# Patient Record
Sex: Male | Born: 1965 | Race: White | Hispanic: Yes | Smoking: Current every day smoker
Health system: Southern US, Community
[De-identification: ages and names within clinical notes are randomized; demographics above are authoritative.]

## PROBLEM LIST (undated history)

## (undated) DIAGNOSIS — F1010 Alcohol abuse, uncomplicated: Secondary | ICD-10-CM

## (undated) HISTORY — PX: NASAL SEPTUM SURGERY: SHX37

## (undated) HISTORY — PX: WRIST SURGERY: SHX841

---

## 2020-06-19 ENCOUNTER — Emergency Department (HOSPITAL_COMMUNITY)
Admission: EM | Admit: 2020-06-19 | Discharge: 2020-06-20 | Disposition: A | Discharge status: Home / Self Care | Payer: No Typology Code available for payment source | Attending: Emergency Medicine | Admitting: Emergency Medicine

## 2020-06-19 ENCOUNTER — Ambulatory Visit (HOSPITAL_COMMUNITY)
Admission: AD | Admit: 2020-06-19 | Discharge: 2020-06-19 | Disposition: A | Discharge status: Home / Self Care | Payer: No Typology Code available for payment source | Source: Home / Self Care | Attending: Psychiatry | Admitting: Psychiatry

## 2020-06-19 ENCOUNTER — Other Ambulatory Visit: Payer: Self-pay

## 2020-06-19 DIAGNOSIS — R2681 Unsteadiness on feet: Secondary | ICD-10-CM | POA: Insufficient documentation

## 2020-06-19 DIAGNOSIS — F329 Major depressive disorder, single episode, unspecified: Secondary | ICD-10-CM | POA: Insufficient documentation

## 2020-06-19 DIAGNOSIS — F10120 Alcohol abuse with intoxication, uncomplicated: Secondary | ICD-10-CM | POA: Insufficient documentation

## 2020-06-19 DIAGNOSIS — F1010 Alcohol abuse, uncomplicated: Secondary | ICD-10-CM

## 2020-06-19 DIAGNOSIS — R450 Nervousness: Secondary | ICD-10-CM | POA: Insufficient documentation

## 2020-06-19 DIAGNOSIS — R296 Repeated falls: Secondary | ICD-10-CM | POA: Insufficient documentation

## 2020-06-19 DIAGNOSIS — Z9181 History of falling: Secondary | ICD-10-CM | POA: Insufficient documentation

## 2020-06-19 DIAGNOSIS — Z0279 Encounter for issue of other medical certificate: Secondary | ICD-10-CM | POA: Diagnosis present

## 2020-06-19 DIAGNOSIS — F1020 Alcohol dependence, uncomplicated: Secondary | ICD-10-CM | POA: Diagnosis present

## 2020-06-19 DIAGNOSIS — F10129 Alcohol abuse with intoxication, unspecified: Secondary | ICD-10-CM

## 2020-06-19 MED ORDER — CHLORDIAZEPOXIDE HCL 25 MG PO CAPS: ORAL_CAPSULE | ORAL | 0 refills | Status: DC

## 2020-06-19 NOTE — ED Triage Notes (Addendum)
Patient Dylan Weaver - patient went to [facility] intoxicated., [facility] sent patient to  [facility] for medical clearance.   Patient states he is recently divorced, had to move back to [place] from [place]. Patient says he has started drinking more (wine and beer) and wants help to stop drinking

## 2020-06-19 NOTE — ED Notes (Signed)
Urine sample and urine culture sent to lab  

## 2020-06-19 NOTE — ED Notes (Signed)
Pt found smoking in fast track room.  Pt informed that it is against hospital policy to smoke on the property.  Cigarettes removed from room, placed in biohazard bag at nurses station.

## 2020-06-19 NOTE — H&P (Signed)
Behavioral Health Medical Screening Exam  Dylan Weaver is an 54 y.o. male.  Total Time spent with patient: 30 minutes   Patient seen and evaluated in person by this provider for alcohol detox.  He is intoxicated and unsteady, multiple falls with scabs on his knees.  Denies medical issues, drinks a 12 pack per day with a bottle of wine.  His foot needs to be xrayd after fall.  No suicidal/homicidal ideations, hallucinations, medications, or allergies.  Psychiatric Specialty Exam: Physical Exam Vitals and nursing note reviewed.  Constitutional:      Appearance: Normal appearance.  HENT:     Head: Normocephalic.     Nose: Nose normal.  Pulmonary:     Effort: Pulmonary effort is normal.  Musculoskeletal:        General: Swelling and signs of injury present.     Cervical back: Normal range of motion.  Neurological:     General: No focal deficit present.     Mental Status: He is alert and oriented to person, place, and time.  Psychiatric:        Attention and Perception: Attention and perception normal.        Mood and Affect: Mood is anxious and depressed.        Speech: Speech normal.        Behavior: Behavior normal. Behavior is cooperative.        Thought Content: Thought content normal.        Cognition and Memory: Cognition and memory normal.        Judgment: Judgment normal.    Review of Systems  Psychiatric/Behavioral: Positive for dysphoric mood. The patient is nervous/anxious.   All other systems reviewed and are negative.  Blood pressure (!) 135/99, pulse (!) 111, temperature 98.6 F (37 C), temperature source Oral, resp. rate 20.There is no height or weight on file to calculate BMI. General Appearance: Disheveled Eye Contact:  Fair Speech:  Normal Rate Volume:  Normal Mood:  Anxious and Depressed Affect:  Congruent Thought Process:  Coherent and Descriptions of Associations: Intact Orientation:  Full (Time, Place, and Person) Thought Content:   Logical Suicidal Thoughts:  No Homicidal Thoughts:  No Memory:  Immediate;   Fair Recent;   Poor Remote;   Fair Judgement:  Impaired Insight:  Lacking Psychomotor Activity:  Decreased Concentration: Concentration: Fair and Attention Span: Fair Recall:  [employer] of Knowledge:Fair Language: Fair Akathisia:  No Handed:  Right AIMS (if indicated):    Assets:  Leisure Time Resilience Social Support Sleep:     Musculoskeletal: Strength & Muscle Tone: decreased Gait & Station: unsteady Patient leans: N/A  Blood pressure (!) 135/99, pulse (!) 111, temperature 98.6 F (37 C), temperature source Oral, resp. rate 20.  Recommendations: Based on my evaluation the patient does not appear to have an emergency medical condition.  Nanine Means, NP 06/19/2020, 4:04 PM

## 2020-06-19 NOTE — BH Assessment (Signed)
Assessment Note  Dylan Weaver is an 54 y.o. male that presents this date actively impaired to [facility] as a walk in. Patient denies any S/I, H/I or AVH. Patient cannot render any detailed history due to AMS. Patient states he is presenting this date for alcohol detox. Patient per chart review has limited history. Patient states "No, No No" to all questions. Patient was noted to be unsteady in his gait and speaking with slurred speech. Lord DNP evaluated patient and writes: Patient seen and evaluated in person by this provider for alcohol detox. He is intoxicated and unsteady, multiple falls with scabs on his knees. Denies medical issues, drinks a 12 pack per day with a bottle of wine. His foot needs to be xrayd after fall. No suicidal/homicidal ideations, hallucinations, medications, or allergies. Patient was sent to [facility] for medical clearance and does not meet criteria for a mental health admission.     Diagnosis: Substance induced mood disorder.   Past Medical History: No past medical history on file.   Family History: No family history on file.  Social History:  has no history on file for tobacco use, alcohol use, and drug use.  Additional Social History:  Alcohol / Drug Use Pain Medications: See MAR Prescriptions: See MAR Over the Counter: See MAR History of alcohol / drug use?: Yes Longest period of sobriety (when/how long): Unknown Negative Consequences of Use:  (Denies) Withdrawal Symptoms:  (Denies) Substance #1 Name of Substance 1: Alcohol 1 - Age of First Use: 17 1 - Amount (size/oz): Varies 1 - Frequency: Varies 1 - Duration: Ongoing 1 - Last Use / Amount: 06/19/20 unknown amount  CIWA: CIWA-Ar BP: (!) 135/99 Pulse Rate: (!) 111 COWS:    Allergies: No Known Allergies  Home Medications: (Not in a hospital admission)   OB/GYN Status:  No LMP for male patient.  General Assessment Data Location of Assessment:  [facility] ) TTS Assessment: In system Is this a Tele  or Face-to-Face Assessment?: Face-to-Face Is this an Initial Assessment or a Re-assessment for this encounter?: Initial Assessment Patient Accompanied by:: N/A Language Other than English: No Living Arrangements: Other (Comment) What gender do you identify as?: Male Marital status: Divorced Living Arrangements: Alone Can pt return to current living arrangement?: Yes Admission Status: Voluntary Is patient capable of signing voluntary admission?: Yes Referral Source: Self/Family/Friend Insurance type: VA     Crisis Care Plan Living Arrangements: Alone Legal Guardian:  (NA) Name of Psychiatrist: None Name of Therapist: None  Education Status Is patient currently in school?: No Is the patient employed, unemployed or receiving disability?: Unemployed  Risk to self with the past 6 months Suicidal Ideation: No Has patient been a risk to self within the past 6 months prior to admission? : No Suicidal Intent: No Has patient had any suicidal intent within the past 6 months prior to admission? : No Is patient at risk for suicide?: No Suicidal Plan?: No Has patient had any suicidal plan within the past 6 months prior to admission? : No Access to Means: No What has been your use of drugs/alcohol within the last 12 months?: Current use Previous Attempts/Gestures: No How many times?: 0 Other Self Harm Risks:  (NA) Triggers for Past Attempts:  (NA) Intentional Self Injurious Behavior: None Family Suicide History: No Recent stressful life event(s):  (UTA) Persecutory voices/beliefs?: No Depression:  (UTA) Depression Symptoms:  (UTA) Substance abuse history and/or treatment for substance abuse?: No Suicide prevention information given to non-admitted patients: Not applicable  Risk to Others within the past 6 months Homicidal Ideation: No Does patient have any lifetime risk of violence toward others beyond the six months prior to admission? : No Thoughts of Harm to Others:  No Current Homicidal Intent: No Current Homicidal Plan: No Access to Homicidal Means: No Identified Victim: NA History of harm to others?: No Assessment of Violence: None Noted Violent Behavior Description: NA Does patient have access to weapons?: No Criminal Charges Pending?: No Does patient have a court date: No Is patient on probation?: No  Psychosis Hallucinations: None noted Delusions: None noted  Mental Status Report Appearance/Hygiene: Unremarkable Eye Contact: Poor Motor Activity: Agitation Speech: Slurred Level of Consciousness: Irritable Mood: Depressed Affect: Angry Anxiety Level: Minimal Thought Processes: Unable to Assess Judgement: Unable to Assess Orientation: Unable to assess Obsessive Compulsive Thoughts/Behaviors: Unable to Assess  Cognitive Functioning Concentration: Unable to Assess Memory: Unable to Assess Is patient IDD: No Insight: Unable to Assess Impulse Control: Unable to Assess Appetite:  (UTA) Have you had any weight changes? :  (UTA) Sleep:  (UTA) Total Hours of Sleep:  (UTA) Vegetative Symptoms: Unable to Assess  ADLScreening [facility] Assessment Services) Patient's cognitive ability adequate to safely complete daily activities?: Yes Patient able to express need for assistance with ADLs?: Yes Independently performs ADLs?: Yes (appropriate for developmental age)  Prior Inpatient Therapy Prior Inpatient Therapy: No  Prior Outpatient Therapy Prior Outpatient Therapy: No Does patient have an ACCT team?: No Does patient have Intensive In-House Services?  : No Does patient have [facility] services? : No Does patient have P4CC services?: No  ADL Screening (condition at time of admission) Patient's cognitive ability adequate to safely complete daily activities?: Yes Is the patient deaf or have difficulty hearing?: No Does the patient have difficulty seeing, even when wearing glasses/contacts?: No Does the patient have difficulty  concentrating, remembering, or making decisions?: No Patient able to express need for assistance with ADLs?: Yes Does the patient have difficulty dressing or bathing?: No Independently performs ADLs?: Yes (appropriate for developmental age) Does the patient have difficulty walking or climbing stairs?: No Weakness of Legs: None Weakness of Arms/Hands: None  Home Assistive Devices/Equipment Home Assistive Devices/Equipment: None  Therapy Consults (therapy consults require a physician order) PT Evaluation Needed: No OT Evalulation Needed: No SLP Evaluation Needed: No Abuse/Neglect Assessment (Assessment to be complete while patient is alone) Abuse/Neglect Assessment Can Be Completed: Yes Physical Abuse: Denies Verbal Abuse: Denies Sexual Abuse: Denies Exploitation of patient/patient's resources: Denies Self-Neglect: Denies Values / Beliefs Cultural Requests During Hospitalization: None Spiritual Requests During Hospitalization: None Consults Spiritual Care Consult Needed: No Transition of Care Team Consult Needed: No Advance Directives (For Healthcare) Does Patient Have a Medical Advance Directive?: No Would patient like information on creating a medical advance directive?: No - Patient declined          Disposition:  Disposition Initial Assessment Completed for this Encounter: Yes  On Site Evaluation by:   Reviewed with Physician:    Alfredia Ferguson 06/19/2020 6:18 PM

## 2020-06-19 NOTE — ED Notes (Signed)
Patient ate complete meal tray. Ambulated to and from bathroom several times.

## 2020-06-19 NOTE — ED Provider Notes (Signed)
[facility]-EMERGENCY DEPT Provider Note   CSN: [id number] Arrival date & time: 06/19/20  4166     History Chief Complaint  Patient presents with  . Medical Clearance    Dylan Weaver is a 54 y.o. male.  Patient is a 54 year old male with a history of alcohol abuse who reports that he recently went through a divorce and has moved back into the area and for the last few weeks has been drinking heavily.  He reports that he knows he needs to stop drinking and is interested in doing that.  When asked when he last had a drink he reports noon yesterday however he appears he denies any intoxicated here.  Pt denies SI or feelings of depression.  Pt is only interested in rehab.  He denies prior hx of DT or withdrawal.  Reports he had some vomiting earlier but denies feeling nauseated now.  The history is provided by the patient.       No past medical history on file.  Patient Active Problem List   Diagnosis Date Noted  . Alcohol use disorder, severe, dependence (HCC) 06/19/2020  . Alcohol abuse with intoxication (HCC) 06/19/2020         No family history on file.  Social History   Tobacco Use  . Smoking status: Not on file  Substance Use Topics  . Alcohol use: Not on file  . Drug use: Not on file    Home Medications Prior to Admission medications   Medication Sig Start Date End Date Taking? Authorizing Provider  chlordiazePOXIDE (LIBRIUM) 25 MG capsule 50mg  PO TID x 1D, then 25-50mg  PO BID X 1D, then 25-50mg  PO QD X 1D 06/19/20   06/21/20, MD    Allergies    Patient has no known allergies.  Review of Systems   Review of Systems  All other systems reviewed and are negative.   Physical Exam Updated Vital Signs BP (!) 118/91 (BP Location: Left Arm)   Pulse 86   Temp 98.8 F (37.1 C) (Oral)   Resp 18   Ht 5\' 5"  (1.651 m)   Wt 73.5 kg   SpO2 100%   BMI 26.96 kg/m   Physical Exam Vitals and nursing note reviewed.   Constitutional:      General: He is not in acute distress.    Appearance: He is well-developed and normal weight.     Comments: Smells of alcohol  HENT:     Head: Normocephalic and atraumatic.  Eyes:     Conjunctiva/sclera: Conjunctivae normal.     Pupils: Pupils are equal, round, and reactive to light.  Cardiovascular:     Rate and Rhythm: Normal rate and regular rhythm.     Pulses: Normal pulses.     Heart sounds: No murmur heard.   Pulmonary:     Effort: Pulmonary effort is normal. No respiratory distress.     Breath sounds: Normal breath sounds. No wheezing or rales.  Abdominal:     General: There is no distension.     Palpations: Abdomen is soft.     Tenderness: There is no abdominal tenderness. There is no guarding or rebound.  Musculoskeletal:        General: No tenderness. Normal range of motion.     Cervical back: Normal range of motion and neck supple.     Comments: Healing abrasions over bilateral knees  Skin:    General: Skin is warm and dry.     Findings: No  erythema or rash.  Neurological:     Mental Status: He is alert and oriented to person, place, and time.     Sensory: No sensory deficit.     Motor: No weakness.     Gait: Gait normal.  Psychiatric:        Mood and Affect: Mood normal.        Behavior: Behavior normal.        Thought Content: Thought content normal. Thought content does not include homicidal or suicidal ideation. Thought content does not include homicidal or suicidal plan.     ED Results / Procedures / Treatments   Labs (all labs ordered are listed, but only abnormal results are displayed) Labs Reviewed - No data to display  EKG None  Radiology No results found.  Procedures Procedures (including critical care time)  Medications Ordered in ED Medications - No data to display  ED Course  I have reviewed the triage vital signs and the nursing notes.  Pertinent labs & imaging results that were available during my care of the  patient were reviewed by me and considered in my medical decision making (see chart for details).    MDM Rules/Calculators/A&P                          Patient presenting to the emergency room requesting alcohol detox.  Patient denies any suicidal thoughts or depression.  Patient reports he has not had anything to drink for the last 12 hours however seems mildly intoxicated here.  However patient is eating walking around and in no acute distress.  Discussed with him that we could provide resources but we do not have specific detox at our facility.  Patient does not appear to be a threat to himself or others.  He appears stable for discharge.  He was given resources and a prescription for Librium however he did report that he does not typically have severe withdrawal if he stops drinking.  Final Clinical Impression(s) / ED Diagnoses Final diagnoses:  Alcohol abuse    Rx / DC Orders ED Discharge Orders         Ordered    chlordiazePOXIDE (LIBRIUM) 25 MG capsule        06/19/20 1913           Gwyneth Sprout, MD 06/19/20 2014

## 2020-06-19 NOTE — Discharge Instructions (Addendum)
You can call the above resources for help with your alcohol problem.

## 2020-06-21 ENCOUNTER — Emergency Department (HOSPITAL_BASED_OUTPATIENT_CLINIC_OR_DEPARTMENT_OTHER): Payer: No Typology Code available for payment source

## 2020-06-21 ENCOUNTER — Emergency Department (HOSPITAL_BASED_OUTPATIENT_CLINIC_OR_DEPARTMENT_OTHER)
Admission: EM | Admit: 2020-06-21 | Discharge: 2020-06-21 | Disposition: A | Discharge status: Home / Self Care | Payer: No Typology Code available for payment source | Attending: Emergency Medicine | Admitting: Emergency Medicine

## 2020-06-21 ENCOUNTER — Encounter (HOSPITAL_BASED_OUTPATIENT_CLINIC_OR_DEPARTMENT_OTHER): Payer: Self-pay | Admitting: *Deleted

## 2020-06-21 ENCOUNTER — Other Ambulatory Visit: Payer: Self-pay

## 2020-06-21 DIAGNOSIS — F1010 Alcohol abuse, uncomplicated: Secondary | ICD-10-CM | POA: Diagnosis not present

## 2020-06-21 DIAGNOSIS — S80211A Abrasion, right knee, initial encounter: Secondary | ICD-10-CM | POA: Diagnosis not present

## 2020-06-21 DIAGNOSIS — W19XXXA Unspecified fall, initial encounter: Secondary | ICD-10-CM

## 2020-06-21 DIAGNOSIS — S59901A Unspecified injury of right elbow, initial encounter: Secondary | ICD-10-CM | POA: Diagnosis present

## 2020-06-21 DIAGNOSIS — W1839XA Other fall on same level, initial encounter: Secondary | ICD-10-CM | POA: Insufficient documentation

## 2020-06-21 DIAGNOSIS — Z23 Encounter for immunization: Secondary | ICD-10-CM | POA: Insufficient documentation

## 2020-06-21 DIAGNOSIS — F17200 Nicotine dependence, unspecified, uncomplicated: Secondary | ICD-10-CM | POA: Insufficient documentation

## 2020-06-21 DIAGNOSIS — S80212A Abrasion, left knee, initial encounter: Secondary | ICD-10-CM | POA: Insufficient documentation

## 2020-06-21 DIAGNOSIS — S51011A Laceration without foreign body of right elbow, initial encounter: Secondary | ICD-10-CM | POA: Diagnosis not present

## 2020-06-21 MED ORDER — TETANUS-DIPHTH-ACELL PERTUSSIS 5-2.5-18.5 LF-MCG/0.5 IM SUSP
0.5000 mL | Freq: Once | INTRAMUSCULAR | Status: AC
  Administered 2020-06-21: 0.5 mL via INTRAMUSCULAR
  Filled 2020-06-21: qty 0.5

## 2020-06-21 NOTE — ED Triage Notes (Addendum)
States his legs got wobbly from drinking too much and he fell tonight. Injury to both knees. Dried blood noted. He is cooperative. Feels shaky. States he drank 5 beer today. Heart rate noted.

## 2020-06-21 NOTE — ED Notes (Signed)
Irrigated and dressed right and left knee abrasions. Irrigated right elbow. Pt tolerated well.

## 2020-06-21 NOTE — Discharge Instructions (Addendum)
Please keep your wounds clean and covered.  The glue to your right elbow will fall off on its own. Return to the emergency department if your wounds begin to drain pus, and become much more swollen and red and more painful.

## 2020-06-21 NOTE — ED Provider Notes (Signed)
[facility] EMERGENCY DEPARTMENT Provider Note   CSN: [id number] Arrival date & time: 06/21/20  1742     History Chief Complaint  Patient presents with  . Fall  . Alcohol Intoxication    Dylan Weaver is a 54 y.o. male w PMHx alcohol abuse presenting to the ED with complaint of fall. Patient states he lost his balance today due to alcohol use and fell forward onto his knees. He did not hit his head or lose consciousness. He states he was holding a bottle of wine when he fell and that broke. He states he had about 5 drinks today, though usually drinks 10-15 per day. He denies pain to his knee or elbow where he has abrasions. He denies HA, CP, abd pain. Denies symptoms of alcohol withdrawal.   The history is provided by the patient.       History reviewed. No pertinent past medical history.  Patient Active Problem List   Diagnosis Date Noted  . Alcohol use disorder, severe, dependence (HCC) 06/19/2020  . Alcohol abuse with intoxication (HCC) 06/19/2020    History reviewed. No pertinent surgical history.     No family history on file.  Social History   Tobacco Use  . Smoking status: Current Every Day Smoker  . Smokeless tobacco: Never Used  Substance Use Topics  . Alcohol use: Yes  . Drug use: Never    Home Medications Prior to Admission medications   Medication Sig Start Date End Date Taking? Authorizing Provider  chlordiazePOXIDE (LIBRIUM) 25 MG capsule 50mg  PO TID x 1D, then 25-50mg  PO BID X 1D, then 25-50mg  PO QD X 1D 06/19/20   06/21/20, MD    Allergies    Patient has no known allergies.  Review of Systems   Review of Systems  Musculoskeletal: Negative for arthralgias.  Skin: Positive for wound.  All other systems reviewed and are negative.   Physical Exam Updated Vital Signs BP 125/88   Pulse 85   Temp 98.5 F (36.9 C) (Oral)   Resp 16   Ht 6' (1.829 m)   Wt 81.6 kg   SpO2 99%   BMI 24.41 kg/m   Physical  Exam Vitals and nursing note reviewed.  Constitutional:      General: He is not in acute distress.    Appearance: He is well-developed.     Comments: Patient is disheveled with poor hygiene  HENT:     Head: Normocephalic and atraumatic.     Comments: No scalp hematoma or evidence of trauma. Eyes:     Conjunctiva/sclera: Conjunctivae normal.  Cardiovascular:     Rate and Rhythm: Normal rate and regular rhythm.  Pulmonary:     Effort: Pulmonary effort is normal. No respiratory distress.     Breath sounds: Normal breath sounds.     Comments: bruises present to the anterior chest wall.  No crepitus or deformity.  Symmetric chest expansion. Abdominal:     General: Bowel sounds are normal.     Palpations: Abdomen is soft.     Tenderness: There is no abdominal tenderness. There is no left CVA tenderness or guarding.  Musculoskeletal:     Cervical back: Normal range of motion.     Comments: There is a 1 cm laceration to the right elbow at the olecranon.  No foreign bodies visualized at the base of the wound.  Elbow without swelling or deformity.  No tenderness, full normal range of motion of the elbow without pain. Superficial abrasions  to bilateral knees.  No swelling, bruising or deformity.  Full normal range of motion of the knees without pain. Wounds are not grossly contaminated.  Skin:    General: Skin is warm.  Neurological:     Mental Status: He is alert.     Comments: Patient is alert and appropriate.  He is able to provide adequate history, ambulates with steady gait.  Psychiatric:        Behavior: Behavior normal.     ED Results / Procedures / Treatments   Labs (all labs ordered are listed, but only abnormal results are displayed) Labs Reviewed - No data to display  EKG None  Radiology DG Knee Complete 4 Views Left  Result Date: 06/21/2020 CLINICAL DATA:  Larey Seat EXAM: LEFT KNEE - COMPLETE 4+ VIEW COMPARISON:  None. FINDINGS: Frontal, bilateral oblique, lateral views of  the left knee are obtained. No fracture, subluxation, or dislocation. Joint spaces are well preserved. No joint effusion. IMPRESSION: 1. Unremarkable left knee. Electronically Signed   By: Sharlet Salina M.D.   On: 06/21/2020 18:53   DG Knee Complete 4 Views Right  Result Date: 06/21/2020 CLINICAL DATA:  Larey Seat EXAM: RIGHT KNEE - COMPLETE 4+ VIEW COMPARISON:  None. FINDINGS: Frontal, bilateral oblique, lateral views of the right knee are obtained. No fracture, subluxation, or dislocation. Joint spaces are well preserved. No joint effusion. IMPRESSION: 1. Unremarkable right knee. Electronically Signed   By: Sharlet Salina M.D.   On: 06/21/2020 18:54    Procedures .Marland KitchenLaceration Repair  Date/Time: 06/21/2020 11:09 PM Performed by: Patrick Sohm, [place] N, PA-C Authorized by: Sy Saintjean, [place] N, PA-C   Consent:    Consent obtained:  Verbal   Consent given by:  Patient   Risks discussed:  Pain and infection Anesthesia (see MAR for exact dosages):    Anesthesia method:  None Laceration details:    Location:  Shoulder/arm   Shoulder/arm location:  R elbow   Length (cm):  1 Repair type:    Repair type:  Simple Pre-procedure details:    Preparation:  Patient was prepped and draped in usual sterile fashion Exploration:    Hemostasis achieved with:  Direct pressure   Wound exploration: wound explored through full range of motion and entire depth of wound probed and visualized     Wound extent: no foreign bodies/material noted     Contaminated: no   Treatment:    Area cleansed with:  Saline   Amount of cleaning:  Extensive   Visualized foreign bodies/material removed: no   Skin repair:    Repair method:  Tissue adhesive Approximation:    Approximation:  Close Post-procedure details:    Dressing:  Open (no dressing)   Patient tolerance of procedure:  Tolerated well, no immediate complications   (including critical care time)  Medications Ordered in ED Medications  Tdap (BOOSTRIX) injection  0.5 mL (0.5 mLs Intramuscular Given 06/21/20 2200)    ED Course  I have reviewed the triage vital signs and the nursing notes.  Pertinent labs & imaging results that were available during my care of the patient were reviewed by me and considered in my medical decision making (see chart for details).    MDM Rules/Calculators/A&P                          Patient with superficial abrasions to bilateral knees and laceration to right elbow after mechanical fall earlier today in the setting of alcohol use.  He is  able to provide adequate history regarding the fall, and denies head trauma.  No evidence of head trauma on exam, he is alert and appropriate.  He does have some bruises to the anterior chest though denies pain or tenderness.  Heart and lung sounds are normal.  He declines chest x-ray dating he did not want to go back to the x-ray department after receiving films from his knees in triage.  His knee films are negative.  Wounds were irrigated at bedside, Tdap updated.  Laceration to elbow was closed with Dermabond.  Wounds to knees were dressed.  He is instructed of wound care and return precautions.  Patient is ambulatory with steady gait, appropriate for discharge.  Discussed results, findings, treatment and follow up. Patient advised of return precautions. Patient verbalized understanding and agreed with plan.  Final Clinical Impression(s) / ED Diagnoses Final diagnoses:  Fall, initial encounter  Laceration of right elbow, initial encounter  Alcohol abuse    Rx / DC Orders ED Discharge Orders    None       Roi Jafari, [place] N, PA-C 06/21/20 2312    Rolan Bucco, MD 06/21/20 [phone]

## 2020-09-14 ENCOUNTER — Encounter (HOSPITAL_COMMUNITY): Payer: Self-pay

## 2020-09-14 ENCOUNTER — Other Ambulatory Visit: Payer: Self-pay

## 2020-09-14 ENCOUNTER — Emergency Department (HOSPITAL_COMMUNITY): Payer: No Typology Code available for payment source

## 2020-09-14 ENCOUNTER — Inpatient Hospital Stay (HOSPITAL_COMMUNITY)
Admission: EM | Admit: 2020-09-14 | Discharge: 2020-09-24 | DRG: 896 | Disposition: A | Discharge status: Home / Self Care | Payer: No Typology Code available for payment source | Attending: Internal Medicine | Admitting: Internal Medicine

## 2020-09-14 DIAGNOSIS — E876 Hypokalemia: Secondary | ICD-10-CM | POA: Diagnosis present

## 2020-09-14 DIAGNOSIS — B961 Klebsiella pneumoniae [K. pneumoniae] as the cause of diseases classified elsewhere: Secondary | ICD-10-CM | POA: Diagnosis present

## 2020-09-14 DIAGNOSIS — E440 Moderate protein-calorie malnutrition: Secondary | ICD-10-CM | POA: Insufficient documentation

## 2020-09-14 DIAGNOSIS — Z6823 Body mass index (BMI) 23.0-23.9, adult: Secondary | ICD-10-CM

## 2020-09-14 DIAGNOSIS — F10239 Alcohol dependence with withdrawal, unspecified: Principal | ICD-10-CM | POA: Diagnosis present

## 2020-09-14 DIAGNOSIS — R531 Weakness: Secondary | ICD-10-CM | POA: Diagnosis not present

## 2020-09-14 DIAGNOSIS — F10939 Alcohol use, unspecified with withdrawal, unspecified: Secondary | ICD-10-CM | POA: Diagnosis present

## 2020-09-14 DIAGNOSIS — F10229 Alcohol dependence with intoxication, unspecified: Secondary | ICD-10-CM | POA: Diagnosis present

## 2020-09-14 DIAGNOSIS — N390 Urinary tract infection, site not specified: Secondary | ICD-10-CM | POA: Diagnosis present

## 2020-09-14 DIAGNOSIS — F1010 Alcohol abuse, uncomplicated: Secondary | ICD-10-CM

## 2020-09-14 DIAGNOSIS — Z1611 Resistance to penicillins: Secondary | ICD-10-CM | POA: Diagnosis present

## 2020-09-14 DIAGNOSIS — Z20822 Contact with and (suspected) exposure to covid-19: Secondary | ICD-10-CM | POA: Diagnosis present

## 2020-09-14 DIAGNOSIS — W08XXXA Fall from other furniture, initial encounter: Secondary | ICD-10-CM | POA: Diagnosis present

## 2020-09-14 DIAGNOSIS — R7989 Other specified abnormal findings of blood chemistry: Secondary | ICD-10-CM | POA: Diagnosis present

## 2020-09-14 DIAGNOSIS — Z1629 Resistance to other single specified antibiotic: Secondary | ICD-10-CM | POA: Diagnosis present

## 2020-09-14 DIAGNOSIS — R748 Abnormal levels of other serum enzymes: Secondary | ICD-10-CM | POA: Diagnosis present

## 2020-09-14 DIAGNOSIS — E860 Dehydration: Secondary | ICD-10-CM | POA: Diagnosis present

## 2020-09-14 DIAGNOSIS — D649 Anemia, unspecified: Secondary | ICD-10-CM | POA: Diagnosis present

## 2020-09-14 DIAGNOSIS — E222 Syndrome of inappropriate secretion of antidiuretic hormone: Secondary | ICD-10-CM | POA: Diagnosis present

## 2020-09-14 DIAGNOSIS — D696 Thrombocytopenia, unspecified: Secondary | ICD-10-CM | POA: Diagnosis present

## 2020-09-14 DIAGNOSIS — K7010 Alcoholic hepatitis without ascites: Secondary | ICD-10-CM | POA: Diagnosis present

## 2020-09-14 DIAGNOSIS — D61818 Other pancytopenia: Secondary | ICD-10-CM | POA: Diagnosis present

## 2020-09-14 DIAGNOSIS — G928 Other toxic encephalopathy: Secondary | ICD-10-CM | POA: Diagnosis not present

## 2020-09-14 DIAGNOSIS — D6959 Other secondary thrombocytopenia: Secondary | ICD-10-CM | POA: Diagnosis present

## 2020-09-14 LAB — RAPID URINE DRUG SCREEN, HOSP PERFORMED
Amphetamines: NOT DETECTED
Barbiturates: NOT DETECTED
Benzodiazepines: NOT DETECTED
Cocaine: NOT DETECTED
Opiates: NOT DETECTED
Tetrahydrocannabinol: NOT DETECTED

## 2020-09-14 LAB — URINALYSIS, ROUTINE W REFLEX MICROSCOPIC
Bilirubin Urine: NEGATIVE
Glucose, UA: NEGATIVE mg/dL
Ketones, ur: 5 mg/dL — AB
Nitrite: NEGATIVE
Protein, ur: 100 mg/dL — AB
RBC / HPF: >50 RBC/hpf — ABNORMAL HIGH (ref 0–5)
Specific Gravity, Urine: 1.013 (ref 1.005–1.030)
WBC, UA: >50 WBC/hpf — ABNORMAL HIGH (ref 0–5)
pH: 8 (ref 5.0–8.0)

## 2020-09-14 LAB — COMPREHENSIVE METABOLIC PANEL
ALT: 58 U/L — ABNORMAL HIGH (ref 0–44)
AST: 218 U/L — ABNORMAL HIGH (ref 15–41)
Albumin: 3.3 g/dL — ABNORMAL LOW (ref 3.5–5.0)
Alkaline Phosphatase: 436 U/L — ABNORMAL HIGH (ref 38–126)
Anion gap: 18 — ABNORMAL HIGH (ref 5–15)
BUN: <5 mg/dL — ABNORMAL LOW (ref 6–20)
CO2: 21 mmol/L — ABNORMAL LOW (ref 22–32)
Calcium: 9 mg/dL (ref 8.9–10.3)
Chloride: 90 mmol/L — ABNORMAL LOW (ref 98–111)
Creatinine, Ser: 0.76 mg/dL (ref 0.61–1.24)
GFR, Estimated: >60 mL/min (ref >60)
Glucose, Bld: 126 mg/dL — ABNORMAL HIGH (ref 70–99)
Potassium: 3.9 mmol/L (ref 3.5–5.1)
Sodium: 129 mmol/L — ABNORMAL LOW (ref 135–145)
Total Bilirubin: 2.9 mg/dL — ABNORMAL HIGH (ref 0.3–1.2)
Total Protein: 9.4 g/dL — ABNORMAL HIGH (ref 6.5–8.1)

## 2020-09-14 LAB — CBC WITH DIFFERENTIAL/PLATELET
Abs Immature Granulocytes: 0.07 10*3/uL (ref 0.00–0.07)
Basophils Absolute: 0 10*3/uL (ref 0.0–0.1)
Basophils Relative: 0 %
Eosinophils Absolute: 0 10*3/uL (ref 0.0–0.5)
Eosinophils Relative: 0 %
HCT: 37.7 % — ABNORMAL LOW (ref 39.0–52.0)
Hemoglobin: 12.7 g/dL — ABNORMAL LOW (ref 13.0–17.0)
Immature Granulocytes: 1 %
Lymphocytes Relative: 3 %
Lymphs Abs: 0.4 10*3/uL — ABNORMAL LOW (ref 0.7–4.0)
MCH: 29.6 pg (ref 26.0–34.0)
MCHC: 33.7 g/dL (ref 30.0–36.0)
MCV: 87.9 fL (ref 80.0–100.0)
Monocytes Absolute: 0.5 10*3/uL (ref 0.1–1.0)
Monocytes Relative: 5 %
Neutro Abs: 10.4 10*3/uL — ABNORMAL HIGH (ref 1.7–7.7)
Neutrophils Relative %: 91 %
Platelets: 56 10*3/uL — ABNORMAL LOW (ref 150–400)
RBC: 4.29 MIL/uL (ref 4.22–5.81)
RDW: 16.4 % — ABNORMAL HIGH (ref 11.5–15.5)
WBC: 11.4 10*3/uL — ABNORMAL HIGH (ref 4.0–10.5)
nRBC: 0 % (ref 0.0–0.2)

## 2020-09-14 LAB — RESP PANEL BY RT-PCR (FLU A&B, COVID) ARPGX2
Influenza A by PCR: NEGATIVE
Influenza B by PCR: NEGATIVE
SARS Coronavirus 2 by RT PCR: NEGATIVE

## 2020-09-14 LAB — RETICULOCYTES
Immature Retic Fract: 21.2 % — ABNORMAL HIGH (ref 2.3–15.9)
RBC.: 4.23 MIL/uL (ref 4.22–5.81)
Retic Count, Absolute: 66.8 10*3/uL (ref 19.0–186.0)
Retic Ct Pct: 1.6 % (ref 0.4–3.1)

## 2020-09-14 LAB — ETHANOL: Alcohol, Ethyl (B): <10 mg/dL (ref <10)

## 2020-09-14 LAB — CK: Total CK: 130 U/L (ref 49–397)

## 2020-09-14 LAB — TSH: TSH: 1.556 u[IU]/mL (ref 0.350–4.500)

## 2020-09-14 MED ORDER — HYDROCORTISONE 0.5 % EX CREA
TOPICAL_CREAM | Freq: Once | CUTANEOUS | Status: AC
  Filled 2020-09-14 (×2): qty 28.35

## 2020-09-14 MED ORDER — INFLUENZA VAC SPLIT QUAD 0.5 ML IM SUSY: 0.5000 mL | PREFILLED_SYRINGE | INTRAMUSCULAR | Status: DC

## 2020-09-14 MED ORDER — ADULT MULTIVITAMIN W/MINERALS CH
1.0000 | ORAL_TABLET | Freq: Every day | ORAL | Status: DC
  Administered 2020-09-15 – 2020-09-23 (×9): 1 via ORAL
  Filled 2020-09-14 (×9): qty 1

## 2020-09-14 MED ORDER — SODIUM CHLORIDE 0.9 % IV SOLN: INTRAVENOUS | Status: AC

## 2020-09-14 MED ORDER — THIAMINE HCL 100 MG/ML IJ SOLN
100.0000 mg | Freq: Every day | INTRAMUSCULAR | Status: DC
  Filled 2020-09-14 (×2): qty 2

## 2020-09-14 MED ORDER — LORAZEPAM 1 MG PO TABS
1.0000 mg | ORAL_TABLET | ORAL | Status: AC | PRN
  Administered 2020-09-15 (×2): 1 mg via ORAL
  Administered 2020-09-17: 21:00:00 2 mg via ORAL
  Filled 2020-09-14: qty 2
  Filled 2020-09-14: qty 1

## 2020-09-14 MED ORDER — SODIUM CHLORIDE 0.9 % IV SOLN
1.0000 g | Freq: Once | INTRAVENOUS | Status: AC
  Administered 2020-09-14: 20:00:00 1 g via INTRAVENOUS
  Filled 2020-09-14: qty 10

## 2020-09-14 MED ORDER — LORAZEPAM 2 MG/ML IJ SOLN
1.0000 mg | INTRAMUSCULAR | Status: AC | PRN
  Administered 2020-09-16 – 2020-09-17 (×2): 2 mg via INTRAVENOUS
  Filled 2020-09-14 (×3): qty 1

## 2020-09-14 MED ORDER — SODIUM CHLORIDE 0.9 % IV BOLUS
1000.0000 mL | Freq: Once | INTRAVENOUS | Status: AC
  Administered 2020-09-14: 16:00:00 1000 mL via INTRAVENOUS

## 2020-09-14 MED ORDER — LORAZEPAM 2 MG/ML IJ SOLN
1.0000 mg | Freq: Once | INTRAMUSCULAR | Status: AC
  Administered 2020-09-14: 18:00:00 1 mg via INTRAVENOUS
  Filled 2020-09-14: qty 1

## 2020-09-14 MED ORDER — LORAZEPAM 2 MG/ML IJ SOLN
0.0000 mg | Freq: Two times a day (BID) | INTRAMUSCULAR | Status: AC
  Administered 2020-09-16: 23:00:00 2 mg via INTRAVENOUS
  Administered 2020-09-18: 11:00:00 1 mg via INTRAVENOUS
  Filled 2020-09-14 (×4): qty 1

## 2020-09-14 MED ORDER — THIAMINE HCL 100 MG PO TABS
100.0000 mg | ORAL_TABLET | Freq: Every day | ORAL | Status: DC
  Administered 2020-09-15 – 2020-09-23 (×9): 100 mg via ORAL
  Filled 2020-09-14 (×9): qty 1

## 2020-09-14 MED ORDER — LORAZEPAM 2 MG/ML IJ SOLN
0.0000 mg | Freq: Four times a day (QID) | INTRAMUSCULAR | Status: AC
  Administered 2020-09-15: 01:00:00 1 mg via INTRAVENOUS
  Administered 2020-09-15 (×2): 2 mg via INTRAVENOUS
  Administered 2020-09-15: 05:00:00 1 mg via INTRAVENOUS
  Administered 2020-09-16: 05:00:00 2 mg via INTRAVENOUS
  Filled 2020-09-14 (×4): qty 1

## 2020-09-14 MED ORDER — SODIUM CHLORIDE 0.9 % IV SOLN
1.0000 g | INTRAVENOUS | Status: DC
  Administered 2020-09-15 – 2020-09-16 (×2): 1 g via INTRAVENOUS
  Filled 2020-09-14: qty 1
  Filled 2020-09-14: qty 10

## 2020-09-14 MED ORDER — FOLIC ACID 1 MG PO TABS
1.0000 mg | ORAL_TABLET | Freq: Every day | ORAL | Status: DC
  Administered 2020-09-15 – 2020-09-23 (×9): 1 mg via ORAL
  Filled 2020-09-14 (×9): qty 1

## 2020-09-14 NOTE — ED Notes (Signed)
With assistance of provider and NT, attempted to get pt to stand and ambulate. Pt very unsteady on feet when brought to standing position. Unable to ambulate pt safely at this time.

## 2020-09-14 NOTE — ED Provider Notes (Signed)
[facility] Note   CSN: [id number] Arrival date & time: 09/14/20  1444     History Chief Complaint  Patient presents with  . Alcohol Intoxication    Dylan Weaver is a 54 y.o. male.  Patient has a history of drinking alcohol.  He fell off the couch and has not been able to get up.  Patient feels very unsteady  The history is provided by the patient and medical records. No language interpreter was used.  Alcohol Intoxication This is a chronic problem. The current episode started more than 1 week ago. The problem occurs constantly. The problem has not changed since onset.Pertinent negatives include no chest pain, no abdominal pain and no headaches. Nothing aggravates the symptoms. Nothing relieves the symptoms. He has tried nothing for the symptoms. The treatment provided no relief.       History reviewed. No pertinent past medical history.  Patient Active Problem List   Diagnosis Date Noted  . Alcohol withdrawal (HCC) 09/14/2020  . Alcohol use disorder, severe, dependence (HCC) 06/19/2020  . Alcohol abuse with intoxication (HCC) 06/19/2020    History reviewed. No pertinent surgical history.     History reviewed. No pertinent family history.  Social History   Tobacco Use  . Smoking status: Current Every Day Smoker  . Smokeless tobacco: Never Used  Substance Use Topics  . Alcohol use: Yes  . Drug use: Never    Home Medications Prior to Admission medications   Medication Sig Start Date End Date Taking? Authorizing Provider  aspirin EC 325 MG tablet Take 325 mg by mouth every 6 (six) hours as needed for moderate pain.   Yes [provider]  chlordiazePOXIDE (LIBRIUM) 25 MG capsule 50mg  PO TID x 1D, then 25-50mg  PO BID X 1D, then 25-50mg  PO QD X 1D Patient not taking: Reported on 09/14/2020 06/19/20   06/21/20, MD    Allergies    Patient has no known allergies.  Review of Systems   Review  of Systems  Constitutional: Negative for appetite change and fatigue.  HENT: Negative for congestion, ear discharge and sinus pressure.   Eyes: Negative for discharge.  Respiratory: Negative for cough.   Cardiovascular: Negative for chest pain.  Gastrointestinal: Negative for abdominal pain and diarrhea.  Genitourinary: Negative for frequency and hematuria.  Musculoskeletal: Negative for back pain.  Skin: Negative for rash.  Neurological: Negative for seizures and headaches.  Psychiatric/Behavioral: Positive for agitation. Negative for hallucinations.    Physical Exam Updated Vital Signs BP 132/85   Pulse (!) 106   Temp 98.6 F (37 C) (Oral)   Resp (!) 29   SpO2 96%   Physical Exam Nursing note reviewed.  Constitutional:      Appearance: He is well-developed.  HENT:     Head: Normocephalic.     Nose: Nose normal.  Eyes:     General: No scleral icterus.    Extraocular Movements: EOM normal.     Conjunctiva/sclera: Conjunctivae normal.  Neck:     Thyroid: No thyromegaly.  Cardiovascular:     Rate and Rhythm: Normal rate and regular rhythm.     Heart sounds: No murmur heard. No friction rub. No gallop.   Pulmonary:     Breath sounds: No stridor. No wheezing or rales.  Chest:     Chest wall: No tenderness.  Abdominal:     General: There is no distension.     Tenderness: There is no abdominal tenderness. There is  no rebound.  Musculoskeletal:        General: No edema. Normal range of motion.     Cervical back: Neck supple.     Comments: Patient is shaking a lot when he stands and feels unsteady when he walks  Lymphadenopathy:     Cervical: No cervical adenopathy.  Skin:    Findings: No erythema or rash.  Neurological:     Mental Status: He is alert and oriented to person, place, and time.     Motor: No abnormal muscle tone.     Coordination: Coordination normal.  Psychiatric:        Mood and Affect: Mood and affect normal.     Comments: Patient very anxious      ED Results / Procedures / Treatments   Labs (all labs ordered are listed, but only abnormal results are displayed) Labs Reviewed  CBC WITH DIFFERENTIAL/PLATELET - Abnormal; Notable for the following components:      Result Value   WBC 11.4 (*)    Hemoglobin 12.7 (*)    HCT 37.7 (*)    RDW 16.4 (*)    Platelets 56 (*)    Neutro Abs 10.4 (*)    Lymphs Abs 0.4 (*)    All other components within normal limits  COMPREHENSIVE METABOLIC PANEL - Abnormal; Notable for the following components:   Sodium 129 (*)    Chloride 90 (*)    CO2 21 (*)    Glucose, Bld 126 (*)    BUN <5 (*)    Total Protein 9.4 (*)    Albumin 3.3 (*)    AST 218 (*)    ALT 58 (*)    Alkaline Phosphatase 436 (*)    Total Bilirubin 2.9 (*)    Anion gap 18 (*)    All other components within normal limits  URINALYSIS, ROUTINE W REFLEX MICROSCOPIC - Abnormal; Notable for the following components:   Color, Urine AMBER (*)    APPearance HAZY (*)    Hgb urine dipstick MODERATE (*)    Ketones, ur 5 (*)    Protein, ur 100 (*)    Leukocytes,Ua MODERATE (*)    RBC / HPF >50 (*)    WBC, UA >50 (*)    Bacteria, UA RARE (*)    All other components within normal limits  RESP PANEL BY RT-PCR (FLU A&B, COVID) [id number]  URINE CULTURE  ETHANOL  RAPID URINE DRUG SCREEN, HOSP PERFORMED  CK    EKG None  Radiology DG Chest 2 View  Result Date: 09/14/2020 CLINICAL DATA:  54 year old male with weakness. EXAM: CHEST - 2 VIEW COMPARISON:  None FINDINGS: The heart size and mediastinal contours are within normal limits. Both lungs are clear. The visualized skeletal structures are unremarkable. IMPRESSION: No active cardiopulmonary disease. Electronically Signed   By: Elgie Collard M.D.   On: 09/14/2020 16:42   DG Pelvis 1-2 Views  Result Date: 09/14/2020 CLINICAL DATA:  Weakness. Fall. EXAM: PELVIS - 1-2 VIEW COMPARISON:  None. FINDINGS: The cortical margins of the bony pelvis are intact. No fracture. Pubic symphysis  and sacroiliac joints are congruent. Both femoral heads are well-seated in the respective acetabula. IMPRESSION: No pelvic fracture. Electronically Signed   By: Narda Rutherford M.D.   On: 09/14/2020 16:42   CT Head Wo Contrast  Result Date: 09/14/2020 CLINICAL DATA:  Fall.  Alcohol abuse. EXAM: CT HEAD WITHOUT CONTRAST TECHNIQUE: Contiguous axial images were obtained from the base of the skull through the vertex  without intravenous contrast. COMPARISON:  None. FINDINGS: Brain: No evidence of acute infarction, hemorrhage, hydrocephalus, extra-axial collection or mass lesion/mass effect. Mild generalized cerebral atrophy, advanced for age. Vascular: Atherosclerotic vascular calcification of the carotid siphons. No hyperdense vessel. Skull: Normal. Negative for fracture or focal lesion. Sinuses/Orbits: No acute finding. Other: None. IMPRESSION: 1. No acute intracranial abnormality. 2. Age advanced cerebral atrophy. Electronically Signed   By: Obie Dredge M.D.   On: 09/14/2020 17:51    Procedures Procedures (including critical care time)  Medications Ordered in ED Medications  cefTRIAXone (ROCEPHIN) 1 g in sodium chloride 0.9 % 100 mL IVPB (1 g Intravenous New Bag/Given 09/14/20 2022)  hydrocortisone cream 0.5 % ( Topical Given 09/14/20 1759)  sodium chloride 0.9 % bolus 1,000 mL (0 mLs Intravenous Stopped 09/14/20 1751)  LORazepam (ATIVAN) injection 1 mg (1 mg Intravenous Given 09/14/20 1823)    ED Course  I have reviewed the triage vital signs and the nursing notes.  Pertinent labs & imaging results that were available during my care of the patient were reviewed by me and considered in my medical decision making (see chart for details).    MDM Rules/Calculators/A&P                         Patient with an alcohol withdrawal symptoms.  Patient also possibly has a urinary tract infection and elevated anion gap.  He will be admitted to medicine Final Clinical Impression(s) / ED  Diagnoses Final diagnoses:  ETOH abuse    Rx / DC Orders ED Discharge Orders    None       Bethann Berkshire, MD 09/14/20 2032

## 2020-09-14 NOTE — H&P (Addendum)
History and Physical    Dylan Weaver NWG:[id number] DOB: 30-Jun-1966 DOA: 09/14/2020  PCP: Patient, No Pcp Per  Patient coming from: Home.  Chief Complaint: Weakness.  Difficulty walking.  HPI: Dylan Weaver is a 54 y.o. male with history of alcohol abuse patient states that over the last couple months he has been drinking more than usual after his divorce.  Denies any suicidal ideation denies taking any medications physically denies taking Tylenol states he has been feeling weak last couple of days last drink was more than 24 hours and has been having difficulty to walk because of the weakness.  Denies losing consciousness denies any chest pain shortness of breath has not been eating well.  Patient lives with his mother.  Ambulance was called and patient was on the floor in an unkempt situation.  ED Course: In the ER patient is mildly tachycardic labs show elevated LFTs sodium of 129 thrombocytopenia and mild normocytic normochromic anemia with mild leukocytosis UA concerning for UTI CT head was unremarkable.  X-ray chest and pelvis was unremarkable.  EKG shows sinus tachycardia.  Patient on exam is tremulous tachycardic and weak.  Patient was started on CIWA protocol admitted for alcohol withdrawal generalized weakness likely from dehydration and deconditioning.  Review of Systems: As per HPI, rest all negative.   History reviewed. No pertinent past medical history.  History reviewed. No pertinent surgical history.   reports that he has been smoking. He has never used smokeless tobacco. He reports current alcohol use. He reports that he does not use drugs.  No Known Allergies  Family History  Family history unknown: Yes    Prior to Admission medications   Medication Sig Start Date End Date Taking? Authorizing Provider  aspirin EC 325 MG tablet Take 325 mg by mouth every 6 (six) hours as needed for moderate pain.   Yes [provider]  chlordiazePOXIDE  (LIBRIUM) 25 MG capsule 50mg  PO TID x 1D, then 25-50mg  PO BID X 1D, then 25-50mg  PO QD X 1D Patient not taking: Reported on 09/14/2020 06/19/20   06/21/20, MD    Physical Exam: Constitutional: Moderately built and nourished. Vitals:   09/14/20 1930 09/14/20 1945 09/14/20 2000 09/14/20 2015  BP: 116/85 115/86 129/81 132/85  Pulse: (!) 109 (!) 115 (!) 129 (!) 106  Resp: (!) 24 (!) 25 18 (!) 29  Temp:      TempSrc:      SpO2: 96% 95% 94% 96%   Eyes: Anicteric no pallor. ENMT: No discharge from the ears eyes nose or mouth. Neck: No mass felt.  No neck rigidity. Respiratory: No rhonchi or crepitations. Cardiovascular: S1-S2 heard. Abdomen: Soft nontender bowel sounds present. Musculoskeletal: No edema. Skin: No rash. Neurologic: Alert awake oriented to time place and person.  Moves all extremities. Psychiatric: Appears normal.  Normal affect.  Denies any suicidal ideation.   Labs on Admission: I have personally reviewed following labs and imaging studies  CBC: Recent Labs  Lab 09/14/20 1607  WBC 11.4*  NEUTROABS 10.4*  HGB 12.7*  HCT 37.7*  MCV 87.9  PLT 56*   Basic Metabolic Panel: Recent Labs  Lab 09/14/20 1607  NA 129*  K 3.9  CL 90*  CO2 21*  GLUCOSE 126*  BUN <5*  CREATININE 0.76  CALCIUM 9.0   GFR: CrCl cannot be calculated (Unknown ideal weight.). Liver Function Tests: Recent Labs  Lab 09/14/20 1607  AST 218*  ALT 58*  ALKPHOS 436*  BILITOT 2.9*  PROT 9.4*  ALBUMIN 3.3*   No results for input(s): LIPASE, AMYLASE in the last 168 hours. No results for input(s): AMMONIA in the last 168 hours. Coagulation Profile: No results for input(s): INR, PROTIME in the last 168 hours. Cardiac Enzymes: Recent Labs  Lab 09/14/20 1607  CKTOTAL 130   BNP (last 3 results) No results for input(s): PROBNP in the last 8760 hours. HbA1C: No results for input(s): HGBA1C in the last 72 hours. CBG: No results for input(s): GLUCAP in the last 168  hours. Lipid Profile: No results for input(s): CHOL, HDL, LDLCALC, TRIG, CHOLHDL, LDLDIRECT in the last 72 hours. Thyroid Function Tests: No results for input(s): TSH, T4TOTAL, FREET4, T3FREE, THYROIDAB in the last 72 hours. Anemia Panel: No results for input(s): VITAMINB12, FOLATE, FERRITIN, TIBC, IRON, RETICCTPCT in the last 72 hours. Urine analysis:    Component Value Date/Time   COLORURINE AMBER (A) 09/14/2020 1607   APPEARANCEUR HAZY (A) 09/14/2020 1607   LABSPEC 1.013 09/14/2020 1607   PHURINE 8.0 09/14/2020 1607   GLUCOSEU NEGATIVE 09/14/2020 1607   HGBUR MODERATE (A) 09/14/2020 1607   BILIRUBINUR NEGATIVE 09/14/2020 1607   KETONESUR 5 (A) 09/14/2020 1607   PROTEINUR 100 (A) 09/14/2020 1607   NITRITE NEGATIVE 09/14/2020 1607   LEUKOCYTESUR MODERATE (A) 09/14/2020 1607   Sepsis Labs: @LABRCNTIP (procalcitonin:4,lacticidven:4) ) Recent Results (from the past 240 hour(s))  Resp Panel by RT-PCR (Flu A&B, Covid) Nasopharyngeal Swab     Status: None   Collection Time: 09/14/20  4:07 PM   Specimen: Nasopharyngeal Swab; Nasopharyngeal(NP) swabs in vial transport medium  Result Value Ref Range Status   SARS Coronavirus 2 by RT PCR NEGATIVE NEGATIVE Final    Comment: (NOTE) SARS-CoV-2 target nucleic acids are NOT DETECTED.  The SARS-CoV-2 RNA is generally detectable in upper respiratory specimens during the acute phase of infection. The lowest concentration of SARS-CoV-2 viral copies this assay can detect is 138 copies/mL. A negative result does not preclude SARS-Cov-2 infection and should not be used as the sole basis for treatment or other patient management decisions. A negative result may occur with  improper specimen collection/handling, submission of specimen other than nasopharyngeal swab, presence of viral mutation(s) within the areas targeted by this assay, and inadequate number of viral copies(<138 copies/mL). A negative result must be combined with clinical  observations, patient history, and epidemiological information. The expected result is Negative.  Fact Sheet for Patients:  09/16/20  Fact Sheet for Healthcare Providers:  BloggerCourse.com  This test is no t yet approved or cleared by the [employer] FDA and  has been authorized for detection and/or diagnosis of SARS-CoV-2 by FDA under an Emergency Use Authorization (EUA). This EUA will remain  in effect (meaning this test can be used) for the duration of the COVID-19 declaration under Section 564(b)(1) of the Act, 21 U.S.C.section 360bbb-3(b)(1), unless the authorization is terminated  or revoked sooner.       Influenza A by PCR NEGATIVE NEGATIVE Final   Influenza B by PCR NEGATIVE NEGATIVE Final    Comment: (NOTE) The Xpert Xpress SARS-CoV-2/FLU/RSV plus assay is intended as an aid in the diagnosis of influenza from Nasopharyngeal swab specimens and should not be used as a sole basis for treatment. Nasal washings and aspirates are unacceptable for Xpert Xpress SARS-CoV-2/FLU/RSV testing.  Fact Sheet for Patients: [place]  Fact Sheet for Healthcare Providers: BloggerCourse.com  This test is not yet approved or cleared by the [employer] FDA and has been authorized for detection and/or diagnosis  of SARS-CoV-2 by FDA under an Emergency Use Authorization (EUA). This EUA will remain in effect (meaning this test can be used) for the duration of the COVID-19 declaration under Section 564(b)(1) of the Act, 21 U.S.C. section 360bbb-3(b)(1), unless the authorization is terminated or revoked.  Performed at [facility], [address] [place], [place] [address]      Radiological Exams on Admission: DG Chest 2 View  Result Date: 09/14/2020 CLINICAL DATA:  54 year old male with weakness. EXAM: CHEST - 2 VIEW COMPARISON:  None FINDINGS: The  heart size and mediastinal contours are within normal limits. Both lungs are clear. The visualized skeletal structures are unremarkable. IMPRESSION: No active cardiopulmonary disease. Electronically Signed   By: Elgie Collard M.D.   On: 09/14/2020 16:42   DG Pelvis 1-2 Views  Result Date: 09/14/2020 CLINICAL DATA:  Weakness. Fall. EXAM: PELVIS - 1-2 VIEW COMPARISON:  None. FINDINGS: The cortical margins of the bony pelvis are intact. No fracture. Pubic symphysis and sacroiliac joints are congruent. Both femoral heads are well-seated in the respective acetabula. IMPRESSION: No pelvic fracture. Electronically Signed   By: Narda Rutherford M.D.   On: 09/14/2020 16:42   CT Head Wo Contrast  Result Date: 09/14/2020 CLINICAL DATA:  Fall.  Alcohol abuse. EXAM: CT HEAD WITHOUT CONTRAST TECHNIQUE: Contiguous axial images were obtained from the base of the skull through the vertex without intravenous contrast. COMPARISON:  None. FINDINGS: Brain: No evidence of acute infarction, hemorrhage, hydrocephalus, extra-axial collection or mass lesion/mass effect. Mild generalized cerebral atrophy, advanced for age. Vascular: Atherosclerotic vascular calcification of the carotid siphons. No hyperdense vessel. Skull: Normal. Negative for fracture or focal lesion. Sinuses/Orbits: No acute finding. Other: None. IMPRESSION: 1. No acute intracranial abnormality. 2. Age advanced cerebral atrophy. Electronically Signed   By: Obie Dredge M.D.   On: 09/14/2020 17:51    EKG: Independently reviewed.  Sinus tachycardia.  Assessment/Plan Principal Problem:   Alcohol withdrawal (HCC) Active Problems:   Alcoholic hepatitis   Thrombocytopenia (HCC)   Normochromic normocytic anemia    1. Alcohol withdrawal -patient has been placed on CIWA protocol social work consult we will closely monitor.  Denies drinking any other type of alcohol other than the regular beer and wine. 2. Generalized weakness with difficulty walking  could be from deconditioning.  He is able to move all extremities.  We will gently hydrate get physical therapy consult.  Check CT of the C-spine.  TSH and CK levels are normal. 3. Possible UTI on ceftriaxone.  Follow urine cultures. 4. Elevated LFTs likely from alcohol hepatitis.  Follow LFTs.  Check acute hepatitis panel.  Denies using any Tylenol.  Follow Tylenol levels and INR. 5. Normocytic normochromic anemia no old labs to compare.  Check anemia panel follow CBC. 6. Thrombocytopenia likely from alcoholism.  Follow CBC. 7. Natremia could be from alcohol abuse or dehydration.  Follow metabolic panel.  Check urine studies.   DVT prophylaxis: SCDs.  Has thrombocytopenia avoiding anticoagulation. Code Status: Full code. Family Communication: Discussed with patient. Disposition Plan: To be determined. Consults called: Social work. Admission status: Observation.   Eduard Clos MD [facility] Pager [phone].  If 7PM-7AM, please contact night-coverage [email or web address] Password [email or web address]  09/14/2020, 10:05 PM

## 2020-09-14 NOTE — ED Triage Notes (Signed)
Pt BIB EMS from home. Pt was found surrounded by empty bottles of liquor, wine, and beer. Per EMS pt is in alcohol withdrawals. Pt has tremors and vomiting. Pt reports drinking two beers today, but was found with empty beer case next to him. Pt called EMS today because he fell and was unable to get off the floor.   HR 140 BP 136/92 CBG 216 96% RA

## 2020-09-14 NOTE — ED Notes (Signed)
Pt provided with turkey sandwich and water

## 2020-09-14 NOTE — ED Notes (Signed)
Pt asleep and resting comfortably at this time.  

## 2020-09-15 ENCOUNTER — Observation Stay (HOSPITAL_COMMUNITY): Payer: No Typology Code available for payment source

## 2020-09-15 DIAGNOSIS — K7010 Alcoholic hepatitis without ascites: Secondary | ICD-10-CM | POA: Diagnosis present

## 2020-09-15 DIAGNOSIS — E860 Dehydration: Secondary | ICD-10-CM | POA: Diagnosis present

## 2020-09-15 DIAGNOSIS — D649 Anemia, unspecified: Secondary | ICD-10-CM

## 2020-09-15 DIAGNOSIS — R7989 Other specified abnormal findings of blood chemistry: Secondary | ICD-10-CM | POA: Diagnosis present

## 2020-09-15 DIAGNOSIS — D6959 Other secondary thrombocytopenia: Secondary | ICD-10-CM | POA: Diagnosis present

## 2020-09-15 DIAGNOSIS — R531 Weakness: Secondary | ICD-10-CM | POA: Diagnosis present

## 2020-09-15 DIAGNOSIS — E440 Moderate protein-calorie malnutrition: Secondary | ICD-10-CM | POA: Diagnosis present

## 2020-09-15 DIAGNOSIS — Z20822 Contact with and (suspected) exposure to covid-19: Secondary | ICD-10-CM | POA: Diagnosis present

## 2020-09-15 DIAGNOSIS — F10229 Alcohol dependence with intoxication, unspecified: Secondary | ICD-10-CM | POA: Diagnosis present

## 2020-09-15 DIAGNOSIS — Z1611 Resistance to penicillins: Secondary | ICD-10-CM | POA: Diagnosis present

## 2020-09-15 DIAGNOSIS — D61818 Other pancytopenia: Secondary | ICD-10-CM | POA: Diagnosis present

## 2020-09-15 DIAGNOSIS — Z1629 Resistance to other single specified antibiotic: Secondary | ICD-10-CM | POA: Diagnosis present

## 2020-09-15 DIAGNOSIS — F1010 Alcohol abuse, uncomplicated: Secondary | ICD-10-CM

## 2020-09-15 DIAGNOSIS — G928 Other toxic encephalopathy: Secondary | ICD-10-CM | POA: Diagnosis not present

## 2020-09-15 DIAGNOSIS — E222 Syndrome of inappropriate secretion of antidiuretic hormone: Secondary | ICD-10-CM | POA: Diagnosis present

## 2020-09-15 DIAGNOSIS — B961 Klebsiella pneumoniae [K. pneumoniae] as the cause of diseases classified elsewhere: Secondary | ICD-10-CM | POA: Diagnosis present

## 2020-09-15 DIAGNOSIS — E876 Hypokalemia: Secondary | ICD-10-CM | POA: Diagnosis present

## 2020-09-15 DIAGNOSIS — F10239 Alcohol dependence with withdrawal, unspecified: Secondary | ICD-10-CM | POA: Diagnosis present

## 2020-09-15 DIAGNOSIS — W08XXXA Fall from other furniture, initial encounter: Secondary | ICD-10-CM | POA: Diagnosis present

## 2020-09-15 DIAGNOSIS — R748 Abnormal levels of other serum enzymes: Secondary | ICD-10-CM | POA: Diagnosis present

## 2020-09-15 DIAGNOSIS — Z6823 Body mass index (BMI) 23.0-23.9, adult: Secondary | ICD-10-CM | POA: Diagnosis not present

## 2020-09-15 DIAGNOSIS — N390 Urinary tract infection, site not specified: Secondary | ICD-10-CM | POA: Diagnosis present

## 2020-09-15 LAB — HEPATIC FUNCTION PANEL
ALT: 51 U/L — ABNORMAL HIGH (ref 0–44)
AST: 173 U/L — ABNORMAL HIGH (ref 15–41)
Albumin: 3 g/dL — ABNORMAL LOW (ref 3.5–5.0)
Alkaline Phosphatase: 342 U/L — ABNORMAL HIGH (ref 38–126)
Bilirubin, Direct: 2.1 mg/dL — ABNORMAL HIGH (ref 0.0–0.2)
Indirect Bilirubin: 1.4 mg/dL — ABNORMAL HIGH (ref 0.3–0.9)
Total Bilirubin: 3.5 mg/dL — ABNORMAL HIGH (ref 0.3–1.2)
Total Protein: 8.7 g/dL — ABNORMAL HIGH (ref 6.5–8.1)

## 2020-09-15 LAB — BASIC METABOLIC PANEL
Anion gap: 14 (ref 5–15)
BUN: 6 mg/dL (ref 6–20)
CO2: 22 mmol/L (ref 22–32)
Calcium: 8.6 mg/dL — ABNORMAL LOW (ref 8.9–10.3)
Chloride: 94 mmol/L — ABNORMAL LOW (ref 98–111)
Creatinine, Ser: 0.81 mg/dL (ref 0.61–1.24)
GFR, Estimated: >60 mL/min (ref >60)
Glucose, Bld: 106 mg/dL — ABNORMAL HIGH (ref 70–99)
Potassium: 3.2 mmol/L — ABNORMAL LOW (ref 3.5–5.1)
Sodium: 130 mmol/L — ABNORMAL LOW (ref 135–145)

## 2020-09-15 LAB — CBC WITH DIFFERENTIAL/PLATELET
Abs Immature Granulocytes: 0.1 10*3/uL — ABNORMAL HIGH (ref 0.00–0.07)
Basophils Absolute: 0 10*3/uL (ref 0.0–0.1)
Basophils Relative: 0 %
Eosinophils Absolute: 0 10*3/uL (ref 0.0–0.5)
Eosinophils Relative: 0 %
HCT: 36.1 % — ABNORMAL LOW (ref 39.0–52.0)
Hemoglobin: 12 g/dL — ABNORMAL LOW (ref 13.0–17.0)
Immature Granulocytes: 1 %
Lymphocytes Relative: 6 %
Lymphs Abs: 0.7 10*3/uL (ref 0.7–4.0)
MCH: 29.9 pg (ref 26.0–34.0)
MCHC: 33.2 g/dL (ref 30.0–36.0)
MCV: 90 fL (ref 80.0–100.0)
Monocytes Absolute: 0.5 10*3/uL (ref 0.1–1.0)
Monocytes Relative: 4 %
Neutro Abs: 11.2 10*3/uL — ABNORMAL HIGH (ref 1.7–7.7)
Neutrophils Relative %: 89 %
Platelets: 47 10*3/uL — ABNORMAL LOW (ref 150–400)
RBC: 4.01 MIL/uL — ABNORMAL LOW (ref 4.22–5.81)
RDW: 16.6 % — ABNORMAL HIGH (ref 11.5–15.5)
WBC: 12.5 10*3/uL — ABNORMAL HIGH (ref 4.0–10.5)
nRBC: 0 % (ref 0.0–0.2)

## 2020-09-15 LAB — SODIUM, URINE, RANDOM: Sodium, Ur: 61 mmol/L

## 2020-09-15 LAB — PROTIME-INR
INR: 1 (ref 0.8–1.2)
Prothrombin Time: 12.9 seconds (ref 11.4–15.2)

## 2020-09-15 LAB — OSMOLALITY, URINE: Osmolality, Ur: 505 mOsm/kg (ref 300–900)

## 2020-09-15 LAB — FERRITIN: Ferritin: 84 ng/mL (ref 24–336)

## 2020-09-15 LAB — FOLATE: Folate: 4.3 ng/mL — ABNORMAL LOW (ref >5.9)

## 2020-09-15 LAB — IRON AND TIBC
Iron: 204 ug/dL — ABNORMAL HIGH (ref 45–182)
Saturation Ratios: 62 % — ABNORMAL HIGH (ref 17.9–39.5)
TIBC: 329 ug/dL (ref 250–450)
UIBC: 125 ug/dL

## 2020-09-15 LAB — ACETAMINOPHEN LEVEL: Acetaminophen (Tylenol), Serum: <10 ug/mL — ABNORMAL LOW (ref 10–30)

## 2020-09-15 LAB — VITAMIN B12: Vitamin B-12: 456 pg/mL (ref 180–914)

## 2020-09-15 LAB — HIV ANTIBODY (ROUTINE TESTING W REFLEX): HIV Screen 4th Generation wRfx: NONREACTIVE

## 2020-09-15 LAB — HEPATITIS PANEL, ACUTE
HCV Ab: NONREACTIVE
Hep A IgM: NONREACTIVE
Hep B C IgM: NONREACTIVE
Hepatitis B Surface Ag: NONREACTIVE

## 2020-09-15 NOTE — ED Notes (Signed)
Notified by other staff that pt was found in the floor.  Upon arrival to his room found pt sitting on end of bed with bleeding noted from bridge of nose. Pt stated he was trying to go to the bathroom, however pt had been informed that he had an external catheter in place. Bed alarm was on but staff states that is didn't go off until he was out of bed. Pt remains A/O x4, and denies any pain or discomfort.

## 2020-09-15 NOTE — ED Notes (Addendum)
Patient found on the floor, IV pulled out, clothes, and sheets on the floor, took off his hospital gown and had his hoodie on instead. Patient stated he was going to his mothers house. Staff assist was called to help patient back to bed. Patient had no injuries. Stated he slid to the floor. No complaints of pain. Patient redressed, alarm reset, and call bell in reach.

## 2020-09-15 NOTE — Progress Notes (Signed)
PROGRESS NOTE    Dylan Weaver  GPQ:[id number] DOB: 1966/06/23 DOA: 09/14/2020 PCP: Patient, No Pcp Per    Brief Narrative:  54 y.o. male with history of alcohol abuse patient states that over the last couple months he has been drinking more than usual after his divorce.  Denies any suicidal ideation denies taking any medications physically denies taking Tylenol states he has been feeling weak last couple of days last drink was more than 24 hours and has been having difficulty to walk because of the weakness.  Denies losing consciousness denies any chest pain shortness of breath has not been eating well.  Patient lives with his mother.  Ambulance was called and patient was on the floor in an unkempt situation.  ED Course: In the ER patient is mildly tachycardic labs show elevated LFTs sodium of 129 thrombocytopenia and mild normocytic normochromic anemia with mild leukocytosis UA concerning for UTI CT head was unremarkable.  X-ray chest and pelvis was unremarkable.  EKG shows sinus tachycardia.  Patient on exam is tremulous tachycardic and weak.  Patient was started on CIWA protocol admitted for alcohol withdrawal generalized weakness likely from dehydration and deconditioning  Assessment & Plan:   Principal Problem:   Alcohol withdrawal (HCC) Active Problems:   Alcoholic hepatitis   Thrombocytopenia (HCC)   Normochromic normocytic anemia  1. Alcohol withdrawal  1. patient has been placed on CIWA protocol  2. social work consult we will closely monitor.   3. CIWA score remains elevated at 7 despite benzos 4. Recheck cmp in AM 2. Generalized weakness with difficulty walking  1. Suspect could be from deconditioning.   2. CT cervical spine unremarkable, reviewed 3. Will consult PT 3. Possible UTI on ceftriaxone.   1. Urine culture pending  2. Afebrile 4. Elevated LFTs likely from alcohol hepatitis.   1. LFT's trending down 2. Recommend continued ETOH cessation 3. Repeat  cmp in AM  5. Normocytic normochromic anemia no old labs to compare.   1. Iron levels unremarkable 2. B12 levels within normal limits 3. Folate is low at 4.3. Continue folic acid replacement 6. Thrombocytopenia likely from alcoholism 1. Plts trending down 2. Repeat cbc in AM 7. Hyponatremia   1. Suspect secondary to ETOH abuse 2. Cont IVF hydration 3. Repeat cmp in AM   DVT prophylaxis: SCD's Code Status: Full Family Communication: Pt in room, family not at bedside  Status is: Observation  The patient will require care spanning > 2 midnights and should be moved to inpatient because: Ongoing diagnostic testing needed not appropriate for outpatient work up, IV treatments appropriate due to intensity of illness or inability to take PO and Inpatient level of care appropriate due to severity of illness  Dispo: The patient is from: Home              Anticipated d/c is to: Home              Anticipated d/c date is: > 3 days              Patient currently is not medically stable to d/c.       Consultants:     Procedures:     Antimicrobials: Anti-infectives (From admission, onward)   Start     Dose/Rate Route Frequency Ordered Stop   09/15/20 2000  cefTRIAXone (ROCEPHIN) 1 g in sodium chloride 0.9 % 100 mL IVPB        1 g 200 mL/hr over 30 Minutes Intravenous Every 24  hours 09/14/20 2205     09/14/20 2015  cefTRIAXone (ROCEPHIN) 1 g in sodium chloride 0.9 % 100 mL IVPB        1 g 200 mL/hr over 30 Minutes Intravenous  Once 09/14/20 2007 09/14/20 2210       Subjective: Needing help opening his salad, otherwise without complaints when seen.  Objective: Vitals:   09/15/20 1215 09/15/20 1230 09/15/20 1245 09/15/20 1530  BP:  112/80  126/84  Pulse: (!) 109 100 (!) 104 (!) 114  Resp: (!) 21 20 (!) 26 17  Temp:      TempSrc:      SpO2: 98% 98% 97% 98%    Intake/Output Summary (Last 24 hours) at 09/15/2020 1747 Last data filed at 09/14/2020 2210 Gross per 24 hour   Intake 1099.38 ml  Output --  Net 1099.38 ml   There were no vitals filed for this visit.  Examination:  General exam: Appears calm and comfortable  Respiratory system: Clear to auscultation. Respiratory effort normal. Cardiovascular system: S1 & S2 heard, Regular Gastrointestinal system: Abdomen is nondistended, soft and nontender. No organomegaly or masses felt. Normal bowel sounds heard. Central nervous system: Alert and oriented. No focal neurological deficits. Extremities: Symmetric 5 x 5 power. Skin: No rashes, lesions Psychiatry: Judgement and insight appear normal. Mood & affect appropriate.   Data Reviewed: I have personally reviewed following labs and imaging studies  CBC: Recent Labs  Lab 09/14/20 1607 09/15/20 0458  WBC 11.4* 12.5*  NEUTROABS 10.4* 11.2*  HGB 12.7* 12.0*  HCT 37.7* 36.1*  MCV 87.9 90.0  PLT 56* 47*   Basic Metabolic Panel: Recent Labs  Lab 09/14/20 1607 09/15/20 0458  NA 129* 130*  K 3.9 3.2*  CL 90* 94*  CO2 21* 22  GLUCOSE 126* 106*  BUN <5* 6  CREATININE 0.76 0.81  CALCIUM 9.0 8.6*   GFR: CrCl cannot be calculated (Unknown ideal weight.). Liver Function Tests: Recent Labs  Lab 09/14/20 1607 09/15/20 0458  AST 218* 173*  ALT 58* 51*  ALKPHOS 436* 342*  BILITOT 2.9* 3.5*  PROT 9.4* 8.7*  ALBUMIN 3.3* 3.0*   No results for input(s): LIPASE, AMYLASE in the last 168 hours. No results for input(s): AMMONIA in the last 168 hours. Coagulation Profile: Recent Labs  Lab 09/15/20 0458  INR 1.0   Cardiac Enzymes: Recent Labs  Lab 09/14/20 1607  CKTOTAL 130   BNP (last 3 results) No results for input(s): PROBNP in the last 8760 hours. HbA1C: No results for input(s): HGBA1C in the last 72 hours. CBG: No results for input(s): GLUCAP in the last 168 hours. Lipid Profile: No results for input(s): CHOL, HDL, LDLCALC, TRIG, CHOLHDL, LDLDIRECT in the last 72 hours. Thyroid Function Tests: Recent Labs    09/14/20 2221   TSH 1.556   Anemia Panel: Recent Labs    09/14/20 2221  VITAMINB12 456  FOLATE 4.3*  FERRITIN 84  TIBC 329  IRON 204*  RETICCTPCT 1.6   Sepsis Labs: No results for input(s): PROCALCITON, LATICACIDVEN in the last 168 hours.  Recent Results (from the past 240 hour(s))  Resp Panel by RT-PCR (Flu A&B, Covid) Nasopharyngeal Swab     Status: None   Collection Time: 09/14/20  4:07 PM   Specimen: Nasopharyngeal Swab; Nasopharyngeal(NP) swabs in vial transport medium  Result Value Ref Range Status   SARS Coronavirus 2 by RT PCR NEGATIVE NEGATIVE Final    Comment: (NOTE) SARS-CoV-2 target nucleic acids are NOT DETECTED.  The  SARS-CoV-2 RNA is generally detectable in upper respiratory specimens during the acute phase of infection. The lowest concentration of SARS-CoV-2 viral copies this assay can detect is 138 copies/mL. A negative result does not preclude SARS-Cov-2 infection and should not be used as the sole basis for treatment or other patient management decisions. A negative result may occur with  improper specimen collection/handling, submission of specimen other than nasopharyngeal swab, presence of viral mutation(s) within the areas targeted by this assay, and inadequate number of viral copies(<138 copies/mL). A negative result must be combined with clinical observations, patient history, and epidemiological information. The expected result is Negative.  Fact Sheet for Patients:  BloggerCourse.com  Fact Sheet for Healthcare Providers:  SeriousBroker.it  This test is no t yet approved or cleared by the [place] FDA and  has been authorized for detection and/or diagnosis of SARS-CoV-2 by FDA under an Emergency Use Authorization (EUA). This EUA will remain  in effect (meaning this test can be used) for the duration of the COVID-19 declaration under Section 564(b)(1) of the Act, 21 U.S.C.section 360bbb-3(b)(1), unless  the authorization is terminated  or revoked sooner.       Influenza A by PCR NEGATIVE NEGATIVE Final   Influenza B by PCR NEGATIVE NEGATIVE Final    Comment: (NOTE) The Xpert Xpress SARS-CoV-2/FLU/RSV plus assay is intended as an aid in the diagnosis of influenza from Nasopharyngeal swab specimens and should not be used as a sole basis for treatment. Nasal washings and aspirates are unacceptable for Xpert Xpress SARS-CoV-2/FLU/RSV testing.  Fact Sheet for Patients: BloggerCourse.com  Fact Sheet for Healthcare Providers: SeriousBroker.it  This test is not yet approved or cleared by the [place] FDA and has been authorized for detection and/or diagnosis of SARS-CoV-2 by FDA under an Emergency Use Authorization (EUA). This EUA will remain in effect (meaning this test can be used) for the duration of the COVID-19 declaration under Section 564(b)(1) of the Act, 21 U.S.C. section 360bbb-3(b)(1), unless the authorization is terminated or revoked.  Performed at [facility], [address] [place], [place] [address]      Radiology Studies: DG Chest 2 View  Result Date: 09/14/2020 CLINICAL DATA:  54 year old male with weakness. EXAM: CHEST - 2 VIEW COMPARISON:  None FINDINGS: The heart size and mediastinal contours are within normal limits. Both lungs are clear. The visualized skeletal structures are unremarkable. IMPRESSION: No active cardiopulmonary disease. Electronically Signed   By: Elgie Collard M.D.   On: 09/14/2020 16:42   DG Pelvis 1-2 Views  Result Date: 09/14/2020 CLINICAL DATA:  Weakness. Fall. EXAM: PELVIS - 1-2 VIEW COMPARISON:  None. FINDINGS: The cortical margins of the bony pelvis are intact. No fracture. Pubic symphysis and sacroiliac joints are congruent. Both femoral heads are well-seated in the respective acetabula. IMPRESSION: No pelvic fracture. Electronically Signed   By: Narda Rutherford M.D.   On: 09/14/2020 16:42   CT Head Wo Contrast  Result Date: 09/14/2020 CLINICAL DATA:  Fall.  Alcohol abuse. EXAM: CT HEAD WITHOUT CONTRAST TECHNIQUE: Contiguous axial images were obtained from the base of the skull through the vertex without intravenous contrast. COMPARISON:  None. FINDINGS: Brain: No evidence of acute infarction, hemorrhage, hydrocephalus, extra-axial collection or mass lesion/mass effect. Mild generalized cerebral atrophy, advanced for age. Vascular: Atherosclerotic vascular calcification of the carotid siphons. No hyperdense vessel. Skull: Normal. Negative for fracture or focal lesion. Sinuses/Orbits: No acute finding. Other: None. IMPRESSION: 1. No acute intracranial abnormality. 2. Age advanced cerebral atrophy.  Electronically Signed   By: Obie Dredge M.D.   On: 09/14/2020 17:51   CT CERVICAL SPINE WO CONTRAST  Result Date: 09/15/2020 CLINICAL DATA:  54 year old male status post fall. Found down. Tremors. Possible ETOH abuse. EXAM: CT CERVICAL SPINE WITHOUT CONTRAST TECHNIQUE: Multidetector CT imaging of the cervical spine was performed without intravenous contrast. Multiplanar CT image reconstructions were also generated. COMPARISON:  Head CT 09/14/2020. FINDINGS: Alignment: Mild reversal of cervical lordosis. Cervicothoracic junction alignment is within normal limits. Bilateral posterior element alignment is within normal limits. Skull base and vertebrae: Visualized skull base is intact. No atlanto-occipital dissociation. C1-C2 appear aligned and intact. No acute osseous abnormality identified. Advanced left TMJ degeneration, possibly posttraumatic. Soft tissues and spinal canal: No prevertebral fluid or swelling. No visible canal hematoma. Negative noncontrast visible neck soft tissues. Disc levels: Multilevel cervical disc degeneration including vacuum disc at C3-C4. Associated intermittent endplate spurring. No cervical spinal stenosis suspected. Upper chest:  Chronic left clavicle deformity partially visible. Visible upper thoracic levels appear intact. Apical lung emphysema. Negative visible noncontrast thoracic inlet and superior mediastinum. Other: Negative visible posterior fossa. IMPRESSION: 1. No acute traumatic injury identified in the cervical spine. 2. Multilevel cervical disc and endplate degeneration. 3. Upper lung emphysema suspected. Electronically Signed   By: Odessa Fleming M.D.   On: 09/15/2020 04:39    Scheduled Meds: . folic acid  1 mg Oral Daily  . influenza vac split quadrivalent PF  0.5 mL Intramuscular Tomorrow-1000  . LORazepam  0-4 mg Intravenous Q6H   Followed by  . [START ON 09/16/2020] LORazepam  0-4 mg Intravenous Q12H  . multivitamin with minerals  1 tablet Oral Daily  . thiamine  100 mg Oral Daily   Or  . thiamine  100 mg Intravenous Daily   Continuous Infusions: . sodium chloride 75 mL/hr at 09/14/20 2226  . cefTRIAXone (ROCEPHIN)  IV       LOS: 0 days   Rickey Barbara, MD [facility] Pager On Amion  If 7PM-7AM, please contact night-coverage 09/15/2020, 5:47 PM

## 2020-09-15 NOTE — ED Notes (Signed)
Pt given [place] sandwich and soda upon request

## 2020-09-15 NOTE — ED Notes (Signed)
ED TO INPATIENT HANDOFF REPORT  Name/Age/Gender Dylan Weaver 54 y.o. male  Code Status    Code Status Orders  (From admission, onward)         Start     Ordered   09/14/20 2203  Full code  Continuous        09/14/20 2205        Code Status History    This patient has a current code status but no historical code status.   Advance Care Planning Activity      Home/SNF/Other Home  Chief Complaint Alcohol withdrawal (HCC) [F10.239]  Level of Care/Admitting Diagnosis ED Disposition    ED Disposition Condition Comment   Admit  Hospital Area: [facility] [[id number]]  Level of Care: Progressive [102]  Admit to Progressive based on following criteria: MULTISYSTEM THREATS such as stable sepsis, metabolic/electrolyte imbalance with or without encephalopathy that is responding to early treatment.  May admit patient to [facility] if equivalent level of care is available:: Yes  Covid Evaluation: Confirmed COVID Negative  Diagnosis: Alcohol withdrawal (HCC) [291.81.ICD-9-CM]  Admitting Physician: Jerald Kief [[id number]]  Attending Physician: Jerald Kief [[id number]]  Estimated length of stay: 3 - 4 days  Certification:: I certify this patient will need inpatient services for at least 2 midnights       Medical History History reviewed. No pertinent past medical history.  Allergies No Known Allergies  IV Location/Drains/Wounds Patient Lines/Drains/Airways Status    Active Line/Drains/Airways    Name Placement date Placement time Site Days   Peripheral IV 09/15/20 Left Hand 09/15/20  0947  Hand  less than 1          Labs/Imaging Results for orders placed or performed during the hospital encounter of 09/14/20 (from the past 48 hour(s))  HIV Antibody (routine testing w rflx)     Status: None   Collection Time: 09/14/20  4:06 PM  Result Value Ref Range   HIV Screen 4th Generation wRfx Non Reactive Non Reactive    Comment:  Performed at [facility], [address] [place], [place] [address]  Hepatitis panel, acute     Status: None   Collection Time: 09/14/20  4:06 PM  Result Value Ref Range   Hepatitis B Surface Ag NON REACTIVE NON REACTIVE   HCV Ab NON REACTIVE NON REACTIVE    Comment: (NOTE) Nonreactive HCV antibody screen is consistent with no HCV infections,  unless recent infection is suspected or other evidence exists to indicate HCV infection.     Hep A IgM NON REACTIVE NON REACTIVE   Hep B C IgM NON REACTIVE NON REACTIVE    Comment: Performed at [facility], [address] [place], [place] [address]  CBC with Differential/Platelet     Status: Abnormal   Collection Time: 09/14/20  4:07 PM  Result Value Ref Range   WBC 11.4 (H) 4.0 - 10.5 K/uL   RBC 4.29 4.22 - 5.81 MIL/uL   Hemoglobin 12.7 (L) 13.0 - 17.0 g/dL   HCT 09.8 (L) 11.9 - 14.7 %   MCV 87.9 80.0 - 100.0 fL   MCH 29.6 26.0 - 34.0 pg   MCHC 33.7 30.0 - 36.0 g/dL   RDW 82.9 (H) 56.2 - 13.0 %   Platelets 56 (L) 150 - 400 K/uL    Comment: REPEATED TO VERIFY PLATELET COUNT CONFIRMED BY SMEAR SPECIMEN CHECKED FOR CLOTS Immature Platelet Fraction may be clinically indicated, consider ordering  this additional test [id number]    nRBC 0.0 0.0 - 0.2 %   Neutrophils Relative % 91 %   Neutro Abs 10.4 (H) 1.7 - 7.7 K/uL   Lymphocytes Relative 3 %   Lymphs Abs 0.4 (L) 0.7 - 4.0 K/uL   Monocytes Relative 5 %   Monocytes Absolute 0.5 0.1 - 1.0 K/uL   Eosinophils Relative 0 %   Eosinophils Absolute 0.0 0.0 - 0.5 K/uL   Basophils Relative 0 %   Basophils Absolute 0.0 0.0 - 0.1 K/uL   Immature Granulocytes 1 %   Abs Immature Granulocytes 0.07 0.00 - 0.07 K/uL    Comment: Performed at [facility], [address] [place], [place] [address]  Comprehensive metabolic panel     Status: Abnormal   Collection Time: 09/14/20  4:07 PM  Result Value Ref Range   Sodium 129 (L) 135 - 145 mmol/L   Potassium 3.9 3.5 -  5.1 mmol/L   Chloride 90 (L) 98 - 111 mmol/L   CO2 21 (L) 22 - 32 mmol/L   Glucose, Bld 126 (H) 70 - 99 mg/dL    Comment: Glucose reference range applies only to samples taken after fasting for at least 8 hours.   BUN <5 (L) 6 - 20 mg/dL   Creatinine, Ser 5.36 0.61 - 1.24 mg/dL   Calcium 9.0 8.9 - 14.4 mg/dL   Total Protein 9.4 (H) 6.5 - 8.1 g/dL   Albumin 3.3 (L) 3.5 - 5.0 g/dL   AST 315 (H) 15 - 41 U/L   ALT 58 (H) 0 - 44 U/L   Alkaline Phosphatase 436 (H) 38 - 126 U/L   Total Bilirubin 2.9 (H) 0.3 - 1.2 mg/dL   GFR, Estimated >40 >08 mL/min    Comment: (NOTE) Calculated using the CKD-EPI Creatinine Equation (2021)    Anion gap 18 (H) 5 - 15    Comment: Performed at [facility], [address], [place], [place] [address]  Ethanol     Status: None   Collection Time: 09/14/20  4:07 PM  Result Value Ref Range   Alcohol, Ethyl (B) <10 <10 mg/dL    Comment: (NOTE) Lowest detectable limit for serum alcohol is 10 mg/dL.  For medical purposes only. Performed at [facility], [address], [place], [place] [address]   Urinalysis, Routine w reflex microscopic Urine, Clean Catch     Status: Abnormal   Collection Time: 09/14/20  4:07 PM  Result Value Ref Range   Color, Urine AMBER (A) YELLOW    Comment: BIOCHEMICALS MAY BE AFFECTED BY COLOR   APPearance HAZY (A) CLEAR   Specific Gravity, Urine 1.013 1.005 - 1.030   pH 8.0 5.0 - 8.0   Glucose, UA NEGATIVE NEGATIVE mg/dL   Hgb urine dipstick MODERATE (A) NEGATIVE   Bilirubin Urine NEGATIVE NEGATIVE   Ketones, ur 5 (A) NEGATIVE mg/dL   Protein, ur 671 (A) NEGATIVE mg/dL   Nitrite NEGATIVE NEGATIVE   Leukocytes,Ua MODERATE (A) NEGATIVE   RBC / HPF >50 (H) 0 - 5 RBC/hpf   WBC, UA >50 (H) 0 - 5 WBC/hpf   Bacteria, UA RARE (A) NONE SEEN   WBC Clumps PRESENT    Mucus PRESENT     Comment: Performed at [facility], [address] [place], [place] [address]  Rapid urine drug  screen (hospital performed)     Status: None   Collection Time: 09/14/20  4:07 PM  Result Value  Ref Range   Opiates NONE DETECTED NONE DETECTED   Cocaine NONE DETECTED NONE DETECTED   Benzodiazepines NONE DETECTED NONE DETECTED   Amphetamines NONE DETECTED NONE DETECTED   Tetrahydrocannabinol NONE DETECTED NONE DETECTED   Barbiturates NONE DETECTED NONE DETECTED    Comment: (NOTE) DRUG SCREEN FOR MEDICAL PURPOSES ONLY.  IF CONFIRMATION IS NEEDED FOR ANY PURPOSE, NOTIFY LAB WITHIN 5 DAYS.  LOWEST DETECTABLE LIMITS FOR URINE DRUG SCREEN Drug Class                     Cutoff (ng/mL) Amphetamine and metabolites    1000 Barbiturate and metabolites    200 Benzodiazepine                 200 Tricyclics and metabolites     300 Opiates and metabolites        300 Cocaine and metabolites        300 THC                            50 Performed at [facility], [address] [place], [place] [address]   CK     Status: None   Collection Time: 09/14/20  4:07 PM  Result Value Ref Range   Total CK 130 49 - 397 U/L    Comment: Performed at [facility], [address] [place], [place] [address]  Resp Panel by RT-PCR (Flu A&B, Covid) Nasopharyngeal Swab     Status: None   Collection Time: 09/14/20  4:07 PM   Specimen: Nasopharyngeal Swab; Nasopharyngeal(NP) swabs in vial transport medium  Result Value Ref Range   SARS Coronavirus 2 by RT PCR NEGATIVE NEGATIVE    Comment: (NOTE) SARS-CoV-2 target nucleic acids are NOT DETECTED.  The SARS-CoV-2 RNA is generally detectable in upper respiratory specimens during the acute phase of infection. The lowest concentration of SARS-CoV-2 viral copies this assay can detect is 138 copies/mL. A negative result does not preclude SARS-Cov-2 infection and should not be used as the sole basis for treatment or other patient management decisions. A negative result may occur with  improper specimen collection/handling,  submission of specimen other than nasopharyngeal swab, presence of viral mutation(s) within the areas targeted by this assay, and inadequate number of viral copies(<138 copies/mL). A negative result must be combined with clinical observations, patient history, and epidemiological information. The expected result is Negative.  Fact Sheet for Patients:  BloggerCourse.com  Fact Sheet for Healthcare Providers:  SeriousBroker.it  This test is no t yet approved or cleared by the [place] FDA and  has been authorized for detection and/or diagnosis of SARS-CoV-2 by FDA under an Emergency Use Authorization (EUA). This EUA will remain  in effect (meaning this test can be used) for the duration of the COVID-19 declaration under Section 564(b)(1) of the Act, 21 U.S.C.section 360bbb-3(b)(1), unless the authorization is terminated  or revoked sooner.       Influenza A by PCR NEGATIVE NEGATIVE   Influenza B by PCR NEGATIVE NEGATIVE    Comment: (NOTE) The Xpert Xpress SARS-CoV-2/FLU/RSV plus assay is intended as an aid in the diagnosis of influenza from Nasopharyngeal swab specimens and should not be used as a sole basis for treatment. Nasal washings and aspirates are unacceptable for Xpert Xpress SARS-CoV-2/FLU/RSV testing.  Fact Sheet for Patients: BloggerCourse.com  Fact Sheet for Healthcare Providers: SeriousBroker.it  This test is not yet approved or cleared  by the [place] and has been authorized for detection and/or diagnosis of SARS-CoV-2 by FDA under an Emergency Use Authorization (EUA). This EUA will remain in effect (meaning this test can be used) for the duration of the COVID-19 declaration under Section 564(b)(1) of the Act, 21 U.S.C. section 360bbb-3(b)(1), unless the authorization is terminated or revoked.  Performed at [facility], [address] [place], [place] [address]   Urine Culture     Status: Abnormal (Preliminary result)   Collection Time: 09/14/20  6:22 PM   Specimen: Urine, Random  Result Value Ref Range   Specimen Description      URINE, RANDOM Performed at [facility], [address], [place], [place] [address]    Special Requests      NONE Performed at [facility], [address] [place], [place] [address]    Culture (A)     >=100,000 COLONIES/mL GRAM NEGATIVE RODS IDENTIFICATION AND SUSCEPTIBILITIES TO FOLLOW Performed at [facility], [address] [place], [place] [address]    Report Status PENDING   TSH     Status: None   Collection Time: 09/14/20 10:21 PM  Result Value Ref Range   TSH 1.556 0.350 - 4.500 uIU/mL    Comment: Performed by a 3rd Generation assay with a functional sensitivity of <=0.01 uIU/mL. Performed at [facility], [address], [place], [place] [address]   Vitamin B12     Status: None   Collection Time: 09/14/20 10:21 PM  Result Value Ref Range   Vitamin B-12 456 180 - 914 pg/mL    Comment: (NOTE) This assay is not validated for testing neonatal or myeloproliferative syndrome specimens for Vitamin B12 levels. Performed at [facility], [address] [place], [place] [address]   Folate     Status: Abnormal   Collection Time: 09/14/20 10:21 PM  Result Value Ref Range   Folate 4.3 (L) >5.9 ng/mL    Comment: Performed at [facility], [address] [place], [place] [address]  Iron and TIBC     Status: Abnormal   Collection Time: 09/14/20 10:21 PM  Result Value Ref Range   Iron 204 (H) 45 - 182 ug/dL   TIBC 324 401 - 027 ug/dL   Saturation Ratios 62 (H) 17.9 - 39.5 %   UIBC 125 ug/dL    Comment: Performed at [facility], [address], [place], [place] [address]  Ferritin     Status: None   Collection Time: 09/14/20 10:21 PM  Result  Value Ref Range   Ferritin 84 24 - 336 ng/mL    Comment: Performed at [facility], [address], [place], [place] [address]  Reticulocytes     Status: Abnormal   Collection Time: 09/14/20 10:21 PM  Result Value Ref Range   Retic Ct Pct 1.6 0.4 - 3.1 %   RBC. 4.23 4.22 - 5.81 MIL/uL   Retic Count, Absolute 66.8 19.0 - 186.0 K/uL   Immature Retic Fract 21.2 (H) 2.3 - 15.9 %    Comment: Performed at [facility], [address], [place], [place] [address]  Hepatic function panel     Status: Abnormal   Collection Time: 09/15/20  4:58 AM  Result Value Ref Range   Total Protein 8.7 (H) 6.5 - 8.1 g/dL   Albumin 3.0 (L) 3.5 - 5.0 g/dL   AST 563 (H) 15 -  41 U/L   ALT 51 (H) 0 - 44 U/L   Alkaline Phosphatase 342 (H) 38 - 126 U/L   Total Bilirubin 3.5 (H) 0.3 - 1.2 mg/dL   Bilirubin, Direct 2.1 (H) 0.0 - 0.2 mg/dL   Indirect Bilirubin 1.4 (H) 0.3 - 0.9 mg/dL    Comment: Performed at [facility], [address] [place], [place] [address]  CBC WITH DIFFERENTIAL     Status: Abnormal   Collection Time: 09/15/20  4:58 AM  Result Value Ref Range   WBC 12.5 (H) 4.0 - 10.5 K/uL   RBC 4.01 (L) 4.22 - 5.81 MIL/uL   Hemoglobin 12.0 (L) 13.0 - 17.0 g/dL   HCT 60.4 (L) 54.0 - 98.1 %   MCV 90.0 80.0 - 100.0 fL   MCH 29.9 26.0 - 34.0 pg   MCHC 33.2 30.0 - 36.0 g/dL   RDW 19.1 (H) 47.8 - 29.5 %   Platelets 47 (L) 150 - 400 K/uL    Comment: Immature Platelet Fraction may be clinically indicated, consider ordering this additional test [id number] CONSISTENT WITH PREVIOUS RESULT    nRBC 0.0 0.0 - 0.2 %   Neutrophils Relative % 89 %   Neutro Abs 11.2 (H) 1.7 - 7.7 K/uL   Lymphocytes Relative 6 %   Lymphs Abs 0.7 0.7 - 4.0 K/uL   Monocytes Relative 4 %   Monocytes Absolute 0.5 0.1 - 1.0 K/uL   Eosinophils Relative 0 %   Eosinophils Absolute 0.0 0.0 - 0.5 K/uL   Basophils Relative 0 %   Basophils Absolute 0.0 0.0 - 0.1 K/uL   Immature  Granulocytes 1 %   Abs Immature Granulocytes 0.10 (H) 0.00 - 0.07 K/uL    Comment: Performed at [facility], [address] [place], [place] [address]  Basic metabolic panel     Status: Abnormal   Collection Time: 09/15/20  4:58 AM  Result Value Ref Range   Sodium 130 (L) 135 - 145 mmol/L   Potassium 3.2 (L) 3.5 - 5.1 mmol/L    Comment: DELTA CHECK NOTED   Chloride 94 (L) 98 - 111 mmol/L   CO2 22 22 - 32 mmol/L   Glucose, Bld 106 (H) 70 - 99 mg/dL    Comment: Glucose reference range applies only to samples taken after fasting for at least 8 hours.   BUN 6 6 - 20 mg/dL   Creatinine, Ser 6.29 0.61 - 1.24 mg/dL   Calcium 8.6 (L) 8.9 - 10.3 mg/dL   GFR, Estimated >52 >84 mL/min    Comment: (NOTE) Calculated using the CKD-EPI Creatinine Equation (2021)    Anion gap 14 5 - 15    Comment: Performed at [facility], [address], [place], [place] [address]  Acetaminophen level     Status: Abnormal   Collection Time: 09/15/20  4:58 AM  Result Value Ref Range   Acetaminophen (Tylenol), Serum <10 (L) 10 - 30 ug/mL    Comment: (NOTE) Therapeutic concentrations vary significantly. A range of 10-30 ug/mL  may be an effective concentration for many patients. However, some  are best treated at concentrations outside of this range. Acetaminophen concentrations >150 ug/mL at 4 hours after ingestion  and >50 ug/mL at 12 hours after ingestion are often associated with  toxic reactions.  Performed at [facility], [address], [place], [place] [address]   Protime-INR     Status: None   Collection Time: 09/15/20  4:58 AM  Result Value Ref Range   Prothrombin Time 12.9 11.4 - 15.2 seconds   INR 1.0 0.8 - 1.2    Comment: (NOTE) INR goal varies based on device and disease states. Performed at [facility], [address] [place], [place] [address]   Sodium, urine, random     Status: None   Collection Time:  09/15/20  4:58 AM  Result Value Ref Range   Sodium, Ur 61 mmol/L    Comment: Performed at [facility], [address] [place], [place] [address]  Osmolality, urine     Status: None   Collection Time: 09/15/20  4:58 AM  Result Value Ref Range   Osmolality, Ur 505 300 - 900 mOsm/kg    Comment: Performed at [facility], [address] [place], [place] [address]   DG Chest 2 View  Result Date: 09/14/2020 CLINICAL DATA:  54 year old male with weakness. EXAM: CHEST - 2 VIEW COMPARISON:  None FINDINGS: The heart size and mediastinal contours are within normal limits. Both lungs are clear. The visualized skeletal structures are unremarkable. IMPRESSION: No active cardiopulmonary disease. Electronically Signed   By: Elgie CollardArash  Radparvar M.D.   On: 09/14/2020 16:42   DG Pelvis 1-2 Views  Result Date: 09/14/2020 CLINICAL DATA:  Weakness. Fall. EXAM: PELVIS - 1-2 VIEW COMPARISON:  None. FINDINGS: The cortical margins of the bony pelvis are intact. No fracture. Pubic symphysis and sacroiliac joints are congruent. Both femoral heads are well-seated in the respective acetabula. IMPRESSION: No pelvic fracture. Electronically Signed   By: Narda RutherfordMelanie  Sanford M.D.   On: 09/14/2020 16:42   CT Head Wo Contrast  Result Date: 09/14/2020 CLINICAL DATA:  Fall.  Alcohol abuse. EXAM: CT HEAD WITHOUT CONTRAST TECHNIQUE: Contiguous axial images were obtained from the base of the skull through the vertex without intravenous contrast. COMPARISON:  None. FINDINGS: Brain: No evidence of acute infarction, hemorrhage, hydrocephalus, extra-axial collection or mass lesion/mass effect. Mild generalized cerebral atrophy, advanced for age. Vascular: Atherosclerotic vascular calcification of the carotid siphons. No hyperdense vessel. Skull: Normal. Negative for fracture or focal lesion. Sinuses/Orbits: No acute finding. Other: None. IMPRESSION: 1. No acute intracranial abnormality. 2. Age advanced cerebral  atrophy. Electronically Signed   By: Obie DredgeWilliam T Derry M.D.   On: 09/14/2020 17:51   CT CERVICAL SPINE WO CONTRAST  Result Date: 09/15/2020 CLINICAL DATA:  54 year old male status post fall. Found down. Tremors. Possible ETOH abuse. EXAM: CT CERVICAL SPINE WITHOUT CONTRAST TECHNIQUE: Multidetector CT imaging of the cervical spine was performed without intravenous contrast. Multiplanar CT image reconstructions were also generated. COMPARISON:  Head CT 09/14/2020. FINDINGS: Alignment: Mild reversal of cervical lordosis. Cervicothoracic junction alignment is within normal limits. Bilateral posterior element alignment is within normal limits. Skull base and vertebrae: Visualized skull base is intact. No atlanto-occipital dissociation. C1-C2 appear aligned and intact. No acute osseous abnormality identified. Advanced left TMJ degeneration, possibly posttraumatic. Soft tissues and spinal canal: No prevertebral fluid or swelling. No visible canal hematoma. Negative noncontrast visible neck soft tissues. Disc levels: Multilevel cervical disc degeneration including vacuum disc at C3-C4. Associated intermittent endplate spurring. No cervical spinal stenosis suspected. Upper chest: Chronic left clavicle deformity partially visible. Visible upper thoracic levels appear intact. Apical lung emphysema. Negative visible noncontrast thoracic inlet and superior mediastinum. Other: Negative visible posterior fossa. IMPRESSION: 1. No acute traumatic injury identified in the cervical spine. 2. Multilevel cervical disc and endplate degeneration. 3. Upper lung emphysema suspected. Electronically Signed   By: Odessa FlemingH  Hall  M.D.   On: 09/15/2020 04:39    Pending Labs Unresulted Labs (From admission, onward)         None      Vitals/Pain Today's Vitals   09/15/20 1600 09/15/20 1630 09/15/20 1908 09/15/20 1930  BP: 120/80 122/85 116/84 123/80  Pulse: (!) 106 (!) 112 (!) 121 (!) 108  Resp: (!) 26  19 (!) 28  Temp:      TempSrc:       SpO2: 97% 97% 98% 96%  PainSc:        Isolation Precautions No active isolations  Medications Medications  influenza vac split quadrivalent PF (FLUARIX) injection 0.5 mL (0.5 mLs Intramuscular Refused 09/15/20 0952)  LORazepam (ATIVAN) tablet 1-4 mg (1 mg Oral Given 09/15/20 1956)    Or  LORazepam (ATIVAN) injection 1-4 mg ( Intravenous See Alternative 09/15/20 1956)  thiamine tablet 100 mg (100 mg Oral Given 09/15/20 0949)    Or  thiamine (B-1) injection 100 mg ( Intravenous See Alternative 09/15/20 0949)  folic acid (FOLVITE) tablet 1 mg (1 mg Oral Given 09/15/20 0949)  multivitamin with minerals tablet 1 tablet (1 tablet Oral Given 09/15/20 0949)  LORazepam (ATIVAN) injection 0-4 mg (2 mg Intravenous Given 09/15/20 1550)    Followed by  LORazepam (ATIVAN) injection 0-4 mg (has no administration in time range)  cefTRIAXone (ROCEPHIN) 1 g in sodium chloride 0.9 % 100 mL IVPB (has no administration in time range)  0.9 %  sodium chloride infusion ( Intravenous New Bag/Given 09/14/20 2226)  hydrocortisone cream 0.5 % ( Topical Given 09/14/20 1759)  sodium chloride 0.9 % bolus 1,000 mL (0 mLs Intravenous Stopped 09/14/20 1751)  LORazepam (ATIVAN) injection 1 mg (1 mg Intravenous Given 09/14/20 1823)  cefTRIAXone (ROCEPHIN) 1 g in sodium chloride 0.9 % 100 mL IVPB (0 g Intravenous Stopped 09/14/20 2210)    Mobility walks

## 2020-09-15 NOTE — ED Notes (Signed)
Pt resting comfortably with eyes closed. No distress noted.

## 2020-09-15 NOTE — ED Notes (Signed)
Bed alarm going off and pt trying to get out of bed to get dressed. When asked where he was going pt states "I shit the bed" Pt is noted to be oriented to person place and time. Skin care provided, pts scrotum and buttocks are extremely excoriated and sore. Lotion applied to area. Reinstructed pt to use call light and bed alarm remains on.

## 2020-09-15 NOTE — ED Notes (Signed)
Pt has had two episodes of diarrhea requiring linens to be changed even though pt is wearing a brief. Pt given bed bath, hydrocortisone cream reapplied to bottom and sacral area. Soiled linen and gown changed. Pt comfortable and sleep at this time

## 2020-09-16 DIAGNOSIS — E440 Moderate protein-calorie malnutrition: Secondary | ICD-10-CM | POA: Insufficient documentation

## 2020-09-16 LAB — COMPREHENSIVE METABOLIC PANEL
ALT: 43 U/L (ref 0–44)
AST: 127 U/L — ABNORMAL HIGH (ref 15–41)
Albumin: 2.9 g/dL — ABNORMAL LOW (ref 3.5–5.0)
Alkaline Phosphatase: 288 U/L — ABNORMAL HIGH (ref 38–126)
Anion gap: 15 (ref 5–15)
BUN: 5 mg/dL — ABNORMAL LOW (ref 6–20)
CO2: 20 mmol/L — ABNORMAL LOW (ref 22–32)
Calcium: 8.9 mg/dL (ref 8.9–10.3)
Chloride: 94 mmol/L — ABNORMAL LOW (ref 98–111)
Creatinine, Ser: 0.68 mg/dL (ref 0.61–1.24)
GFR, Estimated: >60 mL/min (ref >60)
Glucose, Bld: 80 mg/dL (ref 70–99)
Potassium: 3 mmol/L — ABNORMAL LOW (ref 3.5–5.1)
Sodium: 129 mmol/L — ABNORMAL LOW (ref 135–145)
Total Bilirubin: 4.5 mg/dL — ABNORMAL HIGH (ref 0.3–1.2)
Total Protein: 8.3 g/dL — ABNORMAL HIGH (ref 6.5–8.1)

## 2020-09-16 LAB — CBC
HCT: 35 % — ABNORMAL LOW (ref 39.0–52.0)
Hemoglobin: 11.6 g/dL — ABNORMAL LOW (ref 13.0–17.0)
MCH: 30.1 pg (ref 26.0–34.0)
MCHC: 33.1 g/dL (ref 30.0–36.0)
MCV: 90.9 fL (ref 80.0–100.0)
Platelets: 53 10*3/uL — ABNORMAL LOW (ref 150–400)
RBC: 3.85 MIL/uL — ABNORMAL LOW (ref 4.22–5.81)
RDW: 16.5 % — ABNORMAL HIGH (ref 11.5–15.5)
WBC: 10.4 10*3/uL (ref 4.0–10.5)
nRBC: 0 % (ref 0.0–0.2)

## 2020-09-16 LAB — URINE CULTURE: Culture: >=100000 — AB

## 2020-09-16 LAB — MAGNESIUM: Magnesium: 1.4 mg/dL — ABNORMAL LOW (ref 1.7–2.4)

## 2020-09-16 MED ORDER — ACETAMINOPHEN 325 MG PO TABS
325.0000 mg | ORAL_TABLET | Freq: Four times a day (QID) | ORAL | Status: DC | PRN
  Administered 2020-09-18 – 2020-09-23 (×3): 325 mg via ORAL
  Filled 2020-09-16 (×4): qty 1

## 2020-09-16 MED ORDER — SODIUM CHLORIDE 0.9 % IV SOLN: INTRAVENOUS | Status: DC

## 2020-09-16 NOTE — Progress Notes (Addendum)
Initial Nutrition Assessment  DOCUMENTATION CODES:   Non-severe (moderate) malnutrition in context of social or environmental circumstances  INTERVENTION:   Patient is at risk for refeeding syndrome. Labs to monitor: Phosphorus, potassium, and magnesium.   Ensure Enlive po BID, each supplement provides 350 kcal and 20 grams of protein  PROSource Plus 30 mL BID, each supplement provides 100 kcal and 15 grams of protein.   MVI with minerals   NUTRITION DIAGNOSIS:   Moderate Malnutrition related to social / environmental circumstances (alcohol abuse) as evidenced by moderate muscle depletion,mild muscle depletion,mild fat depletion.  GOAL:   Patient will meet greater than or equal to 90% of their needs  MONITOR:   PO intake,Supplement acceptance,Labs,Weight trends  REASON FOR ASSESSMENT:   Malnutrition Screening Tool    ASSESSMENT:   Pt is a 54 y.o. male with history of alcohol abuse, with an increase in alcohol consumption over the past few months after his divorce. EMS arrived to find pt on floor in unkempt situation. Pt admitted for alcohol withdrawal and generalized weakness likely from dehydration and deconditioning.  Talked to RN before entering pt's room. Spoke with pt at bedside. Pt resting comfortably in bed during RD visit. Pt presented confused during interview.  Pt stated he was hungry, RD assisted pt in setting up breakfast tray. Eating breakfast when left. Pt reports consuming breakfast at home but did not specific any food items. Pt was unable to provide further diet history. He stated he lives with his mom and she does not cook. Pt stated he drinks beer at home and denies drinking other forms of alcohol. Asked about amount of alcohol consumption and pt could not provide details.   Per chart review, pt's only previous weight history was from September. On 9/18 weight documented as 73.5 kg, on 9/20 weight documented as 81.6 kg, indicating a 8.1 kg difference in  two days. Upon this admission, pt weight is 80 kg. Given this information, unable to quantify weight trends at this time.   Labs reviewed: Sodium 129, Potassium 3.0, Chloride 94, Magnesium 1.4  Medications reviewed and include: Folvite, MVI with minerals, Thiamine, NS at 75 mL/hr, Rocephin at 200 mL/hr.    NUTRITION - FOCUSED PHYSICAL EXAM:  Flowsheet Row Most Recent Value  Orbital Region No depletion  Upper Arm Region Mild depletion  Thoracic and Lumbar Region Unable to assess  Buccal Region Mild depletion  Temple Region Mild depletion  Clavicle Bone Region Moderate depletion  Clavicle and Acromion Bone Region Moderate depletion  Scapular Bone Region Mild depletion  Dorsal Hand Mild depletion  Patellar Region Moderate depletion  Anterior Thigh Region Moderate depletion  Posterior Calf Region Moderate depletion  Edema (RD Assessment) None  Hair Reviewed  Eyes Reviewed  Mouth Unable to assess  Skin Reviewed  Nails Reviewed       Diet Order:   Diet Order            Diet regular Room service appropriate? Yes; Fluid consistency: Thin  Diet effective now                 EDUCATION NEEDS:   Not appropriate for education at this time  Skin:  Skin Assessment: Skin Integrity Issues: Skin Integrity Issues:: Other (Comment) Other: Excoriated- buttocks, scrotum, sacrum  Last BM:  12/16  Height:   Ht Readings from Last 1 Encounters:  09/16/20 6' (1.829 m)    Weight:   Wt Readings from Last 1 Encounters:  09/16/20 80 kg  BMI:  Body mass index is 23.92 kg/m.  Estimated Nutritional Needs:   Kcal:  2000-2200  Protein:  105-120 grams  Fluid:  >2 L/day    Placido Sou, Dietetic Intern Pager: [phone] If unavailable: [phone]

## 2020-09-16 NOTE — Progress Notes (Signed)
PROGRESS NOTE    Dylan Weaver  MWN:[id number] DOB: [birth year] DOA: 09/14/2020 PCP: Patient, No Pcp Per    Brief Narrative:  54 y.o. male with history of alcohol abuse patient states that over the last couple months he has been drinking more than usual after his divorce.  Denies any suicidal ideation denies taking any medications physically denies taking Tylenol states he has been feeling weak last couple of days last drink was more than 24 hours and has been having difficulty to walk because of the weakness.  Denies losing consciousness denies any chest pain shortness of breath has not been eating well.  Patient lives with his mother.  Ambulance was called and patient was on the floor in an unkempt situation.  ED Course: In the ER patient is mildly tachycardic labs show elevated LFTs sodium of 129 thrombocytopenia and mild normocytic normochromic anemia with mild leukocytosis UA concerning for UTI CT head was unremarkable.  X-ray chest and pelvis was unremarkable.  EKG shows sinus tachycardia.  Patient on exam is tremulous tachycardic and weak.  Patient was started on CIWA protocol admitted for alcohol withdrawal generalized weakness likely from dehydration and deconditioning  Assessment & Plan:   Principal Problem:   Alcohol withdrawal (HCC) Active Problems:   Alcoholic hepatitis   Thrombocytopenia (HCC)   Normochromic normocytic anemia   Malnutrition of moderate degree  1. Alcohol withdrawal  1. patient has been placed on CIWA protocol  2. social work consulted.   3. CIWA score peaked to 14 this AM, Continue PRN ativan 4. Recheck cmp in AM 2. Generalized weakness with difficulty walking  1. Suspect could be from deconditioning.   2. CT cervical spine unremarkable, reviewed 3. Will consult PT 3. Klebsiella UTI present on admit   1. Urine culture notable for multi-drug sensitive klebsiella, resistant to ampicillin and nitrofurantoin 2. Pt is continued on rocephin for  now  3. Afebrile 4. Elevated LFTs likely from alcohol hepatitis.   1. LFT's continuing to trend down 2. Recommend continued ETOH cessation 3. Repeat cmp in AM  5. Normocytic normochromic anemia no old labs to compare.   1. Iron levels unremarkable 2. B12 levels within normal limits 3. Folate noted to be low at 4.3. Continue folic acid replacement 6. Thrombocytopenia likely from alcoholism 1. Plts trending down 2. Repeat cbc in AM 7. Hyponatremia   1. Suspect secondary to ETOH abuse as well as dehydration 2. Cont IVF hydration 3. Recheck cmp in AM   DVT prophylaxis: SCD's Code Status: Full Family Communication: Pt in room, family not at bedside  Status is: Observation  The patient will require care spanning > 2 midnights and should be moved to inpatient because: Ongoing diagnostic testing needed not appropriate for outpatient work up, IV treatments appropriate due to intensity of illness or inability to take PO and Inpatient level of care appropriate due to severity of illness  Dispo: The patient is from: Home              Anticipated d/c is to: Home              Anticipated d/c date is: > 3 days              Patient currently is not medically stable to d/c.       Consultants:     Procedures:     Antimicrobials: Anti-infectives (From admission, onward)   Start     Dose/Rate Route Frequency Ordered Stop   09/15/20 2000  cefTRIAXone (ROCEPHIN) 1 g in sodium chloride 0.9 % 100 mL IVPB        1 g 200 mL/hr over 30 Minutes Intravenous Every 24 hours 09/14/20 2205     09/14/20 2015  cefTRIAXone (ROCEPHIN) 1 g in sodium chloride 0.9 % 100 mL IVPB        1 g 200 mL/hr over 30 Minutes Intravenous  Once 09/14/20 2007 09/14/20 2210      Subjective: Sedated. Unable to assess  Objective: Vitals:   09/16/20 0203 09/16/20 0917 09/16/20 1105 09/16/20 1415  BP: 135/89 (!) 136/96 (!) 132/91 (!) 127/95  Pulse: 93 (!) 106 (!) 103 (!) 104  Resp: 20 (!) 22 (!) 24 (!) 25   Temp: 98.7 F (37.1 C) 98.9 F (37.2 C)  98.2 F (36.8 C)  TempSrc: Oral Oral  Oral  SpO2: 100% 100% 100% 100%  Weight:   80 kg   Height:   6' (1.829 m)     Intake/Output Summary (Last 24 hours) at 09/16/2020 1501 Last data filed at 09/16/2020 0700 Gross per 24 hour  Intake 240 ml  Output 425 ml  Net -185 ml   Filed Weights   09/16/20 1105  Weight: 80 kg    Examination: General exam: Asleep, laying in bed, in nad Respiratory system: Normal respiratory effort, no wheezing Cardiovascular system: regular rate, s1, s2 Gastrointestinal system: Soft, nondistended, positive BS Central nervous system: CN2-12 grossly intact, strength intact Extremities: Perfused, no clubbing Skin: Normal skin turgor, no notable skin lesions seen Psychiatry: Unable to assess given mentation  Data Reviewed: I have personally reviewed following labs and imaging studies  CBC: Recent Labs  Lab 09/14/20 1607 09/15/20 0458 09/16/20 0814  WBC 11.4* 12.5* 10.4  NEUTROABS 10.4* 11.2*  --   HGB 12.7* 12.0* 11.6*  HCT 37.7* 36.1* 35.0*  MCV 87.9 90.0 90.9  PLT 56* 47* 53*   Basic Metabolic Panel: Recent Labs  Lab 09/14/20 1607 09/15/20 0458 09/16/20 0814  NA 129* 130* 129*  K 3.9 3.2* 3.0*  CL 90* 94* 94*  CO2 21* 22 20*  GLUCOSE 126* 106* 80  BUN <5* 6 5*  CREATININE 0.76 0.81 0.68  CALCIUM 9.0 8.6* 8.9  MG  --   --  1.4*   GFR: Estimated Creatinine Clearance: 115.9 mL/min (by C-G formula based on SCr of 0.68 mg/dL). Liver Function Tests: Recent Labs  Lab 09/14/20 1607 09/15/20 0458 09/16/20 0814  AST 218* 173* 127*  ALT 58* 51* 43  ALKPHOS 436* 342* 288*  BILITOT 2.9* 3.5* 4.5*  PROT 9.4* 8.7* 8.3*  ALBUMIN 3.3* 3.0* 2.9*   No results for input(s): LIPASE, AMYLASE in the last 168 hours. No results for input(s): AMMONIA in the last 168 hours. Coagulation Profile: Recent Labs  Lab 09/15/20 0458  INR 1.0   Cardiac Enzymes: Recent Labs  Lab 09/14/20 1607  CKTOTAL  130   BNP (last 3 results) No results for input(s): PROBNP in the last 8760 hours. HbA1C: No results for input(s): HGBA1C in the last 72 hours. CBG: No results for input(s): GLUCAP in the last 168 hours. Lipid Profile: No results for input(s): CHOL, HDL, LDLCALC, TRIG, CHOLHDL, LDLDIRECT in the last 72 hours. Thyroid Function Tests: Recent Labs    09/14/20 2221  TSH 1.556   Anemia Panel: Recent Labs    09/14/20 2221  VITAMINB12 456  FOLATE 4.3*  FERRITIN 84  TIBC 329  IRON 204*  RETICCTPCT 1.6   Sepsis Labs: No results for  input(s): PROCALCITON, LATICACIDVEN in the last 168 hours.  Recent Results (from the past 240 hour(s))  Resp Panel by RT-PCR (Flu A&B, Covid) Nasopharyngeal Swab     Status: None   Collection Time: 09/14/20  4:07 PM   Specimen: Nasopharyngeal Swab; Nasopharyngeal(NP) swabs in vial transport medium  Result Value Ref Range Status   SARS Coronavirus 2 by RT PCR NEGATIVE NEGATIVE Final    Comment: (NOTE) SARS-CoV-2 target nucleic acids are NOT DETECTED.  The SARS-CoV-2 RNA is generally detectable in upper respiratory specimens during the acute phase of infection. The lowest concentration of SARS-CoV-2 viral copies this assay can detect is 138 copies/mL. A negative result does not preclude SARS-Cov-2 infection and should not be used as the sole basis for treatment or other patient management decisions. A negative result may occur with  improper specimen collection/handling, submission of specimen other than nasopharyngeal swab, presence of viral mutation(s) within the areas targeted by this assay, and inadequate number of viral copies(<138 copies/mL). A negative result must be combined with clinical observations, patient history, and epidemiological information. The expected result is Negative.  Fact Sheet for Patients:  BloggerCourse.com  Fact Sheet for Healthcare Providers:   SeriousBroker.it  This test is no t yet approved or cleared by the [place] FDA and  has been authorized for detection and/or diagnosis of SARS-CoV-2 by FDA under an Emergency Use Authorization (EUA). This EUA will remain  in effect (meaning this test can be used) for the duration of the COVID-19 declaration under Section 564(b)(1) of the Act, 21 U.S.C.section 360bbb-3(b)(1), unless the authorization is terminated  or revoked sooner.       Influenza A by PCR NEGATIVE NEGATIVE Final   Influenza B by PCR NEGATIVE NEGATIVE Final    Comment: (NOTE) The Xpert Xpress SARS-CoV-2/FLU/RSV plus assay is intended as an aid in the diagnosis of influenza from Nasopharyngeal swab specimens and should not be used as a sole basis for treatment. Nasal washings and aspirates are unacceptable for Xpert Xpress SARS-CoV-2/FLU/RSV testing.  Fact Sheet for Patients: BloggerCourse.com  Fact Sheet for Healthcare Providers: SeriousBroker.it  This test is not yet approved or cleared by the [place] FDA and has been authorized for detection and/or diagnosis of SARS-CoV-2 by FDA under an Emergency Use Authorization (EUA). This EUA will remain in effect (meaning this test can be used) for the duration of the COVID-19 declaration under Section 564(b)(1) of the Act, 21 U.S.C. section 360bbb-3(b)(1), unless the authorization is terminated or revoked.  Performed at [facility], [address], [place], [place] [address]   Urine Culture     Status: Abnormal   Collection Time: 09/14/20  6:22 PM   Specimen: Urine, Random  Result Value Ref Range Status   Specimen Description   Final    URINE, RANDOM Performed at [facility], [address], [place], [place] [address]    Special Requests   Final    NONE Performed at [facility], [address], [place],  [place] [address]    Culture >=100,000 COLONIES/mL KLEBSIELLA PNEUMONIAE (A)  Final   Report Status 09/16/2020 FINAL  Final   Organism ID, Bacteria KLEBSIELLA PNEUMONIAE (A)  Final      Susceptibility   Klebsiella pneumoniae - MIC*    AMPICILLIN RESISTANT Resistant     CEFAZOLIN <=4 SENSITIVE Sensitive     CEFEPIME <=0.12 SENSITIVE Sensitive     CEFTRIAXONE <=0.25 SENSITIVE Sensitive     CIPROFLOXACIN <=0.25 SENSITIVE Sensitive  GENTAMICIN <=1 SENSITIVE Sensitive     IMIPENEM <=0.25 SENSITIVE Sensitive     NITROFURANTOIN 64 INTERMEDIATE Intermediate     TRIMETH/SULFA <=20 SENSITIVE Sensitive     AMPICILLIN/SULBACTAM 4 SENSITIVE Sensitive     PIP/TAZO <=4 SENSITIVE Sensitive     * >=100,000 COLONIES/mL KLEBSIELLA PNEUMONIAE     Radiology Studies: DG Chest 2 View  Result Date: 09/14/2020 CLINICAL DATA:  54 year old male with weakness. EXAM: CHEST - 2 VIEW COMPARISON:  None FINDINGS: The heart size and mediastinal contours are within normal limits. Both lungs are clear. The visualized skeletal structures are unremarkable. IMPRESSION: No active cardiopulmonary disease. Electronically Signed   By: Elgie Collard M.D.   On: 09/14/2020 16:42   DG Pelvis 1-2 Views  Result Date: 09/14/2020 CLINICAL DATA:  Weakness. Fall. EXAM: PELVIS - 1-2 VIEW COMPARISON:  None. FINDINGS: The cortical margins of the bony pelvis are intact. No fracture. Pubic symphysis and sacroiliac joints are congruent. Both femoral heads are well-seated in the respective acetabula. IMPRESSION: No pelvic fracture. Electronically Signed   By: Narda Rutherford M.D.   On: 09/14/2020 16:42   CT Head Wo Contrast  Result Date: 09/14/2020 CLINICAL DATA:  Fall.  Alcohol abuse. EXAM: CT HEAD WITHOUT CONTRAST TECHNIQUE: Contiguous axial images were obtained from the base of the skull through the vertex without intravenous contrast. COMPARISON:  None. FINDINGS: Brain: No evidence of acute infarction, hemorrhage, hydrocephalus,  extra-axial collection or mass lesion/mass effect. Mild generalized cerebral atrophy, advanced for age. Vascular: Atherosclerotic vascular calcification of the carotid siphons. No hyperdense vessel. Skull: Normal. Negative for fracture or focal lesion. Sinuses/Orbits: No acute finding. Other: None. IMPRESSION: 1. No acute intracranial abnormality. 2. Age advanced cerebral atrophy. Electronically Signed   By: Obie Dredge M.D.   On: 09/14/2020 17:51   CT CERVICAL SPINE WO CONTRAST  Result Date: 09/15/2020 CLINICAL DATA:  54 year old male status post fall. Found down. Tremors. Possible ETOH abuse. EXAM: CT CERVICAL SPINE WITHOUT CONTRAST TECHNIQUE: Multidetector CT imaging of the cervical spine was performed without intravenous contrast. Multiplanar CT image reconstructions were also generated. COMPARISON:  Head CT 09/14/2020. FINDINGS: Alignment: Mild reversal of cervical lordosis. Cervicothoracic junction alignment is within normal limits. Bilateral posterior element alignment is within normal limits. Skull base and vertebrae: Visualized skull base is intact. No atlanto-occipital dissociation. C1-C2 appear aligned and intact. No acute osseous abnormality identified. Advanced left TMJ degeneration, possibly posttraumatic. Soft tissues and spinal canal: No prevertebral fluid or swelling. No visible canal hematoma. Negative noncontrast visible neck soft tissues. Disc levels: Multilevel cervical disc degeneration including vacuum disc at C3-C4. Associated intermittent endplate spurring. No cervical spinal stenosis suspected. Upper chest: Chronic left clavicle deformity partially visible. Visible upper thoracic levels appear intact. Apical lung emphysema. Negative visible noncontrast thoracic inlet and superior mediastinum. Other: Negative visible posterior fossa. IMPRESSION: 1. No acute traumatic injury identified in the cervical spine. 2. Multilevel cervical disc and endplate degeneration. 3. Upper lung  emphysema suspected. Electronically Signed   By: Odessa Fleming M.D.   On: 09/15/2020 04:39    Scheduled Meds: . folic acid  1 mg Oral Daily  . influenza vac split quadrivalent PF  0.5 mL Intramuscular Tomorrow-1000  . LORazepam  0-4 mg Intravenous Q6H   Followed by  . LORazepam  0-4 mg Intravenous Q12H  . multivitamin with minerals  1 tablet Oral Daily  . thiamine  100 mg Oral Daily   Or  . thiamine  100 mg Intravenous Daily   Continuous Infusions: .  sodium chloride 75 mL/hr at 09/16/20 1147  . cefTRIAXone (ROCEPHIN)  IV Stopped (09/15/20 2310)     LOS: 1 day   Rickey Barbara, MD [facility] Pager On Amion  If 7PM-7AM, please contact night-coverage 09/16/2020, 3:01 PM

## 2020-09-17 LAB — COMPREHENSIVE METABOLIC PANEL
ALT: 44 U/L (ref 0–44)
AST: 148 U/L — ABNORMAL HIGH (ref 15–41)
Albumin: 2.7 g/dL — ABNORMAL LOW (ref 3.5–5.0)
Alkaline Phosphatase: 283 U/L — ABNORMAL HIGH (ref 38–126)
Anion gap: 12 (ref 5–15)
BUN: <5 mg/dL — ABNORMAL LOW (ref 6–20)
CO2: 19 mmol/L — ABNORMAL LOW (ref 22–32)
Calcium: 8.3 mg/dL — ABNORMAL LOW (ref 8.9–10.3)
Chloride: 94 mmol/L — ABNORMAL LOW (ref 98–111)
Creatinine, Ser: 0.55 mg/dL — ABNORMAL LOW (ref 0.61–1.24)
GFR, Estimated: >60 mL/min (ref >60)
Glucose, Bld: 88 mg/dL (ref 70–99)
Potassium: 2.8 mmol/L — ABNORMAL LOW (ref 3.5–5.1)
Sodium: 125 mmol/L — ABNORMAL LOW (ref 135–145)
Total Bilirubin: 5.3 mg/dL — ABNORMAL HIGH (ref 0.3–1.2)
Total Protein: 7.9 g/dL (ref 6.5–8.1)

## 2020-09-17 LAB — CBC
HCT: 32.1 % — ABNORMAL LOW (ref 39.0–52.0)
Hemoglobin: 10.7 g/dL — ABNORMAL LOW (ref 13.0–17.0)
MCH: 30 pg (ref 26.0–34.0)
MCHC: 33.3 g/dL (ref 30.0–36.0)
MCV: 89.9 fL (ref 80.0–100.0)
Platelets: 66 10*3/uL — ABNORMAL LOW (ref 150–400)
RBC: 3.57 MIL/uL — ABNORMAL LOW (ref 4.22–5.81)
RDW: 16.9 % — ABNORMAL HIGH (ref 11.5–15.5)
WBC: 4.2 10*3/uL (ref 4.0–10.5)
nRBC: 0 % (ref 0.0–0.2)

## 2020-09-17 LAB — OSMOLALITY: Osmolality: 271 mOsm/kg — ABNORMAL LOW (ref 275–295)

## 2020-09-17 LAB — PHOSPHORUS: Phosphorus: 2.4 mg/dL — ABNORMAL LOW (ref 2.5–4.6)

## 2020-09-17 LAB — MAGNESIUM: Magnesium: 1.3 mg/dL — ABNORMAL LOW (ref 1.7–2.4)

## 2020-09-17 LAB — OSMOLALITY, URINE: Osmolality, Ur: 274 mOsm/kg — ABNORMAL LOW (ref 300–900)

## 2020-09-17 MED ORDER — CEFAZOLIN SODIUM-DEXTROSE 1-4 GM/50ML-% IV SOLN
1.0000 g | Freq: Three times a day (TID) | INTRAVENOUS | Status: DC
  Administered 2020-09-17 – 2020-09-18 (×4): 1 g via INTRAVENOUS
  Filled 2020-09-17 (×8): qty 50

## 2020-09-17 MED ORDER — POTASSIUM CHLORIDE CRYS ER 20 MEQ PO TBCR
40.0000 meq | EXTENDED_RELEASE_TABLET | Freq: Two times a day (BID) | ORAL | Status: AC
  Administered 2020-09-17 (×2): 40 meq via ORAL
  Filled 2020-09-17 (×2): qty 2

## 2020-09-17 MED ORDER — CEPHALEXIN 500 MG PO CAPS: 500.0000 mg | ORAL_CAPSULE | Freq: Four times a day (QID) | ORAL | Status: DC

## 2020-09-17 MED ORDER — MAGNESIUM SULFATE 4 GM/100ML IV SOLN
4.0000 g | Freq: Once | INTRAVENOUS | Status: AC
  Administered 2020-09-17: 11:00:00 4 g via INTRAVENOUS
  Filled 2020-09-17: qty 100

## 2020-09-17 NOTE — Plan of Care (Signed)

## 2020-09-17 NOTE — Progress Notes (Signed)
PT Cancellation Note  Patient Details Name: Dylan Weaver MRN: [id number] DOB: 01-17-1966   Cancelled Treatment:    Reason Eval/Treat Not Completed: Fatigue/lethargy limiting ability to participate Pt declines to participate.  "I don't feel up to it today."  "I'll try tomorrow."   Maida Sale E 09/17/2020, 3:00 PM Paulino Door, DPT Acute Rehabilitation Services Pager: [phone] Office: [phone]

## 2020-09-17 NOTE — Progress Notes (Signed)
PROGRESS NOTE    Dylan Weaver  KXF:[id number] DOB: 1966/04/28 DOA: 09/14/2020 PCP: Patient, No Pcp Per    Brief Narrative:  54 y.o. male with history of alcohol abuse patient states that over the last couple months he has been drinking more than usual after his divorce.  Denies any suicidal ideation denies taking any medications physically denies taking Tylenol states he has been feeling weak last couple of days last drink was more than 24 hours and has been having difficulty to walk because of the weakness.  Denies losing consciousness denies any chest pain shortness of breath has not been eating well.  Patient lives with his mother.  Ambulance was called and patient was on the floor in an unkempt situation.  ED Course: In the ER patient is mildly tachycardic labs show elevated LFTs sodium of 129 thrombocytopenia and mild normocytic normochromic anemia with mild leukocytosis UA concerning for UTI CT head was unremarkable.  X-ray chest and pelvis was unremarkable.  EKG shows sinus tachycardia.  Patient on exam is tremulous tachycardic and weak.  Patient was started on CIWA protocol admitted for alcohol withdrawal generalized weakness likely from dehydration and deconditioning  Assessment & Plan:   Principal Problem:   Alcohol withdrawal (HCC) Active Problems:   Alcoholic hepatitis   Thrombocytopenia (HCC)   Normochromic normocytic anemia   Malnutrition of moderate degree  1. Alcohol withdrawal  1. patient has been placed on CIWA protocol  2. social work consulted.   3. CIWA score slowly improving, peaked to 10 this AM, Continue PRN ativan 4. Recheck cmp in AM 2. Generalized weakness with difficulty walking  1. Suspect could be from deconditioning.   2. CT cervical spine unremarkable, reviewed 3. PT was consulted 3. Klebsiella UTI present on admit   1. Urine culture notable for multi-drug sensitive klebsiella, resistant to ampicillin and nitrofurantoin 2. Pt is continued  on rocephin for now  3. Currently afebrile 4. Elevated LFTs likely from alcohol hepatitis.   1. LFT's continuing to trend down 2. Recommend continued ETOH cessation 3. Repeat cmp in AM  5. Normocytic normochromic anemia no old labs to compare.   1. Iron levels unremarkable 2. B12 levels within normal limits 3. Folate noted to be low at 4.3. Will continue with folic acid replacement 6. Thrombocytopenia likely from alcoholism 1. Plts trending down 2. recheck cbc in AM 7. Hyponatremia   1. Suspect secondary to ETOH abuse as well as dehydration 2. Was given NS, trending down to 125 today. Will hold IVF today 3. Recheck cmp in AM 8. Hypokalemia 1. Will replace 2. Repeat lytes in AM 9. Hypomagnesemia 1. Replaced 2. Recheck lytes in AM   DVT prophylaxis: SCD's Code Status: Full Family Communication: Pt in room, family not at bedside  Status is: Observation  The patient will require care spanning > 2 midnights and should be moved to inpatient because: Ongoing diagnostic testing needed not appropriate for outpatient work up, IV treatments appropriate due to intensity of illness or inability to take PO and Inpatient level of care appropriate due to severity of illness  Dispo: The patient is from: Home              Anticipated d/c is to: Home              Anticipated d/c date is: > 3 days              Patient currently is not medically stable to d/c.  Consultants:  Procedures:     Antimicrobials: Anti-infectives (From admission, onward)   Start     Dose/Rate Route Frequency Ordered Stop   09/17/20 2000  cephALEXin (KEFLEX) capsule 500 mg  Status:  Discontinued        500 mg Oral 4 times daily 09/17/20 1011 09/17/20 1013   09/17/20 2000  ceFAZolin (ANCEF) IVPB 1 g/50 mL premix        1 g 100 mL/hr over 30 Minutes Intravenous Every 8 hours 09/17/20 1014     09/15/20 2000  cefTRIAXone (ROCEPHIN) 1 g in sodium chloride 0.9 % 100 mL IVPB  Status:  Discontinued        1  g 200 mL/hr over 30 Minutes Intravenous Every 24 hours 09/14/20 2205 09/17/20 1011   09/14/20 2015  cefTRIAXone (ROCEPHIN) 1 g in sodium chloride 0.9 % 100 mL IVPB        1 g 200 mL/hr over 30 Minutes Intravenous  Once 09/14/20 2007 09/14/20 2210      Subjective: Unable to assess given mentation  Objective: Vitals:   09/16/20 2143 09/17/20 0000 09/17/20 0217 09/17/20 0537  BP: 123/80 128/89 (!) 136/95 (!) 137/93  Pulse: (!) 109 (!) 110 100 100  Resp: 20  20 20   Temp: 99 F (37.2 C)  98.2 F (36.8 C) 98.1 F (36.7 C)  TempSrc: Oral  Oral Oral  SpO2: 98%  100% 100%  Weight:      Height:        Intake/Output Summary (Last 24 hours) at 09/17/2020 1613 Last data filed at 09/17/2020 1400 Gross per 24 hour  Intake 2745.98 ml  Output 1550 ml  Net 1195.98 ml   Filed Weights   09/16/20 1105  Weight: 80 kg    Examination: General exam: asleep, in no acute distress Respiratory system: normal chest rise, clear, no audible wheezing Cardiovascular system: regular rhythm, s1-s2 Gastrointestinal system: Nondistended, nontender, pos BS Central nervous system: No seizures, no tremors Extremities: No cyanosis, no joint deformities Skin: No rashes, no pallor Psychiatry: unable to assess given mentation  Data Reviewed: I have personally reviewed following labs and imaging studies  CBC: Recent Labs  Lab 09/14/20 1607 09/15/20 0458 09/16/20 0814 09/17/20 0601  WBC 11.4* 12.5* 10.4 4.2  NEUTROABS 10.4* 11.2*  --   --   HGB 12.7* 12.0* 11.6* 10.7*  HCT 37.7* 36.1* 35.0* 32.1*  MCV 87.9 90.0 90.9 89.9  PLT 56* 47* 53* 66*   Basic Metabolic Panel: Recent Labs  Lab 09/14/20 1607 09/15/20 0458 09/16/20 0814 09/17/20 0601  NA 129* 130* 129* 125*  K 3.9 3.2* 3.0* 2.8*  CL 90* 94* 94* 94*  CO2 21* 22 20* 19*  GLUCOSE 126* 106* 80 88  BUN <5* 6 5* <5*  CREATININE 0.76 0.81 0.68 0.55*  CALCIUM 9.0 8.6* 8.9 8.3*  MG  --   --  1.4* 1.3*  PHOS  --   --   --  2.4*    GFR: Estimated Creatinine Clearance: 115.9 mL/min (A) (by C-G formula based on SCr of 0.55 mg/dL (L)). Liver Function Tests: Recent Labs  Lab 09/14/20 1607 09/15/20 0458 09/16/20 0814 09/17/20 0601  AST 218* 173* 127* 148*  ALT 58* 51* 43 44  ALKPHOS 436* 342* 288* 283*  BILITOT 2.9* 3.5* 4.5* 5.3*  PROT 9.4* 8.7* 8.3* 7.9  ALBUMIN 3.3* 3.0* 2.9* 2.7*   No results for input(s): LIPASE, AMYLASE in the last 168 hours. No results for input(s): AMMONIA in the last 168  hours. Coagulation Profile: Recent Labs  Lab 09/15/20 0458  INR 1.0   Cardiac Enzymes: Recent Labs  Lab 09/14/20 1607  CKTOTAL 130   BNP (last 3 results) No results for input(s): PROBNP in the last 8760 hours. HbA1C: No results for input(s): HGBA1C in the last 72 hours. CBG: No results for input(s): GLUCAP in the last 168 hours. Lipid Profile: No results for input(s): CHOL, HDL, LDLCALC, TRIG, CHOLHDL, LDLDIRECT in the last 72 hours. Thyroid Function Tests: Recent Labs    09/14/20 2221  TSH 1.556   Anemia Panel: Recent Labs    09/14/20 2221  VITAMINB12 456  FOLATE 4.3*  FERRITIN 84  TIBC 329  IRON 204*  RETICCTPCT 1.6   Sepsis Labs: No results for input(s): PROCALCITON, LATICACIDVEN in the last 168 hours.  Recent Results (from the past 240 hour(s))  Resp Panel by RT-PCR (Flu A&B, Covid) Nasopharyngeal Swab     Status: None   Collection Time: 09/14/20  4:07 PM   Specimen: Nasopharyngeal Swab; Nasopharyngeal(NP) swabs in vial transport medium  Result Value Ref Range Status   SARS Coronavirus 2 by RT PCR NEGATIVE NEGATIVE Final    Comment: (NOTE) SARS-CoV-2 target nucleic acids are NOT DETECTED.  The SARS-CoV-2 RNA is generally detectable in upper respiratory specimens during the acute phase of infection. The lowest concentration of SARS-CoV-2 viral copies this assay can detect is 138 copies/mL. A negative result does not preclude SARS-Cov-2 infection and should not be used as the sole  basis for treatment or other patient management decisions. A negative result may occur with  improper specimen collection/handling, submission of specimen other than nasopharyngeal swab, presence of viral mutation(s) within the areas targeted by this assay, and inadequate number of viral copies(<138 copies/mL). A negative result must be combined with clinical observations, patient history, and epidemiological information. The expected result is Negative.  Fact Sheet for Patients:  BloggerCourse.com  Fact Sheet for Healthcare Providers:  SeriousBroker.it  This test is no t yet approved or cleared by the [place] FDA and  has been authorized for detection and/or diagnosis of SARS-CoV-2 by FDA under an Emergency Use Authorization (EUA). This EUA will remain  in effect (meaning this test can be used) for the duration of the COVID-19 declaration under Section 564(b)(1) of the Act, 21 U.S.C.section 360bbb-3(b)(1), unless the authorization is terminated  or revoked sooner.       Influenza A by PCR NEGATIVE NEGATIVE Final   Influenza B by PCR NEGATIVE NEGATIVE Final    Comment: (NOTE) The Xpert Xpress SARS-CoV-2/FLU/RSV plus assay is intended as an aid in the diagnosis of influenza from Nasopharyngeal swab specimens and should not be used as a sole basis for treatment. Nasal washings and aspirates are unacceptable for Xpert Xpress SARS-CoV-2/FLU/RSV testing.  Fact Sheet for Patients: BloggerCourse.com  Fact Sheet for Healthcare Providers: SeriousBroker.it  This test is not yet approved or cleared by the [place] FDA and has been authorized for detection and/or diagnosis of SARS-CoV-2 by FDA under an Emergency Use Authorization (EUA). This EUA will remain in effect (meaning this test can be used) for the duration of the COVID-19 declaration under Section 564(b)(1) of the Act,  21 U.S.C. section 360bbb-3(b)(1), unless the authorization is terminated or revoked.  Performed at [facility], [address] [place], [place] [address]   Urine Culture     Status: Abnormal   Collection Time: 09/14/20  6:22 PM   Specimen: Urine, Random  Result Value Ref Range Status  Specimen Description   Final    URINE, RANDOM Performed at [facility], [address], [place], [place] [address]    Special Requests   Final    NONE Performed at [facility], [address], [place], [place] [address]    Culture >=100,000 COLONIES/mL KLEBSIELLA PNEUMONIAE (A)  Final   Report Status 09/16/2020 FINAL  Final   Organism ID, Bacteria KLEBSIELLA PNEUMONIAE (A)  Final      Susceptibility   Klebsiella pneumoniae - MIC*    AMPICILLIN RESISTANT Resistant     CEFAZOLIN <=4 SENSITIVE Sensitive     CEFEPIME <=0.12 SENSITIVE Sensitive     CEFTRIAXONE <=0.25 SENSITIVE Sensitive     CIPROFLOXACIN <=0.25 SENSITIVE Sensitive     GENTAMICIN <=1 SENSITIVE Sensitive     IMIPENEM <=0.25 SENSITIVE Sensitive     NITROFURANTOIN 64 INTERMEDIATE Intermediate     TRIMETH/SULFA <=20 SENSITIVE Sensitive     AMPICILLIN/SULBACTAM 4 SENSITIVE Sensitive     PIP/TAZO <=4 SENSITIVE Sensitive     * >=100,000 COLONIES/mL KLEBSIELLA PNEUMONIAE     Radiology Studies: No results found.  Scheduled Meds: . folic acid  1 mg Oral Daily  . influenza vac split quadrivalent PF  0.5 mL Intramuscular Tomorrow-1000  . LORazepam  0-4 mg Intravenous Q12H  . multivitamin with minerals  1 tablet Oral Daily  . potassium chloride  40 mEq Oral BID  . thiamine  100 mg Oral Daily   Or  . thiamine  100 mg Intravenous Daily   Continuous Infusions: . sodium chloride 75 mL/hr at 09/17/20 0141  .  ceFAZolin (ANCEF) IV       LOS: 2 days   Rickey Barbara, MD [facility] Pager On Amion  If 7PM-7AM, please contact night-coverage 09/17/2020, 4:13 PM

## 2020-09-18 LAB — COMPREHENSIVE METABOLIC PANEL
ALT: 48 U/L — ABNORMAL HIGH (ref 0–44)
AST: 150 U/L — ABNORMAL HIGH (ref 15–41)
Albumin: 2.7 g/dL — ABNORMAL LOW (ref 3.5–5.0)
Alkaline Phosphatase: 270 U/L — ABNORMAL HIGH (ref 38–126)
Anion gap: 12 (ref 5–15)
BUN: <5 mg/dL — ABNORMAL LOW (ref 6–20)
CO2: 18 mmol/L — ABNORMAL LOW (ref 22–32)
Calcium: 8.5 mg/dL — ABNORMAL LOW (ref 8.9–10.3)
Chloride: 98 mmol/L (ref 98–111)
Creatinine, Ser: 0.57 mg/dL — ABNORMAL LOW (ref 0.61–1.24)
GFR, Estimated: >60 mL/min (ref >60)
Glucose, Bld: 110 mg/dL — ABNORMAL HIGH (ref 70–99)
Potassium: 3.3 mmol/L — ABNORMAL LOW (ref 3.5–5.1)
Sodium: 128 mmol/L — ABNORMAL LOW (ref 135–145)
Total Bilirubin: 4.2 mg/dL — ABNORMAL HIGH (ref 0.3–1.2)
Total Protein: 7.8 g/dL (ref 6.5–8.1)

## 2020-09-18 LAB — CBC
HCT: 31.4 % — ABNORMAL LOW (ref 39.0–52.0)
Hemoglobin: 10.1 g/dL — ABNORMAL LOW (ref 13.0–17.0)
MCH: 29.6 pg (ref 26.0–34.0)
MCHC: 32.2 g/dL (ref 30.0–36.0)
MCV: 92.1 fL (ref 80.0–100.0)
Platelets: 112 10*3/uL — ABNORMAL LOW (ref 150–400)
RBC: 3.41 MIL/uL — ABNORMAL LOW (ref 4.22–5.81)
RDW: 18.3 % — ABNORMAL HIGH (ref 11.5–15.5)
WBC: 4.3 10*3/uL (ref 4.0–10.5)
nRBC: 0 % (ref 0.0–0.2)

## 2020-09-18 LAB — MAGNESIUM: Magnesium: 1.8 mg/dL (ref 1.7–2.4)

## 2020-09-18 MED ORDER — POTASSIUM CHLORIDE CRYS ER 20 MEQ PO TBCR
40.0000 meq | EXTENDED_RELEASE_TABLET | Freq: Two times a day (BID) | ORAL | Status: AC
  Administered 2020-09-18 (×2): 40 meq via ORAL
  Filled 2020-09-18 (×2): qty 2

## 2020-09-18 MED ORDER — MAGNESIUM SULFATE 2 GM/50ML IV SOLN
2.0000 g | Freq: Once | INTRAVENOUS | Status: AC
  Administered 2020-09-18: 09:00:00 2 g via INTRAVENOUS
  Filled 2020-09-18: qty 50

## 2020-09-18 NOTE — Progress Notes (Signed)
PROGRESS NOTE    [name] Christopher Christoffel  ZOX:[id number] DOB: 12-25-1965 DOA: 09/14/2020 PCP: Patient, No Pcp Per    Brief Narrative:  54 y.o. male with history of alcohol abuse patient states that over the last couple months he has been drinking more than usual after his divorce.  Denies any suicidal ideation denies taking any medications physically denies taking Tylenol states he has been feeling weak last couple of days last drink was more than 24 hours and has been having difficulty to walk because of the weakness.  Denies losing consciousness denies any chest pain shortness of breath has not been eating well.  Patient lives with his mother.  Ambulance was called and patient was on the floor in an unkempt situation.  ED Course: In the ER patient is mildly tachycardic labs show elevated LFTs sodium of 129 thrombocytopenia and mild normocytic normochromic anemia with mild leukocytosis UA concerning for UTI CT head was unremarkable.  X-ray chest and pelvis was unremarkable.  EKG shows sinus tachycardia.  Patient on exam is tremulous tachycardic and weak.  Patient was started on CIWA protocol admitted for alcohol withdrawal generalized weakness likely from dehydration and deconditioning  Assessment & Plan:   Principal Problem:   Alcohol withdrawal (HCC) Active Problems:   Alcoholic hepatitis   Thrombocytopenia (HCC)   Normochromic normocytic anemia   Malnutrition of moderate degree  1. Alcohol withdrawal  1. patient has been placed on CIWA protocol  2. social work consulted.   3. CIWA score is improving. This AM, no visible shaking noted. Pt is alert and oriented x3 4. Cont to f/u on cmp 2. Generalized weakness with difficulty walking  1. Suspect could be from deconditioning.   2. CT cervical spine unremarkable, reviewed 3. PT evaluated and recommends SNF. Have consulted TOC 3. Klebsiella UTI present on admit   1. Urine culture notable for multi-drug sensitive klebsiella, resistant  to ampicillin and nitrofurantoin 2. Pt had beencontinued on rocephin for now  3. Remains hemodynamically stable 4. Elevated LFTs likely from alcohol hepatitis.   1. LFT's overall stable 2. Recommend continued ETOH cessation 3. Cont to follow CMP 5. Normocytic normochromic anemia no old labs to compare.   1. Iron levels unremarkable 2. B12 levels within normal limits 3. Folate noted to be low at 4.3. Will continue with folic acid replacement 6. Thrombocytopenia likely from alcoholism 1. Plts now trending back up 2. recheck cbc in AM 7. Hyponatremia   1. Suspect secondary to ETOH abuse as well as dehydration 2. IVF now on hold, sodium improving 3. Repeat bmet in AM 8. Hypokalemia 1. Remains low, will replace 2. Repeat lytes in AM 9. Hypomagnesemia 1. replaced   DVT prophylaxis: SCD's Code Status: Full Family Communication: Pt in room, family not at bedside  Status is: Observation  The patient will require care spanning > 2 midnights and should be moved to inpatient because: Ongoing diagnostic testing needed not appropriate for outpatient work up, IV treatments appropriate due to intensity of illness or inability to take PO and Inpatient level of care appropriate due to severity of illness  Dispo: The patient is from: Home              Anticipated d/c is to: SNF              Anticipated d/c date is: 2 days              Patient currently is not medically stable to d/c.  Consultants:  Procedures:     Antimicrobials: Anti-infectives (From admission, onward)   Start     Dose/Rate Route Frequency Ordered Stop   09/17/20 2000  cephALEXin (KEFLEX) capsule 500 mg  Status:  Discontinued        500 mg Oral 4 times daily 09/17/20 1011 09/17/20 1013   09/17/20 2000  ceFAZolin (ANCEF) IVPB 1 g/50 mL premix        1 g 100 mL/hr over 30 Minutes Intravenous Every 8 hours 09/17/20 1014     09/15/20 2000  cefTRIAXone (ROCEPHIN) 1 g in sodium chloride 0.9 % 100 mL IVPB  Status:   Discontinued        1 g 200 mL/hr over 30 Minutes Intravenous Every 24 hours 09/14/20 2205 09/17/20 1011   09/14/20 2015  cefTRIAXone (ROCEPHIN) 1 g in sodium chloride 0.9 % 100 mL IVPB        1 g 200 mL/hr over 30 Minutes Intravenous  Once 09/14/20 2007 09/14/20 2210      Subjective: Reports feeling better  Objective: Vitals:   09/17/20 2230 09/18/20 0519 09/18/20 1021 09/18/20 1242  BP: 124/84 105/77 124/86 121/81  Pulse: 90 92 89 84  Resp: 20 20 20 20   Temp: 99.2 F (37.3 C) 98.5 F (36.9 C) (!) 97.2 F (36.2 C) 97.8 F (36.6 C)  TempSrc: Oral Oral Oral Oral  SpO2: 97% 96% 97% 97%  Weight:      Height:        Intake/Output Summary (Last 24 hours) at 09/18/2020 1703 Last data filed at 09/18/2020 1500 Gross per 24 hour  Intake 465.05 ml  Output 1300 ml  Net -834.95 ml   Filed Weights   09/16/20 1105  Weight: 80 kg    Examination: General exam: Conversant, in no acute distress Respiratory system: normal chest rise, clear, no audible wheezing Cardiovascular system: regular rhythm, s1-s2 Gastrointestinal system: Nondistended, nontender, pos BS Central nervous system: No seizures, no tremors Extremities: No cyanosis, no joint deformities Skin: No rashes, no pallor Psychiatry: Affect normal // no auditory hallucinations   Data Reviewed: I have personally reviewed following labs and imaging studies  CBC: Recent Labs  Lab 09/14/20 1607 09/15/20 0458 09/16/20 0814 09/17/20 0601 09/18/20 0544  WBC 11.4* 12.5* 10.4 4.2 4.3  NEUTROABS 10.4* 11.2*  --   --   --   HGB 12.7* 12.0* 11.6* 10.7* 10.1*  HCT 37.7* 36.1* 35.0* 32.1* 31.4*  MCV 87.9 90.0 90.9 89.9 92.1  PLT 56* 47* 53* 66* 112*   Basic Metabolic Panel: Recent Labs  Lab 09/14/20 1607 09/15/20 0458 09/16/20 0814 09/17/20 0601 09/18/20 0544  NA 129* 130* 129* 125* 128*  K 3.9 3.2* 3.0* 2.8* 3.3*  CL 90* 94* 94* 94* 98  CO2 21* 22 20* 19* 18*  GLUCOSE 126* 106* 80 88 110*  BUN <5* 6 5* <5* <5*   CREATININE 0.76 0.81 0.68 0.55* 0.57*  CALCIUM 9.0 8.6* 8.9 8.3* 8.5*  MG  --   --  1.4* 1.3* 1.8  PHOS  --   --   --  2.4*  --    GFR: Estimated Creatinine Clearance: 115.9 mL/min (A) (by C-G formula based on SCr of 0.57 mg/dL (L)). Liver Function Tests: Recent Labs  Lab 09/14/20 1607 09/15/20 0458 09/16/20 0814 09/17/20 0601 09/18/20 0544  AST 218* 173* 127* 148* 150*  ALT 58* 51* 43 44 48*  ALKPHOS 436* 342* 288* 283* 270*  BILITOT 2.9* 3.5* 4.5* 5.3* 4.2*  PROT 9.4* 8.7*  8.3* 7.9 7.8  ALBUMIN 3.3* 3.0* 2.9* 2.7* 2.7*   No results for input(s): LIPASE, AMYLASE in the last 168 hours. No results for input(s): AMMONIA in the last 168 hours. Coagulation Profile: Recent Labs  Lab 09/15/20 0458  INR 1.0   Cardiac Enzymes: Recent Labs  Lab 09/14/20 1607  CKTOTAL 130   BNP (last 3 results) No results for input(s): PROBNP in the last 8760 hours. HbA1C: No results for input(s): HGBA1C in the last 72 hours. CBG: No results for input(s): GLUCAP in the last 168 hours. Lipid Profile: No results for input(s): CHOL, HDL, LDLCALC, TRIG, CHOLHDL, LDLDIRECT in the last 72 hours. Thyroid Function Tests: No results for input(s): TSH, T4TOTAL, FREET4, T3FREE, THYROIDAB in the last 72 hours. Anemia Panel: No results for input(s): VITAMINB12, FOLATE, FERRITIN, TIBC, IRON, RETICCTPCT in the last 72 hours. Sepsis Labs: No results for input(s): PROCALCITON, LATICACIDVEN in the last 168 hours.  Recent Results (from the past 240 hour(s))  Resp Panel by RT-PCR (Flu A&B, Covid) Nasopharyngeal Swab     Status: None   Collection Time: 09/14/20  4:07 PM   Specimen: Nasopharyngeal Swab; Nasopharyngeal(NP) swabs in vial transport medium  Result Value Ref Range Status   SARS Coronavirus 2 by RT PCR NEGATIVE NEGATIVE Final    Comment: (NOTE) SARS-CoV-2 target nucleic acids are NOT DETECTED.  The SARS-CoV-2 RNA is generally detectable in upper respiratory specimens during the acute phase  of infection. The lowest concentration of SARS-CoV-2 viral copies this assay can detect is 138 copies/mL. A negative result does not preclude SARS-Cov-2 infection and should not be used as the sole basis for treatment or other patient management decisions. A negative result may occur with  improper specimen collection/handling, submission of specimen other than nasopharyngeal swab, presence of viral mutation(s) within the areas targeted by this assay, and inadequate number of viral copies(<138 copies/mL). A negative result must be combined with clinical observations, patient history, and epidemiological information. The expected result is Negative.  Fact Sheet for Patients:  BloggerCourse.com  Fact Sheet for Healthcare Providers:  SeriousBroker.it  This test is no t yet approved or cleared by the [place] FDA and  has been authorized for detection and/or diagnosis of SARS-CoV-2 by FDA under an Emergency Use Authorization (EUA). This EUA will remain  in effect (meaning this test can be used) for the duration of the COVID-19 declaration under Section 564(b)(1) of the Act, 21 U.S.C.section 360bbb-3(b)(1), unless the authorization is terminated  or revoked sooner.       Influenza A by PCR NEGATIVE NEGATIVE Final   Influenza B by PCR NEGATIVE NEGATIVE Final    Comment: (NOTE) The Xpert Xpress SARS-CoV-2/FLU/RSV plus assay is intended as an aid in the diagnosis of influenza from Nasopharyngeal swab specimens and should not be used as a sole basis for treatment. Nasal washings and aspirates are unacceptable for Xpert Xpress SARS-CoV-2/FLU/RSV testing.  Fact Sheet for Patients: BloggerCourse.com  Fact Sheet for Healthcare Providers: SeriousBroker.it  This test is not yet approved or cleared by the [place] FDA and has been authorized for detection and/or diagnosis of  SARS-CoV-2 by FDA under an Emergency Use Authorization (EUA). This EUA will remain in effect (meaning this test can be used) for the duration of the COVID-19 declaration under Section 564(b)(1) of the Act, 21 U.S.C. section 360bbb-3(b)(1), unless the authorization is terminated or revoked.  Performed at [facility], [address], [place], [place] [address]   Urine Culture  Status: Abnormal   Collection Time: 09/14/20  6:22 PM   Specimen: Urine, Random  Result Value Ref Range Status   Specimen Description   Final    URINE, RANDOM Performed at [facility], [address], [place], [place] [address]    Special Requests   Final    NONE Performed at [facility], [address], [place], [place] [address]    Culture >=100,000 COLONIES/mL KLEBSIELLA PNEUMONIAE (A)  Final   Report Status 09/16/2020 FINAL  Final   Organism ID, Bacteria KLEBSIELLA PNEUMONIAE (A)  Final      Susceptibility   Klebsiella pneumoniae - MIC*    AMPICILLIN RESISTANT Resistant     CEFAZOLIN <=4 SENSITIVE Sensitive     CEFEPIME <=0.12 SENSITIVE Sensitive     CEFTRIAXONE <=0.25 SENSITIVE Sensitive     CIPROFLOXACIN <=0.25 SENSITIVE Sensitive     GENTAMICIN <=1 SENSITIVE Sensitive     IMIPENEM <=0.25 SENSITIVE Sensitive     NITROFURANTOIN 64 INTERMEDIATE Intermediate     TRIMETH/SULFA <=20 SENSITIVE Sensitive     AMPICILLIN/SULBACTAM 4 SENSITIVE Sensitive     PIP/TAZO <=4 SENSITIVE Sensitive     * >=100,000 COLONIES/mL KLEBSIELLA PNEUMONIAE     Radiology Studies: No results found.  Scheduled Meds: . folic acid  1 mg Oral Daily  . influenza vac split quadrivalent PF  0.5 mL Intramuscular Tomorrow-1000  . LORazepam  0-4 mg Intravenous Q12H  . multivitamin with minerals  1 tablet Oral Daily  . potassium chloride  40 mEq Oral BID  . thiamine  100 mg Oral Daily   Or  . thiamine  100 mg Intravenous Daily   Continuous Infusions: .  ceFAZolin  (ANCEF) IV 1 g (09/18/20 1224)     LOS: 3 days   Rickey Barbara, MD [facility] Pager On Amion  If 7PM-7AM, please contact night-coverage 09/18/2020, 5:03 PM

## 2020-09-18 NOTE — Evaluation (Signed)
Physical Therapy Evaluation Patient Details Name: Dylan Weaver MRN: [id number] DOB: 1966/06/18 Today's Date: 09/18/2020   History of Present Illness  54 yo male admitted with ETOH withdrawal, weakness. Hx of ETOH abuse.  Clinical Impression  On eval, pt required Min-Mod assist for mobility. He walked ~75 feet with a RW. Pt is very unsteady and at increased risk for falls when mobilizing. LOB multiple times during session. Pt reports he lives with his mother who is in a wheelchair (he helps care for her). At this time, do not feel pt is able to safely d/c home. Recommendation is currently for ST SNF however if his safety awareness and mobility improve significantly over next few days, he could potentially d/c home. Will continue to follow and progress activity as tolerated.     Follow Up Recommendations SNF ([facility] if safety awareness and mobility improve significantly)    Equipment Recommendations  Rolling walker with 5" wheels    Recommendations for Other Services       Precautions / Restrictions Precautions Precautions: Fall Restrictions Weight Bearing Restrictions: No      Mobility  Bed Mobility Overal bed mobility: Needs Assistance Bed Mobility: Supine to Sit     Supine to sit: Min guard;HOB elevated     General bed mobility comments: Min guard for safety. Cues for safety.    Transfers Overall transfer level: Needs assistance Equipment used: Rolling walker (2 wheeled);None Transfers: Sit to/from Stand Sit to Stand: Mod assist;From elevated surface         General transfer comment: Assist to power up, stabilize, control descent. Cues for posture, safety, technique, hand placement. Sit to stand x 1 without device-very wide BOS with bil LEs braced against bed to steady himself. Sit to stand x 1 with RW-still with wide BOS and LEs braced against bed. Very unsteady with increased risk for falls.  Ambulation/Gait Ambulation/Gait assistance: Min assist Gait  Distance (Feet): 75 Feet Assistive device: Rolling walker (2 wheeled) Gait Pattern/deviations: Step-through pattern;Decreased stride length;Trunk flexed     General Gait Details: Assist to stabilize pt and maneuver with RW. Cues for safety, posture, RW proximity, proper use of RW. Very unsteady with increased risk for falls.  Stairs            Wheelchair Mobility    Modified Rankin (Stroke Patients Only)       Balance Overall balance assessment: Needs assistance         Standing balance support: Bilateral upper extremity supported Standing balance-Leahy Scale: Poor                               Pertinent Vitals/Pain Pain Assessment: No/denies pain    Home Living Family/patient expects to be discharged to:: Private residence Living Arrangements: Parent (pt takes care of mother who is in a wheelchair)   Type of Home: House Home Access: Stairs to enter Entrance Stairs-Rails: None Entrance Stairs-Number of Steps: 2 Home Layout: Two level Home Equipment: None      Prior Function Level of Independence: Independent               Hand Dominance        Extremity/Trunk Assessment   Upper Extremity Assessment Upper Extremity Assessment: Generalized weakness    Lower Extremity Assessment Lower Extremity Assessment: Generalized weakness    Cervical / Trunk Assessment Cervical / Trunk Assessment: Normal  Communication   Communication: No difficulties  Cognition Arousal/Alertness: Awake/alert Behavior  During Therapy: WFL for tasks assessed/performed Overall Cognitive Status: Within Functional Limits for tasks assessed                                        General Comments      Exercises Total Joint Exercises Marching in Standing: Both;5 reps;Standing (pre-gait)   Assessment/Plan    PT Assessment Patient needs continued PT services  PT Problem List Decreased strength;Decreased mobility;Decreased activity  tolerance;Decreased balance;Decreased knowledge of use of DME;Decreased safety awareness       PT Treatment Interventions DME instruction;Gait training;Therapeutic activities;Therapeutic exercise;Patient/family education;Balance training;Functional mobility training    PT Goals (Current goals can be found in the Care Plan section)  Acute Rehab PT Goals Patient Stated Goal: home PT Goal Formulation: With patient Time For Goal Achievement: 10/02/20 Potential to Achieve Goals: Good    Frequency Min 3X/week   Barriers to discharge Decreased caregiver support      Co-evaluation               AM-PAC PT "6 Clicks" Mobility  Outcome Measure Help needed turning from your back to your side while in a flat bed without using bedrails?: A Little Help needed moving from lying on your back to sitting on the side of a flat bed without using bedrails?: A Little Help needed moving to and from a bed to a chair (including a wheelchair)?: A Little Help needed standing up from a chair using your arms (e.g., wheelchair or bedside chair)?: A Lot Help needed to walk in hospital room?: A Little Help needed climbing 3-5 steps with a railing? : A Lot 6 Click Score: 16    End of Session Equipment Utilized During Treatment: Gait belt Activity Tolerance: Patient tolerated treatment well Patient left: in chair;with call bell/phone within reach;with chair alarm set   PT Visit Diagnosis: Muscle weakness (generalized) (M62.81);Unsteadiness on feet (R26.81)    Time: 6967-8938 PT Time Calculation (min) (ACUTE ONLY): 18 min   Charges:   PT Evaluation $PT Eval Moderate Complexity: 1 Mod             Faye Ramsay, PT Acute Rehabilitation  Office: [phone] Pager: [phone]

## 2020-09-19 LAB — COMPREHENSIVE METABOLIC PANEL
ALT: 53 U/L — ABNORMAL HIGH (ref 0–44)
AST: 140 U/L — ABNORMAL HIGH (ref 15–41)
Albumin: 2.7 g/dL — ABNORMAL LOW (ref 3.5–5.0)
Alkaline Phosphatase: 300 U/L — ABNORMAL HIGH (ref 38–126)
Anion gap: 10 (ref 5–15)
BUN: 5 mg/dL — ABNORMAL LOW (ref 6–20)
CO2: 20 mmol/L — ABNORMAL LOW (ref 22–32)
Calcium: 8.8 mg/dL — ABNORMAL LOW (ref 8.9–10.3)
Chloride: 99 mmol/L (ref 98–111)
Creatinine, Ser: 0.61 mg/dL (ref 0.61–1.24)
GFR, Estimated: >60 mL/min (ref >60)
Glucose, Bld: 106 mg/dL — ABNORMAL HIGH (ref 70–99)
Potassium: 3.5 mmol/L (ref 3.5–5.1)
Sodium: 129 mmol/L — ABNORMAL LOW (ref 135–145)
Total Bilirubin: 4.1 mg/dL — ABNORMAL HIGH (ref 0.3–1.2)
Total Protein: 7.9 g/dL (ref 6.5–8.1)

## 2020-09-19 LAB — MAGNESIUM: Magnesium: 1.6 mg/dL — ABNORMAL LOW (ref 1.7–2.4)

## 2020-09-19 MED ORDER — LORAZEPAM 2 MG/ML IJ SOLN
1.0000 mg | Freq: Four times a day (QID) | INTRAMUSCULAR | Status: DC | PRN
  Administered 2020-09-20: 02:00:00 1 mg via INTRAMUSCULAR
  Filled 2020-09-19 (×2): qty 1

## 2020-09-19 MED ORDER — MAGNESIUM SULFATE 4 GM/100ML IV SOLN
4.0000 g | Freq: Once | INTRAVENOUS | Status: AC
  Administered 2020-09-19: 11:00:00 4 g via INTRAVENOUS
  Filled 2020-09-19: qty 100

## 2020-09-19 MED ORDER — LORAZEPAM 2 MG/ML IJ SOLN: 1.0000 mg | Freq: Four times a day (QID) | INTRAMUSCULAR | Status: DC | PRN

## 2020-09-19 MED ORDER — NICOTINE 21 MG/24HR TD PT24
21.0000 mg | MEDICATED_PATCH | Freq: Every day | TRANSDERMAL | Status: DC
  Administered 2020-09-19 – 2020-09-23 (×6): 21 mg via TRANSDERMAL
  Filled 2020-09-19 (×6): qty 1

## 2020-09-19 MED ORDER — POTASSIUM CHLORIDE CRYS ER 20 MEQ PO TBCR
60.0000 meq | EXTENDED_RELEASE_TABLET | Freq: Once | ORAL | Status: AC
  Administered 2020-09-19: 11:00:00 60 meq via ORAL
  Filled 2020-09-19: qty 3

## 2020-09-19 MED ORDER — CEPHALEXIN 500 MG PO CAPS
500.0000 mg | ORAL_CAPSULE | Freq: Four times a day (QID) | ORAL | Status: DC
  Administered 2020-09-19 – 2020-09-20 (×8): 500 mg via ORAL
  Filled 2020-09-19 (×8): qty 1

## 2020-09-19 MED ORDER — ALBUMIN HUMAN 25 % IV SOLN
25.0000 g | Freq: Once | INTRAVENOUS | Status: AC
  Administered 2020-09-19: 09:00:00 25 g via INTRAVENOUS
  Filled 2020-09-19: qty 100

## 2020-09-19 NOTE — Progress Notes (Signed)
  The pt currently does not have IV access, he states that he  wants to take a lift home to check on his mother, then come back to the hospital. At this present time he is refusing to have his IV restarted. I will continue monitor, and educate the Pt on the importance of restarting his IV.

## 2020-09-19 NOTE — NC FL2 (Signed)
[place] MEDICAID FL2 LEVEL OF CARE SCREENING TOOL     IDENTIFICATION  Patient Name: Dylan Weaver Birthdate: [birth year] Sex: male Admission Date (Current Location): 09/14/2020  [place] and [place] Number:  Chiropodist and Address:  [facility], [address], [place], [place] [address]      Provider Number:    Attending Physician Name and Address:  Jerald Kief, MD  Relative Name and Phone Number:       Current Level of Care: Hospital Recommended Level of Care: Skilled Nursing Facility Prior Approval Number:    Date Approved/Denied:   PASRR Number: [id number]  Discharge Plan:      Current Diagnoses: Patient Active Problem List   Diagnosis Date Noted  . Malnutrition of moderate degree 09/16/2020  . Alcohol withdrawal (HCC) 09/14/2020  . Alcoholic hepatitis 09/14/2020  . Thrombocytopenia (HCC) 09/14/2020  . Normochromic normocytic anemia 09/14/2020  . Alcohol use disorder, severe, dependence (HCC) 06/19/2020  . Alcohol abuse with intoxication (HCC) 06/19/2020    Orientation RESPIRATION BLADDER Height & Weight     Self  Normal Continent Weight: 176 lb 5.9 oz (80 kg) Height:  6' (182.9 cm)  BEHAVIORAL SYMPTOMS/MOOD NEUROLOGICAL BOWEL NUTRITION STATUS      Incontinent Diet (regular diet)  AMBULATORY STATUS COMMUNICATION OF NEEDS Skin   Extensive Assist Verbally Normal                       Personal Care Assistance Level of Assistance  Dressing,Feeding Bathing Assistance: Maximum assistance Feeding assistance: Independent Dressing Assistance: Maximum assistance     Functional Limitations Info  Sight,Speech Sight Info: Adequate Hearing Info: Adequate Speech Info: Adequate    SPECIAL CARE FACTORS FREQUENCY  PT (By licensed PT),OT (By licensed OT)     PT Frequency: 5x OT Frequency: 5x            Contractures Contractures Info: Not present    Additional Factors Info  Code  Status,Allergies Code Status Info: full code Allergies Info: no known allergies           Current Medications (09/19/2020):  This is the current hospital active medication list Current Facility-Administered Medications  Medication Dose Route Frequency Provider Last Rate Last Admin  . acetaminophen (TYLENOL) tablet 325 mg  325 mg Oral Q6H PRN Jerald Kief, MD   325 mg at 09/18/20 2152  . cephALEXin (KEFLEX) capsule 500 mg  500 mg Oral QID Jerald Kief, MD   500 mg at 09/19/20 1120  . folic acid (FOLVITE) tablet 1 mg  1 mg Oral Daily Eduard Clos, MD   1 mg at 09/19/20 0951  . influenza vac split quadrivalent PF (FLUARIX) injection 0.5 mL  0.5 mL Intramuscular Tomorrow-1000 Eduard Clos, MD      . LORazepam (ATIVAN) injection 1 mg  1 mg Intramuscular Q6H PRN Blount, Andi Devon T, NP      . magnesium sulfate IVPB 4 g 100 mL  4 g Intravenous Once Jerald Kief, MD 50 mL/hr at 09/19/20 1056 4 g at 09/19/20 1056  . multivitamin with minerals tablet 1 tablet  1 tablet Oral Daily Eduard Clos, MD   1 tablet at 09/19/20 0951  . nicotine (NICODERM CQ - dosed in mg/24 hours) patch 21 mg  21 mg Transdermal Daily Blount, Andi Devon T, NP   21 mg at 09/19/20 0951  . thiamine tablet 100 mg  100 mg Oral Daily Eduard Clos, MD  100 mg at 09/19/20 0951   Or  . thiamine (B-1) injection 100 mg  100 mg Intravenous Daily Eduard Clos, MD         Discharge Medications: Please see discharge summary for a list of discharge medications.  Relevant Imaging Results:  Relevant Lab Results:   Additional Information ssn:[id number]  Reuel Boom Vyron Fronczak, LCSW

## 2020-09-19 NOTE — Progress Notes (Signed)
PROGRESS NOTE    Dylan Weaver  GEX:[id number] DOB: 05/29/1966 DOA: 09/14/2020 PCP: Patient, No Pcp Per    Brief Narrative:  54 y.o. male with history of alcohol abuse patient states that over the last couple months he has been drinking more than usual after his divorce.  Denies any suicidal ideation denies taking any medications physically denies taking Tylenol states he has been feeling weak last couple of days last drink was more than 24 hours and has been having difficulty to walk because of the weakness.  Denies losing consciousness denies any chest pain shortness of breath has not been eating well.  Patient lives with his mother.  Ambulance was called and patient was on the floor in an unkempt situation.  ED Course: In the ER patient is mildly tachycardic labs show elevated LFTs sodium of 129 thrombocytopenia and mild normocytic normochromic anemia with mild leukocytosis UA concerning for UTI CT head was unremarkable.  X-ray chest and pelvis was unremarkable.  EKG shows sinus tachycardia.  Patient on exam is tremulous tachycardic and weak.  Patient was started on CIWA protocol admitted for alcohol withdrawal generalized weakness likely from dehydration and deconditioning  Assessment & Plan:   Principal Problem:   Alcohol withdrawal (HCC) Active Problems:   Alcoholic hepatitis   Thrombocytopenia (HCC)   Normochromic normocytic anemia   Malnutrition of moderate degree  1. Alcohol withdrawal  1. Patient was continued CIWA protocol 2. Mentation now seems much improved 3. Repeat cmp in AM 2. Generalized weakness with difficulty walking  1. Suspect could be from deconditioning.   2. CT cervical spine unremarkable, reviewed 3. PT evaluated and recommends SNF. Have consulted TOC 3. Klebsiella UTI present on admit   1. Urine culture notable for multi-drug sensitive klebsiella, resistant to ampicillin and nitrofurantoin 2. Pt had beencontinued on rocephin for now  3. Remains  hemodynamically stable 4. Elevated LFTs likely from alcohol hepatitis.   1. LFT's overall stable 2. Recommend continued ETOH cessation 3. Cont to follow CMP 5. Normocytic normochromic anemia no old labs to compare.   1. Iron levels unremarkable 2. B12 levels within normal limits 3. Folate noted to be low at 4.3. Will continue with folic acid replacement 6. Thrombocytopenia likely from alcoholism 1. Plts now trending back up, most recent 112 2. recheck cbc in AM 7. Hyponatremia   1. Suspect secondary to ETOH abuse as well as dehydration 2. IVF now on hold, sodium remain stable at 129 3. Unclear baseline sodium. Will request records from [place] to confirm baseline sodium levels 4. Repeat bmet in AM 8. Hypokalemia 1. replaced 9. Hypomagnesemia 1. replaced   DVT prophylaxis: SCD's Code Status: Full Family Communication: Pt in room, family not at bedside  Status is: Observation  The patient will require care spanning > 2 midnights and should be moved to inpatient because: Ongoing diagnostic testing needed not appropriate for outpatient work up, IV treatments appropriate due to intensity of illness or inability to take PO and Inpatient level of care appropriate due to severity of illness  Dispo: The patient is from: Home              Anticipated d/c is to: SNF              Anticipated d/c date is: 2 days              Patient currently is not medically stable to d/c.  Consultants:     Procedures:     Antimicrobials: Anti-infectives (  From admission, onward)   Start     Dose/Rate Route Frequency Ordered Stop   09/19/20 1100  cephALEXin (KEFLEX) capsule 500 mg        500 mg Oral 4 times daily 09/19/20 1008     09/17/20 2000  cephALEXin (KEFLEX) capsule 500 mg  Status:  Discontinued        500 mg Oral 4 times daily 09/17/20 1011 09/17/20 1013   09/17/20 2000  ceFAZolin (ANCEF) IVPB 1 g/50 mL premix  Status:  Discontinued        1 g 100 mL/hr over 30 Minutes Intravenous Every 8  hours 09/17/20 1014 09/19/20 1008   09/15/20 2000  cefTRIAXone (ROCEPHIN) 1 g in sodium chloride 0.9 % 100 mL IVPB  Status:  Discontinued        1 g 200 mL/hr over 30 Minutes Intravenous Every 24 hours 09/14/20 2205 09/17/20 1011   09/14/20 2015  cefTRIAXone (ROCEPHIN) 1 g in sodium chloride 0.9 % 100 mL IVPB        1 g 200 mL/hr over 30 Minutes Intravenous  Once 09/14/20 2007 09/14/20 2210      Subjective: Reports feeling better  Objective: Vitals:   09/18/20 1242 09/18/20 2157 09/19/20 0556 09/19/20 1240  BP: 121/81 (!) 124/92 129/87 127/86  Pulse: 84 82 77 87  Resp: 20 20 20 18   Temp: 97.8 F (36.6 C) 97.8 F (36.6 C) 97.9 F (36.6 C) (!) 97.3 F (36.3 C)  TempSrc: Oral Oral    SpO2: 97% 99% 98% 98%  Weight:      Height:        Intake/Output Summary (Last 24 hours) at 09/19/2020 1520 Last data filed at 09/19/2020 1121 Gross per 24 hour  Intake 760 ml  Output 650 ml  Net 110 ml   Filed Weights   09/16/20 1105  Weight: 80 kg    Examination: General exam: Conversant, in no acute distress Respiratory system: normal chest rise, clear, no audible wheezing Cardiovascular system: regular rhythm, s1-s2 Gastrointestinal system: Nondistended, nontender, pos BS Central nervous system: No seizures, no tremors Extremities: No cyanosis, no joint deformities Skin: No rashes, no pallor Psychiatry: Affect normal // no auditory hallucinations   Data Reviewed: I have personally reviewed following labs and imaging studies  CBC: Recent Labs  Lab 09/14/20 1607 09/15/20 0458 09/16/20 0814 09/17/20 0601 09/18/20 0544  WBC 11.4* 12.5* 10.4 4.2 4.3  NEUTROABS 10.4* 11.2*  --   --   --   HGB 12.7* 12.0* 11.6* 10.7* 10.1*  HCT 37.7* 36.1* 35.0* 32.1* 31.4*  MCV 87.9 90.0 90.9 89.9 92.1  PLT 56* 47* 53* 66* 112*   Basic Metabolic Panel: Recent Labs  Lab 09/15/20 0458 09/16/20 0814 09/17/20 0601 09/18/20 0544 09/19/20 0548  NA 130* 129* 125* 128* 129*  K 3.2* 3.0*  2.8* 3.3* 3.5  CL 94* 94* 94* 98 99  CO2 22 20* 19* 18* 20*  GLUCOSE 106* 80 88 110* 106*  BUN 6 5* <5* <5* 5*  CREATININE 0.81 0.68 0.55* 0.57* 0.61  CALCIUM 8.6* 8.9 8.3* 8.5* 8.8*  MG  --  1.4* 1.3* 1.8 1.6*  PHOS  --   --  2.4*  --   --    GFR: Estimated Creatinine Clearance: 115.9 mL/min (by C-G formula based on SCr of 0.61 mg/dL). Liver Function Tests: Recent Labs  Lab 09/15/20 0458 09/16/20 0814 09/17/20 0601 09/18/20 0544 09/19/20 0548  AST 173* 127* 148* 150* 140*  ALT 51* 43  44 48* 53*  ALKPHOS 342* 288* 283* 270* 300*  BILITOT 3.5* 4.5* 5.3* 4.2* 4.1*  PROT 8.7* 8.3* 7.9 7.8 7.9  ALBUMIN 3.0* 2.9* 2.7* 2.7* 2.7*   No results for input(s): LIPASE, AMYLASE in the last 168 hours. No results for input(s): AMMONIA in the last 168 hours. Coagulation Profile: Recent Labs  Lab 09/15/20 0458  INR 1.0   Cardiac Enzymes: Recent Labs  Lab 09/14/20 1607  CKTOTAL 130   BNP (last 3 results) No results for input(s): PROBNP in the last 8760 hours. HbA1C: No results for input(s): HGBA1C in the last 72 hours. CBG: No results for input(s): GLUCAP in the last 168 hours. Lipid Profile: No results for input(s): CHOL, HDL, LDLCALC, TRIG, CHOLHDL, LDLDIRECT in the last 72 hours. Thyroid Function Tests: No results for input(s): TSH, T4TOTAL, FREET4, T3FREE, THYROIDAB in the last 72 hours. Anemia Panel: No results for input(s): VITAMINB12, FOLATE, FERRITIN, TIBC, IRON, RETICCTPCT in the last 72 hours. Sepsis Labs: No results for input(s): PROCALCITON, LATICACIDVEN in the last 168 hours.  Recent Results (from the past 240 hour(s))  Resp Panel by RT-PCR (Flu A&B, Covid) Nasopharyngeal Swab     Status: None   Collection Time: 09/14/20  4:07 PM   Specimen: Nasopharyngeal Swab; Nasopharyngeal(NP) swabs in vial transport medium  Result Value Ref Range Status   SARS Coronavirus 2 by RT PCR NEGATIVE NEGATIVE Final    Comment: (NOTE) SARS-CoV-2 target nucleic acids are NOT  DETECTED.  The SARS-CoV-2 RNA is generally detectable in upper respiratory specimens during the acute phase of infection. The lowest concentration of SARS-CoV-2 viral copies this assay can detect is 138 copies/mL. A negative result does not preclude SARS-Cov-2 infection and should not be used as the sole basis for treatment or other patient management decisions. A negative result may occur with  improper specimen collection/handling, submission of specimen other than nasopharyngeal swab, presence of viral mutation(s) within the areas targeted by this assay, and inadequate number of viral copies(<138 copies/mL). A negative result must be combined with clinical observations, patient history, and epidemiological information. The expected result is Negative.  Fact Sheet for Patients:  BloggerCourse.comhttps://www.fda.gov/media/152166/download  Fact Sheet for Healthcare Providers:  SeriousBroker.ithttps://www.fda.gov/media/152162/download  This test is no t yet approved or cleared by the [place] FDA and  has been authorized for detection and/or diagnosis of SARS-CoV-2 by FDA under an Emergency Use Authorization (EUA). This EUA will remain  in effect (meaning this test can be used) for the duration of the COVID-19 declaration under Section 564(b)(1) of the Act, 21 U.S.C.section 360bbb-3(b)(1), unless the authorization is terminated  or revoked sooner.       Influenza A by PCR NEGATIVE NEGATIVE Final   Influenza B by PCR NEGATIVE NEGATIVE Final    Comment: (NOTE) The Xpert Xpress SARS-CoV-2/FLU/RSV plus assay is intended as an aid in the diagnosis of influenza from Nasopharyngeal swab specimens and should not be used as a sole basis for treatment. Nasal washings and aspirates are unacceptable for Xpert Xpress SARS-CoV-2/FLU/RSV testing.  Fact Sheet for Patients: BloggerCourse.comhttps://www.fda.gov/media/152166/download  Fact Sheet for Healthcare Providers: SeriousBroker.ithttps://www.fda.gov/media/152162/download  This test is not yet  approved or cleared by the [place] FDA and has been authorized for detection and/or diagnosis of SARS-CoV-2 by FDA under an Emergency Use Authorization (EUA). This EUA will remain in effect (meaning this test can be used) for the duration of the COVID-19 declaration under Section 564(b)(1) of the Act, 21 U.S.C. section 360bbb-3(b)(1), unless the authorization is terminated or revoked.  Performed at [facility], [address] [place], [place] [address]   Urine Culture     Status: Abnormal   Collection Time: 09/14/20  6:22 PM   Specimen: Urine, Random  Result Value Ref Range Status   Specimen Description   Final    URINE, RANDOM Performed at [facility], [address], [place], [place] [address]    Special Requests   Final    NONE Performed at [facility], [address] [place], [place] [address]    Culture >=100,000 COLONIES/mL KLEBSIELLA PNEUMONIAE (A)  Final   Report Status 09/16/2020 FINAL  Final   Organism ID, Bacteria KLEBSIELLA PNEUMONIAE (A)  Final      Susceptibility   Klebsiella pneumoniae - MIC*    AMPICILLIN RESISTANT Resistant     CEFAZOLIN <=4 SENSITIVE Sensitive     CEFEPIME <=0.12 SENSITIVE Sensitive     CEFTRIAXONE <=0.25 SENSITIVE Sensitive     CIPROFLOXACIN <=0.25 SENSITIVE Sensitive     GENTAMICIN <=1 SENSITIVE Sensitive     IMIPENEM <=0.25 SENSITIVE Sensitive     NITROFURANTOIN 64 INTERMEDIATE Intermediate     TRIMETH/SULFA <=20 SENSITIVE Sensitive     AMPICILLIN/SULBACTAM 4 SENSITIVE Sensitive     PIP/TAZO <=4 SENSITIVE Sensitive     * >=100,000 COLONIES/mL KLEBSIELLA PNEUMONIAE     Radiology Studies: No results found.  Scheduled Meds: . cephALEXin  500 mg Oral QID  . folic acid  1 mg Oral Daily  . influenza vac split quadrivalent PF  0.5 mL Intramuscular Tomorrow-1000  . multivitamin with minerals  1 tablet Oral Daily  . nicotine  21 mg Transdermal Daily  . thiamine  100 mg  Oral Daily   Or  . thiamine  100 mg Intravenous Daily   Continuous Infusions:    LOS: 4 days   Rickey Barbara, MD [facility] Pager On Amion  If 7PM-7AM, please contact night-coverage 09/19/2020, 3:20 PM

## 2020-09-19 NOTE — Progress Notes (Signed)
The pt has been restless all night, he continues to get oob with out assistance. He is very unsteady on his feet. Floor mats and bed alarm in place for safety, He states that he wants to  Leave AMA states " I have been here long  enough"  and he wants to go home. The pt proceeded to pull his IV out.  Page Gustavo Lah, no orders given at this time.

## 2020-09-20 LAB — COMPREHENSIVE METABOLIC PANEL
ALT: 54 U/L — ABNORMAL HIGH (ref 0–44)
AST: 131 U/L — ABNORMAL HIGH (ref 15–41)
Albumin: 3.2 g/dL — ABNORMAL LOW (ref 3.5–5.0)
Alkaline Phosphatase: 310 U/L — ABNORMAL HIGH (ref 38–126)
Anion gap: 10 (ref 5–15)
BUN: 5 mg/dL — ABNORMAL LOW (ref 6–20)
CO2: 21 mmol/L — ABNORMAL LOW (ref 22–32)
Calcium: 8.8 mg/dL — ABNORMAL LOW (ref 8.9–10.3)
Chloride: 96 mmol/L — ABNORMAL LOW (ref 98–111)
Creatinine, Ser: 0.67 mg/dL (ref 0.61–1.24)
GFR, Estimated: >60 mL/min (ref >60)
Glucose, Bld: 99 mg/dL (ref 70–99)
Potassium: 3.8 mmol/L (ref 3.5–5.1)
Sodium: 127 mmol/L — ABNORMAL LOW (ref 135–145)
Total Bilirubin: 4.5 mg/dL — ABNORMAL HIGH (ref 0.3–1.2)
Total Protein: 8.5 g/dL — ABNORMAL HIGH (ref 6.5–8.1)

## 2020-09-20 LAB — MAGNESIUM: Magnesium: 1.9 mg/dL (ref 1.7–2.4)

## 2020-09-20 LAB — CBC
HCT: 35.4 % — ABNORMAL LOW (ref 39.0–52.0)
Hemoglobin: 11.5 g/dL — ABNORMAL LOW (ref 13.0–17.0)
MCH: 29.9 pg (ref 26.0–34.0)
MCHC: 32.5 g/dL (ref 30.0–36.0)
MCV: 92.2 fL (ref 80.0–100.0)
Platelets: 201 10*3/uL (ref 150–400)
RBC: 3.84 MIL/uL — ABNORMAL LOW (ref 4.22–5.81)
RDW: 18.9 % — ABNORMAL HIGH (ref 11.5–15.5)
WBC: 5.6 10*3/uL (ref 4.0–10.5)
nRBC: 0 % (ref 0.0–0.2)

## 2020-09-20 LAB — AMMONIA: Ammonia: 34 umol/L (ref 9–35)

## 2020-09-20 MED ORDER — LORAZEPAM 2 MG/ML IJ SOLN
1.0000 mg | Freq: Four times a day (QID) | INTRAMUSCULAR | Status: AC | PRN
  Administered 2020-09-20: 23:00:00 1 mg via INTRAVENOUS
  Filled 2020-09-20: qty 1

## 2020-09-20 MED ORDER — HALOPERIDOL LACTATE 5 MG/ML IJ SOLN
2.0000 mg | Freq: Four times a day (QID) | INTRAMUSCULAR | Status: DC | PRN
  Administered 2020-09-20 – 2020-09-21 (×4): 2 mg via INTRAVENOUS
  Filled 2020-09-20 (×4): qty 1

## 2020-09-20 NOTE — Progress Notes (Signed)
PT Cancellation Note  Patient Details Name: Osiah Haring MRN: [id number] DOB: 05-16-1966   Cancelled Treatment:    Reason Eval/Treat Not Completed: Spoke with RN who reported pt received haldol this am. Will hold PT for now and check back another time. Thanks.    Faye Ramsay, PT Acute Rehabilitation  Office: [phone] Pager: [phone]

## 2020-09-20 NOTE — Progress Notes (Signed)
Called to bedside by RN and [facility] just before 4am.  Pt wants to leave AMA.  He had defecated in the hallway prior to stating he would leave AMA and now states he can't stay because of "that debacle."    Discussed his current debility, need to finish medication, ABX, weakness.  He has no clothes or shoes, temperature outside is below freezing and he has no way to get home.  He wanted to have his meds mailed to him and I explained the process of leaving AMA, including that medications would not be provided.  He agreed to stay as long as he "doesn't have to go back to that church choir room with all the pews".  He states he will allow labs to help [place] determine his plan of care and would like to speak to his attending as early as possible this morning to find out when he can go home.

## 2020-09-20 NOTE — Progress Notes (Signed)
PROGRESS NOTE    Jann Ra  JME:[id number] DOB: 1966/02/27 DOA: 09/14/2020 PCP: Patient, No Pcp Per    Brief Narrative:  54 y.o. male with history of alcohol abuse patient states that over the last couple months he has been drinking more than usual after his divorce.  Denies any suicidal ideation denies taking any medications physically denies taking Tylenol states he has been feeling weak last couple of days last drink was more than 24 hours and has been having difficulty to walk because of the weakness.  Denies losing consciousness denies any chest pain shortness of breath has not been eating well.  Patient lives with his mother.  Ambulance was called and patient was on the floor in an unkempt situation.  ED Course: In the ER patient is mildly tachycardic labs show elevated LFTs sodium of 129 thrombocytopenia and mild normocytic normochromic anemia with mild leukocytosis UA concerning for UTI CT head was unremarkable.  X-ray chest and pelvis was unremarkable.  EKG shows sinus tachycardia.  Patient on exam is tremulous tachycardic and weak.  Patient was started on CIWA protocol admitted for alcohol withdrawal generalized weakness likely from dehydration and deconditioning  Assessment & Plan:   Principal Problem:   Alcohol withdrawal (HCC) Active Problems:   Alcoholic hepatitis   Thrombocytopenia (HCC)   Normochromic normocytic anemia   Malnutrition of moderate degree  1. Alcohol withdrawal  1. Patient had been continued on CIWA protocol 2. Mentation now seems much improved 3. Repeat cmp in AM 2. Generalized weakness with difficulty walking  1. Suspect could be from deconditioning.   2. CT cervical spine unremarkable, reviewed 3. PT evaluated and recommends SNF. TOC was consulted 3. Klebsiella UTI present on admit   1. Urine culture notable for multi-drug sensitive klebsiella, resistant to ampicillin and nitrofurantoin 2. Pt had beencontinued on rocephin for now   3. Remains hemodynamically stable 4. Elevated LFTs likely from alcohol hepatitis.   1. LFT's overall stable 2. Recommend continued ETOH cessation 3. Repeat cmp in AM 5. Normocytic normochromic anemia no old labs to compare.   1. Iron levels unremarkable 2. B12 levels within normal limits 3. Folate noted to be low at 4.3. Will continue with folic acid replacement 6. Thrombocytopenia likely from alcoholism 1. Plts now trending back up, most recent 112 2. Repeat cbc in AM 7. Hyponatremia   1. Suspect secondary to ETOH abuse as well as dehydration initially 2. Unclear baseline sodium. Will request records from [place] to confirm baseline sodium levels 3. Urine and sodium osm both low. Will fluid restrict 4. Repeat bmet in AM 8. Hypokalemia 1. replaced 9. Hypomagnesemia 1. Replaced 10. Toxic metabolic encephalopathy 1. Suspect secondary to hyponatremia 2. See RN documentation. Pt became confused overnight and again this AM 3. Pt currently sedated with haldol   DVT prophylaxis: SCD's Code Status: Full Family Communication: Pt in room, family not at bedside  Status is: Inpateint  The patient will require care spanning > 2 midnights and should be continued inpatient: Altered mental status, IV treatments appropriate due to intensity of illness or inability to take PO and Inpatient level of care appropriate due to severity of illness  Dispo: The patient is from: Home              Anticipated d/c is to: SNF              Anticipated d/c date is: > 3 days  Patient currently is not medically stable to d/c.  Consultants:     Procedures:     Antimicrobials: Anti-infectives (From admission, onward)   Start     Dose/Rate Route Frequency Ordered Stop   09/19/20 1100  cephALEXin (KEFLEX) capsule 500 mg        500 mg Oral 4 times daily 09/19/20 1008     09/17/20 2000  cephALEXin (KEFLEX) capsule 500 mg  Status:  Discontinued        500 mg Oral 4 times daily 09/17/20 1011  09/17/20 1013   09/17/20 2000  ceFAZolin (ANCEF) IVPB 1 g/50 mL premix  Status:  Discontinued        1 g 100 mL/hr over 30 Minutes Intravenous Every 8 hours 09/17/20 1014 09/19/20 1008   09/15/20 2000  cefTRIAXone (ROCEPHIN) 1 g in sodium chloride 0.9 % 100 mL IVPB  Status:  Discontinued        1 g 200 mL/hr over 30 Minutes Intravenous Every 24 hours 09/14/20 2205 09/17/20 1011   09/14/20 2015  cefTRIAXone (ROCEPHIN) 1 g in sodium chloride 0.9 % 100 mL IVPB        1 g 200 mL/hr over 30 Minutes Intravenous  Once 09/14/20 2007 09/14/20 2210      Subjective: Sedated this AM.   Objective: Vitals:   09/19/20 1240 09/19/20 2224 09/20/20 0538 09/20/20 1215  BP: 127/86 120/84 124/85 107/71  Pulse: 87 78 86 90  Resp: 18 18 16 20   Temp: (!) 97.3 F (36.3 C) 98 F (36.7 C) 97.6 F (36.4 C) 98.5 F (36.9 C)  TempSrc:  Oral Oral Oral  SpO2: 98% 98% 100% 98%  Weight:      Height:        Intake/Output Summary (Last 24 hours) at 09/20/2020 1505 Last data filed at 09/20/2020 1330 Gross per 24 hour  Intake 480 ml  Output 2000 ml  Net -1520 ml   Filed Weights   09/16/20 1105  Weight: 80 kg    Examination: General exam: Asleep, laying in bed, in nad Respiratory system: Normal respiratory effort, no wheezing Cardiovascular system: regular rate, s1, s2 Gastrointestinal system: Soft, nondistended, positive BS Central nervous system: CN2-12 grossly intact, strength intact Extremities: Perfused, no clubbing Skin: Normal skin turgor, no notable skin lesions seen Psychiatry: Unable to assess as pt is sedated   Data Reviewed: I have personally reviewed following labs and imaging studies  CBC: Recent Labs  Lab 09/14/20 1607 09/15/20 0458 09/16/20 0814 09/17/20 0601 09/18/20 0544 09/20/20 0456  WBC 11.4* 12.5* 10.4 4.2 4.3 5.6  NEUTROABS 10.4* 11.2*  --   --   --   --   HGB 12.7* 12.0* 11.6* 10.7* 10.1* 11.5*  HCT 37.7* 36.1* 35.0* 32.1* 31.4* 35.4*  MCV 87.9 90.0 90.9 89.9  92.1 92.2  PLT 56* 47* 53* 66* 112* 201   Basic Metabolic Panel: Recent Labs  Lab 09/16/20 0814 09/17/20 0601 09/18/20 0544 09/19/20 0548 09/20/20 0456  NA 129* 125* 128* 129* 127*  K 3.0* 2.8* 3.3* 3.5 3.8  CL 94* 94* 98 99 96*  CO2 20* 19* 18* 20* 21*  GLUCOSE 80 88 110* 106* 99  BUN 5* <5* <5* 5* 5*  CREATININE 0.68 0.55* 0.57* 0.61 0.67  CALCIUM 8.9 8.3* 8.5* 8.8* 8.8*  MG 1.4* 1.3* 1.8 1.6* 1.9  PHOS  --  2.4*  --   --   --    GFR: Estimated Creatinine Clearance: 115.9 mL/min (by C-G formula based  on SCr of 0.67 mg/dL). Liver Function Tests: Recent Labs  Lab 09/16/20 0814 09/17/20 0601 09/18/20 0544 09/19/20 0548 09/20/20 0456  AST 127* 148* 150* 140* 131*  ALT 43 44 48* 53* 54*  ALKPHOS 288* 283* 270* 300* 310*  BILITOT 4.5* 5.3* 4.2* 4.1* 4.5*  PROT 8.3* 7.9 7.8 7.9 8.5*  ALBUMIN 2.9* 2.7* 2.7* 2.7* 3.2*   No results for input(s): LIPASE, AMYLASE in the last 168 hours. Recent Labs  Lab 09/20/20 0824  AMMONIA 34   Coagulation Profile: Recent Labs  Lab 09/15/20 0458  INR 1.0   Cardiac Enzymes: Recent Labs  Lab 09/14/20 1607  CKTOTAL 130   BNP (last 3 results) No results for input(s): PROBNP in the last 8760 hours. HbA1C: No results for input(s): HGBA1C in the last 72 hours. CBG: No results for input(s): GLUCAP in the last 168 hours. Lipid Profile: No results for input(s): CHOL, HDL, LDLCALC, TRIG, CHOLHDL, LDLDIRECT in the last 72 hours. Thyroid Function Tests: No results for input(s): TSH, T4TOTAL, FREET4, T3FREE, THYROIDAB in the last 72 hours. Anemia Panel: No results for input(s): VITAMINB12, FOLATE, FERRITIN, TIBC, IRON, RETICCTPCT in the last 72 hours. Sepsis Labs: No results for input(s): PROCALCITON, LATICACIDVEN in the last 168 hours.  Recent Results (from the past 240 hour(s))  Resp Panel by RT-PCR (Flu A&B, Covid) Nasopharyngeal Swab     Status: None   Collection Time: 09/14/20  4:07 PM   Specimen: Nasopharyngeal Swab;  Nasopharyngeal(NP) swabs in vial transport medium  Result Value Ref Range Status   SARS Coronavirus 2 by RT PCR NEGATIVE NEGATIVE Final    Comment: (NOTE) SARS-CoV-2 target nucleic acids are NOT DETECTED.  The SARS-CoV-2 RNA is generally detectable in upper respiratory specimens during the acute phase of infection. The lowest concentration of SARS-CoV-2 viral copies this assay can detect is 138 copies/mL. A negative result does not preclude SARS-Cov-2 infection and should not be used as the sole basis for treatment or other patient management decisions. A negative result may occur with  improper specimen collection/handling, submission of specimen other than nasopharyngeal swab, presence of viral mutation(s) within the areas targeted by this assay, and inadequate number of viral copies(<138 copies/mL). A negative result must be combined with clinical observations, patient history, and epidemiological information. The expected result is Negative.  Fact Sheet for Patients:  BloggerCourse.com  Fact Sheet for Healthcare Providers:  SeriousBroker.it  This test is no t yet approved or cleared by the [place] FDA and  has been authorized for detection and/or diagnosis of SARS-CoV-2 by FDA under an Emergency Use Authorization (EUA). This EUA will remain  in effect (meaning this test can be used) for the duration of the COVID-19 declaration under Section 564(b)(1) of the Act, 21 U.S.C.section 360bbb-3(b)(1), unless the authorization is terminated  or revoked sooner.       Influenza A by PCR NEGATIVE NEGATIVE Final   Influenza B by PCR NEGATIVE NEGATIVE Final    Comment: (NOTE) The Xpert Xpress SARS-CoV-2/FLU/RSV plus assay is intended as an aid in the diagnosis of influenza from Nasopharyngeal swab specimens and should not be used as a sole basis for treatment. Nasal washings and aspirates are unacceptable for Xpert Xpress  SARS-CoV-2/FLU/RSV testing.  Fact Sheet for Patients: BloggerCourse.com  Fact Sheet for Healthcare Providers: SeriousBroker.it  This test is not yet approved or cleared by the [place] FDA and has been authorized for detection and/or diagnosis of SARS-CoV-2 by FDA under an Emergency Use Authorization (EUA). This EUA  will remain in effect (meaning this test can be used) for the duration of the COVID-19 declaration under Section 564(b)(1) of the Act, 21 U.S.C. section 360bbb-3(b)(1), unless the authorization is terminated or revoked.  Performed at [facility], [address] [place], [place] [address]   Urine Culture     Status: Abnormal   Collection Time: 09/14/20  6:22 PM   Specimen: Urine, Random  Result Value Ref Range Status   Specimen Description   Final    URINE, RANDOM Performed at [facility], [address] [place], [place] [address]    Special Requests   Final    NONE Performed at [facility], [address] [place], [place] [address]    Culture >=100,000 COLONIES/mL KLEBSIELLA PNEUMONIAE (A)  Final   Report Status 09/16/2020 FINAL  Final   Organism ID, Bacteria KLEBSIELLA PNEUMONIAE (A)  Final      Susceptibility   Klebsiella pneumoniae - MIC*    AMPICILLIN RESISTANT Resistant     CEFAZOLIN <=4 SENSITIVE Sensitive     CEFEPIME <=0.12 SENSITIVE Sensitive     CEFTRIAXONE <=0.25 SENSITIVE Sensitive     CIPROFLOXACIN <=0.25 SENSITIVE Sensitive     GENTAMICIN <=1 SENSITIVE Sensitive     IMIPENEM <=0.25 SENSITIVE Sensitive     NITROFURANTOIN 64 INTERMEDIATE Intermediate     TRIMETH/SULFA <=20 SENSITIVE Sensitive     AMPICILLIN/SULBACTAM 4 SENSITIVE Sensitive     PIP/TAZO <=4 SENSITIVE Sensitive     * >=100,000 COLONIES/mL KLEBSIELLA PNEUMONIAE     Radiology Studies: No results found.  Scheduled Meds: . cephALEXin  500 mg Oral QID  . folic  acid  1 mg Oral Daily  . influenza vac split quadrivalent PF  0.5 mL Intramuscular Tomorrow-1000  . multivitamin with minerals  1 tablet Oral Daily  . nicotine  21 mg Transdermal Daily  . thiamine  100 mg Oral Daily   Or  . thiamine  100 mg Intravenous Daily   Continuous Infusions:    LOS: 5 days   Rickey BarbaraStephen Chelesa Weingartner, MD [facility] Pager On Amion  If 7PM-7AM, please contact night-coverage 09/20/2020, 3:05 PM

## 2020-09-20 NOTE — Progress Notes (Signed)
Patient adamant about leaving AMA, not listening to education, refusing any further treatment. Patient will not allow telemetry to be put back on. Patient defecated in the floor of hallway.  Charge nurse notified, [facility] notified, on-call provider notified. Awaiting new orders

## 2020-09-20 NOTE — Progress Notes (Signed)
OT Cancellation Note  Patient Details Name: Dylan Weaver MRN: [id number] DOB: 1965-10-09   Cancelled Treatment:    Reason Eval/Treat Not Completed: Patient's level of consciousness:  Attempted x 2 this AM and pt was sleeping deeply. Spoke with RN who reported that pt received Haldol this morning due to agitation.  Will continue to hold and reattempt as able.   Theodoro Clock 09/20/2020, 1:15 PM

## 2020-09-21 LAB — COMPREHENSIVE METABOLIC PANEL
ALT: 59 U/L — ABNORMAL HIGH (ref 0–44)
AST: 123 U/L — ABNORMAL HIGH (ref 15–41)
Albumin: 2.9 g/dL — ABNORMAL LOW (ref 3.5–5.0)
Alkaline Phosphatase: 289 U/L — ABNORMAL HIGH (ref 38–126)
Anion gap: 9 (ref 5–15)
BUN: 8 mg/dL (ref 6–20)
CO2: 20 mmol/L — ABNORMAL LOW (ref 22–32)
Calcium: 8.9 mg/dL (ref 8.9–10.3)
Chloride: 100 mmol/L (ref 98–111)
Creatinine, Ser: 0.56 mg/dL — ABNORMAL LOW (ref 0.61–1.24)
GFR, Estimated: >60 mL/min (ref >60)
Glucose, Bld: 94 mg/dL (ref 70–99)
Potassium: 3.7 mmol/L (ref 3.5–5.1)
Sodium: 129 mmol/L — ABNORMAL LOW (ref 135–145)
Total Bilirubin: 3.5 mg/dL — ABNORMAL HIGH (ref 0.3–1.2)
Total Protein: 8.2 g/dL — ABNORMAL HIGH (ref 6.5–8.1)

## 2020-09-21 LAB — CBC
HCT: 34.3 % — ABNORMAL LOW (ref 39.0–52.0)
Hemoglobin: 11 g/dL — ABNORMAL LOW (ref 13.0–17.0)
MCH: 30.2 pg (ref 26.0–34.0)
MCHC: 32.1 g/dL (ref 30.0–36.0)
MCV: 94.2 fL (ref 80.0–100.0)
Platelets: 268 10*3/uL (ref 150–400)
RBC: 3.64 MIL/uL — ABNORMAL LOW (ref 4.22–5.81)
RDW: 18.8 % — ABNORMAL HIGH (ref 11.5–15.5)
WBC: 6.7 10*3/uL (ref 4.0–10.5)
nRBC: 0 % (ref 0.0–0.2)

## 2020-09-21 LAB — MAGNESIUM: Magnesium: 1.7 mg/dL (ref 1.7–2.4)

## 2020-09-21 MED ORDER — LORAZEPAM 2 MG/ML IJ SOLN
2.0000 mg | Freq: Once | INTRAMUSCULAR | Status: AC
  Administered 2020-09-22: 03:00:00 2 mg via INTRAVENOUS
  Filled 2020-09-21: qty 1

## 2020-09-21 NOTE — Progress Notes (Signed)
PROGRESS NOTE    Dylan Weaver  ZOX:[id number] DOB: [birth year] DOA: 09/14/2020 PCP: Patient, No Pcp Per    Brief Narrative:  54 y.o. male with history of alcohol abuse patient states that over the last couple months he has been drinking more than usual after his divorce.  Denies any suicidal ideation denies taking any medications physically denies taking Tylenol states he has been feeling weak last couple of days last drink was more than 24 hours and has been having difficulty to walk because of the weakness.  Denies losing consciousness denies any chest pain shortness of breath has not been eating well.  Patient lives with his mother.  Ambulance was called and patient was on the floor in an unkempt situation.  ED Course: In the ER patient is mildly tachycardic labs show elevated LFTs sodium of 129 thrombocytopenia and mild normocytic normochromic anemia with mild leukocytosis UA concerning for UTI CT head was unremarkable.  X-ray chest and pelvis was unremarkable.  EKG shows sinus tachycardia.  Patient on exam is tremulous tachycardic and weak.  Patient was started on CIWA protocol admitted for alcohol withdrawal generalized weakness likely from dehydration and deconditioning  Assessment & Plan:   Principal Problem:   Alcohol withdrawal (HCC) Active Problems:   Alcoholic hepatitis   Thrombocytopenia (HCC)   Normochromic normocytic anemia   Malnutrition of moderate degree  1. Alcohol withdrawal  1. Patient had been continued on CIWA protocol 2. Mentation now seems much improved 3. Cont to follow CMP 2. Generalized weakness with difficulty walking  1. Suspect could be from deconditioning.   2. CT cervical spine unremarkable, reviewed 3. PT evaluated and recommends SNF. TOC following 3. Klebsiella UTI present on admit   1. Urine culture notable for multi-drug sensitive klebsiella, resistant to ampicillin and nitrofurantoin 2. Completed course of rocephin 3. Remains  hemodynamically stable 4. Elevated LFTs likely from alcohol hepatitis.   1. LFT's overall stable 2. Recommend continued ETOH cessation 3. Recheck cmp in AM 5. Normocytic normochromic anemia no old labs to compare.   1. Iron levels unremarkable 2. B12 levels within normal limits 3. Folate noted to be low at 4.3. Will continue with folic acid replacement 6. Thrombocytopenia likely from alcoholism 1. Plts now trending back up, most recent 112 2. Repeat cbc in AM 7. Hyponatremia   1. Suspect secondary to ETOH abuse as well as dehydration initially 2. Unclear baseline sodium 3. Urine and sodium osm both noted to be low. Now on 1200cc fluid restriction 4. Na slightly improved but still <130 5. Repeat bmet in AM 8. Hypokalemia 1. replaced 9. Hypomagnesemia 1. Replaced 10. Acute Toxic metabolic encephalopathy 1. Perhaps made worse by hyponatremia 2. See RN documentation. Pt recently became confused and at times agitated 3. Sodium now slowly improving 4. Repeat bmet in AM   DVT prophylaxis: SCD's Code Status: Full Family Communication: Pt in room, family not at bedside  Status is: Inpateint  The patient will require care spanning > 2 midnights and should be continued inpatient: Altered mental status, IV treatments appropriate due to intensity of illness or inability to take PO and Inpatient level of care appropriate due to severity of illness  Dispo: The patient is from: Home              Anticipated d/c is to: SNF              Anticipated d/c date is: 2 days  Patient currently is not medically stable to d/c.  Consultants:     Procedures:     Antimicrobials: Anti-infectives (From admission, onward)   Start     Dose/Rate Route Frequency Ordered Stop   09/19/20 1100  cephALEXin (KEFLEX) capsule 500 mg  Status:  Discontinued        500 mg Oral 4 times daily 09/19/20 1008 09/21/20 1018   09/17/20 2000  cephALEXin (KEFLEX) capsule 500 mg  Status:  Discontinued         500 mg Oral 4 times daily 09/17/20 1011 09/17/20 1013   09/17/20 2000  ceFAZolin (ANCEF) IVPB 1 g/50 mL premix  Status:  Discontinued        1 g 100 mL/hr over 30 Minutes Intravenous Every 8 hours 09/17/20 1014 09/19/20 1008   09/15/20 2000  cefTRIAXone (ROCEPHIN) 1 g in sodium chloride 0.9 % 100 mL IVPB  Status:  Discontinued        1 g 200 mL/hr over 30 Minutes Intravenous Every 24 hours 09/14/20 2205 09/17/20 1011   09/14/20 2015  cefTRIAXone (ROCEPHIN) 1 g in sodium chloride 0.9 % 100 mL IVPB        1 g 200 mL/hr over 30 Minutes Intravenous  Once 09/14/20 2007 09/14/20 2210      Subjective: Without complaints this AM  Objective: Vitals:   09/20/20 1938 09/20/20 2309 09/21/20 0458 09/21/20 1231  BP: 126/89 99/79 106/71 (!) 101/58  Pulse: 98 92 68 87  Resp:    (!) 22  Temp:  97.9 F (36.6 C) 98.3 F (36.8 C) 97.9 F (36.6 C)  TempSrc:    Oral  SpO2:  99% 96% 97%  Weight:      Height:        Intake/Output Summary (Last 24 hours) at 09/21/2020 1446 Last data filed at 09/21/2020 1100 Gross per 24 hour  Intake 730 ml  Output 400 ml  Net 330 ml   Filed Weights   09/16/20 1105  Weight: 80 kg    Examination: General exam: Conversant, in no acute distress Respiratory system: normal chest rise, clear, no audible wheezing Cardiovascular system: regular rhythm, s1-s2 Gastrointestinal system: Nondistended, nontender, pos BS Central nervous system: No seizures, no tremors Extremities: No cyanosis, no joint deformities Skin: No rashes, no pallor Psychiatry: Affect normal // no auditory hallucinations   Data Reviewed: I have personally reviewed following labs and imaging studies  CBC: Recent Labs  Lab 09/14/20 1607 09/15/20 0458 09/16/20 0814 09/17/20 0601 09/18/20 0544 09/20/20 0456 09/21/20 0510  WBC 11.4* 12.5* 10.4 4.2 4.3 5.6 6.7  NEUTROABS 10.4* 11.2*  --   --   --   --   --   HGB 12.7* 12.0* 11.6* 10.7* 10.1* 11.5* 11.0*  HCT 37.7* 36.1* 35.0* 32.1*  31.4* 35.4* 34.3*  MCV 87.9 90.0 90.9 89.9 92.1 92.2 94.2  PLT 56* 47* 53* 66* 112* 201 268   Basic Metabolic Panel: Recent Labs  Lab 09/17/20 0601 09/18/20 0544 09/19/20 0548 09/20/20 0456 09/21/20 0510  NA 125* 128* 129* 127* 129*  K 2.8* 3.3* 3.5 3.8 3.7  CL 94* 98 99 96* 100  CO2 19* 18* 20* 21* 20*  GLUCOSE 88 110* 106* 99 94  BUN <5* <5* 5* 5* 8  CREATININE 0.55* 0.57* 0.61 0.67 0.56*  CALCIUM 8.3* 8.5* 8.8* 8.8* 8.9  MG 1.3* 1.8 1.6* 1.9 1.7  PHOS 2.4*  --   --   --   --    GFR: Estimated  Creatinine Clearance: 115.9 mL/min (A) (by C-G formula based on SCr of 0.56 mg/dL (L)). Liver Function Tests: Recent Labs  Lab 09/17/20 0601 09/18/20 0544 09/19/20 0548 09/20/20 0456 09/21/20 0510  AST 148* 150* 140* 131* 123*  ALT 44 48* 53* 54* 59*  ALKPHOS 283* 270* 300* 310* 289*  BILITOT 5.3* 4.2* 4.1* 4.5* 3.5*  PROT 7.9 7.8 7.9 8.5* 8.2*  ALBUMIN 2.7* 2.7* 2.7* 3.2* 2.9*   No results for input(s): LIPASE, AMYLASE in the last 168 hours. Recent Labs  Lab 09/20/20 0824  AMMONIA 34   Coagulation Profile: Recent Labs  Lab 09/15/20 0458  INR 1.0   Cardiac Enzymes: Recent Labs  Lab 09/14/20 1607  CKTOTAL 130   BNP (last 3 results) No results for input(s): PROBNP in the last 8760 hours. HbA1C: No results for input(s): HGBA1C in the last 72 hours. CBG: No results for input(s): GLUCAP in the last 168 hours. Lipid Profile: No results for input(s): CHOL, HDL, LDLCALC, TRIG, CHOLHDL, LDLDIRECT in the last 72 hours. Thyroid Function Tests: No results for input(s): TSH, T4TOTAL, FREET4, T3FREE, THYROIDAB in the last 72 hours. Anemia Panel: No results for input(s): VITAMINB12, FOLATE, FERRITIN, TIBC, IRON, RETICCTPCT in the last 72 hours. Sepsis Labs: No results for input(s): PROCALCITON, LATICACIDVEN in the last 168 hours.  Recent Results (from the past 240 hour(s))  Resp Panel by RT-PCR (Flu A&B, Covid) Nasopharyngeal Swab     Status: None   Collection Time:  09/14/20  4:07 PM   Specimen: Nasopharyngeal Swab; Nasopharyngeal(NP) swabs in vial transport medium  Result Value Ref Range Status   SARS Coronavirus 2 by RT PCR NEGATIVE NEGATIVE Final    Comment: (NOTE) SARS-CoV-2 target nucleic acids are NOT DETECTED.  The SARS-CoV-2 RNA is generally detectable in upper respiratory specimens during the acute phase of infection. The lowest concentration of SARS-CoV-2 viral copies this assay can detect is 138 copies/mL. A negative result does not preclude SARS-Cov-2 infection and should not be used as the sole basis for treatment or other patient management decisions. A negative result may occur with  improper specimen collection/handling, submission of specimen other than nasopharyngeal swab, presence of viral mutation(s) within the areas targeted by this assay, and inadequate number of viral copies(<138 copies/mL). A negative result must be combined with clinical observations, patient history, and epidemiological information. The expected result is Negative.  Fact Sheet for Patients:  BloggerCourse.com  Fact Sheet for Healthcare Providers:  SeriousBroker.it  This test is no t yet approved or cleared by the [place] FDA and  has been authorized for detection and/or diagnosis of SARS-CoV-2 by FDA under an Emergency Use Authorization (EUA). This EUA will remain  in effect (meaning this test can be used) for the duration of the COVID-19 declaration under Section 564(b)(1) of the Act, 21 U.S.C.section 360bbb-3(b)(1), unless the authorization is terminated  or revoked sooner.       Influenza A by PCR NEGATIVE NEGATIVE Final   Influenza B by PCR NEGATIVE NEGATIVE Final    Comment: (NOTE) The Xpert Xpress SARS-CoV-2/FLU/RSV plus assay is intended as an aid in the diagnosis of influenza from Nasopharyngeal swab specimens and should not be used as a sole basis for treatment. Nasal washings  and aspirates are unacceptable for Xpert Xpress SARS-CoV-2/FLU/RSV testing.  Fact Sheet for Patients: BloggerCourse.com  Fact Sheet for Healthcare Providers: SeriousBroker.it  This test is not yet approved or cleared by the [place] FDA and has been authorized for detection and/or diagnosis of SARS-CoV-2  by FDA under an Emergency Use Authorization (EUA). This EUA will remain in effect (meaning this test can be used) for the duration of the COVID-19 declaration under Section 564(b)(1) of the Act, 21 U.S.C. section 360bbb-3(b)(1), unless the authorization is terminated or revoked.  Performed at [facility], [address] [place], [place] [address]   Urine Culture     Status: Abnormal   Collection Time: 09/14/20  6:22 PM   Specimen: Urine, Random  Result Value Ref Range Status   Specimen Description   Final    URINE, RANDOM Performed at [facility], [address] [place], [place] [address]    Special Requests   Final    NONE Performed at [facility], [address] [place], [place] [address]    Culture >=100,000 COLONIES/mL KLEBSIELLA PNEUMONIAE (A)  Final   Report Status 09/16/2020 FINAL  Final   Organism ID, Bacteria KLEBSIELLA PNEUMONIAE (A)  Final      Susceptibility   Klebsiella pneumoniae - MIC*    AMPICILLIN RESISTANT Resistant     CEFAZOLIN <=4 SENSITIVE Sensitive     CEFEPIME <=0.12 SENSITIVE Sensitive     CEFTRIAXONE <=0.25 SENSITIVE Sensitive     CIPROFLOXACIN <=0.25 SENSITIVE Sensitive     GENTAMICIN <=1 SENSITIVE Sensitive     IMIPENEM <=0.25 SENSITIVE Sensitive     NITROFURANTOIN 64 INTERMEDIATE Intermediate     TRIMETH/SULFA <=20 SENSITIVE Sensitive     AMPICILLIN/SULBACTAM 4 SENSITIVE Sensitive     PIP/TAZO <=4 SENSITIVE Sensitive     * >=100,000 COLONIES/mL KLEBSIELLA PNEUMONIAE     Radiology Studies: No results found.  Scheduled  Meds: . folic acid  1 mg Oral Daily  . influenza vac split quadrivalent PF  0.5 mL Intramuscular Tomorrow-1000  . multivitamin with minerals  1 tablet Oral Daily  . nicotine  21 mg Transdermal Daily  . thiamine  100 mg Oral Daily   Or  . thiamine  100 mg Intravenous Daily   Continuous Infusions:    LOS: 6 days   Rickey BarbaraStephen Alia Parsley, MD [facility] Pager On Amion  If 7PM-7AM, please contact night-coverage 09/21/2020, 2:46 PM

## 2020-09-21 NOTE — Plan of Care (Signed)

## 2020-09-21 NOTE — Progress Notes (Signed)
Physical Therapy Treatment Patient Details Name: Dylan Weaver MRN: [id number] DOB: 05/16/1966 Today's Date: 09/21/2020    History of Present Illness 54 yo male admitted with ETOH withdrawal, weakness, found on floor by ambulance. Hx of ETOH abuse.    PT Comments     pt in bed eating lunch AxO x 3 following all commands and appears at prior level of cognition but demonstrates decrease awareness to current impairments and understanding of physical effects of long term alcohol.  Assisted OOB to amb to bathroom.  Very unsteady gait requiring "furniture" to steady self.  Poor self correction response, poor coordination and ataxic hyper movements which pt tries to mask with excuses (floor/socks)  Pt does admit to B foot pain/tingling which he calls "Neuropathy" but not listed in Hx.  Pt was able to have a BM and perform self peri care but had to steady self using rail on wall.  Assisted with amb in hallway.  Did NOT use a walker this session to access gait.  Very drunken, ataxic gait.  HIGH FALL RISK.   Pt grew up here in [place] and recently moved back in Oct from [place] due to a Divorce. He is  currently living with his 52 year old mother.      Follow Up Recommendations  SNF  [facility] (OP/Int Pt)    Equipment Recommendations       Recommendations for Other Services       Precautions / Restrictions Precautions Precautions: Fall Precaution Comments: CIWA protocol/fluid restrictions Restrictions Weight Bearing Restrictions: No    Mobility  Bed Mobility Overal bed mobility: Modified Independent             General bed mobility comments: self able required no physical assist  Transfers Overall transfer level: Needs assistance Equipment used: None Transfers: Sit to/from Stand;Stand Pivot Transfers Sit to Stand: Min guard Stand pivot transfers: Min guard;Min assist       General transfer comment: slightly unsteady assist for balance.  Pt appropriately  using B UE's to steady self  Ambulation/Gait Ambulation/Gait assistance: Min assist Gait Distance (Feet): 175 Feet Assistive device: None Gait Pattern/deviations: Step-through pattern;Decreased stride length;Drifts right/left Gait velocity: WFL   General Gait Details: amb to bathroom and in hallway without AD this session.  Slightly unsteady gait with occassional furniture/hall rail assist. Pt c/o "Neuropathy" B feet   Stairs             Wheelchair Mobility    Modified Rankin (Stroke Patients Only)       Balance Overall balance assessment: Needs assistance Sitting-balance support: Feet supported;No upper extremity supported Sitting balance-Leahy Scale: Good     Standing balance support: Single extremity supported;Bilateral upper extremity supported;No upper extremity supported Standing balance-Leahy Scale: Poor Standing balance comment: LOB when initially standing from EOB, uses back of legs to steady himself. Able to remains standing with perturbations, so losses of balance are unpredictable.               High Level Balance Comments: 30 Sec Sit to Stand test.            Cognition Arousal/Alertness: Awake/alert Behavior During Therapy: WFL for tasks assessed/performed Overall Cognitive Status: Within Functional Limits for tasks assessed Area of Impairment: Following commands;Safety/judgement;Problem solving                       Following Commands: Follows multi-step commands inconsistently Safety/Judgement: Decreased awareness of deficits;Decreased awareness of safety   Problem Solving:  Slow processing General Comments: AxO x 3 following all commands and appears at prior level of cognition      Exercises      General Comments        Pertinent Vitals/Pain Pain Assessment: Faces Faces Pain Scale: Hurts a little bit Pain Location: "Neuropathy" both feet Pain Descriptors / Indicators: Tingling;Discomfort Pain Intervention(s): Monitored  during session    Home Living Family/patient expects to be discharged to:: Skilled nursing facility Living Arrangements: Parent;Other (Comment) (Takes care of Mother who is in a wheelchair)   Type of Home: House Home Access: Stairs to enter Entrance Stairs-Rails: None Home Layout: Two level Home Equipment: Cane - single point;Walker - 2 wheels Additional Comments: (Pt reports that he can use his Mother's RW and SPC as needed as she is always in her WC.    Prior Function Level of Independence: Independent          PT Goals (current goals can now be found in the care plan section) Acute Rehab PT Goals Patient Stated Goal: To go home and take care of Mother. Progress towards PT goals: Progressing toward goals    Frequency    Min 3X/week      PT Plan Current plan remains appropriate    Co-evaluation              AM-PAC PT "6 Clicks" Mobility   Outcome Measure  Help needed turning from your back to your side while in a flat bed without using bedrails?: A Little Help needed moving from lying on your back to sitting on the side of a flat bed without using bedrails?: A Little Help needed moving to and from a bed to a chair (including a wheelchair)?: A Little Help needed standing up from a chair using your arms (e.g., wheelchair or bedside chair)?: A Little Help needed to walk in hospital room?: A Lot Help needed climbing 3-5 steps with a railing? : A Lot 6 Click Score: 16    End of Session   Activity Tolerance: Patient tolerated treatment well Patient left: in bed;with call bell/phone within reach;with bed alarm set Nurse Communication: Mobility status PT Visit Diagnosis: Muscle weakness (generalized) (M62.81);Unsteadiness on feet (R26.81)     Time: 1202-1227 PT Time Calculation (min) (ACUTE ONLY): 25 min  Charges:  $Gait Training: 8-22 mins $Therapeutic Activity: 8-22 mins                     Felecia Shelling  PTA Acute  Rehabilitation Services Pager       [phone] Office      [phone]

## 2020-09-21 NOTE — Evaluation (Signed)
Occupational Therapy Evaluation Patient Details Name: Dylan Weaver MRN: [id number] DOB: 08/16/1966 Today's Date: 09/21/2020    History of Present Illness 54 yo male admitted with ETOH withdrawal, weakness, found on floor by ambulance. Hx of ETOH abuse.   Clinical Impression   Patient is currently requiring assistance with ADLs including moderate assist with toileting due to continued incontinence of bowel, Min Guard with LE dressing, Min Assist with bathing, and Min guard with standing at sink for grooming, all of which is below patient's typical baseline of being Independent.  During this evaluation, patient was limited by unpredictable losses of balance with need of increased assist with gait belt to avoid fall as well as impulsivity and decreased awareness of impairments, which has the potential to impact patient's safety and independence during functional mobility, as well as performance for ADLs. [employer] AM-PAC "6-clicks" Daily Activity Inpatient Short Form score of 18/24 indicates 46.65% ADL impairment this session. Pt also scored an 8 on the 30 Second Sit to Stand test indicating a high fall risk.  Patient lives with his Mother, who is in a wheelchair and pt cares for her.  Pt is otherwise unemployed but does drive.  Pt's Aunt comes to the home frequently to assist with heavier housework as needed.  Patient demonstrates good rehab potential, and should benefit from continued skilled occupational therapy services while in acute care to maximize safety, independence and quality of life at home.  Continued occupational therapy services in a short term SNF setting prior to return home is recommended.  ?   Follow Up Recommendations  SNF (If pt does not qulify for SNF, will need HH PT and OT)    Equipment Recommendations  Tub/shower seat    Recommendations for Other Services       Precautions / Restrictions Precautions Precautions: Fall Precaution Comments: CIWA  protocol Restrictions Weight Bearing Restrictions: No      Mobility Bed Mobility Overal bed mobility: Modified Independent                  Transfers Overall transfer level: Needs assistance Equipment used: Rolling walker (2 wheeled) Transfers: Sit to/from Stand Sit to Stand: Min guard         General transfer comment: Pt performed 30 Second sit to stand test from EOB with need of modifications to allow use of UEs to push, with poorly controlled descents and score of 8 while indicates high fall risk.    Balance Overall balance assessment: Needs assistance Sitting-balance support: Feet supported;No upper extremity supported Sitting balance-Leahy Scale: Good     Standing balance support: Single extremity supported;Bilateral upper extremity supported;No upper extremity supported Standing balance-Leahy Scale: Poor Standing balance comment: LOB when initially standing from EOB, uses back of legs to steady himself. Able to remains standing with perturbations, so losses of balance are unpredictable.               High Level Balance Comments: 30 Sec Sit to Stand test.           ADL either performed or assessed with clinical judgement   ADL Overall ADL's : Needs assistance/impaired Eating/Feeding: Independent   Grooming: Min guard;Wash/dry Programmer, applications Details (indicate cue type and reason): To stand at sink Upper Body Bathing: Set up;Sitting   Lower Body Bathing: Sit to/from stand;Sitting/lateral leans;Min guard   Upper Body Dressing : Set up;Sitting   Lower Body Dressing: Set up;Sit to/from stand;Min guard Lower Body Dressing Details (indicate cue type and reason): Pt  able to sit and don socks using figure 4 positioning in recliner. Min Guard needed for LE dressing while standing. Toilet Transfer: Regular Toilet;Grab bars;Min guard   Toileting- Clothing Manipulation and Hygiene: Moderate assistance Toileting - Clothing Manipulation Details (indicate cue  type and reason): Pt was incontinent of bowel while rushing to bathroom.  Pt required Mod Assist for fully cleaning self and doffing socks.     Functional mobility during ADLs: Min guard;Supervision/safety;Minimal assistance General ADL Comments: Pt moves quickly and unpredictably. Had LOB x 2 with need of Min As to steady.  Also see Mobility section.     Vision Baseline Vision/History: Wears glasses Wears Glasses: Reading only Patient Visual Report: No change from baseline Vision Assessment?: No apparent visual deficits     Perception     Praxis      Pertinent Vitals/Pain Pain Assessment: No/denies pain     Hand Dominance Right   Extremity/Trunk Assessment Upper Extremity Assessment Upper Extremity Assessment: Generalized weakness   Lower Extremity Assessment Lower Extremity Assessment: Generalized weakness   Cervical / Trunk Assessment Cervical / Trunk Assessment: Normal   Communication Communication Communication: No difficulties   Cognition Arousal/Alertness: Awake/alert Behavior During Therapy: Anxious Overall Cognitive Status: Impaired/Different from baseline Area of Impairment: Following commands;Safety/judgement;Problem solving                       Following Commands: Follows multi-step commands inconsistently Safety/Judgement: Decreased awareness of deficits;Decreased awareness of safety   Problem Solving: Slow processing     General Comments       Exercises     Shoulder Instructions      Home Living Family/patient expects to be discharged to:: Skilled nursing facility Living Arrangements: Parent;Other (Comment) (Takes care of Mother who is in a wheelchair)   Type of Home: House Home Access: Stairs to enter Secretary/administrator of Steps: 2 Entrance Stairs-Rails: None Home Layout: Two level Alternate Level Stairs-Number of Steps: 1 flight  Bathroom with Tub/shower; Standard heigh toilet and no grab bars in bathroom.              Home Equipment: Gilmer Mor - single point;Walker - 2 wheels   Additional Comments: (Pt reports that he can use his Mother's RW and SPC as needed as she is always in her WC.      Prior Functioning/Environment Level of Independence: Independent   Pt does have hired housekeepers as needed through [employer] and pt's Aunt comes to the home to help out frequently.               OT Problem List: Decreased strength;Decreased activity tolerance;Decreased safety awareness;Impaired balance (sitting and/or standing);Decreased knowledge of use of DME or AE;Decreased knowledge of precautions      OT Treatment/Interventions: Self-care/ADL training;Therapeutic exercise;Therapeutic activities;Cognitive remediation/compensation;Energy conservation;DME and/or AE instruction;Patient/family education;Balance training    OT Goals(Current goals can be found in the care plan section) Acute Rehab OT Goals Patient Stated Goal: To go home and take care of Mother. OT Goal Formulation: With patient Time For Goal Achievement: 10/05/20 Potential to Achieve Goals: Fair ADL Goals Pt Will Perform Grooming: standing;with modified independence Pt Will Perform Lower Body Bathing: sitting/lateral leans;sit to/from stand;with modified independence Pt Will Perform Lower Body Dressing: with modified independence;sit to/from stand;sitting/lateral leans Pt Will Transfer to Toilet: with modified independence Pt Will Perform Toileting - Clothing Manipulation and hygiene: with modified independence;sit to/from stand;sitting/lateral leans;with adaptive equipment Pt Will Perform Tub/Shower Transfer: Tub transfer;with supervision Additional ADL Goal #1: Pt will  engage in 10 min standing functional activities without loss of balance, in order to demonstrate improved activity tolerance and balance needed to perform ADLs safely at home.  OT Frequency: Min 2X/week   Barriers to D/C: Decreased caregiver support          Co-evaluation               AM-PAC OT "6 Clicks" Daily Activity     Outcome Measure Help from another person eating meals?: None Help from another person taking care of personal grooming?: A Little Help from another person toileting, which includes using toliet, bedpan, or urinal?: A Lot Help from another person bathing (including washing, rinsing, drying)?: A Little Help from another person to put on and taking off regular upper body clothing?: A Little Help from another person to put on and taking off regular lower body clothing?: A Little 6 Click Score: 18   End of Session Equipment Utilized During Treatment: Gait belt;Rolling walker Nurse Communication: Mobility status  Activity Tolerance: Patient tolerated treatment well Patient left: with nursing/sitter in room;Other (comment) (Sitting EOB with CNA)  OT Visit Diagnosis: Unsteadiness on feet (R26.81);History of falling (Z91.81);Other symptoms and signs involving cognitive function;Muscle weakness (generalized) (M62.81)                Time: 8756-4332 OT Time Calculation (min): 39 min Charges:  OT General Charges $OT Visit: 1 Visit OT Evaluation $OT Eval Low Complexity: 1 Low OT Treatments $Self Care/Home Management : 8-22 mins $Therapeutic Activity: 8-22 mins  Victorino Dike, OT Acute Rehab Services Office: [phone] 09/21/2020  Theodoro Clock 09/21/2020, 9:30 AM

## 2020-09-22 DIAGNOSIS — K7010 Alcoholic hepatitis without ascites: Secondary | ICD-10-CM

## 2020-09-22 DIAGNOSIS — F10239 Alcohol dependence with withdrawal, unspecified: Principal | ICD-10-CM

## 2020-09-22 DIAGNOSIS — D696 Thrombocytopenia, unspecified: Secondary | ICD-10-CM

## 2020-09-22 LAB — COMPREHENSIVE METABOLIC PANEL
ALT: 62 U/L — ABNORMAL HIGH (ref 0–44)
AST: 122 U/L — ABNORMAL HIGH (ref 15–41)
Albumin: 3.1 g/dL — ABNORMAL LOW (ref 3.5–5.0)
Alkaline Phosphatase: 304 U/L — ABNORMAL HIGH (ref 38–126)
Anion gap: 9 (ref 5–15)
BUN: 7 mg/dL (ref 6–20)
CO2: 21 mmol/L — ABNORMAL LOW (ref 22–32)
Calcium: 9 mg/dL (ref 8.9–10.3)
Chloride: 96 mmol/L — ABNORMAL LOW (ref 98–111)
Creatinine, Ser: 0.55 mg/dL — ABNORMAL LOW (ref 0.61–1.24)
GFR, Estimated: >60 mL/min (ref >60)
Glucose, Bld: 95 mg/dL (ref 70–99)
Potassium: 3.7 mmol/L (ref 3.5–5.1)
Sodium: 126 mmol/L — ABNORMAL LOW (ref 135–145)
Total Bilirubin: 3.3 mg/dL — ABNORMAL HIGH (ref 0.3–1.2)
Total Protein: 8.4 g/dL — ABNORMAL HIGH (ref 6.5–8.1)

## 2020-09-22 LAB — MAGNESIUM: Magnesium: 1.7 mg/dL (ref 1.7–2.4)

## 2020-09-22 LAB — SODIUM: Sodium: 126 mmol/L — ABNORMAL LOW (ref 135–145)

## 2020-09-22 NOTE — Progress Notes (Signed)
PROGRESS NOTE    Dylan Weaver  EXN:[id number] DOB: March 23, 1966 DOA: 09/14/2020 PCP: Patient, No Pcp Per    Chief Complaint  Patient presents with  . Alcohol Intoxication    Brief Narrative: 54 year old male with prior history of alcohol abuse presents to ED with withdrawal symptoms and thrombocytopenia, hyponatremia and UTI.  Patient was started on CIWA protocol. Patient's withdrawal symptoms have improved but his sodium has been persistently low.   Assessment & Plan:   Principal Problem:   Alcohol withdrawal (HCC) Active Problems:   Alcoholic hepatitis   Thrombocytopenia (HCC)   Normochromic normocytic anemia   Malnutrition of moderate degree    Alcohol withdrawal symptoms secondary to persistent alcohol abuse  Resolved with CIWA.     Klebsiella UTI Completed the course of antibiotics.    Elevated liver enzymes probably secondary to alcohol induced hepatitis Liver enzymes improving but not back to baseline yet.   Pancytopenia probably secondary to alcoholic hepatitis    Hypokalemia and hypomagnesemia Replace   Acute toxic metabolic encephalopathy worsened by hyponatremia Patient currently is alert and oriented and appears to be back to baseline but sodium is still low at 126.    Hyponatremia Probably secondary to alcohol abuse. Worsening, pt reports that he has been drinking a lot of water since his sodium is low.  Recommend water restriction to 1200 ml per day.  Recheck sodium tonight. If no improvement will need to start on tolvapton.     DVT prophylaxis: scd's Code Status:  FULL CODE.  Family Communication: none at bedside.  Disposition:   Status is: Inpatient  Remains inpatient appropriate because:Ongoing diagnostic testing needed not appropriate for outpatient work up, Unsafe d/c plan and Inpatient level of care appropriate due to severity of illness, persistently hyponatremic.    Dispo: The patient is from: Home               Anticipated d/c is to: Home              Anticipated d/c date is: 1 day              Patient currently is not medically stable to d/c.       Consultants:   None.    Procedures: none.   Antimicrobials: none.   Subjective: Feeling weak, generalized weakness, no dizziness.   Objective: Vitals:   09/21/20 1231 09/21/20 2200 09/22/20 0603 09/22/20 1314  BP: (!) 101/58 120/89 107/82 98/66  Pulse: 87 74 75 86  Resp: (!) 22   20  Temp: 97.9 F (36.6 C)  97.8 F (36.6 C) (!) 97.5 F (36.4 C)  TempSrc: Oral  Oral Oral  SpO2: 97%  98% 96%  Weight:      Height:        Intake/Output Summary (Last 24 hours) at 09/22/2020 1329 Last data filed at 09/22/2020 1005 Gross per 24 hour  Intake 1260 ml  Output 1850 ml  Net -590 ml   Filed Weights   09/16/20 1105  Weight: 80 kg    Examination:  General exam: Appears calm and comfortable  Respiratory system: Clear to auscultation. Respiratory effort normal. Cardiovascular system: S1 & S2 heard, RRR. No JVD,  No pedal edema. Gastrointestinal system: Abdomen is nondistended, soft and nontender.Normal bowel sounds heard. Central nervous system: Alert and oriented. No focal neurological deficits. Extremities: Symmetric 5 x 5 power. Skin: No rashes, lesions or ulcers Psychiatry: . Mood & affect appropriate.     Data Reviewed: I have personally  reviewed following labs and imaging studies  CBC: Recent Labs  Lab 09/16/20 0814 09/17/20 0601 09/18/20 0544 09/20/20 0456 09/21/20 0510  WBC 10.4 4.2 4.3 5.6 6.7  HGB 11.6* 10.7* 10.1* 11.5* 11.0*  HCT 35.0* 32.1* 31.4* 35.4* 34.3*  MCV 90.9 89.9 92.1 92.2 94.2  PLT 53* 66* 112* 201 268    Basic Metabolic Panel: Recent Labs  Lab 09/17/20 0601 09/18/20 0544 09/19/20 0548 09/20/20 0456 09/21/20 0510 09/22/20 0601  NA 125* 128* 129* 127* 129* 126*  K 2.8* 3.3* 3.5 3.8 3.7 3.7  CL 94* 98 99 96* 100 96*  CO2 19* 18* 20* 21* 20* 21*  GLUCOSE 88 110* 106* 99 94 95  BUN  <5* <5* 5* 5* 8 7  CREATININE 0.55* 0.57* 0.61 0.67 0.56* 0.55*  CALCIUM 8.3* 8.5* 8.8* 8.8* 8.9 9.0  MG 1.3* 1.8 1.6* 1.9 1.7 1.7  PHOS 2.4*  --   --   --   --   --     GFR: Estimated Creatinine Clearance: 115.9 mL/min (A) (by C-G formula based on SCr of 0.55 mg/dL (L)).  Liver Function Tests: Recent Labs  Lab 09/18/20 0544 09/19/20 0548 09/20/20 0456 09/21/20 0510 09/22/20 0601  AST 150* 140* 131* 123* 122*  ALT 48* 53* 54* 59* 62*  ALKPHOS 270* 300* 310* 289* 304*  BILITOT 4.2* 4.1* 4.5* 3.5* 3.3*  PROT 7.8 7.9 8.5* 8.2* 8.4*  ALBUMIN 2.7* 2.7* 3.2* 2.9* 3.1*    CBG: No results for input(s): GLUCAP in the last 168 hours.   Recent Results (from the past 240 hour(s))  Resp Panel by RT-PCR (Flu A&B, Covid) Nasopharyngeal Swab     Status: None   Collection Time: 09/14/20  4:07 PM   Specimen: Nasopharyngeal Swab; Nasopharyngeal(NP) swabs in vial transport medium  Result Value Ref Range Status   SARS Coronavirus 2 by RT PCR NEGATIVE NEGATIVE Final    Comment: (NOTE) SARS-CoV-2 target nucleic acids are NOT DETECTED.  The SARS-CoV-2 RNA is generally detectable in upper respiratory specimens during the acute phase of infection. The lowest concentration of SARS-CoV-2 viral copies this assay can detect is 138 copies/mL. A negative result does not preclude SARS-Cov-2 infection and should not be used as the sole basis for treatment or other patient management decisions. A negative result may occur with  improper specimen collection/handling, submission of specimen other than nasopharyngeal swab, presence of viral mutation(s) within the areas targeted by this assay, and inadequate number of viral copies(<138 copies/mL). A negative result must be combined with clinical observations, patient history, and epidemiological information. The expected result is Negative.  Fact Sheet for Patients:  BloggerCourse.com  Fact Sheet for Healthcare Providers:   SeriousBroker.it  This test is no t yet approved or cleared by the [place] FDA and  has been authorized for detection and/or diagnosis of SARS-CoV-2 by FDA under an Emergency Use Authorization (EUA). This EUA will remain  in effect (meaning this test can be used) for the duration of the COVID-19 declaration under Section 564(b)(1) of the Act, 21 U.S.C.section 360bbb-3(b)(1), unless the authorization is terminated  or revoked sooner.       Influenza A by PCR NEGATIVE NEGATIVE Final   Influenza B by PCR NEGATIVE NEGATIVE Final    Comment: (NOTE) The Xpert Xpress SARS-CoV-2/FLU/RSV plus assay is intended as an aid in the diagnosis of influenza from Nasopharyngeal swab specimens and should not be used as a sole basis for treatment. Nasal washings and aspirates are unacceptable for Xpert  Xpress SARS-CoV-2/FLU/RSV testing.  Fact Sheet for Patients: BloggerCourse.com  Fact Sheet for Healthcare Providers: SeriousBroker.it  This test is not yet approved or cleared by the [place] FDA and has been authorized for detection and/or diagnosis of SARS-CoV-2 by FDA under an Emergency Use Authorization (EUA). This EUA will remain in effect (meaning this test can be used) for the duration of the COVID-19 declaration under Section 564(b)(1) of the Act, 21 U.S.C. section 360bbb-3(b)(1), unless the authorization is terminated or revoked.  Performed at [facility], [address], [place], [place] [address]   Urine Culture     Status: Abnormal   Collection Time: 09/14/20  6:22 PM   Specimen: Urine, Random  Result Value Ref Range Status   Specimen Description   Final    URINE, RANDOM Performed at [facility], [address] [place], [place] [address]    Special Requests   Final    NONE Performed at [facility], [address], [place],  [place] [address]    Culture >=100,000 COLONIES/mL KLEBSIELLA PNEUMONIAE (A)  Final   Report Status 09/16/2020 FINAL  Final   Organism ID, Bacteria KLEBSIELLA PNEUMONIAE (A)  Final      Susceptibility   Klebsiella pneumoniae - MIC*    AMPICILLIN RESISTANT Resistant     CEFAZOLIN <=4 SENSITIVE Sensitive     CEFEPIME <=0.12 SENSITIVE Sensitive     CEFTRIAXONE <=0.25 SENSITIVE Sensitive     CIPROFLOXACIN <=0.25 SENSITIVE Sensitive     GENTAMICIN <=1 SENSITIVE Sensitive     IMIPENEM <=0.25 SENSITIVE Sensitive     NITROFURANTOIN 64 INTERMEDIATE Intermediate     TRIMETH/SULFA <=20 SENSITIVE Sensitive     AMPICILLIN/SULBACTAM 4 SENSITIVE Sensitive     PIP/TAZO <=4 SENSITIVE Sensitive     * >=100,000 COLONIES/mL KLEBSIELLA PNEUMONIAE         Radiology Studies: No results found.      Scheduled Meds: . folic acid  1 mg Oral Daily  . influenza vac split quadrivalent PF  0.5 mL Intramuscular Tomorrow-1000  . multivitamin with minerals  1 tablet Oral Daily  . nicotine  21 mg Transdermal Daily  . thiamine  100 mg Oral Daily   Or  . thiamine  100 mg Intravenous Daily   Continuous Infusions:   LOS: 7 days        Kathlen Mody, MD [facility]   To contact the attending provider between 7A-7P or the covering provider during after hours 7P-7A, please log into the web site [email or web address] and access using universal [place] password for that web site. If you do not have the password, please call the hospital operator.  09/22/2020, 1:29 PM

## 2020-09-22 NOTE — Progress Notes (Signed)
Physical Therapy Treatment Patient Details Name: Dylan Weaver MRN: [id number] DOB: 25-Mar-1966 Today's Date: 09/22/2020    History of Present Illness 54 yo male admitted with ETOH withdrawal, weakness, found on floor by ambulance. Hx of ETOH abuse.    PT Comments    Progressing with mobility. Pt remains at risk for falls when mobilizing but balance has improved compared to evaluation day. Pt stated that he is planning to d/c home. If SNF is not an option, and pt returns home, recommendation is for HHPT and 24/7 supervision/assist.     Follow Up Recommendations  SNF (HHPT and 24 hour supervision/assist if SNF not an option)     Equipment Recommendations  Rolling walker with 5" wheels    Recommendations for Other Services       Precautions / Restrictions Precautions Precautions: Fall Precaution Comments: CIWA protocol/fluid restrictions Restrictions Weight Bearing Restrictions: No    Mobility  Bed Mobility Overal bed mobility: Needs Assistance Bed Mobility: Supine to Sit;Sit to Supine     Supine to sit: Supervision;HOB elevated Sit to supine: Supervision;HOB elevated   General bed mobility comments: Supv for safety, lines  Transfers Overall transfer level: Needs assistance Equipment used: Rolling walker (2 wheeled);None Transfers: Sit to/from Stand Sit to Stand: Min guard            Ambulation/Gait Ambulation/Gait assistance: Min Chemical engineer (Feet): 300 Feet Assistive device: Rolling walker (2 wheeled);None Gait Pattern/deviations: Step-through pattern;Decreased stride length     General Gait Details: Walked x 1 with RW-Min guard assist; no overt LOB. Walked x 1 without device-Min assist to steady intermittently. Pt reached out for objects in environment to steady. Pt tolerated distance well. No c/o dizziness.   Stairs             Wheelchair Mobility    Modified Rankin (Stroke Patients Only)       Balance Overall balance  assessment: Needs assistance           Standing balance-Leahy Scale: Poor Standing balance comment: Dynamic-Unpredictable loss of balance intermittently. Static- Pt able to stand with close Min guard assist                            Cognition Arousal/Alertness: Awake/alert Behavior During Therapy: [facility] - Eat for tasks assessed/performed   Area of Impairment: Problem solving;Safety/judgement                         Safety/Judgement: Decreased awareness of deficits   Problem Solving: Requires verbal cues        Exercises      General Comments        Pertinent Vitals/Pain Pain Assessment: No/denies pain    Home Living                      Prior Function            PT Goals (current goals can now be found in the care plan section) Progress towards PT goals: Progressing toward goals    Frequency    Min 3X/week      PT Plan Current plan remains appropriate    Co-evaluation              AM-PAC PT "6 Clicks" Mobility   Outcome Measure  Help needed turning from your back to your side while in a flat bed without using bedrails?: A Little Help needed moving from  lying on your back to sitting on the side of a flat bed without using bedrails?: A Little Help needed moving to and from a bed to a chair (including a wheelchair)?: A Little Help needed standing up from a chair using your arms (e.g., wheelchair or bedside chair)?: A Little Help needed to walk in hospital room?: A Little Help needed climbing 3-5 steps with a railing? : A Lot 6 Click Score: 17    End of Session Equipment Utilized During Treatment: Gait belt Activity Tolerance: Patient tolerated treatment well Patient left: in bed;with call bell/phone within reach;with bed alarm set   PT Visit Diagnosis: Muscle weakness (generalized) (M62.81);Unsteadiness on feet (R26.81)     Time: 1610-9604 PT Time Calculation (min) (ACUTE ONLY): 11 min  Charges:  $Gait Training:  8-22 mins              Faye Ramsay, PT Acute Rehabilitation  Office: [phone] Pager: [phone]

## 2020-09-22 NOTE — TOC Progression Note (Signed)
Transition of Care [facility]) - Progression Note    Patient Details  Name: Dylan Weaver MRN: [id number] Date of Birth: 14-Nov-1965  [facility] CM/SW Contact  [place], [place], [place] Phone Number: 09/22/2020, 3:00 PM  Clinical Narrative:     Patient has [facility] benefits only, he will not be able to get SNF placement with [facility], but he can get home health services.  CSW continuing to follow patient's progress throughout discharge planning.  CSW updated bedside nurse and physician.       Expected Discharge Plan and Services                                                 Social Determinants of Health (SDOH) Interventions    Readmission Risk Interventions No flowsheet data found.

## 2020-09-23 LAB — HEPATIC FUNCTION PANEL
ALT: 62 U/L — ABNORMAL HIGH (ref 0–44)
AST: 104 U/L — ABNORMAL HIGH (ref 15–41)
Albumin: 3.2 g/dL — ABNORMAL LOW (ref 3.5–5.0)
Alkaline Phosphatase: 292 U/L — ABNORMAL HIGH (ref 38–126)
Bilirubin, Direct: 1.4 mg/dL — ABNORMAL HIGH (ref 0.0–0.2)
Indirect Bilirubin: 1.5 mg/dL — ABNORMAL HIGH (ref 0.3–0.9)
Total Bilirubin: 2.9 mg/dL — ABNORMAL HIGH (ref 0.3–1.2)
Total Protein: 8.6 g/dL — ABNORMAL HIGH (ref 6.5–8.1)

## 2020-09-23 LAB — BASIC METABOLIC PANEL
Anion gap: 11 (ref 5–15)
BUN: 7 mg/dL (ref 6–20)
CO2: 20 mmol/L — ABNORMAL LOW (ref 22–32)
Calcium: 8.9 mg/dL (ref 8.9–10.3)
Chloride: 96 mmol/L — ABNORMAL LOW (ref 98–111)
Creatinine, Ser: 0.72 mg/dL (ref 0.61–1.24)
GFR, Estimated: >60 mL/min (ref >60)
Glucose, Bld: 112 mg/dL — ABNORMAL HIGH (ref 70–99)
Potassium: 3.2 mmol/L — ABNORMAL LOW (ref 3.5–5.1)
Sodium: 127 mmol/L — ABNORMAL LOW (ref 135–145)

## 2020-09-23 LAB — SODIUM: Sodium: 130 mmol/L — ABNORMAL LOW (ref 135–145)

## 2020-09-23 MED ORDER — ENSURE ENLIVE PO LIQD
237.0000 mL | ORAL | Status: DC
  Administered 2020-09-23: 11:00:00 237 mL via ORAL

## 2020-09-23 MED ORDER — TOLVAPTAN 15 MG PO TABS
15.0000 mg | ORAL_TABLET | Freq: Once | ORAL | Status: AC
  Administered 2020-09-23: 11:00:00 15 mg via ORAL
  Filled 2020-09-23: qty 1

## 2020-09-23 MED ORDER — POTASSIUM CHLORIDE CRYS ER 20 MEQ PO TBCR
40.0000 meq | EXTENDED_RELEASE_TABLET | Freq: Once | ORAL | Status: AC
  Administered 2020-09-23: 11:00:00 40 meq via ORAL
  Filled 2020-09-23: qty 2

## 2020-09-23 MED ORDER — PROSOURCE PLUS PO LIQD
30.0000 mL | Freq: Two times a day (BID) | ORAL | Status: DC
  Administered 2020-09-23: 11:00:00 30 mL via ORAL
  Filled 2020-09-23: qty 30

## 2020-09-23 MED ORDER — SODIUM CHLORIDE 1 G PO TABS
1.0000 g | ORAL_TABLET | Freq: Three times a day (TID) | ORAL | Status: DC
  Administered 2020-09-23 (×2): 1 g via ORAL
  Filled 2020-09-23 (×4): qty 1

## 2020-09-23 NOTE — Progress Notes (Signed)
PROGRESS NOTE    Brack Shaddock Hauger  LZJ:[id number] DOB: [birth year] DOA: 09/14/2020 PCP: Patient, No Pcp Per    Chief Complaint  Patient presents with  . Alcohol Intoxication    Brief Narrative: 54 year old male with prior history of alcohol abuse presents to ED with withdrawal symptoms and thrombocytopenia, hyponatremia and UTI.  Patient was started on CIWA protocol. Patient's withdrawal symptoms have improved but his sodium has been persistently low.   Assessment & Plan:   Principal Problem:   Alcohol withdrawal (HCC) Active Problems:   Alcoholic hepatitis   Thrombocytopenia (HCC)   Normochromic normocytic anemia   Malnutrition of moderate degree    Alcohol withdrawal symptoms secondary to persistent alcohol abuse  Resolved with CIWA. No tremors seen. No changes in meds.      Klebsiella UTI Completed the course of antibiotics.    Elevated liver enzymes probably secondary to alcohol induced hepatitis Liver enzymes improving but not back to baseline yet. Recommend checking liver enzymes in 2 to 4 weeks.    Pancytopenia probably secondary to alcoholic hepatitis Slowly improving.     Hypokalemia and hypomagnesemia Replace   Acute toxic metabolic encephalopathy worsened by hyponatremia Patient currently is alert and oriented and appears to be back to baseline but sodium is still low at 126.    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 Worsening, pt reports that he has been drinking a lot of water since his sodium is low.  Recommend water restriction to 1200 ml per day.  Recheck sodium tonight.NO improvement, will start him on tolvaptan.      DVT prophylaxis: scd's Code Status:  FULL CODE.  Family Communication: none at bedside.  Disposition:   Status is: Inpatient  Remains inpatient appropriate because:Ongoing diagnostic testing needed not appropriate for outpatient work up, Unsafe d/c plan and Inpatient level of care  appropriate due to severity of illness, persistently hyponatremic.    Dispo: The patient is from: Home              Anticipated d/c is to: Home              Anticipated d/c date is: 1 day              Patient currently is not medically stable to d/c.       Consultants:   None.    Procedures: none.   Antimicrobials: none.   Subjective: Patient reports feeling better today much better and work with PT. Objective: Vitals:   09/22/20 1314 09/22/20 2200 09/23/20 0529 09/23/20 1403  BP: 98/66 107/80 97/66 103/80  Pulse: 86 96 75 85  Resp: 20 17 17 17   Temp: (!) 97.5 F (36.4 C) 97.7 F (36.5 C) (!) 97.5 F (36.4 C) 97.7 F (36.5 C)  TempSrc: Oral Oral Oral Oral  SpO2: 96% 97% 96% 97%  Weight:      Height:        Intake/Output Summary (Last 24 hours) at 09/23/2020 1750 Last data filed at 09/23/2020 1300 Gross per 24 hour  Intake 1540 ml  Output 1300 ml  Net 240 ml   Filed Weights   09/16/20 1105  Weight: 80 kg    Examination:  General exam: Alert and comfortable, not in any distress Respiratory system: Clear to auscultation bilaterally, no wheezing or rhonchi Cardiovascular system: S1-S2 heard, regular rate rhythm, no JVD Gastrointestinal system: Diminished soft, nondistended, bowel sounds normal. Central nervous system: Alert and oriented, grossly nonfocal Extremities: No pedal edema. Skin: No rashes  seen psychiatry: . Mood & affect appropriate.     Data Reviewed: I have personally reviewed following labs and imaging studies  CBC: Recent Labs  Lab 09/17/20 0601 09/18/20 0544 09/20/20 0456 09/21/20 0510  WBC 4.2 4.3 5.6 6.7  HGB 10.7* 10.1* 11.5* 11.0*  HCT 32.1* 31.4* 35.4* 34.3*  MCV 89.9 92.1 92.2 94.2  PLT 66* 112* 201 268    Basic Metabolic Panel: Recent Labs  Lab 09/17/20 0601 09/18/20 0544 09/19/20 0548 09/20/20 0456 09/21/20 0510 09/22/20 0601 09/22/20 1831 09/23/20 0919 09/23/20 1643  NA 125* 128* 129* 127* 129* 126* 126*  127* 130*  K 2.8* 3.3* 3.5 3.8 3.7 3.7  --  3.2*  --   CL 94* 98 99 96* 100 96*  --  96*  --   CO2 19* 18* 20* 21* 20* 21*  --  20*  --   GLUCOSE 88 110* 106* 99 94 95  --  112*  --   BUN <5* <5* 5* 5* 8 7  --  7  --   CREATININE 0.55* 0.57* 0.61 0.67 0.56* 0.55*  --  0.72  --   CALCIUM 8.3* 8.5* 8.8* 8.8* 8.9 9.0  --  8.9  --   MG 1.3* 1.8 1.6* 1.9 1.7 1.7  --   --   --   PHOS 2.4*  --   --   --   --   --   --   --   --     GFR: Estimated Creatinine Clearance: 115.9 mL/min (by C-G formula based on SCr of 0.72 mg/dL).  Liver Function Tests: Recent Labs  Lab 09/19/20 0548 09/20/20 0456 09/21/20 0510 09/22/20 0601 09/23/20 0919  AST 140* 131* 123* 122* 104*  ALT 53* 54* 59* 62* 62*  ALKPHOS 300* 310* 289* 304* 292*  BILITOT 4.1* 4.5* 3.5* 3.3* 2.9*  PROT 7.9 8.5* 8.2* 8.4* 8.6*  ALBUMIN 2.7* 3.2* 2.9* 3.1* 3.2*    CBG: No results for input(s): GLUCAP in the last 168 hours.   Recent Results (from the past 240 hour(s))  Resp Panel by RT-PCR (Flu A&B, Covid) Nasopharyngeal Swab     Status: None   Collection Time: 09/14/20  4:07 PM   Specimen: Nasopharyngeal Swab; Nasopharyngeal(NP) swabs in vial transport medium  Result Value Ref Range Status   SARS Coronavirus 2 by RT PCR NEGATIVE NEGATIVE Final    Comment: (NOTE) SARS-CoV-2 target nucleic acids are NOT DETECTED.  The SARS-CoV-2 RNA is generally detectable in upper respiratory specimens during the acute phase of infection. The lowest concentration of SARS-CoV-2 viral copies this assay can detect is 138 copies/mL. A negative result does not preclude SARS-Cov-2 infection and should not be used as the sole basis for treatment or other patient management decisions. A negative result may occur with  improper specimen collection/handling, submission of specimen other than nasopharyngeal swab, presence of viral mutation(s) within the areas targeted by this assay, and inadequate number of viral copies(<138 copies/mL). A  negative result must be combined with clinical observations, patient history, and epidemiological information. The expected result is Negative.  Fact Sheet for Patients:  BloggerCourse.com  Fact Sheet for Healthcare Providers:  SeriousBroker.it  This test is no t yet approved or cleared by the [place] FDA and  has been authorized for detection and/or diagnosis of SARS-CoV-2 by FDA under an Emergency Use Authorization (EUA). This EUA will remain  in effect (meaning this test can be used) for the duration of the COVID-19 declaration under  Section 564(b)(1) of the Act, 21 U.S.C.section 360bbb-3(b)(1), unless the authorization is terminated  or revoked sooner.       Influenza A by PCR NEGATIVE NEGATIVE Final   Influenza B by PCR NEGATIVE NEGATIVE Final    Comment: (NOTE) The Xpert Xpress SARS-CoV-2/FLU/RSV plus assay is intended as an aid in the diagnosis of influenza from Nasopharyngeal swab specimens and should not be used as a sole basis for treatment. Nasal washings and aspirates are unacceptable for Xpert Xpress SARS-CoV-2/FLU/RSV testing.  Fact Sheet for Patients: BloggerCourse.com  Fact Sheet for Healthcare Providers: SeriousBroker.it  This test is not yet approved or cleared by the [place] FDA and has been authorized for detection and/or diagnosis of SARS-CoV-2 by FDA under an Emergency Use Authorization (EUA). This EUA will remain in effect (meaning this test can be used) for the duration of the COVID-19 declaration under Section 564(b)(1) of the Act, 21 U.S.C. section 360bbb-3(b)(1), unless the authorization is terminated or revoked.  Performed at [facility], [address], [place], [place] [address]   Urine Culture     Status: Abnormal   Collection Time: 09/14/20  6:22 PM   Specimen: Urine, Random  Result Value Ref Range Status    Specimen Description   Final    URINE, RANDOM Performed at [facility], [address] [place], [place] [address]    Special Requests   Final    NONE Performed at [facility], [address] [place], [place] [address]    Culture >=100,000 COLONIES/mL KLEBSIELLA PNEUMONIAE (A)  Final   Report Status 09/16/2020 FINAL  Final   Organism ID, Bacteria KLEBSIELLA PNEUMONIAE (A)  Final      Susceptibility   Klebsiella pneumoniae - MIC*    AMPICILLIN RESISTANT Resistant     CEFAZOLIN <=4 SENSITIVE Sensitive     CEFEPIME <=0.12 SENSITIVE Sensitive     CEFTRIAXONE <=0.25 SENSITIVE Sensitive     CIPROFLOXACIN <=0.25 SENSITIVE Sensitive     GENTAMICIN <=1 SENSITIVE Sensitive     IMIPENEM <=0.25 SENSITIVE Sensitive     NITROFURANTOIN 64 INTERMEDIATE Intermediate     TRIMETH/SULFA <=20 SENSITIVE Sensitive     AMPICILLIN/SULBACTAM 4 SENSITIVE Sensitive     PIP/TAZO <=4 SENSITIVE Sensitive     * >=100,000 COLONIES/mL KLEBSIELLA PNEUMONIAE         Radiology Studies: No results found.      Scheduled Meds: . (feeding supplement) PROSource Plus  30 mL Oral BID WC  . feeding supplement  237 mL Oral Q24H  . folic acid  1 mg Oral Daily  . influenza vac split quadrivalent PF  0.5 mL Intramuscular Tomorrow-1000  . multivitamin with minerals  1 tablet Oral Daily  . nicotine  21 mg Transdermal Daily  . sodium chloride  1 g Oral TID WC  . thiamine  100 mg Oral Daily   Or  . thiamine  100 mg Intravenous Daily   Continuous Infusions:   LOS: 8 days        Kathlen Mody, MD [facility]   To contact the attending provider between 7A-7P or the covering provider during after hours 7P-7A, please log into the web site [email or web address] and access using universal [place] password for that web site. If you do not have the password, please call the hospital operator.  09/23/2020, 5:50 PM

## 2020-09-23 NOTE — Progress Notes (Addendum)
Nutrition Follow-up  DOCUMENTATION CODES:   Non-severe (moderate) malnutrition in context of social or environmental circumstances  INTERVENTION:   Ensure Enlive po daily, each supplement provides 350 kcal and 20 grams of protein  PROSource Plus 30 mL BID, each supplement provides 100 kcal and 15 grams of protein.   MVI with minerals  NUTRITION DIAGNOSIS:   Moderate Malnutrition related to social / environmental circumstances (alcohol abuse) as evidenced by moderate muscle depletion,mild muscle depletion,mild fat depletion.  Ongoing.  GOAL:   Patient will meet greater than or equal to 90% of their needs  Progressing.  MONITOR:   PO intake,Supplement acceptance,Labs,Weight trends  ASSESSMENT:   Pt is a 54 y.o. male with history of alcohol abuse, with an increase in alcohol consumption over the past few months after his divorce. EMS arrived to find pt on floor in unkempt situation. Pt admitted for alcohol withdrawal and generalized weakness likely from dehydration and deconditioning.  Patient currently consuming 100% of meals at this time.  Per MD note, pt was placed on fluid restriction d/t hyponatremia.  RD will order supplements to add kcals and protein given malnutrition status.  Admission weight: 176 lbs. No new weights for admission.  Medications: Folic acid, Multivitamin with minerals daily, Thiamine  Labs reviewed: Low Na, K  Diet Order:   Diet Order            Diet regular Room service appropriate? Yes; Fluid consistency: Thin; Fluid restriction: 1200 mL Fluid  Diet effective now                 EDUCATION NEEDS:   Not appropriate for education at this time  Skin:  Skin Assessment: Skin Integrity Issues: Skin Integrity Issues:: Other (Comment) Other: Excoriated- buttocks, scrotum, sacrum  Last BM:  12/16  Height:   Ht Readings from Last 1 Encounters:  09/16/20 6' (1.829 m)    Weight:   Wt Readings from Last 1 Encounters:  09/16/20 80  kg    Ideal Body Weight:  80.9 kg  BMI:  Body mass index is 23.92 kg/m.  Estimated Nutritional Needs:   Kcal:  2000-2200  Protein:  105-120 grams  Fluid:  >2 L/day  Tilda Franco, MS, RD, LDN Inpatient Clinical Dietitian Contact information available via Amion

## 2020-09-23 NOTE — Progress Notes (Signed)
Physical Therapy Treatment Patient Details Name: Dylan Weaver MRN: [id number] DOB: 09-Jun-1966 Today's Date: 09/23/2020    History of Present Illness 54 yo male admitted with ETOH withdrawal, weakness, found on floor by ambulance. Hx of ETOH abuse.    PT Comments    MUCH improved mobility.  Pt appears at prior level of cognition and mobility.  Will have an LPT review chart as pt no longer indicates a skilled PT need both Acute and post Acute stay. Pt plans to return home where he lives with and cares for his 42 year old mother.  "I just got to lay off the juice".   Follow Up Recommendations  No PT follow up     Equipment Recommendations  None recommended by PT    Recommendations for Other Services       Precautions / Restrictions Precautions Precautions: Fall Precaution Comments: CIWA protocol/fluid restrictions Restrictions Weight Bearing Restrictions: No    Mobility  Bed Mobility Overal bed mobility: Modified Independent                Transfers Overall transfer level: Modified independent   Transfers: Sit to/from Stand Sit to Stand: Supervision    demonstrating good safety cognition and use of hands to steady self with functional reaction time       Ambulation/Gait Ambulation/Gait assistance: Modified independent (Device/Increase time) Gait Distance (Feet): 350 Feet Assistive device: None Gait Pattern/deviations: Step-through pattern     General Gait Details: appears at prior level of mobility, good alternating gait, WNL gait speed for age and no longer present with any balance/coordination deficits   Stairs             Wheelchair Mobility    Modified Rankin (Stroke Patients Only)       Balance Overall balance assessment: Mild deficits observed, not formally tested                                          Cognition Arousal/Alertness: Awake/alert Behavior During Therapy: WFL for tasks  assessed/performed Overall Cognitive Status: Within Functional Limits for tasks assessed                                 General Comments: pt appears at 100% prior level of cognition      Exercises      General Comments        Pertinent Vitals/Pain Pain Assessment: No/denies pain    Home Living                      Prior Function            PT Goals (current goals can now be found in the care plan section) Acute Rehab PT Goals Patient Stated Goal: To go home and take care of Mother.    Frequency    Min 3X/week      PT Plan Discharge plan needs to be updated    Co-evaluation              AM-PAC PT "6 Clicks" Mobility   Outcome Measure  Help needed turning from your back to your side while in a flat bed without using bedrails?: None Help needed moving from lying on your back to sitting on the side of a flat bed without using bedrails?: None Help  needed moving to and from a bed to a chair (including a wheelchair)?: None Help needed standing up from a chair using your arms (e.g., wheelchair or bedside chair)?: None Help needed to walk in hospital room?: None Help needed climbing 3-5 steps with a railing? : None 6 Click Score: 24    End of Session Equipment Utilized During Treatment: Gait belt Activity Tolerance: Patient tolerated treatment well Patient left: in chair Nurse Communication: Mobility status PT Visit Diagnosis: Muscle weakness (generalized) (M62.81);Unsteadiness on feet (R26.81)     Time: 1335-1350 PT Time Calculation (min) (ACUTE ONLY): 15 min  Charges:  $Gait Training: 8-22 mins                     {Hawa Henly  PTA Acute  Rehabilitation Services Pager      [phone] Office      [phone]

## 2020-09-23 NOTE — Progress Notes (Signed)
Occupational Therapy Treatment Patient Details Name: Dylan Weaver MRN: [id number] DOB: [birth year] Today's Date: 09/23/2020    History of present illness 54 yo male admitted with ETOH withdrawal, weakness, found on floor by ambulance. Hx of ETOH abuse.   OT comments  Dylan Weaver is alert and oriented this morning. Reports he has been getting up and going to the bathroom independently - though nursing did not report this. Patient demonstrates ability to perform independent bed mobility, ambulated to bathroom, demonstrated toilet transfer and ability to manage clothing for toileting. Patient ambulated in the hall approx 100 feet without dme and without overt loss of balance. Patient placed his hand out twice to steady himself. On return to recliner patient stubbed his toe on bedside table - without loss of balance. Patient demonstrated ability to don socks in seated position. Patient reports he doesn't have a shower chair but that the curtain rod "is really steady." Patient reports no concerns in regards to home. At this time patient demonstrates ability to perform basic ADLs and functional mobility so do not expect that patient needs OT services at discharge. Will continue to see patient while in hospital to meet all goals.   Follow Up Recommendations  No OT follow up    Equipment Recommendations  Tub/shower seat    Recommendations for Other Services      Precautions / Restrictions Precautions Precautions: Fall Precaution Comments: CIWA protocol/fluid restrictions Restrictions Weight Bearing Restrictions: No       Mobility Bed Mobility Overal bed mobility: Modified Independent                Transfers Overall transfer level: Needs assistance   Transfers: Sit to/from Stand Sit to Stand: Supervision         General transfer comment: Supervision for all ambulation in room, toilet transfer and ambulation in hall without use of DME. Twice patient placed his  hand out on rail. No overt loss of balance. On return to room patient stubbed his toe on bedside table - no loss of balance.    Balance Overall balance assessment: Mild deficits observed, not formally tested                                         ADL either performed or assessed with clinical judgement   ADL   Eating/Feeding: Independent                   Lower Body Dressing: Independent Lower Body Dressing Details (indicate cue type and reason): Demonstrates ability to perform lower body dressing. Toilet Transfer: Supervision/safety;Ambulation;Regular Toilet   Toileting- Architect and Hygiene: Supervision/safety;Sit to/from stand Toileting - Clothing Manipulation Details (indicate cue type and reason): demonstrates ability to manage clothing with toileting. Repors gonig to the bathroom independently in room.             Vision Patient Visual Report: No change from baseline     Perception     Praxis      Cognition Arousal/Alertness: Awake/alert Behavior During Therapy: WFL for tasks assessed/performed Overall Cognitive Status: Within Functional Limits for tasks assessed                                          Exercises     Shoulder Instructions  General Comments      Pertinent Vitals/ Pain       Pain Assessment: No/denies pain  Home Living                                          Prior Functioning/Environment              Frequency  Min 1X/week        Progress Toward Goals  OT Goals(current goals can now be found in the care plan section)  Progress towards OT goals: Progressing toward goals  Acute Rehab OT Goals Patient Stated Goal: To go home and take care of Mother. OT Goal Formulation: With patient Time For Goal Achievement: 10/05/20  Plan Discharge plan needs to be updated    Co-evaluation                 AM-PAC OT "6 Clicks" Daily Activity      Outcome Measure   Help from another person eating meals?: None Help from another person taking care of personal grooming?: None Help from another person toileting, which includes using toliet, bedpan, or urinal?: None Help from another person bathing (including washing, rinsing, drying)?: None Help from another person to put on and taking off regular upper body clothing?: None Help from another person to put on and taking off regular lower body clothing?: None 6 Click Score: 24    End of Session    OT Visit Diagnosis: Unsteadiness on feet (R26.81);History of falling (Z91.81);Other symptoms and signs involving cognitive function;Muscle weakness (generalized) (M62.81)   Activity Tolerance Patient tolerated treatment well   Patient Left in chair;with call bell/phone within reach;with nursing/sitter in room   Nurse Communication Mobility status        Time: 1112-1120 OT Time Calculation (min): 8 min  Charges: OT General Charges $OT Visit: 1 Visit OT Treatments $Therapeutic Activity: 8-22 mins  Waldron Session, OTR/L Acute Care Rehab Services  Office [phone] Pager: [phone]    Kelli Churn 09/23/2020, 12:57 PM

## 2020-09-24 LAB — SODIUM: Sodium: 131 mmol/L — ABNORMAL LOW (ref 135–145)

## 2020-09-24 MED ORDER — ADULT MULTIVITAMIN W/MINERALS CH: 1.0000 | ORAL_TABLET | Freq: Every day | ORAL | Status: DC

## 2020-09-24 MED ORDER — ENSURE ENLIVE PO LIQD: 237.0000 mL | ORAL | 12 refills | Status: DC

## 2020-09-24 MED ORDER — FOLIC ACID 1 MG PO TABS: 1.0000 mg | ORAL_TABLET | Freq: Every day | ORAL | 1 refills | Status: DC

## 2020-09-24 MED ORDER — SODIUM CHLORIDE 1 G PO TABS: 1.0000 g | ORAL_TABLET | Freq: Two times a day (BID) | ORAL | 0 refills | Status: DC

## 2020-09-24 MED ORDER — THIAMINE HCL 100 MG PO TABS: 100.0000 mg | ORAL_TABLET | Freq: Every day | ORAL | 1 refills | Status: DC

## 2020-09-24 NOTE — Plan of Care (Signed)

## 2020-09-24 NOTE — Progress Notes (Signed)
Physical Therapy Discharge Patient Details Name: Dylan Weaver MRN: [id number] DOB: [birth year] Today's Date: 09/24/2020 Time:  -     Patient discharged from PT services secondary to goals met and no further PT needs identified.  Please see latest therapy progress note for current level of functioning and progress toward goals.    Progress and discharge plan discussed with patient and/or caregiver: Patient/Caregiver agrees with plan  Pt modified independent with mobility last session. No further skilled needs identified from last treatment note.  PT to sign off.     Hyman Crossan,KATHrine E 09/24/2020, 8:28 AM  Jannette Spanner PT, DPT Acute Rehabilitation Services Pager: [phone] Office: [phone]

## 2020-09-24 NOTE — Discharge Instructions (Signed)
Alcohol Abuse and Dependence Information, Adult Alcohol is a widely available drug. People drink alcohol in different amounts. People who drink alcohol very often and in large amounts often have problems during and after drinking. They may develop what is called an alcohol use disorder. There are two main types of alcohol use disorders:  Alcohol abuse. This is when you use alcohol too much or too often. You may use alcohol to make yourself feel happy or to reduce stress. You may have a hard time setting a limit on the amount you drink.  Alcohol dependence. This is when you use alcohol consistently for a period of time, and your body changes as a result. This can make it hard to stop drinking because you may start to feel sick or feel different when you do not use alcohol. These symptoms are known as withdrawal. How can alcohol abuse and dependence affect me? Alcohol abuse and dependence can have a negative effect on your life. Drinking too much can lead to addiction. You may feel like you need alcohol to function normally. You may drink alcohol before work in the morning, during the day, or as soon as you get home from work in the evening. These actions can result in:  Poor work performance.  Job loss.  Financial problems.  Car crashes or criminal charges from driving after drinking alcohol.  Problems in your relationships with friends and family.  Losing the trust and respect of coworkers, friends, and family. Drinking heavily over a long period of time can permanently damage your body and brain, and can cause lifelong health issues, such as:  Damage to your liver or pancreas.  Heart problems, high blood pressure, or stroke.  Certain cancers.  Decreased ability to fight infections.  Brain or nerve damage.  Depression.  Early (premature) death. If you are careless or you crave alcohol, it is easy to drink more than your body can handle (overdose). Alcohol overdose is a serious  situation that requires hospitalization. It may lead to permanent injuries or death. What can increase my risk?  Having a family history of alcohol abuse.  Having depression or other mental health conditions.  Beginning to drink at an early age.  Binge drinking often.  Experiencing trauma, stress, and an unstable home life during childhood.  Spending time with people who drink often. What actions can I take to prevent or manage alcohol abuse and dependence?  Do not drink alcohol if: ? Your health care provider tells you not to drink. ? You are pregnant, may be pregnant, or are planning to become pregnant.  If you drink alcohol: ? Limit how much you use to:  0-1 drink a day for women.  0-2 drinks a day for men. ? Be aware of how much alcohol is in your drink. In the [place] one drink equals one 12 oz bottle of beer (355 mL), one 5 oz glass of wine (148 mL), or one 1 oz glass of hard liquor (44 mL).  Stop drinking if you have been drinking too much. This can be very hard to do if you are used to abusing alcohol. If you begin to have withdrawal symptoms, talk with your health care provider or a person that you trust. These symptoms may include anxiety, shaky hands, headache, nausea, sweating, or not being able to sleep.  Choose to drink nonalcoholic beverages in social gatherings and places where there may be alcohol. Activity  Spend more time on activities that you enjoy that do   not involve alcohol, like hobbies or exercise.  Find healthy ways to cope with stress, such as exercise, meditation, or spending time with people you care about. General information  Talk to your family, coworkers, and friends about supporting you in your efforts to stop drinking. If they drink, ask them not to drink around you. Spend more time with people who do not drink alcohol.  If you think that you have an alcohol dependency problem: ? Tell friends or family about your concerns. ? Talk with your  health care provider or another health professional about where to get help. ? Work with a Paramedic and a Network engineer. ? Consider joining a support group for people who struggle with alcohol abuse and dependence. Where to find support   Your health care provider.  SMART Recovery: www.smartrecovery.org Therapy and support groups  Local treatment centers or chemical dependency counselors.  Local AA groups in your community: SalaryStart.tn Where to find more information  Centers for Disease Control and Prevention: FootballExhibition.com.br  [employer] on Alcohol Abuse and Alcoholism: BasicStudents.dk  Alcoholics Anonymous (AA): SalaryStart.tn Contact a health care provider if:  You drank more or for longer than you intended on more than one occasion.  You tried to stop drinking or to cut back on how much you drink, but you were not able to.  You often drink to the point of vomiting or passing out.  You want to drink so badly that you cannot think about anything else.  You have problems in your life due to drinking, but you continue to drink.  You keep drinking even though you feel anxious, depressed, or have experienced memory loss.  You have stopped doing the things you used to enjoy in order to drink.  You have to drink more than you used to in order to get the effect you want.  You experience anxiety, sweating, nausea, shakiness, and trouble sleeping when you try to stop drinking. Get help right away if:  You have thoughts about hurting yourself or others.  You have serious withdrawal symptoms, including: ? Confusion. ? Racing heart. ? High blood pressure. ? Fever. If you ever feel like you may hurt yourself or others, or have thoughts about taking your own life, get help right away. You can go to your nearest emergency department or call:  Your local emergency services (911 in the [place]  A suicide crisis helpline, such as the National Suicide Prevention  Lifeline at 772-332-3902. This is open 24 hours a day. Summary  Alcohol abuse and dependence can have a negative effect on your life. Drinking too much or too often can lead to addiction.  If you drink alcohol, limit how much you use.  If you are having trouble keeping your drinking under control, find ways to change your behavior. Hobbies, calming activities, exercise, or support groups can help.  If you feel you need help with changing your drinking habits, talk with your health care provider, a good friend, or a therapist, or go to an AA group. This information is not intended to replace advice given to you by your health care provider. Make sure you discuss any questions you have with your health care provider. Document Revised: 01/07/2019 Document Reviewed: 11/26/2018 Elsevier Patient Education  2020 [employer].   Alcohol Use Disorder Alcohol use disorder is when your drinking disrupts your daily life. When you have this condition, you drink too much alcohol and you cannot control your drinking. Alcohol use disorder can cause  serious problems with your physical health. It can affect your brain, heart, liver, pancreas, immune system, stomach, and intestines. Alcohol use disorder can increase your risk for certain cancers and cause problems with your mental health, such as depression, anxiety, psychosis, delirium, and dementia. People with this disorder risk hurting themselves and others. What are the causes? This condition is caused by drinking too much alcohol over time. It is not caused by drinking too much alcohol only one or two times. Some people with this condition drink alcohol to cope with or escape from negative life events. Others drink to relieve pain or symptoms of mental illness. What increases the risk? You are more likely to develop this condition if:  You have a family history of alcohol use disorder.  Your culture encourages drinking to the point of intoxication, or  makes alcohol easy to get.  You had a mood or conduct disorder in childhood.  You have been a victim of abuse.  You are an adolescent and: ? You have poor grades or difficulties in school. ? Your caregivers do not talk to you about saying no to alcohol, or supervise your activities. ? You are impulsive or you have trouble with self-control. What are the signs or symptoms? Symptoms of this condition include:  Drinkingmore than you want to.  Drinking for longer than you want to.  Trying several times to drink less or to control your drinking.  Spending a lot of time getting alcohol, drinking, or recovering from drinking.  Craving alcohol.  Having problems at work, at school, or at home due to drinking.  Having problems in relationships due to drinking.  Drinking when it is dangerous to drink, such as before driving a car.  Continuing to drink even though you know you might have a physical or mental problem related to drinking.  Needing more and more alcohol to get the same effect you want from the alcohol (building up tolerance).  Having symptoms of withdrawal when you stop drinking. Symptoms of withdrawal include: ? Fatigue. ? Nightmares. ? Trouble sleeping. ? Depression. ? Anxiety. ? Fever. ? Seizures. ? Severe confusion. ? Feeling or seeing things that are not there (hallucinations). ? Tremors. ? Rapid heart rate. ? Rapid breathing. ? High blood pressure.  Drinking to avoid symptoms of withdrawal. How is this diagnosed? This condition is diagnosed with an assessment. Your health care provider may start the assessment by asking three or four questions about your drinking. Your health care provider may perform a physical exam or do lab tests to see if you have physical problems resulting from alcohol use. She or he may refer you to a mental health professional for evaluation. How is this treated? Some people with alcohol use disorder are able to reduce their  alcohol use to low-risk levels. Others need to completely quit drinking alcohol. When necessary, mental health professionals with specialized training in substance use treatment can help. Your health care provider can help you decide how severe your alcohol use disorder is and what type of treatment you need. The following forms of treatment are available:  Detoxification. Detoxification involves quitting drinking and using prescription medicines within the first week to help lessen withdrawal symptoms. This treatment is important for people who have had withdrawal symptoms before and for heavy drinkers who are likely to have withdrawal symptoms. Alcohol withdrawal can be dangerous, and in severe cases, it can cause death. Detoxification may be provided in a home, community, or primary care setting, or in  a hospital or substance use treatment facility.  Counseling. This treatment is also called talk therapy. It is provided by substance use treatment counselors. A counselor can address the reasons you use alcohol and suggest ways to keep you from drinking again or to prevent problem drinking. The goals of talk therapy are to: ? Find healthy activities and ways for you to cope with stress. ? Identify and avoid the things that trigger your alcohol use. ? Help you learn how to handle cravings.  Medicines.Medicines can help treat alcohol use disorder by: ? Decreasing alcohol cravings. ? Decreasing the positive feeling you have when you drink alcohol. ? Causing an uncomfortable physical reaction when you drink alcohol (aversion therapy).  Support groups. Support groups are led by people who have quit drinking. They provide emotional support, advice, and guidance. These forms of treatment are often combined. Some people with this condition benefit from a combination of treatments provided by specialized substance use treatment centers. Follow these instructions at home:  Take over-the-counter and  prescription medicines only as told by your health care provider.  Check with your health care provider before starting any new medicines.  Ask friends and family members not to offer you alcohol.  Avoid situations where alcohol is served, including gatherings where others are drinking alcohol.  Create a plan for what to do when you are tempted to use alcohol.  Find hobbies or activities that you enjoy that do not include alcohol.  Keep all follow-up visits as told by your health care provider. This is important. How is this prevented?  If you drink, limit alcohol intake to no more than 1 drink a day for nonpregnant women and 2 drinks a day for men. One drink equals 12 oz of beer, 5 oz of wine, or 1 oz of hard liquor.  If you have a mental health condition, get treatment and support.  Do not give alcohol to adolescents.  If you are an adolescent: ? Do not drink alcohol. ? Do not be afraid to say no if someone offers you alcohol. Speak up about why you do not want to drink. You can be a positive role model for your friends and set a good example for those around you by not drinking alcohol. ? If your friends drink, spend time with others who do not drink alcohol. Make new friends who do not use alcohol. ? Find healthy ways to manage stress and emotions, such as meditation or deep breathing, exercise, spending time in nature, listening to music, or talking with a trusted friend or family member. Contact a health care provider if:  You are not able to take your medicines as told.  Your symptoms get worse.  You return to drinking alcohol (relapse) and your symptoms get worse. Get help right away if:  You have thoughts about hurting yourself or others. If you ever feel like you may hurt yourself or others, or have thoughts about taking your own life, get help right away. You can go to your nearest emergency department or call:  Your local emergency services (911 in the [place]  A  suicide crisis helpline, such as the National Suicide Prevention Lifeline at (934) 170-7166. This is open 24 hours a day. Summary  Alcohol use disorder is when your drinking disrupts your daily life. When you have this condition, you drink too much alcohol and you cannot control your drinking.  Treatment may include detoxification, counseling, medicine, and support groups.  Ask friends and family members not  to offer you alcohol. Avoid situations where alcohol is served.  Get help right away if you have thoughts about hurting yourself or others. This information is not intended to replace advice given to you by your health care provider. Make sure you discuss any questions you have with your health care provider. Document Revised: 08/31/2017 Document Reviewed: 06/15/2016 Elsevier Patient Education  2020 [employer].   Alcohol Abuse and Nutrition Alcohol abuse is any pattern of alcohol consumption that harms your health, relationships, or work. Alcohol abuse can cause poor nutrition (malnutrition or malnourishment) and a lack of nutrients (nutrient deficiencies), which can lead to more complications. Alcohol abuse brings malnutrition and nutrient deficiencies in two ways:  It causes your liver to work abnormally. This affects how your body divides (breaks down) and absorbs nutrients from food.  It causes you to eat poorly. Many people who abuse alcohol do not eat enough carbohydrates, protein, fat, vitamins, and minerals. Nutrients that are commonly lacking (deficient) in people who abuse alcohol include:  Vitamins. ? Vitamin A. This is needed for your vision, metabolism, and ability to fight off infections (immunity). ? B vitamins. These include folate, thiamine, and niacin. These are needed for new cell growth. ? Vitamin C. This plays an important role in wound healing, immunity, and helping your body to absorb iron. ? Vitamin D. This is necessary for your body to absorb and use  calcium. It is produced by your liver, but you can also get it from food and from sun exposure.  Minerals. ? Calcium. This is needed for healthy bones as well as heart and blood vessel (cardiovascular) function. ? Iron. This is important for blood, muscle, and nervous system functioning. ? Magnesium. This plays an important role in muscle and nerve function, and it helps to control blood sugar and blood pressure. ? Zinc. This is important for the normal functioning of your nervous system and digestive system (gastrointestinal tract). If you think that you have an alcohol dependency problem, or if it is hard to stop drinking because you feel sick or different when you do not use alcohol, talk with your health care provider or another health professional about where to get help. Nutrition is an essential factor in therapy for alcohol abuse. Your health care provider or diet and nutrition specialist (dietitian) will work with you to design a plan that can help to restore nutrients to your body and prevent the risk of complications. What is my plan? Your dietitian may develop a specific eating plan that is based on your condition and any other problems that you have. An eating plan will commonly include:  A balanced diet. ? Grains: 6-8 oz (170-227 g) a day. Examples of 1 oz of whole grains include 1 cup of whole-wheat cereal,  cup of brown rice, or 1 slice of whole-wheat bread. ? Vegetables: 2-3 cups a day. Examples of 1 cup of vegetables include 2 medium carrots, 1 large tomato, or 2 stalks of celery. ? Fruits: 1-2 cups a day. Examples of 1 cup of fruit include 1 large banana, 1 small apple, 8 large strawberries, or 1 large orange. ? Meat and other protein: 5-6 oz (142-170 g) a day.  A cut of meat or fish that is the size of a deck of cards is about 3-4 oz.  Foods that provide 1 oz of protein include 1 egg,  cup of nuts or seeds, or 1 tablespoon (16 g) of peanut butter. ? Dairy: 2-3 cups a day.  Examples of 1 cup of dairy include 8 oz (230 mL) of milk, 8 oz (230 g) of yogurt, or 1 oz (44 g) of natural cheese.  Vitamin and mineral supplements. What are tips for following this plan?  Eat frequent meals and snacks. Try to eat 5-6 small meals each day.  Take vitamin or mineral supplements as recommended by your dietitian.  If you are malnourished or if your dietitian recommends it: ? You may follow a high-protein, high-calorie diet. This may include:  2,000-3,000 calories (kilocalories) a day.  70-100 g (grams) of protein a day. ? You may be directed to follow a diet that includes a complete nutritional supplement beverage. This can help to restore calories, protein, and vitamins to your body. Depending on your condition, you may be advised to consume this beverage instead of your meals or in addition to them.  Certain medicines may cause changes in your appetite, taste, and weight. Work with your health care provider and dietitian to make any changes to your medicines and eating plan.  If you are unable to take in enough food and calories by mouth, your health care provider may recommend a feeding tube. This tube delivers nutritional supplements directly to your stomach. Recommended foods  Eat foods that are high in molecules that prevent oxygen from reacting with your food (antioxidants). These foods include grapes, berries, nuts, green tea, and dark green or orange vegetables. Eating these can help to prevent some of the stress that is placed on your liver by consuming alcohol.  Eat a variety of fresh fruits and vegetables each day. This will help you to get fiber and vitamins in your diet.  Drink plenty of water and other clear fluids, such as apple juice and broth. Try to drink at least 48-64 oz (1.5-2 L) of water a day.  Include foods fortified with vitamins and minerals in your diet. Commonly fortified foods include milk, orange juice, cereal, and bread.  Eat a variety of  foods that are high in omega-3 and omega-6 fatty acids. These include fish, nuts and seeds, and soybeans. These foods may help your liver to recover and may also stabilize your mood.  If you are a vegetarian: ? Eat a variety of protein-rich foods. ? Pair whole grains with plant-based proteins at meals and snack time. For example, eat rice with beans, put peanut butter on whole-grain toast, or eat oatmeal with sunflower seeds. The items listed above may not be a complete list of foods and beverages you can eat. Contact a dietitian for more information. Foods to avoid  Avoid foods and drinks that are high in fat and sugar. Sugary drinks, salty snacks, and candy contain empty calories. This means that they lack important nutrients such as protein, fiber, and vitamins.  Avoid alcohol. This is the best way to avoid malnutrition due to alcohol abuse. If you must drink, drink measured amounts. Measured drinking means limiting your intake to no more than 1 drink a day for nonpregnant women and 2 drinks a day for men. One drink equals 12 oz (355 mL) of beer, 5 oz (148 mL) of wine, or 1 oz (44 mL) of hard liquor.  Limit your intake of caffeine. Replace drinks like coffee and black tea with decaffeinated coffee and decaffeinated herbal tea. The items listed above may not be a complete list of foods and beverages you should avoid. Contact a dietitian for more information. Summary  Alcohol abuse can cause poor nutrition (malnutrition or malnourishment) and  a lack of nutrients (nutrient deficiencies), which can lead to more health problems.  Common nutrient deficiencies include vitamin deficiencies (A, B, C, and D) and mineral deficiencies (calcium, iron, magnesium, and zinc).  Nutrition is an essential factor in therapy for alcohol abuse.  Your health care provider and dietitian can help you to develop a specific eating plan that includes a balanced diet plus vitamin and mineral supplements. This  information is not intended to replace advice given to you by your health care provider. Make sure you discuss any questions you have with your health care provider. Document Revised: 01/07/2019 Document Reviewed: 06/05/2017 Elsevier Patient Education  2020 [employer]

## 2020-09-24 NOTE — TOC Transition Note (Signed)
Transition of Care [facility]) - CM/SW Discharge Note   Patient Details  Name: Dylan Weaver MRN: [id number] Date of Birth: 09/18/1966  [facility] CM/SW Contact:  Lanier Clam, RN Phone Number: 09/24/2020, 9:25 AM   Clinical Narrative: d/c home. No d/c needs.            Patient Goals and CMS Choice        Discharge Placement                       Discharge Plan and Services                                     Social Determinants of Health (SDOH) Interventions     Readmission Risk Interventions No flowsheet data found.

## 2020-09-24 NOTE — Progress Notes (Signed)
Pt left wrist watch in room when discharged, patient called and notified that wrist watch had been left. Pt stated, "that watch is broken, you can throw it away".   Watch thrown away per patient request.

## 2020-09-25 NOTE — Discharge Summary (Signed)
Physician Discharge Summary  Dylan Weaver ZOX:[id number] DOB: 1966-01-06 DOA: 09/14/2020  PCP: Patient, No Pcp Per  Admit date: 09/14/2020 Discharge date: 09/24/2020  Admitted From: Home. Disposition: Home   Recommendations for Outpatient Follow-up:  1. Follow up with PCP in 1-2 weeks 2. Please obtain BMP/CBC in one week   Discharge Condition: stable.  CODE STATUS: FULL CODE.  Diet recommendation: Heart Healthy   Brief/Interim Summary: 54 year old male with prior history of alcohol abuse presents to ED with withdrawal symptoms and thrombocytopenia, hyponatremia and UTI.  Patient was started on CIWA protocol. Patient's withdrawal symptoms have improved  And his sodium level improved .     Discharge Diagnoses:  Principal Problem:   Alcohol withdrawal (HCC) Active Problems:   Alcoholic hepatitis   Thrombocytopenia (HCC)   Normochromic normocytic anemia   Malnutrition of moderate degree   Alcohol withdrawal symptoms secondary to persistent alcohol abuse  Resolved with CIWA. No tremors seen. No changes in meds.      Klebsiella UTI Completed the course of antibiotics.    Elevated liver enzymes probably secondary to alcohol induced hepatitis Liver enzymes improving but not back to baseline yet. Recommend checking liver enzymes in 2 to 4 weeks.    Pancytopenia probably secondary to alcoholic hepatitis Slowly improving.     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 Worsening, pt reports that he has been drinking a lot of water since his sodium is low.  Recommend water restriction to 1200 ml per day.  Sodium between 131 to 130. Recommend checking sodium in one week.      Discharge Instructions  Discharge  Instructions    Diet - low sodium heart healthy   Complete by: As directed    Increase activity slowly   Complete by: As directed      Allergies as of 09/24/2020   No Known Allergies     Medication List    STOP taking these medications   chlordiazePOXIDE 25 MG capsule Commonly known as: LIBRIUM     TAKE these medications   aspirin EC 325 MG tablet Take 325 mg by mouth every 6 (six) hours as needed for moderate pain.   feeding supplement Liqd Take 237 mLs by mouth daily.   folic acid 1 MG tablet Commonly known as: FOLVITE Take 1 tablet (1 mg total) by mouth daily.   multivitamin with minerals Tabs tablet Take 1 tablet by mouth daily.   sodium chloride 1 g tablet Take 1 tablet (1 g total) by mouth 2 (two) times daily with a meal.   thiamine 100 MG tablet Take 1 tablet (100 mg total) by mouth daily.       No Known Allergies  Consultations:  None.    Procedures/Studies: DG Chest 2 View  Result Date: 09/14/2020 CLINICAL DATA:  54 year old male with weakness. EXAM: CHEST - 2 VIEW COMPARISON:  None FINDINGS: The heart size and mediastinal contours are within normal limits. Both lungs are clear. The visualized skeletal structures are unremarkable. IMPRESSION: No active cardiopulmonary disease. Electronically Signed   By: Elgie Collard M.D.   On: 09/14/2020 16:42   DG Pelvis 1-2 Views  Result Date: 09/14/2020 CLINICAL DATA:  Weakness. Fall. EXAM: PELVIS - 1-2 VIEW COMPARISON:  None. FINDINGS: The cortical margins of the bony pelvis are intact.  No fracture. Pubic symphysis and sacroiliac joints are congruent. Both femoral heads are well-seated in the respective acetabula. IMPRESSION: No pelvic fracture. Electronically Signed   By: Narda Rutherford M.D.   On: 09/14/2020 16:42   CT Head Wo Contrast  Result Date: 09/14/2020 CLINICAL DATA:  Fall.  Alcohol abuse. EXAM: CT HEAD WITHOUT CONTRAST TECHNIQUE: Contiguous axial images were obtained from the base of the  skull through the vertex without intravenous contrast. COMPARISON:  None. FINDINGS: Brain: No evidence of acute infarction, hemorrhage, hydrocephalus, extra-axial collection or mass lesion/mass effect. Mild generalized cerebral atrophy, advanced for age. Vascular: Atherosclerotic vascular calcification of the carotid siphons. No hyperdense vessel. Skull: Normal. Negative for fracture or focal lesion. Sinuses/Orbits: No acute finding. Other: None. IMPRESSION: 1. No acute intracranial abnormality. 2. Age advanced cerebral atrophy. Electronically Signed   By: Obie Dredge M.D.   On: 09/14/2020 17:51   CT CERVICAL SPINE WO CONTRAST  Result Date: 09/15/2020 CLINICAL DATA:  54 year old male status post fall. Found down. Tremors. Possible ETOH abuse. EXAM: CT CERVICAL SPINE WITHOUT CONTRAST TECHNIQUE: Multidetector CT imaging of the cervical spine was performed without intravenous contrast. Multiplanar CT image reconstructions were also generated. COMPARISON:  Head CT 09/14/2020. FINDINGS: Alignment: Mild reversal of cervical lordosis. Cervicothoracic junction alignment is within normal limits. Bilateral posterior element alignment is within normal limits. Skull base and vertebrae: Visualized skull base is intact. No atlanto-occipital dissociation. C1-C2 appear aligned and intact. No acute osseous abnormality identified. Advanced left TMJ degeneration, possibly posttraumatic. Soft tissues and spinal canal: No prevertebral fluid or swelling. No visible canal hematoma. Negative noncontrast visible neck soft tissues. Disc levels: Multilevel cervical disc degeneration including vacuum disc at C3-C4. Associated intermittent endplate spurring. No cervical spinal stenosis suspected. Upper chest: Chronic left clavicle deformity partially visible. Visible upper thoracic levels appear intact. Apical lung emphysema. Negative visible noncontrast thoracic inlet and superior mediastinum. Other: Negative visible posterior fossa.  IMPRESSION: 1. No acute traumatic injury identified in the cervical spine. 2. Multilevel cervical disc and endplate degeneration. 3. Upper lung emphysema suspected. Electronically Signed   By: Odessa Fleming M.D.   On: 09/15/2020 04:39    (Echo, Carotid, EGD, Colonoscopy, ERCP)    Subjective:   Discharge Exam: Vitals:   09/23/20 2112 09/24/20 0532  BP: 102/70 105/77  Pulse: 81 84  Resp: 18 18  Temp: (!) 97.5 F (36.4 C) 97.9 F (36.6 C)  SpO2: 97% 95%   Vitals:   09/23/20 0529 09/23/20 1403 09/23/20 2112 09/24/20 0532  BP: 97/66 103/80 102/70 105/77  Pulse: 75 85 81 84  Resp: 17 17 18 18   Temp: (!) 97.5 F (36.4 C) 97.7 F (36.5 C) (!) 97.5 F (36.4 C) 97.9 F (36.6 C)  TempSrc: Oral Oral    SpO2: 96% 97% 97% 95%  Weight:      Height:        General: Pt is alert, awake, not in acute distress Cardiovascular: RRR, S1/S2 +, no rubs, no gallops Respiratory: CTA bilaterally, no wheezing, no rhonchi Abdominal: Soft, NT, ND, bowel sounds + Extremities: no edema, no cyanosis    The results of significant diagnostics from this hospitalization (including imaging, microbiology, ancillary and laboratory) are listed below for reference.     Microbiology: No results found for this or any previous visit (from the past 240 hour(s)).   Labs: BNP (last 3 results) No results for input(s): BNP in the last 8760 hours. Basic Metabolic Panel: Recent Labs  Lab 09/19/20 0548 09/20/20 0456 09/21/20  0510 09/22/20 0601 09/22/20 1831 09/23/20 0919 09/23/20 1643 09/24/20 0047  NA 129* 127* 129* 126* 126* 127* 130* 131*  K 3.5 3.8 3.7 3.7  --  3.2*  --   --   CL 99 96* 100 96*  --  96*  --   --   CO2 20* 21* 20* 21*  --  20*  --   --   GLUCOSE 106* 99 94 95  --  112*  --   --   BUN 5* 5* 8 7  --  7  --   --   CREATININE 0.61 0.67 0.56* 0.55*  --  0.72  --   --   CALCIUM 8.8* 8.8* 8.9 9.0  --  8.9  --   --   MG 1.6* 1.9 1.7 1.7  --   --   --   --    Liver Function Tests: Recent  Labs  Lab 09/19/20 0548 09/20/20 0456 09/21/20 0510 09/22/20 0601 09/23/20 0919  AST 140* 131* 123* 122* 104*  ALT 53* 54* 59* 62* 62*  ALKPHOS 300* 310* 289* 304* 292*  BILITOT 4.1* 4.5* 3.5* 3.3* 2.9*  PROT 7.9 8.5* 8.2* 8.4* 8.6*  ALBUMIN 2.7* 3.2* 2.9* 3.1* 3.2*   No results for input(s): LIPASE, AMYLASE in the last 168 hours. Recent Labs  Lab 09/20/20 0824  AMMONIA 34   CBC: Recent Labs  Lab 09/20/20 0456 09/21/20 0510  WBC 5.6 6.7  HGB 11.5* 11.0*  HCT 35.4* 34.3*  MCV 92.2 94.2  PLT 201 268   Cardiac Enzymes: No results for input(s): CKTOTAL, CKMB, CKMBINDEX, TROPONINI in the last 168 hours. BNP: Invalid input(s): POCBNP CBG: No results for input(s): GLUCAP in the last 168 hours. D-Dimer No results for input(s): DDIMER in the last 72 hours. Hgb A1c No results for input(s): HGBA1C in the last 72 hours. Lipid Profile No results for input(s): CHOL, HDL, LDLCALC, TRIG, CHOLHDL, LDLDIRECT in the last 72 hours. Thyroid function studies No results for input(s): TSH, T4TOTAL, T3FREE, THYROIDAB in the last 72 hours.  Invalid input(s): FREET3 Anemia work up No results for input(s): VITAMINB12, FOLATE, FERRITIN, TIBC, IRON, RETICCTPCT in the last 72 hours. Urinalysis    Component Value Date/Time   COLORURINE AMBER (A) 09/14/2020 1607   APPEARANCEUR HAZY (A) 09/14/2020 1607   LABSPEC 1.013 09/14/2020 1607   PHURINE 8.0 09/14/2020 1607   GLUCOSEU NEGATIVE 09/14/2020 1607   HGBUR MODERATE (A) 09/14/2020 1607   BILIRUBINUR NEGATIVE 09/14/2020 1607   KETONESUR 5 (A) 09/14/2020 1607   PROTEINUR 100 (A) 09/14/2020 1607   NITRITE NEGATIVE 09/14/2020 1607   LEUKOCYTESUR MODERATE (A) 09/14/2020 1607   Sepsis Labs Invalid input(s): PROCALCITONIN,  WBC,  LACTICIDVEN Microbiology No results found for this or any previous visit (from the past 240 hour(s)).   Time coordinating discharge: 32 minutes.  SIGNED:   Kathlen Mody, MD  [facility]

## 2020-11-29 ENCOUNTER — Inpatient Hospital Stay (HOSPITAL_COMMUNITY)
Admission: EM | Admit: 2020-11-29 | Discharge: 2020-12-04 | DRG: 607 | Disposition: A | Discharge status: Home Health | Payer: No Typology Code available for payment source | Attending: Internal Medicine | Admitting: Internal Medicine

## 2020-11-29 ENCOUNTER — Emergency Department (HOSPITAL_COMMUNITY): Payer: No Typology Code available for payment source

## 2020-11-29 ENCOUNTER — Encounter (HOSPITAL_COMMUNITY): Payer: Self-pay | Admitting: *Deleted

## 2020-11-29 ENCOUNTER — Other Ambulatory Visit: Payer: Self-pay

## 2020-11-29 DIAGNOSIS — B368 Other specified superficial mycoses: Secondary | ICD-10-CM | POA: Diagnosis not present

## 2020-11-29 DIAGNOSIS — R7989 Other specified abnormal findings of blood chemistry: Secondary | ICD-10-CM | POA: Diagnosis not present

## 2020-11-29 DIAGNOSIS — F1020 Alcohol dependence, uncomplicated: Secondary | ICD-10-CM | POA: Diagnosis not present

## 2020-11-29 DIAGNOSIS — Z20822 Contact with and (suspected) exposure to covid-19: Secondary | ICD-10-CM | POA: Diagnosis present

## 2020-11-29 DIAGNOSIS — F10239 Alcohol dependence with withdrawal, unspecified: Secondary | ICD-10-CM | POA: Diagnosis present

## 2020-11-29 DIAGNOSIS — F10939 Alcohol use, unspecified with withdrawal, unspecified: Secondary | ICD-10-CM

## 2020-11-29 DIAGNOSIS — E861 Hypovolemia: Secondary | ICD-10-CM | POA: Diagnosis present

## 2020-11-29 DIAGNOSIS — D696 Thrombocytopenia, unspecified: Secondary | ICD-10-CM | POA: Diagnosis present

## 2020-11-29 DIAGNOSIS — R9431 Abnormal electrocardiogram [ECG] [EKG]: Secondary | ICD-10-CM | POA: Diagnosis present

## 2020-11-29 DIAGNOSIS — L304 Erythema intertrigo: Secondary | ICD-10-CM | POA: Diagnosis not present

## 2020-11-29 DIAGNOSIS — E876 Hypokalemia: Secondary | ICD-10-CM | POA: Diagnosis present

## 2020-11-29 DIAGNOSIS — Z7982 Long term (current) use of aspirin: Secondary | ICD-10-CM

## 2020-11-29 DIAGNOSIS — F17200 Nicotine dependence, unspecified, uncomplicated: Secondary | ICD-10-CM | POA: Diagnosis present

## 2020-11-29 DIAGNOSIS — K760 Fatty (change of) liver, not elsewhere classified: Secondary | ICD-10-CM | POA: Diagnosis present

## 2020-11-29 DIAGNOSIS — Z79899 Other long term (current) drug therapy: Secondary | ICD-10-CM

## 2020-11-29 DIAGNOSIS — D72829 Elevated white blood cell count, unspecified: Secondary | ICD-10-CM | POA: Diagnosis present

## 2020-11-29 DIAGNOSIS — E871 Hypo-osmolality and hyponatremia: Secondary | ICD-10-CM | POA: Diagnosis present

## 2020-11-29 DIAGNOSIS — D649 Anemia, unspecified: Secondary | ICD-10-CM | POA: Diagnosis present

## 2020-11-29 DIAGNOSIS — R6510 Systemic inflammatory response syndrome (SIRS) of non-infectious origin without acute organ dysfunction: Secondary | ICD-10-CM | POA: Diagnosis present

## 2020-11-29 HISTORY — DX: Alcohol abuse, uncomplicated: F10.10

## 2020-11-29 LAB — COMPREHENSIVE METABOLIC PANEL
ALT: 69 U/L — ABNORMAL HIGH (ref 0–44)
AST: 167 U/L — ABNORMAL HIGH (ref 15–41)
Albumin: 3.4 g/dL — ABNORMAL LOW (ref 3.5–5.0)
Alkaline Phosphatase: 318 U/L — ABNORMAL HIGH (ref 38–126)
Anion gap: 14 (ref 5–15)
BUN: 17 mg/dL (ref 6–20)
CO2: 19 mmol/L — ABNORMAL LOW (ref 22–32)
Calcium: 9.1 mg/dL (ref 8.9–10.3)
Chloride: 98 mmol/L (ref 98–111)
Creatinine, Ser: 0.81 mg/dL (ref 0.61–1.24)
GFR, Estimated: >60 mL/min (ref >60)
Glucose, Bld: 114 mg/dL — ABNORMAL HIGH (ref 70–99)
Potassium: 4.3 mmol/L (ref 3.5–5.1)
Sodium: 131 mmol/L — ABNORMAL LOW (ref 135–145)
Total Bilirubin: 3.4 mg/dL — ABNORMAL HIGH (ref 0.3–1.2)
Total Protein: 10.2 g/dL — ABNORMAL HIGH (ref 6.5–8.1)

## 2020-11-29 LAB — CBC WITH DIFFERENTIAL/PLATELET
Abs Immature Granulocytes: 0.03 10*3/uL (ref 0.00–0.07)
Basophils Absolute: 0.1 10*3/uL (ref 0.0–0.1)
Basophils Relative: 0 %
Eosinophils Absolute: 0 10*3/uL (ref 0.0–0.5)
Eosinophils Relative: 0 %
HCT: 39.3 % (ref 39.0–52.0)
Hemoglobin: 12.9 g/dL — ABNORMAL LOW (ref 13.0–17.0)
Immature Granulocytes: 0 %
Lymphocytes Relative: 10 %
Lymphs Abs: 1.1 10*3/uL (ref 0.7–4.0)
MCH: 29.9 pg (ref 26.0–34.0)
MCHC: 32.8 g/dL (ref 30.0–36.0)
MCV: 91.2 fL (ref 80.0–100.0)
Monocytes Absolute: 0.7 10*3/uL (ref 0.1–1.0)
Monocytes Relative: 7 %
Neutro Abs: 9.2 10*3/uL — ABNORMAL HIGH (ref 1.7–7.7)
Neutrophils Relative %: 83 %
Platelets: 123 10*3/uL — ABNORMAL LOW (ref 150–400)
RBC: 4.31 MIL/uL (ref 4.22–5.81)
RDW: 16.3 % — ABNORMAL HIGH (ref 11.5–15.5)
WBC: 11.2 10*3/uL — ABNORMAL HIGH (ref 4.0–10.5)
nRBC: 0 % (ref 0.0–0.2)

## 2020-11-29 LAB — ACETAMINOPHEN LEVEL: Acetaminophen (Tylenol), Serum: <10 ug/mL — ABNORMAL LOW (ref 10–30)

## 2020-11-29 LAB — URINALYSIS, ROUTINE W REFLEX MICROSCOPIC
Bacteria, UA: NONE SEEN
Glucose, UA: 150 mg/dL — AB
Ketones, ur: 20 mg/dL — AB
Leukocytes,Ua: NEGATIVE
Nitrite: NEGATIVE
Protein, ur: 100 mg/dL — AB
Specific Gravity, Urine: 1.029 (ref 1.005–1.030)
pH: 5 (ref 5.0–8.0)

## 2020-11-29 LAB — RESP PANEL BY RT-PCR (FLU A&B, COVID) ARPGX2
Influenza A by PCR: NEGATIVE
Influenza B by PCR: NEGATIVE
SARS Coronavirus 2 by RT PCR: NEGATIVE

## 2020-11-29 LAB — CK: Total CK: 82 U/L (ref 49–397)

## 2020-11-29 LAB — SALICYLATE LEVEL: Salicylate Lvl: <7 mg/dL — ABNORMAL LOW (ref 7.0–30.0)

## 2020-11-29 LAB — PROTIME-INR
INR: 1 (ref 0.8–1.2)
Prothrombin Time: 12.5 seconds (ref 11.4–15.2)

## 2020-11-29 LAB — LACTIC ACID, PLASMA: Lactic Acid, Venous: 1.8 mmol/L (ref 0.5–1.9)

## 2020-11-29 LAB — ETHANOL: Alcohol, Ethyl (B): <10 mg/dL (ref <10)

## 2020-11-29 LAB — MAGNESIUM: Magnesium: 1.8 mg/dL (ref 1.7–2.4)

## 2020-11-29 MED ORDER — THIAMINE HCL 100 MG/ML IJ SOLN
100.0000 mg | Freq: Every day | INTRAMUSCULAR | Status: DC
  Filled 2020-11-29: qty 2

## 2020-11-29 MED ORDER — LORAZEPAM 1 MG PO TABS: 1.0000 mg | ORAL_TABLET | ORAL | Status: AC | PRN

## 2020-11-29 MED ORDER — LORAZEPAM 2 MG/ML IJ SOLN: 0.0000 mg | Freq: Two times a day (BID) | INTRAMUSCULAR | Status: AC

## 2020-11-29 MED ORDER — ACETAMINOPHEN 325 MG PO TABS
325.0000 mg | ORAL_TABLET | Freq: Four times a day (QID) | ORAL | Status: DC | PRN
  Administered 2020-11-30: 325 mg via ORAL
  Filled 2020-11-29: qty 1

## 2020-11-29 MED ORDER — SODIUM CHLORIDE 0.9 % IV SOLN
2.0000 g | Freq: Once | INTRAVENOUS | Status: AC
  Administered 2020-11-29: 2 g via INTRAVENOUS
  Filled 2020-11-29: qty 2

## 2020-11-29 MED ORDER — MAGNESIUM SULFATE IN D5W 1-5 GM/100ML-% IV SOLN
1.0000 g | Freq: Once | INTRAVENOUS | Status: AC
  Administered 2020-11-29: 1 g via INTRAVENOUS
  Filled 2020-11-29: qty 100

## 2020-11-29 MED ORDER — GERHARDT'S BUTT CREAM
TOPICAL_CREAM | Freq: Two times a day (BID) | CUTANEOUS | Status: DC
  Administered 2020-11-30 (×2): 1 via TOPICAL
  Filled 2020-11-29 (×2): qty 1

## 2020-11-29 MED ORDER — SODIUM CHLORIDE 0.9 % IV BOLUS
1000.0000 mL | Freq: Once | INTRAVENOUS | Status: AC
  Administered 2020-11-29: 1000 mL via INTRAVENOUS

## 2020-11-29 MED ORDER — LORAZEPAM 2 MG/ML IJ SOLN: 1.0000 mg | INTRAMUSCULAR | Status: AC | PRN

## 2020-11-29 MED ORDER — ACETAMINOPHEN 650 MG RE SUPP: 325.0000 mg | Freq: Four times a day (QID) | RECTAL | Status: DC | PRN

## 2020-11-29 MED ORDER — ADULT MULTIVITAMIN W/MINERALS CH
1.0000 | ORAL_TABLET | Freq: Every day | ORAL | Status: DC
  Administered 2020-11-30: 1 via ORAL
  Filled 2020-11-29: qty 1

## 2020-11-29 MED ORDER — FOLIC ACID 1 MG PO TABS
1.0000 mg | ORAL_TABLET | Freq: Every day | ORAL | Status: DC
  Administered 2020-11-30 – 2020-12-04 (×5): 1 mg via ORAL
  Filled 2020-11-29 (×5): qty 1

## 2020-11-29 MED ORDER — SODIUM CHLORIDE 0.9 % IV SOLN
250.0000 mL | INTRAVENOUS | Status: DC | PRN
  Administered 2020-12-01: 250 mL via INTRAVENOUS

## 2020-11-29 MED ORDER — ACETAMINOPHEN 325 MG PO TABS
650.0000 mg | ORAL_TABLET | Freq: Once | ORAL | Status: AC
  Administered 2020-11-29: 650 mg via ORAL
  Filled 2020-11-29: qty 2

## 2020-11-29 MED ORDER — VANCOMYCIN HCL IN DEXTROSE 1-5 GM/200ML-% IV SOLN
1000.0000 mg | Freq: Once | INTRAVENOUS | Status: AC
  Administered 2020-11-29: 1000 mg via INTRAVENOUS
  Filled 2020-11-29: qty 200

## 2020-11-29 MED ORDER — SODIUM CHLORIDE 0.9 % IV BOLUS (SEPSIS)
1000.0000 mL | Freq: Once | INTRAVENOUS | Status: AC
  Administered 2020-11-29: 1000 mL via INTRAVENOUS

## 2020-11-29 MED ORDER — ENOXAPARIN SODIUM 40 MG/0.4ML ~~LOC~~ SOLN
40.0000 mg | SUBCUTANEOUS | Status: DC
  Administered 2020-11-30 – 2020-12-04 (×5): 40 mg via SUBCUTANEOUS
  Filled 2020-11-29 (×5): qty 0.4

## 2020-11-29 MED ORDER — LORAZEPAM 2 MG/ML IJ SOLN
1.0000 mg | Freq: Once | INTRAMUSCULAR | Status: AC
  Administered 2020-11-29: 1 mg via INTRAVENOUS
  Filled 2020-11-29: qty 1

## 2020-11-29 MED ORDER — SENNOSIDES-DOCUSATE SODIUM 8.6-50 MG PO TABS: 1.0000 | ORAL_TABLET | Freq: Every evening | ORAL | Status: DC | PRN

## 2020-11-29 MED ORDER — KETOCONAZOLE 2 % EX CREA
TOPICAL_CREAM | Freq: Two times a day (BID) | CUTANEOUS | Status: DC
  Administered 2020-11-30 – 2020-12-03 (×2): 1 via TOPICAL
  Filled 2020-11-29 (×2): qty 15

## 2020-11-29 MED ORDER — THIAMINE HCL 100 MG PO TABS
100.0000 mg | ORAL_TABLET | Freq: Every day | ORAL | Status: DC
  Administered 2020-11-30 – 2020-12-04 (×5): 100 mg via ORAL
  Filled 2020-11-29 (×5): qty 1

## 2020-11-29 MED ORDER — LORAZEPAM 2 MG/ML IJ SOLN
0.0000 mg | Freq: Four times a day (QID) | INTRAMUSCULAR | Status: AC
  Administered 2020-11-29: 1 mg via INTRAVENOUS
  Administered 2020-11-30 – 2020-12-01 (×4): 2 mg via INTRAVENOUS
  Filled 2020-11-29 (×5): qty 1

## 2020-11-29 NOTE — ED Notes (Signed)
Gave pt a full bath applied barrier cream on genitals and bottom

## 2020-11-29 NOTE — ED Notes (Addendum)
Pt denies daily ETOH consumption, but reports going on binges for 5 - 6 days where he consumes a 1/2 gallon of Christiane Ha and a 12-pack of beer per day. Last drink was yesterday.

## 2020-11-29 NOTE — ED Provider Notes (Signed)
[place] DEPT Provider Note   CSN: [id number] Arrival date & time: 11/29/20  1640     History Chief Complaint  Patient presents with  . Genital skin breakdown    Dylan Weaver is a 55 y.o. male.  Presented to ER with concern for myriad symptoms including generalized fatigue, weakness, depression.  Patient reports that he has been going downhill overall for the last few months since his wife left.  Has frequent binge drinking.  Last drink was yesterday.  Does a combination of beer, wine and [place]aron Stalls.  No thoughts of hurting himself or hurting others.  Over the past month or so he has noted wound and skin breakdown around his genitals and buttocks.  Denies having fever, no cough, chest pain or difficulty in breathing.  No nausea or vomiting.  No abdominal pain.  No known Covid contacts.  History of alcohol withdrawal, alcohol abuse, malnutrition.  HPI     Past Medical History:  Diagnosis Date  . ETOH abuse     Patient Active Problem List   Diagnosis Date Noted  . Malnutrition of moderate degree 09/16/2020  . Alcohol withdrawal ([place]) 09/14/2020  . Alcoholic hepatitis 98/92/1194  . Thrombocytopenia ([place]) 09/14/2020  . Normochromic normocytic anemia 09/14/2020  . Alcohol use disorder, severe, dependence ([place]) 06/19/2020  . Alcohol abuse with intoxication ([place]) 06/19/2020    Past Surgical History:  Procedure Laterality Date  . NASAL SEPTUM SURGERY         Family History  Family history unknown: Yes    Social History   Tobacco Use  . Smoking status: Current Every Day Smoker  . Smokeless tobacco: Never Used  Substance Use Topics  . Alcohol use: Yes  . Drug use: Never    Home Medications Prior to Admission medications   Medication Sig Start Date End Date Taking? Authorizing Provider  aspirin EC 325 MG tablet Take 325 mg by mouth every 6 (six) hours as needed for moderate pain.    [provider]  feeding  supplement (ENSURE ENLIVE / ENSURE PLUS) LIQD Take 237 mLs by mouth daily. 09/24/20   Hosie Poisson, MD  folic acid (FOLVITE) 1 MG tablet Take 1 tablet (1 mg total) by mouth daily. 09/24/20   Hosie Poisson, MD  Multiple Vitamin (MULTIVITAMIN WITH MINERALS) TABS tablet Take 1 tablet by mouth daily. 09/24/20   Hosie Poisson, MD  sodium chloride 1 g tablet Take 1 tablet (1 g total) by mouth 2 (two) times daily with a meal. 09/24/20   Hosie Poisson, MD  thiamine 100 MG tablet Take 1 tablet (100 mg total) by mouth daily. 09/24/20   Hosie Poisson, MD    Allergies    Patient has no known allergies.  Review of Systems   Review of Systems  Constitutional: Positive for chills, fatigue and fever.  HENT: Negative for ear pain and sore throat.   Eyes: Negative for pain and visual disturbance.  Respiratory: Negative for cough and shortness of breath.   Cardiovascular: Negative for chest pain and palpitations.  Gastrointestinal: Negative for abdominal pain and vomiting.  Genitourinary: Negative for dysuria and hematuria.  Musculoskeletal: Negative for arthralgias and back pain.  Skin: Positive for rash and wound. Negative for color change.  Neurological: Positive for tremors. Negative for seizures and syncope.  All other systems reviewed and are negative.    Physical Exam Updated Vital Signs BP 114/74   Pulse 95   Temp (!) 101.1 F (38.4 C) (Rectal)  Resp (!) 22   SpO2 97%   Physical Exam Vitals and nursing note reviewed.  Constitutional:      Appearance: He is well-developed and well-nourished.     Comments: Chronically ill appearing but in no distress  HENT:     Head: Normocephalic and atraumatic.  Eyes:     Conjunctiva/sclera: Conjunctivae normal.  Cardiovascular:     Rate and Rhythm: Regular rhythm. Tachycardia present.     Heart sounds: No murmur heard.   Pulmonary:     Effort: Pulmonary effort is normal. No respiratory distress.     Breath sounds: Normal breath sounds.   Abdominal:     Palpations: Abdomen is soft.     Tenderness: There is no abdominal tenderness.  Genitourinary:    Comments: Decubitus ulcer across scrotum, perineum and buttocks; no underlying crepitus or TTP, no induration or fluctuance Musculoskeletal:        General: No deformity, signs of injury or edema.     Cervical back: Neck supple.  Skin:    General: Skin is warm and dry.  Neurological:     General: No focal deficit present.     Mental Status: He is alert and oriented to person, place, and time.  Psychiatric:        Mood and Affect: Mood and affect normal.     Comments: Somewhat anxious but no SI or HI     ED Results / Procedures / Treatments   Labs (all labs ordered are listed, but only abnormal results are displayed) Labs Reviewed  CBC WITH DIFFERENTIAL/PLATELET - Abnormal; Notable for the following components:      Result Value   WBC 11.2 (*)    Hemoglobin 12.9 (*)    RDW 16.3 (*)    Platelets 123 (*)    Neutro Abs 9.2 (*)    All other components within normal limits  COMPREHENSIVE METABOLIC PANEL - Abnormal; Notable for the following components:   Sodium 131 (*)    CO2 19 (*)    Glucose, Bld 114 (*)    Total Protein 10.2 (*)    Albumin 3.4 (*)    AST 167 (*)    ALT 69 (*)    Alkaline Phosphatase 318 (*)    Total Bilirubin 3.4 (*)    All other components within normal limits  URINALYSIS, ROUTINE W REFLEX MICROSCOPIC - Abnormal; Notable for the following components:   Color, Urine AMBER (*)    Glucose, UA 150 (*)    Hgb urine dipstick SMALL (*)    Bilirubin Urine SMALL (*)    Ketones, ur 20 (*)    Protein, ur 100 (*)    All other components within normal limits  ACETAMINOPHEN LEVEL - Abnormal; Notable for the following components:   Acetaminophen (Tylenol), Serum <10 (*)    All other components within normal limits  SALICYLATE LEVEL - Abnormal; Notable for the following components:   Salicylate Lvl <7.7 (*)    All other components within normal  limits  RESP PANEL BY RT-PCR (FLU A&B, COVID) ARPGX2  CULTURE, BLOOD (ROUTINE X 2)  CULTURE, BLOOD (ROUTINE X 2)  URINE CULTURE  CK  LACTIC ACID, PLASMA  MAGNESIUM  PROTIME-INR    EKG EKG Interpretation  Date/Time:  Monday November 29 2020 18:05:33 EST Ventricular Rate:  118 PR Interval:    QRS Duration: 90 QT Interval:  408 QTC Calculation: 572 R Axis:   74 Text Interpretation: Sinus tachycardia Borderline repolarization abnormality Prolonged QT interval Confirmed by  Madalyn Rob ([id number]) on 11/29/2020 6:19:25 PM   Radiology US Abdomen Limited  Result Date: 11/29/2020 CLINICAL DATA:  Right upper quadrant pain, transaminitis, fever EXAM: ULTRASOUND ABDOMEN LIMITED RIGHT UPPER QUADRANT COMPARISON:  None. FINDINGS: Gallbladder: No gallstones or wall thickening visualized. No sonographic Murphy sign noted by sonographer. Common bile duct: Diameter: 4 mm Liver: Liver demonstrates diffuse increased echotexture, consistent with hepatic steatosis. No focal abnormality or biliary dilation. Portal vein is patent on color Doppler imaging with normal direction of blood flow towards the liver. Other: None. IMPRESSION: 1. Increased liver echotexture consistent with hepatic steatosis. Electronically Signed   By: Randa Ngo M.D.   On: 11/29/2020 20:45   DG Chest Portable 1 View  Result Date: 11/29/2020 CLINICAL DATA:  Tachycardia and shortness of breath EXAM: PORTABLE CHEST 1 VIEW COMPARISON:  09/15/2019 FINDINGS: The heart size and mediastinal contours are within normal limits. Both lungs are clear. The visualized skeletal structures are unremarkable. IMPRESSION: No active disease. Electronically Signed   By: Inez Catalina M.D.   On: 11/29/2020 18:12    Procedures Procedures   Medications Ordered in ED Medications  LORazepam (ATIVAN) injection 1 mg (1 mg Intravenous Given 11/29/20 1820)  sodium chloride 0.9 % bolus 1,000 mL (0 mLs Intravenous Stopped 11/29/20 2112)  ceFEPIme (MAXIPIME)  2 g in sodium chloride 0.9 % 100 mL IVPB (0 g Intravenous Stopped 11/29/20 1931)  vancomycin (VANCOCIN) IVPB 1000 mg/200 mL premix (0 mg Intravenous Stopped 11/29/20 2112)  sodium chloride 0.9 % bolus 1,000 mL (0 mLs Intravenous Stopped 11/29/20 2113)  acetaminophen (TYLENOL) tablet 650 mg (650 mg Oral Given 11/29/20 1902)  magnesium sulfate IVPB 1 g 100 mL (0 g Intravenous Stopped 11/29/20 2113)    ED Course  I have reviewed the triage vital signs and the nursing notes.  Pertinent labs & imaging results that were available during my care of the patient were reviewed by me and considered in my medical decision making (see chart for details).  Clinical Course as of 11/29/20 2213  Mon Nov 29, 2020  1827 Temp(!): 101.1 F (38.4 C) Pt found to be febrile - given tachycardia, will start broad spectrum abx, additional fluids, monitor closely [RD]    Clinical Course User Index [RD] Lucrezia Starch, MD   MDM Rules/Calculators/A&P                         55 year old male with history of alcohol abuse, malnutrition presents to ER with concern for genital skin breakdown, generalized weakness.  On exam, patient was found to be tachycardic, febrile.  Noted appears to be chronic skin breakdown around his genitals and buttocks.  Suspect related to poor hygiene.  Does not appear superinfected at present.  Given the initial vitals, started broad work-up and broad-spectrum antibiotics and fluids.  UA negative for infection.  CXR negative.  Labs noted to have transaminitis.  Right upper quadrant ultrasound negative for acute process.  Also concern for patient going into alcohol withdrawal, provided ativan.   Given initial SIRS criteria, concern for alcohol withdrawal, elevated LFTs, believe patient would benefit from admission for further observation, following cultures, trending LFTs.  Final Clinical Impression(s) / ED Diagnoses Final diagnoses:  Alcohol withdrawal syndrome with complication (HCC)  SIRS  (systemic inflammatory response syndrome) ([place])    Rx / DC Orders ED Discharge Orders    None       Lucrezia Starch, MD 11/29/20 2214

## 2020-11-29 NOTE — Progress Notes (Signed)
A consult was received from an ED physician for vanc/cefepime per pharmacy dosing.  The patient's profile has been reviewed for ht/wt/allergies/indication/available labs.   A one time order has been placed for cefepime 2g and vanc 1g.  Further antibiotics/pharmacy consults should be ordered by admitting physician if indicated.                       Thank you, Berkley Harvey 11/29/2020  6:38 PM

## 2020-11-29 NOTE — H&P (Signed)
History and Physical    Dylan Weaver UYQ:[id number] DOB: March 06, 1966 DOA: 11/29/2020  PCP: Dylan Weaver   Patient coming from: Home   Chief Complaint: Skin breakdown in groin, alcohol problem   HPI: Dylan Weaver is a 55 y.o. male with medical history significant for alcohol dependence, chronic hyponatremia, and chronic elevation in LFTs, now presenting to the emergency department complaining of skin breakdown in the groin, ongoing alcohol problem, and general malaise.  Patient reports that he has had a rash in his groin for at least a month now that has been tender and red.  Reports that he continues to have frequent alcohol binges, last was yesterday.  He reports feeling down since his wife left but denies any SI, HI, or hallucinations.  He denies any abdominal pain, nausea, vomiting, or diarrhea.  He denies chest pain, cough, or shortness of breath.  He denies dysuria or flank pain.  Denies neck stiffness or headache.  He has not noticed any fevers.  ED Course: Upon arrival to the ED, patient is found to be febrile to 38.4 C, saturating mid 90s on room air, tachypneic and tachycardic initially, and with stable blood pressure.  EKG features sinus tachycardia with rate 118 and QTc interval of 572 ms.  Chest x-rays negative for acute cardiopulmonary disease.  Abdominal ultrasound notable for increased liver echotexture consistent with hepatic steatosis.  Chemistry panel features a sodium 131, alkaline phosphatase 318, AST 167, ALT 69, and total bilirubin 3.4.  CBC with mild leukocytosis and thrombocytopenia.  Lactic acid and INR are normal, acetaminophen and salicylates undetectable, and COVID-19 PCR negative.  Blood and urine culture were collected in the emergency department and the patient was treated with acetaminophen, Ativan, vancomycin, cefepime, and IV fluids.  Review of Systems:  All other systems reviewed and apart from HPI, are negative.  Past Medical History:   Diagnosis Date  . ETOH abuse     Past Surgical History:  Procedure Laterality Date  . NASAL SEPTUM SURGERY      Social History:   reports that he has been smoking. He has never used smokeless tobacco. He reports current alcohol use. He reports that he does not use drugs.  No Known Allergies  Family History  Family history unknown: Yes     Prior to Admission medications   Medication Sig Start Date End Date Taking? Authorizing Provider  aspirin EC 325 MG tablet Take 325 mg by mouth every 6 (six) hours as needed for moderate pain.    [provider]  feeding supplement (ENSURE ENLIVE / ENSURE PLUS) LIQD Take 237 mLs by mouth daily. 09/24/20   Hosie Poisson, MD  folic acid (FOLVITE) 1 MG tablet Take 1 tablet (1 mg total) by mouth daily. 09/24/20   Hosie Poisson, MD  Multiple Vitamin (MULTIVITAMIN WITH MINERALS) TABS tablet Take 1 tablet by mouth daily. 09/24/20   Hosie Poisson, MD  sodium chloride 1 g tablet Take 1 tablet (1 g total) by mouth 2 (two) times daily with a meal. 09/24/20   Hosie Poisson, MD  thiamine 100 MG tablet Take 1 tablet (100 mg total) by mouth daily. 09/24/20   Hosie Poisson, MD    Physical Exam: Vitals:   11/29/20 2130 11/29/20 2200 11/29/20 2209 11/29/20 2230  BP: 131/85 114/74 114/74 109/76  Pulse: 92 84 95 81  Resp: 20 (!) 21 (!) 22 (!) 21  Temp:      TempSrc:      SpO2: 90% 94%  97% 95%    Constitutional: NAD, calm  Eyes: PERTLA, lids and conjunctivae normal ENMT: Mucous membranes are moist. Posterior pharynx clear of any exudate or lesions.   Neck: normal, supple, no masses, no thyromegaly Respiratory: no wheezing, no crackles. No accessory muscle use.  Cardiovascular: S1 & S2 heard, regular rate and rhythm. No extremity edema.   Abdomen: No distension, no tenderness, soft. Bowel sounds active.  Musculoskeletal: no clubbing / cyanosis. No joint deformity upper and lower extremities.   Skin: Erythema and maceration in inguinal folds.  Warm, dry, well-perfused. Neurologic: No gross facial asymmetry, gross hearing deficit. Sensation intact. Moving all extremities.  Psychiatric: Sleeping, wakes to voice and oriented to person, place, and situation. Calm and cooperative.    Labs and Imaging on Admission: I have personally reviewed following labs and imaging studies  CBC: Recent Labs  Lab 11/29/20 1845  WBC 11.2*  NEUTROABS 9.2*  HGB 12.9*  HCT 39.3  MCV 91.2  PLT 469*   Basic Metabolic Panel: Recent Labs  Lab 11/29/20 1845  NA 131*  K 4.3  CL 98  CO2 19*  GLUCOSE 114*  BUN 17  CREATININE 0.81  CALCIUM 9.1  MG 1.8   GFR: CrCl cannot be calculated (Unknown ideal weight.). Liver Function Tests: Recent Labs  Lab 11/29/20 1845  AST 167*  ALT 69*  ALKPHOS 318*  BILITOT 3.4*  PROT 10.2*  ALBUMIN 3.4*   No results for input(s): LIPASE, AMYLASE in the last 168 hours. No results for input(s): AMMONIA in the last 168 hours. Coagulation Profile: Recent Labs  Lab 11/29/20 1845  INR 1.0   Cardiac Enzymes: Recent Labs  Lab 11/29/20 1845  CKTOTAL 82   BNP (last 3 results) No results for input(s): PROBNP in the last 8760 hours. HbA1C: No results for input(s): HGBA1C in the last 72 hours. CBG: No results for input(s): GLUCAP in the last 168 hours. Lipid Profile: No results for input(s): CHOL, HDL, LDLCALC, TRIG, CHOLHDL, LDLDIRECT in the last 72 hours. Thyroid Function Tests: No results for input(s): TSH, T4TOTAL, FREET4, T3FREE, THYROIDAB in the last 72 hours. Anemia Panel: No results for input(s): VITAMINB12, FOLATE, FERRITIN, TIBC, IRON, RETICCTPCT in the last 72 hours. Urine analysis:    Component Value Date/Time   COLORURINE AMBER (A) 11/29/2020 2113   APPEARANCEUR CLEAR 11/29/2020 2113   LABSPEC 1.029 11/29/2020 2113   PHURINE 5.0 11/29/2020 2113   GLUCOSEU 150 (A) 11/29/2020 2113   HGBUR SMALL (A) 11/29/2020 2113   BILIRUBINUR SMALL (A) 11/29/2020 2113   KETONESUR 20 (A) 11/29/2020  2113   PROTEINUR 100 (A) 11/29/2020 2113   NITRITE NEGATIVE 11/29/2020 2113   LEUKOCYTESUR NEGATIVE 11/29/2020 2113   Sepsis Labs: _0 (procalcitonin:4,lacticidven:4) ) Recent Results (from the past 240 hour(s))  Resp Panel by RT-PCR (Flu A&B, Covid) Nasopharyngeal Swab     Status: None   Collection Time: 11/29/20  6:27 PM   Specimen: Nasopharyngeal Swab; Nasopharyngeal(NP) swabs in vial transport medium  Result Value Ref Range Status   SARS Coronavirus 2 by RT PCR NEGATIVE NEGATIVE Final    Comment: (NOTE) SARS-CoV-2 target nucleic acids are NOT DETECTED.  The SARS-CoV-2 RNA is generally detectable in upper respiratory specimens during the acute phase of infection. The lowest concentration of SARS-CoV-2 viral copies this assay can detect is 138 copies/mL. A negative result does not preclude SARS-Cov-2 infection and should not be used as the sole basis for treatment or other patient management decisions. A negative result may occur with  improper  specimen collection/handling, submission of specimen other than nasopharyngeal swab, presence of viral mutation(s) within the areas targeted by this assay, and inadequate number of viral copies(<138 copies/mL). A negative result must be combined with clinical observations, patient history, and epidemiological information. The expected result is Negative.  Fact Sheet for Patients:  EntrepreneurPulse.com.au  Fact Sheet for Healthcare Providers:  IncredibleEmployment.be  This test is no t yet approved or cleared by the [place] FDA and  has been authorized for detection and/or diagnosis of SARS-CoV-2 by FDA under an Emergency Use Authorization (EUA). This EUA will remain  in effect (meaning this test can be used) for the duration of the COVID-19 declaration under Section 564(b)(1) of the Act, 21 U.S.C.section 360bbb-3(b)(1), unless the authorization is terminated  or revoked sooner.        Influenza A by PCR NEGATIVE NEGATIVE Final   Influenza B by PCR NEGATIVE NEGATIVE Final    Comment: (NOTE) The Xpert Xpress SARS-CoV-2/FLU/RSV plus assay is intended as an aid in the diagnosis of influenza from Nasopharyngeal swab specimens and should not be used as a sole basis for treatment. Nasal washings and aspirates are unacceptable for Xpert Xpress SARS-CoV-2/FLU/RSV testing.  Fact Sheet for Patients: EntrepreneurPulse.com.au  Fact Sheet for Healthcare Providers: IncredibleEmployment.be  This test is not yet approved or cleared by the [place] FDA and has been authorized for detection and/or diagnosis of SARS-CoV-2 by FDA under an Emergency Use Authorization (EUA). This EUA will remain in effect (meaning this test can be used) for the duration of the COVID-19 declaration under Section 564(b)(1) of the Act, 21 U.S.C. section 360bbb-3(b)(1), unless the authorization is terminated or revoked.  Performed at [facility], [place] [address], [place], [place] [address]      Radiological Exams on Admission: US Abdomen Limited  Result Date: 11/29/2020 CLINICAL DATA:  Right upper quadrant pain, transaminitis, fever EXAM: ULTRASOUND ABDOMEN LIMITED RIGHT UPPER QUADRANT COMPARISON:  None. FINDINGS: Gallbladder: No gallstones or wall thickening visualized. No sonographic Murphy sign noted by sonographer. Common bile duct: Diameter: 4 mm Liver: Liver demonstrates diffuse increased echotexture, consistent with hepatic steatosis. No focal abnormality or biliary dilation. Portal vein is patent on color Doppler imaging with normal direction of blood flow towards the liver. Other: None. IMPRESSION: 1. Increased liver echotexture consistent with hepatic steatosis. Electronically Signed   By: Randa Ngo M.D.   On: 11/29/2020 20:45   DG Chest Portable 1 View  Result Date: 11/29/2020 CLINICAL DATA:  Tachycardia and shortness of breath  EXAM: PORTABLE CHEST 1 VIEW COMPARISON:  09/15/2019 FINDINGS: The heart size and mediastinal contours are within normal limits. Both lungs are clear. The visualized skeletal structures are unremarkable. IMPRESSION: No active disease. Electronically Signed   By: Inez Catalina M.D.   On: 11/29/2020 18:12    EKG: Independently reviewed. Sinus tachycardia, rate 118, QTc 572.   Assessment/Plan   1. SIRS  - Presents with skin breakdown in groin and is found to have fever and tachycardia with negative COVID, clear CXR, no urinary sxs or meningismus   - Alcoholic hepatitis a consideration but there is no RUQ tenderness  - Groin rash appears to be superficial fungal infection but CT pelvis is pending  - Blood cultures were collected in ED and he was given a dose of broad-spectrum antibiotics  - Follow-up CT findings, check procalcitonin, watch off of antibiotics   2. Alcohol dependence  - Patient acknowledges ongoing frequent binges, last drink yesterday  - He was given  IV Ativan in ED for suspected withdrawal  - Continue to monitor with CIWA and use Ativan as needed, consult TOC for possible resources    3. Elevated LFTs  - Alk phos 318, AST 167, ALT 69, and total bili 3.4 on admission  - US findings consistent with steatosis  - Similar to prior levels and likely secondary to chronic alcohol abuse    4. Intertrigo  - Erythema and maceration in inguinal folds  - Routine hygiene, barrier cream, topical ketoconazole    5. Hyponatremia  - Serum sodium is 131 on admission, appears chronic  - He appeared hypovolemic and was given 2 liters NS in ED, will repeat chem panel in am    6. Thrombocytopenia  - Mild, similar to priors, no bleeding   7. Prolonged QT interval  - QTc is 572 on admission  - Continue cardiac monitoring, monitor electrolytes and supplement as needed, minimize QT-prolonging medications     DVT prophylaxis: Lovenox  Code Status: Full  Level of Care: Level of care:  Telemetry Family Communication: None available  Disposition Plan:  Patient is from: home  Anticipated d/c is to: TBD Anticipated d/c date is: 11/30/20 Patient currently: Pending CT, additional labs, continued improvement  Consults called: None  Admission status: Observation    Vianne Bulls, MD [facility]  11/29/2020, 11:12 PM

## 2020-11-29 NOTE — Progress Notes (Signed)
TOC CM referral for assistance with medications. [facility] He can receive his meds from [place] [facility] for discounted rate and they will mail to him. Pt has assess to PCP through the [place]. Attempted to speak to pt and will continue to follow up. Isidoro Donning RN CCM, [facility] ED TOC CM [phone]

## 2020-11-29 NOTE — ED Triage Notes (Signed)
BIB EMS due to chronic genital skin break down and in buttocks area, for about a month. Pt ? Depressed over wife leaving, has ETOH dependence. 144/98-127-20-97% RA

## 2020-11-29 NOTE — ED Notes (Signed)
Rectal Temp:  101.1 

## 2020-11-30 ENCOUNTER — Observation Stay (HOSPITAL_COMMUNITY): Payer: No Typology Code available for payment source

## 2020-11-30 ENCOUNTER — Encounter (HOSPITAL_COMMUNITY): Payer: Self-pay | Admitting: Family Medicine

## 2020-11-30 ENCOUNTER — Other Ambulatory Visit: Payer: Self-pay

## 2020-11-30 DIAGNOSIS — F17200 Nicotine dependence, unspecified, uncomplicated: Secondary | ICD-10-CM | POA: Diagnosis present

## 2020-11-30 DIAGNOSIS — D696 Thrombocytopenia, unspecified: Secondary | ICD-10-CM | POA: Diagnosis present

## 2020-11-30 DIAGNOSIS — R6510 Systemic inflammatory response syndrome (SIRS) of non-infectious origin without acute organ dysfunction: Secondary | ICD-10-CM | POA: Diagnosis present

## 2020-11-30 DIAGNOSIS — F10239 Alcohol dependence with withdrawal, unspecified: Secondary | ICD-10-CM | POA: Diagnosis present

## 2020-11-30 DIAGNOSIS — L304 Erythema intertrigo: Secondary | ICD-10-CM | POA: Diagnosis present

## 2020-11-30 DIAGNOSIS — D649 Anemia, unspecified: Secondary | ICD-10-CM | POA: Diagnosis present

## 2020-11-30 DIAGNOSIS — R9431 Abnormal electrocardiogram [ECG] [EKG]: Secondary | ICD-10-CM | POA: Diagnosis present

## 2020-11-30 DIAGNOSIS — E876 Hypokalemia: Secondary | ICD-10-CM | POA: Diagnosis present

## 2020-11-30 DIAGNOSIS — E871 Hypo-osmolality and hyponatremia: Secondary | ICD-10-CM | POA: Diagnosis present

## 2020-11-30 DIAGNOSIS — D72829 Elevated white blood cell count, unspecified: Secondary | ICD-10-CM | POA: Diagnosis present

## 2020-11-30 DIAGNOSIS — E861 Hypovolemia: Secondary | ICD-10-CM | POA: Diagnosis present

## 2020-11-30 DIAGNOSIS — B368 Other specified superficial mycoses: Secondary | ICD-10-CM | POA: Diagnosis present

## 2020-11-30 DIAGNOSIS — K760 Fatty (change of) liver, not elsewhere classified: Secondary | ICD-10-CM | POA: Diagnosis present

## 2020-11-30 DIAGNOSIS — Z7982 Long term (current) use of aspirin: Secondary | ICD-10-CM | POA: Diagnosis not present

## 2020-11-30 DIAGNOSIS — Z79899 Other long term (current) drug therapy: Secondary | ICD-10-CM | POA: Diagnosis not present

## 2020-11-30 DIAGNOSIS — R7989 Other specified abnormal findings of blood chemistry: Secondary | ICD-10-CM | POA: Diagnosis present

## 2020-11-30 DIAGNOSIS — Z20822 Contact with and (suspected) exposure to covid-19: Secondary | ICD-10-CM | POA: Diagnosis present

## 2020-11-30 LAB — CBC
HCT: 35.4 % — ABNORMAL LOW (ref 39.0–52.0)
Hemoglobin: 11.4 g/dL — ABNORMAL LOW (ref 13.0–17.0)
MCH: 30.2 pg (ref 26.0–34.0)
MCHC: 32.2 g/dL (ref 30.0–36.0)
MCV: 93.7 fL (ref 80.0–100.0)
Platelets: 105 10*3/uL — ABNORMAL LOW (ref 150–400)
RBC: 3.78 MIL/uL — ABNORMAL LOW (ref 4.22–5.81)
RDW: 16.6 % — ABNORMAL HIGH (ref 11.5–15.5)
WBC: 7.1 10*3/uL (ref 4.0–10.5)
nRBC: 0 % (ref 0.0–0.2)

## 2020-11-30 LAB — COMPREHENSIVE METABOLIC PANEL
ALT: 55 U/L — ABNORMAL HIGH (ref 0–44)
AST: 126 U/L — ABNORMAL HIGH (ref 15–41)
Albumin: 2.8 g/dL — ABNORMAL LOW (ref 3.5–5.0)
Alkaline Phosphatase: 268 U/L — ABNORMAL HIGH (ref 38–126)
Anion gap: 9 (ref 5–15)
BUN: 10 mg/dL (ref 6–20)
CO2: 21 mmol/L — ABNORMAL LOW (ref 22–32)
Calcium: 8.4 mg/dL — ABNORMAL LOW (ref 8.9–10.3)
Chloride: 104 mmol/L (ref 98–111)
Creatinine, Ser: 0.63 mg/dL (ref 0.61–1.24)
GFR, Estimated: >60 mL/min (ref >60)
Glucose, Bld: 88 mg/dL (ref 70–99)
Potassium: 3 mmol/L — ABNORMAL LOW (ref 3.5–5.1)
Sodium: 134 mmol/L — ABNORMAL LOW (ref 135–145)
Total Bilirubin: 2.6 mg/dL — ABNORMAL HIGH (ref 0.3–1.2)
Total Protein: 8.2 g/dL — ABNORMAL HIGH (ref 6.5–8.1)

## 2020-11-30 LAB — PROCALCITONIN: Procalcitonin: 0.28 ng/mL

## 2020-11-30 LAB — MAGNESIUM: Magnesium: 2.2 mg/dL (ref 1.7–2.4)

## 2020-11-30 LAB — PHOSPHORUS: Phosphorus: 2.1 mg/dL — ABNORMAL LOW (ref 2.5–4.6)

## 2020-11-30 LAB — AMMONIA: Ammonia: 49 umol/L — ABNORMAL HIGH (ref 9–35)

## 2020-11-30 MED ORDER — IOHEXOL 300 MG/ML  SOLN
100.0000 mL | Freq: Once | INTRAMUSCULAR | Status: AC | PRN
  Administered 2020-11-30: 100 mL via INTRAVENOUS

## 2020-11-30 MED ORDER — POTASSIUM CHLORIDE CRYS ER 20 MEQ PO TBCR
40.0000 meq | EXTENDED_RELEASE_TABLET | Freq: Two times a day (BID) | ORAL | Status: DC
  Administered 2020-11-30 – 2020-12-04 (×9): 40 meq via ORAL
  Filled 2020-11-30 (×9): qty 2

## 2020-11-30 MED ORDER — ADULT MULTIVITAMIN W/MINERALS CH
1.0000 | ORAL_TABLET | Freq: Every day | ORAL | Status: DC
  Administered 2020-12-01 – 2020-12-04 (×4): 1 via ORAL
  Filled 2020-11-30 (×4): qty 1

## 2020-11-30 MED ORDER — DOXYCYCLINE HYCLATE 100 MG PO TABS
100.0000 mg | ORAL_TABLET | Freq: Two times a day (BID) | ORAL | Status: DC
  Administered 2020-11-30: 100 mg via ORAL
  Filled 2020-11-30 (×2): qty 1

## 2020-11-30 NOTE — ED Notes (Signed)
ED TO INPATIENT HANDOFF REPORT  Name/Age/Gender Dylan Weaver 55 y.o. male  Code Status    Code Status Orders  (From admission, onward)         Start     Ordered   11/29/20 2312  Full code  Continuous        11/29/20 2312        Code Status History    Date Active Date Inactive Code Status Order ID Comments User Context   09/14/2020 2205 09/24/2020 1514 Full Code [id number]  Eduard Clos, MD ED   Advance Care Planning Activity      Home/SNF/Other Home  Chief Complaint SIRS (systemic inflammatory response syndrome) (HCC) [R65.10] Alcohol withdrawal syndrome with complication (HCC) [F10.239]  Level of Care/Admitting Diagnosis ED Disposition    ED Disposition Condition Comment   Admit  Hospital Area: [facility] [[id number]]  Level of Care: Telemetry [5]  Admit to tele based on following criteria: Monitor QTC interval  Covid Evaluation: Confirmed COVID Negative  Diagnosis: SIRS (systemic inflammatory response syndrome) [facility]) [[id number]]  Admitting Physician: Briscoe Deutscher [[id number]]  Attending Physician: Briscoe Deutscher [[id number]]       Medical History Past Medical History:  Diagnosis Date  . ETOH abuse     Allergies No Known Allergies  IV Location/Drains/Wounds Patient Lines/Drains/Airways Status    Active Line/Drains/Airways    Name Placement date Placement time Site Days   Peripheral IV 11/29/20 Right;Posterior Hand 11/29/20  1815  Hand  1   Peripheral IV 11/29/20 Right Forearm 11/29/20  1852  Forearm  1          Labs/Imaging Results for orders placed or performed during the hospital encounter of 11/29/20 (from the past 48 hour(s))  Resp Panel by RT-PCR (Flu A&B, Covid) Nasopharyngeal Swab     Status: None   Collection Time: 11/29/20  6:27 PM   Specimen: Nasopharyngeal Swab; Nasopharyngeal(NP) swabs in vial transport medium  Result Value Ref Range   SARS Coronavirus 2 by RT PCR NEGATIVE NEGATIVE    Comment:  (NOTE) SARS-CoV-2 target nucleic acids are NOT DETECTED.  The SARS-CoV-2 RNA is generally detectable in upper respiratory specimens during the acute phase of infection. The lowest concentration of SARS-CoV-2 viral copies this assay can detect is 138 copies/mL. A negative result does not preclude SARS-Cov-2 infection and should not be used as the sole basis for treatment or other patient management decisions. A negative result may occur with  improper specimen collection/handling, submission of specimen other than nasopharyngeal swab, presence of viral mutation(s) within the areas targeted by this assay, and inadequate number of viral copies(<138 copies/mL). A negative result must be combined with clinical observations, patient history, and epidemiological information. The expected result is Negative.  Fact Sheet for Patients:  BloggerCourse.com  Fact Sheet for Healthcare Providers:  SeriousBroker.it  This test is no t yet approved or cleared by the [place] FDA and  has been authorized for detection and/or diagnosis of SARS-CoV-2 by FDA under an Emergency Use Authorization (EUA). This EUA will remain  in effect (meaning this test can be used) for the duration of the COVID-19 declaration under Section 564(b)(1) of the Act, 21 U.S.C.section 360bbb-3(b)(1), unless the authorization is terminated  or revoked sooner.       Influenza A by PCR NEGATIVE NEGATIVE   Influenza B by PCR NEGATIVE NEGATIVE    Comment: (NOTE) The Xpert Xpress SARS-CoV-2/FLU/RSV plus assay is intended as an aid in the diagnosis of influenza  from Nasopharyngeal swab specimens and should not be used as a sole basis for treatment. Nasal washings and aspirates are unacceptable for Xpert Xpress SARS-CoV-2/FLU/RSV testing.  Fact Sheet for Patients: BloggerCourse.com  Fact Sheet for Healthcare  Providers: SeriousBroker.it  This test is not yet approved or cleared by the [place] FDA and has been authorized for detection and/or diagnosis of SARS-CoV-2 by FDA under an Emergency Use Authorization (EUA). This EUA will remain in effect (meaning this test can be used) for the duration of the COVID-19 declaration under Section 564(b)(1) of the Act, 21 U.S.C. section 360bbb-3(b)(1), unless the authorization is terminated or revoked.  Performed at [facility], [address] [place], [place] [address]   CBC with Differential     Status: Abnormal   Collection Time: 11/29/20  6:45 PM  Result Value Ref Range   WBC 11.2 (H) 4.0 - 10.5 K/uL   RBC 4.31 4.22 - 5.81 MIL/uL   Hemoglobin 12.9 (L) 13.0 - 17.0 g/dL   HCT 44.0 34.7 - 42.5 %   MCV 91.2 80.0 - 100.0 fL   MCH 29.9 26.0 - 34.0 pg   MCHC 32.8 30.0 - 36.0 g/dL   RDW 95.6 (H) 38.7 - 56.4 %   Platelets 123 (L) 150 - 400 K/uL    Comment: Immature Platelet Fraction may be clinically indicated, consider ordering this additional test [id number]    nRBC 0.0 0.0 - 0.2 %   Neutrophils Relative % 83 %   Neutro Abs 9.2 (H) 1.7 - 7.7 K/uL   Lymphocytes Relative 10 %   Lymphs Abs 1.1 0.7 - 4.0 K/uL   Monocytes Relative 7 %   Monocytes Absolute 0.7 0.1 - 1.0 K/uL   Eosinophils Relative 0 %   Eosinophils Absolute 0.0 0.0 - 0.5 K/uL   Basophils Relative 0 %   Basophils Absolute 0.1 0.0 - 0.1 K/uL   Immature Granulocytes 0 %   Abs Immature Granulocytes 0.03 0.00 - 0.07 K/uL    Comment: Performed at [facility], [address] [place], [place] [address]  Comprehensive metabolic panel     Status: Abnormal   Collection Time: 11/29/20  6:45 PM  Result Value Ref Range   Sodium 131 (L) 135 - 145 mmol/L   Potassium 4.3 3.5 - 5.1 mmol/L   Chloride 98 98 - 111 mmol/L   CO2 19 (L) 22 - 32 mmol/L   Glucose, Bld 114 (H) 70 - 99 mg/dL    Comment: Glucose reference range  applies only to samples taken after fasting for at least 8 hours.   BUN 17 6 - 20 mg/dL   Creatinine, Ser 6.30 0.61 - 1.24 mg/dL   Calcium 9.1 8.9 - 16.0 mg/dL   Total Protein 10.9 (H) 6.5 - 8.1 g/dL   Albumin 3.4 (L) 3.5 - 5.0 g/dL   AST 323 (H) 15 - 41 U/L   ALT 69 (H) 0 - 44 U/L   Alkaline Phosphatase 318 (H) 38 - 126 U/L   Total Bilirubin 3.4 (H) 0.3 - 1.2 mg/dL   GFR, Estimated >55 >73 mL/min    Comment: (NOTE) Calculated using the CKD-EPI Creatinine Equation (2021)    Anion gap 14 5 - 15    Comment: Performed at [facility], [address], [place], [place] [address]  CK     Status: None   Collection Time: 11/29/20  6:45 PM  Result Value Ref Range   Total CK 82 49 -  397 U/L    Comment: Performed at [facility], [address], [place], [place] [address]  Lactic acid, plasma     Status: None   Collection Time: 11/29/20  6:45 PM  Result Value Ref Range   Lactic Acid, Venous 1.8 0.5 - 1.9 mmol/L    Comment: Performed at [facility], [address] [place], [place] [address]  Acetaminophen level     Status: Abnormal   Collection Time: 11/29/20  6:45 PM  Result Value Ref Range   Acetaminophen (Tylenol), Serum <10 (L) 10 - 30 ug/mL    Comment: (NOTE) Therapeutic concentrations vary significantly. A range of 10-30 ug/mL  may be an effective concentration for many patients. However, some  are best treated at concentrations outside of this range. Acetaminophen concentrations >150 ug/mL at 4 hours after ingestion  and >50 ug/mL at 12 hours after ingestion are often associated with  toxic reactions.  Performed at [facility], [address], [place], [place] [address]   Salicylate level     Status: Abnormal   Collection Time: 11/29/20  6:45 PM  Result Value Ref Range   Salicylate Lvl <7.0 (L) 7.0 - 30.0 mg/dL    Comment: Performed at [facility], [address],  [place], [place] [address]  Magnesium     Status: None   Collection Time: 11/29/20  6:45 PM  Result Value Ref Range   Magnesium 1.8 1.7 - 2.4 mg/dL    Comment: Performed at [facility], [address] [place], [place] [address]  Protime-INR     Status: None   Collection Time: 11/29/20  6:45 PM  Result Value Ref Range   Prothrombin Time 12.5 11.4 - 15.2 seconds   INR 1.0 0.8 - 1.2    Comment: (NOTE) INR goal varies based on device and disease states. Performed at [facility], [address] [place], [place] [address]   Urinalysis, Routine w reflex microscopic Urine, Catheterized     Status: Abnormal   Collection Time: 11/29/20  9:13 PM  Result Value Ref Range   Color, Urine AMBER (A) YELLOW    Comment: BIOCHEMICALS MAY BE AFFECTED BY COLOR   APPearance CLEAR CLEAR   Specific Gravity, Urine 1.029 1.005 - 1.030   pH 5.0 5.0 - 8.0   Glucose, UA 150 (A) NEGATIVE mg/dL   Hgb urine dipstick SMALL (A) NEGATIVE   Bilirubin Urine SMALL (A) NEGATIVE   Ketones, ur 20 (A) NEGATIVE mg/dL   Protein, ur 564 (A) NEGATIVE mg/dL   Nitrite NEGATIVE NEGATIVE   Leukocytes,Ua NEGATIVE NEGATIVE   WBC, UA 0-5 0 - 5 WBC/hpf   Bacteria, UA NONE SEEN NONE SEEN   Squamous Epithelial / LPF 0-5 0 - 5   Mucus PRESENT    Hyaline Casts, UA PRESENT     Comment: Performed at [facility], [address] [place], [place] [address]  Procalcitonin - Baseline     Status: None   Collection Time: 11/29/20 11:05 PM  Result Value Ref Range   Procalcitonin 0.28 ng/mL    Comment:        Interpretation: PCT (Procalcitonin) <= 0.5 ng/mL: Systemic infection (sepsis) is not likely. Local bacterial infection is possible. (NOTE)       Sepsis PCT Algorithm           Lower Respiratory Tract  Infection PCT Algorithm    ----------------------------     ----------------------------         PCT < 0.25 ng/mL                PCT < 0.10  ng/mL          Strongly encourage             Strongly discourage   discontinuation of antibiotics    initiation of antibiotics    ----------------------------     -----------------------------       PCT 0.25 - 0.50 ng/mL            PCT 0.10 - 0.25 ng/mL               OR       >80% decrease in PCT            Discourage initiation of                                            antibiotics      Encourage discontinuation           of antibiotics    ----------------------------     -----------------------------         PCT >= 0.50 ng/mL              PCT 0.26 - 0.50 ng/mL               AND        <80% decrease in PCT             Encourage initiation of                                             antibiotics       Encourage continuation           of antibiotics    ----------------------------     -----------------------------        PCT >= 0.50 ng/mL                  PCT > 0.50 ng/mL               AND         increase in PCT                  Strongly encourage                                      initiation of antibiotics    Strongly encourage escalation           of antibiotics                                     -----------------------------                                           PCT <= 0.25 ng/mL  OR                                        > 80% decrease in PCT                                      Discontinue / Do not initiate                                             antibiotics  Performed at [facility], [address], [place], [place] [address]   Ethanol     Status: None   Collection Time: 11/29/20 11:05 PM  Result Value Ref Range   Alcohol, Ethyl (B) <10 <10 mg/dL    Comment: (NOTE) Lowest detectable limit for serum alcohol is 10 mg/dL.  For medical purposes only. Performed at [facility], [address], [place], [place] [address]   Ammonia     Status: Abnormal   Collection  Time: 11/29/20 11:05 PM  Result Value Ref Range   Ammonia 49 (H) 9 - 35 umol/L    Comment: Performed at [facility], [address] [place], [place] [address]   CT PELVIS W CONTRAST  Result Date: 11/30/2020 CLINICAL DATA:  Soft tissue infection suspected. Skin breakdown around buttocks x1 month. EXAM: CT PELVIS WITH CONTRAST TECHNIQUE: Multidetector CT imaging of the pelvis was performed using the standard protocol following the bolus administration of intravenous contrast. CONTRAST:  OMNIPAQUE IOHEXOL 300 MG/ML  SOLN COMPARISON:  None. FINDINGS: Urinary Tract:  No abnormality visualized. Bowel: There are scattered colonic diverticula without CT evidence for diverticulitis involving the visualized portions of the colon. The appendix is unremarkable. Vascular/Lymphatic: There mild vascular calcifications of the visualized abdominal aorta without evidence for an aneurysm. There are enlarged bilateral inguinal lymph nodes. Reproductive:  No mass or other significant abnormality Other:  None. Musculoskeletal: No suspicious bone lesions identified. IMPRESSION: 1. Enlarged bilateral inguinal lymph nodes. These are nonspecific and may be reactive in etiology. 2. No abscess.  No pockets of subcutaneous gas. 3. Scattered colonic diverticula without CT evidence for diverticulitis involving the visualized portions of the colon. Aortic Atherosclerosis (ICD10-I70.0). Electronically Signed   By: Katherine Mantle M.D.   On: 11/30/2020 02:33   US Abdomen Limited  Result Date: 11/29/2020 CLINICAL DATA:  Right upper quadrant pain, transaminitis, fever EXAM: ULTRASOUND ABDOMEN LIMITED RIGHT UPPER QUADRANT COMPARISON:  None. FINDINGS: Gallbladder: No gallstones or wall thickening visualized. No sonographic Murphy sign noted by sonographer. Common bile duct: Diameter: 4 mm Liver: Liver demonstrates diffuse increased echotexture, consistent with hepatic steatosis. No focal abnormality or biliary  dilation. Portal vein is patent on color Doppler imaging with normal direction of blood flow towards the liver. Other: None. IMPRESSION: 1. Increased liver echotexture consistent with hepatic steatosis. Electronically Signed   By: Sharlet Salina M.D.   On: 11/29/2020 20:45   DG Chest Portable 1 View  Result Date: 11/29/2020 CLINICAL DATA:  Tachycardia and shortness of breath EXAM: PORTABLE CHEST 1 VIEW COMPARISON:  09/15/2019 FINDINGS: The heart size and mediastinal contours are within normal limits. Both lungs are clear. The visualized skeletal structures are unremarkable. IMPRESSION: No active  disease. Electronically Signed   By: Alcide CleverMark  Lukens M.D.   On: 11/29/2020 18:12    Pending Labs Unresulted Labs (From admission, onward)          Start     Ordered   12/06/20 0500  Creatinine, serum  (enoxaparin (LOVENOX)    CrCl >/= 30 ml/min)  Weekly,   R     Comments: while on enoxaparin therapy    11/29/20 2312   11/30/20 0500  Comprehensive metabolic panel  Tomorrow morning,   R        11/29/20 2312   11/30/20 0500  Magnesium  Tomorrow morning,   R        11/29/20 2312   11/30/20 0500  Phosphorus  Tomorrow morning,   R        11/29/20 2312   11/30/20 0500  CBC  Tomorrow morning,   R        11/29/20 2312   11/29/20 1849  Urine culture  ONCE - STAT,   STAT        11/29/20 1848   11/29/20 1827  Blood culture (routine x 2)  BLOOD CULTURE X 2,   STAT      11/29/20 1826          Vitals/Pain Today's Vitals   11/30/20 0030 11/30/20 0100 11/30/20 0130 11/30/20 0252  BP: 113/72 102/67 106/65 117/76  Pulse: 73 74 71 75  Resp: 19 16 (!) 24 16  Temp:    97.9 F (36.6 C)  TempSrc:    Oral  SpO2: 98% 93% 94% 99%  Weight:    76.5 kg  Height:    6' (1.829 m)  PainSc:        Isolation Precautions No active isolations  Medications Medications  ketoconazole (NIZORAL) 2 % cream ( Topical Not Given 11/30/20 0000)  LORazepam (ATIVAN) tablet 1-4 mg (has no administration in time range)    Or   LORazepam (ATIVAN) injection 1-4 mg (has no administration in time range)  thiamine tablet 100 mg (has no administration in time range)    Or  thiamine (B-1) injection 100 mg (has no administration in time range)  folic acid (FOLVITE) tablet 1 mg (has no administration in time range)  multivitamin with minerals tablet 1 tablet (has no administration in time range)  LORazepam (ATIVAN) injection 0-4 mg (1 mg Intravenous Given 11/29/20 2347)    Followed by  LORazepam (ATIVAN) injection 0-4 mg (has no administration in time range)  enoxaparin (LOVENOX) injection 40 mg (has no administration in time range)  0.9 %  sodium chloride infusion (has no administration in time range)  acetaminophen (TYLENOL) tablet 325 mg (has no administration in time range)    Or  acetaminophen (TYLENOL) suppository 325 mg (has no administration in time range)  senna-docusate (Senokot-S) tablet 1 tablet (has no administration in time range)  Gerhardt's butt cream (has no administration in time range)  LORazepam (ATIVAN) injection 1 mg (1 mg Intravenous Given 11/29/20 1820)  sodium chloride 0.9 % bolus 1,000 mL (0 mLs Intravenous Stopped 11/29/20 2112)  ceFEPIme (MAXIPIME) 2 g in sodium chloride 0.9 % 100 mL IVPB (0 g Intravenous Stopped 11/29/20 1931)  vancomycin (VANCOCIN) IVPB 1000 mg/200 mL premix (0 mg Intravenous Stopped 11/29/20 2112)  sodium chloride 0.9 % bolus 1,000 mL (0 mLs Intravenous Stopped 11/29/20 2113)  acetaminophen (TYLENOL) tablet 650 mg (650 mg Oral Given 11/29/20 1902)  magnesium sulfate IVPB 1 g 100 mL (0 g Intravenous Stopped 11/29/20  2113)  magnesium sulfate IVPB 1 g 100 mL (0 g Intravenous Stopped 11/30/20 0129)  iohexol (OMNIPAQUE) 300 MG/ML solution 100 mL (100 mLs Intravenous Contrast Given 11/30/20 0158)    Mobility walks

## 2020-11-30 NOTE — Hospital Course (Signed)
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

## 2020-11-30 NOTE — Progress Notes (Addendum)
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  09/14/2020 through 09/24/2020-treated for Klebsiella UTI at that time  Represented to [facility] long with rash  Febrile 38 degrees  sats 90s EKG sinus tach abdominal US steatosis Covid negative    Hospital-Problem based course  SIRS on admission 2/2 fungal dermatitis One-time fever 101.1-CT pelvis 3/1 no obvious pathology hold cefepime vancomycin--follow urine/blood culture Will empirically use 3 days of doxycycline orally  Continue Gerhart's, ketoconazole cream twice daily  Wound nurse consulted  Patient informed he has to turn every 2 Alcohol dependence/ steatosis/ pancytopenia  no specific tremor or encephalopathy despite ammonia 49  Monitor trends of CIWA-currently 5-8 Last drink patient claims on 2/27  Trend labs Chronic hyponatremia from chronic EtOH  Improved from admission Prolonged QTC/hypokalemia  Replace with 40 twice daily  Repeat labs in addition to magnesium in a.m.   DVT prophylaxis: Lovenox Code Status: Full Family Communication: called to d/w Mrs Lenord Fellers area code [phone] answer on phone-left VM Disposition:  Status is: Inpatient  Remains inpatient appropriate because:Hemodynamically unstable, Ongoing active pain requiring inpatient pain management, Altered mental status and Unsafe d/c plan   Dispo: The patient is from: Home              Anticipated d/c is to: To be discussed              Patient currently is not medically stable to d/c.   Difficult to place patient No  Consultants:   None Procedures: No  Antimicrobials: Topical   Subjective: Nursing reports patient is defecating in the bed-Solid Patient claims to me however he gets up  and goes to get to the grocery store etc. When asked him how he gets food he orders door-dash Patient calm collected coherent   Objective: Vitals:   11/30/20 1107 11/30/20 1153 11/30/20 1315 11/30/20 1511  BP: 120/86 112/82 110/80 116/82  Pulse: 75 80 78 80  Resp: 20   18  Temp: 97.9 F (36.6 C)   98.4 F (36.9 C)  TempSrc: Oral   Oral  SpO2: 98%   100%  Weight:      Height:        Intake/Output Summary (Last 24 hours) at 11/30/2020 1734 Last data filed at 11/30/2020 1200 Gross per 24 hour  Intake 2300 ml  Output 525 ml  Net 1775 ml   Filed Weights   11/30/20 0252  Weight: 76.5 kg    Examination: EOMI NCAT disheveled thick beard No icterus no pallor Neck soft supple No tremor or flap Chest clear no added sound no rales rhonchi S1-S2 no murmur no rub no gallop Lower extremities assessed no swelling Beefy red area to entire sacral area with significant rubor-slightly tender to touch   Data Reviewed: personally reviewed   CBC    Component Value Date/Time   WBC 7.1 11/30/2020 0450   RBC 3.78 (L) 11/30/2020 0450   HGB 11.4 (L) 11/30/2020 0450   HCT 35.4 (L) 11/30/2020 0450   PLT 105 (L) 11/30/2020 0450   MCV 93.7 11/30/2020 0450   MCH 30.2 11/30/2020 0450   MCHC 32.2 11/30/2020 0450   RDW 16.6 (H) 11/30/2020 0450   LYMPHSABS 1.1 11/29/2020 1845   MONOABS 0.7 11/29/2020 1845   EOSABS 0.0 11/29/2020 1845  BASOSABS 0.1 11/29/2020 1845   CMP Latest Ref Rng & Units 11/30/2020 11/29/2020 09/24/2020  Glucose 70 - 99 mg/dL 88 322(G) -  BUN 6 - 20 mg/dL 10 17 -  Creatinine 2.54 - 1.24 mg/dL 2.70 6.23 -  Sodium 762 - 145 mmol/L 134(L) 131(L) 131(L)  Potassium 3.5 - 5.1 mmol/L 3.0(L) 4.3 -  Chloride 98 - 111 mmol/L 104 98 -  CO2 22 - 32 mmol/L 21(L) 19(L) -  Calcium 8.9 - 10.3 mg/dL 8.3(T) 9.1 -  Total Protein 6.5 - 8.1 g/dL 8.2(H) 10.2(H) -  Total Bilirubin 0.3 - 1.2 mg/dL 2.6(H) 3.4(H) -  Alkaline Phos 38 - 126 U/L 268(H) 318(H) -  AST 15 - 41 U/L 126(H) 167(H) -   ALT 0 - 44 U/L 55(H) 69(H) -     Radiology Studies: CT PELVIS W CONTRAST  Result Date: 11/30/2020 CLINICAL DATA:  Soft tissue infection suspected. Skin breakdown around buttocks x1 month. EXAM: CT PELVIS WITH CONTRAST TECHNIQUE: Multidetector CT imaging of the pelvis was performed using the standard protocol following the bolus administration of intravenous contrast. CONTRAST:  OMNIPAQUE IOHEXOL 300 MG/ML  SOLN COMPARISON:  None. FINDINGS: Urinary Tract:  No abnormality visualized. Bowel: There are scattered colonic diverticula without CT evidence for diverticulitis involving the visualized portions of the colon. The appendix is unremarkable. Vascular/Lymphatic: There mild vascular calcifications of the visualized abdominal aorta without evidence for an aneurysm. There are enlarged bilateral inguinal lymph nodes. Reproductive:  No mass or other significant abnormality Other:  None. Musculoskeletal: No suspicious bone lesions identified. IMPRESSION: 1. Enlarged bilateral inguinal lymph nodes. These are nonspecific and may be reactive in etiology. 2. No abscess.  No pockets of subcutaneous gas. 3. Scattered colonic diverticula without CT evidence for diverticulitis involving the visualized portions of the colon. Aortic Atherosclerosis (ICD10-I70.0). Electronically Signed   By: Katherine Mantle M.D.   On: 11/30/2020 02:33   US Abdomen Limited  Result Date: 11/29/2020 CLINICAL DATA:  Right upper quadrant pain, transaminitis, fever EXAM: ULTRASOUND ABDOMEN LIMITED RIGHT UPPER QUADRANT COMPARISON:  None. FINDINGS: Gallbladder: No gallstones or wall thickening visualized. No sonographic Murphy sign noted by sonographer. Common bile duct: Diameter: 4 mm Liver: Liver demonstrates diffuse increased echotexture, consistent with hepatic steatosis. No focal abnormality or biliary dilation. Portal vein is patent on color Doppler imaging with normal direction of blood flow towards the liver. Other: None.  IMPRESSION: 1. Increased liver echotexture consistent with hepatic steatosis. Electronically Signed   By: Sharlet Salina M.D.   On: 11/29/2020 20:45   DG Chest Portable 1 View  Result Date: 11/29/2020 CLINICAL DATA:  Tachycardia and shortness of breath EXAM: PORTABLE CHEST 1 VIEW COMPARISON:  09/15/2019 FINDINGS: The heart size and mediastinal contours are within normal limits. Both lungs are clear. The visualized skeletal structures are unremarkable. IMPRESSION: No active disease. Electronically Signed   By: Alcide Clever M.D.   On: 11/29/2020 18:12     Scheduled Meds: . enoxaparin (LOVENOX) injection  40 mg Subcutaneous Q24H  . folic acid  1 mg Oral Daily  . Gerhardt's butt cream   Topical BID  . ketoconazole   Topical BID  . LORazepam  0-4 mg Intravenous Q6H   Followed by  . [START ON 12/02/2020] LORazepam  0-4 mg Intravenous Q12H  . multivitamin with minerals  1 tablet Oral Daily  . potassium chloride  40 mEq Oral BID  . thiamine  100 mg Oral Daily   Or  . thiamine  100 mg Intravenous  Daily   Continuous Infusions: . sodium chloride       LOS: 0 days   Time spent: 13  Rhetta Mura, MD [facility] To contact the attending provider between 7A-7P or the covering provider during after hours 7P-7A, please log into the web site [email or web address] and access using universal [place] password for that web site. If you do not have the password, please call the hospital operator.  11/30/2020, 5:34 PM

## 2020-11-30 NOTE — ED Notes (Signed)
ED TO INPATIENT HANDOFF REPORT  ED Nurse Name and Phone #: Elsie Ra [phone]  S Name/Age/Gender Dylan Weaver 55 y.o. male Room/Bed: RESA/RESA  Code Status   Code Status: Full Code  Home/SNF/Other Home Patient oriented to: self, place, time and situation Is this baseline? Yes   Triage Complete: Triage complete  Chief Complaint SIRS (systemic inflammatory response syndrome) (HCC) [R65.10]  Triage Note BIB EMS due to chronic genital skin break down and in buttocks area, for about a month. Pt ? Depressed over wife leaving, has ETOH dependence. 144/98-127-20-97% RA     Allergies No Known Allergies  Level of Care/Admitting Diagnosis ED Disposition    ED Disposition Condition Comment   Admit  Hospital Area: [facility] [[id number]]  Level of Care: Telemetry [5]  Admit to tele based on following criteria: Monitor QTC interval  Covid Evaluation: Confirmed COVID Negative  Diagnosis: SIRS (systemic inflammatory response syndrome) [facility]) [[id number]]  Admitting Physician: Briscoe Deutscher [[id number]]  Attending Physician: Briscoe Deutscher [[id number]]       B Medical/Surgery History Past Medical History:  Diagnosis Date  . ETOH abuse    Past Surgical History:  Procedure Laterality Date  . NASAL SEPTUM SURGERY       A IV Location/Drains/Wounds Patient Lines/Drains/Airways Status    Active Line/Drains/Airways    Name Placement date Placement time Site Days   Peripheral IV 11/29/20 Right;Posterior Hand 11/29/20  1815  Hand  1   Peripheral IV 11/29/20 Right Forearm 11/29/20  1852  Forearm  1          Intake/Output Last 24 hours  Intake/Output Summary (Last 24 hours) at 11/30/2020 0229 Last data filed at 11/29/2020 2113 Gross per 24 hour  Intake 2300 ml  Output --  Net 2300 ml    Labs/Imaging Results for orders placed or performed during the hospital encounter of 11/29/20 (from the past 48 hour(s))  Resp Panel by RT-PCR (Flu A&B, Covid)  Nasopharyngeal Swab     Status: None   Collection Time: 11/29/20  6:27 PM   Specimen: Nasopharyngeal Swab; Nasopharyngeal(NP) swabs in vial transport medium  Result Value Ref Range   SARS Coronavirus 2 by RT PCR NEGATIVE NEGATIVE    Comment: (NOTE) SARS-CoV-2 target nucleic acids are NOT DETECTED.  The SARS-CoV-2 RNA is generally detectable in upper respiratory specimens during the acute phase of infection. The lowest concentration of SARS-CoV-2 viral copies this assay can detect is 138 copies/mL. A negative result does not preclude SARS-Cov-2 infection and should not be used as the sole basis for treatment or other patient management decisions. A negative result may occur with  improper specimen collection/handling, submission of specimen other than nasopharyngeal swab, presence of viral mutation(s) within the areas targeted by this assay, and inadequate number of viral copies(<138 copies/mL). A negative result must be combined with clinical observations, patient history, and epidemiological information. The expected result is Negative.  Fact Sheet for Patients:  BloggerCourse.com  Fact Sheet for Healthcare Providers:  SeriousBroker.it  This test is no t yet approved or cleared by the [place] FDA and  has been authorized for detection and/or diagnosis of SARS-CoV-2 by FDA under an Emergency Use Authorization (EUA). This EUA will remain  in effect (meaning this test can be used) for the duration of the COVID-19 declaration under Section 564(b)(1) of the Act, 21 U.S.C.section 360bbb-3(b)(1), unless the authorization is terminated  or revoked sooner.       Influenza A by PCR NEGATIVE NEGATIVE  Influenza B by PCR NEGATIVE NEGATIVE    Comment: (NOTE) The Xpert Xpress SARS-CoV-2/FLU/RSV plus assay is intended as an aid in the diagnosis of influenza from Nasopharyngeal swab specimens and should not be used as a sole basis for  treatment. Nasal washings and aspirates are unacceptable for Xpert Xpress SARS-CoV-2/FLU/RSV testing.  Fact Sheet for Patients: BloggerCourse.com  Fact Sheet for Healthcare Providers: SeriousBroker.it  This test is not yet approved or cleared by the [place] FDA and has been authorized for detection and/or diagnosis of SARS-CoV-2 by FDA under an Emergency Use Authorization (EUA). This EUA will remain in effect (meaning this test can be used) for the duration of the COVID-19 declaration under Section 564(b)(1) of the Act, 21 U.S.C. section 360bbb-3(b)(1), unless the authorization is terminated or revoked.  Performed at [facility], [address], [place], [place] [address]   CBC with Differential     Status: Abnormal   Collection Time: 11/29/20  6:45 PM  Result Value Ref Range   WBC 11.2 (H) 4.0 - 10.5 K/uL   RBC 4.31 4.22 - 5.81 MIL/uL   Hemoglobin 12.9 (L) 13.0 - 17.0 g/dL   HCT 60.4 54.0 - 98.1 %   MCV 91.2 80.0 - 100.0 fL   MCH 29.9 26.0 - 34.0 pg   MCHC 32.8 30.0 - 36.0 g/dL   RDW 19.1 (H) 47.8 - 29.5 %   Platelets 123 (L) 150 - 400 K/uL    Comment: Immature Platelet Fraction may be clinically indicated, consider ordering this additional test [id number]    nRBC 0.0 0.0 - 0.2 %   Neutrophils Relative % 83 %   Neutro Abs 9.2 (H) 1.7 - 7.7 K/uL   Lymphocytes Relative 10 %   Lymphs Abs 1.1 0.7 - 4.0 K/uL   Monocytes Relative 7 %   Monocytes Absolute 0.7 0.1 - 1.0 K/uL   Eosinophils Relative 0 %   Eosinophils Absolute 0.0 0.0 - 0.5 K/uL   Basophils Relative 0 %   Basophils Absolute 0.1 0.0 - 0.1 K/uL   Immature Granulocytes 0 %   Abs Immature Granulocytes 0.03 0.00 - 0.07 K/uL    Comment: Performed at [facility], [address], [place], [place] [address]  Comprehensive metabolic panel     Status: Abnormal   Collection Time: 11/29/20  6:45 PM  Result Value Ref Range    Sodium 131 (L) 135 - 145 mmol/L   Potassium 4.3 3.5 - 5.1 mmol/L   Chloride 98 98 - 111 mmol/L   CO2 19 (L) 22 - 32 mmol/L   Glucose, Bld 114 (H) 70 - 99 mg/dL    Comment: Glucose reference range applies only to samples taken after fasting for at least 8 hours.   BUN 17 6 - 20 mg/dL   Creatinine, Ser 6.29 0.61 - 1.24 mg/dL   Calcium 9.1 8.9 - 52.8 mg/dL   Total Protein 41.3 (H) 6.5 - 8.1 g/dL   Albumin 3.4 (L) 3.5 - 5.0 g/dL   AST 244 (H) 15 - 41 U/L   ALT 69 (H) 0 - 44 U/L   Alkaline Phosphatase 318 (H) 38 - 126 U/L   Total Bilirubin 3.4 (H) 0.3 - 1.2 mg/dL   GFR, Estimated >01 >02 mL/min    Comment: (NOTE) Calculated using the CKD-EPI Creatinine Equation (2021)    Anion gap 14 5 - 15    Comment: Performed at [facility], [address] [place], [place] [address]  CK     Status: None   Collection Time: 11/29/20  6:45 PM  Result Value Ref Range   Total CK 82 49 - 397 U/L    Comment: Performed at [facility], [address], [place], [place] [address]  Lactic acid, plasma     Status: None   Collection Time: 11/29/20  6:45 PM  Result Value Ref Range   Lactic Acid, Venous 1.8 0.5 - 1.9 mmol/L    Comment: Performed at [facility], [address], [place], [place] [address]  Acetaminophen level     Status: Abnormal   Collection Time: 11/29/20  6:45 PM  Result Value Ref Range   Acetaminophen (Tylenol), Serum <10 (L) 10 - 30 ug/mL    Comment: (NOTE) Therapeutic concentrations vary significantly. A range of 10-30 ug/mL  may be an effective concentration for many patients. However, some  are best treated at concentrations outside of this range. Acetaminophen concentrations >150 ug/mL at 4 hours after ingestion  and >50 ug/mL at 12 hours after ingestion are often associated with  toxic reactions.  Performed at [facility], [address] [place], [place] [address]   Salicylate level     Status:  Abnormal   Collection Time: 11/29/20  6:45 PM  Result Value Ref Range   Salicylate Lvl <7.0 (L) 7.0 - 30.0 mg/dL    Comment: Performed at [facility], [address] [place], [place] [address]  Magnesium     Status: None   Collection Time: 11/29/20  6:45 PM  Result Value Ref Range   Magnesium 1.8 1.7 - 2.4 mg/dL    Comment: Performed at [facility], [address], [place], [place] [address]  Protime-INR     Status: None   Collection Time: 11/29/20  6:45 PM  Result Value Ref Range   Prothrombin Time 12.5 11.4 - 15.2 seconds   INR 1.0 0.8 - 1.2    Comment: (NOTE) INR goal varies based on device and disease states. Performed at [facility], [address], [place], [place] [address]   Urinalysis, Routine w reflex microscopic Urine, Catheterized     Status: Abnormal   Collection Time: 11/29/20  9:13 PM  Result Value Ref Range   Color, Urine AMBER (A) YELLOW    Comment: BIOCHEMICALS MAY BE AFFECTED BY COLOR   APPearance CLEAR CLEAR   Specific Gravity, Urine 1.029 1.005 - 1.030   pH 5.0 5.0 - 8.0   Glucose, UA 150 (A) NEGATIVE mg/dL   Hgb urine dipstick SMALL (A) NEGATIVE   Bilirubin Urine SMALL (A) NEGATIVE   Ketones, ur 20 (A) NEGATIVE mg/dL   Protein, ur 824 (A) NEGATIVE mg/dL   Nitrite NEGATIVE NEGATIVE   Leukocytes,Ua NEGATIVE NEGATIVE   WBC, UA 0-5 0 - 5 WBC/hpf   Bacteria, UA NONE SEEN NONE SEEN   Squamous Epithelial / LPF 0-5 0 - 5   Mucus PRESENT    Hyaline Casts, UA PRESENT     Comment: Performed at [facility], [address], [place], [place] [address]  Procalcitonin - Baseline     Status: None   Collection Time: 11/29/20 11:05 PM  Result Value Ref Range   Procalcitonin 0.28 ng/mL    Comment:        Interpretation: PCT (Procalcitonin) <= 0.5 ng/mL: Systemic infection (sepsis) is not likely. Local bacterial infection is possible. (NOTE)       Sepsis PCT Algorithm  Lower  Respiratory Tract                                      Infection PCT Algorithm    ----------------------------     ----------------------------         PCT < 0.25 ng/mL                PCT < 0.10 ng/mL          Strongly encourage             Strongly discourage   discontinuation of antibiotics    initiation of antibiotics    ----------------------------     -----------------------------       PCT 0.25 - 0.50 ng/mL            PCT 0.10 - 0.25 ng/mL               OR       >80% decrease in PCT            Discourage initiation of                                            antibiotics      Encourage discontinuation           of antibiotics    ----------------------------     -----------------------------         PCT >= 0.50 ng/mL              PCT 0.26 - 0.50 ng/mL               AND        <80% decrease in PCT             Encourage initiation of                                             antibiotics       Encourage continuation           of antibiotics    ----------------------------     -----------------------------        PCT >= 0.50 ng/mL                  PCT > 0.50 ng/mL               AND         increase in PCT                  Strongly encourage                                      initiation of antibiotics    Strongly encourage escalation           of antibiotics                                     -----------------------------  PCT <= 0.25 ng/mL                                                 OR                                        > 80% decrease in PCT                                      Discontinue / Do not initiate                                             antibiotics  Performed at [facility], [address] [place], [place] [address]   Ethanol     Status: None   Collection Time: 11/29/20 11:05 PM  Result Value Ref Range   Alcohol, Ethyl (B) <10 <10 mg/dL    Comment: (NOTE) Lowest detectable limit for  serum alcohol is 10 mg/dL.  For medical purposes only. Performed at [facility], [address] [place], [place] [address]   Ammonia     Status: Abnormal   Collection Time: 11/29/20 11:05 PM  Result Value Ref Range   Ammonia 49 (H) 9 - 35 umol/L    Comment: Performed at [facility], [address] [place], [place] [address]   US Abdomen Limited  Result Date: 11/29/2020 CLINICAL DATA:  Right upper quadrant pain, transaminitis, fever EXAM: ULTRASOUND ABDOMEN LIMITED RIGHT UPPER QUADRANT COMPARISON:  None. FINDINGS: Gallbladder: No gallstones or wall thickening visualized. No sonographic Murphy sign noted by sonographer. Common bile duct: Diameter: 4 mm Liver: Liver demonstrates diffuse increased echotexture, consistent with hepatic steatosis. No focal abnormality or biliary dilation. Portal vein is patent on color Doppler imaging with normal direction of blood flow towards the liver. Other: None. IMPRESSION: 1. Increased liver echotexture consistent with hepatic steatosis. Electronically Signed   By: Sharlet Salina M.D.   On: 11/29/2020 20:45   DG Chest Portable 1 View  Result Date: 11/29/2020 CLINICAL DATA:  Tachycardia and shortness of breath EXAM: PORTABLE CHEST 1 VIEW COMPARISON:  09/15/2019 FINDINGS: The heart size and mediastinal contours are within normal limits. Both lungs are clear. The visualized skeletal structures are unremarkable. IMPRESSION: No active disease. Electronically Signed   By: Alcide Clever M.D.   On: 11/29/2020 18:12    Pending Labs Unresulted Labs (From admission, onward)          Start     Ordered   12/06/20 0500  Creatinine, serum  (enoxaparin (LOVENOX)    CrCl >/= 30 ml/min)  Weekly,   R     Comments: while on enoxaparin therapy    11/29/20 2312   11/30/20 0500  Comprehensive metabolic panel  Tomorrow morning,   R        11/29/20 2312   11/30/20 0500  Magnesium  Tomorrow morning,   R        11/29/20 2312   11/30/20  0500  Phosphorus  Tomorrow morning,   R  11/29/20 2312   11/30/20 0500  CBC  Tomorrow morning,   R        11/29/20 2312   11/29/20 1849  Urine culture  ONCE - STAT,   STAT        11/29/20 1848   11/29/20 1827  Blood culture (routine x 2)  BLOOD CULTURE X 2,   STAT      11/29/20 1826          Vitals/Pain Today's Vitals   11/30/20 0000 11/30/20 0030 11/30/20 0100 11/30/20 0130  BP: 111/73 113/72 102/67 106/65  Pulse: 87 73 74 71  Resp: 16 19 16  (!) 24  Temp:      TempSrc:      SpO2: 96% 98% 93% 94%  PainSc:        Isolation Precautions No active isolations  Medications Medications  ketoconazole (NIZORAL) 2 % cream (has no administration in time range)  LORazepam (ATIVAN) tablet 1-4 mg (has no administration in time range)    Or  LORazepam (ATIVAN) injection 1-4 mg (has no administration in time range)  thiamine tablet 100 mg (has no administration in time range)    Or  thiamine (B-1) injection 100 mg (has no administration in time range)  folic acid (FOLVITE) tablet 1 mg (has no administration in time range)  multivitamin with minerals tablet 1 tablet (has no administration in time range)  LORazepam (ATIVAN) injection 0-4 mg (1 mg Intravenous Given 11/29/20 2347)    Followed by  LORazepam (ATIVAN) injection 0-4 mg (has no administration in time range)  enoxaparin (LOVENOX) injection 40 mg (has no administration in time range)  0.9 %  sodium chloride infusion (has no administration in time range)  acetaminophen (TYLENOL) tablet 325 mg (has no administration in time range)    Or  acetaminophen (TYLENOL) suppository 325 mg (has no administration in time range)  senna-docusate (Senokot-S) tablet 1 tablet (has no administration in time range)  Gerhardt's butt cream (has no administration in time range)  LORazepam (ATIVAN) injection 1 mg (1 mg Intravenous Given 11/29/20 1820)  sodium chloride 0.9 % bolus 1,000 mL (0 mLs Intravenous Stopped 11/29/20 2112)  ceFEPIme  (MAXIPIME) 2 g in sodium chloride 0.9 % 100 mL IVPB (0 g Intravenous Stopped 11/29/20 1931)  vancomycin (VANCOCIN) IVPB 1000 mg/200 mL premix (0 mg Intravenous Stopped 11/29/20 2112)  sodium chloride 0.9 % bolus 1,000 mL (0 mLs Intravenous Stopped 11/29/20 2113)  acetaminophen (TYLENOL) tablet 650 mg (650 mg Oral Given 11/29/20 1902)  magnesium sulfate IVPB 1 g 100 mL (0 g Intravenous Stopped 11/29/20 2113)  magnesium sulfate IVPB 1 g 100 mL (0 g Intravenous Stopped 11/30/20 0129)  iohexol (OMNIPAQUE) 300 MG/ML solution 100 mL (100 mLs Intravenous Contrast Given 11/30/20 0158)    Mobility walks with person assist Moderate fall risk   Focused Assessments Gen Med   R Recommendations: See Admitting Provider Note  Report given to:   Additional Notes: called to give report and nurse will call back

## 2020-12-01 LAB — CBC WITH DIFFERENTIAL/PLATELET
Abs Immature Granulocytes: 0.02 10*3/uL (ref 0.00–0.07)
Basophils Absolute: 0.1 10*3/uL (ref 0.0–0.1)
Basophils Relative: 1 %
Eosinophils Absolute: 0.3 10*3/uL (ref 0.0–0.5)
Eosinophils Relative: 6 %
HCT: 32.4 % — ABNORMAL LOW (ref 39.0–52.0)
Hemoglobin: 10.6 g/dL — ABNORMAL LOW (ref 13.0–17.0)
Immature Granulocytes: 0 %
Lymphocytes Relative: 27 %
Lymphs Abs: 1.5 10*3/uL (ref 0.7–4.0)
MCH: 30.8 pg (ref 26.0–34.0)
MCHC: 32.7 g/dL (ref 30.0–36.0)
MCV: 94.2 fL (ref 80.0–100.0)
Monocytes Absolute: 0.6 10*3/uL (ref 0.1–1.0)
Monocytes Relative: 12 %
Neutro Abs: 3 10*3/uL (ref 1.7–7.7)
Neutrophils Relative %: 54 %
Platelets: 120 10*3/uL — ABNORMAL LOW (ref 150–400)
RBC: 3.44 MIL/uL — ABNORMAL LOW (ref 4.22–5.81)
RDW: 16.3 % — ABNORMAL HIGH (ref 11.5–15.5)
WBC: 5.5 10*3/uL (ref 4.0–10.5)
nRBC: 0 % (ref 0.0–0.2)

## 2020-12-01 LAB — COMPREHENSIVE METABOLIC PANEL
ALT: 69 U/L — ABNORMAL HIGH (ref 0–44)
AST: 161 U/L — ABNORMAL HIGH (ref 15–41)
Albumin: 2.5 g/dL — ABNORMAL LOW (ref 3.5–5.0)
Alkaline Phosphatase: 255 U/L — ABNORMAL HIGH (ref 38–126)
Anion gap: 8 (ref 5–15)
BUN: 9 mg/dL (ref 6–20)
CO2: 19 mmol/L — ABNORMAL LOW (ref 22–32)
Calcium: 8.1 mg/dL — ABNORMAL LOW (ref 8.9–10.3)
Chloride: 100 mmol/L (ref 98–111)
Creatinine, Ser: 0.61 mg/dL (ref 0.61–1.24)
GFR, Estimated: >60 mL/min (ref >60)
Glucose, Bld: 103 mg/dL — ABNORMAL HIGH (ref 70–99)
Potassium: 2.9 mmol/L — ABNORMAL LOW (ref 3.5–5.1)
Sodium: 127 mmol/L — ABNORMAL LOW (ref 135–145)
Total Bilirubin: 1.6 mg/dL — ABNORMAL HIGH (ref 0.3–1.2)
Total Protein: 7.2 g/dL (ref 6.5–8.1)

## 2020-12-01 LAB — URINE CULTURE: Culture: NO GROWTH

## 2020-12-01 LAB — MAGNESIUM: Magnesium: 1.6 mg/dL — ABNORMAL LOW (ref 1.7–2.4)

## 2020-12-01 MED ORDER — ZINC OXIDE 40 % EX OINT
TOPICAL_OINTMENT | Freq: Two times a day (BID) | CUTANEOUS | Status: DC
  Filled 2020-12-01 (×2): qty 57

## 2020-12-01 MED ORDER — MAGNESIUM SULFATE 2 GM/50ML IV SOLN
2.0000 g | Freq: Once | INTRAVENOUS | Status: AC
  Administered 2020-12-01: 2 g via INTRAVENOUS
  Filled 2020-12-01: qty 50

## 2020-12-01 NOTE — Progress Notes (Signed)
Received a call from telemetry that pt. Had Ventricular tachycardia episodes which was brief and last only a couple of seconds. Pt was asymptomatic,Dr T Opyd was notified .will continue to monitor

## 2020-12-01 NOTE — Consult Note (Signed)
WOC Nurse Consult Note: Reason for Consult:Intertrigo to inguinal folds, including scrotum.  Patient has neglected personal hygiene lately he reports.  Is incontinent at times due to drinking. Reports feeling so much better after a good bath here. Condom cath in place.  Wound type:Moisture (incontinence) associated skin damage to inguinal folds and perineal area. Full thickness Pressure Injury POA: NA Measurement: erythema to entire area.  Tender and warm to touch.  0.1 cm nonintact pustules,  Wound bed:red and moist Drainage (amount, consistency, odor) scant weeping Periwound:nonblanchable erythema Dressing procedure/placement/frequency:Interdry to inguinal folds  Continue barrier cream to posterior scrotum and and buttocks.   Measure and cut length of InterDry to fit in skin folds that have skin breakdown Tuck InterDry fabric into skin folds in a single layer, allow for 2 inches of overhang from skin edges to allow for wicking to occur May remove to bathe; dry area thoroughly and then tuck into affected areas again Do not apply any creams or ointments when using InterDry DO NOT THROW AWAY FOR 5 DAYS unless soiled with stool DO NOT [facility] product, this will inactivate the silver in the material  New sheet of Interdry should be applied after 5 days of use if patient continues to have skin breakdown   Will not follow at this time.  Please re-consult if needed.  Maple Hudson MSN, RN, FNP-BC CWON Wound, Ostomy, Continence Nurse Pager [phone]

## 2020-12-01 NOTE — Evaluation (Signed)
Physical Therapy Evaluation Patient Details Name: Dylan Weaver MRN: [id number] DOB: [birth year] Today's Date: 12/01/2020   History of Present Illness  Dylan Weaver is a 55 y.o. male with medical history significant for alcohol dependence, chronic hyponatremia, and chronic elevation in LFTs, now presenting to the emergency department complaining of skin breakdown in the groin, ongoing alcohol problem, and general malaise.  Clinical Impression  Pt admitted with above diagnosis. PTA, pt living at home with elder mother requiring assistance to transfer back into bed, bed/bath on 2nd floor, independent with ambulation without AD use, driving and retired. Pt currently unsteady with HHA and with RW with transfers and ambulation demonstrating ataxic-like steps and posterior lean with mobility. Pt impulsive, requiring repeated cues for hand placement and safety to reduce risk for falls. Recommending SNF due to current assist level and unsteadiness increasing pt's risk for falls, hopeful pt with improve with ambulation and balance to d/c home with HHPT/OPPT if needed. Pt currently with functional limitations due to the deficits listed below (see PT Problem List). Pt will benefit from skilled PT to increase their independence and safety with mobility to allow discharge to the venue listed below.       Follow Up Recommendations SNF (possible HHPT pending progress in mobility)    Equipment Recommendations  None recommended by PT    Recommendations for Other Services       Precautions / Restrictions Precautions Precautions: Fall Restrictions Weight Bearing Restrictions: No      Mobility  Bed Mobility Overal bed mobility: Needs Assistance Bed Mobility: Supine to Sit  Supine to sit: Supervision;HOB elevated  General bed mobility comments: elevated HOB, use of bedrail and increased time due to pain to come to sitting EOB, no physical assistance    Transfers Overall transfer level: Needs  assistance Equipment used: Rolling walker (2 wheeled);None Transfers: Sit to/from Stand Sit to Stand: Min assist  General transfer comment: heavy posterior lean and BLE braced against bed requiring heavy cues and min A to shift weight anterior, initial rep unable to achieve upright standing due to LOB posterior due to heavy posterior lean with attempting to rise, generally unsteady throughout entire rise to stand with and without RW  Ambulation/Gait Ambulation/Gait assistance: Min assist Gait Distance (Feet): 30 Feet Assistive device: Rolling walker (2 wheeled);1 person hand held assist Gait Pattern/deviations: Step-through pattern;Decreased stride length;Narrow base of support;Ataxic;Drifts right/left  General Gait Details: Pt ambulates short distance with HHA in room, ataxic-like step progression and weight posterior requiring constant cues for posture and technique, maitnains narrow BOS and drifts R/L. Pt improves anterior weight shift and minimal improvement in drifting when ambulating with RW, but continues with ataxic-like step progression and requiring min A to maneuver RW due to running into objects in room. Pt impulsive with ambulation, attempting to ambulate before room set up and fair carryover with cues for form  Stairs            Wheelchair Mobility    Modified Rankin (Stroke Patients Only)       Balance Overall balance assessment: Needs assistance Sitting-balance support: Feet supported Sitting balance-Leahy Scale: Good Sitting balance - Comments: seated EOB   Standing balance support: During functional activity;Bilateral upper extremity supported;Single extremity supported Standing balance-Leahy Scale: Poor Standing balance comment: reliant on UE support        Pertinent Vitals/Pain Pain Assessment: Faces Faces Pain Scale: Hurts a little bit Pain Location: L hip Pain Descriptors / Indicators: Aching;Sore Pain Intervention(s): Limited activity within  patient's tolerance;Monitored during session;Repositioned    Home Living Family/patient expects to be discharged to:: Private residence Living Arrangements: Parent Available Help at Discharge: Family;Available 24 hours/day Type of Home: House Home Access: Stairs to enter Entrance Stairs-Rails: None Entrance Stairs-Number of Steps: 2 Home Layout: Two level;Bed/bath upstairs Home Equipment: Cane - single point;Walker - 2 wheels Additional Comments: RW and SPC are pt's mom's, but he can use if needed    Prior Function Level of Independence: Independent         Comments: Pt reports independent, retired, drives, mom lives on main level who he has to assist with getting back into bed.     Hand Dominance   Dominant Hand: Right    Extremity/Trunk Assessment   Upper Extremity Assessment Upper Extremity Assessment: Overall WFL for tasks assessed    Lower Extremity Assessment Lower Extremity Assessment: Overall WFL for tasks assessed (AROM WNL, strength 5/5 throughout BLE, denies numbness/tingling throughout BLE, normal and symmetrical heel to shin test bilaterally)    Cervical / Trunk Assessment Cervical / Trunk Assessment: Normal  Communication   Communication: No difficulties  Cognition Arousal/Alertness: Awake/alert Behavior During Therapy: WFL for tasks assessed/performed;Impulsive Overall Cognitive Status: No family/caregiver present to determine baseline cognitive functioning  General Comments: Pt impulsive with therapy, attempting to stand and ambulate before room set up, fair carry over with cues. Pt initially requiring increased time to follow commands but unsure if due to pain limiting reactions.      General Comments      Exercises     Assessment/Plan    PT Assessment Patient needs continued PT services  PT Problem List Decreased activity tolerance;Decreased balance;Decreased knowledge of use of DME;Decreased safety awareness;Pain       PT Treatment  Interventions DME instruction;Gait training;Stair training;Functional mobility training;Therapeutic activities;Therapeutic exercise;Balance training;Patient/family education    PT Goals (Current goals can be found in the Care Plan section)  Acute Rehab PT Goals Patient Stated Goal: home tomorrow PT Goal Formulation: With patient Time For Goal Achievement: 12/15/20 Potential to Achieve Goals: Good    Frequency Min 3X/week   Barriers to discharge        Co-evaluation               AM-PAC PT "6 Clicks" Mobility  Outcome Measure Help needed turning from your back to your side while in a flat bed without using bedrails?: None Help needed moving from lying on your back to sitting on the side of a flat bed without using bedrails?: None Help needed moving to and from a bed to a chair (including a wheelchair)?: A Little Help needed standing up from a chair using your arms (e.g., wheelchair or bedside chair)?: A Little Help needed to walk in hospital room?: A Little Help needed climbing 3-5 steps with a railing? : A Little 6 Click Score: 20    End of Session Equipment Utilized During Treatment: Gait belt Activity Tolerance: Patient tolerated treatment well Patient left: in chair;with call bell/phone within reach;with chair alarm set;with nursing/sitter in room Nurse Communication: Mobility status PT Visit Diagnosis: Unsteadiness on feet (R26.81);Other abnormalities of gait and mobility (R26.89)    Time: 3976-7341 PT Time Calculation (min) (ACUTE ONLY): 22 min   Charges:   PT Evaluation $PT Eval Low Complexity: 1 Low           Tori Jorge Retz PT, DPT 12/01/20, 12:26 PM

## 2020-12-01 NOTE — TOC Progression Note (Signed)
Transition of Care [facility]) - Progression Note    Patient Details  Name: Dylan Weaver MRN: [id number] Date of Birth: 1966-05-16  Transition of [facility] CM/SW Contact  Kariel Skillman, Olegario Messier, RN Phone Number: 12/01/2020, 2:24 PM  Clinical Narrative: PT recc SNF-patient in agreement to SNF-left vm w/[facility] Brianna Wiley [phone] [id number] will send info to start process for [facility] approval for SNF.      Expected Discharge Plan: Skilled Nursing Facility Barriers to Discharge: Continued Medical Work up  Expected Discharge Plan and Services Expected Discharge Plan: Skilled Nursing Facility                                               Social Determinants of Health (SDOH) Interventions    Readmission Risk Interventions No flowsheet data found.

## 2020-12-01 NOTE — Progress Notes (Addendum)
PHARMACY - PHYSICIAN COMMUNICATION CRITICAL VALUE ALERT - BLOOD CULTURE IDENTIFICATION (BCID)  Dylan Weaver is an 54 y.o. male who presented to [facility] on 11/29/2020 with a chief complaint of generalized weakness and fever. He received one dose of vancomycin and cefepime in the ED and started on doxycycline on admission for empiric coverage. One of 4 blood cx bottles collected on 2/28 has GPR.   Name of physician (or Provider) Contacted: Dr. Shanda Bumps  Current antibiotics: doxycycline  Changes to prescribed antibiotics recommended:  -This is likely a contaminant. Recommend to hold off on treatment. Informed Dr. Shanda Bumps.   No results found for this or any previous visit.  [name] 12/01/2020  9:18 AM

## 2020-12-01 NOTE — Progress Notes (Addendum)
PROGRESS NOTE    Dylan Weaver  WUJ:[id number] DOB: January 05, 1966 DOA: 11/29/2020 PCP: Patient, No Pcp Per   Brief Narrative: Taken from prior notes. 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  Subjective: Patient continued to experience some pain around buttock and groin area with movement, stating that when he is sitting still there is no pain.  Assessment & Plan:   Principal Problem:   SIRS (systemic inflammatory response syndrome) (HCC) Active Problems:   Alcohol use disorder, severe, dependence (HCC)   Thrombocytopenia (HCC)   Elevated LFTs   Intertrigo   Prolonged QT interval  Fungal dermatitis.  Exam consistent with intertrigo involving the groin, scrotum and buttock area with some skin damage.  Wound care was also consulted. Patient was febrile on presentation, he was given cefepime and vancomycin.  1 out of 4 blood cultures with gram-positive rods most likely a contaminant.  Urine cultures negative. Remained afebrile-was started on doxycycline. -Discontinue doxycycline. -Continue with Gerhart's  and ketoconazole cream  Chronic hyponatremia.  Secondary to alcohol abuse. Sodium appears to be around his baseline.  History of alcohol dependence. -Continue with CIWA protocol -Continue with thiamine and folic acid supplement  Abnormal liver enzymes.  Most likely secondary to alcohol abuse.  Ultrasound consistent with hepatic steatosis.   -Continue to monitor  Physical deconditioning. -PT is recommending SNF placement.  Hypokalemia and hypomagnesemia. -Replete electrolytes and monitor.  Normocytic anemia.  Seems chronic and hemoglobin stable close to his baseline.   Objective: Vitals:    12/01/20 0030 12/01/20 0519 12/01/20 0526 12/01/20 1300  BP:  118/83 118/83 126/85  Pulse: 82 62 62 75  Resp:  18  (!) 22  Temp:  97.6 F (36.4 C)  97.8 F (36.6 C)  TempSrc:  Oral  Oral  SpO2:  100%  98%  Weight:      Height:        Intake/Output Summary (Last 24 hours) at 12/01/2020 1736 Last data filed at 12/01/2020 1700 Gross per 24 hour  Intake 543.36 ml  Output 1600 ml  Net -1056.64 ml   Filed Weights   11/30/20 0252  Weight: 76.5 kg    Examination:  General exam: Appears calm and comfortable  Respiratory system: Clear to auscultation. Respiratory effort normal. Cardiovascular system: S1 & S2 heard, RRR. No JVD, murmurs, rubs, gallops or clicks. Gastrointestinal system: Soft, nontender, nondistended, bowel sounds positive. Central nervous system: Alert and oriented. No focal neurological deficits. Extremities: No edema, no cyanosis, pulses intact and symmetrical. Skin: Intertrigo involving groin, scrotum and buttock area. Psychiatry: Judgement and insight appear normal.     DVT prophylaxis: Lovenox Code Status: Full Family Communication: Discussed with patient Disposition Plan:  Status is: Inpatient  Remains inpatient appropriate because:Inpatient level of care appropriate due to severity of illness   Dispo: The patient is from: Home              Anticipated d/c is to: SNF              Patient currently is medically stable to d/c.   Difficult to place patient No               Level of care: Telemetry  All the records are reviewed and case discussed with Care Management/Social Worker. Management plans discussed  with the patient, nursing and they are in agreement.  Consultants:   None  Procedures:  Antimicrobials:   Data Reviewed: I have personally reviewed following labs and imaging studies  CBC: Recent Labs  Lab 11/29/20 1845 11/30/20 0450 12/01/20 0450  WBC 11.2* 7.1 5.5  NEUTROABS 9.2*  --  3.0  HGB 12.9* 11.4* 10.6*  HCT 39.3 35.4* 32.4*   MCV 91.2 93.7 94.2  PLT 123* 105* 120*   Basic Metabolic Panel: Recent Labs  Lab 11/29/20 1845 11/30/20 0450 12/01/20 0450  NA 131* 134* 127*  K 4.3 3.0* 2.9*  CL 98 104 100  CO2 19* 21* 19*  GLUCOSE 114* 88 103*  BUN 17 10 9   CREATININE 0.81 0.63 0.61  CALCIUM 9.1 8.4* 8.1*  MG 1.8 2.2 1.6*  PHOS  --  2.1*  --    GFR: Estimated Creatinine Clearance: 114.2 mL/min (by C-G formula based on SCr of 0.61 mg/dL). Liver Function Tests: Recent Labs  Lab 11/29/20 1845 11/30/20 0450 12/01/20 0450  AST 167* 126* 161*  ALT 69* 55* 69*  ALKPHOS 318* 268* 255*  BILITOT 3.4* 2.6* 1.6*  PROT 10.2* 8.2* 7.2  ALBUMIN 3.4* 2.8* 2.5*   No results for input(s): LIPASE, AMYLASE in the last 168 hours. Recent Labs  Lab 11/29/20 2305  AMMONIA 49*   Coagulation Profile: Recent Labs  Lab 11/29/20 1845  INR 1.0   Cardiac Enzymes: Recent Labs  Lab 11/29/20 1845  CKTOTAL 82   BNP (last 3 results) No results for input(s): PROBNP in the last 8760 hours. HbA1C: No results for input(s): HGBA1C in the last 72 hours. CBG: No results for input(s): GLUCAP in the last 168 hours. Lipid Profile: No results for input(s): CHOL, HDL, LDLCALC, TRIG, CHOLHDL, LDLDIRECT in the last 72 hours. Thyroid Function Tests: No results for input(s): TSH, T4TOTAL, FREET4, T3FREE, THYROIDAB in the last 72 hours. Anemia Panel: No results for input(s): VITAMINB12, FOLATE, FERRITIN, TIBC, IRON, RETICCTPCT in the last 72 hours. Sepsis Labs: Recent Labs  Lab 11/29/20 1845 11/29/20 2305  PROCALCITON  --  0.28  LATICACIDVEN 1.8  --     Recent Results (from the past 240 hour(s))  Resp Panel by RT-PCR (Flu A&B, Covid) Nasopharyngeal Swab     Status: None   Collection Time: 11/29/20  6:27 PM   Specimen: Nasopharyngeal Swab; Nasopharyngeal(NP) swabs in vial transport medium  Result Value Ref Range Status   SARS Coronavirus 2 by RT PCR NEGATIVE NEGATIVE Final    Comment: (NOTE) SARS-CoV-2 target nucleic  acids are NOT DETECTED.  The SARS-CoV-2 RNA is generally detectable in upper respiratory specimens during the acute phase of infection. The lowest concentration of SARS-CoV-2 viral copies this assay can detect is 138 copies/mL. A negative result does not preclude SARS-Cov-2 infection and should not be used as the sole basis for treatment or other patient management decisions. A negative result may occur with  improper specimen collection/handling, submission of specimen other than nasopharyngeal swab, presence of viral mutation(s) within the areas targeted by this assay, and inadequate number of viral copies(<138 copies/mL). A negative result must be combined with clinical observations, patient history, and epidemiological information. The expected result is Negative.  Fact Sheet for Patients:  12/01/20  Fact Sheet for Healthcare Providers:  BloggerCourse.com  This test is no t yet approved or cleared by the [employer] FDA and  has been authorized for detection and/or diagnosis of SARS-CoV-2 by FDA under an Emergency Use Authorization (EUA). This EUA will  remain  in effect (meaning this test can be used) for the duration of the COVID-19 declaration under Section 564(b)(1) of the Act, 21 U.S.C.section 360bbb-3(b)(1), unless the authorization is terminated  or revoked sooner.       Influenza A by PCR NEGATIVE NEGATIVE Final   Influenza B by PCR NEGATIVE NEGATIVE Final    Comment: (NOTE) The Xpert Xpress SARS-CoV-2/FLU/RSV plus assay is intended as an aid in the diagnosis of influenza from Nasopharyngeal swab specimens and should not be used as a sole basis for treatment. Nasal washings and aspirates are unacceptable for Xpert Xpress SARS-CoV-2/FLU/RSV testing.  Fact Sheet for Patients: BloggerCourse.com  Fact Sheet for Healthcare Providers: SeriousBroker.it  This  test is not yet approved or cleared by the [place] FDA and has been authorized for detection and/or diagnosis of SARS-CoV-2 by FDA under an Emergency Use Authorization (EUA). This EUA will remain in effect (meaning this test can be used) for the duration of the COVID-19 declaration under Section 564(b)(1) of the Act, 21 U.S.C. section 360bbb-3(b)(1), unless the authorization is terminated or revoked.  Performed at [facility], [address], [place], [place] [address]   Blood culture (routine x 2)     Status: None (Preliminary result)   Collection Time: 11/29/20  6:45 PM   Specimen: BLOOD  Result Value Ref Range Status   Specimen Description   Final    BLOOD BLOOD RIGHT HAND Performed at [facility], [address] [place], [place] [address]    Special Requests   Final    BOTTLES DRAWN AEROBIC AND ANAEROBIC Blood Culture adequate volume Performed at [facility], [address], [place], [place] [address]    Culture  Setup Time   Final    GRAM POSITIVE RODS AEROBIC BOTTLE ONLY CRITICAL RESULT CALLED TO, READ BACK BY AND VERIFIED WITH: Azzie Glatter PHARMD, AT 9147 12/01/20 BY Renato Shin Performed at [facility], [address] [place], [place] [address]    Culture GRAM POSITIVE RODS  Final   Report Status PENDING  Incomplete  Blood culture (routine x 2)     Status: None (Preliminary result)   Collection Time: 11/29/20  6:50 PM   Specimen: BLOOD  Result Value Ref Range Status   Specimen Description   Final    BLOOD BLOOD RIGHT FOREARM Performed at [facility], [address] [place], [place] [address]    Special Requests   Final    BOTTLES DRAWN AEROBIC AND ANAEROBIC Blood Culture adequate volume Performed at [facility], [address], [place], [place] [address]    Culture   Final    NO GROWTH 2 DAYS Performed at [facility], [address],  [place], [place] [address]    Report Status PENDING  Incomplete  Urine culture     Status: None   Collection Time: 11/29/20  9:13 PM   Specimen: Urine, Random  Result Value Ref Range Status   Specimen Description   Final    URINE, RANDOM Performed at [facility], [address], [place], [place] [address]    Special Requests   Final    NONE Performed at [facility], [address] [place], [place] [address]    Culture   Final    NO GROWTH Performed at [facility], [address] [place], [place] [address]    Report Status 12/01/2020 FINAL  Final  Radiology Studies: CT PELVIS W CONTRAST  Result Date: 11/30/2020 CLINICAL DATA:  Soft tissue infection suspected. Skin breakdown around buttocks x1 month. EXAM: CT PELVIS WITH CONTRAST TECHNIQUE: Multidetector CT imaging of the pelvis was performed using the standard protocol following the bolus administration of intravenous contrast. CONTRAST:  OMNIPAQUE IOHEXOL 300 MG/ML  SOLN COMPARISON:  None. FINDINGS: Urinary Tract:  No abnormality visualized. Bowel: There are scattered colonic diverticula without CT evidence for diverticulitis involving the visualized portions of the colon. The appendix is unremarkable. Vascular/Lymphatic: There mild vascular calcifications of the visualized abdominal aorta without evidence for an aneurysm. There are enlarged bilateral inguinal lymph nodes. Reproductive:  No mass or other significant abnormality Other:  None. Musculoskeletal: No suspicious bone lesions identified. IMPRESSION: 1. Enlarged bilateral inguinal lymph nodes. These are nonspecific and may be reactive in etiology. 2. No abscess.  No pockets of subcutaneous gas. 3. Scattered colonic diverticula without CT evidence for diverticulitis involving the visualized portions of the colon. Aortic Atherosclerosis (ICD10-I70.0). Electronically Signed   By: Katherine Mantle M.D.   On: 11/30/2020 02:33   US  Abdomen Limited  Result Date: 11/29/2020 CLINICAL DATA:  Right upper quadrant pain, transaminitis, fever EXAM: ULTRASOUND ABDOMEN LIMITED RIGHT UPPER QUADRANT COMPARISON:  None. FINDINGS: Gallbladder: No gallstones or wall thickening visualized. No sonographic Murphy sign noted by sonographer. Common bile duct: Diameter: 4 mm Liver: Liver demonstrates diffuse increased echotexture, consistent with hepatic steatosis. No focal abnormality or biliary dilation. Portal vein is patent on color Doppler imaging with normal direction of blood flow towards the liver. Other: None. IMPRESSION: 1. Increased liver echotexture consistent with hepatic steatosis. Electronically Signed   By: Sharlet Salina M.D.   On: 11/29/2020 20:45   DG Chest Portable 1 View  Result Date: 11/29/2020 CLINICAL DATA:  Tachycardia and shortness of breath EXAM: PORTABLE CHEST 1 VIEW COMPARISON:  09/15/2019 FINDINGS: The heart size and mediastinal contours are within normal limits. Both lungs are clear. The visualized skeletal structures are unremarkable. IMPRESSION: No active disease. Electronically Signed   By: Alcide Clever M.D.   On: 11/29/2020 18:12    Scheduled Meds: . enoxaparin (LOVENOX) injection  40 mg Subcutaneous Q24H  . folic acid  1 mg Oral Daily  . Gerhardt's butt cream   Topical BID  . ketoconazole   Topical BID  . liver oil-zinc oxide   Topical BID  . LORazepam  0-4 mg Intravenous Q6H   Followed by  . [START ON 12/02/2020] LORazepam  0-4 mg Intravenous Q12H  . multivitamin with minerals  1 tablet Oral QAC breakfast  . potassium chloride  40 mEq Oral BID  . thiamine  100 mg Oral Daily   Or  . thiamine  100 mg Intravenous Daily   Continuous Infusions: . sodium chloride 250 mL (12/01/20 1030)     LOS: 1 day   Time spent: 35 minutes. More than 50% of the time was spent in counseling/coordination of care  Arnetha Courser, MD [facility]  If 7PM-7AM, please contact night-coverage [email or web address]  12/01/2020,  5:36 PM   This record has been created using Conservation officer, historic buildings. Errors have been sought and corrected,but may not always be located. Such creation errors do not reflect on the standard of care.

## 2020-12-02 LAB — CBC WITH DIFFERENTIAL/PLATELET
Abs Immature Granulocytes: 0.02 10*3/uL (ref 0.00–0.07)
Basophils Absolute: 0.1 10*3/uL (ref 0.0–0.1)
Basophils Relative: 1 %
Eosinophils Absolute: 0.3 10*3/uL (ref 0.0–0.5)
Eosinophils Relative: 6 %
HCT: 32.7 % — ABNORMAL LOW (ref 39.0–52.0)
Hemoglobin: 11 g/dL — ABNORMAL LOW (ref 13.0–17.0)
Immature Granulocytes: 0 %
Lymphocytes Relative: 27 %
Lymphs Abs: 1.3 10*3/uL (ref 0.7–4.0)
MCH: 30.7 pg (ref 26.0–34.0)
MCHC: 33.6 g/dL (ref 30.0–36.0)
MCV: 91.3 fL (ref 80.0–100.0)
Monocytes Absolute: 0.7 10*3/uL (ref 0.1–1.0)
Monocytes Relative: 14 %
Neutro Abs: 2.4 10*3/uL (ref 1.7–7.7)
Neutrophils Relative %: 52 %
Platelets: 147 10*3/uL — ABNORMAL LOW (ref 150–400)
RBC: 3.58 MIL/uL — ABNORMAL LOW (ref 4.22–5.81)
RDW: 16.4 % — ABNORMAL HIGH (ref 11.5–15.5)
WBC: 4.7 10*3/uL (ref 4.0–10.5)
nRBC: 0 % (ref 0.0–0.2)

## 2020-12-02 LAB — COMPREHENSIVE METABOLIC PANEL
ALT: 65 U/L — ABNORMAL HIGH (ref 0–44)
AST: 106 U/L — ABNORMAL HIGH (ref 15–41)
Albumin: 2.8 g/dL — ABNORMAL LOW (ref 3.5–5.0)
Alkaline Phosphatase: 240 U/L — ABNORMAL HIGH (ref 38–126)
Anion gap: 7 (ref 5–15)
BUN: 7 mg/dL (ref 6–20)
CO2: 22 mmol/L (ref 22–32)
Calcium: 8.8 mg/dL — ABNORMAL LOW (ref 8.9–10.3)
Chloride: 102 mmol/L (ref 98–111)
Creatinine, Ser: 0.61 mg/dL (ref 0.61–1.24)
GFR, Estimated: >60 mL/min (ref >60)
Glucose, Bld: 99 mg/dL (ref 70–99)
Potassium: 3.7 mmol/L (ref 3.5–5.1)
Sodium: 131 mmol/L — ABNORMAL LOW (ref 135–145)
Total Bilirubin: 1.2 mg/dL (ref 0.3–1.2)
Total Protein: 7.7 g/dL (ref 6.5–8.1)

## 2020-12-02 LAB — MAGNESIUM: Magnesium: 1.5 mg/dL — ABNORMAL LOW (ref 1.7–2.4)

## 2020-12-02 MED ORDER — MAGNESIUM SULFATE 4 GM/100ML IV SOLN
4.0000 g | Freq: Once | INTRAVENOUS | Status: AC
  Administered 2020-12-02: 4 g via INTRAVENOUS
  Filled 2020-12-02: qty 100

## 2020-12-02 MED ORDER — MAGNESIUM OXIDE 400 (241.3 MG) MG PO TABS
400.0000 mg | ORAL_TABLET | Freq: Every day | ORAL | Status: DC
  Administered 2020-12-03 – 2020-12-04 (×2): 400 mg via ORAL
  Filled 2020-12-02 (×2): qty 1

## 2020-12-02 NOTE — Progress Notes (Signed)
PROGRESS NOTE    [name] Wheelwright  EVO:[id number] DOB: 1966-07-28 DOA: 11/29/2020 PCP: Patient, No Pcp Per   Brief Narrative: Taken from prior notes. 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  PT recommended SNF placement but patient wants to go home with home health services which were ordered.  Subjective: Patient continued to have some pain in buttock area but stating it is improving.  He wants to stay in hospital for 1 more day and refusing to go to SNF stating that he has multiple things to take care at this time.  Lives with his mom.  Assessment & Plan:   Principal Problem:   SIRS (systemic inflammatory response syndrome) (HCC) Active Problems:   Alcohol use disorder, severe, dependence (HCC)   Thrombocytopenia (HCC)   Elevated LFTs   Intertrigo   Prolonged QT interval  Fungal dermatitis.  Exam consistent with intertrigo involving the groin, scrotum and buttock area with some skin damage.  Wound care was also consulted. Patient was febrile on presentation, he was given cefepime and vancomycin.  1 out of 4 blood cultures with gram-positive rods most likely a contaminant.  Urine cultures negative. Remained afebrile-was started on doxycycline. -Discontinue doxycycline. -Continue with Gerhart's  and ketoconazole cream  Chronic hyponatremia.  Secondary to alcohol abuse. Sodium appears to be around his baseline.  History of alcohol dependence. -Continue with CIWA protocol -Continue with thiamine and folic acid supplement  Abnormal liver enzymes.  Most likely secondary to alcohol abuse.  Ultrasound consistent with hepatic steatosis.  Liver enzymes improving. -Continue to monitor  Physical  deconditioning. -PT is recommending SNF placement.  Hypokalemia and hypomagnesemia. -Replete electrolytes and monitor.  Normocytic anemia.  Seems chronic and hemoglobin stable close to his baseline.   Objective: Vitals:   12/01/20 1300 12/01/20 1956 12/02/20 0509 12/02/20 1321  BP: 126/85 (!) 138/91 139/87 131/87  Pulse: 75 61 66 63  Resp: (!) 22 16 15 16   Temp: 97.8 F (36.6 C) 97.7 F (36.5 C) 98.3 F (36.8 C)   TempSrc: Oral Oral Oral   SpO2: 98% 98% 95% 98%  Weight:      Height:        Intake/Output Summary (Last 24 hours) at 12/02/2020 1534 Last data filed at 12/02/2020 1230 Gross per 24 hour  Intake 360 ml  Output 2450 ml  Net -2090 ml   Filed Weights   11/30/20 0252  Weight: 76.5 kg    Examination:  General.  Well-developed gentleman, in no acute distress. Pulmonary.  Lungs clear bilaterally, normal respiratory effort. CV.  Regular rate and rhythm, no JVD, rub or murmur. Abdomen.  Soft, nontender, nondistended, BS positive. CNS.  Alert and oriented x3.  No focal neurologic deficit. Extremities.  No edema, no cyanosis, pulses intact and symmetrical. Psychiatry.  Judgment and insight appears normal.   DVT prophylaxis: Lovenox Code Status: Full Family Communication: Discussed with patient Disposition Plan:  Status is: Inpatient  Remains inpatient appropriate because:Inpatient level of care appropriate due to severity of illness   Dispo: The patient is from: Home              Anticipated d/c is to: Home with home health.              Patient currently is medically stable to d/c.  Difficult to place patient No               Level of care: Telemetry  All the records are reviewed and case discussed with Care Management/Social Worker. Management plans discussed with the patient, nursing and they are in agreement.  Consultants:   None  Procedures:  Antimicrobials:   Data Reviewed: I have personally reviewed following labs and imaging  studies  CBC: Recent Labs  Lab 11/29/20 1845 11/30/20 0450 12/01/20 0450 12/02/20 0503  WBC 11.2* 7.1 5.5 4.7  NEUTROABS 9.2*  --  3.0 2.4  HGB 12.9* 11.4* 10.6* 11.0*  HCT 39.3 35.4* 32.4* 32.7*  MCV 91.2 93.7 94.2 91.3  PLT 123* 105* 120* 147*   Basic Metabolic Panel: Recent Labs  Lab 11/29/20 1845 11/30/20 0450 12/01/20 0450 12/02/20 0503  NA 131* 134* 127* 131*  K 4.3 3.0* 2.9* 3.7  CL 98 104 100 102  CO2 19* 21* 19* 22  GLUCOSE 114* 88 103* 99  BUN 17 10 9 7   CREATININE 0.81 0.63 0.61 0.61  CALCIUM 9.1 8.4* 8.1* 8.8*  MG 1.8 2.2 1.6* 1.5*  PHOS  --  2.1*  --   --    GFR: Estimated Creatinine Clearance: 114.2 mL/min (by C-G formula based on SCr of 0.61 mg/dL). Liver Function Tests: Recent Labs  Lab 11/29/20 1845 11/30/20 0450 12/01/20 0450 12/02/20 0503  AST 167* 126* 161* 106*  ALT 69* 55* 69* 65*  ALKPHOS 318* 268* 255* 240*  BILITOT 3.4* 2.6* 1.6* 1.2  PROT 10.2* 8.2* 7.2 7.7  ALBUMIN 3.4* 2.8* 2.5* 2.8*   No results for input(s): LIPASE, AMYLASE in the last 168 hours. Recent Labs  Lab 11/29/20 2305  AMMONIA 49*   Coagulation Profile: Recent Labs  Lab 11/29/20 1845  INR 1.0   Cardiac Enzymes: Recent Labs  Lab 11/29/20 1845  CKTOTAL 82   BNP (last 3 results) No results for input(s): PROBNP in the last 8760 hours. HbA1C: No results for input(s): HGBA1C in the last 72 hours. CBG: No results for input(s): GLUCAP in the last 168 hours. Lipid Profile: No results for input(s): CHOL, HDL, LDLCALC, TRIG, CHOLHDL, LDLDIRECT in the last 72 hours. Thyroid Function Tests: No results for input(s): TSH, T4TOTAL, FREET4, T3FREE, THYROIDAB in the last 72 hours. Anemia Panel: No results for input(s): VITAMINB12, FOLATE, FERRITIN, TIBC, IRON, RETICCTPCT in the last 72 hours. Sepsis Labs: Recent Labs  Lab 11/29/20 1845 11/29/20 2305  PROCALCITON  --  0.28  LATICACIDVEN 1.8  --     Recent Results (from the past 240 hour(s))  Resp Panel by  RT-PCR (Flu A&B, Covid) Nasopharyngeal Swab     Status: None   Collection Time: 11/29/20  6:27 PM   Specimen: Nasopharyngeal Swab; Nasopharyngeal(NP) swabs in vial transport medium  Result Value Ref Range Status   SARS Coronavirus 2 by RT PCR NEGATIVE NEGATIVE Final    Comment: (NOTE) SARS-CoV-2 target nucleic acids are NOT DETECTED.  The SARS-CoV-2 RNA is generally detectable in upper respiratory specimens during the acute phase of infection. The lowest concentration of SARS-CoV-2 viral copies this assay can detect is 138 copies/mL. A negative result does not preclude SARS-Cov-2 infection and should not be used as the sole basis for treatment or other patient management decisions. A negative result may occur with  improper specimen collection/handling, submission of specimen other than nasopharyngeal swab, presence of viral mutation(s) within the areas targeted by this assay, and inadequate number of viral copies(<138 copies/mL). A negative  result must be combined with clinical observations, patient history, and epidemiological information. The expected result is Negative.  Fact Sheet for Patients:  BloggerCourse.com  Fact Sheet for Healthcare Providers:  SeriousBroker.it  This test is no t yet approved or cleared by the [place] FDA and  has been authorized for detection and/or diagnosis of SARS-CoV-2 by FDA under an Emergency Use Authorization (EUA). This EUA will remain  in effect (meaning this test can be used) for the duration of the COVID-19 declaration under Section 564(b)(1) of the Act, 21 U.S.C.section 360bbb-3(b)(1), unless the authorization is terminated  or revoked sooner.       Influenza A by PCR NEGATIVE NEGATIVE Final   Influenza B by PCR NEGATIVE NEGATIVE Final    Comment: (NOTE) The Xpert Xpress SARS-CoV-2/FLU/RSV plus assay is intended as an aid in the diagnosis of influenza from Nasopharyngeal swab  specimens and should not be used as a sole basis for treatment. Nasal washings and aspirates are unacceptable for Xpert Xpress SARS-CoV-2/FLU/RSV testing.  Fact Sheet for Patients: BloggerCourse.com  Fact Sheet for Healthcare Providers: SeriousBroker.it  This test is not yet approved or cleared by the [place] FDA and has been authorized for detection and/or diagnosis of SARS-CoV-2 by FDA under an Emergency Use Authorization (EUA). This EUA will remain in effect (meaning this test can be used) for the duration of the COVID-19 declaration under Section 564(b)(1) of the Act, 21 U.S.C. section 360bbb-3(b)(1), unless the authorization is terminated or revoked.  Performed at [facility], [address] [place], [place] [address]   Blood culture (routine x 2)     Status: None (Preliminary result)   Collection Time: 11/29/20  6:45 PM   Specimen: BLOOD  Result Value Ref Range Status   Specimen Description   Final    BLOOD BLOOD RIGHT HAND Performed at [facility], [address] [place], [place] [address]    Special Requests   Final    BOTTLES DRAWN AEROBIC AND ANAEROBIC Blood Culture adequate volume Performed at [facility], [address] [place], [place] [address]    Culture  Setup Time   Final    GRAM POSITIVE RODS AEROBIC BOTTLE ONLY CRITICAL RESULT CALLED TO, READ BACK BY AND VERIFIED WITH: Azzie Glatter PHARMD, AT 4818 12/01/20 BY Renato Shin Performed at [facility], [address] [place], [place] [address]    Culture GRAM POSITIVE RODS  Final   Report Status PENDING  Incomplete  Blood culture (routine x 2)     Status: None (Preliminary result)   Collection Time: 11/29/20  6:50 PM   Specimen: BLOOD  Result Value Ref Range Status   Specimen Description   Final    BLOOD BLOOD RIGHT FOREARM Performed at [facility], [address] [place], [place] [address]    Special Requests   Final    BOTTLES DRAWN AEROBIC AND ANAEROBIC Blood Culture adequate volume Performed at [facility], [address] [place], [place] [address]    Culture   Final    NO GROWTH 3 DAYS Performed at [facility], [address] [place], [place] [address]    Report Status PENDING  Incomplete  Urine culture     Status: None   Collection Time: 11/29/20  9:13 PM   Specimen: Urine, Random  Result Value Ref Range Status   Specimen Description   Final    URINE, RANDOM Performed at [facility], [address], [place], [place] [address]    Special Requests   Final    NONE Performed at [facility], [address] [place], [place] [address]    Culture   Final    NO GROWTH Performed at [facility], [address], [place], [place] [address]    Report Status 12/01/2020 FINAL  Final     Radiology Studies: No results found.  Scheduled Meds: . enoxaparin (LOVENOX) injection  40 mg Subcutaneous Q24H  . folic acid  1 mg Oral Daily  . Gerhardt's butt cream   Topical BID  . ketoconazole   Topical BID  . liver oil-zinc oxide   Topical BID  . LORazepam  0-4 mg Intravenous Q12H  . [START ON 12/03/2020] magnesium oxide  400 mg Oral Daily  . multivitamin with minerals  1 tablet Oral QAC breakfast  . potassium chloride  40 mEq Oral BID  . thiamine  100 mg Oral Daily   Or  . thiamine  100 mg Intravenous Daily   Continuous Infusions: . sodium chloride 10 mL/hr at 12/02/20 1003     LOS: 2 days   Time spent: 25 minutes. More than 50% of the time was spent in counseling/coordination of care  Arnetha Courser, MD [facility]  If 7PM-7AM, please contact night-coverage [email or web address]  12/02/2020, 3:34 PM   This record has been created using Conservation officer, historic buildings. Errors have been sought and corrected,but may not always be located. Such creation errors do  not reflect on the standard of care.

## 2020-12-02 NOTE — TOC Progression Note (Signed)
Transition of Care [facility]) - Progression Note    Patient Details  Name: Dylan Weaver MRN: [id number] Date of Birth: 04-Jul-1966  Transition of Care [facility]) CM/SW Contact  Hooper Petteway, Olegario Messier, RN Phone Number: 12/02/2020, 11:45 AM  Clinical Narrative: [facility] rep Kenzie following-HHRN/PT/OT. Awaiting orders to send to [facility] for auth.      Expected Discharge Plan: Home w Home Health Services Barriers to Discharge: Continued Medical Work up  Expected Discharge Plan and Services Expected Discharge Plan: Home w Home Health Services   Discharge Planning Services: CM Consult Post Acute Care Choice: Home Health Living arrangements for the past 2 months: Single Family Home                           HH Arranged: RN,OT,PT HH Agency: [facility] Date HH Agency Contacted: 12/02/20 Time HH Agency Contacted: 1144 Representative spoke with at [facility] Agency: Pearson Grippe   Social Determinants of Health (SDOH) Interventions    Readmission Risk Interventions No flowsheet data found.

## 2020-12-02 NOTE — TOC Progression Note (Addendum)
Transition of Care [facility]) - Progression Note    Patient Details  Name: Dylan Weaver MRN: [id number] Date of Birth: March 13, 1966  Transition of Care [facility]) CM/SW Contact  Rickita Forstner, Olegario Messier, RN Phone Number: 12/02/2020, 11:39 AM  Clinical Narrative:  Sherron Monday to patient about d/c plans for SNF vs HHC-patient declines SNF wants Home w/HHC-[facility] rep Pearson Grippe can accept-will inform [facility] Brianna-will need HHC orders-HHRN-wound care/PT/OT-MD aware. Will fax orders to [facility] fax#[phone] once provided.Patient has own transport home.     Expected Discharge Plan: Home w Home Health Services Barriers to Discharge: Continued Medical Work up  Expected Discharge Plan and Services Expected Discharge Plan: Home w Home Health Services                                               Social Determinants of Health (SDOH) Interventions    Readmission Risk Interventions No flowsheet data found.

## 2020-12-03 LAB — COMPREHENSIVE METABOLIC PANEL
ALT: 57 U/L — ABNORMAL HIGH (ref 0–44)
AST: 73 U/L — ABNORMAL HIGH (ref 15–41)
Albumin: 2.8 g/dL — ABNORMAL LOW (ref 3.5–5.0)
Alkaline Phosphatase: 223 U/L — ABNORMAL HIGH (ref 38–126)
Anion gap: 8 (ref 5–15)
BUN: 9 mg/dL (ref 6–20)
CO2: 21 mmol/L — ABNORMAL LOW (ref 22–32)
Calcium: 8.7 mg/dL — ABNORMAL LOW (ref 8.9–10.3)
Chloride: 101 mmol/L (ref 98–111)
Creatinine, Ser: 0.7 mg/dL (ref 0.61–1.24)
GFR, Estimated: >60 mL/min (ref >60)
Glucose, Bld: 99 mg/dL (ref 70–99)
Potassium: 3.7 mmol/L (ref 3.5–5.1)
Sodium: 130 mmol/L — ABNORMAL LOW (ref 135–145)
Total Bilirubin: 1 mg/dL (ref 0.3–1.2)
Total Protein: 7.8 g/dL (ref 6.5–8.1)

## 2020-12-03 LAB — MAGNESIUM: Magnesium: 1.8 mg/dL (ref 1.7–2.4)

## 2020-12-03 LAB — CBC WITH DIFFERENTIAL/PLATELET
Abs Immature Granulocytes: 0.02 10*3/uL (ref 0.00–0.07)
Basophils Absolute: 0.1 10*3/uL (ref 0.0–0.1)
Basophils Relative: 1 %
Eosinophils Absolute: 0.3 10*3/uL (ref 0.0–0.5)
Eosinophils Relative: 5 %
HCT: 34.4 % — ABNORMAL LOW (ref 39.0–52.0)
Hemoglobin: 11.3 g/dL — ABNORMAL LOW (ref 13.0–17.0)
Immature Granulocytes: 0 %
Lymphocytes Relative: 25 %
Lymphs Abs: 1.3 10*3/uL (ref 0.7–4.0)
MCH: 30.3 pg (ref 26.0–34.0)
MCHC: 32.8 g/dL (ref 30.0–36.0)
MCV: 92.2 fL (ref 80.0–100.0)
Monocytes Absolute: 0.8 10*3/uL (ref 0.1–1.0)
Monocytes Relative: 15 %
Neutro Abs: 2.7 10*3/uL (ref 1.7–7.7)
Neutrophils Relative %: 54 %
Platelets: 187 10*3/uL (ref 150–400)
RBC: 3.73 MIL/uL — ABNORMAL LOW (ref 4.22–5.81)
RDW: 17.2 % — ABNORMAL HIGH (ref 11.5–15.5)
WBC: 5.1 10*3/uL (ref 4.0–10.5)
nRBC: 0 % (ref 0.0–0.2)

## 2020-12-03 LAB — CULTURE, BLOOD (ROUTINE X 2): Special Requests: ADEQUATE

## 2020-12-03 MED ORDER — TRAMADOL HCL 50 MG PO TABS
25.0000 mg | ORAL_TABLET | Freq: Once | ORAL | Status: AC
  Administered 2020-12-03: 25 mg via ORAL
  Filled 2020-12-03: qty 1

## 2020-12-03 NOTE — TOC Progression Note (Signed)
Transition of Care [facility]) - Progression Note    Patient Details  Name: Yaqub Arney MRN: [id number] Date of Birth: Aug 29, 1966  Transition of Care [facility]) CM/SW Contact  Mahabir, Olegario Messier, RN Phone Number: 12/03/2020, 5:03 PM  Clinical Narrative: [facility] rep Pearson Grippe following for d/c-has Berkley Harvey.      Expected Discharge Plan: Home w Home Health Services Barriers to Discharge: Continued Medical Work up  Expected Discharge Plan and Services Expected Discharge Plan: Home w Home Health Services   Discharge Planning Services: CM Consult Post Acute Care Choice: Home Health Living arrangements for the past 2 months: Single Family Home                           HH Arranged: RN,OT,PT HH Agency: [facility] Date HH Agency Contacted: 12/02/20 Time HH Agency Contacted: 1144 Representative spoke with at [facility] Agency: Pearson Grippe   Social Determinants of Health (SDOH) Interventions    Readmission Risk Interventions No flowsheet data found.

## 2020-12-03 NOTE — Progress Notes (Signed)
PROGRESS NOTE    Dylan Weaver  VPX:[id number] DOB: [birth year] DOA: 11/29/2020 PCP: Patient, No Pcp Per   Brief Narrative: Taken from prior notes. 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  PT recommended SNF placement but patient wants to go home with home health services which were ordered.  12/03/2020: Patient seen alongside patient's nurse.  Patient is not keen on being discharged back home today.  Likely discharge back home tomorrow.  Subjective: No new complaints. -Patient is not keen on being discharged back on today.  Patient is worried the mother would not be able to look after him today.  Likely discharge tomorrow.   Assessment & Plan:   Principal Problem:   SIRS (systemic inflammatory response syndrome) (HCC) Active Problems:   Alcohol use disorder, severe, dependence (HCC)   Thrombocytopenia (HCC)   Elevated LFTs   Intertrigo   Prolonged QT interval  Fungal dermatitis.  Exam consistent with intertrigo involving the groin, scrotum and buttock area with some skin damage.  Wound care was also consulted. Patient was febrile on presentation, he was given cefepime and vancomycin.  1 out of 4 blood cultures with gram-positive rods most likely a contaminant.  Urine cultures negative. Remained afebrile-was started on doxycycline. -Discontinue doxycycline. -Continue with Gerhart's and ketoconazole cream  Chronic hyponatremia.  Secondary to alcohol abuse. Sodium appears to be around his baseline.  History of alcohol dependence. -Continue with CIWA protocol -Continue with thiamine and folic acid supplement  Abnormal liver enzymes.  Most likely secondary to alcohol abuse.  Ultrasound consistent with  hepatic steatosis.  Liver enzymes improving. -Continue to monitor  Physical deconditioning. -PT is recommending SNF placement.  Hypokalemia and hypomagnesemia. -Resolved. -Continue to monitor.    Normocytic anemia.   Chronic.     Objective: Vitals:   12/02/20 1321 12/02/20 2057 12/03/20 0647 12/03/20 1430  BP: 131/87 (!) 145/93 (!) 142/87 (!) 141/87  Pulse: 63 62 (!) 53 (!) 59  Resp: 16 15 15 18   Temp:  98.4 F (36.9 C) 97.9 F (36.6 C) 98.5 F (36.9 C)  TempSrc:  Oral Oral Oral  SpO2: 98% 99% 99% 100%  Weight:      Height:        Intake/Output Summary (Last 24 hours) at 12/03/2020 1639 Last data filed at 12/03/2020 1630 Gross per 24 hour  Intake 320 ml  Output 1075 ml  Net -755 ml   Filed Weights   11/30/20 0252  Weight: 76.5 kg    Examination:  General.  Awake and alert.  Not in any distress.  Mild redness of the scrotum Pulmonary.  Clear to auscultation.   CV.  S1-S2.   Abdomen.  Soft, nontender, nondistended, BS positive. CNS.  Alert and oriented x3.  Extremities.  No edema,   DVT prophylaxis: Lovenox Code Status: Full Family Communication: Discussed with patient Disposition Plan:  Status is: Inpatient  Remains inpatient appropriate because:Inpatient level of care appropriate due to severity of illness   Dispo: The patient is from: Home              Anticipated d/c is to: Home with home health.              Patient currently is medically stable to d/c.   Difficult to place patient No  Level of care: Telemetry  All the records are reviewed and case discussed with Care Management/Social Worker. Management plans discussed with the patient, nursing and they are in agreement.  Consultants:   None  Procedures:  Antimicrobials:   Data Reviewed: I have personally reviewed following labs and imaging studies  CBC: Recent Labs  Lab 11/29/20 1845 11/30/20 0450 12/01/20 0450 12/02/20 0503 12/03/20 0436  WBC 11.2* 7.1 5.5 4.7 5.1   NEUTROABS 9.2*  --  3.0 2.4 2.7  HGB 12.9* 11.4* 10.6* 11.0* 11.3*  HCT 39.3 35.4* 32.4* 32.7* 34.4*  MCV 91.2 93.7 94.2 91.3 92.2  PLT 123* 105* 120* 147* 187   Basic Metabolic Panel: Recent Labs  Lab 11/29/20 1845 11/30/20 0450 12/01/20 0450 12/02/20 0503 12/03/20 0436  NA 131* 134* 127* 131* 130*  K 4.3 3.0* 2.9* 3.7 3.7  CL 98 104 100 102 101  CO2 19* 21* 19* 22 21*  GLUCOSE 114* 88 103* 99 99  BUN 17 10 9 7 9   CREATININE 0.81 0.63 0.61 0.61 0.70  CALCIUM 9.1 8.4* 8.1* 8.8* 8.7*  MG 1.8 2.2 1.6* 1.5* 1.8  PHOS  --  2.1*  --   --   --    GFR: Estimated Creatinine Clearance: 114.2 mL/min (by C-G formula based on SCr of 0.7 mg/dL). Liver Function Tests: Recent Labs  Lab 11/29/20 1845 11/30/20 0450 12/01/20 0450 12/02/20 0503 12/03/20 0436  AST 167* 126* 161* 106* 73*  ALT 69* 55* 69* 65* 57*  ALKPHOS 318* 268* 255* 240* 223*  BILITOT 3.4* 2.6* 1.6* 1.2 1.0  PROT 10.2* 8.2* 7.2 7.7 7.8  ALBUMIN 3.4* 2.8* 2.5* 2.8* 2.8*   No results for input(s): LIPASE, AMYLASE in the last 168 hours. Recent Labs  Lab 11/29/20 2305  AMMONIA 49*   Coagulation Profile: Recent Labs  Lab 11/29/20 1845  INR 1.0   Cardiac Enzymes: Recent Labs  Lab 11/29/20 1845  CKTOTAL 82   BNP (last 3 results) No results for input(s): PROBNP in the last 8760 hours. HbA1C: No results for input(s): HGBA1C in the last 72 hours. CBG: No results for input(s): GLUCAP in the last 168 hours. Lipid Profile: No results for input(s): CHOL, HDL, LDLCALC, TRIG, CHOLHDL, LDLDIRECT in the last 72 hours. Thyroid Function Tests: No results for input(s): TSH, T4TOTAL, FREET4, T3FREE, THYROIDAB in the last 72 hours. Anemia Panel: No results for input(s): VITAMINB12, FOLATE, FERRITIN, TIBC, IRON, RETICCTPCT in the last 72 hours. Sepsis Labs: Recent Labs  Lab 11/29/20 1845 11/29/20 2305  PROCALCITON  --  0.28  LATICACIDVEN 1.8  --     Recent Results (from the past 240 hour(s))  Resp Panel by  RT-PCR (Flu A&B, Covid) Nasopharyngeal Swab     Status: None   Collection Time: 11/29/20  6:27 PM   Specimen: Nasopharyngeal Swab; Nasopharyngeal(NP) swabs in vial transport medium  Result Value Ref Range Status   SARS Coronavirus 2 by RT PCR NEGATIVE NEGATIVE Final    Comment: (NOTE) SARS-CoV-2 target nucleic acids are NOT DETECTED.  The SARS-CoV-2 RNA is generally detectable in upper respiratory specimens during the acute phase of infection. The lowest concentration of SARS-CoV-2 viral copies this assay can detect is 138 copies/mL. A negative result does not preclude SARS-Cov-2 infection and should not be used as the sole basis for treatment or other patient management decisions. A negative result may occur with  improper specimen collection/handling, submission of specimen other than nasopharyngeal swab, presence of viral mutation(s) within the areas targeted by  this assay, and inadequate number of viral copies(<138 copies/mL). A negative result must be combined with clinical observations, patient history, and epidemiological information. The expected result is Negative.  Fact Sheet for Patients:  BloggerCourse.com  Fact Sheet for Healthcare Providers:  SeriousBroker.it  This test is no t yet approved or cleared by the [place] FDA and  has been authorized for detection and/or diagnosis of SARS-CoV-2 by FDA under an Emergency Use Authorization (EUA). This EUA will remain  in effect (meaning this test can be used) for the duration of the COVID-19 declaration under Section 564(b)(1) of the Act, 21 U.S.C.section 360bbb-3(b)(1), unless the authorization is terminated  or revoked sooner.       Influenza A by PCR NEGATIVE NEGATIVE Final   Influenza B by PCR NEGATIVE NEGATIVE Final    Comment: (NOTE) The Xpert Xpress SARS-CoV-2/FLU/RSV plus assay is intended as an aid in the diagnosis of influenza from Nasopharyngeal swab  specimens and should not be used as a sole basis for treatment. Nasal washings and aspirates are unacceptable for Xpert Xpress SARS-CoV-2/FLU/RSV testing.  Fact Sheet for Patients: BloggerCourse.com  Fact Sheet for Healthcare Providers: SeriousBroker.it  This test is not yet approved or cleared by the [place] FDA and has been authorized for detection and/or diagnosis of SARS-CoV-2 by FDA under an Emergency Use Authorization (EUA). This EUA will remain in effect (meaning this test can be used) for the duration of the COVID-19 declaration under Section 564(b)(1) of the Act, 21 U.S.C. section 360bbb-3(b)(1), unless the authorization is terminated or revoked.  Performed at [facility], [address] [place], [place] [address]   Blood culture (routine x 2)     Status: Abnormal   Collection Time: 11/29/20  6:45 PM   Specimen: BLOOD  Result Value Ref Range Status   Specimen Description   Final    BLOOD BLOOD RIGHT HAND Performed at [facility], [address] [place], [place] [address]    Special Requests   Final    BOTTLES DRAWN AEROBIC AND ANAEROBIC Blood Culture adequate volume Performed at [facility], [address] [place], [place] [address]    Culture  Setup Time   Final    GRAM POSITIVE RODS AEROBIC BOTTLE ONLY CRITICAL RESULT CALLED TO, READ BACK BY AND VERIFIED WITH: J. LEGGE PHARMD, AT 0908 12/01/20 BY D. VANHOOK    Culture (A)  Final    CORYNEBACTERIUM STRIATUM Standardized susceptibility testing for this organism is not available. Performed at [facility], [address], [place], [place] [address]    Report Status 12/03/2020 FINAL  Final  Blood culture (routine x 2)     Status: None (Preliminary result)   Collection Time: 11/29/20  6:50 PM   Specimen: BLOOD  Result Value Ref Range Status   Specimen Description   Final    BLOOD BLOOD RIGHT  FOREARM Performed at [facility], [address], [place], [place] [address]    Special Requests   Final    BOTTLES DRAWN AEROBIC AND ANAEROBIC Blood Culture adequate volume Performed at [facility], [address], [place], [place] [address]    Culture   Final    NO GROWTH 4 DAYS Performed at [facility], [address], [place], [place] [address]    Report Status PENDING  Incomplete  Urine culture     Status: None   Collection Time: 11/29/20  9:13 PM   Specimen:  Urine, Random  Result Value Ref Range Status   Specimen Description   Final    URINE, RANDOM Performed at [facility], [address] [place], [place] [address]    Special Requests   Final    NONE Performed at [facility], [address], [place], [place] [address]    Culture   Final    NO GROWTH Performed at [facility], [address], [place], [place] [address]    Report Status 12/01/2020 FINAL  Final     Radiology Studies: No results found.  Scheduled Meds: . enoxaparin (LOVENOX) injection  40 mg Subcutaneous Q24H  . folic acid  1 mg Oral Daily  . Gerhardt's butt cream   Topical BID  . ketoconazole   Topical BID  . liver oil-zinc oxide   Topical BID  . LORazepam  0-4 mg Intravenous Q12H  . magnesium oxide  400 mg Oral Daily  . multivitamin with minerals  1 tablet Oral QAC breakfast  . potassium chloride  40 mEq Oral BID  . thiamine  100 mg Oral Daily   Or  . thiamine  100 mg Intravenous Daily   Continuous Infusions: . sodium chloride Stopped (12/02/20 1400)     LOS: 3 days   Time spent: 25 minutes. More than 50% of the time was spent in counseling/coordination of care  Barnetta Chapel, MD [facility]  If 7PM-7AM, please contact night-coverage [email or web address]  12/03/2020, 4:39 PM   This record has been created using Conservation officer, historic buildings. Errors have been sought and  corrected,but may not always be located. Such creation errors do not reflect on the standard of care.

## 2020-12-03 NOTE — Progress Notes (Signed)
PT Cancellation Note  Patient Details Name: Susana Gripp MRN: [id number] DOB: 1966-08-14   Cancelled Treatment:    Reason Eval/Treat Not Completed: Patient declined, no reason specified Pt declines to participate today.   LEMYRE,KATHrine E 12/03/2020, 2:03 PM Paulino Door, DPT Acute Rehabilitation Services Pager: [phone] Office: [phone]

## 2020-12-04 LAB — CULTURE, BLOOD (ROUTINE X 2)
Culture: NO GROWTH
Special Requests: ADEQUATE

## 2020-12-04 MED ORDER — ZINC OXIDE 40 % EX OINT: TOPICAL_OINTMENT | Freq: Two times a day (BID) | CUTANEOUS | 0 refills | Status: DC

## 2020-12-04 MED ORDER — KETOCONAZOLE 2 % EX CREA: TOPICAL_CREAM | Freq: Two times a day (BID) | CUTANEOUS | 0 refills | Status: DC

## 2020-12-04 MED ORDER — FOLIC ACID 1 MG PO TABS: 1.0000 mg | ORAL_TABLET | Freq: Every day | ORAL | 1 refills | Status: DC

## 2020-12-04 MED ORDER — ADULT MULTIVITAMIN W/MINERALS CH: 1.0000 | ORAL_TABLET | Freq: Every day | ORAL | 1 refills | Status: DC

## 2020-12-04 MED ORDER — THIAMINE HCL 100 MG PO TABS: 100.0000 mg | ORAL_TABLET | Freq: Every day | ORAL | 1 refills | Status: DC

## 2020-12-04 MED ORDER — ENSURE ENLIVE PO LIQD: 237.0000 mL | Freq: Two times a day (BID) | ORAL | 1 refills | Status: AC

## 2020-12-04 MED ORDER — GERHARDT'S BUTT CREAM: 1.0000 "application " | TOPICAL_CREAM | Freq: Two times a day (BID) | CUTANEOUS | 1 refills | Status: DC

## 2020-12-04 NOTE — Discharge Summary (Signed)
Physician Discharge Summary  Patient ID: Dylan Weaver MRN: [id number] DOB/AGE: Sep 04, 1966 55 y.o.  Admit date: 11/29/2020 Discharge date: 12/04/2020  Admission Diagnoses:  Discharge Diagnoses:  Principal Problem:   SIRS (systemic inflammatory response syndrome) (HCC) Active Problems:   Alcohol use disorder, severe, dependence (HCC)   Thrombocytopenia (HCC)   Elevated LFTs   Intertrigo   Prolonged QT interval Fungal dermatitis.  Discharged Condition: fair  Hospital Course: Patient is a 55 year old Caucasian male with past medical history significant for alcohol dependence, chronic hyponatremia, and chronically elevated liver function test.  Patient was admitted with a rash involving the buttock and groin area.  Patient continues to abuse alcohol.  Patient blames his current family situation as the reason for current alcohol use.  According to the patient, he is going through divorce.  Patient was admitted and managed for alcohol dependence syndrome and abuse, as well as, fungal dermatitis of the buttocks and perineal area.  The dermatitis has improved significantly.  Patient was seen by the physical therapy team and skilled nursing facility placement was advised.  Patient has refused placement at the skilled nursing facility.  Patient has capacity to make medical decisions.  Patient lives with his mother.  Patient has chosen to be discharged back home with home health.  Patient has been optimized, and will be discharged back on today to the care of the primary care provider.  Fungal dermatitis: - Continue with Gerhart's and ketoconazole cream  Chronic hyponatremia:   -Currently at baseline. -Sodium prior to discharge was 130.    Alcohol dependence syndrome/abuse:  -CIWA protocol was utilized. -Continue with thiamine and folic acid supplement  Abnormal liver enzymes: -Most likely secondary to alcohol abuse.   -Ultrasound consistent with hepatic steatosis.  -Continue to  monitor  Physical deconditioning. -Skilled nursing facility placement was recommended, but patient refused.    Hypokalemia and hypomagnesemia. -Resolved. -Continue to monitor.    Normocytic anemia.   Chronic.    Consults: Wound care team  Discharge Exam: Blood pressure 112/86, pulse 72, temperature 98.9 F (37.2 C), temperature source Oral, resp. rate 15, height 6' (1.829 m), weight 76.5 kg, SpO2 95 %.   Disposition: Discharge disposition: 06-Home-Health Care Svc  Discharge Instructions    Diet - low sodium heart healthy   Complete by: As directed    Discharge wound care:   Complete by: As directed    Continue current wound care.   Increase activity slowly   Complete by: As directed      Allergies as of 12/04/2020   No Known Allergies     Medication List    STOP taking these medications   sodium chloride 1 g tablet     TAKE these medications   feeding supplement Liqd Take 237 mLs by mouth 2 (two) times daily between meals. What changed: when to take this   folic acid 1 MG tablet Commonly known as: FOLVITE Take 1 tablet (1 mg total) by mouth daily.   Gerhardt's butt cream Crea Apply 1 application topically 2 (two) times daily.   ketoconazole 2 % cream Commonly known as: NIZORAL Apply topically 2 (two) times daily.   liver oil-zinc oxide 40 % ointment Commonly known as: DESITIN Apply topically 2 (two) times daily.   multivitamin with minerals Tabs tablet Take 1 tablet by mouth daily before breakfast. What changed: when to take this   thiamine 100 MG tablet Take 1 tablet (100 mg total) by mouth daily.  Discharge Care Instructions  (From admission, onward)         Start     Ordered   12/04/20 0000  Discharge wound care:       Comments: Continue current wound care.   12/04/20 1049          Follow-up Information    [facility] Follow up.   Why: HH nursing/physical therapy/occupational therapy Contact  information: [address] [place] [id number] [phone]              Signed: Barnetta Chapel 12/04/2020, 10:50 AM

## 2020-12-28 ENCOUNTER — Emergency Department (HOSPITAL_COMMUNITY)
Admission: EM | Admit: 2020-12-28 | Discharge: 2020-12-29 | Disposition: A | Discharge status: Home / Self Care | Payer: No Typology Code available for payment source | Attending: Emergency Medicine | Admitting: Emergency Medicine

## 2020-12-28 DIAGNOSIS — F17200 Nicotine dependence, unspecified, uncomplicated: Secondary | ICD-10-CM | POA: Diagnosis not present

## 2020-12-28 DIAGNOSIS — F10129 Alcohol abuse with intoxication, unspecified: Secondary | ICD-10-CM | POA: Insufficient documentation

## 2020-12-28 DIAGNOSIS — S80911A Unspecified superficial injury of right knee, initial encounter: Secondary | ICD-10-CM | POA: Diagnosis present

## 2020-12-28 DIAGNOSIS — F10920 Alcohol use, unspecified with intoxication, uncomplicated: Secondary | ICD-10-CM

## 2020-12-28 DIAGNOSIS — S80211A Abrasion, right knee, initial encounter: Secondary | ICD-10-CM | POA: Insufficient documentation

## 2020-12-28 DIAGNOSIS — Y92410 Unspecified street and highway as the place of occurrence of the external cause: Secondary | ICD-10-CM | POA: Insufficient documentation
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
## 2020-12-29 NOTE — Discharge Instructions (Addendum)
You were evaluated in the Emergency Department and after careful evaluation, we did not find any emergent condition requiring admission or further testing in the hospital.  Your exam/testing today was overall reassuring.  Please return to the Emergency Department if you experience any worsening of your condition.  Thank you for allowing us to be a part of your care.  

## 2021-01-24 ENCOUNTER — Emergency Department (HOSPITAL_COMMUNITY)
Admission: EM | Admit: 2021-01-24 | Discharge: 2021-01-24 | Disposition: A | Discharge status: Home / Self Care | Payer: No Typology Code available for payment source | Attending: Emergency Medicine | Admitting: Emergency Medicine

## 2021-01-24 ENCOUNTER — Encounter (HOSPITAL_COMMUNITY): Payer: Self-pay | Admitting: *Deleted

## 2021-01-24 ENCOUNTER — Emergency Department (HOSPITAL_COMMUNITY): Payer: No Typology Code available for payment source

## 2021-01-24 DIAGNOSIS — Y908 Blood alcohol level of 240 mg/100 ml or more: Secondary | ICD-10-CM | POA: Insufficient documentation

## 2021-01-24 DIAGNOSIS — R109 Unspecified abdominal pain: Secondary | ICD-10-CM | POA: Diagnosis not present

## 2021-01-24 DIAGNOSIS — F17200 Nicotine dependence, unspecified, uncomplicated: Secondary | ICD-10-CM | POA: Insufficient documentation

## 2021-01-24 DIAGNOSIS — F10129 Alcohol abuse with intoxication, unspecified: Secondary | ICD-10-CM | POA: Insufficient documentation

## 2021-01-24 DIAGNOSIS — R0781 Pleurodynia: Secondary | ICD-10-CM | POA: Diagnosis not present

## 2021-01-24 DIAGNOSIS — F10920 Alcohol use, unspecified with intoxication, uncomplicated: Secondary | ICD-10-CM

## 2021-01-24 LAB — URINALYSIS, ROUTINE W REFLEX MICROSCOPIC
Bilirubin Urine: NEGATIVE
Glucose, UA: NEGATIVE mg/dL
Hgb urine dipstick: NEGATIVE
Ketones, ur: NEGATIVE mg/dL
Leukocytes,Ua: NEGATIVE
Nitrite: NEGATIVE
Protein, ur: NEGATIVE mg/dL
Specific Gravity, Urine: 1.009 (ref 1.005–1.030)
pH: 6 (ref 5.0–8.0)

## 2021-01-24 LAB — CBC WITH DIFFERENTIAL/PLATELET
Abs Immature Granulocytes: 0.03 10*3/uL (ref 0.00–0.07)
Basophils Absolute: 0.2 10*3/uL — ABNORMAL HIGH (ref 0.0–0.1)
Basophils Relative: 2 %
Eosinophils Absolute: 0.2 10*3/uL (ref 0.0–0.5)
Eosinophils Relative: 3 %
HCT: 37.2 % — ABNORMAL LOW (ref 39.0–52.0)
Hemoglobin: 12.4 g/dL — ABNORMAL LOW (ref 13.0–17.0)
Immature Granulocytes: 0 %
Lymphocytes Relative: 28 %
Lymphs Abs: 2 10*3/uL (ref 0.7–4.0)
MCH: 31.8 pg (ref 26.0–34.0)
MCHC: 33.3 g/dL (ref 30.0–36.0)
MCV: 95.4 fL (ref 80.0–100.0)
Monocytes Absolute: 0.7 10*3/uL (ref 0.1–1.0)
Monocytes Relative: 10 %
Neutro Abs: 4 10*3/uL (ref 1.7–7.7)
Neutrophils Relative %: 57 %
Platelets: 297 10*3/uL (ref 150–400)
RBC: 3.9 MIL/uL — ABNORMAL LOW (ref 4.22–5.81)
RDW: 17.3 % — ABNORMAL HIGH (ref 11.5–15.5)
WBC: 6.9 10*3/uL (ref 4.0–10.5)
nRBC: 0 % (ref 0.0–0.2)

## 2021-01-24 LAB — RAPID URINE DRUG SCREEN, HOSP PERFORMED
Amphetamines: NOT DETECTED
Barbiturates: NOT DETECTED
Benzodiazepines: NOT DETECTED
Cocaine: NOT DETECTED
Opiates: NOT DETECTED
Tetrahydrocannabinol: NOT DETECTED

## 2021-01-24 LAB — COMPREHENSIVE METABOLIC PANEL
ALT: 55 U/L — ABNORMAL HIGH (ref 0–44)
AST: 130 U/L — ABNORMAL HIGH (ref 15–41)
Albumin: 3.2 g/dL — ABNORMAL LOW (ref 3.5–5.0)
Alkaline Phosphatase: 285 U/L — ABNORMAL HIGH (ref 38–126)
Anion gap: 13 (ref 5–15)
BUN: <5 mg/dL — ABNORMAL LOW (ref 6–20)
CO2: 22 mmol/L (ref 22–32)
Calcium: 8.5 mg/dL — ABNORMAL LOW (ref 8.9–10.3)
Chloride: 101 mmol/L (ref 98–111)
Creatinine, Ser: 0.74 mg/dL (ref 0.61–1.24)
GFR, Estimated: >60 mL/min (ref >60)
Glucose, Bld: 111 mg/dL — ABNORMAL HIGH (ref 70–99)
Potassium: 3.8 mmol/L (ref 3.5–5.1)
Sodium: 136 mmol/L (ref 135–145)
Total Bilirubin: 0.8 mg/dL (ref 0.3–1.2)
Total Protein: 8.4 g/dL — ABNORMAL HIGH (ref 6.5–8.1)

## 2021-01-24 LAB — LIPASE, BLOOD: Lipase: 40 U/L (ref 11–51)

## 2021-01-24 LAB — ETHANOL: Alcohol, Ethyl (B): 317 mg/dL (ref <10)

## 2021-01-24 LAB — SALICYLATE LEVEL: Salicylate Lvl: <7 mg/dL — ABNORMAL LOW (ref 7.0–30.0)

## 2021-01-24 MED ORDER — LORAZEPAM 2 MG/ML IJ SOLN: 0.0000 mg | Freq: Four times a day (QID) | INTRAMUSCULAR | Status: DC

## 2021-01-24 MED ORDER — LORAZEPAM 1 MG PO TABS: 0.0000 mg | ORAL_TABLET | Freq: Four times a day (QID) | ORAL | Status: DC

## 2021-01-24 MED ORDER — LORAZEPAM 2 MG/ML IJ SOLN: 0.0000 mg | Freq: Two times a day (BID) | INTRAMUSCULAR | Status: DC

## 2021-01-24 MED ORDER — LORAZEPAM 1 MG PO TABS: 0.0000 mg | ORAL_TABLET | Freq: Two times a day (BID) | ORAL | Status: DC

## 2021-01-24 MED ORDER — THIAMINE HCL 100 MG/ML IJ SOLN: 100.0000 mg | Freq: Every day | INTRAMUSCULAR | Status: DC

## 2021-01-24 MED ORDER — THIAMINE HCL 100 MG PO TABS
100.0000 mg | ORAL_TABLET | Freq: Every day | ORAL | Status: DC
  Administered 2021-01-24: 100 mg via ORAL
  Filled 2021-01-24: qty 1

## 2021-01-24 NOTE — ED Provider Notes (Signed)
[facility]-EMERGENCY DEPT Provider Note   CSN: [id number] Arrival date & time: 01/24/21  1453     History Chief Complaint  Patient presents with  . Flank Pain    Dylan Weaver is a 55 y.o. male.  55 y.o male with a PMH of prolonged QT, ETOH abuse, SIRS presents to the ED via EMS with a chief complaint of left side pain. Patient reports he has been drinking for the past 7 days as his mother recently passed away, his wife filed for divorce.  1 question again patient reports his last drink was about 7 days ago.  He reports he is having pain along the left rib, unknown injury.  Pain is exacerbated with palpation along with movement.  Denies any chest pain, shortness of breath, urinary symptoms. He denies any SI, HI, visual or auditory hallucinations.  The history is provided by the patient.  Flank Pain This is a new problem. The current episode started more than 1 week ago. The problem occurs constantly. The problem has not changed since onset.Pertinent negatives include no chest pain, no abdominal pain, no headaches and no shortness of breath. The symptoms are aggravated by exertion.       Past Medical History:  Diagnosis Date  . ETOH abuse     Patient Active Problem List   Diagnosis Date Noted  . SIRS (systemic inflammatory response syndrome) (HCC) 11/29/2020  . Elevated LFTs 11/29/2020  . Intertrigo 11/29/2020  . Prolonged QT interval 11/29/2020  . Malnutrition of moderate degree 09/16/2020  . Alcohol withdrawal (HCC) 09/14/2020  . Alcoholic hepatitis 09/14/2020  . Thrombocytopenia (HCC) 09/14/2020  . Normochromic normocytic anemia 09/14/2020  . Alcohol use disorder, severe, dependence (HCC) 06/19/2020  . Alcohol abuse with intoxication (HCC) 06/19/2020    Past Surgical History:  Procedure Laterality Date  . NASAL SEPTUM SURGERY         Family History  Family history unknown: Yes    Social History   Tobacco Use  . Smoking  status: Current Every Day Smoker  . Smokeless tobacco: Never Used  Substance Use Topics  . Alcohol use: Yes  . Drug use: Never    Home Medications Prior to Admission medications   Medication Sig Start Date End Date Taking? Authorizing Provider  folic acid (FOLVITE) 1 MG tablet Take 1 tablet (1 mg total) by mouth daily. 12/04/20   Barnetta Chapel, MD  ketoconazole (NIZORAL) 2 % cream Apply topically 2 (two) times daily. 12/04/20   Barnetta Chapel, MD  liver oil-zinc oxide (DESITIN) 40 % ointment Apply topically 2 (two) times daily. 12/04/20   Barnetta Chapel, MD  Multiple Vitamin (MULTIVITAMIN WITH MINERALS) TABS tablet Take 1 tablet by mouth daily before breakfast. 12/04/20   Barnetta Chapel, MD  Nystatin (GERHARDT'S BUTT CREAM) CREA Apply 1 application topically 2 (two) times daily. 12/04/20   Berton Mount I, MD  thiamine 100 MG tablet Take 1 tablet (100 mg total) by mouth daily. 12/04/20   Barnetta Chapel, MD    Allergies    Patient has no known allergies.  Review of Systems   Review of Systems  Constitutional: Negative for fever.  HENT: Negative for sore throat.   Respiratory: Negative for shortness of breath.   Cardiovascular: Negative for chest pain.  Gastrointestinal: Negative for abdominal pain, nausea and vomiting.  Genitourinary: Positive for flank pain.  Musculoskeletal: Negative for back pain.  Neurological: Negative for light-headedness and headaches.  All other systems reviewed  and are negative.   Physical Exam Updated Vital Signs BP 104/76 (BP Location: Right Arm)   Pulse (!) 103   Temp 98.4 F (36.9 C) (Oral)   Resp 18   SpO2 93%   Physical Exam Vitals and nursing note reviewed.  Constitutional:      Appearance: Normal appearance.     Comments: Intoxicated, smells of alcohol.  HENT:     Head: Normocephalic and atraumatic.     Mouth/Throat:     Mouth: Mucous membranes are moist.  Eyes:     Pupils: Pupils are equal, round, and reactive to  light.  Cardiovascular:     Rate and Rhythm: Normal rate.  Pulmonary:     Effort: Pulmonary effort is normal.  Chest:     Chest wall: Tenderness present.       Comments: Tenderness with palpation of the left lower chest.  Abdominal:     General: Abdomen is flat.     Tenderness: There is no abdominal tenderness. There is left CVA tenderness.  Musculoskeletal:        General: Tenderness present.     Cervical back: Normal range of motion and neck supple.  Skin:    General: Skin is warm and dry.  Neurological:     Mental Status: He is alert. He is disoriented.     ED Results / Procedures / Treatments   Labs (all labs ordered are listed, but only abnormal results are displayed) Labs Reviewed  CBC WITH DIFFERENTIAL/PLATELET - Abnormal; Notable for the following components:      Result Value   RBC 3.90 (*)    Hemoglobin 12.4 (*)    HCT 37.2 (*)    RDW 17.3 (*)    Basophils Absolute 0.2 (*)    All other components within normal limits  COMPREHENSIVE METABOLIC PANEL - Abnormal; Notable for the following components:   Glucose, Bld 111 (*)    BUN <5 (*)    Calcium 8.5 (*)    Total Protein 8.4 (*)    Albumin 3.2 (*)    AST 130 (*)    ALT 55 (*)    Alkaline Phosphatase 285 (*)    All other components within normal limits  ETHANOL - Abnormal; Notable for the following components:   Alcohol, Ethyl (B) 317 (*)    All other components within normal limits  SALICYLATE LEVEL - Abnormal; Notable for the following components:   Salicylate Lvl <7.0 (*)    All other components within normal limits  LIPASE, BLOOD  URINALYSIS, ROUTINE W REFLEX MICROSCOPIC  RAPID URINE DRUG SCREEN, HOSP PERFORMED    EKG None  Radiology DG Chest 2 View  Result Date: 01/24/2021 CLINICAL DATA:  Wheezing.  Left lower rib pain EXAM: CHEST - 2 VIEW COMPARISON:  11/29/2020 FINDINGS: Mild bibasilar atelectasis. Negative for pneumonia or heart failure. No pleural effusion. Fracture left lateral sixth rib  appears acute. No pneumothorax. IMPRESSION: Fracture left lateral sixth rib Bibasilar atelectasis. Electronically Signed   By: Marlan Palau M.D.   On: 01/24/2021 16:44    Procedures Procedures   Medications Ordered in ED Medications  thiamine tablet 100 mg (100 mg Oral Given 01/24/21 1839)    Or  thiamine (B-1) injection 100 mg ( Intravenous See Alternative 01/24/21 1839)    ED Course  I have reviewed the triage vital signs and the nursing notes.  Pertinent labs & imaging results that were available during my care of the patient were reviewed by me and considered  in my medical decision making (see chart for details).  Clinical Course as of 01/24/21 1905  Mon Jan 24, 2021  1747 Hemoglobin(!): 12.4 [JS]  1755 Alcohol, Ethyl (B)(!!): 317 [JS]    Clinical Course User Index [JS] Claude Manges, PA-C   MDM Rules/Calculators/A&P   Patient presents to the ED via EMS with a chief complaint of left lower side pain which has been ongoing.  Reports he has been drinking for the past 7 to 10 days, however patient currently smells of alcohol.  He reports he has been upset due to his wife filing for divorce along with    Interpretation of his labs reveal a CBC without any leukocytosis, hemoglobin is slightly decreased however does have a history of chronic anemia.  CMP without any electrolyte derangement, creatinine level is within normal limits.  LFTs remarkable for AST elevated at 130, ALT is 55.  This is actually around patient's baseline.  Ethanol level on today's visit is 317. Salicylate level normal UA without any nitrates, leukocytes.  Lipase level is within normal limits Xray of his chest showed: Fracture left lateral sixth rib    Bibasilar atelectasis.     6:34 PM Spoke to [place] cousin, who has not spoken with him for about a week, he has had problems with alcohol for several years, she reports he does drink daily.  His mother service was this afternoon, he failed to show up, reports  that he was found intoxicated and had soiled his underwear.  UA without any signs of infection, UDS is negative for any substance.  Patient was informed of the results of his x-ray with his left rib fracture, he was encouraged to try over-the-counter medication such as ibuprofen to help with symptomatic control.  He currently called a [employer] to pick him up, however I have contacted his cousin who will come pick him up.  Patient has been asleep in the stretcher in no distress.   Portions of this note were generated with Scientist, clinical (histocompatibility and immunogenetics). Dictation errors may occur despite best attempts at proofreading.  Final Clinical Impression(s) / ED Diagnoses Final diagnoses:  Alcoholic intoxication without complication [facility])    Rx / DC Orders ED Discharge Orders    None       Freddy Jaksch 01/24/21 1905    Lorre Nick, MD 01/25/21 1929

## 2021-01-24 NOTE — ED Triage Notes (Signed)
Per EMS, pt complains of left sided flank pain. Reports 3 day bender of alcohol after mother's death and divorce with wife. 250cc NS given en route. He received 5mg  albuterol for wheezing by another medic prior to EMS arrival.   EMS vitals   126/86 HR 112 RR 18 SpO2 98 CBG 130

## 2021-01-24 NOTE — Discharge Instructions (Addendum)
The x-ray of your chest showed a fracture of your left lateral sixth rib, you may take ibuprofen to help with your pain. If  you experience shortness of breath, chest pain, please return to the ED.    Your alcohol level was very high on today's visit, I called your cousin in order to pick you up.  Resources to detox facilities are attached to your discharge papers.

## 2021-02-07 ENCOUNTER — Other Ambulatory Visit: Payer: Self-pay

## 2021-02-07 ENCOUNTER — Emergency Department (HOSPITAL_COMMUNITY)
Admission: EM | Admit: 2021-02-07 | Discharge: 2021-02-08 | Disposition: A | Discharge status: Home / Self Care | Payer: No Typology Code available for payment source | Attending: Emergency Medicine | Admitting: Emergency Medicine

## 2021-02-07 ENCOUNTER — Encounter (HOSPITAL_COMMUNITY): Payer: Self-pay | Admitting: Emergency Medicine

## 2021-02-07 DIAGNOSIS — Y900 Blood alcohol level of less than 20 mg/100 ml: Secondary | ICD-10-CM | POA: Diagnosis not present

## 2021-02-07 DIAGNOSIS — E876 Hypokalemia: Secondary | ICD-10-CM | POA: Diagnosis not present

## 2021-02-07 DIAGNOSIS — E871 Hypo-osmolality and hyponatremia: Secondary | ICD-10-CM | POA: Insufficient documentation

## 2021-02-07 DIAGNOSIS — F4320 Adjustment disorder, unspecified: Secondary | ICD-10-CM | POA: Diagnosis present

## 2021-02-07 DIAGNOSIS — F17200 Nicotine dependence, unspecified, uncomplicated: Secondary | ICD-10-CM | POA: Diagnosis not present

## 2021-02-07 DIAGNOSIS — F32A Depression, unspecified: Secondary | ICD-10-CM

## 2021-02-07 DIAGNOSIS — F1010 Alcohol abuse, uncomplicated: Secondary | ICD-10-CM | POA: Diagnosis not present

## 2021-02-07 DIAGNOSIS — E872 Acidosis: Secondary | ICD-10-CM | POA: Insufficient documentation

## 2021-02-07 DIAGNOSIS — E8729 Other acidosis: Secondary | ICD-10-CM

## 2021-02-07 DIAGNOSIS — Z20822 Contact with and (suspected) exposure to covid-19: Secondary | ICD-10-CM | POA: Insufficient documentation

## 2021-02-07 LAB — COMPREHENSIVE METABOLIC PANEL
ALT: 59 U/L — ABNORMAL HIGH (ref 0–44)
AST: 154 U/L — ABNORMAL HIGH (ref 15–41)
Albumin: 3.2 g/dL — ABNORMAL LOW (ref 3.5–5.0)
Alkaline Phosphatase: 367 U/L — ABNORMAL HIGH (ref 38–126)
Anion gap: 18 — ABNORMAL HIGH (ref 5–15)
BUN: <5 mg/dL — ABNORMAL LOW (ref 6–20)
CO2: 22 mmol/L (ref 22–32)
Calcium: 9 mg/dL (ref 8.9–10.3)
Chloride: 88 mmol/L — ABNORMAL LOW (ref 98–111)
Creatinine, Ser: 0.8 mg/dL (ref 0.61–1.24)
GFR, Estimated: >60 mL/min (ref >60)
Glucose, Bld: 98 mg/dL (ref 70–99)
Potassium: 2.8 mmol/L — ABNORMAL LOW (ref 3.5–5.1)
Sodium: 128 mmol/L — ABNORMAL LOW (ref 135–145)
Total Bilirubin: 2.5 mg/dL — ABNORMAL HIGH (ref 0.3–1.2)
Total Protein: 8.8 g/dL — ABNORMAL HIGH (ref 6.5–8.1)

## 2021-02-07 LAB — CBC
HCT: 39.7 % (ref 39.0–52.0)
Hemoglobin: 13.7 g/dL (ref 13.0–17.0)
MCH: 31.7 pg (ref 26.0–34.0)
MCHC: 34.5 g/dL (ref 30.0–36.0)
MCV: 91.9 fL (ref 80.0–100.0)
Platelets: 96 10*3/uL — ABNORMAL LOW (ref 150–400)
RBC: 4.32 MIL/uL (ref 4.22–5.81)
RDW: 16 % — ABNORMAL HIGH (ref 11.5–15.5)
WBC: 6.1 10*3/uL (ref 4.0–10.5)
nRBC: 0 % (ref 0.0–0.2)

## 2021-02-07 LAB — RESP PANEL BY RT-PCR (FLU A&B, COVID) ARPGX2
Influenza A by PCR: NEGATIVE
Influenza B by PCR: NEGATIVE
SARS Coronavirus 2 by RT PCR: NEGATIVE

## 2021-02-07 LAB — RAPID URINE DRUG SCREEN, HOSP PERFORMED
Amphetamines: NOT DETECTED
Barbiturates: NOT DETECTED
Benzodiazepines: NOT DETECTED
Cocaine: NOT DETECTED
Opiates: NOT DETECTED
Tetrahydrocannabinol: NOT DETECTED

## 2021-02-07 LAB — ETHANOL: Alcohol, Ethyl (B): <10 mg/dL (ref <10)

## 2021-02-07 LAB — BASIC METABOLIC PANEL
Anion gap: 11 (ref 5–15)
BUN: <5 mg/dL — ABNORMAL LOW (ref 6–20)
CO2: 28 mmol/L (ref 22–32)
Calcium: 8.5 mg/dL — ABNORMAL LOW (ref 8.9–10.3)
Chloride: 91 mmol/L — ABNORMAL LOW (ref 98–111)
Creatinine, Ser: 0.66 mg/dL (ref 0.61–1.24)
GFR, Estimated: >60 mL/min (ref >60)
Glucose, Bld: 104 mg/dL — ABNORMAL HIGH (ref 70–99)
Potassium: 2.7 mmol/L — CL (ref 3.5–5.1)
Sodium: 130 mmol/L — ABNORMAL LOW (ref 135–145)

## 2021-02-07 LAB — LIPASE, BLOOD: Lipase: 26 U/L (ref 11–51)

## 2021-02-07 LAB — ACETAMINOPHEN LEVEL: Acetaminophen (Tylenol), Serum: <10 ug/mL — ABNORMAL LOW (ref 10–30)

## 2021-02-07 LAB — SALICYLATE LEVEL: Salicylate Lvl: <7 mg/dL — ABNORMAL LOW (ref 7.0–30.0)

## 2021-02-07 LAB — MAGNESIUM: Magnesium: 1.4 mg/dL — ABNORMAL LOW (ref 1.7–2.4)

## 2021-02-07 MED ORDER — POTASSIUM CHLORIDE 10 MEQ/100ML IV SOLN
10.0000 meq | Freq: Once | INTRAVENOUS | Status: AC
  Administered 2021-02-07: 10 meq via INTRAVENOUS
  Filled 2021-02-07: qty 100

## 2021-02-07 MED ORDER — ACETAMINOPHEN 325 MG PO TABS
650.0000 mg | ORAL_TABLET | Freq: Once | ORAL | Status: AC
  Administered 2021-02-07: 650 mg via ORAL
  Filled 2021-02-07: qty 2

## 2021-02-07 MED ORDER — LORAZEPAM 1 MG PO TABS: 0.0000 mg | ORAL_TABLET | Freq: Four times a day (QID) | ORAL | Status: DC

## 2021-02-07 MED ORDER — POTASSIUM CHLORIDE CRYS ER 20 MEQ PO TBCR
40.0000 meq | EXTENDED_RELEASE_TABLET | Freq: Once | ORAL | Status: AC
  Administered 2021-02-07: 40 meq via ORAL
  Filled 2021-02-07: qty 2

## 2021-02-07 MED ORDER — POTASSIUM CHLORIDE 10 MEQ/100ML IV SOLN
10.0000 meq | INTRAVENOUS | Status: AC
  Administered 2021-02-07 (×3): 10 meq via INTRAVENOUS
  Filled 2021-02-07: qty 100

## 2021-02-07 MED ORDER — LORAZEPAM 2 MG/ML IJ SOLN
0.0000 mg | Freq: Two times a day (BID) | INTRAMUSCULAR | Status: DC
Start: 2021-02-10 — End: 2021-02-08

## 2021-02-07 MED ORDER — DEXTROSE 5 % AND 0.9 % NACL IV BOLUS
1000.0000 mL | Freq: Once | INTRAVENOUS | Status: AC
  Administered 2021-02-07: 1000 mL via INTRAVENOUS

## 2021-02-07 MED ORDER — POTASSIUM CHLORIDE CRYS ER 20 MEQ PO TBCR
20.0000 meq | EXTENDED_RELEASE_TABLET | Freq: Every day | ORAL | Status: DC
  Administered 2021-02-08: 20 meq via ORAL
  Filled 2021-02-07: qty 1

## 2021-02-07 MED ORDER — GERHARDT'S BUTT CREAM
1.0000 "application " | TOPICAL_CREAM | Freq: Two times a day (BID) | CUTANEOUS | Status: DC
  Administered 2021-02-07 – 2021-02-08 (×3): 1 via TOPICAL
  Filled 2021-02-07 (×2): qty 1

## 2021-02-07 MED ORDER — ZINC OXIDE 40 % EX OINT
TOPICAL_OINTMENT | Freq: Two times a day (BID) | CUTANEOUS | Status: DC
  Filled 2021-02-07: qty 57

## 2021-02-07 MED ORDER — THIAMINE HCL 100 MG/ML IJ SOLN
100.0000 mg | Freq: Once | INTRAMUSCULAR | Status: AC
  Administered 2021-02-07: 100 mg via INTRAVENOUS
  Filled 2021-02-07: qty 2

## 2021-02-07 MED ORDER — MAGNESIUM SULFATE 2 GM/50ML IV SOLN
2.0000 g | Freq: Once | INTRAVENOUS | Status: AC
  Administered 2021-02-07: 2 g via INTRAVENOUS
  Filled 2021-02-07: qty 50

## 2021-02-07 MED ORDER — ONDANSETRON HCL 4 MG/2ML IJ SOLN
4.0000 mg | Freq: Once | INTRAMUSCULAR | Status: AC
  Administered 2021-02-07: 4 mg via INTRAVENOUS
  Filled 2021-02-07: qty 2

## 2021-02-07 MED ORDER — NYSTATIN 100000 UNIT/GM EX CREA
TOPICAL_CREAM | Freq: Once | CUTANEOUS | Status: AC
  Filled 2021-02-07: qty 15

## 2021-02-07 MED ORDER — THIAMINE HCL 100 MG PO TABS
100.0000 mg | ORAL_TABLET | Freq: Every day | ORAL | Status: DC
  Administered 2021-02-08: 100 mg via ORAL
  Filled 2021-02-07: qty 1

## 2021-02-07 MED ORDER — LORAZEPAM 1 MG PO TABS: 0.0000 mg | ORAL_TABLET | Freq: Two times a day (BID) | ORAL | Status: DC

## 2021-02-07 MED ORDER — LORAZEPAM 2 MG/ML IJ SOLN: 0.0000 mg | Freq: Four times a day (QID) | INTRAMUSCULAR | Status: DC

## 2021-02-07 MED ORDER — FOLIC ACID 1 MG PO TABS
1.0000 mg | ORAL_TABLET | Freq: Every day | ORAL | Status: DC
  Administered 2021-02-07 – 2021-02-08 (×2): 1 mg via ORAL
  Filled 2021-02-07 (×2): qty 1

## 2021-02-07 NOTE — ED Provider Notes (Signed)
[facility] DEPT Provider Note   CSN: [id number] Arrival date & time: 02/07/21  1194     History Chief Complaint  Patient presents with  . Alcohol Intoxication  . Anxiety    Binge drinking    Dylan Weaver is a 55 y.o. male.  Dylan Weaver is a 55 y.o. male with alcohol abuse, who presents via EMS from home.  Reports he is extremely depressed, short and that he is getting a divorce after his wife left him and his mother died.  He reports he has not been taking care of himself.  He arrives with fecal matter caked on him.  He has not been taking his medications.  Reports drinking heavily.  Last drink a bottle of wine last night.  Significant time, SI no plan of suicide but reports he is just not taking care of himself and does not have treatment to do this anymore.  Patient was cleaned by nursing staff and has significant redness and skin breakdown over his genitals and buttocks.  He had an admission for sepsis due to skin infection in this region a few months ago.  Reports this time it is not as bad but he knows heading that direction if he did not get it taken care of.  He has not been taking any of his medications and has not been eating well, just primarily drinking alcohol.  No other aggravating or alleviating factors.        Past Medical History:  Diagnosis Date  . ETOH abuse     Patient Active Problem List   Diagnosis Date Noted  . SIRS (systemic inflammatory response syndrome) ([place]) 11/29/2020  . Elevated LFTs 11/29/2020  . Intertrigo 11/29/2020  . Prolonged QT interval 11/29/2020  . Malnutrition of moderate degree 09/16/2020  . Alcohol withdrawal ([name]) 09/14/2020  . Alcoholic hepatitis 17/40/8144  . Thrombocytopenia ([place]) 09/14/2020  . Normochromic normocytic anemia 09/14/2020  . Alcohol use disorder, severe, dependence ([place]) 06/19/2020  . Alcohol abuse with intoxication ([place]) 06/19/2020    Past Surgical History:   Procedure Laterality Date  . NASAL SEPTUM SURGERY         Family History  Family history unknown: Yes    Social History   Tobacco Use  . Smoking status: Current Every Day Smoker  . Smokeless tobacco: Never Used  Vaping Use  . Vaping Use: Never used  Substance Use Topics  . Alcohol use: Yes  . Drug use: Never    Home Medications Prior to Admission medications   Medication Sig Start Date End Date Taking? Authorizing Provider  folic acid (FOLVITE) 1 MG tablet Take 1 tablet (1 mg total) by mouth daily. 12/04/20  Yes Dana Allan I, MD  thiamine 100 MG tablet Take 1 tablet (100 mg total) by mouth daily. 12/04/20  Yes Dana Allan I, MD  ketoconazole (NIZORAL) 2 % cream Apply topically 2 (two) times daily. Patient not taking: No sig reported 12/04/20   Bonnell Public, MD  liver oil-zinc oxide (DESITIN) 40 % ointment Apply topically 2 (two) times daily. Patient not taking: No sig reported 12/04/20   Bonnell Public, MD  Multiple Vitamin (MULTIVITAMIN WITH MINERALS) TABS tablet Take 1 tablet by mouth daily before breakfast. Patient not taking: No sig reported 12/04/20   Dana Allan I, MD  Nystatin (GERHARDT'S BUTT CREAM) CREA Apply 1 application topically 2 (two) times daily. Patient not taking: No sig reported 12/04/20   Bonnell Public, MD  Allergies    Patient has no known allergies.  Review of Systems   Review of Systems  Constitutional: Negative for chills and fever.  HENT: Negative.   Respiratory: Negative for shortness of breath.   Cardiovascular: Negative for chest pain.  Gastrointestinal: Positive for nausea. Negative for abdominal pain and vomiting.  Genitourinary: Negative for dysuria and frequency.  Musculoskeletal: Negative for arthralgias and myalgias.  Skin: Positive for color change. Negative for wound.  Neurological: Negative for dizziness, syncope and light-headedness.  Psychiatric/Behavioral: Positive for decreased concentration and  dysphoric mood. Negative for self-injury and suicidal ideas. The patient is not nervous/anxious.   All other systems reviewed and are negative.   Physical Exam Updated Vital Signs BP (!) 127/94 (BP Location: Left Arm)   Pulse (!) 102   Temp 98.4 F (36.9 C) (Oral)   Resp 15   Ht 6' 3"  (1.905 m)   Wt 81.6 kg   SpO2 99%   BMI 22.50 kg/m   Physical Exam Vitals and nursing note reviewed.  Constitutional:      General: He is not in acute distress.    Appearance: He is well-developed. He is not diaphoretic.     Comments: Alert, very poor hygiene, with fecal matter caked on patient, tearful  HENT:     Head: Normocephalic and atraumatic.     Mouth/Throat:     Mouth: Mucous membranes are moist.     Pharynx: Oropharynx is clear.  Eyes:     General:        Right eye: No discharge.        Left eye: No discharge.  Cardiovascular:     Rate and Rhythm: Normal rate and regular rhythm.     Heart sounds: Normal heart sounds. No murmur heard. No friction rub. No gallop.   Pulmonary:     Effort: Pulmonary effort is normal. No respiratory distress.     Breath sounds: Normal breath sounds. No wheezing or rales.     Comments: Respirations equal and unlabored, patient able to speak in full sentences, lungs clear to auscultation bilaterally  Abdominal:     General: Bowel sounds are normal. There is no distension.     Palpations: Abdomen is soft. There is no mass.     Tenderness: There is no abdominal tenderness. There is no guarding.     Comments: Abdomen soft, nondistended, nontender to palpation in all quadrants without guarding or peritoneal signs   Genitourinary:    Comments: Genitals red and raw, mildly tender to the touch, redness extends back through perineum to buttocks, no fluctuance, drainage or crepitus, no swelling Musculoskeletal:        General: No deformity.     Cervical back: Neck supple.  Skin:    General: Skin is warm and dry.     Capillary Refill: Capillary refill takes  less than 2 seconds.  Neurological:     Mental Status: He is alert.     Coordination: Coordination normal.     Comments: Speech is clear, able to follow commands Moves extremities without ataxia, coordination intact  Psychiatric:        Attention and Perception: Attention normal. He does not perceive auditory or visual hallucinations.        Mood and Affect: Mood is depressed. Affect is tearful.        Speech: Speech normal.        Behavior: Behavior normal.        Thought Content: Suicidal: Patient denies specific plan  of suicide, but has not been caring for himself, showering, and reports he does not have the will or desire to do any of this anymore. Thought content does not include suicidal plan.     ED Results / Procedures / Treatments   Labs (all labs ordered are listed, but only abnormal results are displayed) Labs Reviewed  COMPREHENSIVE METABOLIC PANEL - Abnormal; Notable for the following components:      Result Value   Sodium 128 (*)    Potassium 2.8 (*)    Chloride 88 (*)    BUN <5 (*)    Total Protein 8.8 (*)    Albumin 3.2 (*)    AST 154 (*)    ALT 59 (*)    Alkaline Phosphatase 367 (*)    Total Bilirubin 2.5 (*)    Anion gap 18 (*)    All other components within normal limits  SALICYLATE LEVEL - Abnormal; Notable for the following components:   Salicylate Lvl <0.1 (*)    All other components within normal limits  ACETAMINOPHEN LEVEL - Abnormal; Notable for the following components:   Acetaminophen (Tylenol), Serum <10 (*)    All other components within normal limits  CBC - Abnormal; Notable for the following components:   RDW 16.0 (*)    Platelets 96 (*)    All other components within normal limits  MAGNESIUM - Abnormal; Notable for the following components:   Magnesium 1.4 (*)    All other components within normal limits  BASIC METABOLIC PANEL - Abnormal; Notable for the following components:   Sodium 130 (*)    Potassium 2.7 (*)    Chloride 91 (*)     Glucose, Bld 104 (*)    BUN <5 (*)    Calcium 8.5 (*)    All other components within normal limits  ETHANOL  LIPASE, BLOOD  RAPID URINE DRUG SCREEN, HOSP PERFORMED    EKG EKG Interpretation  Date/Time:  Monday Feb 07 2021 07:30:39 EDT Ventricular Rate:  99 PR Interval:  170 QRS Duration: 99 QT Interval:  390 QTC Calculation: 501 R Axis:   66 Text Interpretation: Sinus rhythm Borderline repolarization abnormality Prolonged QT interval Confirmed by Dene Gentry [phone]) on 02/07/2021 7:55:03 AM   Radiology No results found.  Procedures Procedures   Medications Ordered in ED Medications  liver oil-zinc oxide (DESITIN) 40 % ointment ( Topical Given 02/07/21 1517)  Gerhardt's butt cream 1 application (1 application Topical Given 12/06/62 4034)  folic acid (FOLVITE) tablet 1 mg (1 mg Oral Given 02/07/21 1459)  thiamine tablet 100 mg (100 mg Oral Not Given 02/07/21 1459)  potassium chloride SA (KLOR-CON) CR tablet 20 mEq (has no administration in time range)  ondansetron (ZOFRAN) injection 4 mg (4 mg Intravenous Given 02/07/21 0755)  nystatin cream (MYCOSTATIN) ( Topical Given 02/07/21 0850)  thiamine (B-1) injection 100 mg (100 mg Intravenous Given 02/07/21 0825)  dextrose 5 % and 0.9% NaCl 5-0.9 % bolus 1,000 mL (0 mLs Intravenous Stopped 02/07/21 0942)  potassium chloride SA (KLOR-CON) CR tablet 40 mEq (40 mEq Oral Given 02/07/21 0833)  potassium chloride 10 mEq in 100 mL IVPB (0 mEq Intravenous Stopped 02/07/21 0942)  magnesium sulfate IVPB 2 g 50 mL (0 g Intravenous Stopped 02/07/21 1111)  potassium chloride SA (KLOR-CON) CR tablet 40 mEq (40 mEq Oral Not Given 02/07/21 1519)    ED Course  I have reviewed the triage vital signs and the nursing notes.  Pertinent labs & imaging results that  were available during my care of the patient were reviewed by me and considered in my medical decision making (see chart for details).    MDM Rules/Calculators/A&P                         Patient is very  disheveled, and poor hygiene via EMS.  Reports he is depressed, denies specific SI or HI, reports he does not have the will to do anything anymore.  Patient was cleaned by nursing staff.  He does have significant erythema of the skin around his genitals and buttocks but does not have open wounds, drainage, swelling or crepitus in this area. Patient essentially has severe diaper rash, will treat with nystatin and barrier cream. Low suspicion for fournier's gangreene.  We will also get medical screening labs.  I have independently ordered, reviewed and interpreted all labs: CBC: No leukocytosis, normal hemoglobin CMP: Hyponatremia at 128, potassium 2.8, chloride 88, suspect electrolyte derangements in the setting of alcohol use and poor fluid and p.o. intake, normal renal function, patient also with elevation of all LFTs, AST, ALT and alk phos, similar to lab work 2 weeks ago, patient does have increased bilirubin, elevated to 2.5 today, but no associated right upper quadrant pain, suspect this is all in the setting of alcohol use.  Lipase WNL, low suspicion for choledocholithiasis.  Patient also with anion gap of 18.  Suspect this is a combination of alcoholic ketoacidosis and dehydration.  Ordered electrolyte replacement and fluids as well as thiamine.  Patient given p.o. and IV potassium as well as IV magnesium.  Given IV fluid bolus D5 normal saline.  We will then recheck electrolytes, suspect it will be improved if so patient will not need medical admission, but will need psychiatric consult.  Repeat metabolic panel significantly improved, sodium now 130, patient still with some hypokalemia 2.7, will order additional p.o. potassium supplementation.  Anion gap closed.   Patient remains awake, alert, tolerating p.o. food and fluids.  CIWA of 2 not suggesting significant alcohol withdrawal symptoms.  Will remain here in the emergency department pending TTS evaluation.  His home medications have been  restarted, and nystatin and Desitin ointments have been ordered to help with rash.  Patient has also been started on continuous potassium supplementation and will recheck potassium in the morning.  The patient has been placed in psychiatric observation due to the need to provide a safe environment for the patient while obtaining psychiatric consultation and evaluation, as well as ongoing medical and medication management to treat the patient's condition.  The patient has not been placed under full IVC at this time.   Final Clinical Impression(s) / ED Diagnoses Final diagnoses:  Depression, unspecified depression type  Alcohol abuse  Hypokalemia  Hyponatremia  Alcoholic ketoacidosis    Rx / DC Orders ED Discharge Orders    None       Janet Berlin 02/07/21 1632    Valarie Merino, MD 02/09/21 [phone]

## 2021-02-07 NOTE — ED Notes (Addendum)
Patient has reddness on the shaft of his penis, scrotum, right and left groin, and rectum. Patient was soiled all over. Patient given a bath and cleaned up. Cream put on his bottom. Md notified.

## 2021-02-07 NOTE — BH Assessment (Signed)
Comprehensive Clinical Assessment (CCA) Note  02/07/2021 Dylan Weaver [id number]  DISPOSITION: Effie Shyoleman NP recommends patient be observed and monitored.  Flowsheet Row ED [facility]07[facility]2021 [facility] from 12/28/2020 in [facility]  C-SSRS RISK CATEGORY No Risk No Risk No Risk    The patient demonstrates the following risk factors for suicide: Chronic risk factors for suicide include: substance use disorder. Acute risk factors for suicide include: social withdrawal/isolation. Protective factors for this patient include: coping skills. Considering these factors, the overall suicide risk at this point appears to be low.. Patient is not appropriate for outpatient follow up.  Dylan Weaver is an 55 y.o. male that [facility]. Patient denies any active S/I although reports passive S/I associated with the recent loss of his mother. Patient states, "I just feel I can't go on at times." Patient denies any prior attempts or gestures at self harm. Patient denies any H/I or AVH. Patient is retired and resides alone after the loss of his mother two weeks ago. Patient has a long history of alcohol use although reports that he has been using daily since that loss. Patient reports he has been consuming one bottle of wine or more with last use 48 hours ago when patient reported he drank "about one bottle or maybe more." Patient's BAL is less than 10 this date with UDS pending. Patient denies the use of any other substances. Patient denies any current withdrawals. Patient denies a current mental health diagnosis or prescribed any medications. Patient states he receives services [facility] in [place] for OP medical issues and states he would like to follow up there for OP groups to address alcohol issues. Patient reports he contacted EMS  this date to transport him to the ED after he had soiled himself due to ongoing alcohol use and "just not caring." Patient reports that he has been suffering from depression and anxiety since the passing of his mother with symptoms to include: feeling lost, isolating and hopelessness.  Patient per notes was last seen in 2021 when [facility] after suffering a [facility]. Patient denies access to firearms or history of trauma.   Per admission notes this date [name] writes: Dylan Weaver is a 55 y.o. male with alcohol abuse, who presents via EMS from home.  Reports he is extremely depressed, short and that he is getting a divorce after his wife left him and his mother died.  He reports he has not been taking care of himself.  He arrives with fecal matter caked on him.  He has not been taking his medications.  Reports drinking heavily.  Last drink a bottle of wine last night.  Significant time, SI no plan of suicide but reports he is just not taking care of himself and does not have treatment to do this anymore.  Patient was cleaned by nursing staff and has significant redness and skin breakdown over his genitals and buttocks.  He had an admission for sepsis due to skin infection in this region a few months ago.  Reports this time it is not as bad but he knows heading that direction if he did not get it taken care of.  He has not been taking any of his medications and has not been eating well, just primarily drinking alcohol.  No other aggravating or alleviating factors.  Patient  was alert and oriented x 5. Patient presents with depressed mood with affect congruent. Patient's memory appears to be intact with thoughts organized. Patient does not appear to be responding to internal stimuli.  Chief Complaint:  Chief Complaint  Patient presents with  . Alcohol Intoxication  . Anxiety    Binge drinking   Visit Diagnosis: Adjustment disorder, Alcohol abuse     CCA Screening,  Triage and Referral (STR)  Patient Reported Information How did you hear about [place]? Self  Referral name: No data recorded Referral phone number: No data recorded  Whom do you see for routine medical problems? I don't have a doctor  Practice/Facility Name: No data recorded Practice/Facility Phone Number: No data recorded Name of Contact: No data recorded Contact Number: No data recorded Contact Fax Number: No data recorded Prescriber Name: No data recorded Prescriber Address (if known): No data recorded  What Is the Reason for Your Visit/Call Today? Passive S/I and ongoing alcohol use  How Long Has This Been Causing You Problems? 1 wk - 1 month  What Do You Feel Would Help You the Most Today? Stress Management   Have You Recently Been in Any Inpatient Treatment (Hospital/Detox/Crisis Center/28-Day Program)? No  Name/Location of Program/Hospital:No data recorded How Long Were You There? No data recorded When Were You Discharged? No data recorded  Have You Ever Received Services From [facility] Before? No  Who Do You See at [facility]? No data recorded  Have You Recently Had Any Thoughts About Hurting Yourself? No  Are You Planning to Commit Suicide/Harm Yourself At This time? No   Have you Recently Had Thoughts About Hurting Someone Karolee Ohs? No  Explanation: No data recorded  Have You Used Any Alcohol or Drugs in the Past 24 Hours? No  How Long Ago Did You Use Drugs or Alcohol? No data recorded What Did You Use and How Much? No data recorded  Do You Currently Have a Therapist/Psychiatrist? No  Name of Therapist/Psychiatrist: No data recorded  Have You Been Recently Discharged From Any Office Practice or Programs? No  Explanation of Discharge From Practice/Program: No data recorded    CCA Screening Triage Referral Assessment Type of Contact: Face-to-Face  Is this Initial or Reassessment? No data recorded Date Telepsych consult ordered in [facility]:  No data  recorded Time Telepsych consult ordered in [facility]:  No data recorded  Patient Reported Information Reviewed? Yes  Patient Left Without Being Seen? No data recorded Reason for Not Completing Assessment: No data recorded  Collateral Involvement: None at this time   Does Patient Have a Court Appointed Legal Guardian? No data recorded Name and Contact of Legal Guardian: No data recorded If Minor and Not Living with Parent(s), Who has Custody? No data recorded Is CPS involved or ever been involved? Never  Is APS involved or ever been involved? Never   Patient Determined To Be At Risk for Harm To Self or Others Based on Review of Patient Reported Information or Presenting Complaint? Yes, for Self-Harm  Method: No data recorded Availability of Means: No data recorded Intent: No data recorded Notification Required: No data recorded Additional Information for Danger to Others Potential: No data recorded Additional Comments for Danger to Others Potential: No data recorded Are There Guns or Other Weapons in Your Home? No data recorded Types of Guns/Weapons: No data recorded Are These Weapons Safely Secured?  No data recorded Who Could Verify You Are Able To Have These Secured: No data recorded Do You Have any Outstanding Charges, Pending Court Dates, Parole/Probation? No data recorded Contacted To Inform of Risk of Harm To Self or Others: Other: Comment (NA)   Location of Assessment: [facility] ED   Does Patient Present under Involuntary Commitment? No  IVC Papers Initial File Date: No data recorded  [place] of Residence: [place]   Patient Currently Receiving the Following Services: Not Receiving Services   Determination of Need: Urgent (48 hours)   Options For Referral: Outpatient Therapy     CCA Biopsychosocial Intake/Chief Complaint:  Passive S/I, ongoing alcohol use and anxiety  Current Symptoms/Problems: Depression and anxiety   Patient Reported  Schizophrenia/Schizoaffective Diagnosis in Past: No   Strengths: Pt is willing to participate in treatment  Preferences: pt is wanting to go to the [place] for OP services to assist with ETOH issues  Abilities: No data recorded  Type of Services Patient Feels are Needed: OP services and possibly medication management   Initial Clinical Notes/Concerns: No data recorded  Mental Health Symptoms Depression:  Change in energy/activity   Duration of Depressive symptoms: Greater than two weeks   Mania:  Change in energy/activity   Anxiety:   Difficulty concentrating   Psychosis:  None   Duration of Psychotic symptoms: No data recorded  Trauma:  None   Obsessions:  None   Compulsions:  None   Inattention:  None   Hyperactivity/Impulsivity:  N/A   Oppositional/Defiant Behaviors:  None   Emotional Irregularity:  Chronic feelings of emptiness   Other Mood/Personality Symptoms:  No data recorded   Mental Status Exam Appearance and self-care  Stature:  Average   Weight:  Average weight   Clothing:  Careless/inappropriate   Grooming:  Neglected   Cosmetic use:  None   Posture/gait:  Normal   Motor activity:  Not Remarkable   Sensorium  Attention:  Normal   Concentration:  Normal   Orientation:  X5   Recall/memory:  Normal   Affect and Mood  Affect:  Appropriate   Mood:  Anxious; Depressed   Relating  Eye contact:  Normal   Facial expression:  Anxious; Depressed   Attitude toward examiner:  Cooperative   Thought and Language  Speech flow: Clear and Coherent   Thought content:  Appropriate to Mood and Circumstances   Preoccupation:  None   Hallucinations:  None   Organization:  No data recorded  [employer] of Knowledge:  Fair   Intelligence:  Average   Abstraction:  Normal   Judgement:  Fair   Dance movement psychotherapist:  Realistic   Insight:  Fair   Decision Making:  Normal   Social Functioning  Social Maturity:  Responsible    Social Judgement:  Normal   Stress  Stressors:  Grief/losses   Coping Ability:  Exhausted   Skill Deficits:  Interpersonal   Supports:  Support needed     Religion: Religion/Spirituality Are You A Religious Person?: No  Leisure/Recreation: Leisure / Recreation Do You Have Hobbies?: No  Exercise/Diet: Exercise/Diet Do You Exercise?: No Have You Gained or Lost A Significant Amount of Weight in the Past Six Months?: No Do You Follow a Special Diet?: No Do You Have Any Trouble Sleeping?: No   CCA Employment/Education Employment/Work Situation: Employment / Work Situation Employment situation: Retired  Programme researcher, broadcasting/film/video: Education Did Theme park manager?: No Did Designer, television/film set?: No Did You Have An Individualized Education Program (IIEP):  No Did You Have Any Difficulty At School?: No Patient's Education Has Been Impacted by Current Illness: No   CCA Family/Childhood History Family and Relationship History: Family history Marital status: Divorced Does patient have children?: No  Childhood History:  Childhood History Does patient have siblings?: No Did patient suffer any verbal/emotional/physical/sexual abuse as a child?: No Did patient suffer from severe childhood neglect?: No Has patient ever been sexually abused/assaulted/raped as an adolescent or adult?: No Was the patient ever a victim of a crime or a disaster?: No Witnessed domestic violence?: No Has patient been affected by domestic violence as an adult?: No  Child/Adolescent Assessment:     CCA Substance Use Alcohol/Drug Use: Alcohol / Drug Use Pain Medications: See MAR Prescriptions: See MAR Over the Counter: See MAR History of alcohol / drug use?: Yes Longest period of sobriety (when/how long): Unknown Negative Consequences of Use: Personal relationships Withdrawal Symptoms: Agitation Substance #1 Name of Substance 1: Alcohol 1 - Age of First Use: 17 1 - Amount (size/oz): Varies 1 -  Frequency: Varies 1 - Duration: Ongoing 1 - Last Use / Amount: 48 hours ago 1 bottle of wine                       ASAM's:  Six Dimensions of Multidimensional Assessment  Dimension 1:  Acute Intoxication and/or Withdrawal Potential:   Dimension 1:  Description of individual's past and current experiences of substance use and withdrawal: 2  Dimension 2:  Biomedical Conditions and Complications:   Dimension 2:  Description of patient's biomedical conditions and  complications: 2  Dimension 3:  Emotional, Behavioral, or Cognitive Conditions and Complications:  Dimension 3:  Description of emotional, behavioral, or cognitive conditions and complications: 2  Dimension 4:  Readiness to Change:  Dimension 4:  Description of Readiness to Change criteria: 3  Dimension 5:  Relapse, Continued use, or Continued Problem Potential:  Dimension 5:  Relapse, continued use, or continued problem potential critiera description: 2  Dimension 6:  Recovery/Living Environment:  Dimension 6:  Recovery/Iiving environment criteria description: 3  ASAM Severity Score: ASAM's Severity Rating Score: 14  ASAM Recommended Level of Treatment:     Substance use Disorder (SUD)    Recommendations for Services/Supports/Treatments:    DSM5 Diagnoses: Patient Active Problem List   Diagnosis Date Noted  . SIRS (systemic inflammatory response syndrome) (HCC) 11/29/2020  . Elevated LFTs 11/29/2020  . Intertrigo 11/29/2020  . Prolonged QT interval 11/29/2020  . Malnutrition of moderate degree 09/16/2020  . Alcohol withdrawal (HCC) 09/14/2020  . Alcoholic hepatitis 09/14/2020  . Thrombocytopenia (HCC) 09/14/2020  . Normochromic normocytic anemia 09/14/2020  . Alcohol use disorder, severe, dependence (HCC) 06/19/2020  . Alcohol abuse with intoxication (HCC) 06/19/2020    Patient Centered Plan: Patient is on the following Treatment Plan(s):     Referrals to Alternative Service(s): Referred to Alternative  Service(s):   Place:   Date:   Time:    Referred to Alternative Service(s):   Place:   Date:   Time:    Referred to Alternative Service(s):   Place:   Date:   Time:    Referred to Alternative Service(s):   Place:   Date:   Time:     Alfredia Ferguson, LCAS

## 2021-02-07 NOTE — BH Assessment (Addendum)
Per Pollyann Glen, LCAS, patient's TTS Assessment was discussed with provider Effie Shy, NP), whom recommended patient be observed and monitored.   Per Eber Jones, NP patient is appropriate for transfer to the [facility]. Followed up with Nira Conn, NP to request if patient would be accepted to the [facility] for his overnight observation.   Patient reviewed by the [facility] providers and deemed inappropriate for [facility] admission due to medical needs (limited ADL's, needs hospital bed, etc). Patient to remain in the ED to be re-evaluated in the am by TTS and/or provider.

## 2021-02-07 NOTE — ED Notes (Signed)
Patient requesting emesis bag for nausea and a diet order. PA made aware

## 2021-02-07 NOTE — ED Notes (Signed)
Pt resting comfortably and denies pain. Pt provided with sandwich

## 2021-02-07 NOTE — ED Notes (Signed)
Patient provided diet coke. Pt tolerating well

## 2021-02-07 NOTE — ED Triage Notes (Signed)
Patient brought in by [facility] from home. Patient has a hx of neuropathy. Patient is getting a divorce and patients mom died. Patient takes sodium pills and has not taken them. Patient has old scars. Patient is not taking care of himself. He has fecal matter on him.

## 2021-02-08 LAB — POTASSIUM: Potassium: 3.1 mmol/L — ABNORMAL LOW (ref 3.5–5.1)

## 2021-02-08 MED ORDER — ACETAMINOPHEN 325 MG PO TABS
650.0000 mg | ORAL_TABLET | Freq: Once | ORAL | Status: AC
  Administered 2021-02-08: 650 mg via ORAL
  Filled 2021-02-08: qty 2

## 2021-02-08 MED ORDER — POTASSIUM CHLORIDE CRYS ER 20 MEQ PO TBCR: 20.0000 meq | EXTENDED_RELEASE_TABLET | Freq: Every day | ORAL | 0 refills | Status: DC

## 2021-02-08 NOTE — ED Notes (Signed)
Pt denies SI.  

## 2021-02-08 NOTE — ED Notes (Signed)
Pt used bedpan. RN assisted pt with hygiene and changed pad on bed.

## 2021-02-08 NOTE — ED Notes (Signed)
Patient given bus pass and went over paperwork.

## 2021-02-08 NOTE — ED Notes (Signed)
Pt provided cup of soda per pt request.

## 2021-02-08 NOTE — ED Notes (Addendum)
Pt given water. Pt denies pain and is resting comfortably.

## 2021-02-08 NOTE — Discharge Instructions (Signed)
Follow back up with your family doctor for recheck in a week.  Follow-up with behavioral health for alcohol abuse

## 2021-02-08 NOTE — ED Notes (Signed)
Pt given socks and burgundy scrub pants to put on. Pt resting comfortably

## 2021-02-08 NOTE — ED Provider Notes (Signed)
Patient is not suicidal or homicidal.  He is medically cleared and will be discharged home with referrals to behavioral health and a primary care doctor   Bethann Berkshire, MD 02/08/21 1010

## 2021-03-13 ENCOUNTER — Emergency Department (HOSPITAL_BASED_OUTPATIENT_CLINIC_OR_DEPARTMENT_OTHER)
Admission: EM | Admit: 2021-03-13 | Discharge: 2021-03-13 | Disposition: A | Discharge status: Home / Self Care | Payer: No Typology Code available for payment source | Attending: Emergency Medicine | Admitting: Emergency Medicine

## 2021-03-13 ENCOUNTER — Encounter (HOSPITAL_BASED_OUTPATIENT_CLINIC_OR_DEPARTMENT_OTHER): Payer: Self-pay | Admitting: Emergency Medicine

## 2021-03-13 ENCOUNTER — Emergency Department (HOSPITAL_BASED_OUTPATIENT_CLINIC_OR_DEPARTMENT_OTHER): Payer: No Typology Code available for payment source

## 2021-03-13 ENCOUNTER — Other Ambulatory Visit: Payer: Self-pay

## 2021-03-13 DIAGNOSIS — R059 Cough, unspecified: Secondary | ICD-10-CM

## 2021-03-13 DIAGNOSIS — S3992XA Unspecified injury of lower back, initial encounter: Secondary | ICD-10-CM | POA: Diagnosis present

## 2021-03-13 DIAGNOSIS — S31819A Unspecified open wound of right buttock, initial encounter: Secondary | ICD-10-CM

## 2021-03-13 DIAGNOSIS — S80212A Abrasion, left knee, initial encounter: Secondary | ICD-10-CM | POA: Insufficient documentation

## 2021-03-13 DIAGNOSIS — F17200 Nicotine dependence, unspecified, uncomplicated: Secondary | ICD-10-CM | POA: Diagnosis not present

## 2021-03-13 DIAGNOSIS — Z20822 Contact with and (suspected) exposure to covid-19: Secondary | ICD-10-CM | POA: Diagnosis not present

## 2021-03-13 DIAGNOSIS — Y903 Blood alcohol level of 60-79 mg/100 ml: Secondary | ICD-10-CM | POA: Insufficient documentation

## 2021-03-13 DIAGNOSIS — F10129 Alcohol abuse with intoxication, unspecified: Secondary | ICD-10-CM | POA: Diagnosis not present

## 2021-03-13 DIAGNOSIS — S80211A Abrasion, right knee, initial encounter: Secondary | ICD-10-CM | POA: Insufficient documentation

## 2021-03-13 DIAGNOSIS — W19XXXA Unspecified fall, initial encounter: Secondary | ICD-10-CM | POA: Diagnosis not present

## 2021-03-13 DIAGNOSIS — Y92524 Gas station as the place of occurrence of the external cause: Secondary | ICD-10-CM | POA: Diagnosis not present

## 2021-03-13 DIAGNOSIS — F1010 Alcohol abuse, uncomplicated: Secondary | ICD-10-CM

## 2021-03-13 LAB — CBC WITH DIFFERENTIAL/PLATELET
Abs Immature Granulocytes: 0.08 10*3/uL — ABNORMAL HIGH (ref 0.00–0.07)
Basophils Absolute: 0.1 10*3/uL (ref 0.0–0.1)
Basophils Relative: 1 %
Eosinophils Absolute: 0.5 10*3/uL (ref 0.0–0.5)
Eosinophils Relative: 5 %
HCT: 35 % — ABNORMAL LOW (ref 39.0–52.0)
Hemoglobin: 12.1 g/dL — ABNORMAL LOW (ref 13.0–17.0)
Immature Granulocytes: 1 %
Lymphocytes Relative: 10 %
Lymphs Abs: 1 10*3/uL (ref 0.7–4.0)
MCH: 33.6 pg (ref 26.0–34.0)
MCHC: 34.6 g/dL (ref 30.0–36.0)
MCV: 97.2 fL (ref 80.0–100.0)
Monocytes Absolute: 0.9 10*3/uL (ref 0.1–1.0)
Monocytes Relative: 9 %
Neutro Abs: 7.1 10*3/uL (ref 1.7–7.7)
Neutrophils Relative %: 74 %
Platelets: 161 10*3/uL (ref 150–400)
RBC: 3.6 MIL/uL — ABNORMAL LOW (ref 4.22–5.81)
RDW: 14.3 % (ref 11.5–15.5)
WBC: 9.6 10*3/uL (ref 4.0–10.5)
nRBC: 0 % (ref 0.0–0.2)

## 2021-03-13 LAB — COMPREHENSIVE METABOLIC PANEL
ALT: 38 U/L (ref 0–44)
AST: 86 U/L — ABNORMAL HIGH (ref 15–41)
Albumin: 2.8 g/dL — ABNORMAL LOW (ref 3.5–5.0)
Alkaline Phosphatase: 294 U/L — ABNORMAL HIGH (ref 38–126)
Anion gap: 16 — ABNORMAL HIGH (ref 5–15)
BUN: 5 mg/dL — ABNORMAL LOW (ref 6–20)
CO2: 24 mmol/L (ref 22–32)
Calcium: 7.9 mg/dL — ABNORMAL LOW (ref 8.9–10.3)
Chloride: 92 mmol/L — ABNORMAL LOW (ref 98–111)
Creatinine, Ser: 0.7 mg/dL (ref 0.61–1.24)
GFR, Estimated: >60 mL/min (ref >60)
Glucose, Bld: 127 mg/dL — ABNORMAL HIGH (ref 70–99)
Potassium: 3 mmol/L — ABNORMAL LOW (ref 3.5–5.1)
Sodium: 132 mmol/L — ABNORMAL LOW (ref 135–145)
Total Bilirubin: 1.3 mg/dL — ABNORMAL HIGH (ref 0.3–1.2)
Total Protein: 7.7 g/dL (ref 6.5–8.1)

## 2021-03-13 LAB — ETHANOL: Alcohol, Ethyl (B): 77 mg/dL — ABNORMAL HIGH (ref <10)

## 2021-03-13 LAB — RESP PANEL BY RT-PCR (FLU A&B, COVID) ARPGX2
Influenza A by PCR: NEGATIVE
Influenza B by PCR: NEGATIVE
SARS Coronavirus 2 by RT PCR: NEGATIVE

## 2021-03-13 MED ORDER — DOXYCYCLINE HYCLATE 100 MG PO CAPS: 100.0000 mg | ORAL_CAPSULE | Freq: Two times a day (BID) | ORAL | 0 refills | Status: DC

## 2021-03-13 MED ORDER — SODIUM CHLORIDE 0.9 % IV BOLUS (SEPSIS)
1000.0000 mL | Freq: Once | INTRAVENOUS | Status: AC
  Administered 2021-03-13: 1000 mL via INTRAVENOUS

## 2021-03-13 MED ORDER — SODIUM CHLORIDE 0.9 % IV SOLN
1000.0000 mL | INTRAVENOUS | Status: DC
  Administered 2021-03-13: 1000 mL via INTRAVENOUS

## 2021-03-13 NOTE — ED Triage Notes (Signed)
Pt noted to have a large wound on R buttocks. He is unsure when it happened exactly but guesses at "a week ago".

## 2021-03-13 NOTE — ED Notes (Signed)
Registration called out, saying pt would not get into cab. This RN and EMT went to check on pt with security, pt had one leg inside the car and one on the ground, his upper body on the car seat; pt was holding his weight up with both legs, no shaking, but said "I can't get in"; EDP consulted, said pt was medically clear; police called, this RN and EMT stayed with pt, pt refused to get back into wheelchair; [employer] arrived, said they would take pt to his car; pt able to get out of cab and into police car without assistance, gait steady

## 2021-03-13 NOTE — ED Provider Notes (Addendum)
[facility] EMERGENCY DEPARTMENT Provider Note  CSN: [id number] Arrival date & time: 03/13/21 [id number]  Chief Complaint(s) Alcohol Intoxication ED Triage Notes Dylan Weaver, [name], RN (Registered Nurse)   Emergency Medicine   Date of Service: 03/13/2021  1:12 AM   Signed   [facility] at [employer]. Pt is intoxicated and admits to "6 beers" today. States he normally drinks 10 beers a day. Pt also mentions "I want a covid test. I've been coughing a lot." Abrasions to BIL knees. Some appear old/scabbed. Denies hitting his head. Pt keys taken to security office.     HPI Dylan Weaver Dylan Weaver is a 55 y.o. male here with alcohol intoxication.  Reportedly fell at gas station. No head trauma or LOC. Has old wounds from prior falls. Reports coughing.   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 Authorizing Provider  doxycycline (VIBRAMYCIN) 100 MG capsule Take 1 capsule (100 mg total) by mouth 2 (two) times daily. 03/13/21  Yes Karim Aiello, Amadeo GarnetPedro Eduardo, MD  folic acid (FOLVITE) 1 MG tablet Take 1 tablet (1 mg total) by mouth daily. 12/04/20   Barnetta Chapelgbata, Sylvester I, MD  ketoconazole (NIZORAL) 2 % cream Apply topically 2 (two) times daily. Patient not taking: No sig reported 12/04/20   Barnetta Chapelgbata, Sylvester I, MD   liver oil-zinc oxide (DESITIN) 40 % ointment Apply topically 2 (two) times daily. Patient not taking: No sig reported 12/04/20   Barnetta Chapelgbata, Sylvester I, MD  Multiple Vitamin (MULTIVITAMIN WITH MINERALS) TABS tablet Take 1 tablet by mouth daily before breakfast. Patient not taking: No sig reported 12/04/20   Berton Mountgbata, Sylvester I, MD  Nystatin (GERHARDT'S BUTT CREAM) CREA Apply 1 application topically 2 (two) times daily. Patient not taking: No sig reported 12/04/20   Berton Mountgbata, Sylvester I, MD  potassium chloride SA (KLOR-CON) 20 MEQ tablet Take 1 tablet (20 mEq total) by mouth daily. 02/08/21   Bethann BerkshireZammit, Joseph, MD  thiamine 100 MG tablet Take 1 tablet (100 mg total) by mouth daily. 12/04/20   Barnetta Chapelgbata, Sylvester I, MD                                                                                                                                    Past Surgical History Past Surgical History:  Procedure  Laterality Date   NASAL SEPTUM SURGERY     WRIST SURGERY     Family History Family History  Family history unknown: Yes    Social History Social History   Tobacco Use   Smoking status: Every Day    Pack years: 0.00   Smokeless tobacco: Never  Vaping Use   Vaping Use: Never used  Substance Use Topics   Alcohol use: Yes   Drug use: Never   Allergies Patient has no known allergies.  Review of Systems Review of Systems All other systems are reviewed and are negative for acute change except as noted in the HPI  Physical Exam Vital Signs  I have reviewed the triage vital signs BP 123/68   Pulse (!) 116   Temp 99.4 F (37.4 C) (Oral)   Resp 20   Ht 6' (1.829 m)   Wt 79.4 kg   SpO2 100%   BMI 23.73 kg/m   Physical Exam Vitals reviewed.  Constitutional:      General: He is not in acute distress.    Appearance: He is well-developed. He is not diaphoretic.  HENT:     Head: Normocephalic and atraumatic.     Nose: Nose normal.  Eyes:     General: No scleral icterus.       Right eye: No  discharge.        Left eye: No discharge.     Conjunctiva/sclera: Conjunctivae normal.     Pupils: Pupils are equal, round, and reactive to light.  Cardiovascular:     Rate and Rhythm: Normal rate and regular rhythm.     Heart sounds: No murmur heard.   No friction rub. No gallop.  Pulmonary:     Effort: Pulmonary effort is normal. No respiratory distress.     Breath sounds: Normal breath sounds. No stridor. No rales.  Abdominal:     General: There is no distension.     Palpations: Abdomen is soft.     Tenderness: There is no abdominal tenderness.  Genitourinary:   Musculoskeletal:        General: No tenderness.     Cervical back: Normal range of motion and neck supple.  Skin:    General: Skin is warm and dry.     Findings: No erythema or rash.  Neurological:     Mental Status: He is alert and oriented to person, place, and time.     Comments: Moves all extremities with 5/5 strength.     ED Results and Treatments Labs (all labs ordered are listed, but only abnormal results are displayed) Labs Reviewed  COMPREHENSIVE METABOLIC PANEL - Abnormal; Notable for the following components:      Result Value   Sodium 132 (*)    Potassium 3.0 (*)    Chloride 92 (*)    Glucose, Bld 127 (*)    BUN 5 (*)    Calcium 7.9 (*)    Albumin 2.8 (*)    AST 86 (*)    Alkaline Phosphatase 294 (*)    Total Bilirubin 1.3 (*)    Anion gap 16 (*)    All other components within normal limits  ETHANOL - Abnormal; Notable for the following components:   Alcohol, Ethyl (B) 77 (*)    All other components within normal limits  CBC WITH DIFFERENTIAL/PLATELET - Abnormal; Notable for the following components:   RBC 3.60 (*)    Hemoglobin 12.1 (*)    HCT 35.0 (*)    Abs Immature  Granulocytes 0.08 (*)    All other components within normal limits  RESP PANEL BY RT-PCR (FLU A&B, COVID) [id number]                                                                                                                          EKG  EKG Interpretation  Date/Time:  Sunday March 13 2021 02:23:52 EDT Ventricular Rate:  115 PR Interval:  140 QRS Duration: 94 QT Interval:  336 QTC Calculation: 467 R Axis:   56 Text Interpretation: Sinus tachycardia Borderline low voltage, extremity leads Repol abnrm suggests ischemia, anterolateral No acute changes Confirmed by Drema Pry [phone]) on 03/13/2021 4:22:24 AM        Radiology DG Chest Port 1 View  Result Date: 03/13/2021 CLINICAL DATA:  Cough EXAM: PORTABLE CHEST 1 VIEW COMPARISON:  01/24/2021 FINDINGS: Heart and mediastinal contours are within normal limits. No focal opacities or effusions. No acute bony abnormality. IMPRESSION: No active disease. Electronically Signed   By: Charlett Nose M.D.   On: 03/13/2021 02:59    Pertinent labs & imaging results that were available during my care of the patient were reviewed by me and considered in my medical decision making (see chart for details).  Medications Ordered in ED Medications  sodium chloride 0.9 % bolus 1,000 mL (0 mLs Intravenous Stopped 03/13/21 0322)    Followed by  0.9 %  sodium chloride infusion (1,000 mLs Intravenous New Bag/Given 03/13/21 0323)                                                                                                                                    Procedures Procedures  (including critical care time)  Medical Decision Making / ED Course I have reviewed the nursing notes for this encounter and the patient's prior records (if available in EHR or on provided paperwork).   Bricyn Labrada Myles was evaluated in Emergency Department on 03/13/2021 for the symptoms described in the history of present illness. He was evaluated in the context of the global COVID-19 pandemic, which necessitated consideration that the patient might be at risk for infection with the SARS-CoV-2 virus that causes COVID-19. Institutional protocols and algorithms that pertain to the evaluation of  patients at risk for COVID-19 are in a state of rapid change based on information released by regulatory bodies including the CDC and federal and state organizations. These policies and  algorithms were followed during the patient's care in the ED.  Etoh. No head trauma. Multiple remote wounds on lower legs. Right buttock wound old. Possibly infected. COVID negative. CXR w/o PNA Labs at baseline and reassuring. Refused to stand, stating "I can do it in the morning." Suspicious for secondary gain for overnight shelter.     Final Clinical Impression(s) / ED Diagnoses Final diagnoses:  Cough  Alcohol abuse  Buttock wound, right, initial encounter   The patient appears reasonably screened and/or stabilized for discharge and I doubt any other medical condition or other [facility] requiring further screening, evaluation, or treatment in the ED at this time prior to discharge. Safe for discharge with strict return precautions.  Disposition: Discharge  Condition: Good  I have discussed the results, Dx and Tx plan with the patient/family who expressed understanding and agree(s) with the plan. Discharge instructions discussed at length. The patient/family was given strict return precautions who verbalized understanding of the instructions. No further questions at time of discharge.    ED Discharge Orders          Ordered    doxycycline (VIBRAMYCIN) 100 MG capsule  2 times daily        03/13/21 0423             Follow Up: [facility] [address] [facility] [place] [address] [phone]  Call  as needed   Security had to be called to escort patient out.  Patient refused to stay in and get into the cab.  Patient reportedly held himself up with torso on the chair and lower extremities extended out holding his body prolonged period of time.  Patient continued to request oral hydration.  Tech reports that he negotiated with the patient and told him that if he  gets out of the cab but he will get water.  Patient reportedly was able to get out without assistance and without significant effort.  Patient eventually had to be escorted off premises by PD.   This chart was dictated using voice recognition software.  Despite best efforts to proofread,  errors can occur which can change the documentation meaning.      Nira Conn, MD 03/13/21 [phone]

## 2021-03-13 NOTE — ED Triage Notes (Signed)
Pt arrives by [facility] after a fall at [place]. Pt is intoxicated and admits to "6 beers" today. States he normally drinks 10 beers a day. Pt also mentions "I want a covid test. I've been coughing a lot." Abrasions to BIL knees. Some appear old/scabbed. Denies hitting his head. Pt keys taken to security office.

## 2021-03-13 NOTE — ED Notes (Addendum)
Pt able to lift legs without assistance, able to get up out of the bed without assistance and was able to ambulate to wheelchair without assistance. Pt said "I can't walk", but pt was able to ambulate despite this claim; EDP and security at bedside during ambulation/discharge; pt given cab voucher

## 2021-03-14 ENCOUNTER — Encounter (HOSPITAL_BASED_OUTPATIENT_CLINIC_OR_DEPARTMENT_OTHER): Payer: Self-pay | Admitting: *Deleted

## 2021-03-14 ENCOUNTER — Emergency Department (HOSPITAL_BASED_OUTPATIENT_CLINIC_OR_DEPARTMENT_OTHER): Payer: No Typology Code available for payment source

## 2021-03-14 ENCOUNTER — Emergency Department (HOSPITAL_BASED_OUTPATIENT_CLINIC_OR_DEPARTMENT_OTHER)
Admission: EM | Admit: 2021-03-14 | Discharge: 2021-03-14 | Disposition: A | Discharge status: Home / Self Care | Payer: No Typology Code available for payment source | Attending: Emergency Medicine | Admitting: Emergency Medicine

## 2021-03-14 ENCOUNTER — Other Ambulatory Visit: Payer: Self-pay

## 2021-03-14 DIAGNOSIS — G8929 Other chronic pain: Secondary | ICD-10-CM | POA: Insufficient documentation

## 2021-03-14 DIAGNOSIS — S3992XA Unspecified injury of lower back, initial encounter: Secondary | ICD-10-CM | POA: Diagnosis present

## 2021-03-14 DIAGNOSIS — F17210 Nicotine dependence, cigarettes, uncomplicated: Secondary | ICD-10-CM | POA: Insufficient documentation

## 2021-03-14 DIAGNOSIS — X58XXXA Exposure to other specified factors, initial encounter: Secondary | ICD-10-CM | POA: Insufficient documentation

## 2021-03-14 DIAGNOSIS — S31819A Unspecified open wound of right buttock, initial encounter: Secondary | ICD-10-CM | POA: Diagnosis not present

## 2021-03-14 DIAGNOSIS — M25561 Pain in right knee: Secondary | ICD-10-CM | POA: Insufficient documentation

## 2021-03-14 DIAGNOSIS — S31819D Unspecified open wound of right buttock, subsequent encounter: Secondary | ICD-10-CM

## 2021-03-14 MED ORDER — DOXYCYCLINE HYCLATE 100 MG PO TABS
100.0000 mg | ORAL_TABLET | Freq: Once | ORAL | Status: AC
  Administered 2021-03-14: 100 mg via ORAL
  Filled 2021-03-14: qty 1

## 2021-03-14 MED ORDER — DOXYCYCLINE HYCLATE 100 MG PO CAPS
100.0000 mg | ORAL_CAPSULE | Freq: Two times a day (BID) | ORAL | 0 refills | Status: DC
Start: 2021-03-14 — End: 2021-07-05

## 2021-03-14 NOTE — ED Triage Notes (Signed)
Pt arrived via [facility] with c/o right knee pain from a previous fall. Right knee with abrasion noted. Also c/o of wound to right buttocks area and rash to groin area. This is not new, but states getting worse. Denies any other complaints. Pt.states he drinks etoh daily.

## 2021-03-14 NOTE — Discharge Instructions (Addendum)
Take antibiotic as previously prescribed.  Strongly recommend following up with a wound care provider and your primary care doctor.  Call the number provided to schedule an appointment.  If you develop a fever, worsening redness, purulent drainage, or other new concerning symptom, come back to ER for reassessment.

## 2021-03-14 NOTE — ED Provider Notes (Signed)
[facility] EMERGENCY DEPT Provider Note   CSN: [id number] Arrival date & time: 03/14/21  0154     History Chief Complaint  Patient presents with   Other    Skin issues     Dylan Weaver is a 55 y.o. male.  Completed chart review.  Reviewed note from yesterday morning by Dr. Eudelia Bunch.  Patient was intoxicated, requesting COVID test, noted to have chronic appearing right buttock wound.  Basic labs stable, chest x-ray negative.  Given antibiotic to cover possible infection his chronic buttock wound.  Patient was escorted off premises by PD.  Patient states that since his evaluation around 24 hours ago not much is changed.  He is not aware of any worsening of his wounds.  He denies any new falls.  He does not have any new medical complaints at present.  Does endorse having chronic right knee pain from prior fall.  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   Thrombocytopenia (HCC) 09/14/2020   Normochromic normocytic anemia 09/14/2020   Alcohol use disorder, severe, dependence (HCC) 06/19/2020   Alcohol abuse with intoxication (HCC) 06/19/2020    Past Surgical History:  Procedure Laterality Date   NASAL SEPTUM SURGERY     WRIST SURGERY         Family History  Family history unknown: Yes    Social History   Tobacco Use   Smoking status: Every Day    Pack years: 0.00   Smokeless tobacco: Never  Vaping Use   Vaping Use: Never used  Substance Use Topics   Alcohol use: Yes    Comment: 12 pack a day beer   Drug use: Never    Home Medications Prior to Admission medications   Medication Sig Start  Date End Date Taking? Authorizing Provider  doxycycline (VIBRAMYCIN) 100 MG capsule Take 1 capsule (100 mg total) by mouth 2 (two) times daily. 03/13/21   Nira Conn, MD  folic acid (FOLVITE) 1 MG tablet Take 1 tablet (1 mg total) by mouth daily. 12/04/20   Barnetta Chapel, MD  ketoconazole (NIZORAL) 2 % cream Apply topically 2 (two) times daily. Patient not taking: No sig reported 12/04/20   Barnetta Chapel, MD  liver oil-zinc oxide (DESITIN) 40 % ointment Apply topically 2 (two) times daily. Patient not taking: No sig reported 12/04/20   Barnetta Chapel, MD  Multiple Vitamin (MULTIVITAMIN WITH MINERALS) TABS tablet Take 1 tablet by mouth daily before breakfast. Patient not taking: No sig reported 12/04/20   Berton Mount I, MD  Nystatin (GERHARDT'S BUTT CREAM) CREA Apply 1 application topically 2 (two) times daily. Patient not taking: No sig reported 12/04/20   Berton Mount I, MD  potassium chloride SA (KLOR-CON) 20 MEQ tablet Take 1 tablet (20 mEq total) by mouth daily. 02/08/21   Bethann Berkshire, MD  thiamine 100 MG tablet Take 1 tablet (100 mg total) by mouth daily. 12/04/20   Barnetta Chapel, MD    Allergies    Patient has no known allergies.  Review of Systems   Review of Systems  Constitutional:  Negative for chills and fever.  HENT:  Negative for ear pain and sore throat.   Eyes:  Negative for pain and visual disturbance.  Respiratory:  Negative for cough and shortness of breath.   Cardiovascular:  Negative for chest pain and palpitations.  Gastrointestinal:  Negative for abdominal pain and vomiting.  Genitourinary:  Negative for dysuria and hematuria.  Musculoskeletal:  Positive for arthralgias. Negative for back pain.  Skin:  Positive for wound. Negative for color change and rash.  Neurological:  Negative for seizures and syncope.  All other systems reviewed and are negative.  Physical Exam Updated Vital Signs Ht 6' (1.829 m)   Wt 81.6 kg   BMI  24.41 kg/m   Physical Exam Vitals and nursing note reviewed.  Constitutional:      Appearance: He is well-developed.     Comments: Disheveled, no acute distress  HENT:     Head: Normocephalic and atraumatic.  Eyes:     Conjunctiva/sclera: Conjunctivae normal.  Cardiovascular:     Rate and Rhythm: Normal rate and regular rhythm.     Heart sounds: No murmur heard. Pulmonary:     Effort: Pulmonary effort is normal. No respiratory distress.     Breath sounds: Normal breath sounds.  Abdominal:     Palpations: Abdomen is soft.     Tenderness: There is no abdominal tenderness.  Genitourinary:    Comments: RN chaperone present  Approximately 5 cm long V-shaped chronic appearing wound, full skin thickness along right buttock, slight surrounding erythema, no induration, no purulence Musculoskeletal:     Cervical back: Neck supple.     Comments: Scattered old abrasions over both legs, no significant swelling or focal tenderness over the knee.  Normal ROM.  Distal sensation, strength, pulses intact.  Skin:    General: Skin is warm and dry.  Neurological:     Mental Status: He is alert.    ED Results / Procedures / Treatments   Labs (all labs ordered are listed, but only abnormal results are displayed) Labs Reviewed - No data to display  EKG None  Radiology DG Chest [facility] 1 View  Result Date: 03/13/2021 CLINICAL DATA:  Cough EXAM: PORTABLE CHEST 1 VIEW COMPARISON:  01/24/2021 FINDINGS: Heart and mediastinal contours are within normal limits. No focal opacities or effusions. No acute bony abnormality. IMPRESSION: No active disease. Electronically Signed   By: Charlett Nose M.D.   On: 03/13/2021 02:59    Procedures Procedures   Medications Ordered in ED Medications - No data to display  ED Course  I have reviewed the triage vital signs and the nursing notes.  Pertinent labs & imaging results that were available during my care of the patient were reviewed by me and considered in  my medical decision making (see chart for details).    MDM Rules/Calculators/A&P                          55 year old male presenting to ER with concern for buttock wound, knee pain.  Patient denies any new falls or new trauma tonight.  Has multiple scattered old abrasions but nothing that appears new or acute.  Basic lab work from yesterday was grossly stable.  Examined patient's buttock wound.  This appears chronic in nature.  He was given a prescription of doxycycline yesterday but did not fill this.  Will provide an additional Rx.  Additionally recommend he have close follow-up with his primary doctor as well as follow-up with wound care center.  Vital  signs are stable.  Believe this can be managed in the outpatient setting.  Will discharge home.    After the discussed management above, the patient was determined to be safe for discharge.  The patient was in agreement with this plan and all questions regarding their care were answered.  ED return precautions were discussed and the patient will return to the ED with any significant worsening of condition.  Final Clinical Impression(s) / ED Diagnoses Final diagnoses:  None    Rx / DC Orders ED Discharge Orders     None        Milagros Loll, MD 03/14/21 [phone]

## 2021-06-04 ENCOUNTER — Inpatient Hospital Stay (HOSPITAL_COMMUNITY): Payer: No Typology Code available for payment source

## 2021-06-04 ENCOUNTER — Inpatient Hospital Stay (HOSPITAL_COMMUNITY)
Admission: EM | Admit: 2021-06-04 | Discharge: 2021-07-05 | DRG: 432 | Disposition: A | Discharge status: Home Health | Payer: No Typology Code available for payment source | Attending: Family Medicine | Admitting: Family Medicine

## 2021-06-04 ENCOUNTER — Emergency Department (HOSPITAL_COMMUNITY): Payer: No Typology Code available for payment source

## 2021-06-04 DIAGNOSIS — K3189 Other diseases of stomach and duodenum: Secondary | ICD-10-CM | POA: Diagnosis present

## 2021-06-04 DIAGNOSIS — D72829 Elevated white blood cell count, unspecified: Secondary | ICD-10-CM | POA: Diagnosis not present

## 2021-06-04 DIAGNOSIS — R7989 Other specified abnormal findings of blood chemistry: Secondary | ICD-10-CM | POA: Diagnosis not present

## 2021-06-04 DIAGNOSIS — G9341 Metabolic encephalopathy: Secondary | ICD-10-CM | POA: Diagnosis present

## 2021-06-04 DIAGNOSIS — Z20822 Contact with and (suspected) exposure to covid-19: Secondary | ICD-10-CM | POA: Diagnosis present

## 2021-06-04 DIAGNOSIS — E43 Unspecified severe protein-calorie malnutrition: Secondary | ICD-10-CM | POA: Diagnosis present

## 2021-06-04 DIAGNOSIS — K2100 Gastro-esophageal reflux disease with esophagitis, without bleeding: Secondary | ICD-10-CM | POA: Diagnosis present

## 2021-06-04 DIAGNOSIS — K7041 Alcoholic hepatic failure with coma: Principal | ICD-10-CM | POA: Diagnosis present

## 2021-06-04 DIAGNOSIS — E876 Hypokalemia: Secondary | ICD-10-CM | POA: Diagnosis present

## 2021-06-04 DIAGNOSIS — D6959 Other secondary thrombocytopenia: Secondary | ICD-10-CM | POA: Diagnosis present

## 2021-06-04 DIAGNOSIS — R17 Unspecified jaundice: Secondary | ICD-10-CM

## 2021-06-04 DIAGNOSIS — K766 Portal hypertension: Secondary | ICD-10-CM | POA: Diagnosis not present

## 2021-06-04 DIAGNOSIS — G629 Polyneuropathy, unspecified: Secondary | ICD-10-CM | POA: Diagnosis present

## 2021-06-04 DIAGNOSIS — K7201 Acute and subacute hepatic failure with coma: Secondary | ICD-10-CM

## 2021-06-04 DIAGNOSIS — K760 Fatty (change of) liver, not elsewhere classified: Secondary | ICD-10-CM | POA: Diagnosis present

## 2021-06-04 DIAGNOSIS — F17200 Nicotine dependence, unspecified, uncomplicated: Secondary | ICD-10-CM | POA: Diagnosis present

## 2021-06-04 DIAGNOSIS — Z6820 Body mass index (BMI) 20.0-20.9, adult: Secondary | ICD-10-CM

## 2021-06-04 DIAGNOSIS — N179 Acute kidney failure, unspecified: Secondary | ICD-10-CM | POA: Diagnosis present

## 2021-06-04 DIAGNOSIS — S00572A Other superficial bite of oral cavity, initial encounter: Secondary | ICD-10-CM | POA: Diagnosis present

## 2021-06-04 DIAGNOSIS — D509 Iron deficiency anemia, unspecified: Secondary | ICD-10-CM | POA: Diagnosis present

## 2021-06-04 DIAGNOSIS — K635 Polyp of colon: Secondary | ICD-10-CM | POA: Diagnosis not present

## 2021-06-04 DIAGNOSIS — K648 Other hemorrhoids: Secondary | ICD-10-CM | POA: Diagnosis present

## 2021-06-04 DIAGNOSIS — K7290 Hepatic failure, unspecified without coma: Secondary | ICD-10-CM | POA: Diagnosis not present

## 2021-06-04 DIAGNOSIS — Z751 Person awaiting admission to adequate facility elsewhere: Secondary | ICD-10-CM

## 2021-06-04 DIAGNOSIS — K767 Hepatorenal syndrome: Secondary | ICD-10-CM | POA: Diagnosis not present

## 2021-06-04 DIAGNOSIS — F1020 Alcohol dependence, uncomplicated: Secondary | ICD-10-CM | POA: Diagnosis present

## 2021-06-04 DIAGNOSIS — I959 Hypotension, unspecified: Secondary | ICD-10-CM | POA: Diagnosis present

## 2021-06-04 DIAGNOSIS — E440 Moderate protein-calorie malnutrition: Secondary | ICD-10-CM | POA: Insufficient documentation

## 2021-06-04 DIAGNOSIS — Z23 Encounter for immunization: Secondary | ICD-10-CM

## 2021-06-04 DIAGNOSIS — K2980 Duodenitis without bleeding: Secondary | ICD-10-CM | POA: Diagnosis present

## 2021-06-04 DIAGNOSIS — E860 Dehydration: Secondary | ICD-10-CM | POA: Diagnosis present

## 2021-06-04 DIAGNOSIS — J9811 Atelectasis: Secondary | ICD-10-CM | POA: Diagnosis present

## 2021-06-04 DIAGNOSIS — K5730 Diverticulosis of large intestine without perforation or abscess without bleeding: Secondary | ICD-10-CM | POA: Diagnosis present

## 2021-06-04 DIAGNOSIS — F1010 Alcohol abuse, uncomplicated: Secondary | ICD-10-CM | POA: Diagnosis not present

## 2021-06-04 DIAGNOSIS — E871 Hypo-osmolality and hyponatremia: Secondary | ICD-10-CM | POA: Diagnosis not present

## 2021-06-04 DIAGNOSIS — K7010 Alcoholic hepatitis without ascites: Secondary | ICD-10-CM | POA: Diagnosis present

## 2021-06-04 DIAGNOSIS — Z79899 Other long term (current) drug therapy: Secondary | ICD-10-CM

## 2021-06-04 DIAGNOSIS — D696 Thrombocytopenia, unspecified: Secondary | ICD-10-CM | POA: Diagnosis present

## 2021-06-04 DIAGNOSIS — K8020 Calculus of gallbladder without cholecystitis without obstruction: Secondary | ICD-10-CM | POA: Diagnosis present

## 2021-06-04 DIAGNOSIS — R296 Repeated falls: Secondary | ICD-10-CM | POA: Diagnosis present

## 2021-06-04 DIAGNOSIS — Z634 Disappearance and death of family member: Secondary | ICD-10-CM

## 2021-06-04 DIAGNOSIS — R5383 Other fatigue: Secondary | ICD-10-CM | POA: Diagnosis present

## 2021-06-04 HISTORY — DX: Acute and subacute hepatic failure with coma: K72.01

## 2021-06-04 LAB — COMPREHENSIVE METABOLIC PANEL
ALT: 75 U/L — ABNORMAL HIGH (ref 0–44)
AST: 156 U/L — ABNORMAL HIGH (ref 15–41)
Albumin: 2.8 g/dL — ABNORMAL LOW (ref 3.5–5.0)
Alkaline Phosphatase: 157 U/L — ABNORMAL HIGH (ref 38–126)
Anion gap: 24 — ABNORMAL HIGH (ref 5–15)
BUN: 129 mg/dL — ABNORMAL HIGH (ref 6–20)
CO2: 25 mmol/L (ref 22–32)
Calcium: 9.2 mg/dL (ref 8.9–10.3)
Chloride: 90 mmol/L — ABNORMAL LOW (ref 98–111)
Creatinine, Ser: 4.48 mg/dL — ABNORMAL HIGH (ref 0.61–1.24)
GFR, Estimated: 15 mL/min — ABNORMAL LOW (ref >60)
Glucose, Bld: 128 mg/dL — ABNORMAL HIGH (ref 70–99)
Potassium: 3.4 mmol/L — ABNORMAL LOW (ref 3.5–5.1)
Sodium: 139 mmol/L (ref 135–145)
Total Bilirubin: 26 mg/dL (ref 0.3–1.2)
Total Protein: 10 g/dL — ABNORMAL HIGH (ref 6.5–8.1)

## 2021-06-04 LAB — HEPATIC FUNCTION PANEL
ALT: 77 U/L — ABNORMAL HIGH (ref 0–44)
AST: 157 U/L — ABNORMAL HIGH (ref 15–41)
Albumin: 2.8 g/dL — ABNORMAL LOW (ref 3.5–5.0)
Alkaline Phosphatase: 168 U/L — ABNORMAL HIGH (ref 38–126)
Bilirubin, Direct: 10 mg/dL — ABNORMAL HIGH (ref 0.0–0.2)
Indirect Bilirubin: 16 mg/dL — ABNORMAL HIGH (ref 0.3–0.9)
Total Bilirubin: 26 mg/dL (ref 0.3–1.2)
Total Protein: 10 g/dL — ABNORMAL HIGH (ref 6.5–8.1)

## 2021-06-04 LAB — RESP PANEL BY RT-PCR (FLU A&B, COVID) ARPGX2
Influenza A by PCR: NEGATIVE
Influenza B by PCR: NEGATIVE
SARS Coronavirus 2 by RT PCR: NEGATIVE

## 2021-06-04 LAB — AMMONIA: Ammonia: 31 umol/L (ref 9–35)

## 2021-06-04 LAB — APTT: aPTT: 35 seconds (ref 24–36)

## 2021-06-04 LAB — CBC WITH DIFFERENTIAL/PLATELET
Abs Immature Granulocytes: 0 10*3/uL (ref 0.00–0.07)
Basophils Absolute: 0 10*3/uL (ref 0.0–0.1)
Basophils Relative: 0 %
Eosinophils Absolute: 0 10*3/uL (ref 0.0–0.5)
Eosinophils Relative: 0 %
HCT: 31.5 % — ABNORMAL LOW (ref 39.0–52.0)
Hemoglobin: 10.3 g/dL — ABNORMAL LOW (ref 13.0–17.0)
Lymphocytes Relative: 18 %
Lymphs Abs: 1.7 10*3/uL (ref 0.7–4.0)
MCH: 32.8 pg (ref 26.0–34.0)
MCHC: 32.7 g/dL (ref 30.0–36.0)
MCV: 100.3 fL — ABNORMAL HIGH (ref 80.0–100.0)
Monocytes Absolute: 0.1 10*3/uL (ref 0.1–1.0)
Monocytes Relative: 1 %
Neutro Abs: 7.6 10*3/uL (ref 1.7–7.7)
Neutrophils Relative %: 81 %
Platelets: 131 10*3/uL — ABNORMAL LOW (ref 150–400)
RBC: 3.14 MIL/uL — ABNORMAL LOW (ref 4.22–5.81)
RDW: 16.6 % — ABNORMAL HIGH (ref 11.5–15.5)
WBC: 9.4 10*3/uL (ref 4.0–10.5)
nRBC: 2.8 % — ABNORMAL HIGH (ref 0.0–0.2)
nRBC: 5 /100 WBC — ABNORMAL HIGH

## 2021-06-04 LAB — PROTIME-INR
INR: 1.1 (ref 0.8–1.2)
Prothrombin Time: 14.2 seconds (ref 11.4–15.2)

## 2021-06-04 LAB — LACTIC ACID, PLASMA
Lactic Acid, Venous: 4.5 mmol/L (ref 0.5–1.9)
Lactic Acid, Venous: 4.3 mmol/L (ref 0.5–1.9)

## 2021-06-04 LAB — ACETAMINOPHEN LEVEL: Acetaminophen (Tylenol), Serum: <10 ug/mL — ABNORMAL LOW (ref 10–30)

## 2021-06-04 MED ORDER — SODIUM CHLORIDE 0.9 % IV SOLN
2.0000 g | Freq: Once | INTRAVENOUS | Status: AC
  Administered 2021-06-04: 2 g via INTRAVENOUS
  Filled 2021-06-04: qty 2

## 2021-06-04 MED ORDER — LACTATED RINGERS IV SOLN: INTRAVENOUS | Status: DC

## 2021-06-04 MED ORDER — SODIUM CHLORIDE 0.9 % IV SOLN
50.0000 ug/h | INTRAVENOUS | Status: DC
  Administered 2021-06-04: 50 ug/h via INTRAVENOUS
  Filled 2021-06-04: qty 1

## 2021-06-04 MED ORDER — LORAZEPAM 1 MG PO TABS: 1.0000 mg | ORAL_TABLET | ORAL | Status: AC | PRN

## 2021-06-04 MED ORDER — THIAMINE HCL 100 MG/ML IJ SOLN
100.0000 mg | Freq: Every day | INTRAMUSCULAR | Status: DC
  Administered 2021-06-04 – 2021-06-07 (×3): 100 mg via INTRAVENOUS
  Filled 2021-06-04 (×6): qty 2

## 2021-06-04 MED ORDER — ALBUMIN HUMAN 25 % IV SOLN
25.0000 g | INTRAVENOUS | Status: DC
  Administered 2021-06-06 – 2021-06-07 (×2): 25 g via INTRAVENOUS
  Filled 2021-06-04 (×2): qty 100

## 2021-06-04 MED ORDER — LACTATED RINGERS IV BOLUS
500.0000 mL | Freq: Once | INTRAVENOUS | Status: AC
  Administered 2021-06-04: 500 mL via INTRAVENOUS

## 2021-06-04 MED ORDER — PANTOPRAZOLE INFUSION (NEW) - SIMPLE MED
8.0000 mg/h | INTRAVENOUS | Status: DC
  Administered 2021-06-04 – 2021-06-06 (×4): 8 mg/h via INTRAVENOUS
  Filled 2021-06-04: qty 100
  Filled 2021-06-04 (×4): qty 80

## 2021-06-04 MED ORDER — SODIUM CHLORIDE 0.9 % IV SOLN
2.0000 g | INTRAVENOUS | Status: DC
  Administered 2021-06-05 – 2021-06-09 (×5): 2 g via INTRAVENOUS
  Filled 2021-06-04 (×5): qty 20

## 2021-06-04 MED ORDER — ADULT MULTIVITAMIN W/MINERALS CH
1.0000 | ORAL_TABLET | Freq: Every day | ORAL | Status: DC
  Administered 2021-06-06 – 2021-07-05 (×29): 1 via ORAL
  Filled 2021-06-04 (×31): qty 1

## 2021-06-04 MED ORDER — SODIUM CHLORIDE 0.9% FLUSH
3.0000 mL | Freq: Two times a day (BID) | INTRAVENOUS | Status: DC
  Administered 2021-06-04 – 2021-06-30 (×29): 3 mL via INTRAVENOUS

## 2021-06-04 MED ORDER — PANTOPRAZOLE 80MG IVPB - SIMPLE MED
80.0000 mg | Freq: Once | INTRAVENOUS | Status: AC
  Administered 2021-06-04: 80 mg via INTRAVENOUS
  Filled 2021-06-04: qty 80

## 2021-06-04 MED ORDER — ONDANSETRON HCL 4 MG/2ML IJ SOLN: 4.0000 mg | Freq: Four times a day (QID) | INTRAMUSCULAR | Status: DC | PRN

## 2021-06-04 MED ORDER — MIDODRINE HCL 5 MG PO TABS
7.5000 mg | ORAL_TABLET | Freq: Three times a day (TID) | ORAL | Status: DC
  Filled 2021-06-04: qty 2

## 2021-06-04 MED ORDER — LACTATED RINGERS IV BOLUS (SEPSIS)
1000.0000 mL | Freq: Once | INTRAVENOUS | Status: AC
  Administered 2021-06-04: 1000 mL via INTRAVENOUS

## 2021-06-04 MED ORDER — LACTATED RINGERS IV BOLUS (SEPSIS)
250.0000 mL | Freq: Once | INTRAVENOUS | Status: AC
  Administered 2021-06-04: 250 mL via INTRAVENOUS

## 2021-06-04 MED ORDER — SODIUM CHLORIDE 0.9 % IV SOLN: 2.0000 g | INTRAVENOUS | Status: DC

## 2021-06-04 MED ORDER — OCTREOTIDE LOAD VIA INFUSION
50.0000 ug | Freq: Once | INTRAVENOUS | Status: AC
  Administered 2021-06-04: 50 ug via INTRAVENOUS
  Filled 2021-06-04: qty 25

## 2021-06-04 MED ORDER — FOLIC ACID 1 MG PO TABS
1.0000 mg | ORAL_TABLET | Freq: Every day | ORAL | Status: DC
  Filled 2021-06-04: qty 1

## 2021-06-04 MED ORDER — THIAMINE HCL 100 MG PO TABS
100.0000 mg | ORAL_TABLET | Freq: Every day | ORAL | Status: DC
  Administered 2021-06-06 – 2021-07-05 (×29): 100 mg via ORAL
  Filled 2021-06-04 (×31): qty 1

## 2021-06-04 MED ORDER — ONDANSETRON HCL 4 MG PO TABS: 4.0000 mg | ORAL_TABLET | Freq: Four times a day (QID) | ORAL | Status: DC | PRN

## 2021-06-04 MED ORDER — LORAZEPAM 2 MG/ML IJ SOLN: 1.0000 mg | INTRAMUSCULAR | Status: AC | PRN

## 2021-06-04 MED ORDER — ALBUMIN HUMAN 25 % IV SOLN
50.0000 g | INTRAVENOUS | Status: AC
  Administered 2021-06-04: 12.5 g via INTRAVENOUS
  Administered 2021-06-04: 37.5 g via INTRAVENOUS
  Filled 2021-06-04: qty 200

## 2021-06-04 MED ORDER — SODIUM CHLORIDE 0.9 % IV SOLN
1.0000 mg | Freq: Once | INTRAVENOUS | Status: AC
  Administered 2021-06-05: 1 mg via INTRAVENOUS
  Filled 2021-06-04: qty 0.2

## 2021-06-04 MED ORDER — LACTATED RINGERS IV SOLN: INTRAVENOUS | Status: AC

## 2021-06-04 MED ORDER — SODIUM CHLORIDE 0.9 % IV SOLN
50.0000 ug/h | INTRAVENOUS | Status: DC
  Administered 2021-06-05 – 2021-06-06 (×3): 50 ug/h via INTRAVENOUS
  Filled 2021-06-04 (×3): qty 1

## 2021-06-04 NOTE — ED Provider Notes (Signed)
[facility] Provider [facility]   CSN: [id number] Arrival date & time: 06/04/21  1526     History Chief Complaint  Patient presents with   Code Sepsis    Dylan Weaver is a 55 y.o. male.  Presents to ER as code sepsis.  Per EMS report family states they had not heard from patient for over a week.  Patient provides very little history.  Does endorse general fatigue, poor appetite.  Denies nausea or vomiting.  No pain at present.  Additional history obtained from family at bedside.  She last saw patient about a week ago and when they checked on him today he appeared much worse.  Yellowing of skin is new.  Has known history of alcohol abuse.  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   Thrombocytopenia (HCC) 09/14/2020   Normochromic normocytic anemia 09/14/2020   Alcohol use disorder, severe, dependence (HCC) 06/19/2020   Alcohol abuse with intoxication (HCC) 06/19/2020    Past Surgical History:  Procedure Laterality Date   NASAL SEPTUM SURGERY     WRIST SURGERY         Family History  Family history unknown: Yes    Social History   Tobacco Use   Smoking status: Every Day   Smokeless tobacco: Never  Vaping Use   Vaping Use: Never used  Substance Use Topics   Alcohol use: Yes    Comment: 12 pack a day beer   Drug use: Never    Home Medications Prior to Admission medications   Medication Sig Start Date End Date Taking? Authorizing Provider  doxycycline (VIBRAMYCIN) 100 MG capsule Take 1 capsule (100 mg total) by mouth 2 (two) times daily. 03/14/21   Milagros Lollykstra, Felix Pratt S, MD  folic  acid (FOLVITE) 1 MG tablet Take 1 tablet (1 mg total) by mouth daily. 12/04/20   Barnetta Chapelgbata, Sylvester I, MD  ketoconazole (NIZORAL) 2 % cream Apply topically 2 (two) times daily. Patient not taking: No sig reported 12/04/20   Barnetta Chapelgbata, Sylvester I, MD  liver oil-zinc oxide (DESITIN) 40 % ointment Apply topically 2 (two) times daily. Patient not taking: No sig reported 12/04/20   Barnetta Chapelgbata, Sylvester I, MD  Multiple Vitamin (MULTIVITAMIN WITH MINERALS) TABS tablet Take 1 tablet by mouth daily before breakfast. Patient not taking: No sig reported 12/04/20   Berton Mountgbata, Sylvester I, MD  Nystatin (GERHARDT'S BUTT CREAM) CREA Apply 1 application topically 2 (two) times daily. Patient not taking: No sig reported 12/04/20   Barnetta Chapelgbata, Sylvester I, MD    Allergies    Patient has no known allergies.  Review of Systems   Review of Systems  Unable to perform ROS: Mental status change   Physical Exam Updated Vital Signs BP 104/77   Pulse 92   Temp 97.6 F (36.4 C) (Axillary)   Resp 18   Wt 81.6 kg   SpO2 100%   BMI 24.40 kg/m   Physical Exam Vitals and nursing note reviewed.  Constitutional:      Appearance: He is well-developed.     Comments: Chronically ill-appearing, disheveled, yellow skin  HENT:  Head: Normocephalic.     Mouth/Throat:     Comments: Dry mucous membranes, dried dark material on tongue, roof of mouth Eyes:     General: Scleral icterus present.  Cardiovascular:     Rate and Rhythm: Regular rhythm. Tachycardia present.     Heart sounds: No murmur heard. Pulmonary:     Effort: Pulmonary effort is normal. No respiratory distress.     Breath sounds: Normal breath sounds.  Abdominal:     Palpations: Abdomen is soft.     Tenderness: There is no abdominal tenderness.  Musculoskeletal:        General: No deformity or signs of injury.     Cervical back: Neck supple.  Skin:    General: Skin is warm and dry.     Coloration: Skin is jaundiced.  Neurological:     Mental Status: He is  alert.     Comments: Alert but somewhat confused, answers only very basic questions, follows commands, opens eyes spontaneously    ED Results / Procedures / Treatments   Labs (all labs ordered are listed, but only abnormal results are displayed) Labs Reviewed  LACTIC ACID, PLASMA - Abnormal; Notable for the following components:      Result Value   Lactic Acid, Venous 4.5 (*)    All other components within normal limits  COMPREHENSIVE METABOLIC PANEL - Abnormal; Notable for the following components:   Potassium 3.4 (*)    Chloride 90 (*)    Glucose, Bld 128 (*)    BUN 129 (*)    Creatinine, Ser 4.48 (*)    Total Protein 10.0 (*)    Albumin 2.8 (*)    AST 156 (*)    ALT 75 (*)    Alkaline Phosphatase 157 (*)    Total Bilirubin 26.0 (*)    GFR, Estimated 15 (*)    Anion gap 24 (*)    All other components within normal limits  CBC WITH DIFFERENTIAL/PLATELET - Abnormal; Notable for the following components:   RBC 3.14 (*)    Hemoglobin 10.3 (*)    HCT 31.5 (*)    MCV 100.3 (*)    RDW 16.6 (*)    Platelets 131 (*)    nRBC 2.8 (*)    nRBC 5 (*)    All other components within normal limits  RESP PANEL BY RT-PCR (FLU A&B, COVID) [id number]  CULTURE, BLOOD (ROUTINE X 2)  CULTURE, BLOOD (ROUTINE X 2)  URINE CULTURE  PROTIME-INR  APTT  AMMONIA  LACTIC ACID, PLASMA  URINALYSIS, ROUTINE W REFLEX MICROSCOPIC  HEPATIC FUNCTION PANEL  HEPATITIS PANEL, ACUTE  ACETAMINOPHEN LEVEL    EKG EKG Interpretation  Date/Time:  Saturday June 04 2021 15:56:17 EDT Ventricular Rate:  100 PR Interval:  166 QRS Duration: 84 QT Interval:  384 QTC Calculation: 496 R Axis:   70 Text Interpretation: Sinus tachycardia Borderline low voltage, extremity leads Abnormal T, consider ischemia, diffuse leads Confirmed by Marianna Fuss ([id number]) on 06/04/2021 4:49:39 PM  Radiology DG Chest Port 1 View  Result Date: 06/04/2021 CLINICAL DATA:  Questionable sepsis, EXAM: PORTABLE CHEST 1 VIEW  COMPARISON:  March 13, 2021 FINDINGS: The heart size and mediastinal contours are within normal limits. Linear opacity in the left lung base, favor atelectasis. No pleural effusion. No pneumothorax. No acute osseous abnormality. Remote bilateral clavicle fractures. Remote left-sided rib fractures. IMPRESSION: Linear opacity in the left lung base, favor atelectasis. Electronically Signed   By: Maudry Mayhew M.D.   On: 06/04/2021  15:56   US Abdomen Limited RUQ (LIVER/GB)  Result Date: 06/04/2021 CLINICAL DATA:  Jaundice EXAM: ULTRASOUND ABDOMEN LIMITED RIGHT UPPER QUADRANT COMPARISON:  Abdominal ultrasound 11/29/2020 FINDINGS: Gallbladder: There are small gallstones. No gallbladder wall thickening. No sonographic Murphy sign noted by sonographer. Common bile duct: Diameter: 0.4 cm, within normal limits Liver: No focal lesion identified. Parenchymal echogenicity is diffusely increased. Portal vein is patent on color Doppler imaging with normal direction of blood flow towards the liver. Other: None. IMPRESSION: 1.  No acute finding sonographically in the abdomen. 2.  Cholelithiasis without evidence of acute cholecystitis. 3. Diffusely increased liver parenchymal echogenicity which is nonspecific but most commonly seen with hepatic steatosis. Electronically Signed   By: Emmaline Kluver M.D.   On: 06/04/2021 18:15    Procedures .Critical Care  Date/Time: 06/04/2021 7:03 PM Performed by: Milagros Loll, MD Authorized by: Milagros Loll, MD   Critical care provider statement:    Critical care time (minutes):  43   Critical care was time spent personally by me on the following activities:  Discussions with consultants, evaluation of patient's response to treatment, examination of patient, ordering and performing treatments and interventions, ordering and review of laboratory studies, ordering and review of radiographic studies, pulse oximetry, re-evaluation of patient's condition, obtaining history from  patient or surrogate and review of old charts   Medications Ordered in ED Medications  lactated ringers infusion ( Intravenous New Bag/Given 06/04/21 1539)  lactated ringers bolus 1,000 mL (0 mLs Intravenous Stopped 06/04/21 1640)    And  lactated ringers bolus 1,000 mL (0 mLs Intravenous Stopped 06/04/21 1829)    And  lactated ringers bolus 250 mL (has no administration in time range)  pantoprozole (PROTONIX) 80 mg /NS 100 mL infusion (8 mg/hr Intravenous New Bag/Given 06/04/21 1859)  ceFEPIme (MAXIPIME) 2 g in sodium chloride 0.9 % 100 mL IVPB (has no administration in time range)  ceFEPIme (MAXIPIME) 2 g in sodium chloride 0.9 % 100 mL IVPB (0 g Intravenous Stopped 06/04/21 1640)  pantoprazole (PROTONIX) 80 mg /NS 100 mL IVPB (0 mg Intravenous Stopped 06/04/21 1656)  octreotide (SANDOSTATIN) 2 mcg/mL load via infusion 50 mcg (50 mcg Intravenous Bolus from Bag 06/04/21 1706)    ED Course  I have reviewed the triage vital signs and the nursing notes.  Pertinent labs & imaging results that were available during my care of the patient were reviewed by me and considered in my medical decision making (see chart for details).  Clinical Course as of 06/04/21 1903  Sat Jun 04, 2021  1812 D/w Christella Hartigan - his team will see as consult, agress with RUQ [place], further recs to follow, likely liver failure from alcohol abuse [RD]    Clinical Course User Index [RD] Milagros Loll, MD   MDM Rules/Calculators/A&P                          55 year old male presented to ER by EMS as code sepsis.  Patient has a history of alcohol abuse, found to be lethargic, tachycardic and hypotensive.  On arrival to ER patient mildly tachycardic but BP initially hypotensive.  Patient also noted to have dark dried material in mouth raising concern for possible upper GI bleed. Initiated sepsis labs, fluids, abx as well as octreotide and ppi gtt. Labs noted for profound elevation in bilirubin and mild LFT elevation. Hgb with mild drop  and patient had no episodes of vomiting while in ER so  lower concern for active GI bleed at present, dc octreotide.  Lactate elevated but no leukocytosis, no clear source for infection, therefore sepsis less likely but still on differential.  RUQ neg for obstructing pathology. D/w Christella Hartigan for [facility] -their team will evaluate as consult.  He feels this likely is alcohol related liver failure.  Agreed with right upper quadrant ultrasound to rule out obstructive pathology for now.  Recommend medicine admission.  Further recommendations pending her evaluation likely tomorrow morning.  Patient also noted to have renal failure.  Will consult medicine unassigned for admit.    Final Clinical Impression(s) / ED Diagnoses Final diagnoses:  Jaundice  Elevated lactic acid level  Serum total bilirubin elevated  Liver failure without hepatic coma, unspecified chronicity (HCC)  Acute renal failure, unspecified acute renal failure type (HCC)  Alcohol abuse    Rx / DC Orders ED Discharge Orders     None        Milagros Loll, MD 06/04/21 [phone]

## 2021-06-04 NOTE — ED Notes (Signed)
26.0 bilirubin  4.5 lactic acid  MD notified

## 2021-06-04 NOTE — Sepsis Progress Note (Signed)
Sepsis protocol is being monitored by eLink. 

## 2021-06-04 NOTE — H&P (Signed)
History and Physical    Dylan Weaver UKG:[id number] DOB: 12-14-1965 DOA: 06/04/2021  PCP: [facility]  Patient coming from: Home via EMS  I have personally briefly reviewed patient's old medical records in [place]  Chief Complaint: Jaundice  HPI: Dylan Weaver is a 55 y.o. male with medical history significant for severe alcohol abuse who presented to the ED via EMS for evaluation of weakness and jaundice.  History is limited from patient due to encephalopathy and is otherwise supplemented by EDP and chart review.  Patient was last seen by family about a week ago.  He lives alone.  I did not hear from him and went to check in on him today.  He appeared newly jaundiced and ill.  EMS were called and he was brought to the ED for further evaluation.  Patient states that he has just been feeling sluggish.  He denies any vomiting.  When asked if he bit his tongue he says yes.  He is otherwise not answering further questions to assess for last alcohol use, history of seizures, pain, etc.  ED Course:  Initial vitals showed BP 72/62, pulse 105, RR 21, temp 97.6 F, SPO2 97% on room air.  Labs significant for total bilirubin 26.0, AST 156, ALT 75, alk phos 157, BUN 129, creatinine 4.48 (previously 0.70 on 03/13/2021), sodium 139, potassium 3.4, bicarb 25, serum glucose 128, lactic acid 4.5, WBC 9.4, hemoglobin 10.3, platelets 131,000, INR 1.1, ammonia 31.  Acute hepatitis panel, acetaminophen levels obtained and pending.  Blood cultures collected and pending.  SARS-CoV-2 PCR panel collected and pending.  Portable chest x-ray shows linear opacity in the left lung base favoring atelectasis and remote left-sided rib fractures and bilateral clavicle fractures.  RUQ ultrasound was negative for acute findings.  Cholelithiasis without evidence of acute cholecystitis noted.  Nonspecific increased liver parenchymal echogenicity seen.  Patient was given 2.25 L LR  followed by maintenance fluids.  He was also given IV Protonix 80 mg bolus followed by continuous infusion, IV octreotide 50 mcg, IV cefepime.  EDP discussed with on-call GI who felt this was acute liver failure related to alcohol use.  They will consult patient.  The hospitalist service was consulted to admit for further evaluation and management.  Review of Systems:  Unable to obtain full review of systems due to encephalopathy.  Past Medical History:  Diagnosis Date   ETOH abuse     Past Surgical History:  Procedure Laterality Date   NASAL SEPTUM SURGERY     WRIST SURGERY      Social History:  reports that he has been smoking. He has never used smokeless tobacco. He reports current alcohol use. He reports that he does not use drugs.  No Known Allergies  Family History  Family history unknown: Yes     Prior to Admission medications   Medication Sig Start Date End Date Taking? Authorizing Provider  doxycycline (VIBRAMYCIN) 100 MG capsule Take 1 capsule (100 mg total) by mouth 2 (two) times daily. 03/14/21   Lucrezia Starch, MD  folic acid (FOLVITE) 1 MG tablet Take 1 tablet (1 mg total) by mouth daily. 12/04/20   Bonnell Public, MD  ketoconazole (NIZORAL) 2 % cream Apply topically 2 (two) times daily. Patient not taking: No sig reported 12/04/20   Bonnell Public, MD  liver oil-zinc oxide (DESITIN) 40 % ointment Apply topically 2 (two) times daily. Patient not taking: No sig reported 12/04/20   Bonnell Public,  MD  Multiple Vitamin (MULTIVITAMIN WITH MINERALS) TABS tablet Take 1 tablet by mouth daily before breakfast. Patient not taking: No sig reported 12/04/20   Dana Allan I, MD  Nystatin (GERHARDT'S BUTT CREAM) CREA Apply 1 application topically 2 (two) times daily. Patient not taking: No sig reported 12/04/20   Bonnell Public, MD    Physical Exam: Vitals:   06/04/21 1700 06/04/21 1715 06/04/21 1845 06/04/21 1945  BP: (!) 106/55 104/77 102/68 92/63   Pulse: 93 92 94 93  Resp: _0 Temp:      TempSrc:      SpO2: 100% 100% 98% 97%  Weight:       Constitutional: Ill-appearing severely jaundiced thin disheveled man resting in bed.  NAD, calm, somnolent.  Briefly answering questions. Eyes: PERRL, lids and conjunctivae normal ENMT: Mucous membranes are dry.  Appears to have a laceration to distal tongue.  Dried dark red and black appearing blood at the tongue and mucous membranes/lips. Neck: normal, supple, no masses. Respiratory: clear to auscultation anteriorly. Cardiovascular: Regular rate and rhythm, no murmurs / rubs / gallops. No extremity edema. Abdomen: no apparent tenderness, abdomen is not distended or tense.  Bowel sounds diminished. Musculoskeletal: no clubbing / cyanosis. No joint deformity upper and lower extremities. Good ROM, no contractures. Normal muscle tone.  Skin: Jaundiced appearance, dried dark red/black blood mucous membranes and lips Neurologic: CN 2-12 grossly intact. Sensation appears intact, moving extremities equally. Psychiatric: Somnolent, briefly awakens.  Oriented to self but not to place, time, situation.  Labs on Admission: I have personally reviewed following labs and imaging studies  CBC: Recent Labs  Lab 06/04/21 1535  WBC 9.4  NEUTROABS 7.6  HGB 10.3*  HCT 31.5*  MCV 100.3*  PLT 831*   Basic Metabolic Panel: Recent Labs  Lab 06/04/21 1535  NA 139  K 3.4*  CL 90*  CO2 25  GLUCOSE 128*  BUN 129*  CREATININE 4.48*  CALCIUM 9.2   GFR: Estimated Creatinine Clearance: 20.4 mL/min (A) (by C-G formula based on SCr of 4.48 mg/dL (H)). Liver Function Tests: Recent Labs  Lab 06/04/21 1535  AST 157*  156*  ALT 77*  75*  ALKPHOS 168*  157*  BILITOT 26.0*  26.0*  PROT 10.0*  10.0*  ALBUMIN 2.8*  2.8*   No results for input(s): LIPASE, AMYLASE in the last 168 hours. Recent Labs  Lab 06/04/21 1537  AMMONIA 31   Coagulation Profile: Recent Labs  Lab 06/04/21 1535   INR 1.1   Cardiac Enzymes: No results for input(s): CKTOTAL, CKMB, CKMBINDEX, TROPONINI in the last 168 hours. BNP (last 3 results) No results for input(s): PROBNP in the last 8760 hours. HbA1C: No results for input(s): HGBA1C in the last 72 hours. CBG: No results for input(s): GLUCAP in the last 168 hours. Lipid Profile: No results for input(s): CHOL, HDL, LDLCALC, TRIG, CHOLHDL, LDLDIRECT in the last 72 hours. Thyroid Function Tests: No results for input(s): TSH, T4TOTAL, FREET4, T3FREE, THYROIDAB in the last 72 hours. Anemia Panel: No results for input(s): VITAMINB12, FOLATE, FERRITIN, TIBC, IRON, RETICCTPCT in the last 72 hours. Urine analysis:    Component Value Date/Time   COLORURINE YELLOW 01/24/2021 1601   APPEARANCEUR CLEAR 01/24/2021 1601   LABSPEC 1.009 01/24/2021 1601   PHURINE 6.0 01/24/2021 1601   GLUCOSEU NEGATIVE 01/24/2021 1601   HGBUR NEGATIVE 01/24/2021 1601   BILIRUBINUR NEGATIVE 01/24/2021 1601   KETONESUR NEGATIVE 01/24/2021 1601   PROTEINUR NEGATIVE 01/24/2021 1601  NITRITE NEGATIVE 01/24/2021 1601   LEUKOCYTESUR NEGATIVE 01/24/2021 1601    Radiological Exams on Admission: DG Chest Port 1 View  Result Date: 06/04/2021 CLINICAL DATA:  Questionable sepsis, EXAM: PORTABLE CHEST 1 VIEW COMPARISON:  March 13, 2021 FINDINGS: The heart size and mediastinal contours are within normal limits. Linear opacity in the left lung base, favor atelectasis. No pleural effusion. No pneumothorax. No acute osseous abnormality. Remote bilateral clavicle fractures. Remote left-sided rib fractures. IMPRESSION: Linear opacity in the left lung base, favor atelectasis. Electronically Signed   By: Dahlia Bailiff M.D.   On: 06/04/2021 15:56   US Abdomen Limited RUQ (LIVER/GB)  Result Date: 06/04/2021 CLINICAL DATA:  Jaundice EXAM: ULTRASOUND ABDOMEN LIMITED RIGHT UPPER QUADRANT COMPARISON:  Abdominal ultrasound 11/29/2020 FINDINGS: Gallbladder: There are small gallstones. No  gallbladder wall thickening. No sonographic Murphy sign noted by sonographer. Common bile duct: Diameter: 0.4 cm, within normal limits Liver: No focal lesion identified. Parenchymal echogenicity is diffusely increased. Portal vein is patent on color Doppler imaging with normal direction of blood flow towards the liver. Other: None. IMPRESSION: 1.  No acute finding sonographically in the abdomen. 2.  Cholelithiasis without evidence of acute cholecystitis. 3. Diffusely increased liver parenchymal echogenicity which is nonspecific but most commonly seen with hepatic steatosis. Electronically Signed   By: Audie Pinto M.D.   On: 06/04/2021 18:15    EKG: Personally reviewed. Sinus rhythm, T wave inversions V3-V6.  Rate is slower when compared to prior.  Assessment/Plan Principal Problem:   Acute liver failure with hepatic coma (HCC) Active Problems:   Alcohol use disorder, severe, dependence (HCC)   Thrombocytopenia (HCC)   Elevated LFTs   Acute renal failure (ARF) ([address])   Dylan Weaver is a 55 y.o. male with medical history significant for severe alcohol abuse who is admitted with acute liver failure.  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  Patient severely jaundiced and encephalopathic on admission.  He was hypotensive and tachycardic on arrival with question of upper GI bleed therefore started on aggressive therapy with IV fluid resuscitation, antibiotics, PPI, octreotide.  RUQ ultrasound without acute findings.  Cholelithiasis without acute cholecystitis and hepatic steatosis changes noted. -GI consulted and to see -Continue IV Protonix infusion -Continue IV octreotide infusion -Continue IV ceftriaxone 2 g daily -Continue IV fluid hydration with LR_0  mL/hour overnight -Follow liver function, acute hepatitis panel -Admit to stepdown unit  Acute renal failure: Last creatinine 0.70 on 03/13/2021.  BUN 129 with  creatinine 4.48 on admission.  Concern for possible hepatorenal syndrome. -Check renal ultrasound -Continue IV fluid hydration as above -Continue IV octreotide as above -Add midodrine 7.5 mg 3 times daily PO as tolerated -Start IV albumin -Monitor strict I/O's -Repeat labs in a.m.  ?  Upper GI bleed: Appearance of dried blood on lips and oral mucosa.  Looks to have bitten his tongue.  Continue IV Protonix, octreotide, ceftriaxone as above.  Alcohol use disorder: High risk for severe withdrawal.  Time of last drink unknown.  Started on CIWA protocol with IV Ativan as needed.  Thrombocytopenia: Mild, monitor closely.  DVT prophylaxis: SCDs Code Status: Full code Family Communication: None available on admission. Disposition Plan: From home, dispo pending clinical progress Consults called: Gastroenterology Level of care: Progressive Admission status:  Status is: Inpatient  Remains inpatient appropriate because:Hemodynamically unstable, Altered mental status, Unsafe d/c plan, IV treatments appropriate due to intensity of illness or inability to take PO, and Inpatient level of care  appropriate due to severity of illness  Dispo: The patient is from: Home              Anticipated d/c is to:  Pending clinical progress              Patient currently is not medically stable to d/c.  Zada Finders MD [facility]  If 7PM-7AM, please contact night-coverage [email or web address]  06/04/2021, 8:31 PM

## 2021-06-04 NOTE — ED Notes (Signed)
Attempted report 

## 2021-06-04 NOTE — Progress Notes (Signed)
Patient arrived from the ER on a stretcher, assessment completed see flow sheet, several maggots noted on patient's back with scattered skin tear on the back and buttocks,a small ulcer also noted on the right side of the back.  CHG bath given, dry old blood noted in the mouth oral care provided. Placed on monitor ccmd notified, patient oriented to room and staff , bed in lowest position, call bell within reach will continue to monitor.

## 2021-06-04 NOTE — Progress Notes (Signed)
Pharmacy Antibiotic Note  Kyshaun Barnette is a 55 y.o. male admitted on 06/04/2021 with sepsis.  Pharmacy has been consulted for Cefepime dosing.  Plan: Cefepime 2g IV q24h  given renal dysfunction -Monitor renal function, clinical status, and antibiotic plan  Weight: 81.6 kg (179 lb 14.3 oz)  No data recorded.  Recent Labs  Lab 06/04/21 1535  WBC 9.4  CREATININE 4.48*  LATICACIDVEN 4.5*    Estimated Creatinine Clearance: 20.4 mL/min (A) (by C-G formula based on SCr of 4.48 mg/dL (H)).    No Known Allergies  Antimicrobials this admission: Cefepime 9/3 >>   Microbiology results: 9/3 BCx:  9/3 UCx:   Thank you for allowing pharmacy to be a part of this patient's care.  Loleta Dicker, PharmD, [facility] Emergency Medicine Clinical Pharmacist ED RPh Phone: [phone] Main RX: [phone]

## 2021-06-04 NOTE — ED Triage Notes (Signed)
Pt bib gems from home as a code sepsis. Per ems, the family called the ems bc they hasn't heard from the pt for a week. Pt here 2 weeks ago for sepsis.   Meds with ems:  -800 ml saline   Vitals with ems:   -bp 100/60 -hr130 -cbg 132

## 2021-06-05 DIAGNOSIS — K7201 Acute and subacute hepatic failure with coma: Secondary | ICD-10-CM | POA: Diagnosis not present

## 2021-06-05 LAB — CBC
HCT: 16.4 % — ABNORMAL LOW (ref 39.0–52.0)
Hemoglobin: 5.6 g/dL — CL (ref 13.0–17.0)
MCH: 33.7 pg (ref 26.0–34.0)
MCHC: 34.1 g/dL (ref 30.0–36.0)
MCV: 98.8 fL (ref 80.0–100.0)
Platelets: 77 10*3/uL — ABNORMAL LOW (ref 150–400)
RBC: 1.66 MIL/uL — ABNORMAL LOW (ref 4.22–5.81)
RDW: 16.4 % — ABNORMAL HIGH (ref 11.5–15.5)
WBC: 7.4 10*3/uL (ref 4.0–10.5)
nRBC: 1.1 % — ABNORMAL HIGH (ref 0.0–0.2)

## 2021-06-05 LAB — BASIC METABOLIC PANEL
Anion gap: 20 — ABNORMAL HIGH (ref 5–15)
BUN: 121 mg/dL — ABNORMAL HIGH (ref 6–20)
CO2: 23 mmol/L (ref 22–32)
Calcium: 8.2 mg/dL — ABNORMAL LOW (ref 8.9–10.3)
Chloride: 98 mmol/L (ref 98–111)
Creatinine, Ser: 3.69 mg/dL — ABNORMAL HIGH (ref 0.61–1.24)
GFR, Estimated: 19 mL/min — ABNORMAL LOW (ref >60)
Glucose, Bld: 117 mg/dL — ABNORMAL HIGH (ref 70–99)
Potassium: 3 mmol/L — ABNORMAL LOW (ref 3.5–5.1)
Sodium: 141 mmol/L (ref 135–145)

## 2021-06-05 LAB — ETHANOL: Alcohol, Ethyl (B): <10 mg/dL (ref <10)

## 2021-06-05 LAB — HEPATIC FUNCTION PANEL
ALT: 42 U/L (ref 0–44)
AST: 85 U/L — ABNORMAL HIGH (ref 15–41)
Albumin: 2.8 g/dL — ABNORMAL LOW (ref 3.5–5.0)
Alkaline Phosphatase: 85 U/L (ref 38–126)
Bilirubin, Direct: 9.7 mg/dL — ABNORMAL HIGH (ref 0.0–0.2)
Indirect Bilirubin: 7.6 mg/dL — ABNORMAL HIGH (ref 0.3–0.9)
Total Bilirubin: 17.3 mg/dL — ABNORMAL HIGH (ref 0.3–1.2)
Total Protein: 6.8 g/dL (ref 6.5–8.1)

## 2021-06-05 LAB — MAGNESIUM: Magnesium: 2.4 mg/dL (ref 1.7–2.4)

## 2021-06-05 LAB — PREPARE RBC (CROSSMATCH)

## 2021-06-05 LAB — RETICULOCYTES
Immature Retic Fract: 8.9 % (ref 2.3–15.9)
RBC.: 2.09 MIL/uL — ABNORMAL LOW (ref 4.22–5.81)
Retic Count, Absolute: 66.5 10*3/uL (ref 19.0–186.0)
Retic Ct Pct: 3.2 % — ABNORMAL HIGH (ref 0.4–3.1)

## 2021-06-05 LAB — PROTIME-INR
INR: 1.3 — ABNORMAL HIGH (ref 0.8–1.2)
Prothrombin Time: 15.8 seconds — ABNORMAL HIGH (ref 11.4–15.2)

## 2021-06-05 LAB — HEPATITIS PANEL, ACUTE
HCV Ab: NONREACTIVE
Hep A IgM: NONREACTIVE
Hep B C IgM: NONREACTIVE
Hepatitis B Surface Ag: NONREACTIVE

## 2021-06-05 LAB — PHOSPHORUS: Phosphorus: 4.9 mg/dL — ABNORMAL HIGH (ref 2.5–4.6)

## 2021-06-05 LAB — PROCALCITONIN: Procalcitonin: 6.51 ng/mL

## 2021-06-05 LAB — ABO/RH: ABO/RH(D): A NEG

## 2021-06-05 MED ORDER — FOLIC ACID 1 MG PO TABS
1.0000 mg | ORAL_TABLET | Freq: Every day | ORAL | Status: DC
  Administered 2021-06-06 – 2021-07-05 (×29): 1 mg via ORAL
  Filled 2021-06-05 (×29): qty 1

## 2021-06-05 MED ORDER — MIDODRINE HCL 5 MG PO TABS
10.0000 mg | ORAL_TABLET | Freq: Three times a day (TID) | ORAL | Status: DC
  Administered 2021-06-05 – 2021-06-08 (×9): 10 mg via ORAL
  Filled 2021-06-05 (×9): qty 2

## 2021-06-05 MED ORDER — POTASSIUM CHLORIDE CRYS ER 20 MEQ PO TBCR
40.0000 meq | EXTENDED_RELEASE_TABLET | ORAL | Status: AC
  Administered 2021-06-05 (×2): 40 meq via ORAL
  Filled 2021-06-05 (×2): qty 2

## 2021-06-05 MED ORDER — SODIUM CHLORIDE 0.9% IV SOLUTION: Freq: Once | INTRAVENOUS | Status: DC

## 2021-06-05 MED ORDER — LACTATED RINGERS IV SOLN: INTRAVENOUS | Status: DC

## 2021-06-05 MED ORDER — ALBUMIN HUMAN 25 % IV SOLN
50.0000 g | INTRAVENOUS | Status: AC
  Administered 2021-06-05: 50 g via INTRAVENOUS
  Filled 2021-06-05: qty 200

## 2021-06-05 NOTE — Progress Notes (Signed)
Pts. Systolic has consistently been 62-67, MAP of 66-68 this morning. MD Notified and orders to renew LR and increase Midodrine.

## 2021-06-05 NOTE — Progress Notes (Signed)
PROGRESS NOTE    Dylan Weaver  UKG:[id number] DOB: [birth year] DOA: 06/04/2021 PCP: [facility]    Brief Narrative:  Dylan Weaver is a 55 y.o. 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  History is limited from patient due to encephalopathy and is otherwise supplemented by EDP and chart review. Patient was last seen by family about a week ago.  He lives alone.  I did not hear from him and went to check in on him today.  He appeared newly jaundiced and ill.  EMS were called and he was brought to the ED for further evaluation.  Patient states that he has just been feeling sluggish.  He denies any vomiting.  When asked if he bit his tongue he says yes.  He is otherwise not answering further questions to assess for last alcohol use, history of seizures, pain, etc.   In the ED, BP 72/62, pulse 105, RR 21, temp 97.6 F, SPO2 97% on room air. Total bilirubin 26.0, AST 156, ALT 75, alk phos 157, BUN 129, creatinine 4.48 (previously 0.70 on 03/13/2021), sodium 139, potassium 3.4, bicarb 25, serum glucose 128, lactic acid 4.5, WBC 9.4, hemoglobin 10.3, platelets 131,000, INR 1.1, ammonia 31.  Acute hepatitis panel, acetaminophen levels obtained and pending.  Blood cultures collected and pending.  SARS-CoV-2 PCR panel collected and pending.   Portable chest x-ray shows linear opacity in the left lung base favoring atelectasis and remote left-sided rib fractures and bilateral clavicle fractures.  RUQ ultrasound was negative for acute findings.  Cholelithiasis without evidence of acute cholecystitis noted.  Nonspecific increased liver parenchymal echogenicity seen.   Patient was given 2.25 L LR followed by maintenance fluids.  He was also given IV Protonix 80 mg bolus followed by continuous infusion, IV octreotide 50 mcg, IV  cefepime.  EDP discussed with on-call GI who felt this was acute liver failure related to alcohol use and will see in consulation.  Hospitalist service was consulted for admission to further evaluate and manage acute liver failure in the setting of severe EtOH abuse, hypotension, hyperbilirubinemia, and acute renal failure.   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  Patient severely jaundiced and encephalopathic on admission.  He was hypotensive and tachycardic on arrival with question of upper GI bleed therefore started on aggressive therapy with IV fluid resuscitation, antibiotics, PPI, octreotide.  RUQ ultrasound without acute findings.  Cholelithiasis without acute cholecystitis and hepatic steatosis changes noted.  Acute hepatitis panel negative. --[facility] following; appreciate assistance --MELD-Na on admission>>33; correlating to 65-66% 90-day mortality --MDF on admission = 7.6; would not benefit from steroids --AST 156>85 --ALT 75>42 --Tbili 26>17.3 --Continue Protonix/octreotide drip --Ceftriaxone 2 g IV daily x 7 days --LR @ 100 mL/hour --continue Albumin IV --Continue to monitor LFTs, PT/INR daily   Acute renal failure: Last creatinine 0.70 on 03/13/2021.  BUN 129 with creatinine 4.48 on admission.  Concern for possible hepatorenal syndrome.  Renal ultrasound unremarkable with no hydronephrosis. --Cr 4.48>3.69 --Continue IV fluid hydration as above --Continue IV octreotide as above --Increase  Midodrine to 34m PO TID --Continue IV albumin --strict I/O's --Monitor renal function daily   Upper GI bleed: Appearance of dried blood on lips and oral mucosa.  Looks to have bitten  his tongue.   --Hemoglobin 10.3>5.6 --anemia panel --FOBT --transfuse 2u pRBC --H/H following transfusion --Continue IV Protonix, octreotide, ceftriaxone as above.   Alcohol use disorder: High risk for severe withdrawal.  Time of last drink unknown.  Patient's second cousin, TOlivia Mackiereports chronic alcoholism and has now switched to hard liquor drinking [name]  Apparently his life has spiraled downwards since the death of his father followed by divorce from his wife and now recent passing of his mother. --CIWA protocol with IV Ativan as needed. --TOC consult for substance abuse  Hypokalemia Potassium 3.0 this morning, will replete. --Follow electrolytes daily   Thrombocytopenia: Mild, Plts 77 this am --CBC daily   DVT prophylaxis: SCDs Start: 06/04/21 1953   Code Status: Full Code Family Communication: No family present at bedside this morning; updated patient's second cousin, TOlivia Mackievia telephone this morning.  Disposition Plan:  Level of care: Progressive Status is: Inpatient  Remains inpatient appropriate because:Hemodynamically unstable, Altered mental status, Ongoing diagnostic testing needed not appropriate for outpatient work up, Unsafe d/c plan, IV treatments appropriate due to intensity of illness or inability to take PO, and Inpatient level of care appropriate due to severity of illness  Dispo: The patient is from: Home              Anticipated d/c is to:  To be determined              Patient currently is not medically stable to d/c.   Difficult to place patient No  Consultants:  [facility]  Procedures:  none  Antimicrobials:  Ceftriaxone 9/4>> Cefepime 9/3-9/3   Subjective: Patient seen examined at bedside, resting comfortably.  Lethargic, sleeping and mildly confused.  Chronically ill/cachectic in appearance.  States just wants to rest.  Denies headache, no fever, no chest pain, no shortness of breath, no abdominal pain.  No family present at bedside.   RN present and performed skin exam this morning.  Hemoglobin dropped to 5.6 will transfuse 2 units of PRBCs.  Nursing also still concerned about his low blood pressure; but MAP remains adequate.  No other acute concerns overnight/this morning per nursing staff.  Objective: Vitals:   06/05/21 0530 06/05/21 0614 06/05/21 0616 06/05/21 0800  BP: 90/61 (!) 87/59  (!) 87/59  Pulse:    89  Resp: 15 13 17 16   Temp:  (!) 97.4 F (36.3 C)  98.5 F (36.9 C)  TempSrc:  Axillary  Oral  SpO2: 100% 99% 98% 98%  Weight:        Intake/Output Summary (Last 24 hours) at 06/05/2021 1147 Last data filed at 06/05/2021 0825 Gross per 24 hour  Intake 3809.22 ml  Output 340 ml  Net 3469.22 ml   Filed Weights   06/04/21 1600  Weight: 81.6 kg    Examination:  General exam: Appears calm and comfortable, chronically ill/cachectic in appearance and appears older than stated age HEENT: Noted scleral icterus Respiratory system: Clear to auscultation. Respiratory effort normal.  On room air Cardiovascular system: S1 & S2 heard, RRR. No JVD, murmurs, rubs, gallops or clicks. No pedal edema. Gastrointestinal system: Abdomen is nondistended, soft and nontender. No organomegaly or masses felt. Normal bowel sounds heard. Central nervous system: Alert but with episodes of somnolence. No focal neurological deficits. Extremities: Moves all extremities  independently Skin: Diffuse jaundice, pressure lesions left buttock and left hip  Psychiatry: Judgement and insight appear poor.       Data Reviewed: I have personally reviewed following labs and imaging studies  CBC: Recent Labs  Lab 06/04/21 1535 06/05/21 1008  WBC 9.4 7.4  NEUTROABS 7.6  --   HGB 10.3* 5.6*  HCT 31.5* 16.4*  MCV 100.3* 98.8  PLT 131* 77*   Basic Metabolic Panel: Recent Labs  Lab 06/04/21 1535 06/05/21 0214  NA 139 141  K 3.4* 3.0*  CL 90* 98  CO2 25 23  GLUCOSE 128* 117*  BUN 129* 121*  CREATININE 4.48* 3.69*  CALCIUM 9.2  8.2*  MG  --  2.4  PHOS  --  4.9*   GFR: Estimated Creatinine Clearance: 24.8 mL/min (A) (by C-G formula based on SCr of 3.69 mg/dL (H)). Liver Function Tests: Recent Labs  Lab 06/04/21 1535 06/05/21 0214  AST 157*  156* 85*  ALT 77*  75* 42  ALKPHOS 168*  157* 85  BILITOT 26.0*  26.0* 17.3*  PROT 10.0*  10.0* 6.8  ALBUMIN 2.8*  2.8* 2.8*   No results for input(s): LIPASE, AMYLASE in the last 168 hours. Recent Labs  Lab 06/04/21 1537  AMMONIA 31   Coagulation Profile: Recent Labs  Lab 06/04/21 1535 06/05/21 0214  INR 1.1 1.3*   Cardiac Enzymes: No results for input(s): CKTOTAL, CKMB, CKMBINDEX, TROPONINI in the last 168 hours. BNP (last 3 results) No results for input(s): PROBNP in the last 8760 hours. HbA1C: No results for input(s): HGBA1C in the last 72 hours. CBG: No results for input(s): GLUCAP in the last 168 hours. Lipid Profile: No results for input(s): CHOL, HDL, LDLCALC, TRIG, CHOLHDL, LDLDIRECT in the last 72 hours. Thyroid Function Tests: No results for input(s): TSH, T4TOTAL, FREET4, T3FREE, THYROIDAB in the last 72 hours. Anemia Panel: No results for input(s): VITAMINB12, FOLATE, FERRITIN, TIBC, IRON, RETICCTPCT in the last 72 hours. Sepsis Labs: Recent Labs  Lab 06/04/21 1535 06/04/21 1735 06/05/21 0214  PROCALCITON  --   --  6.51  LATICACIDVEN 4.5* 4.3*  --     Recent Results (from the past 240 hour(s))  Blood Culture (routine x 2)     Status: None (Preliminary result)   Collection Time: 06/04/21  3:35 PM   Specimen: BLOOD RIGHT HAND  Result Value Ref Range Status   Specimen Description BLOOD RIGHT HAND  Final   Special Requests   Final    BOTTLES DRAWN AEROBIC AND ANAEROBIC Blood Culture results may not be optimal due to an inadequate volume of blood received in culture bottles   Culture   Final    NO GROWTH < 12 HOURS Performed at [facility], [place] [address] [place], [place] [address]    Report Status PENDING   Incomplete  Resp Panel by RT-PCR (Flu A&B, Covid) Nasopharyngeal Swab     Status: None   Collection Time: 06/04/21  9:35 PM   Specimen: Nasopharyngeal Swab; Nasopharyngeal(NP) swabs in vial transport medium  Result Value Ref Range Status   SARS Coronavirus 2 by RT PCR NEGATIVE NEGATIVE Final    Comment: (NOTE) SARS-CoV-2 target nucleic acids are NOT DETECTED.  The SARS-CoV-2 RNA is generally detectable in upper respiratory specimens during the acute phase of infection. The lowest concentration of SARS-CoV-2 viral copies this assay can detect is 138 copies/mL. A negative result does not preclude SARS-Cov-2 infection and should not be used as the sole basis for  treatment or other patient management decisions. A negative result may occur with  improper specimen collection/handling, submission of specimen other than nasopharyngeal swab, presence of viral mutation(s) within the areas targeted by this assay, and inadequate number of viral copies(<138 copies/mL). A negative result must be combined with clinical observations, patient history, and epidemiological information. The expected result is Negative.  Fact Sheet for Patients:  EntrepreneurPulse.com.au  Fact Sheet for Healthcare Providers:  IncredibleEmployment.be  This test is no t yet approved or cleared by the [place] FDA and  has been authorized for detection and/or diagnosis of SARS-CoV-2 by FDA under an Emergency Use Authorization (EUA). This EUA will remain  in effect (meaning this test can be used) for the duration of the COVID-19 declaration under Section 564(b)(1) of the Act, 21 U.S.C.section 360bbb-3(b)(1), unless the authorization is terminated  or revoked sooner.       Influenza A by PCR NEGATIVE NEGATIVE Final   Influenza B by PCR NEGATIVE NEGATIVE Final    Comment: (NOTE) The Xpert Xpress SARS-CoV-2/FLU/RSV plus assay is intended as an aid in the diagnosis of influenza  from Nasopharyngeal swab specimens and should not be used as a sole basis for treatment. Nasal washings and aspirates are unacceptable for Xpert Xpress SARS-CoV-2/FLU/RSV testing.  Fact Sheet for Patients: EntrepreneurPulse.com.au  Fact Sheet for Healthcare Providers: IncredibleEmployment.be  This test is not yet approved or cleared by the [place] FDA and has been authorized for detection and/or diagnosis of SARS-CoV-2 by FDA under an Emergency Use Authorization (EUA). This EUA will remain in effect (meaning this test can be used) for the duration of the COVID-19 declaration under Section 564(b)(1) of the Act, 21 U.S.C. section 360bbb-3(b)(1), unless the authorization is terminated or revoked.  Performed at [facility], [place] [address], [place], [place] [address]          Radiology Studies: US RENAL  Result Date: 06/04/2021 CLINICAL DATA:  Acute renal failure EXAM: RENAL / URINARY TRACT ULTRASOUND COMPLETE COMPARISON:  None. FINDINGS: Right Kidney: Renal measurements: 11.6 x 5.8 x 6.5 cm = volume: 226 mL. Echogenicity within normal limits. No mass or hydronephrosis visualized. Left Kidney: Renal measurements: 10.6 x 5.8 x 5.1 cm = volume: 163 mL. Echogenicity within normal limits. No mass or hydronephrosis visualized. Bladder: Appears normal for degree of bladder distention. Other: None. IMPRESSION: Unremarkable renal ultrasound. Electronically Signed   By: Rolm Baptise M.D.   On: 06/04/2021 23:08   DG Chest Port 1 View  Result Date: 06/04/2021 CLINICAL DATA:  Questionable sepsis, EXAM: PORTABLE CHEST 1 VIEW COMPARISON:  March 13, 2021 FINDINGS: The heart size and mediastinal contours are within normal limits. Linear opacity in the left lung base, favor atelectasis. No pleural effusion. No pneumothorax. No acute osseous abnormality. Remote bilateral clavicle fractures. Remote left-sided rib fractures. IMPRESSION: Linear opacity in the  left lung base, favor atelectasis. Electronically Signed   By: Dahlia Bailiff M.D.   On: 06/04/2021 15:56   US Abdomen Limited RUQ (LIVER/GB)  Result Date: 06/04/2021 CLINICAL DATA:  Jaundice EXAM: ULTRASOUND ABDOMEN LIMITED RIGHT UPPER QUADRANT COMPARISON:  Abdominal ultrasound 11/29/2020 FINDINGS: Gallbladder: There are small gallstones. No gallbladder wall thickening. No sonographic Murphy sign noted by sonographer. Common bile duct: Diameter: 0.4 cm, within normal limits Liver: No focal lesion identified. Parenchymal echogenicity is diffusely increased. Portal vein is patent on color Doppler imaging with normal direction of blood flow towards the liver. Other: None. IMPRESSION: 1.  No acute finding sonographically in the abdomen.  2.  Cholelithiasis without evidence of acute cholecystitis. 3. Diffusely increased liver parenchymal echogenicity which is nonspecific but most commonly seen with hepatic steatosis. Electronically Signed   By: Audie Pinto M.D.   On: 06/04/2021 18:15        Scheduled Meds:  sodium chloride   Intravenous Once   midodrine  10 mg Oral Q8H   multivitamin with minerals  1 tablet Oral Daily   potassium chloride  40 mEq Oral Q3H   sodium chloride flush  3 mL Intravenous Q12H   thiamine  100 mg Oral Daily   Or   thiamine  100 mg Intravenous Daily   Continuous Infusions:  [START ON 06/06/2021] albumin human     cefTRIAXone (ROCEPHIN)  IV 2 g (06/05/21 0818)   lactated ringers 100 mL/hr at 06/05/21 0818   octreotide  (SANDOSTATIN)    IV infusion 50 mcg/hr (06/05/21 0122)   pantoprazole 8 mg/hr (06/05/21 0351)     LOS: 1 day    Time spent: 45 minutes spent on chart review, discussion with nursing staff, consultants, updating family and interview/physical exam; more than 50% of that time was spent in counseling and/or coordination of care.    Marvie J [place] DO [facility] Available via Epic secure chat 7am-7pm After these hours, please refer to  coverage provider listed on [email or web address] 06/05/2021, 11:47 AM

## 2021-06-05 NOTE — Consult Note (Addendum)
Referring Provider:  EDP Primary Care Physician:  [facility], [facility] Primary Gastroenterologist:  Althia Forts  Reason for Consultation:  Elevated LFTs, ETOH abuse  HPI: Dylan Weaver is a 55 y.o. male with medical history significant for severe alcohol abuse who presented to the ED via EMS for evaluation of weakness and jaundice.  History is limited from patient due to encephalopathy/AMS and is otherwise supplemented by chart review.  Patient was last seen by family about a week ago.  He lives alone.  They did not hear from him and went to check in on him on 9/3.  He appeared newly jaundiced and ill.  EMS were called and he was brought to the ED for further evaluation.   Labs significant for total bilirubin 26.0, AST 156, ALT 75, alk phos 157, BUN 129, creatinine 4.48 (previously 0.70 on 03/13/2021), sodium 139, potassium 3.4, bicarb 25, serum glucose 128, lactic acid 4.5, WBC 9.4K, hemoglobin 10.3 grams, platelets 131K, INR 1.1, ammonia 31.  RUQ ultrasound was negative for acute findings.  Cholelithiasis without evidence of acute cholecystitis noted.  Nonspecific increased liver parenchymal echogenicity seen most commonly seen with hepatic steatosis.  He was started on octreotide, albumin, midodrine for possible HRS.  Creatinine is improved this morning.  LFTs are also improved this morning with almost normalization of all LFTs except bilirubin which is down to 17 today.  Acute viral hepatitis panel is negative.  No bowel movements since hospitalization.  His Hgb is 5.6 grams this morning down from 10.3 grams yesterday.  This was just resulted (this was actually the second time that it returned low but the first time they thought it was spurious so it was not recorded).  He is on PPI gtt.  Patient still unable to provide history.  Past Medical History:  Diagnosis Date   ETOH abuse     Past Surgical History:  Procedure Laterality Date   NASAL SEPTUM SURGERY     WRIST  SURGERY      Prior to Admission medications   Medication Sig Start Date End Date Taking? Authorizing Provider  doxycycline (VIBRAMYCIN) 100 MG capsule Take 1 capsule (100 mg total) by mouth 2 (two) times daily. 03/14/21   Lucrezia Starch, MD  folic acid (FOLVITE) 1 MG tablet Take 1 tablet (1 mg total) by mouth daily. 12/04/20   Bonnell Public, MD  ketoconazole (NIZORAL) 2 % cream Apply topically 2 (two) times daily. Patient not taking: No sig reported 12/04/20   Bonnell Public, MD  liver oil-zinc oxide (DESITIN) 40 % ointment Apply topically 2 (two) times daily. Patient not taking: No sig reported 12/04/20   Bonnell Public, MD  Multiple Vitamin (MULTIVITAMIN WITH MINERALS) TABS tablet Take 1 tablet by mouth daily before breakfast. Patient not taking: No sig reported 12/04/20   Dana Allan I, MD  Nystatin (GERHARDT'S BUTT CREAM) CREA Apply 1 application topically 2 (two) times daily. Patient not taking: No sig reported 12/04/20   Bonnell Public, MD    Current Facility-Administered Medications  Medication Dose Route Frequency Provider Last Rate Last Admin   [START ON 06/06/2021] albumin human 25 % solution 25 g  25 g Intravenous Q24H Zada Finders R, MD       cefTRIAXone (ROCEPHIN) 2 g in sodium chloride 0.9 % 100 mL IVPB  2 g Intravenous Q24H [place] Manley Fason, DO 200 mL/hr at 06/05/21 0818 2 g at 06/05/21 0818   lactated ringers infusion   Intravenous Continuous [place] Sheriff  J, DO 100 mL/hr at 06/05/21 0818 New Bag at 06/05/21 0818   LORazepam (ATIVAN) tablet 1-4 mg  1-4 mg Oral Q1H PRN Lenore Cordia, MD       Or   LORazepam (ATIVAN) injection 1-4 mg  1-4 mg Intravenous Q1H PRN Lenore Cordia, MD       midodrine (PROAMATINE) tablet 10 mg  10 mg Oral Q8H [place] Donnamarie Poag, DO       multivitamin with minerals tablet 1 tablet  1 tablet Oral Daily Patel, Vishal R, MD       octreotide (SANDOSTATIN) 500 mcg in sodium chloride 0.9 % 250 mL (2 mcg/mL) infusion  50 mcg/hr  Intravenous Continuous Lenore Cordia, MD 25 mL/hr at 06/05/21 0122 50 mcg/hr at 06/05/21 0122   ondansetron (ZOFRAN) tablet 4 mg  4 mg Oral Q6H PRN Lenore Cordia, MD       Or   ondansetron (ZOFRAN) injection 4 mg  4 mg Intravenous Q6H PRN Lenore Cordia, MD       pantoprozole (PROTONIX) 80 mg /NS 100 mL infusion  8 mg/hr Intravenous Continuous Luna Fuse, MD 10 mL/hr at 06/05/21 0351 8 mg/hr at 06/05/21 0351   sodium chloride flush (NS) 0.9 % injection 3 mL  3 mL Intravenous Q12H Zada Finders R, MD   3 mL at 06/05/21 0825   thiamine tablet 100 mg  100 mg Oral Daily Lenore Cordia, MD       Or   thiamine (B-1) injection 100 mg  100 mg Intravenous Daily Zada Finders R, MD   100 mg at 06/05/21 6384    Allergies as of 06/04/2021   (No Known Allergies)    Family History  Family history unknown: Yes    Social History   Socioeconomic History   Marital status: Divorced    Spouse name: Not on file   Number of children: Not on file   Years of education: Not on file   Highest education level: Not on file  Occupational History   Not on file  Tobacco Use   Smoking status: Every Day   Smokeless tobacco: Never  Vaping Use   Vaping Use: Never used  Substance and Sexual Activity   Alcohol use: Yes    Comment: 12 pack a day beer   Drug use: Never   Sexual activity: Not Currently  Other Topics Concern   Not on file  Social History Narrative   Not on file   Social Determinants of Health   Financial Resource Strain: Not on file  Food Insecurity: Not on file  Transportation Needs: Not on file  Physical Activity: Not on file  Stress: Not on file  Social Connections: Not on file  Intimate Partner Violence: Not on file    Review of Systems: ROS is unable to be obtained due to patient's AMS.  Physical Exam: Vital signs in last 24 hours: Temp:  [97.4 F (36.3 C)-98.5 F (36.9 C)] 98.5 F (36.9 C) (09/04 0800) Pulse Rate:  [36-103] 89 (09/04 0800) Resp:  [13-21] 17  (09/04 0616) BP: (72-116)/(47-79) 87/59 (09/04 0614) SpO2:  [97 %-100 %] 98 % (09/04 0616) Weight:  [81.6 kg] 81.6 kg (09/03 1600) Last BM Date:  (PTA) General:  Minimally responsive.  Arouses, but not easily.  Jaundice noted. Head:  Normocephalic and atraumatic. Eyes:  Deep scleral icterus. Ears:  Normal auditory acuity. Mouth:  No deformity or lesions.   Lungs:  Clear throughout to auscultation.  No wheezes, crackles, or rhonchi.  Heart:  Regular rate and rhythm; no murmurs, clicks, rubs, or gallops. Abdomen:  Soft, non-distended.  BS present.  Non-tender. Msk:  Symmetrical without gross deformities. Extremities:  No edema.  Intake/Output from previous day: 09/03 0701 - 09/04 0700 In: 3806.2 [I.V.:1658.8; IV Piggyback:2147.4] Out: 340 [Urine:340] Intake/Output this shift: Total I/O In: 3 [I.V.:3] Out: -   Lab Results: Recent Labs    06/04/21 1535 06/05/21 1008  WBC 9.4 7.4  HGB 10.3* 5.6*  HCT 31.5* 16.4*  PLT 131* 77*   BMET Recent Labs    06/04/21 1535 06/05/21 0214  NA 139 141  K 3.4* 3.0*  CL 90* 98  CO2 25 23  GLUCOSE 128* 117*  BUN 129* 121*  CREATININE 4.48* 3.69*  CALCIUM 9.2 8.2*   LFT Recent Labs    06/05/21 0214  PROT 6.8  ALBUMIN 2.8*  AST 85*  ALT 42  ALKPHOS 85  BILITOT 17.3*  BILIDIR 9.7*  IBILI 7.6*   PT/INR Recent Labs    06/04/21 1535 06/05/21 0214  LABPROT 14.2 15.8*  INR 1.1 1.3*   Hepatitis Panel Recent Labs    06/04/21 1900  HEPBSAG NON REACTIVE  HCVAB NON REACTIVE  HEPAIGM NON REACTIVE  HEPBIGM NON REACTIVE    Studies/Results: US RENAL  Result Date: 06/04/2021 CLINICAL DATA:  Acute renal failure EXAM: RENAL / URINARY TRACT ULTRASOUND COMPLETE COMPARISON:  None. FINDINGS: Right Kidney: Renal measurements: 11.6 x 5.8 x 6.5 cm = volume: 226 mL. Echogenicity within normal limits. No mass or hydronephrosis visualized. Left Kidney: Renal measurements: 10.6 x 5.8 x 5.1 cm = volume: 163 mL. Echogenicity within normal  limits. No mass or hydronephrosis visualized. Bladder: Appears normal for degree of bladder distention. Other: None. IMPRESSION: Unremarkable renal ultrasound. Electronically Signed   By: Rolm Baptise M.D.   On: 06/04/2021 23:08   DG Chest Port 1 View  Result Date: 06/04/2021 CLINICAL DATA:  Questionable sepsis, EXAM: PORTABLE CHEST 1 VIEW COMPARISON:  March 13, 2021 FINDINGS: The heart size and mediastinal contours are within normal limits. Linear opacity in the left lung base, favor atelectasis. No pleural effusion. No pneumothorax. No acute osseous abnormality. Remote bilateral clavicle fractures. Remote left-sided rib fractures. IMPRESSION: Linear opacity in the left lung base, favor atelectasis. Electronically Signed   By: Dahlia Bailiff M.D.   On: 06/04/2021 15:56   US Abdomen Limited RUQ (LIVER/GB)  Result Date: 06/04/2021 CLINICAL DATA:  Jaundice EXAM: ULTRASOUND ABDOMEN LIMITED RIGHT UPPER QUADRANT COMPARISON:  Abdominal ultrasound 11/29/2020 FINDINGS: Gallbladder: There are small gallstones. No gallbladder wall thickening. No sonographic Murphy sign noted by sonographer. Common bile duct: Diameter: 0.4 cm, within normal limits Liver: No focal lesion identified. Parenchymal echogenicity is diffusely increased. Portal vein is patent on color Doppler imaging with normal direction of blood flow towards the liver. Other: None. IMPRESSION: 1.  No acute finding sonographically in the abdomen. 2.  Cholelithiasis without evidence of acute cholecystitis. 3. Diffusely increased liver parenchymal echogenicity which is nonspecific but most commonly seen with hepatic steatosis. Electronically Signed   By: Audie Pinto M.D.   On: 06/04/2021 18:15    IMPRESSION:  *ETOH hepatitis:  LFTs improved this AM, total bili down from 26 to 17.3 this morning.  All other LFTs about normal.  Acute viral hep panel is negative.  This morning DF is 29, no indication for steroids plus patient being treated for  infection. *Anemia:  Acute drop in Hgb from 10.3 grams yesterday to  5.6 grams this morning, which has just resulted (this was actually the second time that it returned low but the first time they thought it was spurious so it was not recorded).  He is on PPI gtt.  He has had no sign of GI bleeding, no stools so far. *Mild thrombocytopenia at 131 *AKI:  Cr 4.48 from 0.70 two months ago.  It is somewhat imprved at 3.69 this morning.  There was concern for hepatorenal syndrome, but he has not had any definite diagnosis of cirrhosis.  He is minimally responsive and has not been in contact with any family recently so my suspicion it was a prerenal azotemia from dehydration, etc.  Nonetheless he is on octreotide, midodrine, and albumin for now. *Hypokalemia with K+ of 3.0  PLAN: -Trend labs, LFTs, PT/INR, renal function, Hgb, etc. -Replacement of K+ by primary service. -Transfuse for his Hgb of 5.6 grams. -Otherwise supportive care as is being done. -Will need to discontinue ETOH use. -Likely will need an EGD at some point.  Laban Emperor. Zehr  06/05/2021, 11:12 AM   ________________________________________________________________________  Velora Heckler GI MD note:  I personally examined the patient, reviewed the data and agree with the assessment and plan described above. I spoke with his cousin Linus Orn and his aunt Loletha Carrow on the phone just now. He has been drinking beer, wine and whiskey basically non-stop for weeks to months. He was found by family yesterday, barely responsive surrounded by cups of his urine, many empty bottles of alcohol. They actually thought he was dead but then he moved and so they called 911.  Labs show severe etoh hepatitis (discriminant function 30 so steroids are not indicated).  He has some old blood on his lips, but has not had any overt bleeding since he's been at the hospital.  His Hb is in 5s, I think his initial Hb 10 was probably elevated due to severe dehydration. He is  scheduled to get 2 units of blood, I agree with octreotide and PPI drips, empiric IV abx for now.  I briefly discussed code status with Vickie (aunt) who seems to be his medical decision maker and she was unable to really make a decision so for now he is full code.  We will follow along.  He will probably need endoscopic testing this admission, timing to be determined on his clinical course, next few days.   Owens Loffler, MD [facility] Pager [phone]

## 2021-06-05 NOTE — Evaluation (Signed)
Clinical/Bedside Swallow Evaluation Patient Details  Name: Dylan Weaver MRN: [id number] Date of Birth: [birth year]  Today's Date: 06/05/2021 Time: SLP Start Time (ACUTE ONLY): 1432 SLP Stop Time (ACUTE ONLY): 1455 SLP Time Calculation (min) (ACUTE ONLY): 23 min  Past Medical History:  Past Medical History:  Diagnosis Date   ETOH abuse    Past Surgical History:  Past Surgical History:  Procedure Laterality Date   NASAL SEPTUM SURGERY     WRIST SURGERY     HPI:  Pt is a 55 y.o. male who presented to the ED via EMS for evaluation of weakness and jaundice. CXR on admission: Linear opacity in the left lung base, favor atelectasis. Dx encephalopathy and acute liver failure thought to be 2/2 severe EtOH abuse. PMH: severe alcohol abuse.   Assessment / Plan / Recommendation Clinical Impression  Pt was seen for bedside swallow evaluation and he denied a history of dysphagia. Pt's RNs indicated that the pt may have p.o. trials for the evaluation, but requested that diet order not be placed since there is concern for GIB. Pt was adequately alert throughout the evaluation and Evelene Croon, RN stated that this has been variable throughout the shift. Oral motor strength and ROM appeared grossly WFL. Dried blood was noted in the oral cavity and per EMR, it is suspected that the pt bit his tongue. Pt presented with a wet vocal quality at baseline which was eliminated with prompted coughing. Pt demonstrated mild oral residue, inconsistent signs of aspiration with dysphagia 3 and mixed consistency boluses, and prolonged mastication with solids which improved with additional boluses. A dysphagia 1 diet with thin liquids is recommended at this time pending MD clearance d/t concern for GIB. SLP will follow to assess diet tolerance and for advancement as clinically indicated. SLP Visit Diagnosis: Dysphagia, unspecified (R13.10)    Aspiration Risk  Mild aspiration risk    Diet Recommendation Dysphagia 1  (Puree);Thin liquid (Pending GI clearance d/t concern for GIB)   Liquid Administration via: Cup;Straw Medication Administration: Crushed with puree Supervision: Staff to assist with self feeding Compensations: Slow rate;Small sips/bites;Minimize environmental distractions Postural Changes: Seated upright at 90 degrees    Other  Recommendations Oral Care Recommendations: Oral care BID   Follow up Recommendations  (TBD)      Frequency and Duration min 2x/week  2 weeks       Prognosis Prognosis for Safe Diet Advancement: Good Barriers to Reach Goals: Cognitive deficits      Swallow Study   General Date of Onset: 06/04/21 HPI: Pt is a 55 y.o. male who presented to the ED via EMS for evaluation of weakness and jaundice. CXR on admission: Linear opacity in the left lung base, favor atelectasis. Dx encephalopathy and acute liver failure thought to be 2/2 severe EtOH abuse. PMH: severe alcohol abuse. Type of Study: Bedside Swallow Evaluation Previous Swallow Assessment: none Diet Prior to this Study: NPO Temperature Spikes Noted: No Respiratory Status: Room air History of Recent Intubation: No Behavior/Cognition: Alert;Cooperative;Confused;Requires cueing Oral Care Completed by SLP: Recent completion by staff Oral Cavity - Dentition: Adequate natural dentition Vision: Functional for self-feeding Self-Feeding Abilities: Needs assist Patient Positioning: Upright in bed;Postural control adequate for testing Baseline Vocal Quality: Wet Volitional Cough: Strong Volitional Swallow: Able to elicit    Oral/Motor/Sensory Function Overall Oral Motor/Sensory Function: Within functional limits   Ice Chips Ice chips: Within functional limits Presentation: Spoon   Thin Liquid Thin Liquid: Within functional limits Presentation: Straw    Nectar Thick Nectar  Thick Liquid: Not tested   Honey Thick Honey Thick Liquid: Not tested   Puree Puree: Within functional limits Presentation: Spoon    Solid     Solid: Impaired Presentation: Spoon Oral Phase Impairments: Impaired mastication Oral Phase Functional Implications: Impaired mastication Pharyngeal Phase Impairments: Wet Vocal Quality;Cough - Delayed     Rory Xiang I. Vear Clock, MS, CCC-SLP Acute Rehabilitation Services Office number [phone] Pager [phone]  Scheryl Marten 06/05/2021,3:19 PM

## 2021-06-05 NOTE — Progress Notes (Signed)
Pt. Dylan Weaver. MD notified. EKG ordered and performed.

## 2021-06-05 NOTE — Progress Notes (Signed)
HOSPITAL MEDICINE OVERNIGHT EVENT NOTE    Notified by nursing that lab notes that hemoglobin has dropped by 5 g compared to yesterday.  Patient has exhibited no change in mentation, no evidence of chest pain or shortness of breath and no clinical evidence of bleeding.  This sudden dramatic drop in hemoglobin is likely erroneous.  Labs will not release this and will instead, redraw a CBC.  Marinda Elk  MD [facility]

## 2021-06-05 NOTE — Consult Note (Signed)
WOC Nurse Consult Note: Reason for Consult:left buttock open area that is not consistent with a pressure injury, but presentation is similar to a skin tear.  Full thickness, shallow, dry.  See photo in EHR.  The left hip is with several round, punctate lesions, full thickness, similar in presentation to bites rather than lesions of other origin (moisture, pressure, scratching, trauma) Wound type: trauma (skin tear), infectious (suspected bites) Pressure Injury POA: N/A Measurement:To be obtained by Bedside RN and documented on the Nursing Flow Sheet  Wound bed:per photos.  Red, dry Drainage (amount, consistency, odor) none to scant serous Periwound: intact with evidence of excoriation (scratching) I.e., linear partial thickness areas in vicinity of wounds Dressing procedure/placement/frequency: I have provided Nursing with conservative care guidance for the daily care of the above lesions using soap and water to cleanse, rinse with tap water and cover with an antimicrobial nonadherent dressing, Xeroform [place] # [phone]). This is to be covered with a dry gauze and secured with a square silicone foam dressing. For PI prevention, turning and repositioning is in place, heels are to be floated and a sacral foam placed prophylactically to the sacrum.  WOC nursing team will not follow, but will remain available to this patient, the nursing and medical teams.  Please re-consult if needed. Thanks, Ladona Mow, MSN, RN, GNP, [place]  Pager# [phone]

## 2021-06-05 NOTE — Progress Notes (Addendum)
   06/05/21 0614  Assess: MEWS Score  Temp (!) 97.4 F (36.3 C)  BP (!) 87/59  ECG Heart Rate 80  Resp 13  Level of Consciousness Alert  SpO2 99 %  O2 Device Room Air  Assess: MEWS Score  MEWS Temp 0  MEWS Systolic 1  MEWS Pulse 0  MEWS RR 1  MEWS LOC 0  MEWS Score 2  MEWS Score Color Yellow  Assess: if the MEWS score is Yellow or Red  Were vital signs taken at a resting state? Yes  Focused Assessment No change from prior assessment  Early Detection of Sepsis Score *See Row Information* Low  Treat  Pain Scale 0-10  Pain Score 0  Take Vital Signs  Increase Vital Sign Frequency  Yellow: Q 2hr X 2 then Q 4hr X 2, if remains yellow, continue Q 4hrs  Escalate  MEWS: Escalate Yellow: discuss with charge nurse/RN and consider discussing with provider and RRT  Notify: Charge Nurse/RN  Name of Charge Nurse/RN Notified tim rn  Date Charge Nurse/RN Notified 06/05/21  Time Charge Nurse/RN Notified 0617  Notify: Provider  Provider Name/Title Shalhoub MD  Date Provider Notified 06/05/21  Time Provider Notified [phone]  Notification Type Page  Notification Reason Other (Comment) (Lab called to notify this RN that pt's HGB drooped by 5 pts. MD notified to see if he wants lab redrawn or other advise.)  No new bleeding noted, patient resting quietly in the bed, alert and oriented  to self.

## 2021-06-06 DIAGNOSIS — K7201 Acute and subacute hepatic failure with coma: Secondary | ICD-10-CM | POA: Diagnosis not present

## 2021-06-06 LAB — BPAM RBC
Blood Product Expiration Date: 202209212359
Blood Product Expiration Date: 202209242359
ISSUE DATE / TIME: 202209041429
ISSUE DATE / TIME: 202209041722
Unit Type and Rh: 600
Unit Type and Rh: 600

## 2021-06-06 LAB — CBC
HCT: 26.3 % — ABNORMAL LOW (ref 39.0–52.0)
Hemoglobin: 8.8 g/dL — ABNORMAL LOW (ref 13.0–17.0)
MCH: 31.7 pg (ref 26.0–34.0)
MCHC: 33.5 g/dL (ref 30.0–36.0)
MCV: 94.6 fL (ref 80.0–100.0)
Platelets: 62 10*3/uL — ABNORMAL LOW (ref 150–400)
RBC: 2.78 MIL/uL — ABNORMAL LOW (ref 4.22–5.81)
RDW: 18.7 % — ABNORMAL HIGH (ref 11.5–15.5)
WBC: 7.1 10*3/uL (ref 4.0–10.5)
nRBC: 3.7 % — ABNORMAL HIGH (ref 0.0–0.2)

## 2021-06-06 LAB — TYPE AND SCREEN
ABO/RH(D): A NEG
Antibody Screen: NEGATIVE
Unit division: 0
Unit division: 0

## 2021-06-06 LAB — COMPREHENSIVE METABOLIC PANEL
ALT: 46 U/L — ABNORMAL HIGH (ref 0–44)
AST: 107 U/L — ABNORMAL HIGH (ref 15–41)
Albumin: 2.8 g/dL — ABNORMAL LOW (ref 3.5–5.0)
Alkaline Phosphatase: 90 U/L (ref 38–126)
Anion gap: 9 (ref 5–15)
BUN: 72 mg/dL — ABNORMAL HIGH (ref 6–20)
CO2: 28 mmol/L (ref 22–32)
Calcium: 9.2 mg/dL (ref 8.9–10.3)
Chloride: 109 mmol/L (ref 98–111)
Creatinine, Ser: 1.91 mg/dL — ABNORMAL HIGH (ref 0.61–1.24)
GFR, Estimated: 41 mL/min — ABNORMAL LOW (ref >60)
Glucose, Bld: 119 mg/dL — ABNORMAL HIGH (ref 70–99)
Potassium: 3 mmol/L — ABNORMAL LOW (ref 3.5–5.1)
Sodium: 146 mmol/L — ABNORMAL HIGH (ref 135–145)
Total Bilirubin: 19.5 mg/dL (ref 0.3–1.2)
Total Protein: 6.4 g/dL — ABNORMAL LOW (ref 6.5–8.1)

## 2021-06-06 LAB — URINE CULTURE: Culture: NO GROWTH

## 2021-06-06 LAB — PROTIME-INR
INR: 1.2 (ref 0.8–1.2)
Prothrombin Time: 15.6 seconds — ABNORMAL HIGH (ref 11.4–15.2)

## 2021-06-06 LAB — MAGNESIUM: Magnesium: 2.1 mg/dL (ref 1.7–2.4)

## 2021-06-06 MED ORDER — GABAPENTIN 100 MG PO CAPS
100.0000 mg | ORAL_CAPSULE | Freq: Three times a day (TID) | ORAL | Status: DC
  Administered 2021-06-06 – 2021-07-05 (×86): 100 mg via ORAL
  Filled 2021-06-06 (×87): qty 1

## 2021-06-06 MED ORDER — PANTOPRAZOLE SODIUM 40 MG IV SOLR
40.0000 mg | Freq: Two times a day (BID) | INTRAVENOUS | Status: DC
  Administered 2021-06-06 – 2021-06-07 (×3): 40 mg via INTRAVENOUS
  Filled 2021-06-06 (×3): qty 40

## 2021-06-06 NOTE — Progress Notes (Signed)
PROGRESS NOTE    Dylan Weaver  MIW:[id number] DOB: 1966/06/05 DOA: 06/04/2021 PCP: [facility]    Brief Narrative:  Dylan Weaver is a 55 y.o. 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  History is limited from patient due to encephalopathy and is otherwise supplemented by EDP and chart review. Patient was last seen by family about a week ago.  He lives alone.  I did not hear from him and went to check in on him today.  He appeared newly jaundiced and ill.  EMS were called and he was brought to the ED for further evaluation.  Patient states that he has just been feeling sluggish.  He denies any vomiting.  When asked if he bit his tongue he says yes.  He is otherwise not answering further questions to assess for last alcohol use, history of seizures, pain, etc.   In the ED, BP 72/62, pulse 105, RR 21, temp 97.6 F, SPO2 97% on room air. Total bilirubin 26.0, AST 156, ALT 75, alk phos 157, BUN 129, creatinine 4.48 (previously 0.70 on 03/13/2021), sodium 139, potassium 3.4, bicarb 25, serum glucose 128, lactic acid 4.5, WBC 9.4, hemoglobin 10.3, platelets 131,000, INR 1.1, ammonia 31.  Acute hepatitis panel, acetaminophen levels obtained and pending.  Blood cultures collected and pending.  SARS-CoV-2 PCR panel collected and pending.   Portable chest x-ray shows linear opacity in the left lung base favoring atelectasis and remote left-sided rib fractures and bilateral clavicle fractures.  RUQ ultrasound was negative for acute findings.  Cholelithiasis without evidence of acute cholecystitis noted.  Nonspecific increased liver parenchymal echogenicity seen.   Patient was given 2.25 L LR followed by maintenance fluids.  He was also given IV Protonix 80 mg bolus followed by continuous infusion, IV octreotide 50 mcg, IV  cefepime.  EDP discussed with on-call GI who felt this was acute liver failure related to alcohol use and will see in consulation.  Hospitalist service was consulted for admission to further evaluate and manage acute liver failure in the setting of severe EtOH abuse, hypotension, hyperbilirubinemia, and acute renal failure.   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  Patient severely jaundiced and encephalopathic on admission.  He was hypotensive and tachycardic on arrival with question of upper GI bleed therefore started on aggressive therapy with IV fluid resuscitation, antibiotics, PPI, octreotide.  RUQ ultrasound without acute findings.  Cholelithiasis without acute cholecystitis and hepatic steatosis changes noted.  Acute hepatitis panel negative. --[facility] following; appreciate assistance --MELD-Na on admission>>33; correlating to 65-66% 90-day mortality --MDF on admission = 7.6; would not benefit from steroids --AST 156>85>107 --ALT 75>42>46 --Tbili 26>17.3>19.5 --Octreotide discontinued today by GI --Continue Protonix 40mg  IV q12h --Ceftriaxone 2 g IV daily x 7 days --LR @ 100 mL/hour --continue Albumin IV --Continue to monitor LFTs, PT/INR daily   Acute renal failure: Last creatinine 0.70 on 03/13/2021.  BUN 129 with creatinine 4.48 on admission.  Concern for possible hepatorenal syndrome.  Renal ultrasound unremarkable with no hydronephrosis. --Cr 4.48>3.69>1.91 --Continue IV fluid hydration as  above --Continue Midodrine 82m PO TID --Continue IV albumin --strict I/O's --Monitor renal function daily   Upper GI bleed: Anemia of chronic disease, folic acid  deficiency Appearance of dried blood on lips and oral mucosa.  Looks to have bitten his tongue.  Anemia panel with iron 204, TIBC 329, ferritin 84, folic acid low at 4.3.  S/p 2u pRBC 9/4. --Hemoglobin 10.3>5.6>8.8 --Continue IV Protonix, ceftriaxone as above. --Folic acid daily --GI anticipates need for EGD this admission --CBC in the a.m.   Alcohol use disorder: High risk for severe withdrawal.  Time of last drink unknown.  Patient's second cousin, TOlivia Mackiereports chronic alcoholism and has now switched to hard liquor drinking [name]  Apparently his life has spiraled downwards since the death of his father followed by divorce from his wife and now recent passing of his mother. --CIWA protocol with IV Ativan as needed. --TOC following for substance abuse  Hypokalemia Potassium 3.0 this morning, will replete. --Follow electrolytes daily   Thrombocytopenia: Plts 67 this am --CBC daily  Weakness/debility/deconditioning: --PT/OT evaluation   DVT prophylaxis: SCDs Start: 06/04/21 1953   Code Status: Full Code Family Communication: No family present at bedside this morning; updated patient's second cousin, TOlivia Mackievia telephone  Disposition Plan:  Level of care: Progressive Status is: Inpatient  Remains inpatient appropriate because:Hemodynamically unstable, Altered mental status, Ongoing diagnostic testing needed not appropriate for outpatient work up, Unsafe d/c plan, IV treatments appropriate due to intensity of illness or inability to take PO, and Inpatient level of care appropriate due to severity of illness  Dispo: The patient is from: Home              Anticipated d/c is to:  To be determined              Patient currently is not medically stable to d/c.   Difficult to place patient No  Consultants:  [facility]  Procedures:  none  Antimicrobials:  Ceftriaxone 9/4>> Cefepime 9/3-9/3   Subjective: Patient seen examined at bedside, resting comfortably.  Reviewing  breakfast menu this morning.  Less confused and alert and oriented.  Hemoglobin up to 8.8.  Patient does not recall events leading to hospitalization.  Discussed extensively need for complete alcohol cessation given his liver failure.  No other questions or concerns at this time.  Seen by GI this morning and discontinued octreotide drip and changed to Protonix drip to IV every 12 hours.  Likely will need EGD next few days.  Patient denies headache, no chest pain, no palpitations, no shortness of breath, no abdominal pain.  No acute events overnight per nursing staff.  Objective: Vitals:   06/06/21 0104 06/06/21 0334 06/06/21 0727 06/06/21 0810  BP: (!) 86/61 96/68 102/83 103/76  Pulse: 70 76 78   Resp: _0 (!) 21  Temp: 97.8 F (36.6 C) (!) 97.3 F (36.3 C) (!) 97.5 F (36.4 C) 97.7 F (36.5 C)  TempSrc: Oral Oral Oral Oral  SpO2: 99% 96% 99% 97%  Weight:        Intake/Output Summary (Last 24 hours) at 06/06/2021 1116 Last data filed at 06/06/2021 0500 Gross per 24 hour  Intake 700 ml  Output 1600 ml  Net -900 ml   Filed Weights   06/04/21 1600  Weight: 81.6 kg    Examination:  General exam: Appears calm and comfortable, chronically ill/cachectic in appearance and appears older than stated age HEENT: Noted scleral icterus Respiratory system: Clear to auscultation. Respiratory effort  normal.  On room air Cardiovascular system: S1 & S2 heard, RRR. No JVD, murmurs, rubs, gallops or clicks. No pedal edema. Gastrointestinal system: Abdomen is nondistended, soft and nontender. No organomegaly or masses felt. Normal bowel sounds heard. Central nervous system: Alert but with episodes of somnolence. No focal neurological deficits. Extremities: Moves all extremities independently Skin: Diffuse jaundice, pressure lesions left buttock and left hip  Psychiatry: Judgement and insight appear poor.       Data Reviewed: I have personally reviewed following labs and imaging  studies  CBC: Recent Labs  Lab 06/04/21 1535 06/05/21 1008 06/06/21 0811  WBC 9.4 7.4 7.1  NEUTROABS 7.6  --   --   HGB 10.3* 5.6* 8.8*  HCT 31.5* 16.4* 26.3*  MCV 100.3* 98.8 94.6  PLT 131* 77* 62*   Basic Metabolic Panel: Recent Labs  Lab 06/04/21 1535 06/05/21 0214 06/06/21 0811  NA 139 141 146*  K 3.4* 3.0* 3.0*  CL 90* 98 109  CO2 _0 GLUCOSE 128* 117* 119*  BUN 129* 121* 72*  CREATININE 4.48* 3.69* 1.91*  CALCIUM 9.2 8.2* 9.2  MG  --  2.4 2.1  PHOS  --  4.9*  --    GFR: Estimated Creatinine Clearance: 48 mL/min (A) (by C-G formula based on SCr of 1.91 mg/dL (H)). Liver Function Tests: Recent Labs  Lab 06/04/21 1535 06/05/21 0214 06/06/21 0811  AST 157*  156* 85* 107*  ALT 77*  75* 42 46*  ALKPHOS 168*  157* 85 90  BILITOT 26.0*  26.0* 17.3* 19.5*  PROT 10.0*  10.0* 6.8 6.4*  ALBUMIN 2.8*  2.8* 2.8* 2.8*   No results for input(s): LIPASE, AMYLASE in the last 168 hours. Recent Labs  Lab 06/04/21 1537  AMMONIA 31   Coagulation Profile: Recent Labs  Lab 06/04/21 1535 06/05/21 0214 06/06/21 0811  INR 1.1 1.3* 1.2   Cardiac Enzymes: No results for input(s): CKTOTAL, CKMB, CKMBINDEX, TROPONINI in the last 168 hours. BNP (last 3 results) No results for input(s): PROBNP in the last 8760 hours. HbA1C: No results for input(s): HGBA1C in the last 72 hours. CBG: No results for input(s): GLUCAP in the last 168 hours. Lipid Profile: No results for input(s): CHOL, HDL, LDLCALC, TRIG, CHOLHDL, LDLDIRECT in the last 72 hours. Thyroid Function Tests: No results for input(s): TSH, T4TOTAL, FREET4, T3FREE, THYROIDAB in the last 72 hours. Anemia Panel: Recent Labs    06/05/21 1258  RETICCTPCT 3.2*   Sepsis Labs: Recent Labs  Lab 06/04/21 1535 06/04/21 1735 06/05/21 0214  PROCALCITON  --   --  6.51  LATICACIDVEN 4.5* 4.3*  --     Recent Results (from the past 240 hour(s))  Blood Culture (routine x 2)     Status: None (Preliminary  result)   Collection Time: 06/04/21  3:35 PM   Specimen: BLOOD RIGHT HAND  Result Value Ref Range Status   Specimen Description BLOOD RIGHT HAND  Final   Special Requests   Final    BOTTLES DRAWN AEROBIC AND ANAEROBIC Blood Culture results may not be optimal due to an inadequate volume of blood received in culture bottles   Culture   Final    NO GROWTH 2 DAYS Performed at [facility], [place] [address] [place], [place] [address]    Report Status PENDING  Incomplete  Urine Culture     Status: None   Collection Time: 06/04/21  3:35 PM   Specimen: In/Out Cath Urine  Result Value  Ref Range Status   Specimen Description IN/OUT CATH URINE  Final   Special Requests NONE  Final   Culture   Final    NO GROWTH Performed at [facility], [address], [place], [place] [address]    Report Status 06/06/2021 FINAL  Final  Resp Panel by RT-PCR (Flu A&B, Covid) Nasopharyngeal Swab     Status: None   Collection Time: 06/04/21  9:35 PM   Specimen: Nasopharyngeal Swab; Nasopharyngeal(NP) swabs in vial transport medium  Result Value Ref Range Status   SARS Coronavirus 2 by RT PCR NEGATIVE NEGATIVE Final    Comment: (NOTE) SARS-CoV-2 target nucleic acids are NOT DETECTED.  The SARS-CoV-2 RNA is generally detectable in upper respiratory specimens during the acute phase of infection. The lowest concentration of SARS-CoV-2 viral copies this assay can detect is 138 copies/mL. A negative result does not preclude SARS-Cov-2 infection and should not be used as the sole basis for treatment or other patient management decisions. A negative result may occur with  improper specimen collection/handling, submission of specimen other than nasopharyngeal swab, presence of viral mutation(s) within the areas targeted by this assay, and inadequate number of viral copies(<138 copies/mL). A negative result must be combined with clinical observations, patient history, and epidemiological information.  The expected result is Negative.  Fact Sheet for Patients:  EntrepreneurPulse.com.au  Fact Sheet for Healthcare Providers:  IncredibleEmployment.be  This test is no t yet approved or cleared by the [place] FDA and  has been authorized for detection and/or diagnosis of SARS-CoV-2 by FDA under an Emergency Use Authorization (EUA). This EUA will remain  in effect (meaning this test can be used) for the duration of the COVID-19 declaration under Section 564(b)(1) of the Act, 21 U.S.C.section 360bbb-3(b)(1), unless the authorization is terminated  or revoked sooner.       Influenza A by PCR NEGATIVE NEGATIVE Final   Influenza B by PCR NEGATIVE NEGATIVE Final    Comment: (NOTE) The Xpert Xpress SARS-CoV-2/FLU/RSV plus assay is intended as an aid in the diagnosis of influenza from Nasopharyngeal swab specimens and should not be used as a sole basis for treatment. Nasal washings and aspirates are unacceptable for Xpert Xpress SARS-CoV-2/FLU/RSV testing.  Fact Sheet for Patients: EntrepreneurPulse.com.au  Fact Sheet for Healthcare Providers: IncredibleEmployment.be  This test is not yet approved or cleared by the [place] FDA and has been authorized for detection and/or diagnosis of SARS-CoV-2 by FDA under an Emergency Use Authorization (EUA). This EUA will remain in effect (meaning this test can be used) for the duration of the COVID-19 declaration under Section 564(b)(1) of the Act, 21 U.S.C. section 360bbb-3(b)(1), unless the authorization is terminated or revoked.  Performed at [facility], [place] [address] [place], [place] [address]   Blood Culture (routine x 2)     Status: None (Preliminary result)   Collection Time: 06/05/21  2:07 AM   Specimen: BLOOD RIGHT HAND  Result Value Ref Range Status   Specimen Description BLOOD RIGHT HAND  Final   Special Requests   Final    BOTTLES DRAWN  AEROBIC AND ANAEROBIC Blood Culture adequate volume   Culture   Final    NO GROWTH 1 DAY Performed at [facility], [place] [address] [place], [place] [address]    Report Status PENDING  Incomplete         Radiology Studies: US RENAL  Result Date: 06/04/2021 CLINICAL DATA:  Acute renal failure EXAM: RENAL /  URINARY TRACT ULTRASOUND COMPLETE COMPARISON:  None. FINDINGS: Right Kidney: Renal measurements: 11.6 x 5.8 x 6.5 cm = volume: 226 mL. Echogenicity within normal limits. No mass or hydronephrosis visualized. Left Kidney: Renal measurements: 10.6 x 5.8 x 5.1 cm = volume: 163 mL. Echogenicity within normal limits. No mass or hydronephrosis visualized. Bladder: Appears normal for degree of bladder distention. Other: None. IMPRESSION: Unremarkable renal ultrasound. Electronically Signed   By: Rolm Baptise M.D.   On: 06/04/2021 23:08   DG Chest Port 1 View  Result Date: 06/04/2021 CLINICAL DATA:  Questionable sepsis, EXAM: PORTABLE CHEST 1 VIEW COMPARISON:  March 13, 2021 FINDINGS: The heart size and mediastinal contours are within normal limits. Linear opacity in the left lung base, favor atelectasis. No pleural effusion. No pneumothorax. No acute osseous abnormality. Remote bilateral clavicle fractures. Remote left-sided rib fractures. IMPRESSION: Linear opacity in the left lung base, favor atelectasis. Electronically Signed   By: Dahlia Bailiff M.D.   On: 06/04/2021 15:56   US Abdomen Limited RUQ (LIVER/GB)  Result Date: 06/04/2021 CLINICAL DATA:  Jaundice EXAM: ULTRASOUND ABDOMEN LIMITED RIGHT UPPER QUADRANT COMPARISON:  Abdominal ultrasound 11/29/2020 FINDINGS: Gallbladder: There are small gallstones. No gallbladder wall thickening. No sonographic Murphy sign noted by sonographer. Common bile duct: Diameter: 0.4 cm, within normal limits Liver: No focal lesion identified. Parenchymal echogenicity is diffusely increased. Portal vein is patent on color Doppler imaging with normal direction of  blood flow towards the liver. Other: None. IMPRESSION: 1.  No acute finding sonographically in the abdomen. 2.  Cholelithiasis without evidence of acute cholecystitis. 3. Diffusely increased liver parenchymal echogenicity which is nonspecific but most commonly seen with hepatic steatosis. Electronically Signed   By: Audie Pinto M.D.   On: 06/04/2021 18:15        Scheduled Meds:  sodium chloride   Intravenous Once   folic acid  1 mg Oral Daily   gabapentin  100 mg Oral TID   midodrine  10 mg Oral Q8H   multivitamin with minerals  1 tablet Oral Daily   pantoprazole (PROTONIX) IV  40 mg Intravenous Q12H   sodium chloride flush  3 mL Intravenous Q12H   thiamine  100 mg Oral Daily   Or   thiamine  100 mg Intravenous Daily   Continuous Infusions:  albumin human     cefTRIAXone (ROCEPHIN)  IV Stopped (06/06/21 0925)   lactated ringers 100 mL/hr at 06/06/21 0017     LOS: 2 days    Time spent: 41 minutes spent on chart review, discussion with nursing staff, consultants, updating family and interview/physical exam; more than 50% of that time was spent in counseling and/or coordination of care.    Ronaldo J [place] DO [facility] Available via Epic secure chat 7am-7pm After these hours, please refer to coverage provider listed on [email or web address] 06/06/2021, 11:16 AM

## 2021-06-06 NOTE — Progress Notes (Signed)
Ambulated in hallway with patient two times approximately 240 ft each time.  Patient used RW and in no apparent issues.  Upon returning to room, pt wanted to sit on side of bed. Pt states he feels SOB. Oxygen saturations 95-100% room air.  Pt states he just feels weak from surgery.  Wife at bedside. Pt resting with call bell within reach.  Will continue to monitor.

## 2021-06-06 NOTE — Progress Notes (Signed)
CSW met with pt for substance use consult. Pt is difficult to arouse but is able to wake up. CSW attempts to discuss pt's substance use and treatment desires with him but he frequently falls asleep during conversation and is unable to completely answer questions. TOC will follow up when pt is more alert.

## 2021-06-06 NOTE — Plan of Care (Signed)

## 2021-06-06 NOTE — Progress Notes (Signed)
[facility] Progress Note    Since last GI note: He was sitting up in bed, reviewing the menu this morning.  Received 2 units PRBC yesterday and MS began to improve.  His labs have not been drawn this AM.  Cbc, cmet, inr.   RN reports brown stool twice in past 24 hours   Objective: Vital signs in last 24 hours: Temp:  [97.3 F (36.3 C)-98.5 F (36.9 C)] 97.3 F (36.3 C) (09/05 0334) Pulse Rate:  [69-89] 76 (09/05 0334) Resp:  [14-20] 20 (09/05 0334) BP: (84-96)/(58-68) 96/68 (09/05 0334) SpO2:  [96 %-100 %] 96 % (09/05 0334) Last BM Date:  (PTA) General: alert and oriented times 2 Heart: regular rate and rythm Abdomen: soft, non-tender, non-distended, normal bowel sounds   Lab Results: Recent Labs    06/04/21 1535 06/05/21 1008  WBC 9.4 7.4  HGB 10.3* 5.6*  PLT 131* 77*  MCV 100.3* 98.8   Recent Labs    06/04/21 1535 06/05/21 0214  NA 139 141  K 3.4* 3.0*  CL 90* 98  CO2 25 23  GLUCOSE 128* 117*  BUN 129* 121*  CREATININE 4.48* 3.69*  CALCIUM 9.2 8.2*   Recent Labs    06/04/21 1535 06/05/21 0214  PROT 10.0*  10.0* 6.8  ALBUMIN 2.8*  2.8* 2.8*  AST 157*  156* 85*  ALT 77*  75* 42  ALKPHOS 168*  157* 85  BILITOT 26.0*  26.0* 17.3*  BILIDIR 10.0* 9.7*  IBILI 16.0* 7.6*   Recent Labs    06/04/21 1535 06/05/21 0214  INR 1.1 1.3*    Medications: Scheduled Meds:  sodium chloride   Intravenous Once   folic acid  1 mg Oral Daily   midodrine  10 mg Oral Q8H   multivitamin with minerals  1 tablet Oral Daily   sodium chloride flush  3 mL Intravenous Q12H   thiamine  100 mg Oral Daily   Or   thiamine  100 mg Intravenous Daily   Continuous Infusions:  albumin human     cefTRIAXone (ROCEPHIN)  IV 2 g (06/05/21 0818)   lactated ringers 100 mL/hr at 06/06/21 0017   octreotide  (SANDOSTATIN)    IV infusion 50 mcg/hr (06/06/21 0208)   pantoprazole 8 mg/hr (06/06/21 0214)   PRN Meds:.LORazepam **OR** LORazepam, ondansetron  **OR** ondansetron (ZOFRAN) IV  Assessment/Plan: 55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  I explained how his family found him surrounded by empty beer, wine and whiskey bottles as well as full containers of his urine.  He's been consuming extreme etoh for a very long time.  I am astounded how much better he looks this morning and I suspect this is a combination of time since last etoh and 2 unit blood transfusion for admitting Hb 5s yesterday.  He's not had any overt bleeding since admission and in fact had a brown stool overnight.  I think it is safe to d/c his octreotide drip, change to BID PPI.  Should continue his empiric abx for now.  He will need endoscopic testing this admission. In patient team to see him tomorrow to determine timing, may be safe in next few days.   Cbc, cmet, inr still pending from this AM  OK to eat diet safe per speech eval yesterday.  Rachael Fee, MD  06/06/2021, 6:46 AM [facility] Pager [phone]

## 2021-06-06 NOTE — Progress Notes (Signed)
Physical Therapy Evaluation Patient Details Name: Dylan Weaver MRN: [id number] DOB: [birth year] Today's Date: 06/06/2021   History of Present Illness  Pt is a 55yo male presenting to [facility] ED on 9/3 with complaints of weakness and jaundice. Found to be in acute liver failure with hepatic coma (encephalopathy). Chest xray showed possible L atelectasis. PMH: alcohol abuse  Clinical Impression  Pt presents with the impairments above and problems listed below. Pt mod assist for bed mobility, max assist for transfer bed to chair x2, as nursing requesting to not leave pt in chair. HR and SpO2 monitored throughout session and were [facility]. Pt demonstrated generalized weakness throughout and demonstrated flat affect during session. Recommending SNF-level therapies upon discharge to address current impairments. We will continue to work with him acutely to promote independence with functional mobility.     Follow Up Recommendations SNF;Supervision/Assistance - 24 hour    Equipment Recommendations  Rolling walker with 5" wheels;3in1 (PT);Wheelchair cushion (measurements PT);Wheelchair (measurements PT)    Recommendations for Other Services       Precautions / Restrictions Precautions Precautions: Fall Restrictions Weight Bearing Restrictions: No      Mobility  Bed Mobility Overal bed mobility: Needs Assistance Bed Mobility: Supine to Sit;Sit to Supine     Supine to sit: Mod assist Sit to supine: Min assist   General bed mobility comments: Pt required mod assist to sit for trunk control and scooting hips EOB via bed pad. Pt min assist during sit to supine for LE assist. Pt able to scoot up in bed with supervision with min verbal cues,    Transfers Overall transfer level: Needs assistance Equipment used: Rolling walker (2 wheeled) Transfers: Sit to/from [employer] Sit to Stand: Mod assist;Max assist   Squat pivot transfers: Max assist;+2 safety/equipment      General transfer comment: Pt required mod to max assist for lift assist for sit to stand transfer using RW. Pt only able to stand for a few seconds, then had to sit. Pt required max assist for squat pivot transfer to chair and back to bed, with +2 assist for lines and leads.  Ambulation/Gait             General Gait Details: unable  Stairs            Wheelchair Mobility    Modified Rankin (Stroke Patients Only)       Balance Overall balance assessment: Needs assistance Sitting-balance support: Bilateral upper extremity supported;Feet supported Sitting balance-Leahy Scale: Poor Sitting balance - Comments: Pt reliant on BUE support while sitting EOB; after ~30s noted tremor of RUE.   Standing balance support: Bilateral upper extremity supported;During functional activity Standing balance-Leahy Scale: Poor Standing balance comment: Pt reliant on BUE on RW during static stance.                             Pertinent Vitals/Pain Pain Assessment: No/denies pain    Home Living Family/patient expects to be discharged to:: Private residence Living Arrangements: Alone Available Help at Discharge: Family;Available PRN/intermittently Type of Home: House Home Access: Stairs to enter Entrance Stairs-Rails: None Entrance Stairs-Number of Steps: 4 Home Layout: One level Home Equipment: Cane - single point      Prior Function Level of Independence: Independent         Comments: Pt reports independence with all mobility and ADLs     Hand Dominance        Extremity/Trunk Assessment  Upper Extremity Assessment Upper Extremity Assessment: Generalized weakness    Lower Extremity Assessment Lower Extremity Assessment: Generalized weakness;LLE deficits/detail LLE Deficits / Details: L wounds noted on L thigh    Cervical / Trunk Assessment Cervical / Trunk Assessment: Kyphotic  Communication   Communication: Other (comment) (mumbling when answering  questions.)  Cognition Arousal/Alertness: Lethargic;Awake/alert Behavior During Therapy: Flat affect Overall Cognitive Status: No family/caregiver present to determine baseline cognitive functioning                                 General Comments: Pt initially lethargic, however, improved alertness noted as session progressed. Slow to answer questions and required extended time for processing.      General Comments General comments (skin integrity, edema, etc.): Pt has abrasion on L knee, covered with scab. Pt reports it was prior to admission.    Exercises     Assessment/Plan    PT Assessment Patient needs continued PT services  PT Problem List Decreased strength;Decreased activity tolerance;Decreased balance;Decreased mobility;Decreased coordination;Decreased knowledge of use of DME;Decreased safety awareness;Decreased cognition       PT Treatment Interventions DME instruction;Gait training;Stair training;Functional mobility training;Therapeutic activities;Therapeutic exercise;Patient/family education;Cognitive remediation;Balance training    PT Goals (Current goals can be found in the Care Plan section)  Acute Rehab PT Goals Patient Stated Goal: To figure out what happened PT Goal Formulation: With patient Time For Goal Achievement: 06/20/21 Potential to Achieve Goals: Good    Frequency Min 2X/week   Barriers to discharge Decreased caregiver support      Co-evaluation               AM-PAC PT "6 Clicks" Mobility  Outcome Measure Help needed turning from your back to your side while in a flat bed without using bedrails?: A Little Help needed moving from lying on your back to sitting on the side of a flat bed without using bedrails?: A Lot Help needed moving to and from a bed to a chair (including a wheelchair)?: Total Help needed standing up from a chair using your arms (e.g., wheelchair or bedside chair)?: A Lot Help needed to walk in hospital  room?: Total Help needed climbing 3-5 steps with a railing? : Total 6 Click Score: 10    End of Session Equipment Utilized During Treatment: Gait belt Activity Tolerance: Patient limited by fatigue Patient left: in bed;with bed alarm set;with call bell/phone within reach (in chair position in bed per RN request) Nurse Communication: Mobility status;Other (comment) (Pt's bandage on L leg came off) PT Visit Diagnosis: Muscle weakness (generalized) (M62.81);Unsteadiness on feet (R26.81);Difficulty in walking, not elsewhere classified (R26.2)    Time: 4287-6811 PT Time Calculation (min) (ACUTE ONLY): 31 min   Charges:   PT Evaluation $PT Eval Moderate Complexity: 1 Mod PT Treatments $Therapeutic Activity: 8-22 mins        Johnn Hai, SPT Johnn Hai 06/06/2021, 1:47 PM

## 2021-06-06 NOTE — Progress Notes (Signed)
  Speech Language Pathology Treatment: Dysphagia  Patient Details Name: Dylan Weaver MRN: [id number] DOB: 08/02/1966 Today's Date: 06/06/2021 Time: 1251-1310 SLP Time Calculation (min) (ACUTE ONLY): 19 min  Assessment / Plan / Recommendation Clinical Impression  Followed up for diet tolerance and upgraded PO trials. Pt alert, cooperative at bedside. He appears to have improved mentation per chart review. Pt with some oral sensitivity (likely area in mouth from biting tongue, and lower lips with dried blood); provided gentle oral care. Assessed pt with regular, mixed consistency, and thin liquid snacks. Pt with adequate mastication and oral clearance though reported some odynophagia with mastication of solids due to oral sensitivity described above. Trace wet vocal quality noted x1, cues for throat clear and swallow cleared voice. Recommend diet advancement to dysphagia 3 (mechanical soft) and thin liquids with meds as tolerated. SLP to closely monitor.      HPI HPI: Pt is a 55 y.o. male who presented to the ED via EMS for evaluation of weakness and jaundice. CXR on admission: Linear opacity in the left lung base, favor atelectasis. Dx ARF, encephalopathy and acute liver failure thought to be 2/2 severe EtOH abuse. PMH: severe alcohol abuse.      SLP Plan  Continue with current plan of care       Recommendations  Diet recommendations: Dysphagia 3 (mechanical soft);Thin liquid Liquids provided via: Straw;Cup Medication Administration: Whole meds with liquid Supervision: Staff to assist with self feeding;Intermittent supervision to cue for compensatory strategies Compensations: Minimize environmental distractions;Slow rate;Small sips/bites;Clear throat intermittently Postural Changes and/or Swallow Maneuvers: Seated upright 90 degrees;Upright 30-60 min after meal                Oral Care Recommendations: Oral care BID Follow up Recommendations: Other (comment) (TBD) SLP  Visit Diagnosis: Dysphagia, unspecified (R13.10) Plan: Continue with current plan of care       GO                Maddox Bratcher H. MA, CCC-SLP Acute Rehabilitation Services   06/06/2021, 1:15 PM

## 2021-06-07 DIAGNOSIS — K7201 Acute and subacute hepatic failure with coma: Secondary | ICD-10-CM | POA: Diagnosis not present

## 2021-06-07 LAB — CBC
HCT: 26.4 % — ABNORMAL LOW (ref 39.0–52.0)
Hemoglobin: 8.9 g/dL — ABNORMAL LOW (ref 13.0–17.0)
MCH: 32.1 pg (ref 26.0–34.0)
MCHC: 33.7 g/dL (ref 30.0–36.0)
MCV: 95.3 fL (ref 80.0–100.0)
Platelets: 59 10*3/uL — ABNORMAL LOW (ref 150–400)
RBC: 2.77 MIL/uL — ABNORMAL LOW (ref 4.22–5.81)
RDW: 18.6 % — ABNORMAL HIGH (ref 11.5–15.5)
WBC: 7.8 10*3/uL (ref 4.0–10.5)
nRBC: 4.2 % — ABNORMAL HIGH (ref 0.0–0.2)

## 2021-06-07 LAB — COMPREHENSIVE METABOLIC PANEL
ALT: 56 U/L — ABNORMAL HIGH (ref 0–44)
AST: 125 U/L — ABNORMAL HIGH (ref 15–41)
Albumin: 2.8 g/dL — ABNORMAL LOW (ref 3.5–5.0)
Alkaline Phosphatase: 104 U/L (ref 38–126)
Anion gap: 10 (ref 5–15)
BUN: 47 mg/dL — ABNORMAL HIGH (ref 6–20)
CO2: 28 mmol/L (ref 22–32)
Calcium: 9.3 mg/dL (ref 8.9–10.3)
Chloride: 104 mmol/L (ref 98–111)
Creatinine, Ser: 1.38 mg/dL — ABNORMAL HIGH (ref 0.61–1.24)
GFR, Estimated: >60 mL/min (ref >60)
Glucose, Bld: 109 mg/dL — ABNORMAL HIGH (ref 70–99)
Potassium: 3 mmol/L — ABNORMAL LOW (ref 3.5–5.1)
Sodium: 142 mmol/L (ref 135–145)
Total Bilirubin: 18.2 mg/dL (ref 0.3–1.2)
Total Protein: 6.3 g/dL — ABNORMAL LOW (ref 6.5–8.1)

## 2021-06-07 LAB — PROTIME-INR
INR: 1.2 (ref 0.8–1.2)
Prothrombin Time: 14.9 seconds (ref 11.4–15.2)

## 2021-06-07 LAB — MAGNESIUM: Magnesium: 1.8 mg/dL (ref 1.7–2.4)

## 2021-06-07 MED ORDER — PEG-KCL-NACL-NASULF-NA ASC-C 100 G PO SOLR
0.5000 | Freq: Once | ORAL | Status: AC
  Administered 2021-06-08: 100 g via ORAL
  Filled 2021-06-07: qty 1

## 2021-06-07 MED ORDER — PEG-KCL-NACL-NASULF-NA ASC-C 100 G PO SOLR: 1.0000 | Freq: Once | ORAL | Status: DC

## 2021-06-07 MED ORDER — ENSURE ENLIVE PO LIQD
237.0000 mL | Freq: Two times a day (BID) | ORAL | Status: DC
  Administered 2021-06-11 – 2021-06-13 (×3): 237 mL via ORAL

## 2021-06-07 MED ORDER — BOOST / RESOURCE BREEZE PO LIQD CUSTOM
1.0000 | Freq: Three times a day (TID) | ORAL | Status: DC
  Administered 2021-06-07 – 2021-06-13 (×12): 1 via ORAL

## 2021-06-07 MED ORDER — PANTOPRAZOLE SODIUM 40 MG PO TBEC
40.0000 mg | DELAYED_RELEASE_TABLET | Freq: Two times a day (BID) | ORAL | Status: DC
  Administered 2021-06-07 – 2021-06-13 (×12): 40 mg via ORAL
  Filled 2021-06-07 (×11): qty 1

## 2021-06-07 MED ORDER — MAGNESIUM OXIDE -MG SUPPLEMENT 400 (240 MG) MG PO TABS
400.0000 mg | ORAL_TABLET | Freq: Two times a day (BID) | ORAL | Status: AC
  Administered 2021-06-07 (×2): 400 mg via ORAL
  Filled 2021-06-07 (×2): qty 1

## 2021-06-07 MED ORDER — POTASSIUM CHLORIDE CRYS ER 20 MEQ PO TBCR
30.0000 meq | EXTENDED_RELEASE_TABLET | ORAL | Status: AC
  Administered 2021-06-07 (×3): 30 meq via ORAL
  Filled 2021-06-07 (×3): qty 1

## 2021-06-07 MED ORDER — PEG-KCL-NACL-NASULF-NA ASC-C 100 G PO SOLR
0.5000 | Freq: Once | ORAL | Status: AC
  Administered 2021-06-07: 100 g via ORAL
  Filled 2021-06-07: qty 1

## 2021-06-07 MED ORDER — BISACODYL 5 MG PO TBEC
20.0000 mg | DELAYED_RELEASE_TABLET | Freq: Once | ORAL | Status: AC
  Administered 2021-06-07: 20 mg via ORAL
  Filled 2021-06-07: qty 4

## 2021-06-07 NOTE — Evaluation (Signed)
Occupational Therapy Evaluation Patient Details Name: Dylan Weaver MRN: [id number] DOB: 08-25-1966 Today's Date: 06/07/2021    History of Present Illness Pt is a 55yo male presenting to [facility] ED on 9/3 with complaints of weakness and jaundice. Found to be in acute liver failure with hepatic coma (encephalopathy). Chest xray showed possible L atelectasis. PMH: alcohol abuse   Clinical Impression   PTA, pt lives alone and reports Independence with ADLs, IADLs and mobility without use of AD. Pt presents now with diagnoses above and deficits in strength, cognition, standing balance and endurance. Pt requires extended time for processing and sequencing tasks. Pt overall Min A for bed mobility, initiated multiple sit to stand attempts at bedside though unable to successfully complete. When OT attempted to modify task for success, pt reported fatigue and returned self to bed. Pt requires Supervision for UB ADLs and Max A for LB ADLs. Recommend SNF rehab at DC to maximize safety and independence.     Follow Up Recommendations  SNF;Supervision/Assistance - 24 hour    Equipment Recommendations  Other (comment) (Rolling walker)    Recommendations for Other Services       Precautions / Restrictions Precautions Precautions: Fall Restrictions Weight Bearing Restrictions: No      Mobility Bed Mobility Overal bed mobility: Needs Assistance Bed Mobility: Supine to Sit;Sit to Supine     Supine to sit: Min assist;HOB elevated Sit to supine: Min assist   General bed mobility comments: Min A to lift trunk to EOB, Min A to return B LE back into bed    Transfers Overall transfer level: Needs assistance               General transfer comment: unable today - citing fatigue after initial attempts    Balance Overall balance assessment: Needs assistance Sitting-balance support: Bilateral upper extremity supported;Feet supported Sitting balance-Leahy Scale: Fair Sitting balance -  Comments: fair static sitting EOB                                   ADL either performed or assessed with clinical judgement   ADL Overall ADL's : Needs assistance/impaired Eating/Feeding: Independent   Grooming: Set up;Sitting;Wash/dry face Grooming Details (indicate cue type and reason): noted with cracked, bleeding lips - RN aware Upper Body Bathing: Supervision/ safety;Sitting   Lower Body Bathing: Maximal assistance;Sit to/from stand   Upper Body Dressing : Supervision/safety;Sitting   Lower Body Dressing: Maximal assistance;Sit to/from stand       Toileting- Architect and Hygiene: Maximal assistance;Sit to/from stand         General ADL Comments: Limited by cognitive deficits and slower processing during ADL tasks. Attempted standing at bedside with difficulty problem solving and then pt reporting fatigue - returning self to bed     Vision Ability to See in Adequate Light: 0 Adequate Patient Visual Report: No change from baseline Vision Assessment?: No apparent visual deficits     Perception     Praxis      Pertinent Vitals/Pain Pain Assessment: Faces Faces Pain Scale: Hurts a little bit Pain Location: neck Pain Descriptors / Indicators: Other (Comment) (stiff) Pain Intervention(s): Monitored during session;Limited activity within patient's tolerance     Hand Dominance Right   Extremity/Trunk Assessment Upper Extremity Assessment Upper Extremity Assessment: Generalized weakness   Lower Extremity Assessment Lower Extremity Assessment: Defer to PT evaluation   Cervical / Trunk Assessment Cervical / Trunk Assessment:  Kyphotic;Other exceptions Cervical / Trunk Exceptions: neck stiffness   Communication Communication Communication: No difficulties   Cognition Arousal/Alertness: Lethargic;Awake/alert Behavior During Therapy: Flat affect Overall Cognitive Status: No family/caregiver present to determine baseline cognitive  functioning                                 General Comments: Pt initially lethargic, however, improved alertness noted as session progressed. Slow to answer questions and required extended time for processing. Difficulty problem solving, delayed responses to orientation questions, difficulty sequencing   General Comments  VSS on RA    Exercises     Shoulder Instructions      Home Living Family/patient expects to be discharged to:: Private residence Living Arrangements: Alone Available Help at Discharge: Family;Available PRN/intermittently Type of Home: House Home Access: Stairs to enter [employer] of Steps: 4 Entrance Stairs-Rails: None Home Layout: One level     Bathroom Shower/Tub: Chief Strategy Officer: Standard     Home Equipment: Cane - single point          Prior Functioning/Environment Level of Independence: Independent        Comments: Pt reports independence with all mobility and ADLs. Pt reports driving and grocery shopping. Does endorse becoming fatigue with daily tasks        OT Problem List: Decreased strength;Decreased activity tolerance;Impaired balance (sitting and/or standing);Decreased cognition;Decreased safety awareness      OT Treatment/Interventions: Therapeutic exercise;Self-care/ADL training;DME and/or AE instruction;Therapeutic activities;Patient/family education;Balance training    OT Goals(Current goals can be found in the care plan section) Acute Rehab OT Goals Patient Stated Goal: lay back down OT Goal Formulation: With patient Time For Goal Achievement: 06/21/21 Potential to Achieve Goals: Good  OT Frequency: Min 2X/week   Barriers to D/C:            Co-evaluation              AM-PAC OT "6 Clicks" Daily Activity     Outcome Measure Help from another person eating meals?: None Help from another person taking care of personal grooming?: A Little Help from another person toileting,  which includes using toliet, bedpan, or urinal?: A Lot Help from another person bathing (including washing, rinsing, drying)?: A Lot Help from another person to put on and taking off regular upper body clothing?: A Little Help from another person to put on and taking off regular lower body clothing?: A Lot 6 Click Score: 16   End of Session Equipment Utilized During Treatment: Gait belt Nurse Communication: Mobility status  Activity Tolerance: Patient limited by fatigue Patient left: in bed;with call bell/phone within reach;with bed alarm set  OT Visit Diagnosis: Unsteadiness on feet (R26.81);Other abnormalities of gait and mobility (R26.89);Muscle weakness (generalized) (M62.81);Other symptoms and signs involving cognitive function                Time: 0700-0720 OT Time Calculation (min): 20 min Charges:  OT General Charges $OT Visit: 1 Visit OT Evaluation $OT Eval Moderate Complexity: 1 Mod  Bradd Canary, OTR/L Acute Rehab Services Office: [phone]   Lorre Munroe 06/07/2021, 7:34 AM

## 2021-06-07 NOTE — Progress Notes (Addendum)
        Daily Rounding Note  06/07/2021, 11:47 AM  LOS: 3 days   SUBJECTIVE:   Chief complaint:   ETOH hepatitis, fatty liver, anemia.   No complaints.  Has not been out of bed.  Bowel movement yesterday.  No nausea or vomiting.  OBJECTIVE:         Vital signs in last 24 hours:    Temp:  [97.5 F (36.4 C)-98 F (36.7 C)] 97.8 F (36.6 C) (09/06 1100) Pulse Rate:  [68-75] 75 (09/06 1100) Resp:  [16-19] 16 (09/06 1100) BP: (90-113)/(66-83) 94/68 (09/06 1100) SpO2:  [91 %-97 %] 97 % (09/06 1100) Last BM Date: 06/05/21 (per report) Filed Weights   06/04/21 1600  Weight: 81.6 kg   General: Jaundiced.  Looks unwell.  Comfortable. Heart: RRR. Chest: No labored breathing.  No cough.  Decreased breath sounds but no adventitious sounds bilaterally. Abdomen: Soft without tenderness.  No distention.  No HSM, masses, bruits, hernias Extremities: + Edema. Neuro/Psych: Oriented x3.  Response time slow/psychomotor slowing but is appropriate in his answers and speech is fluid.  No asterixis.  Intake/Output from previous day: 09/05 0701 - 09/06 0700 In: 2954.1 [P.O.:720; I.V.:2005; IV Piggyback:229.1] Out: 200 [Urine:200]  Intake/Output this shift: Total I/O In: 273 [P.O.:270; I.V.:3] Out: 500 [Urine:500]  Lab Results: Recent Labs    06/05/21 1008 06/06/21 0811 06/07/21 0044  WBC 7.4 7.1 7.8  HGB 5.6* 8.8* 8.9*  HCT 16.4* 26.3* 26.4*  PLT 77* 62* 59*   BMET Recent Labs    06/05/21 0214 06/06/21 0811 06/07/21 0044  NA 141 146* 142  K 3.0* 3.0* 3.0*  CL 98 109 104  CO2 23 28 28  GLUCOSE 117* 119* 109*  BUN 121* 72* 47*  CREATININE 3.69* 1.91* 1.38*  CALCIUM 8.2* 9.2 9.3   LFT Recent Labs    06/04/21 1535 06/05/21 0214 06/06/21 0811 06/07/21 0044  PROT 10.0*  10.0* 6.8 6.4* 6.3*  ALBUMIN 2.8*  2.8* 2.8* 2.8* 2.8*  AST 157*  156* 85* 107* 125*  ALT 77*  75* 42 46* 56*  ALKPHOS 168*  157* 85 90 104   BILITOT 26.0*  26.0* 17.3* 19.5* 18.2*  BILIDIR 10.0* 9.7*  --   --   IBILI 16.0* 7.6*  --   --    PT/INR Recent Labs    06/06/21 0811 06/07/21 0044  LABPROT 15.6* 14.9  INR 1.2 1.2   Hepatitis Panel Recent Labs    06/04/21 1900  HEPBSAG NON REACTIVE  HCVAB NON REACTIVE  HEPAIGM NON REACTIVE  HEPBIGM NON REACTIVE    Studies/Results: No results found.  Scheduled Meds:  sodium chloride   Intravenous Once   bisacodyl  20 mg Oral Once   feeding supplement  1 Container Oral TID BM   feeding supplement  237 mL Oral BID BM   folic acid  1 mg Oral Daily   gabapentin  100 mg Oral TID   magnesium oxide  400 mg Oral BID   midodrine  10 mg Oral Q8H   multivitamin with minerals  1 tablet Oral Daily   pantoprazole  40 mg Oral BID   peg 3350 powder  1 kit Oral Once   potassium chloride  30 mEq Oral Q3H   sodium chloride flush  3 mL Intravenous Q12H   thiamine  100 mg Oral Daily   Or   thiamine  100 mg Intravenous Daily   Continuous Infusions:  albumin human 25 g (  06/06/21 2115)   cefTRIAXone (ROCEPHIN)  IV 2 g (06/07/21 0816)   PRN Meds:.LORazepam **OR** LORazepam, ondansetron **OR** ondansetron (ZOFRAN) IV   ASSESMENT:   Alcoholic liver disease with jaundice, fatty liver.  DF score 26 >> 17.  Negative viral serologies.  LFTs improving.     Anemia.  Hgb 5.6 >> 2 PRBCs >> 8.9.  Iron elevated at 204.  Iron saturation elevated at 62.  Ferritin 84.  Low folate at 4.3.  B12 normal.  Blood observed on oral exam along with trauma to the tongue attributed to him biting his tongue.  Folic acid now in place.       Thrombocytopenia.  Platelets 131 >> 59     AKI.  Initially some concern for HRS but no indication patient actually has cirrhosis.  Labs improved.  GFR 15, now > 60.     Hypokalemia, persists despite supplementation.  Additional oral potassium ordered today.  Significant social/emotional issues including fathers death, then divorce, and recent death of his  mother.   PLAN   Colonoscopy and upper endoscopy set for 2:45 PM tomorrow.  Patient agreeable.  See orders for split dose bowel prep with second portion early tomorrow morning.    Sarah Gribbin  06/07/2021, 11:47 AM Phone [phone]    Attending physician's note   I have taken an interval history, reviewed the chart and examined the patient. I agree with the Advanced Practitioner's note, impression and recommendations.   Acute ETOH hepatitis with underlying fatty liver. No cirrhosis. Severe anemia on adm. Hb 5.6 s/p 2U to 8.9. No overt bleeding. Plts 59K. Nl INR  Plan: -Recheck CBC esp plts, CMP in a.m. -EGD/colon in AM with Dr Dorsey.  -Strictly no alcohol.  Discussed in detail. -FU with Dr. Jacobs as outpt.   I discussed EGD/Colonoscopy- the indications, risks, alternatives and potential complications including, but not limited to, bleeding, infection, reaction to medication, damage to internal organs, cardiac and/or pulmonary problems, and perforation requiring surgery.The possibility that significant findings could be missed was explained. All ? were answered. The patient gives consent to proceed.  He does understand that he would have high risk of bleeding especially due to low platelet count.    Raj Ericha Whittingham, MD [facility] [phone]  

## 2021-06-07 NOTE — H&P (View-Only) (Signed)
Daily Rounding Note  06/07/2021, 11:47 AM  LOS: 3 days   SUBJECTIVE:   Chief complaint:   ETOH hepatitis, fatty liver, anemia.   No complaints.  Has not been out of bed.  Bowel movement yesterday.  No nausea or vomiting.  OBJECTIVE:         Vital signs in last 24 hours:    Temp:  [97.5 F (36.4 C)-98 F (36.7 C)] 97.8 F (36.6 C) (09/06 1100) Pulse Rate:  [68-75] 75 (09/06 1100) Resp:  [16-19] 16 (09/06 1100) BP: (90-113)/(66-83) 94/68 (09/06 1100) SpO2:  [91 %-97 %] 97 % (09/06 1100) Last BM Date: 06/05/21 (per report) Filed Weights   06/04/21 1600  Weight: 81.6 kg   General: Jaundiced.  Looks unwell.  Comfortable. Heart: RRR. Chest: No labored breathing.  No cough.  Decreased breath sounds but no adventitious sounds bilaterally. Abdomen: Soft without tenderness.  No distention.  No HSM, masses, bruits, hernias Extremities: + Edema. Neuro/Psych: Oriented x3.  Response time slow/psychomotor slowing but is appropriate in his answers and speech is fluid.  No asterixis.  Intake/Output from previous day: 09/05 0701 - 09/06 0700 In: 2954.1 [P.O.:720; I.V.:2005; IV Piggyback:229.1] Out: 200 [Urine:200]  Intake/Output this shift: Total I/O In: 273 [P.O.:270; I.V.:3] Out: 500 [Urine:500]  Lab Results: Recent Labs    06/05/21 1008 06/06/21 0811 06/07/21 0044  WBC 7.4 7.1 7.8  HGB 5.6* 8.8* 8.9*  HCT 16.4* 26.3* 26.4*  PLT 77* 62* 59*   BMET Recent Labs    06/05/21 0214 06/06/21 0811 06/07/21 0044  NA 141 146* 142  K 3.0* 3.0* 3.0*  CL 98 109 104  CO2 _0 GLUCOSE 117* 119* 109*  BUN 121* 72* 47*  CREATININE 3.69* 1.91* 1.38*  CALCIUM 8.2* 9.2 9.3   LFT Recent Labs    06/04/21 1535 06/05/21 0214 06/06/21 0811 06/07/21 0044  PROT 10.0*  10.0* 6.8 6.4* 6.3*  ALBUMIN 2.8*  2.8* 2.8* 2.8* 2.8*  AST 157*  156* 85* 107* 125*  ALT 77*  75* 42 46* 56*  ALKPHOS 168*  157* 85 90 104   BILITOT 26.0*  26.0* 17.3* 19.5* 18.2*  BILIDIR 10.0* 9.7*  --   --   IBILI 16.0* 7.6*  --   --    PT/INR Recent Labs    06/06/21 0811 06/07/21 0044  LABPROT 15.6* 14.9  INR 1.2 1.2   Hepatitis Panel Recent Labs    06/04/21 1900  HEPBSAG NON REACTIVE  HCVAB NON REACTIVE  HEPAIGM NON REACTIVE  HEPBIGM NON REACTIVE    Studies/Results: No results found.  Scheduled Meds:  sodium chloride   Intravenous Once   bisacodyl  20 mg Oral Once   feeding supplement  1 Container Oral TID BM   feeding supplement  237 mL Oral BID BM   folic acid  1 mg Oral Daily   gabapentin  100 mg Oral TID   magnesium oxide  400 mg Oral BID   midodrine  10 mg Oral Q8H   multivitamin with minerals  1 tablet Oral Daily   pantoprazole  40 mg Oral BID   peg 3350 powder  1 kit Oral Once   potassium chloride  30 mEq Oral Q3H   sodium chloride flush  3 mL Intravenous Q12H   thiamine  100 mg Oral Daily   Or   thiamine  100 mg Intravenous Daily   Continuous Infusions:  albumin human 25 g (  06/06/21 2115)   cefTRIAXone (ROCEPHIN)  IV 2 g (06/07/21 0816)   PRN Meds:.LORazepam **OR** LORazepam, ondansetron **OR** ondansetron (ZOFRAN) IV   ASSESMENT:   Alcoholic liver disease with jaundice, fatty liver.  DF score 26 >> 17.  Negative viral serologies.  LFTs improving.     Anemia.  Hgb 5.6 >> 2 PRBCs >> 8.9.  Iron elevated at 204.  Iron saturation elevated at 62.  Ferritin 84.  Low folate at 4.3.  B12 normal.  Blood observed on oral exam along with trauma to the tongue attributed to him biting his tongue.  Folic acid now in place.       Thrombocytopenia.  Platelets 131 >> 59     AKI.  Initially some concern for HRS but no indication patient actually has cirrhosis.  Labs improved.  GFR 15, now > 60.     Hypokalemia, persists despite supplementation.  Additional oral potassium ordered today.  Significant social/emotional issues including fathers death, then divorce, and recent death of his  mother.   PLAN   Colonoscopy and upper endoscopy set for 2:45 PM tomorrow.  Patient agreeable.  See orders for split dose bowel prep with second portion early tomorrow morning.    Azucena Freed  06/07/2021, 11:47 AM Phone [phone]    Attending physician's note   I have taken an interval history, reviewed the chart and examined the patient. I agree with the Advanced Practitioner's note, impression and recommendations.   Acute ETOH hepatitis with underlying fatty liver. No cirrhosis. Severe anemia on adm. Hb 5.6 s/p 2U to 8.9. No overt bleeding. Plts 59K. Nl INR  Plan: -Recheck CBC esp plts, CMP in a.m. -EGD/colon in AM with Dr Lorenso Courier.  -Strictly no alcohol.  Discussed in detail. -FU with Dr. Ardis Hughs as outpt.   I discussed EGD/Colonoscopy- the indications, risks, alternatives and potential complications including, but not limited to, bleeding, infection, reaction to medication, damage to internal organs, cardiac and/or pulmonary problems, and perforation requiring surgery.The possibility that significant findings could be missed was explained. All ? were answered. The patient gives consent to proceed.  He does understand that he would have high risk of bleeding especially due to low platelet count.    Carmell Austria, MD [facility] [phone]

## 2021-06-07 NOTE — Progress Notes (Signed)
Initial Nutrition Assessment  DOCUMENTATION CODES:  Non-severe (moderate) malnutrition in context of chronic illness  INTERVENTION:  Obtain updated weight.  Add Boost Breeze po TID, each supplement provides 250 kcal and 9 grams of protein.  Once diet is advanced, add: Ensure Enlive po BID, each supplement provides 350 kcal and 20 grams of protein. Magic cup TID with meals, each supplement provides 290 kcal and 9 grams of protein.  Continue CIWA protocol.  NUTRITION DIAGNOSIS:  Moderate Malnutrition related to chronic illness (EtOH abuse/addiction) as evidenced by mild fat depletion, moderate fat depletion, mild muscle depletion, moderate muscle depletion.  GOAL:  Patient will meet greater than or equal to 90% of their needs  MONITOR:  PO intake, Supplement acceptance, Labs, Weight trends, Skin, I & O's  REASON FOR ASSESSMENT:  Consult Assessment of nutrition requirement/status, Poor PO  ASSESSMENT:  55 yo male with a PMH of EtOH abuse who presents with acute liver failure with hepatic coma (encephalopathy). Came in with weakness and jaundice.  Per Epic, pt ate 5% of breakfast, 10% of lunch, and 30% of dinner yesterday. Pt ate 80% of breakfast this morning. Spoke with pt at bedside and he reports that he has been eating well at home with no recent changes in his PO intake or appetite. RD suspects PO intake my not be as good as pt lets on.  Pt unsure of any weight changes. Per Epic, weight appears to be stable and increased from last admission in June, but it may be copied due to exactness. RD to order new measured weight.  On exam, pt with mild to moderate depletions throughout.  Recommend adding Ensure BID and Magic Cup TID once diet advances, as well as continuing CIWA protocol. While on CLD, recommend adding BB TID.  Medications: reviewed; folic acid, Mag-Ox BID, midodrine, MVI with minerals, Protonix, Klor-Con 30 mEq, thimaine  Labs: reviewed; K 3 (L), Glucose 109  (H)  NUTRITION - FOCUSED PHYSICAL EXAM: Flowsheet Row Most Recent Value  Orbital Region Moderate depletion  Upper Arm Region Mild depletion  Thoracic and Lumbar Region No depletion  Buccal Region Mild depletion  Temple Region Moderate depletion  Clavicle Bone Region Mild depletion  Clavicle and Acromion Bone Region Mild depletion  Scapular Bone Region Moderate depletion  Dorsal Hand Moderate depletion  Patellar Region Severe depletion  Anterior Thigh Region Moderate depletion  Posterior Calf Region Moderate depletion  Edema (RD Assessment) None  Hair Reviewed  Eyes Reviewed  Mouth Reviewed  Skin Reviewed  Nails Reviewed   Diet Order:   Diet Order             Diet NPO time specified  Diet effective 1000           Diet clear liquid Room service appropriate? Yes; Fluid consistency: Thin  Diet effective now                  EDUCATION NEEDS:  Education needs have been addressed  Skin:  Skin Assessment: Skin Integrity Issues: Skin Integrity Issues:: Other (Comment) Other: Several skin tears, ulcer on back, and wound on groin. Skin jaundice in color  Last BM:  06/06/21 - Type 6, large  Height:  Ht Readings from Last 1 Encounters:  03/14/21 6' (1.829 m)   Weight:  Wt Readings from Last 1 Encounters:  06/04/21 81.6 kg   BMI:  Body mass index is 24.4 kg/m.  Estimated Nutritional Needs:  Kcal:  2200-2400 Protein:  100-115 grams Fluid:  >2.2 L  Derrel Nip, RD, LDN (she/her/hers) Registered Dietitian I After-Hours/Weekend Pager # in [place]

## 2021-06-07 NOTE — Progress Notes (Signed)
PROGRESS NOTE    Dylan Weaver  RKY:[id number] DOB: [birth year] DOA: 06/04/2021 PCP: [facility]    Brief Narrative:  Dylan Weaver is a 55 y.o. 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  History is limited from patient due to encephalopathy and is otherwise supplemented by EDP and chart review. Patient was last seen by family about a week ago.  He lives alone.  I did not hear from him and went to check in on him today.  He appeared newly jaundiced and ill.  EMS were called and he was brought to the ED for further evaluation.  Patient states that he has just been feeling sluggish.  He denies any vomiting.  When asked if he bit his tongue he says yes.  He is otherwise not answering further questions to assess for last alcohol use, history of seizures, pain, etc.   In the ED, BP 72/62, pulse 105, RR 21, temp 97.6 F, SPO2 97% on room air. Total bilirubin 26.0, AST 156, ALT 75, alk phos 157, BUN 129, creatinine 4.48 (previously 0.70 on 03/13/2021), sodium 139, potassium 3.4, bicarb 25, serum glucose 128, lactic acid 4.5, WBC 9.4, hemoglobin 10.3, platelets 131,000, INR 1.1, ammonia 31.  Acute hepatitis panel, acetaminophen levels obtained and pending.  Blood cultures collected and pending.  SARS-CoV-2 PCR panel collected and pending.   Portable chest x-ray shows linear opacity in the left lung base favoring atelectasis and remote left-sided rib fractures and bilateral clavicle fractures.  RUQ ultrasound was negative for acute findings.  Cholelithiasis without evidence of acute cholecystitis noted.  Nonspecific increased liver parenchymal echogenicity seen.   Patient was given 2.25 L LR followed by maintenance fluids.  He was also given IV Protonix 80 mg bolus followed by continuous infusion, IV octreotide 50 mcg, IV  cefepime.  EDP discussed with on-call GI who felt this was acute liver failure related to alcohol use and will see in consulation.  Hospitalist service was consulted for admission to further evaluate and manage acute liver failure in the setting of severe EtOH abuse, hypotension, hyperbilirubinemia, and acute renal failure.   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  Patient severely jaundiced and encephalopathic on admission.  He was hypotensive and tachycardic on arrival with question of upper GI bleed therefore started on aggressive therapy with IV fluid resuscitation, antibiotics, PPI, octreotide.  RUQ ultrasound without acute findings.  Cholelithiasis without acute cholecystitis and hepatic steatosis changes noted.  Acute hepatitis panel negative. --[facility] following; appreciate assistance --MELD-Na on admission>>33; correlating to 65-66% 90-day mortality --MDF on admission = 7.6; would not benefit from steroids --AST 156>85>107>125 --ALT 75>42>46>56 --Tbili 26>17.3>19.5>18.2 --Octreotide discontinued by GI on 9/6 --Continue Protonix 15m IV q12h --Ceftriaxone 2 g IV daily x 7 days --continue Albumin IV daily --Continue to monitor LFTs, PT/INR daily   Acute renal failure: Last creatinine 0.70 on 03/13/2021.  BUN 129 with creatinine 4.48 on admission.  Concern for possible hepatorenal syndrome.  Renal ultrasound unremarkable with no hydronephrosis. --Cr 4.48>3.69>1.91>1.38 --Continue Midodrine 143mPO TID --Continue IV  albumin --strict I/O's --Monitor renal function daily   Upper GI bleed: Anemia of chronic disease, folic acid deficiency Appearance of dried blood on lips and oral  mucosa.  Looks to have bitten his tongue.  Anemia panel with iron 204, TIBC 329, ferritin 84, folic acid low at 4.3.  S/p 2u pRBC 9/4. --Hemoglobin 10.3>5.6>8.8>8.9 --Continue IV Protonix, ceftriaxone as above. --Folic acid daily --GI plans EGD/colonoscopy 9/7 --CBC in the a.m.   Alcohol use disorder: High risk for severe withdrawal.  Time of last drink unknown.  Patient's second cousin, Olivia Mackie reports chronic alcoholism and has now switched to hard liquor drinking Shearon Stalls.  Apparently his life has spiraled downwards since the death of his father followed by divorce from his wife and now recent passing of his mother. --CIWA protocol with IV Ativan as needed. --TOC following for substance abuse  Hypokalemia Hypomagnesemia Potassium 3.0 and Magnesium 1.8 this morning, will replete. --Follow electrolytes daily   Thrombocytopenia: Plts 67 this am --CBC daily  Weakness/debility/deconditioning: --PT/OT recommend SNF --TOC for placement   DVT prophylaxis: SCDs Start: 06/04/21 1953   Code Status: Full Code Family Communication: No family present at bedside this morning; updated patient's aunt at bedside on 9/6  Disposition Plan:  Level of care: Progressive Status is: Inpatient  Remains inpatient appropriate because:Hemodynamically unstable, Altered mental status, Ongoing diagnostic testing needed not appropriate for outpatient work up, Unsafe d/c plan, IV treatments appropriate due to intensity of illness or inability to take PO, and Inpatient level of care appropriate due to severity of illness  Dispo: The patient is from: Home              Anticipated d/c is to: SNF              Patient currently is not medically stable to d/c.   Difficult to place patient No  Consultants:  [facility]  Procedures:  none  Antimicrobials:  Ceftriaxone 9/4>> Cefepime 9/3-9/3   Subjective: Patient seen examined at bedside, resting comfortably.  Watching TV.  No complaints.  Bilirubin  continues to improve.  Increasing oral intake.  Blood pressure also improved.  Seen by GI this morning with plans for EGD/colonoscopy tomorrow.  No family present this morning, updated patient's aunt yesterday at bedside. Patient denies headache, no chest pain, no palpitations, no shortness of breath, no abdominal pain.  No acute events overnight per nursing staff.  Objective: Vitals:   06/07/21 0407 06/07/21 0755 06/07/21 0945 06/07/21 1100  BP: 113/77 90/66 92/67  94/68  Pulse: 69 68 74 75  Resp: 17 18 16 16   Temp: 98 F (36.7 C) 97.9 F (36.6 C) 97.9 F (36.6 C) 97.8 F (36.6 C)  TempSrc: Axillary Oral Oral Oral  SpO2: 91% 97% 93% 97%  Weight:        Intake/Output Summary (Last 24 hours) at 06/07/2021 1301 Last data filed at 06/07/2021 1200 Gross per 24 hour  Intake 2987.06 ml  Output 700 ml  Net 2287.06 ml   Filed Weights   06/04/21 1600  Weight: 81.6 kg    Examination:  General exam: Appears calm and comfortable, chronically ill/cachectic in appearance and appears older than stated age HEENT: Noted scleral icterus Respiratory system: Clear to auscultation. Respiratory effort normal.  On room air Cardiovascular system: S1 & S2 heard, RRR. No JVD, murmurs, rubs, gallops or clicks. No pedal edema. Gastrointestinal system: Abdomen is nondistended, soft and nontender. No organomegaly or masses felt. Normal bowel sounds heard. Central nervous system: Alert but  with episodes of somnolence. No focal neurological deficits. Extremities: Moves all extremities independently Skin: Diffuse jaundice, pressure lesions left buttock and left hip  Psychiatry: Judgement and insight appear poor.       Data Reviewed: I have personally reviewed following labs and imaging studies  CBC: Recent Labs  Lab 06/04/21 1535 06/05/21 1008 06/06/21 0811 06/07/21 0044  WBC 9.4 7.4 7.1 7.8  NEUTROABS 7.6  --   --   --   HGB 10.3* 5.6* 8.8* 8.9*  HCT 31.5* 16.4* 26.3* 26.4*  MCV 100.3* 98.8  94.6 95.3  PLT 131* 77* 62* 59*   Basic Metabolic Panel: Recent Labs  Lab 06/04/21 1535 06/05/21 0214 06/06/21 0811 06/07/21 0044  NA 139 141 146* 142  K 3.4* 3.0* 3.0* 3.0*  CL 90* 98 109 104  CO2 25 23 28 28   GLUCOSE 128* 117* 119* 109*  BUN 129* 121* 72* 47*  CREATININE 4.48* 3.69* 1.91* 1.38*  CALCIUM 9.2 8.2* 9.2 9.3  MG  --  2.4 2.1 1.8  PHOS  --  4.9*  --   --    GFR: Estimated Creatinine Clearance: 66.4 mL/min (A) (by C-G formula based on SCr of 1.38 mg/dL (H)). Liver Function Tests: Recent Labs  Lab 06/04/21 1535 06/05/21 0214 06/06/21 0811 06/07/21 0044  AST 157*  156* 85* 107* 125*  ALT 77*  75* 42 46* 56*  ALKPHOS 168*  157* 85 90 104  BILITOT 26.0*  26.0* 17.3* 19.5* 18.2*  PROT 10.0*  10.0* 6.8 6.4* 6.3*  ALBUMIN 2.8*  2.8* 2.8* 2.8* 2.8*   No results for input(s): LIPASE, AMYLASE in the last 168 hours. Recent Labs  Lab 06/04/21 1537  AMMONIA 31   Coagulation Profile: Recent Labs  Lab 06/04/21 1535 06/05/21 0214 06/06/21 0811 06/07/21 0044  INR 1.1 1.3* 1.2 1.2   Cardiac Enzymes: No results for input(s): CKTOTAL, CKMB, CKMBINDEX, TROPONINI in the last 168 hours. BNP (last 3 results) No results for input(s): PROBNP in the last 8760 hours. HbA1C: No results for input(s): HGBA1C in the last 72 hours. CBG: No results for input(s): GLUCAP in the last 168 hours. Lipid Profile: No results for input(s): CHOL, HDL, LDLCALC, TRIG, CHOLHDL, LDLDIRECT in the last 72 hours. Thyroid Function Tests: No results for input(s): TSH, T4TOTAL, FREET4, T3FREE, THYROIDAB in the last 72 hours. Anemia Panel: Recent Labs    06/05/21 1258  RETICCTPCT 3.2*   Sepsis Labs: Recent Labs  Lab 06/04/21 1535 06/04/21 1735 06/05/21 0214  PROCALCITON  --   --  6.51  LATICACIDVEN 4.5* 4.3*  --     Recent Results (from the past 240 hour(s))  Blood Culture (routine x 2)     Status: None (Preliminary result)   Collection Time: 06/04/21  3:35 PM    Specimen: BLOOD RIGHT HAND  Result Value Ref Range Status   Specimen Description BLOOD RIGHT HAND  Final   Special Requests   Final    BOTTLES DRAWN AEROBIC AND ANAEROBIC Blood Culture results may not be optimal due to an inadequate volume of blood received in culture bottles   Culture   Final    NO GROWTH 3 DAYS Performed at [facility], [place] [address] [place], [place] [address]    Report Status PENDING  Incomplete  Urine Culture     Status: None   Collection Time: 06/04/21  3:35 PM   Specimen: In/Out Cath Urine  Result Value Ref Range Status   Specimen Description IN/OUT CATH  URINE  Final   Special Requests NONE  Final   Culture   Final    NO GROWTH Performed at [facility], [place] [address], [place], [place] [address]    Report Status 06/06/2021 FINAL  Final  Resp Panel by RT-PCR (Flu A&B, Covid) Nasopharyngeal Swab     Status: None   Collection Time: 06/04/21  9:35 PM   Specimen: Nasopharyngeal Swab; Nasopharyngeal(NP) swabs in vial transport medium  Result Value Ref Range Status   SARS Coronavirus 2 by RT PCR NEGATIVE NEGATIVE Final    Comment: (NOTE) SARS-CoV-2 target nucleic acids are NOT DETECTED.  The SARS-CoV-2 RNA is generally detectable in upper respiratory specimens during the acute phase of infection. The lowest concentration of SARS-CoV-2 viral copies this assay can detect is 138 copies/mL. A negative result does not preclude SARS-Cov-2 infection and should not be used as the sole basis for treatment or other patient management decisions. A negative result may occur with  improper specimen collection/handling, submission of specimen other than nasopharyngeal swab, presence of viral mutation(s) within the areas targeted by this assay, and inadequate number of viral copies(<138 copies/mL). A negative result must be combined with clinical observations, patient history, and epidemiological information. The expected result is Negative.  Fact Sheet for  Patients:  EntrepreneurPulse.com.au  Fact Sheet for Healthcare Providers:  IncredibleEmployment.be  This test is no t yet approved or cleared by the [place] FDA and  has been authorized for detection and/or diagnosis of SARS-CoV-2 by FDA under an Emergency Use Authorization (EUA). This EUA will remain  in effect (meaning this test can be used) for the duration of the COVID-19 declaration under Section 564(b)(1) of the Act, 21 U.S.C.section 360bbb-3(b)(1), unless the authorization is terminated  or revoked sooner.       Influenza A by PCR NEGATIVE NEGATIVE Final   Influenza B by PCR NEGATIVE NEGATIVE Final    Comment: (NOTE) The Xpert Xpress SARS-CoV-2/FLU/RSV plus assay is intended as an aid in the diagnosis of influenza from Nasopharyngeal swab specimens and should not be used as a sole basis for treatment. Nasal washings and aspirates are unacceptable for Xpert Xpress SARS-CoV-2/FLU/RSV testing.  Fact Sheet for Patients: EntrepreneurPulse.com.au  Fact Sheet for Healthcare Providers: IncredibleEmployment.be  This test is not yet approved or cleared by the [place] FDA and has been authorized for detection and/or diagnosis of SARS-CoV-2 by FDA under an Emergency Use Authorization (EUA). This EUA will remain in effect (meaning this test can be used) for the duration of the COVID-19 declaration under Section 564(b)(1) of the Act, 21 U.S.C. section 360bbb-3(b)(1), unless the authorization is terminated or revoked.  Performed at [facility], [place] [address], [place], [place] [address]   Blood Culture (routine x 2)     Status: None (Preliminary result)   Collection Time: 06/05/21  2:07 AM   Specimen: BLOOD RIGHT HAND  Result Value Ref Range Status   Specimen Description BLOOD RIGHT HAND  Final   Special Requests   Final    BOTTLES DRAWN AEROBIC AND ANAEROBIC Blood Culture adequate volume    Culture   Final    NO GROWTH 2 DAYS Performed at [facility], [place] [address] [place],  [address]    Report Status PENDING  Incomplete         Radiology Studies: No results found.      Scheduled Meds:  sodium chloride   Intravenous Once   bisacodyl  20 mg Oral  Once   feeding supplement  1 Container Oral TID BM   feeding supplement  237 mL Oral BID BM   folic acid  1 mg Oral Daily   gabapentin  100 mg Oral TID   magnesium oxide  400 mg Oral BID   midodrine  10 mg Oral Q8H   multivitamin with minerals  1 tablet Oral Daily   pantoprazole  40 mg Oral BID   peg 3350 powder  0.5 kit Oral Once   And   peg 3350 powder  0.5 kit Oral Once   potassium chloride  30 mEq Oral Q3H   sodium chloride flush  3 mL Intravenous Q12H   thiamine  100 mg Oral Daily   Or   thiamine  100 mg Intravenous Daily   Continuous Infusions:  albumin human 25 g (06/06/21 2115)   cefTRIAXone (ROCEPHIN)  IV 2 g (06/07/21 0816)     LOS: 3 days    Time spent: 41 minutes spent on chart review, discussion with nursing staff, consultants, updating family and interview/physical exam; more than 50% of that time was spent in counseling and/or coordination of care.    Domonique J [place] DO [facility] Available via Epic secure chat 7am-7pm After these hours, please refer to coverage provider listed on [email or web address] 06/07/2021, 1:01 PM

## 2021-06-08 ENCOUNTER — Inpatient Hospital Stay (HOSPITAL_COMMUNITY): Payer: No Typology Code available for payment source | Admitting: Certified Registered Nurse Anesthetist

## 2021-06-08 ENCOUNTER — Encounter (HOSPITAL_COMMUNITY): Payer: Self-pay | Admitting: Internal Medicine

## 2021-06-08 ENCOUNTER — Encounter (HOSPITAL_COMMUNITY)
Admission: EM | Disposition: A | Discharge status: Home Health | Payer: Self-pay | Source: Home / Self Care | Attending: Internal Medicine

## 2021-06-08 DIAGNOSIS — K2100 Gastro-esophageal reflux disease with esophagitis, without bleeding: Secondary | ICD-10-CM

## 2021-06-08 DIAGNOSIS — R7989 Other specified abnormal findings of blood chemistry: Secondary | ICD-10-CM | POA: Diagnosis not present

## 2021-06-08 DIAGNOSIS — K7290 Hepatic failure, unspecified without coma: Secondary | ICD-10-CM

## 2021-06-08 DIAGNOSIS — F1010 Alcohol abuse, uncomplicated: Secondary | ICD-10-CM | POA: Diagnosis not present

## 2021-06-08 DIAGNOSIS — K766 Portal hypertension: Secondary | ICD-10-CM

## 2021-06-08 DIAGNOSIS — K7201 Acute and subacute hepatic failure with coma: Secondary | ICD-10-CM | POA: Diagnosis not present

## 2021-06-08 DIAGNOSIS — R17 Unspecified jaundice: Secondary | ICD-10-CM

## 2021-06-08 DIAGNOSIS — F1020 Alcohol dependence, uncomplicated: Secondary | ICD-10-CM | POA: Diagnosis not present

## 2021-06-08 DIAGNOSIS — K2980 Duodenitis without bleeding: Secondary | ICD-10-CM

## 2021-06-08 HISTORY — PX: BIOPSY: SHX5522

## 2021-06-08 HISTORY — PX: ESOPHAGOGASTRODUODENOSCOPY (EGD) WITH PROPOFOL: SHX5813

## 2021-06-08 LAB — COMPREHENSIVE METABOLIC PANEL
ALT: 76 U/L — ABNORMAL HIGH (ref 0–44)
AST: 151 U/L — ABNORMAL HIGH (ref 15–41)
Albumin: 2.7 g/dL — ABNORMAL LOW (ref 3.5–5.0)
Alkaline Phosphatase: 139 U/L — ABNORMAL HIGH (ref 38–126)
Anion gap: 8 (ref 5–15)
BUN: 21 mg/dL — ABNORMAL HIGH (ref 6–20)
CO2: 26 mmol/L (ref 22–32)
Calcium: 9.3 mg/dL (ref 8.9–10.3)
Chloride: 108 mmol/L (ref 98–111)
Creatinine, Ser: 0.93 mg/dL (ref 0.61–1.24)
GFR, Estimated: >60 mL/min (ref >60)
Glucose, Bld: 111 mg/dL — ABNORMAL HIGH (ref 70–99)
Potassium: 3.2 mmol/L — ABNORMAL LOW (ref 3.5–5.1)
Sodium: 142 mmol/L (ref 135–145)
Total Bilirubin: 15.9 mg/dL — ABNORMAL HIGH (ref 0.3–1.2)
Total Protein: 6.1 g/dL — ABNORMAL LOW (ref 6.5–8.1)

## 2021-06-08 LAB — CBC
HCT: 26.8 % — ABNORMAL LOW (ref 39.0–52.0)
Hemoglobin: 8.9 g/dL — ABNORMAL LOW (ref 13.0–17.0)
MCH: 32.1 pg (ref 26.0–34.0)
MCHC: 33.2 g/dL (ref 30.0–36.0)
MCV: 96.8 fL (ref 80.0–100.0)
Platelets: 63 10*3/uL — ABNORMAL LOW (ref 150–400)
RBC: 2.77 MIL/uL — ABNORMAL LOW (ref 4.22–5.81)
RDW: 18.2 % — ABNORMAL HIGH (ref 11.5–15.5)
WBC: 7.2 10*3/uL (ref 4.0–10.5)
nRBC: 1.5 % — ABNORMAL HIGH (ref 0.0–0.2)

## 2021-06-08 LAB — PROTIME-INR
INR: 1.2 (ref 0.8–1.2)
Prothrombin Time: 15.5 seconds — ABNORMAL HIGH (ref 11.4–15.2)

## 2021-06-08 LAB — MAGNESIUM: Magnesium: 1.4 mg/dL — ABNORMAL LOW (ref 1.7–2.4)

## 2021-06-08 LAB — PATHOLOGIST SMEAR REVIEW

## 2021-06-08 SURGERY — ESOPHAGOGASTRODUODENOSCOPY (EGD) WITH PROPOFOL
Anesthesia: Monitor Anesthesia Care

## 2021-06-08 MED ORDER — MIDODRINE HCL 5 MG PO TABS
5.0000 mg | ORAL_TABLET | Freq: Three times a day (TID) | ORAL | Status: DC
  Administered 2021-06-08 – 2021-06-12 (×11): 5 mg via ORAL
  Filled 2021-06-08 (×11): qty 1

## 2021-06-08 MED ORDER — LACTATED RINGERS IV SOLN: INTRAVENOUS | Status: DC | PRN

## 2021-06-08 MED ORDER — POLYETHYLENE GLYCOL 3350 17 GM/SCOOP PO POWD
1.0000 | Freq: Once | ORAL | Status: AC
  Administered 2021-06-08: 122.5 g via ORAL
  Filled 2021-06-08: qty 255

## 2021-06-08 MED ORDER — LACTATED RINGERS IV SOLN: Freq: Once | INTRAVENOUS | Status: AC

## 2021-06-08 MED ORDER — PHENYLEPHRINE 40 MCG/ML (10ML) SYRINGE FOR IV PUSH (FOR BLOOD PRESSURE SUPPORT)
PREFILLED_SYRINGE | INTRAVENOUS | Status: DC | PRN
  Administered 2021-06-08 (×2): 80 ug via INTRAVENOUS

## 2021-06-08 MED ORDER — METOCLOPRAMIDE HCL 5 MG/ML IJ SOLN
10.0000 mg | Freq: Four times a day (QID) | INTRAMUSCULAR | Status: AC
  Administered 2021-06-08 (×2): 10 mg via INTRAVENOUS
  Filled 2021-06-08 (×2): qty 2

## 2021-06-08 MED ORDER — POTASSIUM CHLORIDE CRYS ER 20 MEQ PO TBCR
40.0000 meq | EXTENDED_RELEASE_TABLET | Freq: Three times a day (TID) | ORAL | Status: AC
  Administered 2021-06-08 (×2): 40 meq via ORAL
  Filled 2021-06-08 (×2): qty 2

## 2021-06-08 MED ORDER — PROPOFOL 500 MG/50ML IV EMUL
INTRAVENOUS | Status: DC | PRN
  Administered 2021-06-08: 100 ug/kg/min via INTRAVENOUS

## 2021-06-08 MED ORDER — PHENYLEPHRINE HCL-NACL 20-0.9 MG/250ML-% IV SOLN
INTRAVENOUS | Status: DC | PRN
  Administered 2021-06-08: 25 ug/min via INTRAVENOUS

## 2021-06-08 MED ORDER — MAGNESIUM OXIDE -MG SUPPLEMENT 400 (240 MG) MG PO TABS
400.0000 mg | ORAL_TABLET | Freq: Two times a day (BID) | ORAL | Status: AC
  Administered 2021-06-08 (×2): 400 mg via ORAL
  Filled 2021-06-08 (×2): qty 1

## 2021-06-08 MED ORDER — PROPOFOL 10 MG/ML IV BOLUS
INTRAVENOUS | Status: DC | PRN
  Administered 2021-06-08 (×2): 20 mg via INTRAVENOUS

## 2021-06-08 MED ORDER — BISACODYL 5 MG PO TBEC
20.0000 mg | DELAYED_RELEASE_TABLET | Freq: Once | ORAL | Status: AC
  Administered 2021-06-08: 20 mg via ORAL
  Filled 2021-06-08: qty 4

## 2021-06-08 NOTE — Anesthesia Preprocedure Evaluation (Addendum)
Anesthesia Evaluation  Patient identified by MRN, date of birth, ID band Patient awake    Reviewed: Allergy & Precautions, Patient's Chart, lab work & pertinent test results  Airway Mallampati: II  TM Distance: >3 FB Neck ROM: Full    Dental  (+) Dental Advisory Given   Pulmonary neg pulmonary ROS, Current Smoker and Patient abstained from smoking.,    breath sounds clear to auscultation       Cardiovascular negative cardio ROS   Rhythm:Regular     Neuro/Psych negative neurological ROS  negative psych ROS   GI/Hepatic negative GI ROS, (+) Cirrhosis     substance abuse  alcohol use, Hepatitis -EtOH abuse   Endo/Other  negative endocrine ROS  Renal/GU negative Renal ROS     Musculoskeletal negative musculoskeletal ROS (+)   Abdominal   Peds  Hematology  (+) Blood dyscrasia, anemia , 8.9/26.8, plt 63   Anesthesia Other Findings   Reproductive/Obstetrics                            Anesthesia Physical Anesthesia Plan  ASA: 3  Anesthesia Plan: MAC   Post-op Pain Management:    Induction:   PONV Risk Score and Plan: 2 and Propofol infusion and TIVA  Airway Management Planned: Natural Airway and Simple Face Mask  Additional Equipment: None  Intra-op Plan:   Post-operative Plan:   Informed Consent:   Plan Discussed with:   Anesthesia Plan Comments:         Anesthesia Quick Evaluation

## 2021-06-08 NOTE — Anesthesia Procedure Notes (Signed)
Procedure Name: MAC Date/Time: 06/08/2021 3:00 PM Performed by: Rande Brunt, CRNA Pre-anesthesia Checklist: Patient identified, Emergency Drugs available, Suction available and Patient being monitored Patient Re-evaluated:Patient Re-evaluated prior to induction Oxygen Delivery Method: Nasal cannula Preoxygenation: Pre-oxygenation with 100% oxygen Induction Type: IV induction Airway Equipment and Method: Bite block Placement Confirmation: CO2 detector and positive ETCO2 Dental Injury: Teeth and Oropharynx as per pre-operative assessment

## 2021-06-08 NOTE — Interval H&P Note (Signed)
History and Physical Interval Note:  06/08/2021 2:45 PM  Dylan Weaver  has presented today for surgery, with the diagnosis of Anemia.  Alcoholic hepatitis..  The various methods of treatment have been discussed with the patient and family. After consideration of risks, benefits and other options for treatment, the patient has consented to  Procedure(s): ESOPHAGOGASTRODUODENOSCOPY (EGD) WITH PROPOFOL (N/A) COLONOSCOPY WITH PROPOFOL (N/A) as a surgical intervention.  The patient's history has been reviewed, patient examined, no change in status, stable for surgery.  I have reviewed the patient's chart and labs.  Questions were answered to the patient's satisfaction.     Imogene Burn

## 2021-06-08 NOTE — Op Note (Signed)
[facility] Patient Name: Dylan Weaver Procedure Date : 06/08/2021 MRN: [id number] Attending MD: Particia Lather ,  Date of Birth: 05-16-1966 CSN: [id number] Age: 55 Admit Type: Outpatient Procedure:                Upper GI endoscopy Indications:              Unexplained iron deficiency anemia Providers:                Madelyn Brunner" Virgina Evener, Technician Referring MD:              Medicines:                Monitored Anesthesia Care Complications:            No immediate complications. Estimated Blood Loss:     Estimated blood loss was minimal. Procedure:                After obtaining informed consent, the endoscope was                            passed under direct vision. Throughout the                            procedure, the patient's blood pressure, pulse, and                            oxygen saturations were monitored continuously. The                            [id number]-H190 ([id number]) Olympus endoscope was introduced                            through the mouth, and advanced to the second part                            of duodenum. The upper GI endoscopy was                            accomplished without difficulty. The patient                            tolerated the procedure well. Scope In: Scope Out: Findings:      LA Grade D (one or more mucosal breaks involving at least 75% of       esophageal circumference) esophagitis with no bleeding was found.       Biopsies were taken with a cold forceps for histology.      Portal hypertensive gastropathy versus gastritis was found in the       gastric fundus and in the gastric body.      Mild inflammation characterized by erosions and erythema was found in       the duodenal bulb. Impression:               - LA Grade D reflux esophagitis with no bleeding.  Biopsied.                           - Portal hypertensive gastropathy  versus gastritis                           - Duodenitis. Recommendation:           - Return patient to hospital ward for ongoing care.                           - It is suspected that the patient's iron                            deficiency anemia may be due to his grade D                            esophagitis. Recommend medical therapy for now.                           - Use a proton pump inhibitor PO BID for 8 days.                           - Use sucralfate suspension 1 gram PO QID for 4                            weeks.                           - Repeat upper endoscopy in 8 weeks to check                            healing.                           - Check serum H pylori antibody. Treat if positive.                           - Perform a colonoscopy tomorrow.                           - The findings and recommendations were discussed                            with the patient and primary team. Procedure Code(s):        --- Professional ---                           [phone], Esophagogastroduodenoscopy, flexible,                            transoral; with biopsy, single or multiple Diagnosis Code(s):        --- Professional ---                           K21.00, Gastro-esophageal reflux disease with  esophagitis, without bleeding                           K76.6, Portal hypertension                           K31.89, Other diseases of stomach and duodenum                           K29.80, Duodenitis without bleeding                           D50.9, Iron deficiency anemia, unspecified CPT copyright 2019 American Medical Association. All rights reserved. The codes documented in this report are preliminary and upon coder review may  be revised to meet current compliance requirements. Nicole Kindred "Eulah Pont,  06/08/2021 3:31:11 PM Number of Addenda: 0

## 2021-06-08 NOTE — Progress Notes (Addendum)
[facility] PROGRESS NOTE    Progress Note  Dylan Weaver  NWG:[id number] DOB: 05/15/1966 DOA: 06/04/2021 PCP: [name]     Brief Narrative:   Dylan Weaver is an 55 y.o. male severe alcohol abuse who presents to the ED for weakness and jaundice on admission on admission history was limited due to his encephalopathy he was found to be in acute liver failure related to alcohol abuse.  His hemoglobin dropped to 5.4 he had to be transfused several units of packed red blood cells, his FOBT was positive GI was consulted and is going for colonoscopy and EGD on 06/08/2022.     Assessment/Plan:   Acute liver failure with hepatic coma (HCC)/acute metabolic encephalopathy/hepatic encephalopathy Likely due to alcohol abuse.  On admission was hypotensive so he was fluid resuscitated started on octreotide antibiotics and PPI renal ultrasound showed no acute findings, acute hepatitis panel was negative. GI was consulted. His meld score was 33, which predicts a high mortality in 90 days. MDF on admission 7.6 not a candidate for steroids.  RT tried discontinued on 06/07/2021. Continue Protonix, Rocephin. LFTs continues to rise, bilirubin is improving GI like to proceed with colonoscopy and endoscopy on 06/08/2021 patient is agreeable. Patient encephalopathy has resolved.  Possible GI bleed: Admission hemoglobin was low he was transfused 2 units of packed red blood cells. Started on IV Protonix.  Is improved today is 8.9. He is scheduled for EGD this afternoon.  Acute kidney injury: With a baseline creatinine of 0.7, on admission 4.4. It improved with IV fluid hydration midodrine and albumin.  Discontinue albumin.  Alcohol use disorder, severe, dependence (HCC) At risk of severe withdrawals. Continue thiamine and folate continue to monitor with CIWA protocol.  Hypokalemia/hypomagnesemia: Replete orally recheck in the morning.  Chronic  thrombocytopenia: This morning is 67 likely due to alcohol abuse.  Weakness/deconditioning: Physical therapy evaluated the patient recommended skilled nursing facility.  DVT prophylaxis: scd Family Communication:none Status is: Inpatient  Remains inpatient appropriate because:Hemodynamically unstable  Dispo: The patient is from: Home              Anticipated d/c is to: SNF              Patient currently is not medically stable to d/c.   Difficult to place patient No     Code Status:     Code Status Orders  (From admission, onward)           Start     Ordered   06/04/21 1953  Full code  Continuous        06/04/21 1955           Code Status History     Date Active Date Inactive Code Status Order ID Comments User Context   02/07/2021 1615 02/08/2021 1608 Full Code [id number]  Legrand Rams ED   11/29/2020 2312 12/04/2020 1705 Full Code [id number]  Briscoe Deutscher, MD ED   09/14/2020 2205 09/24/2020 1514 Full Code [id number]  Eduard Clos, MD ED         IV Access:   Peripheral IV   Procedures and diagnostic studies:   No results found.   Medical Consultants:   None.   Subjective:    Dylan Weaver has no new complaints.  Objective:    Vitals:   06/07/21 1945 06/07/21 2336 06/08/21 0320 06/08/21 0720  BP: 94/67 103/78 104/72 122/88  Pulse: 77 68 72 74  Resp: 18 16 17  19  Temp: 97.6 F (36.4 C) 97.9 F (36.6 C) 97.6 F (36.4 C) 97.6 F (36.4 C)  TempSrc: Oral Oral Oral Oral  SpO2: 95% 100% 96% 96%  Weight:       SpO2: 96 % O2 Flow Rate (L/min): 0 L/min   Intake/Output Summary (Last 24 hours) at 06/08/2021 0918 Last data filed at 06/08/2021 0321 Gross per 24 hour  Intake 790 ml  Output 1600 ml  Net -810 ml   Filed Weights   06/04/21 1600  Weight: 81.6 kg    Exam: General exam: In no acute distress. Respiratory system: Good air movement and clear to auscultation. Cardiovascular system: S1 & S2 heard, RRR. No  JVD. Gastrointestinal system: Abdomen is nondistended, soft and nontender.  Extremities: No pedal edema. Skin: Jaundice Psychiatry: Judgement and insight appear normal. Mood & affect appropriate.    Data Reviewed:    Labs: Basic Metabolic Panel: Recent Labs  Lab 06/04/21 1535 06/05/21 0214 06/06/21 0811 06/07/21 0044 06/08/21 0046  NA 139 141 146* 142 142  K 3.4* 3.0* 3.0* 3.0* 3.2*  CL 90* 98 109 104 108  CO2 25 23 28 28 26   GLUCOSE 128* 117* 119* 109* 111*  BUN 129* 121* 72* 47* 21*  CREATININE 4.48* 3.69* 1.91* 1.38* 0.93  CALCIUM 9.2 8.2* 9.2 9.3 9.3  MG  --  2.4 2.1 1.8 1.4*  PHOS  --  4.9*  --   --   --    GFR Estimated Creatinine Clearance: 98.5 mL/min (by C-G formula based on SCr of 0.93 mg/dL). Liver Function Tests: Recent Labs  Lab 06/04/21 1535 06/05/21 0214 06/06/21 0811 06/07/21 0044 06/08/21 0046  AST 157*  156* 85* 107* 125* 151*  ALT 77*  75* 42 46* 56* 76*  ALKPHOS 168*  157* 85 90 104 139*  BILITOT 26.0*  26.0* 17.3* 19.5* 18.2* 15.9*  PROT 10.0*  10.0* 6.8 6.4* 6.3* 6.1*  ALBUMIN 2.8*  2.8* 2.8* 2.8* 2.8* 2.7*   No results for input(s): LIPASE, AMYLASE in the last 168 hours. Recent Labs  Lab 06/04/21 1537  AMMONIA 31   Coagulation profile Recent Labs  Lab 06/04/21 1535 06/05/21 0214 06/06/21 0811 06/07/21 0044 06/08/21 0046  INR 1.1 1.3* 1.2 1.2 1.2   COVID-19 Labs  No results for input(s): DDIMER, FERRITIN, LDH, CRP in the last 72 hours.  Lab Results  Component Value Date   SARSCOV2NAA NEGATIVE 06/04/2021   SARSCOV2NAA NEGATIVE 03/13/2021   SARSCOV2NAA NEGATIVE 02/07/2021   SARSCOV2NAA NEGATIVE 11/29/2020    CBC: Recent Labs  Lab 06/04/21 1535 06/05/21 1008 06/06/21 0811 06/07/21 0044 06/08/21 0046  WBC 9.4 7.4 7.1 7.8 7.2  NEUTROABS 7.6  --   --   --   --   HGB 10.3* 5.6* 8.8* 8.9* 8.9*  HCT 31.5* 16.4* 26.3* 26.4* 26.8*  MCV 100.3* 98.8 94.6 95.3 96.8  PLT 131* 77* 62* 59* 63*   Cardiac Enzymes: No  results for input(s): CKTOTAL, CKMB, CKMBINDEX, TROPONINI in the last 168 hours. BNP (last 3 results) No results for input(s): PROBNP in the last 8760 hours. CBG: No results for input(s): GLUCAP in the last 168 hours. D-Dimer: No results for input(s): DDIMER in the last 72 hours. Hgb A1c: No results for input(s): HGBA1C in the last 72 hours. Lipid Profile: No results for input(s): CHOL, HDL, LDLCALC, TRIG, CHOLHDL, LDLDIRECT in the last 72 hours. Thyroid function studies: No results for input(s): TSH, T4TOTAL, T3FREE, THYROIDAB in the last 72 hours.  Invalid input(s): FREET3 Anemia work up: Recent Labs    06/05/21 1258  RETICCTPCT 3.2*   Sepsis Labs: Recent Labs  Lab 06/04/21 1535 06/04/21 1735 06/05/21 0214 06/05/21 1008 06/06/21 0811 06/07/21 0044 06/08/21 0046  PROCALCITON  --   --  6.51  --   --   --   --   WBC 9.4  --   --  7.4 7.1 7.8 7.2  LATICACIDVEN 4.5* 4.3*  --   --   --   --   --    Microbiology Recent Results (from the past 240 hour(s))  Blood Culture (routine x 2)     Status: None (Preliminary result)   Collection Time: 06/04/21  3:35 PM   Specimen: BLOOD RIGHT HAND  Result Value Ref Range Status   Specimen Description BLOOD RIGHT HAND  Final   Special Requests   Final    BOTTLES DRAWN AEROBIC AND ANAEROBIC Blood Culture results may not be optimal due to an inadequate volume of blood received in culture bottles   Culture   Final    NO GROWTH 4 DAYS Performed at [facility], [address] [place], [place] [address]    Report Status PENDING  Incomplete  Urine Culture     Status: None   Collection Time: 06/04/21  3:35 PM   Specimen: In/Out Cath Urine  Result Value Ref Range Status   Specimen Description IN/OUT CATH URINE  Final   Special Requests NONE  Final   Culture   Final    NO GROWTH Performed at [facility], [address] [place], [place] [address]    Report Status 06/06/2021 FINAL  Final  Resp Panel by RT-PCR (Flu A&B,  Covid) Nasopharyngeal Swab     Status: None   Collection Time: 06/04/21  9:35 PM   Specimen: Nasopharyngeal Swab; Nasopharyngeal(NP) swabs in vial transport medium  Result Value Ref Range Status   SARS Coronavirus 2 by RT PCR NEGATIVE NEGATIVE Final    Comment: (NOTE) SARS-CoV-2 target nucleic acids are NOT DETECTED.  The SARS-CoV-2 RNA is generally detectable in upper respiratory specimens during the acute phase of infection. The lowest concentration of SARS-CoV-2 viral copies this assay can detect is 138 copies/mL. A negative result does not preclude SARS-Cov-2 infection and should not be used as the sole basis for treatment or other patient management decisions. A negative result may occur with  improper specimen collection/handling, submission of specimen other than nasopharyngeal swab, presence of viral mutation(s) within the areas targeted by this assay, and inadequate number of viral copies(<138 copies/mL). A negative result must be combined with clinical observations, patient history, and epidemiological information. The expected result is Negative.  Fact Sheet for Patients:  BloggerCourse.com  Fact Sheet for Healthcare Providers:  SeriousBroker.it  This test is no t yet approved or cleared by the [place] FDA and  has been authorized for detection and/or diagnosis of SARS-CoV-2 by FDA under an Emergency Use Authorization (EUA). This EUA will remain  in effect (meaning this test can be used) for the duration of the COVID-19 declaration under Section 564(b)(1) of the Act, 21 U.S.C.section 360bbb-3(b)(1), unless the authorization is terminated  or revoked sooner.       Influenza A by PCR NEGATIVE NEGATIVE Final   Influenza B by PCR NEGATIVE NEGATIVE Final    Comment: (NOTE) The Xpert Xpress SARS-CoV-2/FLU/RSV plus assay is intended as an aid in the diagnosis of influenza from Nasopharyngeal swab specimens and should  not be used as a sole basis for treatment. Nasal washings and aspirates are unacceptable for Xpert Xpress SARS-CoV-2/FLU/RSV testing.  Fact Sheet for Patients: BloggerCourse.comhttps://www.fda.gov/media/152166/download  Fact Sheet for Healthcare Providers: SeriousBroker.ithttps://www.fda.gov/media/152162/download  This test is not yet approved or cleared by the [place] FDA and has been authorized for detection and/or diagnosis of SARS-CoV-2 by FDA under an Emergency Use Authorization (EUA). This EUA will remain in effect (meaning this test can be used) for the duration of the COVID-19 declaration under Section 564(b)(1) of the Act, 21 U.S.C. section 360bbb-3(b)(1), unless the authorization is terminated or revoked.  Performed at [facility], [address] [place], [place] [address]   Blood Culture (routine x 2)     Status: None (Preliminary result)   Collection Time: 06/05/21  2:07 AM   Specimen: BLOOD RIGHT HAND  Result Value Ref Range Status   Specimen Description BLOOD RIGHT HAND  Final   Special Requests   Final    BOTTLES DRAWN AEROBIC AND ANAEROBIC Blood Culture adequate volume   Culture   Final    NO GROWTH 3 DAYS Performed at [facility], [address] [place], [place] [address]    Report Status PENDING  Incomplete     Medications:    sodium chloride   Intravenous Once   feeding supplement  1 Container Oral TID BM   feeding supplement  237 mL Oral BID BM   folic acid  1 mg Oral Daily   gabapentin  100 mg Oral TID   midodrine  10 mg Oral Q8H   multivitamin with minerals  1 tablet Oral Daily   pantoprazole  40 mg Oral BID   sodium chloride flush  3 mL Intravenous Q12H   thiamine  100 mg Oral Daily   Or   thiamine  100 mg Intravenous Daily   Continuous Infusions:  albumin human 25 g (06/07/21 2117)   cefTRIAXone (ROCEPHIN)  IV 2 g (06/08/21 0903)      LOS: 4 days   Marinda ElkAbraham Feliz Ortiz  [facility]  06/08/2021, 9:18 AM

## 2021-06-08 NOTE — Progress Notes (Signed)
SLP Cancellation Note  Patient Details Name: Dylan Weaver MRN: [id number] DOB: [birth year]   Cancelled treatment:       Reason Eval/Treat Not Completed: Medical issues which prohibited therapy. Unable to assess diet tolerance at this time, as pt is currently NPO for procedures today. Will continue efforts.   Jhania Etherington B. Murvin Natal, [facility], CCC-SLP Speech Language Pathologist Office: [phone]  [name] 06/08/2021, 11:42 AM

## 2021-06-08 NOTE — Transfer of Care (Signed)
Immediate Anesthesia Transfer of Care Note  Patient: Dylan Weaver  Procedure(s) Performed: ESOPHAGOGASTRODUODENOSCOPY (EGD) WITH PROPOFOL BIOPSY  Patient Location: Endoscopy Unit  Anesthesia Type:MAC  Level of Consciousness: awake, drowsy and patient cooperative  Airway & Oxygen Therapy: Patient Spontanous Breathing  Post-op Assessment: Report given to RN, Post -op Vital signs reviewed and stable and Patient moving all extremities  Post vital signs: Reviewed and stable  Last Vitals:  Vitals Value Taken Time  BP 90/60 06/08/21 1520  Temp 36.5 C 06/08/21 1520  Pulse 73 06/08/21 1520  Resp 28 06/08/21 1520  SpO2 99 % 06/08/21 1520    Last Pain:  Vitals:   06/08/21 1520  TempSrc: Temporal  PainSc: 0-No pain      Patients Stated Pain Goal: 0 (35/45/62 5638)  Complications: No notable events documented.

## 2021-06-09 ENCOUNTER — Telehealth: Payer: Self-pay

## 2021-06-09 ENCOUNTER — Other Ambulatory Visit: Payer: Self-pay

## 2021-06-09 DIAGNOSIS — K7201 Acute and subacute hepatic failure with coma: Secondary | ICD-10-CM | POA: Diagnosis not present

## 2021-06-09 DIAGNOSIS — F1020 Alcohol dependence, uncomplicated: Secondary | ICD-10-CM | POA: Diagnosis not present

## 2021-06-09 DIAGNOSIS — R7989 Other specified abnormal findings of blood chemistry: Secondary | ICD-10-CM | POA: Diagnosis not present

## 2021-06-09 DIAGNOSIS — F1010 Alcohol abuse, uncomplicated: Secondary | ICD-10-CM | POA: Diagnosis not present

## 2021-06-09 LAB — CULTURE, BLOOD (ROUTINE X 2): Culture: NO GROWTH

## 2021-06-09 LAB — CBC
HCT: 30.6 % — ABNORMAL LOW (ref 39.0–52.0)
Hemoglobin: 10 g/dL — ABNORMAL LOW (ref 13.0–17.0)
MCH: 31.7 pg (ref 26.0–34.0)
MCHC: 32.7 g/dL (ref 30.0–36.0)
MCV: 97.1 fL (ref 80.0–100.0)
Platelets: 63 10*3/uL — ABNORMAL LOW (ref 150–400)
RBC: 3.15 MIL/uL — ABNORMAL LOW (ref 4.22–5.81)
RDW: 18.6 % — ABNORMAL HIGH (ref 11.5–15.5)
WBC: 7.4 10*3/uL (ref 4.0–10.5)
nRBC: 0.5 % — ABNORMAL HIGH (ref 0.0–0.2)

## 2021-06-09 LAB — COMPREHENSIVE METABOLIC PANEL
ALT: 90 U/L — ABNORMAL HIGH (ref 0–44)
AST: 155 U/L — ABNORMAL HIGH (ref 15–41)
Albumin: 2.5 g/dL — ABNORMAL LOW (ref 3.5–5.0)
Alkaline Phosphatase: 201 U/L — ABNORMAL HIGH (ref 38–126)
Anion gap: 8 (ref 5–15)
BUN: 10 mg/dL (ref 6–20)
CO2: 26 mmol/L (ref 22–32)
Calcium: 9.3 mg/dL (ref 8.9–10.3)
Chloride: 106 mmol/L (ref 98–111)
Creatinine, Ser: 0.68 mg/dL (ref 0.61–1.24)
GFR, Estimated: >60 mL/min (ref >60)
Glucose, Bld: 90 mg/dL (ref 70–99)
Potassium: 3.5 mmol/L (ref 3.5–5.1)
Sodium: 140 mmol/L (ref 135–145)
Total Bilirubin: 14.7 mg/dL — ABNORMAL HIGH (ref 0.3–1.2)
Total Protein: 6.3 g/dL — ABNORMAL LOW (ref 6.5–8.1)

## 2021-06-09 LAB — IRON AND TIBC
Iron: 110 ug/dL (ref 45–182)
Saturation Ratios: 73 % — ABNORMAL HIGH (ref 17.9–39.5)
TIBC: 151 ug/dL — ABNORMAL LOW (ref 250–450)
UIBC: 41 ug/dL

## 2021-06-09 LAB — RETICULOCYTES
Immature Retic Fract: 43.6 % — ABNORMAL HIGH (ref 2.3–15.9)
RBC.: 2.84 MIL/uL — ABNORMAL LOW (ref 4.22–5.81)
Retic Count, Absolute: 172.4 10*3/uL (ref 19.0–186.0)
Retic Ct Pct: 6.1 % — ABNORMAL HIGH (ref 0.4–3.1)

## 2021-06-09 LAB — FERRITIN: Ferritin: 902 ng/mL — ABNORMAL HIGH (ref 24–336)

## 2021-06-09 LAB — AMMONIA: Ammonia: 31 umol/L (ref 9–35)

## 2021-06-09 LAB — FOLATE: Folate: 8 ng/mL (ref >5.9)

## 2021-06-09 LAB — PROTIME-INR
INR: 1.2 (ref 0.8–1.2)
Prothrombin Time: 15.2 seconds (ref 11.4–15.2)

## 2021-06-09 LAB — MAGNESIUM: Magnesium: 1.1 mg/dL — ABNORMAL LOW (ref 1.7–2.4)

## 2021-06-09 LAB — VITAMIN B12: Vitamin B-12: 549 pg/mL (ref 180–914)

## 2021-06-09 MED ORDER — CEFDINIR 300 MG PO CAPS
300.0000 mg | ORAL_CAPSULE | Freq: Two times a day (BID) | ORAL | Status: AC
  Administered 2021-06-10 – 2021-06-11 (×3): 300 mg via ORAL
  Filled 2021-06-09 (×3): qty 1

## 2021-06-09 MED ORDER — SODIUM CHLORIDE 0.9 % IV SOLN: INTRAVENOUS | Status: DC

## 2021-06-09 MED ORDER — MAGNESIUM OXIDE -MG SUPPLEMENT 400 (240 MG) MG PO TABS
400.0000 mg | ORAL_TABLET | Freq: Two times a day (BID) | ORAL | Status: AC
  Administered 2021-06-09 (×2): 400 mg via ORAL
  Filled 2021-06-09 (×2): qty 1

## 2021-06-09 MED ORDER — CIPROFLOXACIN HCL 500 MG PO TABS: 500.0000 mg | ORAL_TABLET | Freq: Every day | ORAL | Status: DC

## 2021-06-09 MED ORDER — MAGNESIUM SULFATE 2 GM/50ML IV SOLN
2.0000 g | Freq: Once | INTRAVENOUS | Status: AC
  Administered 2021-06-09: 2 g via INTRAVENOUS
  Filled 2021-06-09: qty 50

## 2021-06-09 MED ORDER — POTASSIUM CHLORIDE CRYS ER 20 MEQ PO TBCR
40.0000 meq | EXTENDED_RELEASE_TABLET | Freq: Two times a day (BID) | ORAL | Status: AC
  Administered 2021-06-09 (×2): 40 meq via ORAL
  Filled 2021-06-09 (×2): qty 2

## 2021-06-09 MED ORDER — PEG-KCL-NACL-NASULF-NA ASC-C 100 G PO SOLR
1.0000 | Freq: Once | ORAL | Status: AC
  Administered 2021-06-09: 200 g via ORAL
  Filled 2021-06-09: qty 1

## 2021-06-09 NOTE — Telephone Encounter (Signed)
Left message on machine to call back  

## 2021-06-09 NOTE — Anesthesia Preprocedure Evaluation (Addendum)
Anesthesia Evaluation  Patient identified by MRN, date of birth, ID band Patient awake    Reviewed: Allergy & Precautions, NPO status , Patient's Chart, lab work & pertinent test results  Airway Mallampati: II  TM Distance: >3 FB Neck ROM: Full    Dental no notable dental hx. (+) Missing, Dental Advisory Given,    Pulmonary neg pulmonary ROS, Current Smoker and Patient abstained from smoking.,    Pulmonary exam normal breath sounds clear to auscultation       Cardiovascular negative cardio ROS Normal cardiovascular exam Rhythm:Regular Rate:Normal     Neuro/Psych negative neurological ROS  negative psych ROS   GI/Hepatic negative GI ROS, (+) Cirrhosis     substance abuse  alcohol use, Hepatitis -EtOH abuse   Endo/Other  negative endocrine ROS  Renal/GU negative Renal ROS  negative genitourinary   Musculoskeletal negative musculoskeletal ROS (+)   Abdominal   Peds  Hematology  (+) Blood dyscrasia, anemia , 8.9/26.8, plt 63   Anesthesia Other Findings   Reproductive/Obstetrics negative OB ROS                            Anesthesia Physical Anesthesia Plan  ASA: 4  Anesthesia Plan: MAC   Post-op Pain Management:    Induction:   PONV Risk Score and Plan: Treatment may vary due to age or medical condition  Airway Management Planned: Natural Airway and Nasal Cannula  Additional Equipment: None  Intra-op Plan:   Post-operative Plan:   Informed Consent: I have reviewed the patients History and Physical, chart, labs and discussed the procedure including the risks, benefits and alternatives for the proposed anesthesia with the patient or authorized representative who has indicated his/her understanding and acceptance.     Dental advisory given  Plan Discussed with:   Anesthesia Plan Comments: (Anemia for colonoscopy under MAC)       Anesthesia Quick Evaluation

## 2021-06-09 NOTE — Telephone Encounter (Signed)
-----   Message from Imogene Burn, MD sent at 06/08/2021  8:15 PM EDT ----- [facility] would work, thank you! ----- Message ----- From: Loretha Stapler, RN Sent: 06/08/2021   3:34 PM EDT To: Imogene Burn, MD  Yes, I can schedule.  Will this need to be in the [facility] or hospital.   ----- Message ----- From: Imogene Burn, MD Sent: 06/08/2021   3:27 PM EDT To: Loretha Stapler, RN, Lynann Bologna, MD  Hi Bon Dowis,  I just did an EGD on this patient who Dr. Christella Hartigan saw as an inpatient. He will need a repeat EGD in 8 weeks to evaluate for healing of esophagitis. Would you be able to schedule this with him?  Thanks, Alan Ripper

## 2021-06-09 NOTE — Anesthesia Postprocedure Evaluation (Signed)
Anesthesia Post Note  Patient: Dylan Weaver  Procedure(s) Performed: ESOPHAGOGASTRODUODENOSCOPY (EGD) WITH PROPOFOL BIOPSY     Patient location during evaluation: Endoscopy Anesthesia Type: MAC Level of consciousness: awake and alert Pain management: pain level controlled Vital Signs Assessment: post-procedure vital signs reviewed and stable Respiratory status: spontaneous breathing, nonlabored ventilation, respiratory function stable and patient connected to nasal cannula oxygen Cardiovascular status: stable and blood pressure returned to baseline Postop Assessment: no apparent nausea or vomiting Anesthetic complications: no   No notable events documented.  Last Vitals:  Vitals:   06/09/21 1727 06/09/21 2148  BP: (!) 87/57 (!) 88/63  Pulse: 76 73  Resp: 19 20  Temp: 36.8 C 36.7 C  SpO2: 96% 100%    Last Pain:  Vitals:   06/09/21 2148  TempSrc: Oral  PainSc:                  Dylan Weaver

## 2021-06-09 NOTE — H&P (View-Only) (Signed)
[facility] Progress Note  CC:  Elevated LFTs, ETOH abuse    Subjective: He is mildly lethargic this morning, answering questions with brief delay. He nearly finished the colonoscopy bowel prep. RN verified he prepped well, passing water per the rectum. He denies having any CP, SOB or palpitations. No N/V. No abdominal pain. No family at the bedside.   ADDENDUM: I just received a call from the patient's RN who stated the patient just passed a very small soft BM.  Stat water enema ordered.  Objective:   EGD 06/09/2021: - LA Grade D reflux esophagitis with no bleeding. Biopsied. - Portal hypertensive gastropathy versus gastritis - Duodenitis.  Vital signs in last 24 hours: Temp:  [97.5 F (36.4 C)-98 F (36.7 C)] 97.7 F (36.5 C) (09/08 0412) Pulse Rate:  [58-79] 65 (09/08 0412) Resp:  [14-28] 19 (09/08 0412) BP: (72-109)/(41-73) 91/59 (09/08 0412) SpO2:  [96 %-99 %] 98 % (09/08 0412) Last BM Date: 06/08/21  General: 55 years-year-old male somewhat somnolent this morning but more arousable after lights turned on. Eyes: Moderate scleral icterus. Heart: Slightly irregular rhythm, no murmurs. Pulm: Breath sounds clear, diminished in the bases. Abdomen: Soft, nondistended.  No obvious ascites.  Nontender.  Positive bowel sounds x 4 quadrants.  No hepatosplenomegaly.  Positive bowel sounds all 4 quadrants. Extremities:  No edema. Neurologic:  Alert and  oriented x 3.  Answers questions appropriately but with delayed response.  Upper extremities mildly tremulous without discrete asterixis.  Moves all extremities. Psych:  Alert and cooperative. Normal mood and affect.  Intake/Output from previous day: 09/07 0701 - 09/08 0700 In: 200 [I.V.:200] Out: 2500 [Urine:2500] Intake/Output this shift: No intake/output data recorded.  Lab Results: Recent Labs    06/07/21 0044 06/08/21 0046 06/09/21 0122  WBC 7.8 7.2 7.4  HGB 8.9* 8.9* 10.0*  HCT 26.4* 26.8* 30.6*   PLT 59* 63* 63*   BMET Recent Labs    06/07/21 0044 06/08/21 0046 06/09/21 0122  NA 142 142 140  K 3.0* 3.2* 3.5  CL 104 108 106  CO2 _0 GLUCOSE 109* 111* 90  BUN 47* 21* 10  CREATININE 1.38* 0.93 0.68  CALCIUM 9.3 9.3 9.3   LFT Recent Labs    06/09/21 0122  PROT 6.3*  ALBUMIN 2.5*  AST 155*  ALT 90*  ALKPHOS 201*  BILITOT 14.7*   PT/INR Recent Labs    06/08/21 0046 06/09/21 0122  LABPROT 15.5* 15.2  INR 1.2 1.2   Hepatitis Panel No results for input(s): HEPBSAG, HCVAB, HEPAIGM, HEPBIGM in the last 72 hours.  No results found.  Assessment / Plan:  40) 55 year old male admitted to the hospital with alcoholic hepatitis 01/30/7000.  MDF less than 32 therefore prednisolone was not initiated.  Acute hepatitis panel negative. Today Alk phos 201. T. Bili 15.9 -> 14.7. AST 155. ALT 90. INR 1.2. RUQ sono 9/3 identified cholelithiasis without evidence of acute cholecystitis and hepatic steatosis without evidence of cirrhosis or liver lesion.  No ascites. -Ammonia level -Repeat hepatic panel in a.m. -Monitor neuro status closely -No alcohol ever discussed with the patient  2) Anemia. Hg 10.3 on 9/3 -> Hg 5.6 on 9/4 transfused 2 units PRBCs -> Hg 8.8. Today Hg 10. S/P EGD 9/7 showed grade D reflux esophagitis without evidence of bleeding, portal hypertensive gastropathy versus gastritis and duodenitis.  H. pylori antibody level pending.  Hemodynamically stable. -Iron, iron saturation, TIBC, ferritin and B12. -PPI p.o. twice daily x  8 weeks -Sucralfate suspension 1 g p.o. 4 times daily for 4 weeks -Repeat EGD in 8 weeks as outpatient to check for esophagitis healing -Proceed with colonoscopy today with Dr. Lorenso Courier -Continue folate and thiamine -Will transition from CTX to ciprofloxacin to complete a 7 day course of antibiotics due to concern for GI bleed with significant Hb drop  3) Thrombocytopenia likely due to bone marrow suppression from alcohol use disorder. PLT  63.   4) AKI with admission Cr 4.4 treated with octreotide, albumin and midodrine.  Creatinine levels continues to improve. Today Cr 0.68. Octreotide was dc'd on 9/5.    Addendum: As noted above, I received a phone call from the patient's RN who stated the patient just passed a very small soft formed BM.  I ordered a stat water enema and his nurse will call me if he passes any further solid stool.  If he has any further solid stools his colonoscopy will need to be rescheduled tomorrow with additional bowel prep today.  Principal Problem:   Acute liver failure with hepatic coma (HCC) Active Problems:   Alcohol use disorder, severe, dependence (HCC)   Thrombocytopenia (HCC)   Elevated LFTs   Acute renal failure (ARF) ([place])     LOS: 5 days   Noralyn Pick  06/09/2021, 08:47 AM

## 2021-06-09 NOTE — Progress Notes (Signed)
Physical Therapy Treatment Patient Details Name: Dylan Weaver MRN: [id number] DOB: 04/07/1966 Today's Date: 06/09/2021    History of Present Illness Pt is a 55yo male presenting to [facility] ED on 9/3 with complaints of weakness and jaundice. Found to be in acute liver failure with hepatic coma (encephalopathy). Chest xray showed possible L atelectasis. PMH: alcohol abuse    PT Comments    The pt was agreeable to session with goal of progressing OOB mobility. The pt was able to complete bed mobility, bilateral rolling, and hip bridging without assist, but does fatigue rapidly with movements. He was able to maintain static sitting balance with VSS and no UE support, but required maxA to attempt stand from EOB. Even with support surface elevated, the pt required maxA and was unable to achieve full knee or hip extension despite max cues and facilitation at knees and hips. The pt was able to maintain for 5 and 8-10 seconds during the two trials, was unable to progress to taking steps at this time. Will continue to benefit from skilled PT to progress functional LE strength, power, and activity tolerance.     Follow Up Recommendations  SNF;Supervision/Assistance - 24 hour     Equipment Recommendations  Rolling walker with 5" wheels;3in1 (PT);Wheelchair cushion (measurements PT);Wheelchair (measurements PT)    Recommendations for Other Services       Precautions / Restrictions Precautions Precautions: Fall Precaution Comments: soft BP Restrictions Weight Bearing Restrictions: No    Mobility  Bed Mobility Overal bed mobility: Needs Assistance Bed Mobility: Supine to Sit;Sit to Supine     Supine to sit: Min assist Sit to supine: Mod assist   General bed mobility comments: pt needing minA with increased time and cues to complete movement to EOB, increased effort to complete scooting to EOB. modA to return to supine, pt able to assist with repositioning with hip bridge     Transfers Overall transfer level: Needs assistance Equipment used: Rolling walker (2 wheeled) Transfers: Sit to/from Stand Sit to Stand: Max assist;From elevated surface         General transfer comment: maxA to come to partial stand, pt unable to achieve knee or hip extension despite maxA and cues as well as attempted facilitation. completed x2 from EOB, once fo 5 sec and once for 8-10  Ambulation/Gait             General Gait Details: unable      Balance Overall balance assessment: Needs assistance Sitting-balance support: Bilateral upper extremity supported;Feet supported Sitting balance-Leahy Scale: Fair Sitting balance - Comments: fair static sitting EOB   Standing balance support: Bilateral upper extremity supported;During functional activity Standing balance-Leahy Scale: Poor Standing balance comment: Pt reliant on maxA and BUE on RW during static stance.                            Cognition Arousal/Alertness: Awake/alert Behavior During Therapy: Flat affect Overall Cognitive Status: No family/caregiver present to determine baseline cognitive functioning                                 General Comments: pt answering questions with increased time, needing increased cues, especially in regards to technique, but able to complete with increased time.      Exercises General Exercises - Lower Extremity Ankle Circles/Pumps: AROM;Both;20 reps;Seated Long Arc Quad: AROM;Both;10 reps;Seated Hip Flexion/Marching: AROM;Both;10 reps;Seated  General Comments General comments (skin integrity, edema, etc.): Pt with BP soft on arrival (87/57 with map of 67) but improved to 97/74 (83) with transition to sitting EOB      Pertinent Vitals/Pain Pain Assessment: Faces Faces Pain Scale: Hurts little more Pain Location: neck Pain Descriptors / Indicators: Other (Comment) (stiff) Pain Intervention(s): Limited activity within patient's  tolerance;Monitored during session;Repositioned     PT Goals (current goals can now be found in the care plan section) Acute Rehab PT Goals Patient Stated Goal: to get his strength back PT Goal Formulation: With patient Time For Goal Achievement: 06/20/21 Potential to Achieve Goals: Good Progress towards PT goals: Progressing toward goals    Frequency    Min 2X/week      PT Plan Current plan remains appropriate    AM-PAC PT "6 Clicks" Mobility   Outcome Measure  Help needed turning from your back to your side while in a flat bed without using bedrails?: A Little Help needed moving from lying on your back to sitting on the side of a flat bed without using bedrails?: A Lot Help needed moving to and from a bed to a chair (including a wheelchair)?: Total Help needed standing up from a chair using your arms (e.g., wheelchair or bedside chair)?: A Lot Help needed to walk in hospital room?: Total Help needed climbing 3-5 steps with a railing? : Total 6 Click Score: 10    End of Session Equipment Utilized During Treatment: Gait belt Activity Tolerance: Patient limited by fatigue Patient left: in bed;with bed alarm set;with call bell/phone within reach Nurse Communication: Mobility status PT Visit Diagnosis: Muscle weakness (generalized) (M62.81);Unsteadiness on feet (R26.81);Difficulty in walking, not elsewhere classified (R26.2)     Time: 5374-8270 PT Time Calculation (min) (ACUTE ONLY): 31 min  Charges:  $Therapeutic Exercise: 8-22 mins $Therapeutic Activity: 8-22 mins                     Vickki Muff, PT, DPT   Acute Rehabilitation Department Pager #: [phone]   Ronnie Derby 06/09/2021, 6:55 PM

## 2021-06-09 NOTE — Progress Notes (Signed)
Bowel prep completed. Frequent incontinent stools. Unable to maintain integrity of foam dressings to buttocks, sacrum, and back due to incontinence. Barrier Cream applied. Will continue to provide pericare and monitor skin.

## 2021-06-09 NOTE — Progress Notes (Signed)
  Speech Language Pathology Treatment: Dysphagia  Patient Details Name: Dylan Weaver MRN: [id number] DOB: 24-Apr-1966 Today's Date: 06/09/2021 Time: 2409-7353 SLP Time Calculation (min) (ACUTE ONLY): 8 min  Assessment / Plan / Recommendation Clinical Impression  Pt was seen for assessment of diet tolerance. Pt is currently restricted to clear liquids due to planned colonoscopy (tomorrow). Pt and RN report no difficulty with thin liquids, no difficulty with whole meds with liquid. Pt was observed with thin liquids at bedside, and did not exhibit overt s/s aspiration following multiple boluses. Will continue to follow pt to assess tolerance of solid textures once cleared by MD.    HPI HPI: Pt is a 55 y.o. male who presented to the ED via EMS for evaluation of weakness and jaundice. CXR on admission: Linear opacity in the left lung base, favor atelectasis. Dx ARF, encephalopathy and acute liver failure thought to be 2/2 severe EtOH abuse. PMH: severe alcohol abuse.      SLP Plan  Continue with current plan of care       Recommendations  Diet recommendations: Thin liquid (clear liquids for now) Liquids provided via: Straw;Cup Medication Administration: Whole meds with liquid Supervision: Staff to assist with self feeding;Intermittent supervision to cue for compensatory strategies Compensations: Minimize environmental distractions;Slow rate;Small sips/bites;Clear throat intermittently Postural Changes and/or Swallow Maneuvers: Seated upright 90 degrees;Upright 30-60 min after meal                Oral Care Recommendations: Oral care BID Follow up Recommendations: Other (comment) (TBD) SLP Visit Diagnosis: Dysphagia, unspecified (R13.10) Plan: Continue with current plan of care       GO               Dylan Weaver B. Murvin Natal, [facility], CCC-SLP Speech Language Pathologist Office: [phone]  [name] 06/09/2021, 2:54 PM

## 2021-06-09 NOTE — Progress Notes (Signed)
[facility] PROGRESS NOTE    Progress Note  Dylan Weaver  DZH:[id number] DOB: 1966-04-18 DOA: 06/04/2021 PCP: [name]     Brief Narrative:   Dylan Weaver is an 55 y.o. male severe alcohol abuse who presents to the ED for weakness and jaundice on admission on admission history was limited due to his encephalopathy he was found to be in acute liver failure related to alcohol abuse.  On admission he was started on IV fluids after tried, antibiotics and a PPI,, since then octreotide has been discontinued, his hemoglobin dropped to 5.4 he had to be transfused several units of packed red blood cells, his FOBT was positive GI was consulted and is going for colonoscopy and EGD on 06/08/2022.     Assessment/Plan:   Acute liver failure with hepatic coma (HCC)/acute metabolic encephalopathy/hepatic encephalopathy Likely due to alcohol abuse.   GI was consulted endoscopy done on 06/09/19 2020 showed esophagitis no signs of bleeding, did show portal hypertensive cath gastropathy versus gastritis and duodenitis. His meld score was 33, which predicts a high mortality in 90 days. MDF on admission 7.6 not a candidate for steroids.   LFTs have plateaued.  Possible GI bleed: Admission hemoglobin was low he was transfused 2 units of packed red blood cells.  Hemoglobin improved at 10.  Transition Protonix to orals. EGD showed esophagitis as a possible cause of delay showed no signs of bleeding. For colonoscopy today.  Acute kidney injury: With a baseline creatinine of 0.7, on admission 4.4. It improved with IV fluid hydration midodrine and albumin.  Discontinue albumin.  Alcohol use disorder, severe, dependence (HCC) At risk of severe withdrawals. Continue thiamine and folate continue to monitor with CIWA protocol.  Hypokalemia/hypomagnesemia: Replete orally recheck in the morning.  Chronic thrombocytopenia: This morning is 63 likely due to alcohol  abuse.  Weakness/deconditioning: Physical therapy evaluated the patient recommended skilled nursing facility.  DVT prophylaxis: scd Family Communication:none Status is: Inpatient  Remains inpatient appropriate because:Hemodynamically unstable  Dispo: The patient is from: Home              Anticipated d/c is to: SNF              Patient currently is not medically stable to d/c.   Difficult to place patient No     Code Status:     Code Status Orders  (From admission, onward)           Start     Ordered   06/04/21 1953  Full code  Continuous        06/04/21 1955           Code Status History     Date Active Date Inactive Code Status Order ID Comments User Context   02/07/2021 1615 02/08/2021 1608 Full Code [id number]  Legrand Rams ED   11/29/2020 2312 12/04/2020 1705 Full Code [id number]  Briscoe Deutscher, MD ED   09/14/2020 2205 09/24/2020 1514 Full Code [id number]  Eduard Clos, MD ED         IV Access:   Peripheral IV   Procedures and diagnostic studies:   No results found.   Medical Consultants:   None.   Subjective:    Dylan Weaver no new complaints  Objective:    Vitals:   06/08/21 2023 06/08/21 2349 06/09/21 0412 06/09/21 0800  BP: 98/71 109/73 (!) 91/59 (!) 89/55  Pulse: 61 79 65 70  Resp: 14 19 19  18  Temp: (!) 97.5 F (36.4 C) 98 F (36.7 C) 97.7 F (36.5 C) (!) 97.5 F (36.4 C)  TempSrc: Axillary Oral Axillary Oral  SpO2: 98% 98% 98% 95%  Weight:       SpO2: 95 % O2 Flow Rate (L/min): 0 L/min   Intake/Output Summary (Last 24 hours) at 06/09/2021 0825 Last data filed at 06/08/2021 1838 Gross per 24 hour  Intake 200 ml  Output 1600 ml  Net -1400 ml    Filed Weights   06/04/21 1600  Weight: 81.6 kg    Exam: General exam: In no acute distress. Respiratory system: Good air movement and clear to auscultation. Cardiovascular system: S1 & S2 heard, RRR. No JVD. Gastrointestinal system: Abdomen is  nondistended, soft and nontender.  Extremities: No pedal edema. Skin: No rashes, lesions or ulcers   Data Reviewed:    Labs: Basic Metabolic Panel: Recent Labs  Lab 06/05/21 0214 06/06/21 0811 06/07/21 0044 06/08/21 0046 06/09/21 0122  NA 141 146* 142 142 140  K 3.0* 3.0* 3.0* 3.2* 3.5  CL 98 109 104 108 106  CO2 23 28 28 26 26   GLUCOSE 117* 119* 109* 111* 90  BUN 121* 72* 47* 21* 10  CREATININE 3.69* 1.91* 1.38* 0.93 0.68  CALCIUM 8.2* 9.2 9.3 9.3 9.3  MG 2.4 2.1 1.8 1.4* 1.1*  PHOS 4.9*  --   --   --   --     GFR Estimated Creatinine Clearance: 114.5 mL/min (by C-G formula based on SCr of 0.68 mg/dL). Liver Function Tests: Recent Labs  Lab 06/05/21 0214 06/06/21 0811 06/07/21 0044 06/08/21 0046 06/09/21 0122  AST 85* 107* 125* 151* 155*  ALT 42 46* 56* 76* 90*  ALKPHOS 85 90 104 139* 201*  BILITOT 17.3* 19.5* 18.2* 15.9* 14.7*  PROT 6.8 6.4* 6.3* 6.1* 6.3*  ALBUMIN 2.8* 2.8* 2.8* 2.7* 2.5*    No results for input(s): LIPASE, AMYLASE in the last 168 hours. Recent Labs  Lab 06/04/21 1537  AMMONIA 31    Coagulation profile Recent Labs  Lab 06/05/21 0214 06/06/21 0811 06/07/21 0044 06/08/21 0046 06/09/21 0122  INR 1.3* 1.2 1.2 1.2 1.2    COVID-19 Labs  No results for input(s): DDIMER, FERRITIN, LDH, CRP in the last 72 hours.  Lab Results  Component Value Date   SARSCOV2NAA NEGATIVE 06/04/2021   SARSCOV2NAA NEGATIVE 03/13/2021   SARSCOV2NAA NEGATIVE 02/07/2021   SARSCOV2NAA NEGATIVE 11/29/2020    CBC: Recent Labs  Lab 06/04/21 1535 06/05/21 1008 06/06/21 0811 06/07/21 0044 06/08/21 0046 06/09/21 0122  WBC 9.4 7.4 7.1 7.8 7.2 7.4  NEUTROABS 7.6  --   --   --   --   --   HGB 10.3* 5.6* 8.8* 8.9* 8.9* 10.0*  HCT 31.5* 16.4* 26.3* 26.4* 26.8* 30.6*  MCV 100.3* 98.8 94.6 95.3 96.8 97.1  PLT 131* 77* 62* 59* 63* 63*    Cardiac Enzymes: No results for input(s): CKTOTAL, CKMB, CKMBINDEX, TROPONINI in the last 168 hours. BNP (last 3  results) No results for input(s): PROBNP in the last 8760 hours. CBG: No results for input(s): GLUCAP in the last 168 hours. D-Dimer: No results for input(s): DDIMER in the last 72 hours. Hgb A1c: No results for input(s): HGBA1C in the last 72 hours. Lipid Profile: No results for input(s): CHOL, HDL, LDLCALC, TRIG, CHOLHDL, LDLDIRECT in the last 72 hours. Thyroid function studies: No results for input(s): TSH, T4TOTAL, T3FREE, THYROIDAB in the last 72 hours.  Invalid input(s): FREET3 Anemia work  up: No results for input(s): VITAMINB12, FOLATE, FERRITIN, TIBC, IRON, RETICCTPCT in the last 72 hours.  Sepsis Labs: Recent Labs  Lab 06/04/21 1535 06/04/21 1735 06/05/21 0214 06/05/21 1008 06/06/21 0811 06/07/21 0044 06/08/21 0046 06/09/21 0122  PROCALCITON  --   --  6.51  --   --   --   --   --   WBC 9.4  --   --    < > 7.1 7.8 7.2 7.4  LATICACIDVEN 4.5* 4.3*  --   --   --   --   --   --    < > = values in this interval not displayed.    Microbiology Recent Results (from the past 240 hour(s))  Blood Culture (routine x 2)     Status: None   Collection Time: 06/04/21  3:35 PM   Specimen: BLOOD RIGHT HAND  Result Value Ref Range Status   Specimen Description BLOOD RIGHT HAND  Final   Special Requests   Final    BOTTLES DRAWN AEROBIC AND ANAEROBIC Blood Culture results may not be optimal due to an inadequate volume of blood received in culture bottles   Culture   Final    NO GROWTH 5 DAYS Performed at [facility], [address], [place], [place] [address]    Report Status 06/09/2021 FINAL  Final  Urine Culture     Status: None   Collection Time: 06/04/21  3:35 PM   Specimen: In/Out Cath Urine  Result Value Ref Range Status   Specimen Description IN/OUT CATH URINE  Final   Special Requests NONE  Final   Culture   Final    NO GROWTH Performed at [facility], [address], [place], [place] [address]    Report Status 06/06/2021 FINAL  Final  Resp Panel by  RT-PCR (Flu A&B, Covid) Nasopharyngeal Swab     Status: None   Collection Time: 06/04/21  9:35 PM   Specimen: Nasopharyngeal Swab; Nasopharyngeal(NP) swabs in vial transport medium  Result Value Ref Range Status   SARS Coronavirus 2 by RT PCR NEGATIVE NEGATIVE Final    Comment: (NOTE) SARS-CoV-2 target nucleic acids are NOT DETECTED.  The SARS-CoV-2 RNA is generally detectable in upper respiratory specimens during the acute phase of infection. The lowest concentration of SARS-CoV-2 viral copies this assay can detect is 138 copies/mL. A negative result does not preclude SARS-Cov-2 infection and should not be used as the sole basis for treatment or other patient management decisions. A negative result may occur with  improper specimen collection/handling, submission of specimen other than nasopharyngeal swab, presence of viral mutation(s) within the areas targeted by this assay, and inadequate number of viral copies(<138 copies/mL). A negative result must be combined with clinical observations, patient history, and epidemiological information. The expected result is Negative.  Fact Sheet for Patients:  BloggerCourse.com  Fact Sheet for Healthcare Providers:  SeriousBroker.it  This test is no t yet approved or cleared by the [place] FDA and  has been authorized for detection and/or diagnosis of SARS-CoV-2 by FDA under an Emergency Use Authorization (EUA). This EUA will remain  in effect (meaning this test can be used) for the duration of the COVID-19 declaration under Section 564(b)(1) of the Act, 21 U.S.C.section 360bbb-3(b)(1), unless the authorization is terminated  or revoked sooner.       Influenza A by PCR NEGATIVE NEGATIVE Final   Influenza B by PCR NEGATIVE NEGATIVE Final    Comment: (NOTE) The  Xpert Xpress SARS-CoV-2/FLU/RSV plus assay is intended as an aid in the diagnosis of influenza from Nasopharyngeal swab  specimens and should not be used as a sole basis for treatment. Nasal washings and aspirates are unacceptable for Xpert Xpress SARS-CoV-2/FLU/RSV testing.  Fact Sheet for Patients: BloggerCourse.comhttps://www.fda.gov/media/152166/download  Fact Sheet for Healthcare Providers: SeriousBroker.ithttps://www.fda.gov/media/152162/download  This test is not yet approved or cleared by the [place] FDA and has been authorized for detection and/or diagnosis of SARS-CoV-2 by FDA under an Emergency Use Authorization (EUA). This EUA will remain in effect (meaning this test can be used) for the duration of the COVID-19 declaration under Section 564(b)(1) of the Act, 21 U.S.C. section 360bbb-3(b)(1), unless the authorization is terminated or revoked.  Performed at [facility], [address] [place], [place] [address]   Blood Culture (routine x 2)     Status: None (Preliminary result)   Collection Time: 06/05/21  2:07 AM   Specimen: BLOOD RIGHT HAND  Result Value Ref Range Status   Specimen Description BLOOD RIGHT HAND  Final   Special Requests   Final    BOTTLES DRAWN AEROBIC AND ANAEROBIC Blood Culture adequate volume   Culture   Final    NO GROWTH 4 DAYS Performed at [facility], [address] [place], [place] [address]    Report Status PENDING  Incomplete     Medications:    sodium chloride   Intravenous Once   feeding supplement  1 Container Oral TID BM   feeding supplement  237 mL Oral BID BM   folic acid  1 mg Oral Daily   gabapentin  100 mg Oral TID   midodrine  5 mg Oral Q8H   multivitamin with minerals  1 tablet Oral Daily   pantoprazole  40 mg Oral BID   potassium chloride  40 mEq Oral TID   sodium chloride flush  3 mL Intravenous Q12H   thiamine  100 mg Oral Daily   Or   thiamine  100 mg Intravenous Daily   Continuous Infusions:  cefTRIAXone (ROCEPHIN)  IV 2 g (06/09/21 0801)      LOS: 5 days   Marinda ElkAbraham Feliz Ortiz  [facility]  06/09/2021, 8:25 AM

## 2021-06-09 NOTE — Progress Notes (Addendum)
   [facility] Progress Note  CC:  Elevated LFTs, ETOH abuse    Subjective: He is mildly lethargic this morning, answering questions with brief delay. He nearly finished the colonoscopy bowel prep. RN verified he prepped well, passing water per the rectum. He denies having any CP, SOB or palpitations. No N/V. No abdominal pain. No family at the bedside.   ADDENDUM: I just received a call from the patient's RN who stated the patient just passed a very small soft BM.  Stat water enema ordered.  Objective:   EGD 06/09/2021: - LA Grade D reflux esophagitis with no bleeding. Biopsied. - Portal hypertensive gastropathy versus gastritis - Duodenitis.  Vital signs in last 24 hours: Temp:  [97.5 F (36.4 C)-98 F (36.7 C)] 97.7 F (36.5 C) (09/08 0412) Pulse Rate:  [58-79] 65 (09/08 0412) Resp:  [14-28] 19 (09/08 0412) BP: (72-109)/(41-73) 91/59 (09/08 0412) SpO2:  [96 %-99 %] 98 % (09/08 0412) Last BM Date: 06/08/21  General: 55 years-year-old male somewhat somnolent this morning but more arousable after lights turned on. Eyes: Moderate scleral icterus. Heart: Slightly irregular rhythm, no murmurs. Pulm: Breath sounds clear, diminished in the bases. Abdomen: Soft, nondistended.  No obvious ascites.  Nontender.  Positive bowel sounds x 4 quadrants.  No hepatosplenomegaly.  Positive bowel sounds all 4 quadrants. Extremities:  No edema. Neurologic:  Alert and  oriented x 3.  Answers questions appropriately but with delayed response.  Upper extremities mildly tremulous without discrete asterixis.  Moves all extremities. Psych:  Alert and cooperative. Normal mood and affect.  Intake/Output from previous day: 09/07 0701 - 09/08 0700 In: 200 [I.V.:200] Out: 2500 [Urine:2500] Intake/Output this shift: No intake/output data recorded.  Lab Results: Recent Labs    06/07/21 0044 06/08/21 0046 06/09/21 0122  WBC 7.8 7.2 7.4  HGB 8.9* 8.9* 10.0*  HCT 26.4* 26.8* 30.6*   PLT 59* 63* 63*   BMET Recent Labs    06/07/21 0044 06/08/21 0046 06/09/21 0122  NA 142 142 140  K 3.0* 3.2* 3.5  CL 104 108 106  CO2 28 26 26  GLUCOSE 109* 111* 90  BUN 47* 21* 10  CREATININE 1.38* 0.93 0.68  CALCIUM 9.3 9.3 9.3   LFT Recent Labs    06/09/21 0122  PROT 6.3*  ALBUMIN 2.5*  AST 155*  ALT 90*  ALKPHOS 201*  BILITOT 14.7*   PT/INR Recent Labs    06/08/21 0046 06/09/21 0122  LABPROT 15.5* 15.2  INR 1.2 1.2   Hepatitis Panel No results for input(s): HEPBSAG, HCVAB, HEPAIGM, HEPBIGM in the last 72 hours.  No results found.  Assessment / Plan:  1) 55-year-old male admitted to the hospital with alcoholic hepatitis 06/04/2021.  MDF less than 32 therefore prednisolone was not initiated.  Acute hepatitis panel negative. Today Alk phos 201. T. Bili 15.9 -> 14.7. AST 155. ALT 90. INR 1.2. RUQ sono 9/3 identified cholelithiasis without evidence of acute cholecystitis and hepatic steatosis without evidence of cirrhosis or liver lesion.  No ascites. -Ammonia level -Repeat hepatic panel in a.m. -Monitor neuro status closely -No alcohol ever discussed with the patient  2) Anemia. Hg 10.3 on 9/3 -> Hg 5.6 on 9/4 transfused 2 units PRBCs -> Hg 8.8. Today Hg 10. S/P EGD 9/7 showed grade D reflux esophagitis without evidence of bleeding, portal hypertensive gastropathy versus gastritis and duodenitis.  H. pylori antibody level pending.  Hemodynamically stable. -Iron, iron saturation, TIBC, ferritin and B12. -PPI p.o. twice daily x   8 weeks -Sucralfate suspension 1 g p.o. 4 times daily for 4 weeks -Repeat EGD in 8 weeks as outpatient to check for esophagitis healing -Proceed with colonoscopy today with Dr. Dorsey -Continue folate and thiamine -Will transition from CTX to ciprofloxacin to complete a 7 day course of antibiotics due to concern for GI bleed with significant Hb drop  3) Thrombocytopenia likely due to bone marrow suppression from alcohol use disorder. PLT  63.   4) AKI with admission Cr 4.4 treated with octreotide, albumin and midodrine.  Creatinine levels continues to improve. Today Cr 0.68. Octreotide was dc'd on 9/5.    Addendum: As noted above, I received a phone call from the patient's RN who stated the patient just passed a very small soft formed BM.  I ordered a stat water enema and his nurse will call me if he passes any further solid stool.  If he has any further solid stools his colonoscopy will need to be rescheduled tomorrow with additional bowel prep today.  Principal Problem:   Acute liver failure with hepatic coma (HCC) Active Problems:   Alcohol use disorder, severe, dependence (HCC)   Thrombocytopenia (HCC)   Elevated LFTs   Acute renal failure (ARF) (HCC)     LOS: 5 days   Victoriah Wilds M Kennedy-Smith  06/09/2021, 08:47 AM    

## 2021-06-10 ENCOUNTER — Encounter (HOSPITAL_COMMUNITY)
Admission: EM | Disposition: A | Discharge status: Home Health | Payer: Self-pay | Source: Home / Self Care | Attending: Internal Medicine

## 2021-06-10 ENCOUNTER — Encounter (HOSPITAL_COMMUNITY): Payer: Self-pay | Admitting: Internal Medicine

## 2021-06-10 ENCOUNTER — Inpatient Hospital Stay (HOSPITAL_COMMUNITY): Payer: No Typology Code available for payment source | Admitting: Anesthesiology

## 2021-06-10 DIAGNOSIS — F1010 Alcohol abuse, uncomplicated: Secondary | ICD-10-CM | POA: Diagnosis not present

## 2021-06-10 DIAGNOSIS — F1020 Alcohol dependence, uncomplicated: Secondary | ICD-10-CM | POA: Diagnosis not present

## 2021-06-10 DIAGNOSIS — R7989 Other specified abnormal findings of blood chemistry: Secondary | ICD-10-CM | POA: Diagnosis not present

## 2021-06-10 DIAGNOSIS — K635 Polyp of colon: Secondary | ICD-10-CM | POA: Diagnosis not present

## 2021-06-10 DIAGNOSIS — K7201 Acute and subacute hepatic failure with coma: Secondary | ICD-10-CM | POA: Diagnosis not present

## 2021-06-10 HISTORY — PX: BIOPSY: SHX5522

## 2021-06-10 HISTORY — PX: COLONOSCOPY WITH PROPOFOL: SHX5780

## 2021-06-10 LAB — CBC
HCT: 30.5 % — ABNORMAL LOW (ref 39.0–52.0)
Hemoglobin: 10 g/dL — ABNORMAL LOW (ref 13.0–17.0)
MCH: 32.3 pg (ref 26.0–34.0)
MCHC: 32.8 g/dL (ref 30.0–36.0)
MCV: 98.4 fL (ref 80.0–100.0)
Platelets: 73 10*3/uL — ABNORMAL LOW (ref 150–400)
RBC: 3.1 MIL/uL — ABNORMAL LOW (ref 4.22–5.81)
RDW: 18.9 % — ABNORMAL HIGH (ref 11.5–15.5)
WBC: 6.4 10*3/uL (ref 4.0–10.5)
nRBC: 0 % (ref 0.0–0.2)

## 2021-06-10 LAB — PROTIME-INR
INR: 1.1 (ref 0.8–1.2)
INR: 1.2 (ref 0.8–1.2)
Prothrombin Time: 14.3 seconds (ref 11.4–15.2)
Prothrombin Time: 15.5 seconds — ABNORMAL HIGH (ref 11.4–15.2)

## 2021-06-10 LAB — COMPREHENSIVE METABOLIC PANEL
ALT: 105 U/L — ABNORMAL HIGH (ref 0–44)
AST: 175 U/L — ABNORMAL HIGH (ref 15–41)
Albumin: 2.6 g/dL — ABNORMAL LOW (ref 3.5–5.0)
Alkaline Phosphatase: 256 U/L — ABNORMAL HIGH (ref 38–126)
Anion gap: 15 (ref 5–15)
BUN: 6 mg/dL (ref 6–20)
CO2: 22 mmol/L (ref 22–32)
Calcium: 9 mg/dL (ref 8.9–10.3)
Chloride: 100 mmol/L (ref 98–111)
Creatinine, Ser: 0.61 mg/dL (ref 0.61–1.24)
GFR, Estimated: >60 mL/min (ref >60)
Glucose, Bld: 82 mg/dL (ref 70–99)
Potassium: 3.5 mmol/L (ref 3.5–5.1)
Sodium: 137 mmol/L (ref 135–145)
Total Bilirubin: 15.9 mg/dL — ABNORMAL HIGH (ref 0.3–1.2)
Total Protein: 6.8 g/dL (ref 6.5–8.1)

## 2021-06-10 LAB — CULTURE, BLOOD (ROUTINE X 2)
Culture: NO GROWTH
Special Requests: ADEQUATE

## 2021-06-10 LAB — SURGICAL PATHOLOGY

## 2021-06-10 LAB — MAGNESIUM: Magnesium: 1.3 mg/dL — ABNORMAL LOW (ref 1.7–2.4)

## 2021-06-10 SURGERY — COLONOSCOPY WITH PROPOFOL
Anesthesia: Monitor Anesthesia Care

## 2021-06-10 MED ORDER — MAGNESIUM OXIDE -MG SUPPLEMENT 400 (240 MG) MG PO TABS
400.0000 mg | ORAL_TABLET | Freq: Once | ORAL | Status: AC
  Administered 2021-06-10: 400 mg via ORAL
  Filled 2021-06-10: qty 1

## 2021-06-10 MED ORDER — MAGNESIUM SULFATE 2 GM/50ML IV SOLN
2.0000 g | Freq: Once | INTRAVENOUS | Status: AC
  Administered 2021-06-10: 2 g via INTRAVENOUS
  Filled 2021-06-10: qty 50

## 2021-06-10 MED ORDER — PROPOFOL 500 MG/50ML IV EMUL
INTRAVENOUS | Status: DC | PRN
  Administered 2021-06-10: 125 ug/kg/min via INTRAVENOUS
  Administered 2021-06-10: 75 ug/kg/min via INTRAVENOUS

## 2021-06-10 MED ORDER — POTASSIUM CHLORIDE CRYS ER 20 MEQ PO TBCR
40.0000 meq | EXTENDED_RELEASE_TABLET | Freq: Two times a day (BID) | ORAL | Status: AC
  Administered 2021-06-10: 40 meq via ORAL
  Filled 2021-06-10: qty 2

## 2021-06-10 MED ORDER — PHENYLEPHRINE 40 MCG/ML (10ML) SYRINGE FOR IV PUSH (FOR BLOOD PRESSURE SUPPORT)
PREFILLED_SYRINGE | INTRAVENOUS | Status: DC | PRN
  Administered 2021-06-10 (×2): 80 ug via INTRAVENOUS

## 2021-06-10 MED ORDER — LIDOCAINE 2% (20 MG/ML) 5 ML SYRINGE
INTRAMUSCULAR | Status: DC | PRN
  Administered 2021-06-10: 60 mg via INTRAVENOUS

## 2021-06-10 MED ORDER — PROPOFOL 10 MG/ML IV BOLUS
INTRAVENOUS | Status: DC | PRN
  Administered 2021-06-10 (×5): 30 mg via INTRAVENOUS

## 2021-06-10 MED ORDER — POTASSIUM CHLORIDE CRYS ER 20 MEQ PO TBCR
40.0000 meq | EXTENDED_RELEASE_TABLET | Freq: Once | ORAL | Status: AC
  Administered 2021-06-10: 40 meq via ORAL
  Filled 2021-06-10: qty 2

## 2021-06-10 MED ORDER — MAGNESIUM OXIDE -MG SUPPLEMENT 400 (240 MG) MG PO TABS
400.0000 mg | ORAL_TABLET | Freq: Two times a day (BID) | ORAL | Status: AC
  Administered 2021-06-10: 400 mg via ORAL
  Filled 2021-06-10: qty 1

## 2021-06-10 MED ORDER — LACTATED RINGERS IV SOLN: INTRAVENOUS | Status: DC | PRN

## 2021-06-10 SURGICAL SUPPLY — 22 items

## 2021-06-10 NOTE — Progress Notes (Signed)
[facility] PROGRESS NOTE    Progress Note  Armour Villanueva Pouncey  DPO:[id number] DOB: 1966/04/30 DOA: 06/04/2021 [facility]CP: [name]     Brief Narrative:   Dylan Weaver is an 55 y.o. male severe alcohol abuse who presents to the ED for weakness and jaundice on admission on admission history was limited due to his encephalopathy he was found to be in acute liver failure related to alcohol abuse.  On admission he was started on IV fluids after tried, antibiotics and a PPI,, since then octreotide has been discontinued, his hemoglobin dropped to 5.4 he had to be transfused several units of packed red blood cells, his FOBT was positive GI was consulted and is going for colonoscopy and EGD on 06/09/19 2020 showed esophagitis no signs of bleeding, did show portal hypertensive cath gastropathy versus gastritis and duodenitis    Assessment/Plan:   Acute liver failure with hepatic coma (HCC)/acute metabolic encephalopathy/hepatic encephalopathy Likely due to alcohol abuse, now resolved. GI was consulted endoscopy was done Colonoscopy was on 06/10/2021 LFTs continue to be persistently elevated. His meld score on admission was significantly elevated at 33.  Possible GI bleed: Admission hemoglobin was low he was transfused 2 units of packed red blood cells. Hemoglobin has stabilized, for colonoscopy today. EGD showed esophagitis and portal gastropathy.  Acute kidney injury: With a baseline creatinine of 0.7, on admission 4.4. It improved with IV fluid hydration midodrine.   Alcohol use disorder, severe, dependence (HCC) At risk of severe withdrawals. Continue thiamine and folate continue to monitor with CIWA protocol.  Hypokalemia/hypomagnesemia: Borderline To low, replete orally recheck in the morning.  Chronic thrombocytopenia: This morning is 63 likely due to alcohol abuse.  Weakness/deconditioning: Physical therapy evaluated the patient recommended skilled  nursing facility.  DVT prophylaxis: scd Family Communication:none Status is: Inpatient  Remains inpatient appropriate because:Hemodynamically unstable  Dispo: The patient is from: Home              Anticipated d/c is to: SNF              Patient currently is not medically stable to d/c.   Difficult to place patient No     Code Status:     Code Status Orders  (From admission, onward)           Start     Ordered   06/04/21 1953  Full code  Continuous        06/04/21 1955           Code Status History     Date Active Date Inactive Code Status Order ID Comments User Context   02/07/2021 1615 02/08/2021 1608 Full Code [id number]  Legrand Rams ED   11/29/2020 2312 12/04/2020 1705 Full Code [id number]  Briscoe Deutscher, MD ED   09/14/2020 2205 09/24/2020 1514 Full Code [id number]  Eduard Clos, MD ED         IV Access:   Peripheral IV   Procedures and diagnostic studies:   No results found.   Medical Consultants:   None.   Subjective:    Gemma Payor Kaner ready to get this over with.  Objective:    Vitals:   06/10/21 0017 06/10/21 0400 06/10/21 0413 06/10/21 0802  BP: 103/72 109/65  99/68  Pulse: 70 68  61  Resp: 18 15 13 17   Temp: 97.9 F (36.6 C)  97.9 F (36.6 C) 97.7 F (36.5 C)  TempSrc: Oral  Oral Oral  SpO2: 98%  100% 100% 97%  Weight:   71.5 kg   Height:       SpO2: 97 % O2 Flow Rate (L/min): 0 L/min   Intake/Output Summary (Last 24 hours) at 06/10/2021 0825 Last data filed at 06/10/2021 [id number] Gross per 24 hour  Intake 2093.61 ml  Output 1950 ml  Net 143.61 ml    Filed Weights   06/04/21 1600 06/10/21 0413  Weight: 81.6 kg 71.5 kg    Exam: General exam: In no acute distress, persistently jaundiced Respiratory system: Good air movement and clear to auscultation. Cardiovascular system: S1 & S2 heard, RRR. No JVD. Gastrointestinal system: Abdomen is nondistended, soft and nontender.  Extremities: No pedal  edema. Skin: No rashes, lesions or ulcers    Data Reviewed:    Labs: Basic Metabolic Panel: Recent Labs  Lab 06/05/21 0214 06/06/21 0811 06/07/21 0044 06/08/21 0046 06/09/21 0122 06/10/21 0132  NA 141 146* 142 142 140 137  K 3.0* 3.0* 3.0* 3.2* 3.5 3.5  CL 98 109 104 108 106 100  CO2 23 28 28 26 26 22   GLUCOSE 117* 119* 109* 111* 90 82  BUN 121* 72* 47* 21* 10 6  CREATININE 3.69* 1.91* 1.38* 0.93 0.68 0.61  CALCIUM 8.2* 9.2 9.3 9.3 9.3 9.0  MG 2.4 2.1 1.8 1.4* 1.1* 1.3*  PHOS 4.9*  --   --   --   --   --     GFR Estimated Creatinine Clearance: 105.5 mL/min (by C-G formula based on SCr of 0.61 mg/dL). Liver Function Tests: Recent Labs  Lab 06/06/21 0811 06/07/21 0044 06/08/21 0046 06/09/21 0122 06/10/21 0132  AST 107* 125* 151* 155* 175*  ALT 46* 56* 76* 90* 105*  ALKPHOS 90 104 139* 201* 256*  BILITOT 19.5* 18.2* 15.9* 14.7* 15.9*  PROT 6.4* 6.3* 6.1* 6.3* 6.8  ALBUMIN 2.8* 2.8* 2.7* 2.5* 2.6*    No results for input(s): LIPASE, AMYLASE in the last 168 hours. Recent Labs  Lab 06/04/21 1537 06/09/21 0926  AMMONIA 31 31    Coagulation profile Recent Labs  Lab 06/06/21 0811 06/07/21 0044 06/08/21 0046 06/09/21 0122 06/10/21 0132  INR 1.2 1.2 1.2 1.2 1.1    COVID-19 Labs  Recent Labs    06/09/21 0926  FERRITIN 902*    Lab Results  Component Value Date   SARSCOV2NAA NEGATIVE 06/04/2021   SARSCOV2NAA NEGATIVE 03/13/2021   SARSCOV2NAA NEGATIVE 02/07/2021   SARSCOV2NAA NEGATIVE 11/29/2020    CBC: Recent Labs  Lab 06/04/21 1535 06/05/21 1008 06/06/21 0811 06/07/21 0044 06/08/21 0046 06/09/21 0122 06/10/21 0132  WBC 9.4   < > 7.1 7.8 7.2 7.4 6.4  NEUTROABS 7.6  --   --   --   --   --   --   HGB 10.3*   < > 8.8* 8.9* 8.9* 10.0* 10.0*  HCT 31.5*   < > 26.3* 26.4* 26.8* 30.6* 30.5*  MCV 100.3*   < > 94.6 95.3 96.8 97.1 98.4  PLT 131*   < > 62* 59* 63* 63* 73*   < > = values in this interval not displayed.    Cardiac Enzymes: No  results for input(s): CKTOTAL, CKMB, CKMBINDEX, TROPONINI in the last 168 hours. BNP (last 3 results) No results for input(s): PROBNP in the last 8760 hours. CBG: No results for input(s): GLUCAP in the last 168 hours. D-Dimer: No results for input(s): DDIMER in the last 72 hours. Hgb A1c: No results for input(s): HGBA1C in the last 72 hours. Lipid  Profile: No results for input(s): CHOL, HDL, LDLCALC, TRIG, CHOLHDL, LDLDIRECT in the last 72 hours. Thyroid function studies: No results for input(s): TSH, T4TOTAL, T3FREE, THYROIDAB in the last 72 hours.  Invalid input(s): FREET3 Anemia work up: Recent Labs    06/09/21 0926  VITAMINB12 549  FOLATE 8.0  FERRITIN 902*  TIBC 151*  IRON 110  RETICCTPCT 6.1*    Sepsis Labs: Recent Labs  Lab 06/04/21 1535 06/04/21 1735 06/05/21 0214 06/05/21 1008 06/07/21 0044 06/08/21 0046 06/09/21 0122 06/10/21 0132  PROCALCITON  --   --  6.51  --   --   --   --   --   WBC 9.4  --   --    < > 7.8 7.2 7.4 6.4  LATICACIDVEN 4.5* 4.3*  --   --   --   --   --   --    < > = values in this interval not displayed.    Microbiology Recent Results (from the past 240 hour(s))  Blood Culture (routine x 2)     Status: None   Collection Time: 06/04/21  3:35 PM   Specimen: BLOOD RIGHT HAND  Result Value Ref Range Status   Specimen Description BLOOD RIGHT HAND  Final   Special Requests   Final    BOTTLES DRAWN AEROBIC AND ANAEROBIC Blood Culture results may not be optimal due to an inadequate volume of blood received in culture bottles   Culture   Final    NO GROWTH 5 DAYS Performed at [facility] Lab, [address] [place], [place] [address]    Report Status 06/09/2021 FINAL  Final  Urine Culture     Status: None   Collection Time: 06/04/21  3:35 PM   Specimen: In/Out Cath Urine  Result Value Ref Range Status   Specimen Description IN/OUT CATH URINE  Final   Special Requests NONE  Final   Culture   Final    NO GROWTH Performed at [facility], [address] [place], [place] [address]    Report Status 06/06/2021 FINAL  Final  Resp Panel by RT-PCR (Flu A&B, Covid) Nasopharyngeal Swab     Status: None   Collection Time: 06/04/21  9:35 PM   Specimen: Nasopharyngeal Swab; Nasopharyngeal(NP) swabs in vial transport medium  Result Value Ref Range Status   SARS Coronavirus 2 by RT PCR NEGATIVE NEGATIVE Final    Comment: (NOTE) SARS-CoV-2 target nucleic acids are NOT DETECTED.  The SARS-CoV-2 RNA is generally detectable in upper respiratory specimens during the acute phase of infection. The lowest concentration of SARS-CoV-2 viral copies this assay can detect is 138 copies/mL. A negative result does not preclude SARS-Cov-2 infection and should not be used as the sole basis for treatment or other patient management decisions. A negative result may occur with  improper specimen collection/handling, submission of specimen other than nasopharyngeal swab, presence of viral mutation(s) within the areas targeted by this assay, and inadequate number of viral copies(<138 copies/mL). A negative result must be combined with clinical observations, patient history, and epidemiological information. The expected result is Negative.  Fact Sheet for Patients:  BloggerCourse.com  Fact Sheet for Healthcare Providers:  SeriousBroker.it  This test is no t yet approved or cleared by the [place] FDA and  has been authorized for detection and/or diagnosis of SARS-CoV-2 by FDA under an Emergency Use Authorization (EUA). This EUA will remain  in effect (meaning this test can be used) for the duration  of the COVID-19 declaration under Section 564(b)(1) of the Act, 21 U.S.C.section 360bbb-3(b)(1), unless the authorization is terminated  or revoked sooner.       Influenza A by PCR NEGATIVE NEGATIVE Final   Influenza B by PCR NEGATIVE NEGATIVE Final    Comment: (NOTE) The Xpert  Xpress SARS-CoV-2/FLU/RSV plus assay is intended as an aid in the diagnosis of influenza from Nasopharyngeal swab specimens and should not be used as a sole basis for treatment. Nasal washings and aspirates are unacceptable for Xpert Xpress SARS-CoV-2/FLU/RSV testing.  Fact Sheet for Patients: BloggerCourse.com  Fact Sheet for Healthcare Providers: SeriousBroker.it  This test is not yet approved or cleared by the [place] FDA and has been authorized for detection and/or diagnosis of SARS-CoV-2 by FDA under an Emergency Use Authorization (EUA). This EUA will remain in effect (meaning this test can be used) for the duration of the COVID-19 declaration under Section 564(b)(1) of the Act, 21 U.S.C. section 360bbb-3(b)(1), unless the authorization is terminated or revoked.  Performed at [facility] Lab, [address] [place], [place] [address]   Blood Culture (routine x 2)     Status: None (Preliminary result)   Collection Time: 06/05/21  2:07 AM   Specimen: BLOOD RIGHT HAND  Result Value Ref Range Status   Specimen Description BLOOD RIGHT HAND  Final   Special Requests   Final    BOTTLES DRAWN AEROBIC AND ANAEROBIC Blood Culture adequate volume   Culture   Final    NO GROWTH 4 DAYS Performed at [facility] Lab, [address] [place], [place] [address]    Report Status PENDING  Incomplete     Medications:    sodium chloride   Intravenous Once   cefdinir  300 mg Oral Q12H   feeding supplement  1 Container Oral TID BM   feeding supplement  237 mL Oral BID BM   folic acid  1 mg Oral Daily   gabapentin  100 mg Oral TID   midodrine  5 mg Oral Q8H   multivitamin with minerals  1 tablet Oral Daily   pantoprazole  40 mg Oral BID   sodium chloride flush  3 mL Intravenous Q12H   thiamine  100 mg Oral Daily   Or   thiamine  100 mg Intravenous Daily   Continuous Infusions:  sodium chloride 20 mL/hr at 06/10/21 0629       LOS: 6 days   Marinda Elk  [facility]  06/10/2021, 8:25 AM

## 2021-06-10 NOTE — Telephone Encounter (Signed)
Left message on machine to call back  

## 2021-06-10 NOTE — Progress Notes (Signed)
Esophageal biopsies from EGD showed squamous epithelium.

## 2021-06-10 NOTE — Anesthesia Procedure Notes (Signed)
Procedure Name: MAC Date/Time: 06/10/2021 9:35 AM Performed by: Trinna Post., CRNA Pre-anesthesia Checklist: Patient identified, Emergency Drugs available, Suction available, Patient being monitored and Timeout performed Patient Re-evaluated:Patient Re-evaluated prior to induction Oxygen Delivery Method: Nasal cannula Preoxygenation: Pre-oxygenation with 100% oxygen Induction Type: IV induction Placement Confirmation: positive ETCO2

## 2021-06-10 NOTE — TOC Initial Note (Signed)
Transition of Care [facility]) - Initial/Assessment Note    Patient Details  Name: Dylan Weaver MRN: [id number] Date of Birth: 10-12-1965  [facility]) CM/SW Contact:    Eduard Roux, LCSW Phone Number: 06/10/2021, 4:45 PM  Clinical Narrative:                  CSW spoke with patient by phone. CSW introduced self and explained role. CSW discussed PT recommendation for short term rehab.at SNF. Patient reports he lives home alone and is agreeable to short term rehab at [facility]. CSW explained will need to seek approval from the [place] for short term rehab. Patient states he has received 2 vaccines shots, no booster. Patient gave CSW permission to contact his Aunt Oneida Arenas.  CSW return call to Chip Boer- CSW explained the SNF process and possible placement barriers (VA approval, Limited VA SNF choices, ETOH ), she states understanding.   CSW will continue to follow and assist with discharge planning.  Antony Blackbird, MSW, LCSW Clinical Social Worker       Expected Discharge Plan: Skilled Nursing Facility Barriers to Discharge: Inadequate or no insurance, Continued Medical Work up, SNF Pending bed offer, ETOH   Patient Goals and CMS Choice        Expected Discharge Plan and Services Expected Discharge Plan: Skilled Nursing Facility In-house Referral: Clinical Social Work                                            Prior Living Arrangements/Services   Lives with:: Self Patient language and need for interpreter reviewed:: No        Need for Family Participation in Patient Care: Yes (Comment) Care giver support system in place?: No (comment)      Activities of Daily Living Home Assistive Devices/Equipment: Cane (specify quad or straight) ADL Screening (condition at time of admission) Patient's cognitive ability adequate to safely complete daily activities?: Yes Is the patient deaf or have difficulty hearing?: No Does the patient have difficulty  seeing, even when wearing glasses/contacts?: No Does the patient have difficulty concentrating, remembering, or making decisions?: No Patient able to express need for assistance with ADLs?: Yes Does the patient have difficulty dressing or bathing?: No Independently performs ADLs?: No Communication: Independent Dressing (OT): Needs assistance Is this a change from baseline?: Change from baseline, expected to last >3 days Grooming: Needs assistance Is this a change from baseline?: Change from baseline, expected to last >3 days Feeding: Independent Bathing: Needs assistance Is this a change from baseline?: Change from baseline, expected to last >3 days Toileting: Needs assistance Is this a change from baseline?: Change from baseline, expected to last >3days In/Out Bed: Needs assistance Is this a change from baseline?: Change from baseline, expected to last >3 days Walks in Home: Needs assistance Is this a change from baseline?: Change from baseline, expected to last >3 days Does the patient have difficulty walking or climbing stairs?: Yes Weakness of Legs: Both Weakness of Arms/Hands: Both  Permission Sought/Granted Permission sought to share information with : Family Supports Permission granted to share information with : Yes, Verbal Permission Granted  Share Information with NAME: Mateer,Vickie  Permission granted to share info w AGENCY: SNFs  Permission granted to share info w Relationship: Aunt  Permission granted to share info w Contact Information: [phone]  Emotional Assessment  Orientation: : Oriented to Self, Oriented to Place, Oriented to  Time, Oriented to Situation Alcohol / Substance Use: Alcohol Use Psych Involvement: No (comment)  Admission diagnosis:  Alcohol abuse [F10.10] Jaundice [R17] Serum total bilirubin elevated [R17] Acute renal failure (ARF) (HCC) [N17.9] Elevated lactic acid level [R79.89] Acute renal failure, unspecified acute renal failure  type (HCC) [N17.9] Acute liver failure with hepatic coma (HCC) [K72.01] Liver failure without hepatic coma, unspecified chronicity (HCC) [K72.90] Patient Active Problem List   Diagnosis Date Noted   Acute liver failure with hepatic coma (HCC) 06/04/2021   Acute renal failure (ARF) (HCC) 06/04/2021   SIRS (systemic inflammatory response syndrome) (HCC) 11/29/2020   Elevated LFTs 11/29/2020   Intertrigo 11/29/2020   Prolonged QT interval 11/29/2020   Malnutrition of moderate degree 09/16/2020   Alcohol withdrawal (HCC) 09/14/2020   Alcoholic hepatitis 09/14/2020   Thrombocytopenia (HCC) 09/14/2020   Normochromic normocytic anemia 09/14/2020   Alcohol use disorder, severe, dependence (HCC) 06/19/2020   Alcohol abuse with intoxication (HCC) 06/19/2020   PCP:  [name] Pharmacy:   [id number] - [place], [place] - [address] [place] [address] Phone: [phone] Fax: [phone]     Social Determinants of Health (SDOH) Interventions    Readmission Risk Interventions No flowsheet data found.

## 2021-06-10 NOTE — Progress Notes (Signed)
OT Cancellation Note  Patient Details Name: Dylan Weaver MRN: [id number] DOB: 1966/09/04   Cancelled Treatment:    Reason Eval/Treat Not Completed: Patient at procedure or test/ unavailable Pt off unit for colonoscopy. Will follow-up for OT session as schedule permits  Lorre Munroe 06/10/2021, 9:49 AM

## 2021-06-10 NOTE — Anesthesia Postprocedure Evaluation (Signed)
Anesthesia Post Note  Patient: Dylan Weaver  Procedure(s) Performed: COLONOSCOPY WITH PROPOFOL BIOPSY     Patient location during evaluation: Endoscopy Anesthesia Type: MAC Level of consciousness: awake and alert Pain management: pain level controlled Vital Signs Assessment: post-procedure vital signs reviewed and stable Respiratory status: spontaneous breathing, nonlabored ventilation, respiratory function stable and patient connected to nasal cannula oxygen Cardiovascular status: blood pressure returned to baseline and stable Postop Assessment: no apparent nausea or vomiting Anesthetic complications: no   No notable events documented.  Last Vitals:  Vitals:   06/10/21 1023 06/10/21 1032  BP: (!) 95/51   Pulse: 69 64  Resp: (!) 22 18  Temp:    SpO2: 98% 98%    Last Pain:  Vitals:   06/10/21 1023  TempSrc:   PainSc: 0-No pain                 Barnet Glasgow

## 2021-06-10 NOTE — Transfer of Care (Signed)
Immediate Anesthesia Transfer of Care Note  Patient: Dylan Weaver  Procedure(s) Performed: COLONOSCOPY WITH PROPOFOL BIOPSY  Patient Location: PACU and Endoscopy Unit  Anesthesia Type:MAC  Level of Consciousness: drowsy  Airway & Oxygen Therapy: Patient Spontanous Breathing and Patient connected to nasal cannula oxygen  Post-op Assessment: Report given to RN and Post -op Vital signs reviewed and stable  Post vital signs: Reviewed and stable  Last Vitals:  Vitals Value Taken Time  BP    Temp    Pulse    Resp    SpO2      Last Pain:  Vitals:   06/10/21 0909  TempSrc: Temporal  PainSc: 0-No pain      Patients Stated Pain Goal: 0 (18/48/59 2763)  Complications: No notable events documented.

## 2021-06-10 NOTE — Op Note (Signed)
[facility] Patient Name: Elenore Rotaric Cozza Procedure Date : 06/10/2021 MRN: [id number] Attending MD: Lynann Bolognaajesh Mckynleigh Mussell , MD Date of Birth: [birth year] CSN: [id number] Age: [id number] Admit Type: Inpatient Procedure:                Colonoscopy Indications:              Unexplained iron deficiency anemia Providers:                Lynann Bolognaajesh Skanda Worlds, MD, Fayrene FearingLaura Turner, RN, Michele McalpineErik Holloway                            Technician Referring MD:              Medicines:                Monitored Anesthesia Care Complications:            No immediate complications. Estimated Blood Loss:     Estimated blood loss: none. Procedure:                Pre-Anesthesia Assessment:                           - Prior to the procedure, a History and Physical                            was performed, and patient medications and                            allergies were reviewed. The patient's tolerance of                            previous anesthesia was also reviewed. The risks                            and benefits of the procedure and the sedation                            options and risks were discussed with the patient.                            All questions were answered, and informed consent                            was obtained. Prior Anticoagulants: The patient has                            taken no previous anticoagulant or antiplatelet                            agents. ASA Grade Assessment: III - A patient with                            severe systemic disease. After reviewing the risks  and benefits, the patient was deemed in                            satisfactory condition to undergo the procedure.                           After obtaining informed consent, the colonoscope                            was passed under direct vision. Throughout the                            procedure, the patient's blood pressure, pulse, and                            oxygen saturations were  monitored continuously. The                            PCF-190TL ([id number]) Olympus colonoscope was                            introduced through the anus and advanced to the the                            cecum, identified by appendiceal orifice and                            ileocecal valve. The colonoscopy was performed                            without difficulty. The patient tolerated the                            procedure well. The quality of the bowel                            preparation was adequate to identify polyps. The                            ileocecal valve, appendiceal orifice, and rectum                            were photographed. Scope In: 9:45:53 AM Scope Out: 10:08:01 AM Scope Withdrawal Time: 0 hours 10 minutes 39 seconds  Total Procedure Duration: 0 hours 22 minutes 8 seconds  Findings:      Retained stool still present despite 2-day prep      15-20 sessile and pedunculated polyps were found in 15 cm length of       colon involving distal transverse colon, splenic flexure and proximal       descending colon. The polyps were varying sizes between 2 to 15 mm in       size. These had appearance of pseudopolyps. No obvious ischemic colitis.       Multiple biopsies were taken with a cold forceps for histology.  A few small-mouthed diverticula were found in the sigmoid colon.      Non-bleeding internal hemorrhoids were found during retroflexion. The       hemorrhoids were small. Impression:               - Multiple polyps in 15 cm length of colon                            involving distal transverse, splenic flexure and                            proximal descending colon. Likely pseudopolyps.                            Biopsied.                           - Mild sigmoid diverticulosis.                           - Non-bleeding internal hemorrhoids.                           - Limited prep despite 2-day prep Recommendation:           - Return patient to  hospital ward for ongoing care.                           - Resume previous diet.                           - Continue present medications.                           - Await pathology results.                           - Repeat colonoscopy in 3-6 months with 2 day or 3                            day prep as outpatient. This can be coordinated                            with EGD. I will get in touch with Dr. Christella Hartigan                            regarding the same. I think it will be worthwhile                            to see him in the clinic prior to endoscopic                            procedures to make sure his jaundice has resolved.                           - The findings and recommendations were  discussed                            with the patient.                           - From GI standpoint, he can go home and follow-up                            in 4-6 weeks.                           PS: I have sent message to Hilma Favors RN Procedure Code(s):        --- Professional ---                           [phone], Colonoscopy, flexible; with biopsy, single                            or multiple Diagnosis Code(s):        --- Professional ---                           K63.5, Polyp of colon                           K64.8, Other hemorrhoids                           D50.9, Iron deficiency anemia, unspecified                           K57.30, Diverticulosis of large intestine without                            perforation or abscess without bleeding CPT copyright 2019 [employer] Association. All rights reserved. The codes documented in this report are preliminary and upon coder review may  be revised to meet current compliance requirements. Lynann Bologna, MD 06/10/2021 10:23:18 AM This report has been signed electronically. Number of Addenda: 0

## 2021-06-10 NOTE — Interval H&P Note (Signed)
History and Physical Interval Note:  06/10/2021 9:04 AM  Dylan Weaver  has presented today for surgery, with the diagnosis of Anemia.  The various methods of treatment have been discussed with the patient and family. After consideration of risks, benefits and other options for treatment, the patient has consented to  Procedure(s): COLONOSCOPY WITH PROPOFOL (N/A) as a surgical intervention.  The patient's history has been reviewed, patient examined, no change in status, stable for surgery.  I have reviewed the patient's chart and labs.  Questions were answered to the patient's satisfaction.     Lynann Bologna

## 2021-06-10 NOTE — Progress Notes (Signed)
Multiple incontinent stools. All stools clear and no solid stool noted. Bathed, pericare provided, and barrier cream applied often.

## 2021-06-11 ENCOUNTER — Encounter (HOSPITAL_COMMUNITY): Payer: Self-pay | Admitting: Gastroenterology

## 2021-06-11 DIAGNOSIS — F1020 Alcohol dependence, uncomplicated: Secondary | ICD-10-CM | POA: Diagnosis not present

## 2021-06-11 DIAGNOSIS — K7201 Acute and subacute hepatic failure with coma: Secondary | ICD-10-CM | POA: Diagnosis not present

## 2021-06-11 DIAGNOSIS — R7989 Other specified abnormal findings of blood chemistry: Secondary | ICD-10-CM | POA: Diagnosis not present

## 2021-06-11 DIAGNOSIS — F1010 Alcohol abuse, uncomplicated: Secondary | ICD-10-CM | POA: Diagnosis not present

## 2021-06-11 LAB — HEPATIC FUNCTION PANEL
ALT: 92 U/L — ABNORMAL HIGH (ref 0–44)
AST: 142 U/L — ABNORMAL HIGH (ref 15–41)
Albumin: 2.2 g/dL — ABNORMAL LOW (ref 3.5–5.0)
Alkaline Phosphatase: 241 U/L — ABNORMAL HIGH (ref 38–126)
Bilirubin, Direct: 8.4 mg/dL — ABNORMAL HIGH (ref 0.0–0.2)
Indirect Bilirubin: 5.2 mg/dL — ABNORMAL HIGH (ref 0.3–0.9)
Total Bilirubin: 13.6 mg/dL — ABNORMAL HIGH (ref 0.3–1.2)
Total Protein: 6.1 g/dL — ABNORMAL LOW (ref 6.5–8.1)

## 2021-06-11 LAB — BASIC METABOLIC PANEL
Anion gap: 11 (ref 5–15)
BUN: 6 mg/dL (ref 6–20)
CO2: 20 mmol/L — ABNORMAL LOW (ref 22–32)
Calcium: 8.2 mg/dL — ABNORMAL LOW (ref 8.9–10.3)
Chloride: 101 mmol/L (ref 98–111)
Creatinine, Ser: 0.76 mg/dL (ref 0.61–1.24)
GFR, Estimated: >60 mL/min (ref >60)
Glucose, Bld: 104 mg/dL — ABNORMAL HIGH (ref 70–99)
Potassium: 3.8 mmol/L (ref 3.5–5.1)
Sodium: 132 mmol/L — ABNORMAL LOW (ref 135–145)

## 2021-06-11 LAB — PHOSPHORUS: Phosphorus: 2.4 mg/dL — ABNORMAL LOW (ref 2.5–4.6)

## 2021-06-11 LAB — MAGNESIUM
Magnesium: 1.2 mg/dL — ABNORMAL LOW (ref 1.7–2.4)
Magnesium: 1.2 mg/dL — ABNORMAL LOW (ref 1.7–2.4)

## 2021-06-11 MED ORDER — MAGNESIUM SULFATE 2 GM/50ML IV SOLN
2.0000 g | Freq: Once | INTRAVENOUS | Status: AC
  Administered 2021-06-11: 2 g via INTRAVENOUS
  Filled 2021-06-11: qty 50

## 2021-06-11 MED ORDER — MAGNESIUM OXIDE -MG SUPPLEMENT 400 (240 MG) MG PO TABS
400.0000 mg | ORAL_TABLET | Freq: Three times a day (TID) | ORAL | Status: AC
  Administered 2021-06-11 (×3): 400 mg via ORAL
  Filled 2021-06-11 (×3): qty 1

## 2021-06-11 NOTE — Progress Notes (Addendum)
[facility] PROGRESS NOTE    Progress Note  Dylan Weaver  KDT:[id number] DOB: 17-May-1966 DOA: 06/04/2021 PCP: [name]     Brief Narrative:   Dylan Weaver is an 55 y.o. male severe alcohol abuse who presents to the ED for weakness and jaundice on admission on admission history was limited due to his encephalopathy he was found to be in acute liver failure related to alcohol abuse.  On admission he was started on IV fluids after tried, antibiotics and a PPI,, since then octreotide has been discontinued, his hemoglobin dropped to 5.4 he had to be transfused several units of packed red blood cells, his FOBT was positive GI was consulted and is going for colonoscopy and EGD on 06/09/19 2020 showed esophagitis no signs of bleeding, did show portal hypertensive cath gastropathy versus gastritis and duodenitis    Assessment/Plan:   Acute liver failure with hepatic coma (HCC)/acute metabolic encephalopathy/hepatic encephalopathy Likely due to alcohol abuse, now resolved. LFT's continue to be persistently elevated. His meld score on admission was significantly elevated at 33. Awaiting physical therapy evaluation may need skilled nursing facility.  Possible GI bleed: Admission hemoglobin was low he was transfused 2 units of packed red blood cells. Hemoglobin has stabilized. GI was consulted endoscopy was done biopsy sent. Colonoscopy was on 06/10/2021 multiple polyps mild diverticulosis nonbleeding internal hemorrhoids.  Acute kidney injury: With a baseline creatinine of 0.7, on admission 4.4. It improved with IV fluid hydration midodrine.   Alcohol use disorder, severe, dependence (HCC) At risk of severe withdrawals. Continue thiamine and folate continue to monitor with CIWA protocol.  Hypokalemia/hypomagnesemia: Magnesium continues to be low replete IV and orally recheck in the morning. Nurse to document if he is taking his magnesium  orally.  Chronic thrombocytopenia: This morning is 63 likely due to alcohol abuse.  Weakness/deconditioning: Physical therapy evaluated the patient recommended skilled nursing facility.  DVT prophylaxis: scd Family Communication:none Status is: Inpatient  Remains inpatient appropriate because:Hemodynamically unstable  Dispo: The patient is from: Home              Anticipated d/c is to: SNF              Patient currently is not medically stable to d/c.   Difficult to place patient No     Code Status:     Code Status Orders  (From admission, onward)           Start     Ordered   06/04/21 1953  Full code  Continuous        06/04/21 1955           Code Status History     Date Active Date Inactive Code Status Order ID Comments User Context   02/07/2021 1615 02/08/2021 1608 Full Code [id number]  Legrand Rams ED   11/29/2020 2312 12/04/2020 1705 Full Code [id number]  Briscoe Deutscher, MD ED   09/14/2020 2205 09/24/2020 1514 Full Code [id number]  Eduard Clos, MD ED         IV Access:   Peripheral IV   Procedures and diagnostic studies:   No results found.   Medical Consultants:   None.   Subjective:    Dylan Weaver no complaints tolerated her breakfast this morning.  Objective:    Vitals:   06/10/21 2006 06/11/21 0000 06/11/21 0400 06/11/21 0808  BP:  93/62  100/66  Pulse: 90 82 72 71  Resp:  19  20  Temp: 98.4 F (36.9 C) 98.5 F (36.9 C)  97.7 F (36.5 C)  TempSrc: Axillary Oral  Oral  SpO2: 99% 98%    Weight:      Height:       SpO2: 98 % O2 Flow Rate (L/min): 0 L/min   Intake/Output Summary (Last 24 hours) at 06/11/2021 0851 Last data filed at 06/11/2021 0640 Gross per 24 hour  Intake 1267 ml  Output 650 ml  Net 617 ml    Filed Weights   06/04/21 1600 06/10/21 0413  Weight: 81.6 kg 71.5 kg    Exam: General exam: In no acute distress. Respiratory system: Good air movement and clear to  auscultation. Cardiovascular system: S1 & S2 heard, RRR. No JVD. Gastrointestinal system: Abdomen is nondistended, soft and nontender.  Extremities: No pedal edema. Skin: No rashes, lesions or ulcers Psychiatry: Judgement and insight appear normal. Mood & affect appropriate.  Data Reviewed:    Labs: Basic Metabolic Panel: Recent Labs  Lab 06/05/21 0214 06/06/21 2778 06/07/21 0044 06/08/21 0046 06/09/21 0122 06/10/21 0132 06/11/21 0136  NA 141   < > 142 142 140 137 132*  K 3.0*   < > 3.0* 3.2* 3.5 3.5 3.8  CL 98   < > 104 108 106 100 101  CO2 23   < > 28 26 26 22  20*  GLUCOSE 117*   < > 109* 111* 90 82 104*  BUN 121*   < > 47* 21* 10 6 6   CREATININE 3.69*   < > 1.38* 0.93 0.68 0.61 0.76  CALCIUM 8.2*   < > 9.3 9.3 9.3 9.0 8.2*  MG 2.4   < > 1.8 1.4* 1.1* 1.3* 1.2*  PHOS 4.9*  --   --   --   --   --   --    < > = values in this interval not displayed.    GFR Estimated Creatinine Clearance: 105.5 mL/min (by C-G formula based on SCr of 0.76 mg/dL). Liver Function Tests: Recent Labs  Lab 06/06/21 0811 06/07/21 0044 06/08/21 0046 06/09/21 0122 06/10/21 0132  AST 107* 125* 151* 155* 175*  ALT 46* 56* 76* 90* 105*  ALKPHOS 90 104 139* 201* 256*  BILITOT 19.5* 18.2* 15.9* 14.7* 15.9*  PROT 6.4* 6.3* 6.1* 6.3* 6.8  ALBUMIN 2.8* 2.8* 2.7* 2.5* 2.6*    No results for input(s): LIPASE, AMYLASE in the last 168 hours. Recent Labs  Lab 06/04/21 1537 06/09/21 0926  AMMONIA 31 31    Coagulation profile Recent Labs  Lab 06/07/21 0044 06/08/21 0046 06/09/21 0122 06/10/21 0132 06/10/21 1125  INR 1.2 1.2 1.2 1.1 1.2    COVID-19 Labs  Recent Labs    06/09/21 0926  FERRITIN 902*     Lab Results  Component Value Date   SARSCOV2NAA NEGATIVE 06/04/2021   SARSCOV2NAA NEGATIVE 03/13/2021   SARSCOV2NAA NEGATIVE 02/07/2021   SARSCOV2NAA NEGATIVE 11/29/2020    CBC: Recent Labs  Lab 06/04/21 1535 06/05/21 1008 06/06/21 0811 06/07/21 0044 06/08/21 0046  06/09/21 0122 06/10/21 0132  WBC 9.4   < > 7.1 7.8 7.2 7.4 6.4  NEUTROABS 7.6  --   --   --   --   --   --   HGB 10.3*   < > 8.8* 8.9* 8.9* 10.0* 10.0*  HCT 31.5*   < > 26.3* 26.4* 26.8* 30.6* 30.5*  MCV 100.3*   < > 94.6 95.3 96.8 97.1 98.4  PLT 131*   < > 62* 59*  63* 63* 73*   < > = values in this interval not displayed.    Cardiac Enzymes: No results for input(s): CKTOTAL, CKMB, CKMBINDEX, TROPONINI in the last 168 hours. BNP (last 3 results) No results for input(s): PROBNP in the last 8760 hours. CBG: No results for input(s): GLUCAP in the last 168 hours. D-Dimer: No results for input(s): DDIMER in the last 72 hours. Hgb A1c: No results for input(s): HGBA1C in the last 72 hours. Lipid Profile: No results for input(s): CHOL, HDL, LDLCALC, TRIG, CHOLHDL, LDLDIRECT in the last 72 hours. Thyroid function studies: No results for input(s): TSH, T4TOTAL, T3FREE, THYROIDAB in the last 72 hours.  Invalid input(s): FREET3 Anemia work up: Recent Labs    06/09/21 0926  VITAMINB12 549  FOLATE 8.0  FERRITIN 902*  TIBC 151*  IRON 110  RETICCTPCT 6.1*    Sepsis Labs: Recent Labs  Lab 06/04/21 1535 06/04/21 1735 06/05/21 0214 06/05/21 1008 06/07/21 0044 06/08/21 0046 06/09/21 0122 06/10/21 0132  PROCALCITON  --   --  6.51  --   --   --   --   --   WBC 9.4  --   --    < > 7.8 7.2 7.4 6.4  LATICACIDVEN 4.5* 4.3*  --   --   --   --   --   --    < > = values in this interval not displayed.    Microbiology Recent Results (from the past 240 hour(s))  Blood Culture (routine x 2)     Status: None   Collection Time: 06/04/21  3:35 PM   Specimen: BLOOD RIGHT HAND  Result Value Ref Range Status   Specimen Description BLOOD RIGHT HAND  Final   Special Requests   Final    BOTTLES DRAWN AEROBIC AND ANAEROBIC Blood Culture results may not be optimal due to an inadequate volume of blood received in culture bottles   Culture   Final    NO GROWTH 5 DAYS Performed at [facility], [address] [place], [place] [address]    Report Status 06/09/2021 FINAL  Final  Urine Culture     Status: None   Collection Time: 06/04/21  3:35 PM   Specimen: In/Out Cath Urine  Result Value Ref Range Status   Specimen Description IN/OUT CATH URINE  Final   Special Requests NONE  Final   Culture   Final    NO GROWTH Performed at [facility], [address], [place], [place] [address]    Report Status 06/06/2021 FINAL  Final  Resp Panel by RT-PCR (Flu A&B, Covid) Nasopharyngeal Swab     Status: None   Collection Time: 06/04/21  9:35 PM   Specimen: Nasopharyngeal Swab; Nasopharyngeal(NP) swabs in vial transport medium  Result Value Ref Range Status   SARS Coronavirus 2 by RT PCR NEGATIVE NEGATIVE Final    Comment: (NOTE) SARS-CoV-2 target nucleic acids are NOT DETECTED.  The SARS-CoV-2 RNA is generally detectable in upper respiratory specimens during the acute phase of infection. The lowest concentration of SARS-CoV-2 viral copies this assay can detect is 138 copies/mL. A negative result does not preclude SARS-Cov-2 infection and should not be used as the sole basis for treatment or other patient management decisions. A negative result may occur with  improper specimen collection/handling, submission of specimen other than nasopharyngeal swab, presence of viral mutation(s) within the areas targeted by this assay, and inadequate number of viral copies(<138 copies/mL). A negative result  must be combined with clinical observations, patient history, and epidemiological information. The expected result is Negative.  Fact Sheet for Patients:  BloggerCourse.comhttps://www.fda.gov/media/152166/download  Fact Sheet for Healthcare Providers:  SeriousBroker.ithttps://www.fda.gov/media/152162/download  This test is no t yet approved or cleared by the [place] FDA and  has been authorized for detection and/or diagnosis of SARS-CoV-2 by FDA under an Emergency Use Authorization (EUA). This EUA  will remain  in effect (meaning this test can be used) for the duration of the COVID-19 declaration under Section 564(b)(1) of the Act, 21 U.S.C.section 360bbb-3(b)(1), unless the authorization is terminated  or revoked sooner.       Influenza A by PCR NEGATIVE NEGATIVE Final   Influenza B by PCR NEGATIVE NEGATIVE Final    Comment: (NOTE) The Xpert Xpress SARS-CoV-2/FLU/RSV plus assay is intended as an aid in the diagnosis of influenza from Nasopharyngeal swab specimens and should not be used as a sole basis for treatment. Nasal washings and aspirates are unacceptable for Xpert Xpress SARS-CoV-2/FLU/RSV testing.  Fact Sheet for Patients: BloggerCourse.comhttps://www.fda.gov/media/152166/download  Fact Sheet for Healthcare Providers: SeriousBroker.ithttps://www.fda.gov/media/152162/download  This test is not yet approved or cleared by the [place] FDA and has been authorized for detection and/or diagnosis of SARS-CoV-2 by FDA under an Emergency Use Authorization (EUA). This EUA will remain in effect (meaning this test can be used) for the duration of the COVID-19 declaration under Section 564(b)(1) of the Act, 21 U.S.C. section 360bbb-3(b)(1), unless the authorization is terminated or revoked.  Performed at [facility], [address] [place], [place] [address]   Blood Culture (routine x 2)     Status: None   Collection Time: 06/05/21  2:07 AM   Specimen: BLOOD RIGHT HAND  Result Value Ref Range Status   Specimen Description BLOOD RIGHT HAND  Final   Special Requests   Final    BOTTLES DRAWN AEROBIC AND ANAEROBIC Blood Culture adequate volume   Culture   Final    NO GROWTH 5 DAYS Performed at [facility], [address] [place], [place] [address]    Report Status 06/10/2021 FINAL  Final     Medications:    sodium chloride   Intravenous Once   cefdinir  300 mg Oral Q12H   feeding supplement  1 Container Oral TID BM   feeding supplement  237 mL Oral BID BM   folic acid  1 mg  Oral Daily   gabapentin  100 mg Oral TID   midodrine  5 mg Oral Q8H   multivitamin with minerals  1 tablet Oral Daily   pantoprazole  40 mg Oral BID   sodium chloride flush  3 mL Intravenous Q12H   thiamine  100 mg Oral Daily   Or   thiamine  100 mg Intravenous Daily   Continuous Infusions:      LOS: 7 days   Marinda ElkAbraham Feliz Ortiz  [facility]  06/11/2021, 8:51 AM

## 2021-06-11 NOTE — Progress Notes (Signed)
Occupational Therapy Treatment Patient Details Name: Dylan Weaver MRN: [id number] DOB: Dec 14, 1965 Today's Date: 06/11/2021    History of present illness Pt is a 55yo male presenting to [facility] ED on 9/3 with complaints of weakness and jaundice. Found to be in acute liver failure with hepatic coma (encephalopathy). Chest xray showed possible L atelectasis. s/p colonoscopy on 9/9, finding multiple polyps in 15 cm length of colon and involving distal transverse, splenic flexure and proximal descending colon; mild diverticulosis. PMH: alcohol abuse   OT comments  Pt seen in conjunction with PT to maximize pts activity tolerance and optimize pts participation. Pt continues to present with impaired cognition, generalized deconditioning and impaired strength and coordination. Pt currently requires MOD A +2 for sit<>stand from EOB with RW x2, pt unable to take steps this session. Pt completes UB ADLS with set- up assist and LB ADLs from bed level with set- up assist. Pt would continue to benefit from skilled occupational therapy while admitted and after d/c to address the below listed limitations in order to improve overall functional mobility and facilitate independence with BADL participation. DC plan remains appropriate, will follow acutely per POC.    Follow Up Recommendations  SNF;Supervision/Assistance - 24 hour    Equipment Recommendations  Other (comment) (RW)    Recommendations for Other Services      Precautions / Restrictions Precautions Precautions: Fall Precaution Comments: soft BP Restrictions Weight Bearing Restrictions: No       Mobility Bed Mobility Overal bed mobility: Needs Assistance Bed Mobility: Supine to Sit;Sit to Supine     Supine to sit: Mod assist;HOB elevated;+2 for safety/equipment Sit to supine: Min assist;HOB elevated;+2 for safety/equipment   General bed mobility comments: min-mod assist +2 safety for supine<>sit for trunk elevation, LE lifting back  into bed, boost up in bed, and lines/leads.    Transfers Overall transfer level: Needs assistance Equipment used: Rolling walker (2 wheeled) Transfers: Sit to/from Stand;Lateral/Scoot Transfers Sit to Stand: Mod assist;+2 physical assistance;+2 safety/equipment        Lateral/Scoot Transfers: Mod assist;+2 physical assistance;+2 safety/equipment General transfer comment: Initially mod assist +2 for safety for initial power up, rise, hip extension to upright, and steadying. On second stand attempt, pt requiring mod +2 physical assist for boost and rise. Pt with x2 anterior steps, VERY short step length and ataxic appearing, requesting sit down. Pt declined further transfer attempts. Lateral scooting towards HOB requiring mod +2 to translate hips with bed pad, x4 scoots.    Balance Overall balance assessment: Needs assistance Sitting-balance support: Bilateral upper extremity supported;Feet supported Sitting balance-Leahy Scale: Fair Sitting balance - Comments: fair static sitting EOB   Standing balance support: Bilateral upper extremity supported;During functional activity Standing balance-Leahy Scale: Poor Standing balance comment: reliant on external support of RW and PT/COTA                           ADL either performed or assessed with clinical judgement   ADL Overall ADL's : Needs assistance/impaired     Grooming: Brushing hair;Sitting;Set up Grooming Details (indicate cue type and reason): sitting EOB         Upper Body Dressing : Minimal assistance;Sitting Upper Body Dressing Details (indicate cue type and reason): to don posterior gown Lower Body Dressing: Set up;Bed level Lower Body Dressing Details (indicate cue type and reason): pt able to don socks from figure four in bed with set- up assist Toilet Transfer: Moderate assistance;+2 for physical  assistance Toilet Transfer Details (indicate cue type and reason): sit<>stand only from EOB, unable to take  steps         Functional mobility during ADLs: Moderate assistance;+2 for physical assistance;Rolling walker (sit<>stand only) General ADL Comments: pt continues to present with impaired cogntion, and decreased strength and coordination     Vision       Perception     Praxis      Cognition Arousal/Alertness: Awake/alert Behavior During Therapy: Flat affect Overall Cognitive Status: No family/caregiver present to determine baseline cognitive functioning Area of Impairment: Orientation;Following commands;Safety/judgement;Problem solving;Memory                 Orientation Level: Disoriented to;Situation;Place   Memory: Decreased short-term memory (forgot pt had eaten breakfast this AM) Following Commands: Follows one step commands with increased time Safety/Judgement: Decreased awareness of safety;Decreased awareness of deficits   Problem Solving: Difficulty sequencing;Requires verbal cues;Requires tactile cues General Comments: Pt goes in and out of being oriented to place and situation, at times seems oriented but then states "am I flying out tomorrow?" and "it's so nice of yall to let me stay here" (unaware he is in hospital). Pt can be impulsive, requires cues to wait for assist.        Exercises    Shoulder Instructions       General Comments VSS on RA    Pertinent Vitals/ Pain       Pain Assessment: Faces Faces Pain Scale: No hurt Pain Intervention(s): Monitored during session  Home Living                                          Prior Functioning/Environment              Frequency  Min 2X/week        Progress Toward Goals  OT Goals(current goals can now be found in the care plan section)  Progress towards OT goals: Progressing toward goals  Acute Rehab OT Goals Patient Stated Goal: to fly out of here OT Goal Formulation: With patient Time For Goal Achievement: 06/21/21 Potential to Achieve Goals: Fair  Plan  Discharge plan remains appropriate;Frequency remains appropriate    Co-evaluation    PT/OT/SLP Co-Evaluation/Treatment: Yes Reason for Co-Treatment: For patient/therapist safety;To address functional/ADL transfers;Necessary to address cognition/behavior during functional activity PT goals addressed during session: Mobility/safety with mobility;Balance;Proper use of DME;Strengthening/ROM OT goals addressed during session: ADL's and self-care      AM-PAC OT "6 Clicks" Daily Activity     Outcome Measure   Help from another person eating meals?: None Help from another person taking care of personal grooming?: A Little Help from another person toileting, which includes using toliet, bedpan, or urinal?: A Lot Help from another person bathing (including washing, rinsing, drying)?: A Lot Help from another person to put on and taking off regular upper body clothing?: A Little Help from another person to put on and taking off regular lower body clothing?: A Lot 6 Click Score: 16    End of Session Equipment Utilized During Treatment: Gait belt;Rolling walker  OT Visit Diagnosis: Unsteadiness on feet (R26.81);Other abnormalities of gait and mobility (R26.89);Muscle weakness (generalized) (M62.81);Other symptoms and signs involving cognitive function   Activity Tolerance Patient tolerated treatment well   Patient Left in bed;with call bell/phone within reach;with bed alarm set   Nurse Communication Mobility status  Time: 5277-8242 OT Time Calculation (min): 27 min  Charges: OT General Charges $OT Visit: 1 Visit OT Treatments $Self Care/Home Management : 8-22 mins  Lenor Derrick., COTA/L Acute Rehabilitation Services [phone]    Barron Schmid 06/11/2021, 3:10 PM

## 2021-06-11 NOTE — Progress Notes (Signed)
Physical Therapy Treatment Patient Details Name: Dylan Weaver MRN: [id number] DOB: June 01, 1966 Today's Date: 06/11/2021    History of Present Illness Pt is a 55yo male presenting to [facility] ED on 9/3 with complaints of weakness and jaundice. Found to be in acute liver failure with hepatic coma (encephalopathy). Chest xray showed possible L atelectasis. s/p colonoscopy on 9/9, finding multiple polyps in 15 cm length of colon and involving distal transverse, splenic flexure and proximal descending colon; mild diverticulosis. PMH: alcohol abuse    PT Comments    Pt agreeable to PT/OT session focused on functional strength and mobility. Pt requiring mod +2 assist for bed mobility and repeated transfer to standing, pt attempting gait but very unsteady/ataxic and weak requesting return to bed. PT instructed pt in LE exercises that pt can perform independently, pt expresses understanding. SNF remains appropriate, will continue to follow.     Follow Up Recommendations  SNF;Supervision/Assistance - 24 hour     Equipment Recommendations  Rolling walker with 5" wheels;3in1 (PT);Wheelchair cushion (measurements PT);Wheelchair (measurements PT)    Recommendations for Other Services       Precautions / Restrictions Precautions Precautions: Fall Precaution Comments: soft BP Restrictions Weight Bearing Restrictions: No    Mobility  Bed Mobility Overal bed mobility: Needs Assistance Bed Mobility: Supine to Sit;Sit to Supine     Supine to sit: Mod assist;HOB elevated;+2 for safety/equipment Sit to supine: Min assist;HOB elevated;+2 for safety/equipment   General bed mobility comments: min-mod assist +2 safety for supine<>sit for trunk elevation, LE lifting back into bed, boost up in bed, and lines/leads.    Transfers Overall transfer level: Needs assistance Equipment used: Rolling walker (2 wheeled) Transfers: Sit to/from Stand;Lateral/Scoot Transfers Sit to Stand: Mod assist;+2  physical assistance;+2 safety/equipment        Lateral/Scoot Transfers: Mod assist;+2 physical assistance;+2 safety/equipment General transfer comment: Initially mod assist +2 for safety for initial power up, rise, hip extension to upright, and steadying. On second stand attempt, pt requiring mod +2 physical assist for boost and rise. Pt with x2 anterior steps, VERY short step length and ataxic appearing, requesting sit down. Pt declined further transfer attempts. Lateral scooting towards HOB requiring mod +2 to translate hips with bed pad, x4 scoots.  Ambulation/Gait             General Gait Details: 2 small steps only, ataxic and knees flexed   Stairs             Wheelchair Mobility    Modified Rankin (Stroke Patients Only)       Balance Overall balance assessment: Needs assistance Sitting-balance support: Bilateral upper extremity supported;Feet supported Sitting balance-Leahy Scale: Fair Sitting balance - Comments: fair static sitting EOB   Standing balance support: Bilateral upper extremity supported;During functional activity Standing balance-Leahy Scale: Poor Standing balance comment: reliant on external support of RW and PT/COTA                            Cognition Arousal/Alertness: Awake/alert Behavior During Therapy: Flat affect Overall Cognitive Status: No family/caregiver present to determine baseline cognitive functioning Area of Impairment: Orientation;Following commands;Safety/judgement;Problem solving                 Orientation Level: Disoriented to;Situation;Place     Following Commands: Follows one step commands with increased time Safety/Judgement: Decreased awareness of safety;Decreased awareness of deficits   Problem Solving: Difficulty sequencing;Requires verbal cues;Requires tactile cues General Comments: Pt goes in  and out of being oriented to place and situation, at times seems oriented but then states "am I flying  out tomorrow?" and "it's so nice of yall to let me stay here" (unaware he is in hospital). Pt can be impulsive, requires cues to wait for assist.      Exercises General Exercises - Lower Extremity Short Arc Quad: AROM;Both;10 reps;Supine Hip ABduction/ADduction: AROM;Both;10 reps;Supine    General Comments General comments (skin integrity, edema, etc.): vss      Pertinent Vitals/Pain Pain Assessment: Faces Faces Pain Scale: No hurt Pain Intervention(s): Monitored during session    Home Living                      Prior Function            PT Goals (current goals can now be found in the care plan section) Acute Rehab PT Goals Patient Stated Goal: to get his strength back PT Goal Formulation: With patient Time For Goal Achievement: 06/20/21 Potential to Achieve Goals: Good Progress towards PT goals: Progressing toward goals    Frequency    Min 2X/week      PT Plan Current plan remains appropriate    Co-evaluation PT/OT/SLP Co-Evaluation/Treatment: Yes Reason for Co-Treatment: For patient/therapist safety;To address functional/ADL transfers;Necessary to address cognition/behavior during functional activity PT goals addressed during session: Mobility/safety with mobility;Balance;Proper use of DME;Strengthening/ROM        AM-PAC PT "6 Clicks" Mobility   Outcome Measure  Help needed turning from your back to your side while in a flat bed without using bedrails?: A Little Help needed moving from lying on your back to sitting on the side of a flat bed without using bedrails?: A Lot Help needed moving to and from a bed to a chair (including a wheelchair)?: A Lot Help needed standing up from a chair using your arms (e.g., wheelchair or bedside chair)?: A Lot Help needed to walk in hospital room?: Total Help needed climbing 3-5 steps with a railing? : Total 6 Click Score: 11    End of Session   Activity Tolerance: Patient limited by fatigue Patient left:  in bed;with bed alarm set;with call bell/phone within reach;Other (comment) (tray over pt on R, fall pad placed on pt's L side) Nurse Communication: Mobility status PT Visit Diagnosis: Muscle weakness (generalized) (M62.81);Unsteadiness on feet (R26.81);Difficulty in walking, not elsewhere classified (R26.2)     Time: 1350-1415 PT Time Calculation (min) (ACUTE ONLY): 25 min  Charges:  $Therapeutic Activity: 8-22 mins                     Marye Round, PT DPT Acute Rehabilitation Services Pager [phone]  Office [phone]    Tyrone Apple E Christain [name] 06/11/2021, 2:48 PM

## 2021-06-11 NOTE — Progress Notes (Signed)
OT Cancellation Note  Patient Details Name: Dylan Weaver MRN: [id number] DOB: [birth year]   Cancelled Treatment:    Reason Eval/Treat Not Completed: Pain limiting ability to participate;Fatigue/lethargy limiting ability to participate;Other (comment) pt reports fatigue and feeling "sluggish" from procedure yesterday. Pt declined OOB mobility and ADLs. Will check back as time allows for OT session.   Lenor Derrick., COTA/L Acute Rehabilitation Services [phone]   Barron Schmid 06/11/2021, 9:56 AM

## 2021-06-12 DIAGNOSIS — K7201 Acute and subacute hepatic failure with coma: Secondary | ICD-10-CM | POA: Diagnosis not present

## 2021-06-12 LAB — BASIC METABOLIC PANEL
Anion gap: 10 (ref 5–15)
BUN: 6 mg/dL (ref 6–20)
CO2: 22 mmol/L (ref 22–32)
Calcium: 8.2 mg/dL — ABNORMAL LOW (ref 8.9–10.3)
Chloride: 100 mmol/L (ref 98–111)
Creatinine, Ser: 0.47 mg/dL — ABNORMAL LOW (ref 0.61–1.24)
GFR, Estimated: >60 mL/min (ref >60)
Glucose, Bld: 112 mg/dL — ABNORMAL HIGH (ref 70–99)
Potassium: 3.8 mmol/L (ref 3.5–5.1)
Sodium: 132 mmol/L — ABNORMAL LOW (ref 135–145)

## 2021-06-12 MED ORDER — MAGNESIUM SULFATE 2 GM/50ML IV SOLN: 2.0000 g | Freq: Once | INTRAVENOUS | Status: DC

## 2021-06-12 MED ORDER — MAGNESIUM SULFATE 4 GM/100ML IV SOLN
4.0000 g | Freq: Once | INTRAVENOUS | Status: AC
  Administered 2021-06-12: 4 g via INTRAVENOUS
  Filled 2021-06-12: qty 100

## 2021-06-12 MED ORDER — MIDODRINE HCL 5 MG PO TABS
2.5000 mg | ORAL_TABLET | Freq: Three times a day (TID) | ORAL | Status: DC
  Administered 2021-06-12 – 2021-07-05 (×67): 2.5 mg via ORAL
  Filled 2021-06-12 (×66): qty 1

## 2021-06-12 MED ORDER — SODIUM CHLORIDE 0.9% FLUSH
10.0000 mL | Freq: Two times a day (BID) | INTRAVENOUS | Status: DC
  Administered 2021-06-12 – 2021-07-04 (×37): 10 mL

## 2021-06-12 MED ORDER — SODIUM CHLORIDE 0.9% FLUSH: 10.0000 mL | INTRAVENOUS | Status: DC | PRN

## 2021-06-12 MED ORDER — MAGNESIUM OXIDE -MG SUPPLEMENT 400 (240 MG) MG PO TABS
400.0000 mg | ORAL_TABLET | Freq: Three times a day (TID) | ORAL | Status: AC
  Administered 2021-06-12 (×3): 400 mg via ORAL
  Filled 2021-06-12 (×3): qty 1

## 2021-06-12 NOTE — Progress Notes (Signed)
[facility] PROGRESS NOTE    Progress Note  Dylan Weaver  STM:[id number] DOB: 12-13-1965 DOA: 06/04/2021 PCP: [name]     Brief Narrative:   Dylan Weaver is an 55 y.o. male severe alcohol abuse who presents to the ED for weakness and jaundice on admission on admission history was limited due to his encephalopathy he was found to be in acute liver failure related to alcohol abuse.  On admission he was started on IV fluids after tried, antibiotics and a PPI,, since then octreotide has been discontinued, his hemoglobin dropped to 5.4 he had to be transfused several units of packed red blood cells, his FOBT was positive GI was consulted and is going for colonoscopy and EGD on 06/09/19 2020 showed esophagitis no signs of bleeding, did show portal hypertensive cath gastropathy versus gastritis and duodenitis, colonoscopy was done that showed multiple polyps and mild diverticulosis.  Patient is awaiting skilled nursing facility placement.  Assessment/Plan:   Acute liver failure with hepatic coma (HCC)/acute metabolic encephalopathy/hepatic encephalopathy Likely due to alcohol abuse, now resolved. LFT's continue to be persistently elevated. His meld score on admission was significantly elevated at 33. Awaiting physical therapy evaluation may need skilled nursing facility.  Possible GI bleed: Admission hemoglobin was low he was transfused 2 units of packed red blood cells. No further signs of overt bleeding hemoglobin has been stable.  Acute kidney injury: With a baseline creatinine of 0.7, on admission 4.4. It improved with IV fluid hydration midodrine.   Alcohol use disorder, severe, dependence (HCC) At risk of severe withdrawals. Continue thiamine and folate continue to monitor with CIWA protocol.  Hypokalemia/severe hypomagnesemia: Basic metabolic panels pending last potassium was 3.8, but he continues to have severe hypomagnesemia which is  resistant, will go ahead and continue oral and IV repletion and recheck magnesium level in the morning.  Chronic thrombocytopenia: This morning is 63 likely due to alcohol abuse.  Weakness/deconditioning: Physical therapy evaluated the patient recommended skilled nursing facility.  DVT prophylaxis: scd Family Communication:none Status is: Inpatient  Remains inpatient appropriate because:Hemodynamically unstable  Dispo: The patient is from: Home              Anticipated d/c is to: SNF              Patient currently is not medically stable to d/c.   Difficult to place patient No     Code Status:     Code Status Orders  (From admission, onward)           Start     Ordered   06/04/21 1953  Full code  Continuous        06/04/21 1955           Code Status History     Date Active Date Inactive Code Status Order ID Comments User Context   02/07/2021 1615 02/08/2021 1608 Full Code [id number]  [facility]   11/29/2020 2312 12/04/2020 1705 Full Code [id number]  Briscoe Deutscher, MD ED   09/14/2020 2205 09/24/2020 1514 Full Code [id number]  Eduard Clos, MD ED         IV Access:   Peripheral IV   Procedures and diagnostic studies:   No results found.   Medical Consultants:   None.   Subjective:    Dylan Weaver no complaints.  Objective:    Vitals:   06/11/21 1536 06/11/21 1947 06/11/21 2325 06/12/21 0407  BP: 97/72 102/68 97/66 104/68  Pulse: 74 77 79 81  Resp: 19 20 20 20   Temp: 97.6 F (36.4 C) 98.2 F (36.8 C) 98 F (36.7 C) 98.7 F (37.1 C)  TempSrc: Oral Oral Oral Oral  SpO2:  99% 96% 97%  Weight:    71.5 kg  Height:       SpO2: 97 % O2 Flow Rate (L/min): 0 L/min   Intake/Output Summary (Last 24 hours) at 06/12/2021 0815 Last data filed at 06/12/2021 0600 Gross per 24 hour  Intake 480 ml  Output 1575 ml  Net -1095 ml    Filed Weights   06/04/21 1600 06/10/21 0413 06/12/21 0407  Weight: 81.6 kg 71.5 kg  71.5 kg    Exam: General exam: In no acute distress. Respiratory system: Good air movement and clear to auscultation. Cardiovascular system: S1 & S2 heard, RRR. No JVD. Gastrointestinal system: Abdomen is nondistended, soft and nontender.  Extremities: No pedal edema. Skin: No rashes, lesions or ulcers  Data Reviewed:    Labs: Basic Metabolic Panel: Recent Labs  Lab 06/07/21 0044 06/08/21 0046 06/09/21 0122 06/10/21 0132 06/11/21 0136 06/11/21 0854  NA 142 142 140 137 132*  --   K 3.0* 3.2* 3.5 3.5 3.8  --   CL 104 108 106 100 101  --   CO2 28 26 26 22  20*  --   GLUCOSE 109* 111* 90 82 104*  --   BUN 47* 21* 10 6 6   --   CREATININE 1.38* 0.93 0.68 0.61 0.76  --   CALCIUM 9.3 9.3 9.3 9.0 8.2*  --   MG 1.8 1.4* 1.1* 1.3* 1.2* 1.2*  PHOS  --   --   --   --   --  2.4*    GFR Estimated Creatinine Clearance: 105.5 mL/min (by C-G formula based on SCr of 0.76 mg/dL). Liver Function Tests: Recent Labs  Lab 06/07/21 0044 06/08/21 0046 06/09/21 0122 06/10/21 0132 06/11/21 0854  AST 125* 151* 155* 175* 142*  ALT 56* 76* 90* 105* 92*  ALKPHOS 104 139* 201* 256* 241*  BILITOT 18.2* 15.9* 14.7* 15.9* 13.6*  PROT 6.3* 6.1* 6.3* 6.8 6.1*  ALBUMIN 2.8* 2.7* 2.5* 2.6* 2.2*    No results for input(s): LIPASE, AMYLASE in the last 168 hours. Recent Labs  Lab 06/09/21 0926  AMMONIA 31    Coagulation profile Recent Labs  Lab 06/07/21 0044 06/08/21 0046 06/09/21 0122 06/10/21 0132 06/10/21 1125  INR 1.2 1.2 1.2 1.1 1.2    COVID-19 Labs  Recent Labs    06/09/21 0926  FERRITIN 902*     Lab Results  Component Value Date   SARSCOV2NAA NEGATIVE 06/04/2021   SARSCOV2NAA NEGATIVE 03/13/2021   SARSCOV2NAA NEGATIVE 02/07/2021   SARSCOV2NAA NEGATIVE 11/29/2020    CBC: Recent Labs  Lab 06/06/21 0811 06/07/21 0044 06/08/21 0046 06/09/21 0122 06/10/21 0132  WBC 7.1 7.8 7.2 7.4 6.4  HGB 8.8* 8.9* 8.9* 10.0* 10.0*  HCT 26.3* 26.4* 26.8* 30.6* 30.5*  MCV  94.6 95.3 96.8 97.1 98.4  PLT 62* 59* 63* 63* 73*    Cardiac Enzymes: No results for input(s): CKTOTAL, CKMB, CKMBINDEX, TROPONINI in the last 168 hours. BNP (last 3 results) No results for input(s): PROBNP in the last 8760 hours. CBG: No results for input(s): GLUCAP in the last 168 hours. D-Dimer: No results for input(s): DDIMER in the last 72 hours. Hgb A1c: No results for input(s): HGBA1C in the last 72 hours. Lipid Profile: No results for input(s): CHOL, HDL, LDLCALC, TRIG,  CHOLHDL, LDLDIRECT in the last 72 hours. Thyroid function studies: No results for input(s): TSH, T4TOTAL, T3FREE, THYROIDAB in the last 72 hours.  Invalid input(s): FREET3 Anemia work up: Recent Labs    06/09/21 0926  VITAMINB12 549  FOLATE 8.0  FERRITIN 902*  TIBC 151*  IRON 110  RETICCTPCT 6.1*    Sepsis Labs: Recent Labs  Lab 06/07/21 0044 06/08/21 0046 06/09/21 0122 06/10/21 0132  WBC 7.8 7.2 7.4 6.4    Microbiology Recent Results (from the past 240 hour(s))  Blood Culture (routine x 2)     Status: None   Collection Time: 06/04/21  3:35 PM   Specimen: BLOOD RIGHT HAND  Result Value Ref Range Status   Specimen Description BLOOD RIGHT HAND  Final   Special Requests   Final    BOTTLES DRAWN AEROBIC AND ANAEROBIC Blood Culture results may not be optimal due to an inadequate volume of blood received in culture bottles   Culture   Final    NO GROWTH 5 DAYS Performed at [facility], [address], [place], [place] [address]    Report Status 06/09/2021 FINAL  Final  Urine Culture     Status: None   Collection Time: 06/04/21  3:35 PM   Specimen: In/Out Cath Urine  Result Value Ref Range Status   Specimen Description IN/OUT CATH URINE  Final   Special Requests NONE  Final   Culture   Final    NO GROWTH Performed at [facility], [address] [place], [place] [address]    Report Status 06/06/2021 FINAL  Final  Resp Panel by RT-PCR (Flu A&B, Covid) Nasopharyngeal Swab      Status: None   Collection Time: 06/04/21  9:35 PM   Specimen: Nasopharyngeal Swab; Nasopharyngeal(NP) swabs in vial transport medium  Result Value Ref Range Status   SARS Coronavirus 2 by RT PCR NEGATIVE NEGATIVE Final    Comment: (NOTE) SARS-CoV-2 target nucleic acids are NOT DETECTED.  The SARS-CoV-2 RNA is generally detectable in upper respiratory specimens during the acute phase of infection. The lowest concentration of SARS-CoV-2 viral copies this assay can detect is 138 copies/mL. A negative result does not preclude SARS-Cov-2 infection and should not be used as the sole basis for treatment or other patient management decisions. A negative result may occur with  improper specimen collection/handling, submission of specimen other than nasopharyngeal swab, presence of viral mutation(s) within the areas targeted by this assay, and inadequate number of viral copies(<138 copies/mL). A negative result must be combined with clinical observations, patient history, and epidemiological information. The expected result is Negative.  Fact Sheet for Patients:  BloggerCourse.com  Fact Sheet for Healthcare Providers:  SeriousBroker.it  This test is no t yet approved or cleared by the [place] FDA and  has been authorized for detection and/or diagnosis of SARS-CoV-2 by FDA under an Emergency Use Authorization (EUA). This EUA will remain  in effect (meaning this test can be used) for the duration of the COVID-19 declaration under Section 564(b)(1) of the Act, 21 U.S.C.section 360bbb-3(b)(1), unless the authorization is terminated  or revoked sooner.       Influenza A by PCR NEGATIVE NEGATIVE Final   Influenza B by PCR NEGATIVE NEGATIVE Final    Comment: (NOTE) The Xpert Xpress SARS-CoV-2/FLU/RSV plus assay is intended as an aid in the diagnosis of influenza from Nasopharyngeal swab specimens and should not be used as a sole  basis for treatment. Nasal washings and aspirates  are unacceptable for Xpert Xpress SARS-CoV-2/FLU/RSV testing.  Fact Sheet for Patients: BloggerCourse.com  Fact Sheet for Healthcare Providers: SeriousBroker.it  This test is not yet approved or cleared by the [place] FDA and has been authorized for detection and/or diagnosis of SARS-CoV-2 by FDA under an Emergency Use Authorization (EUA). This EUA will remain in effect (meaning this test can be used) for the duration of the COVID-19 declaration under Section 564(b)(1) of the Act, 21 U.S.C. section 360bbb-3(b)(1), unless the authorization is terminated or revoked.  Performed at [facility], [address] [place], [place] [address]   Blood Culture (routine x 2)     Status: None   Collection Time: 06/05/21  2:07 AM   Specimen: BLOOD RIGHT HAND  Result Value Ref Range Status   Specimen Description BLOOD RIGHT HAND  Final   Special Requests   Final    BOTTLES DRAWN AEROBIC AND ANAEROBIC Blood Culture adequate volume   Culture   Final    NO GROWTH 5 DAYS Performed at [facility], [address] [place], [place] [address]    Report Status 06/10/2021 FINAL  Final     Medications:    sodium chloride   Intravenous Once   feeding supplement  1 Container Oral TID BM   feeding supplement  237 mL Oral BID BM   folic acid  1 mg Oral Daily   gabapentin  100 mg Oral TID   midodrine  5 mg Oral Q8H   multivitamin with minerals  1 tablet Oral Daily   pantoprazole  40 mg Oral BID   sodium chloride flush  10-40 mL Intracatheter Q12H   sodium chloride flush  3 mL Intravenous Q12H   thiamine  100 mg Oral Daily   Or   thiamine  100 mg Intravenous Daily   Continuous Infusions:      LOS: 8 days   Marinda Elk  [facility]  06/12/2021, 8:15 AM

## 2021-06-13 DIAGNOSIS — F1010 Alcohol abuse, uncomplicated: Secondary | ICD-10-CM | POA: Diagnosis not present

## 2021-06-13 DIAGNOSIS — K7201 Acute and subacute hepatic failure with coma: Secondary | ICD-10-CM | POA: Diagnosis not present

## 2021-06-13 DIAGNOSIS — R7989 Other specified abnormal findings of blood chemistry: Secondary | ICD-10-CM | POA: Diagnosis not present

## 2021-06-13 DIAGNOSIS — F1020 Alcohol dependence, uncomplicated: Secondary | ICD-10-CM | POA: Diagnosis not present

## 2021-06-13 LAB — BASIC METABOLIC PANEL
Anion gap: 7 (ref 5–15)
BUN: 5 mg/dL — ABNORMAL LOW (ref 6–20)
CO2: 22 mmol/L (ref 22–32)
Calcium: 7.8 mg/dL — ABNORMAL LOW (ref 8.9–10.3)
Chloride: 99 mmol/L (ref 98–111)
Creatinine, Ser: 0.56 mg/dL — ABNORMAL LOW (ref 0.61–1.24)
GFR, Estimated: >60 mL/min (ref >60)
Glucose, Bld: 99 mg/dL (ref 70–99)
Potassium: 3.3 mmol/L — ABNORMAL LOW (ref 3.5–5.1)
Sodium: 128 mmol/L — ABNORMAL LOW (ref 135–145)

## 2021-06-13 LAB — SURGICAL PATHOLOGY

## 2021-06-13 LAB — MAGNESIUM: Magnesium: 1.1 mg/dL — ABNORMAL LOW (ref 1.7–2.4)

## 2021-06-13 MED ORDER — MAGNESIUM OXIDE -MG SUPPLEMENT 400 (240 MG) MG PO TABS
800.0000 mg | ORAL_TABLET | Freq: Four times a day (QID) | ORAL | Status: AC
  Administered 2021-06-13 (×4): 800 mg via ORAL
  Filled 2021-06-13 (×4): qty 2

## 2021-06-13 MED ORDER — SODIUM CHLORIDE 0.9 % IV SOLN: INTRAVENOUS | Status: AC

## 2021-06-13 MED ORDER — ENSURE ENLIVE PO LIQD
237.0000 mL | Freq: Three times a day (TID) | ORAL | Status: DC
  Administered 2021-06-13 – 2021-06-19 (×15): 237 mL via ORAL

## 2021-06-13 MED ORDER — FAMOTIDINE 20 MG PO TABS
40.0000 mg | ORAL_TABLET | Freq: Two times a day (BID) | ORAL | Status: DC
  Administered 2021-06-13 – 2021-07-05 (×45): 40 mg via ORAL
  Filled 2021-06-13 (×45): qty 2

## 2021-06-13 MED ORDER — MAGNESIUM SULFATE 4 GM/100ML IV SOLN
4.0000 g | Freq: Once | INTRAVENOUS | Status: AC
  Administered 2021-06-13: 4 g via INTRAVENOUS
  Filled 2021-06-13: qty 100

## 2021-06-13 MED ORDER — POTASSIUM CHLORIDE CRYS ER 20 MEQ PO TBCR
40.0000 meq | EXTENDED_RELEASE_TABLET | Freq: Two times a day (BID) | ORAL | Status: AC
  Administered 2021-06-13 (×2): 40 meq via ORAL
  Filled 2021-06-13 (×2): qty 2

## 2021-06-13 NOTE — Plan of Care (Signed)
  Problem: Education: Goal: Knowledge of General Education information will improve Description: Including pain rating scale, medication(s)/side effects and non-pharmacologic comfort measures Outcome: Progressing   Problem: Activity: Goal: Risk for activity intolerance will decrease Outcome: Progressing   

## 2021-06-13 NOTE — Progress Notes (Signed)
Nutrition Follow-up  DOCUMENTATION CODES:   Severe malnutrition in context of social or environmental circumstances  INTERVENTION:   Ensure Enlive po TID, each supplement provides 350 kcal and 20 grams of protein MVI with minerals daily  NUTRITION DIAGNOSIS:   Severe Malnutrition related to social / environmental circumstances as evidenced by moderate fat depletion, severe muscle depletion, energy intake < 75% for > or equal to 3 months.  Ongoing  GOAL:   Patient will meet greater than or equal to 90% of their needs  Progressing   MONITOR:   PO intake, Supplement acceptance, Weight trends, Labs, I & O's, Skin  REASON FOR ASSESSMENT:   Consult Assessment of nutrition requirement/status, Poor PO  ASSESSMENT:   55 yo male with a PMH of EtOH abuse who presents with acute liver failure with hepatic coma (encephalopathy). Came in with weakness and jaundice.  Intake progressing. Last three meal completions charted as 100%. Taking 3 Ensures daily per report.   Patient endorses loss in appetite for 3-4 months due to ETOH consumption. Reports consuming 1/2 a gallon of Christiane Ha or a 12 pack of beer daily for the last 3 months. Was able to tolerate one meal that consisted of fast food roast beef sandwich with fries. Would sometimes have vomiting episode in am prior to start of ETOH consumption.   Reports a UBW of 185 lb and an unknown amount of weight loss. Records show multiple stated weights in history making it difficult to quantify weight loss.   Admission weight: 81.6 kg  Current weight: 71.5 kg   UOP: 900 ml x 24 hrs   Drips: NS @ 100 ml/hr, Mg sulfate  Medications: folic acid, Mag-Ox, 40 mEq Kcl BID, thiamine  Labs: Na 128 (L) K 3.3 (L) Mg 1.1 (L) Phosphorus 2.4 (L)  NUTRITION - FOCUSED PHYSICAL EXAM:  Flowsheet Row Most Recent Value  Orbital Region Moderate depletion  Upper Arm Region Moderate depletion  Thoracic and Lumbar Region Unable to assess  Buccal  Region Moderate depletion  Temple Region Severe depletion  Clavicle Bone Region Severe depletion  Clavicle and Acromion Bone Region Severe depletion  Scapular Bone Region Unable to assess  Dorsal Hand Severe depletion  Patellar Region Severe depletion  Anterior Thigh Region Severe depletion  Posterior Calf Region Severe depletion  Edema (RD Assessment) None  Hair Reviewed  Eyes Reviewed  Mouth Reviewed  Skin Reviewed  Nails Reviewed      Diet Order:   Diet Order             Diet regular Room service appropriate? Yes; Fluid consistency: Thin  Diet effective now                   EDUCATION NEEDS:   Education needs have been addressed  Skin:  Skin Assessment: Skin Integrity Issues: Skin Integrity Issues:: Other (Comment) Other: Several skin tears, ulcer on back, and wound on groin  Last BM:  9/11  Height:   Ht Readings from Last 1 Encounters:  06/09/21 6' (1.829 m)    Weight:   Wt Readings from Last 1 Encounters:  06/13/21 71.5 kg    BMI:  Body mass index is 21.38 kg/m.  Estimated Nutritional Needs:   Kcal:  2200-2400  Protein:  100-115 grams  Fluid:  >2.2 L   Vanessa Kick MS, RD, LDN, CNSC Clinical Nutrition Pager listed in AMION

## 2021-06-13 NOTE — Telephone Encounter (Signed)
The pt has been scheduled for EGD on 08/09/21 at 10 am in the [facility]. I have mailed the instructions to the home.

## 2021-06-13 NOTE — Progress Notes (Signed)
[facility] will [facility] PROGRESS NOTE    Progress Note  Dylan Weaver  HWE:[id number] DOB: 1965-12-31 DOA: 06/04/2021 PCP: [name]     Brief Narrative:   Dylan Weaver is an 55 y.o. male severe alcohol abuse who presents to the ED for weakness and jaundice on admission on admission history was limited due to his encephalopathy he was found to be in acute liver failure related to alcohol abuse.  On admission he was started on IV fluids after tried, antibiotics and a PPI,, since then octreotide has been discontinued, his hemoglobin dropped to 5.4 he had to be transfused several units of packed red blood cells, his FOBT was positive GI was consulted and is going for colonoscopy and EGD on 06/09/19 2020 showed esophagitis no signs of bleeding, did show portal hypertensive cath gastropathy versus gastritis and duodenitis, colonoscopy was done that showed multiple polyps and mild diverticulosis.   Assessment/Plan:   Acute liver failure with hepatic coma (HCC)/acute metabolic encephalopathy/hepatic encephalopathy Likely due to alcohol abuse, now resolved. LFT's continue to be persistently elevated. His meld score on admission was significantly elevated at 33. Awaiting physical therapy evaluation may need skilled nursing facility.  Possible GI bleed: Admission hemoglobin was low he was transfused 2 units of packed red blood cells. No further signs of overt bleeding hemoglobin has been stable.  Acute kidney injury: With a baseline creatinine of 0.7, on admission 4.4. It improved with IV fluid hydration midodrine.   Alcohol use disorder, severe, dependence (HCC) At risk of severe withdrawals. Continue thiamine and folate continue to monitor with CIWA protocol.  Hypokalemia/Severe hypomagnesemia: Magnesium continues to be significantly low I discussed with nephrology, he is on a proton pump inhibitor which will inhibit absorption of magnesium. He  does not have a duodenal ulcer just esophagitis we will change him to famotidine max dose twice a day replete magnesium and potassium orally recheck tomorrow morning.  Chronic thrombocytopenia: This morning is 63 likely due to alcohol abuse.  Weakness/deconditioning: Physical therapy evaluated the patient recommended skilled nursing facility.  DVT prophylaxis: scd Family Communication:none Status is: Inpatient  Remains inpatient appropriate because:Hemodynamically unstable  Dispo: The patient is from: Home              Anticipated d/c is to: SNF              Patient currently is not medically stable to d/c.   Difficult to place patient No     Code Status:     Code Status Orders  (From admission, onward)           Start     Ordered   06/04/21 1953  Full code  Continuous        06/04/21 1955           Code Status History     Date Active Date Inactive Code Status Order ID Comments User Context   02/07/2021 1615 02/08/2021 1608 Full Code [id number]  [facility]   11/29/2020 2312 12/04/2020 1705 Full Code [id number]  Briscoe Deutscher, MD ED   09/14/2020 2205 09/24/2020 1514 Full Code [id number]  Eduard Clos, MD ED         IV Access:   Peripheral IV   Procedures and diagnostic studies:   No results found.   Medical Consultants:   None.   Subjective:    [name] no complains  Objective:    Vitals:   06/12/21 1946 06/12/21  2324 06/13/21 0330 06/13/21 0828  BP: 101/65 101/66 (!) 91/57 96/65  Pulse: 79 85 92 84  Resp: 19 17 17 20   Temp: 98.1 F (36.7 C) 98.4 F (36.9 C) 98.9 F (37.2 C) 98 F (36.7 C)  TempSrc: Oral Oral Oral Oral  SpO2: 99% 100% 98% 97%  Weight:   71.5 kg   Height:       SpO2: 97 % O2 Flow Rate (L/min): 0 L/min   Intake/Output Summary (Last 24 hours) at 06/13/2021 0908 Last data filed at 06/12/2021 1524 Gross per 24 hour  Intake 480 ml  Output 600 ml  Net -120 ml    Filed Weights    06/10/21 0413 06/12/21 0407 06/13/21 0330  Weight: 71.5 kg 71.5 kg 71.5 kg    Exam: General exam: In no acute distress. Respiratory system: Good air movement and clear to auscultation. Cardiovascular system: S1 & S2 heard, RRR. No JVD. Gastrointestinal system: Abdomen is nondistended, soft and nontender.  Extremities: No pedal edema. Skin: No rashes, lesions or ulcers  Data Reviewed:    Labs: Basic Metabolic Panel: Recent Labs  Lab 06/09/21 0122 06/10/21 0132 06/11/21 0136 06/11/21 0854 06/12/21 0939 06/13/21 0530  NA 140 137 132*  --  132* 128*  K 3.5 3.5 3.8  --  3.8 3.3*  CL 106 100 101  --  100 99  CO2 26 22 20*  --  22 22  GLUCOSE 90 82 104*  --  112* 99  BUN 10 6 6   --  6 5*  CREATININE 0.68 0.61 0.76  --  0.47* 0.56*  CALCIUM 9.3 9.0 8.2*  --  8.2* 7.8*  MG 1.1* 1.3* 1.2* 1.2*  --  1.1*  PHOS  --   --   --  2.4*  --   --     GFR Estimated Creatinine Clearance: 105.5 mL/min (A) (by C-G formula based on SCr of 0.56 mg/dL (L)). Liver Function Tests: Recent Labs  Lab 06/07/21 0044 06/08/21 0046 06/09/21 0122 06/10/21 0132 06/11/21 0854  AST 125* 151* 155* 175* 142*  ALT 56* 76* 90* 105* 92*  ALKPHOS 104 139* 201* 256* 241*  BILITOT 18.2* 15.9* 14.7* 15.9* 13.6*  PROT 6.3* 6.1* 6.3* 6.8 6.1*  ALBUMIN 2.8* 2.7* 2.5* 2.6* 2.2*    No results for input(s): LIPASE, AMYLASE in the last 168 hours. Recent Labs  Lab 06/09/21 0926  AMMONIA 31    Coagulation profile Recent Labs  Lab 06/07/21 0044 06/08/21 0046 06/09/21 0122 06/10/21 0132 06/10/21 1125  INR 1.2 1.2 1.2 1.1 1.2    COVID-19 Labs  No results for input(s): DDIMER, FERRITIN, LDH, CRP in the last 72 hours.   Lab Results  Component Value Date   SARSCOV2NAA NEGATIVE 06/04/2021   SARSCOV2NAA NEGATIVE 03/13/2021   SARSCOV2NAA NEGATIVE 02/07/2021   SARSCOV2NAA NEGATIVE 11/29/2020    CBC: Recent Labs  Lab 06/07/21 0044 06/08/21 0046 06/09/21 0122 06/10/21 0132  WBC 7.8 7.2 7.4  6.4  HGB 8.9* 8.9* 10.0* 10.0*  HCT 26.4* 26.8* 30.6* 30.5*  MCV 95.3 96.8 97.1 98.4  PLT 59* 63* 63* 73*    Cardiac Enzymes: No results for input(s): CKTOTAL, CKMB, CKMBINDEX, TROPONINI in the last 168 hours. BNP (last 3 results) No results for input(s): PROBNP in the last 8760 hours. CBG: No results for input(s): GLUCAP in the last 168 hours. D-Dimer: No results for input(s): DDIMER in the last 72 hours. Hgb A1c: No results for input(s): HGBA1C in the last 72  hours. Lipid Profile: No results for input(s): CHOL, HDL, LDLCALC, TRIG, CHOLHDL, LDLDIRECT in the last 72 hours. Thyroid function studies: No results for input(s): TSH, T4TOTAL, T3FREE, THYROIDAB in the last 72 hours.  Invalid input(s): FREET3 Anemia work up: No results for input(s): VITAMINB12, FOLATE, FERRITIN, TIBC, IRON, RETICCTPCT in the last 72 hours.  Sepsis Labs: Recent Labs  Lab 06/07/21 0044 06/08/21 0046 06/09/21 0122 06/10/21 0132  WBC 7.8 7.2 7.4 6.4    Microbiology Recent Results (from the past 240 hour(s))  Blood Culture (routine x 2)     Status: None   Collection Time: 06/04/21  3:35 PM   Specimen: BLOOD RIGHT HAND  Result Value Ref Range Status   Specimen Description BLOOD RIGHT HAND  Final   Special Requests   Final    BOTTLES DRAWN AEROBIC AND ANAEROBIC Blood Culture results may not be optimal due to an inadequate volume of blood received in culture bottles   Culture   Final    NO GROWTH 5 DAYS Performed at [facility], [address] [place], [place] [address]    Report Status 06/09/2021 FINAL  Final  Urine Culture     Status: None   Collection Time: 06/04/21  3:35 PM   Specimen: In/Out Cath Urine  Result Value Ref Range Status   Specimen Description IN/OUT CATH URINE  Final   Special Requests NONE  Final   Culture   Final    NO GROWTH Performed at [facility], [address], [place], [place] [address]    Report Status 06/06/2021 FINAL  Final  Resp Panel by  RT-PCR (Flu A&B, Covid) Nasopharyngeal Swab     Status: None   Collection Time: 06/04/21  9:35 PM   Specimen: Nasopharyngeal Swab; Nasopharyngeal(NP) swabs in vial transport medium  Result Value Ref Range Status   SARS Coronavirus 2 by RT PCR NEGATIVE NEGATIVE Final    Comment: (NOTE) SARS-CoV-2 target nucleic acids are NOT DETECTED.  The SARS-CoV-2 RNA is generally detectable in upper respiratory specimens during the acute phase of infection. The lowest concentration of SARS-CoV-2 viral copies this assay can detect is 138 copies/mL. A negative result does not preclude SARS-Cov-2 infection and should not be used as the sole basis for treatment or other patient management decisions. A negative result may occur with  improper specimen collection/handling, submission of specimen other than nasopharyngeal swab, presence of viral mutation(s) within the areas targeted by this assay, and inadequate number of viral copies(<138 copies/mL). A negative result must be combined with clinical observations, patient history, and epidemiological information. The expected result is Negative.  Fact Sheet for Patients:  BloggerCourse.com  Fact Sheet for Healthcare Providers:  SeriousBroker.it  This test is no t yet approved or cleared by the [place] FDA and  has been authorized for detection and/or diagnosis of SARS-CoV-2 by FDA under an Emergency Use Authorization (EUA). This EUA will remain  in effect (meaning this test can be used) for the duration of the COVID-19 declaration under Section 564(b)(1) of the Act, 21 U.S.C.section 360bbb-3(b)(1), unless the authorization is terminated  or revoked sooner.       Influenza A by PCR NEGATIVE NEGATIVE Final   Influenza B by PCR NEGATIVE NEGATIVE Final    Comment: (NOTE) The Xpert Xpress SARS-CoV-2/FLU/RSV plus assay is intended as an aid in the diagnosis of influenza from Nasopharyngeal swab  specimens and should not be used as a sole basis for treatment. Nasal washings and aspirates are  unacceptable for Xpert Xpress SARS-CoV-2/FLU/RSV testing.  Fact Sheet for Patients: BloggerCourse.com  Fact Sheet for Healthcare Providers: SeriousBroker.it  This test is not yet approved or cleared by the [place] FDA and has been authorized for detection and/or diagnosis of SARS-CoV-2 by FDA under an Emergency Use Authorization (EUA). This EUA will remain in effect (meaning this test can be used) for the duration of the COVID-19 declaration under Section 564(b)(1) of the Act, 21 U.S.C. section 360bbb-3(b)(1), unless the authorization is terminated or revoked.  Performed at [facility], [address] [place], [place] [address]   Blood Culture (routine x 2)     Status: None   Collection Time: 06/05/21  2:07 AM   Specimen: BLOOD RIGHT HAND  Result Value Ref Range Status   Specimen Description BLOOD RIGHT HAND  Final   Special Requests   Final    BOTTLES DRAWN AEROBIC AND ANAEROBIC Blood Culture adequate volume   Culture   Final    NO GROWTH 5 DAYS Performed at [facility], [address], [place], [place] [address]    Report Status 06/10/2021 FINAL  Final     Medications:    sodium chloride   Intravenous Once   feeding supplement  1 Container Oral TID BM   feeding supplement  237 mL Oral BID BM   folic acid  1 mg Oral Daily   gabapentin  100 mg Oral TID   magnesium oxide  800 mg Oral QID   midodrine  2.5 mg Oral TID WC   multivitamin with minerals  1 tablet Oral Daily   pantoprazole  40 mg Oral BID   potassium chloride  40 mEq Oral BID   sodium chloride flush  10-40 mL Intracatheter Q12H   sodium chloride flush  3 mL Intravenous Q12H   thiamine  100 mg Oral Daily   Or   thiamine  100 mg Intravenous Daily   Continuous Infusions:  sodium chloride     magnesium sulfate bolus IVPB         LOS: 9  days   Marinda Elk  [facility]  06/13/2021, 9:08 AM

## 2021-06-14 DIAGNOSIS — K7201 Acute and subacute hepatic failure with coma: Secondary | ICD-10-CM | POA: Diagnosis not present

## 2021-06-14 DIAGNOSIS — E440 Moderate protein-calorie malnutrition: Secondary | ICD-10-CM | POA: Insufficient documentation

## 2021-06-14 DIAGNOSIS — F1010 Alcohol abuse, uncomplicated: Secondary | ICD-10-CM | POA: Diagnosis not present

## 2021-06-14 DIAGNOSIS — K7290 Hepatic failure, unspecified without coma: Secondary | ICD-10-CM | POA: Diagnosis not present

## 2021-06-14 DIAGNOSIS — E43 Unspecified severe protein-calorie malnutrition: Secondary | ICD-10-CM | POA: Insufficient documentation

## 2021-06-14 DIAGNOSIS — R7989 Other specified abnormal findings of blood chemistry: Secondary | ICD-10-CM | POA: Diagnosis not present

## 2021-06-14 LAB — BASIC METABOLIC PANEL
Anion gap: 9 (ref 5–15)
BUN: 5 mg/dL — ABNORMAL LOW (ref 6–20)
CO2: 22 mmol/L (ref 22–32)
Calcium: 8.5 mg/dL — ABNORMAL LOW (ref 8.9–10.3)
Chloride: 100 mmol/L (ref 98–111)
Creatinine, Ser: 0.53 mg/dL — ABNORMAL LOW (ref 0.61–1.24)
GFR, Estimated: >60 mL/min (ref >60)
Glucose, Bld: 96 mg/dL (ref 70–99)
Potassium: 3.8 mmol/L (ref 3.5–5.1)
Sodium: 131 mmol/L — ABNORMAL LOW (ref 135–145)

## 2021-06-14 LAB — MAGNESIUM: Magnesium: 1.5 mg/dL — ABNORMAL LOW (ref 1.7–2.4)

## 2021-06-14 MED ORDER — MAGNESIUM OXIDE -MG SUPPLEMENT 400 (240 MG) MG PO TABS
800.0000 mg | ORAL_TABLET | Freq: Four times a day (QID) | ORAL | Status: AC
  Administered 2021-06-14 – 2021-06-15 (×8): 800 mg via ORAL
  Filled 2021-06-14 (×8): qty 2

## 2021-06-14 MED ORDER — MAGNESIUM SULFATE 4 GM/100ML IV SOLN
4.0000 g | Freq: Once | INTRAVENOUS | Status: AC
  Administered 2021-06-14: 4 g via INTRAVENOUS
  Filled 2021-06-14: qty 100

## 2021-06-14 MED ORDER — GABAPENTIN 100 MG PO CAPS: 100.0000 mg | ORAL_CAPSULE | Freq: Three times a day (TID) | ORAL | 0 refills | Status: DC

## 2021-06-14 MED ORDER — MAGNESIUM OXIDE -MG SUPPLEMENT 400 (240 MG) MG PO TABS: 800.0000 mg | ORAL_TABLET | Freq: Four times a day (QID) | ORAL | Status: DC

## 2021-06-14 MED ORDER — MAGNESIUM OXIDE -MG SUPPLEMENT 400 (240 MG) MG PO TABS: 400.0000 mg | ORAL_TABLET | Freq: Two times a day (BID) | ORAL | 0 refills | Status: DC

## 2021-06-14 MED ORDER — FAMOTIDINE 40 MG PO TABS: 40.0000 mg | ORAL_TABLET | Freq: Two times a day (BID) | ORAL | 2 refills | Status: DC

## 2021-06-14 MED ORDER — MIDODRINE HCL 2.5 MG PO TABS: 2.5000 mg | ORAL_TABLET | Freq: Three times a day (TID) | ORAL | 0 refills | Status: DC

## 2021-06-14 NOTE — Discharge Summary (Signed)
Physician Discharge Summary  Dylan Weaver YBO:[id number] DOB: 1966-03-13 DOA: 06/04/2021  PCP: [name]  Admit date: 06/04/2021 Discharge date: 06/14/2021  Admitted From: Home Disposition:  SNF  Recommendations for Outpatient Follow-up:  Follow up with PCP in 1-2 weeks Please obtain BMP/CBC in one week   Home Health:no Equipment/Devices:None  Discharge Condition: Guarded CODE STATUS:Full Diet recommendation: Heart Healthy  Brief/Interim Summary: 55 y.o. male severe alcohol abuse who presents to the ED for weakness and jaundice on admission on admission history was limited due to his encephalopathy he was found to be in acute liver failure related to alcohol abuse.  On admission he was started on IV fluids after tried, antibiotics and a PPI,, since then octreotide has been discontinued, his hemoglobin dropped to 5.4 he had to be transfused several units of packed red blood cells, his FOBT was positive GI was consulted and is going for colonoscopy and EGD on 06/09/19 2020 showed esophagitis no signs of bleeding, did show portal hypertensive cath gastropathy versus gastritis and duodenitis, colonoscopy was done that showed multiple polyps and mild diverticulosis.  Discharge Diagnoses:  Principal Problem:   Acute liver failure with hepatic coma (HCC) Active Problems:   Alcohol use disorder, severe, dependence (HCC)   Thrombocytopenia (HCC)   Elevated LFTs   Acute renal failure (ARF) (HCC)   Protein-calorie malnutrition, severe  Acute liver failure with hepatic coma/acute metabolic encephalopathy/hepatic encephalopathy: Likely due to alcohol abuse, GI was consulted to perform endoscopy on 06/08/2021 that showed esophagitis no signs of bleeding did show portal hypertensive gastropathy and gastritis. His meld score was 33. MDF on admission was 7.6 he was not a candidate for steroids. He was treated conservatively and has improved.  Possible GI bleed: On admission  his hemoglobin was low he was transfused 2 unit of packed red blood cells started on IV Protonix and octreotide EGD was done that showed no esophageal varices but is showed some esophagitis but no signs of bleeding. Colonoscopy was done that showed on 06/10/2021 that showed multiple polyps mild diverticulosis nonbleeding internal hemorrhoids. He was continued on Protonix which it was transition to oral famotidine which she will continue as an outpatient for the details below.  Acute kidney injury: With a baseline creatinine of 0.7 on admission 4.4 resolved with IV fluid hydration.  Alcohol abuse/dependence: You started on thiamine and folate and monitor with CIWA protocol he was treated conservatively. With minimal benzodiazepines.  Hypokalemia severe hypomagnesemia: He was on Protonix for his GI bleed he was treated with IV and oral magnesium but his potassium continue to be low. It was discussed with renal and they recommended to stop Protonix and start him on a H2 blocker famotidine. After this change his magnesium started to trend down. He will continue to take magnesium as an outpatient for 2 more days and recheck a basic metabolic panel along with a magnesium level in 2 weeks.  Chronic thrombocytopenia: There is likely due to alcohol abuse hemoglobin remained stable.  Weakness/deconditioning: Physical therapy evaluated the patient and recommended skilled nursing facility.    Discharge Instructions  Discharge Instructions     Diet - low sodium heart healthy   Complete by: As directed    Discharge wound care:   Complete by: As directed    Per wound care instructions   Increase activity slowly   Complete by: As directed       Allergies as of 06/14/2021   No Known Allergies      Medication List  STOP taking these medications    doxycycline 100 MG capsule Commonly known as: VIBRAMYCIN   ketoconazole 2 % cream Commonly known as: NIZORAL   liver oil-zinc oxide 40  % ointment Commonly known as: DESITIN       TAKE these medications    famotidine 40 MG tablet Commonly known as: PEPCID Take 1 tablet (40 mg total) by mouth 2 (two) times daily.   folic acid 1 MG tablet Commonly known as: FOLVITE Take 1 tablet (1 mg total) by mouth daily.   gabapentin 100 MG capsule Commonly known as: NEURONTIN Take 1 capsule (100 mg total) by mouth 3 (three) times daily.   Gerhardt's butt cream Crea Apply 1 application topically 2 (two) times daily.   magnesium oxide 400 (240 Mg) MG tablet Commonly known as: MAG-OX Take 1 tablet (400 mg total) by mouth 2 (two) times daily for 5 days.   midodrine 2.5 MG tablet Commonly known as: PROAMATINE Take 1 tablet (2.5 mg total) by mouth 3 (three) times daily with meals.   multivitamin with minerals Tabs tablet Take 1 tablet by mouth daily before breakfast.               Discharge Care Instructions  (From admission, onward)           Start     Ordered   06/14/21 0000  Discharge wound care:       Comments: Per wound care instructions   06/14/21 0905            No Known Allergies  Consultations: Gastroenterology   Procedures/Studies: US RENAL  Result Date: 06/04/2021 CLINICAL DATA:  Acute renal failure EXAM: RENAL / URINARY TRACT ULTRASOUND COMPLETE COMPARISON:  None. FINDINGS: Right Kidney: Renal measurements: 11.6 x 5.8 x 6.5 cm = volume: 226 mL. Echogenicity within normal limits. No mass or hydronephrosis visualized. Left Kidney: Renal measurements: 10.6 x 5.8 x 5.1 cm = volume: 163 mL. Echogenicity within normal limits. No mass or hydronephrosis visualized. Bladder: Appears normal for degree of bladder distention. Other: None. IMPRESSION: Unremarkable renal ultrasound. Electronically Signed   By: Charlett Nose M.D.   On: 06/04/2021 23:08   DG Chest Port 1 View  Result Date: 06/04/2021 CLINICAL DATA:  Questionable sepsis, EXAM: PORTABLE CHEST 1 VIEW COMPARISON:  March 13, 2021 FINDINGS: The  heart size and mediastinal contours are within normal limits. Linear opacity in the left lung base, favor atelectasis. No pleural effusion. No pneumothorax. No acute osseous abnormality. Remote bilateral clavicle fractures. Remote left-sided rib fractures. IMPRESSION: Linear opacity in the left lung base, favor atelectasis. Electronically Signed   By: Maudry Mayhew M.D.   On: 06/04/2021 15:56   US Abdomen Limited RUQ (LIVER/GB)  Result Date: 06/04/2021 CLINICAL DATA:  Jaundice EXAM: ULTRASOUND ABDOMEN LIMITED RIGHT UPPER QUADRANT COMPARISON:  Abdominal ultrasound 11/29/2020 FINDINGS: Gallbladder: There are small gallstones. No gallbladder wall thickening. No sonographic Murphy sign noted by sonographer. Common bile duct: Diameter: 0.4 cm, within normal limits Liver: No focal lesion identified. Parenchymal echogenicity is diffusely increased. Portal vein is patent on color Doppler imaging with normal direction of blood flow towards the liver. Other: None. IMPRESSION: 1.  No acute finding sonographically in the abdomen. 2.  Cholelithiasis without evidence of acute cholecystitis. 3. Diffusely increased liver parenchymal echogenicity which is nonspecific but most commonly seen with hepatic steatosis. Electronically Signed   By: Emmaline Kluver M.D.   On: 06/04/2021 18:15   (Echo, Carotid, EGD, Colonoscopy, ERCP)    Subjective: No  complaints  Discharge Exam: Vitals:   06/13/21 2339 06/14/21 0400  BP: 101/68 (!) 96/59  Pulse: 70 70  Resp: 20 16  Temp: 98.6 F (37 C) 98.7 F (37.1 C)  SpO2: 98% 100%   Vitals:   06/13/21 2045 06/13/21 2111 06/13/21 2339 06/14/21 0400  BP:  98/71 101/68 (!) 96/59  Pulse:  75 70 70  Resp: 19 20 20 16   Temp:  98 F (36.7 C) 98.6 F (37 C) 98.7 F (37.1 C)  TempSrc:  Oral Oral Oral  SpO2: 98% 98% 98% 100%  Weight:      Height:        General: Pt is alert, awake, not in acute distress Cardiovascular: RRR, S1/S2 +, no rubs, no gallops Respiratory: CTA  bilaterally, no wheezing, no rhonchi Abdominal: Soft, NT, ND, bowel sounds + Extremities: no edema, no cyanosis    The results of significant diagnostics from this hospitalization (including imaging, microbiology, ancillary and laboratory) are listed below for reference.     Microbiology: Recent Results (from the past 240 hour(s))  Blood Culture (routine x 2)     Status: None   Collection Time: 06/04/21  3:35 PM   Specimen: BLOOD RIGHT HAND  Result Value Ref Range Status   Specimen Description BLOOD RIGHT HAND  Final   Special Requests   Final    BOTTLES DRAWN AEROBIC AND ANAEROBIC Blood Culture results may not be optimal due to an inadequate volume of blood received in culture bottles   Culture   Final    NO GROWTH 5 DAYS Performed at [facility], [address] [place], [place]    Report Status 06/09/2021 FINAL  Final  Urine Culture     Status: None   Collection Time: 06/04/21  3:35 PM   Specimen: In/Out Cath Urine  Result Value Ref Range Status   Specimen Description IN/OUT CATH URINE  Final   Special Requests NONE  Final   Culture   Final    NO GROWTH Performed at [facility], [address], [place], [place]    Report Status 06/06/2021 FINAL  Final  Resp Panel by RT-PCR (Flu A&B, Covid) Nasopharyngeal Swab     Status: None   Collection Time: 06/04/21  9:35 PM   Specimen: Nasopharyngeal Swab; Nasopharyngeal(NP) swabs in vial transport medium  Result Value Ref Range Status   SARS Coronavirus 2 by RT PCR NEGATIVE NEGATIVE Final    Comment: (NOTE) SARS-CoV-2 target nucleic acids are NOT DETECTED.  The SARS-CoV-2 RNA is generally detectable in upper respiratory specimens during the acute phase of infection. The lowest concentration of SARS-CoV-2 viral copies this assay can detect is 138 copies/mL. A negative result does not preclude SARS-Cov-2 infection and should not be used as the sole basis for treatment or other patient management  decisions. A negative result may occur with  improper specimen collection/handling, submission of specimen other than nasopharyngeal swab, presence of viral mutation(s) within the areas targeted by this assay, and inadequate number of viral copies(<138 copies/mL). A negative result must be combined with clinical observations, patient history, and epidemiological information. The expected result is Negative.  Fact Sheet for Patients:  08/04/21  Fact Sheet for Healthcare Providers:  BloggerCourse.com  This test is no t yet approved or cleared by the [place] FDA and  has been authorized for detection and/or diagnosis of SARS-CoV-2 by FDA under an Emergency Use Authorization (EUA). This EUA will remain  in effect (  meaning this test can be used) for the duration of the COVID-19 declaration under Section 564(b)(1) of the Act, 21 U.S.C.section 360bbb-3(b)(1), unless the authorization is terminated  or revoked sooner.       Influenza A by PCR NEGATIVE NEGATIVE Final   Influenza B by PCR NEGATIVE NEGATIVE Final    Comment: (NOTE) The Xpert Xpress SARS-CoV-2/FLU/RSV plus assay is intended as an aid in the diagnosis of influenza from Nasopharyngeal swab specimens and should not be used as a sole basis for treatment. Nasal washings and aspirates are unacceptable for Xpert Xpress SARS-CoV-2/FLU/RSV testing.  Fact Sheet for Patients: BloggerCourse.com  Fact Sheet for Healthcare Providers: SeriousBroker.it  This test is not yet approved or cleared by the [place] FDA and has been authorized for detection and/or diagnosis of SARS-CoV-2 by FDA under an Emergency Use Authorization (EUA). This EUA will remain in effect (meaning this test can be used) for the duration of the COVID-19 declaration under Section 564(b)(1) of the Act, 21 U.S.C. section 360bbb-3(b)(1), unless the  authorization is terminated or revoked.  Performed at [facility], [address] [place], [place] [address]   Blood Culture (routine x 2)     Status: None   Collection Time: 06/05/21  2:07 AM   Specimen: BLOOD RIGHT HAND  Result Value Ref Range Status   Specimen Description BLOOD RIGHT HAND  Final   Special Requests   Final    BOTTLES DRAWN AEROBIC AND ANAEROBIC Blood Culture adequate volume   Culture   Final    NO GROWTH 5 DAYS Performed at [facility], [address] [place], [place] [address]    Report Status 06/10/2021 FINAL  Final     Labs: BNP (last 3 results) No results for input(s): BNP in the last 8760 hours. Basic Metabolic Panel: Recent Labs  Lab 06/10/21 0132 06/11/21 0136 06/11/21 0854 06/12/21 0939 06/13/21 0530 06/14/21 0631  NA 137 132*  --  132* 128* 131*  K 3.5 3.8  --  3.8 3.3* 3.8  CL 100 101  --  100 99 100  CO2 22 20*  --  22 22 22   GLUCOSE 82 104*  --  112* 99 96  BUN 6 6  --  6 5* 5*  CREATININE 0.61 0.76  --  0.47* 0.56* 0.53*  CALCIUM 9.0 8.2*  --  8.2* 7.8* 8.5*  MG 1.3* 1.2* 1.2*  --  1.1* 1.5*  PHOS  --   --  2.4*  --   --   --    Liver Function Tests: Recent Labs  Lab 06/08/21 0046 06/09/21 0122 06/10/21 0132 06/11/21 0854  AST 151* 155* 175* 142*  ALT 76* 90* 105* 92*  ALKPHOS 139* 201* 256* 241*  BILITOT 15.9* 14.7* 15.9* 13.6*  PROT 6.1* 6.3* 6.8 6.1*  ALBUMIN 2.7* 2.5* 2.6* 2.2*   No results for input(s): LIPASE, AMYLASE in the last 168 hours. Recent Labs  Lab 06/09/21 0926  AMMONIA 31   CBC: Recent Labs  Lab 06/08/21 0046 06/09/21 0122 06/10/21 0132  WBC 7.2 7.4 6.4  HGB 8.9* 10.0* 10.0*  HCT 26.8* 30.6* 30.5*  MCV 96.8 97.1 98.4  PLT 63* 63* 73*   Cardiac Enzymes: No results for input(s): CKTOTAL, CKMB, CKMBINDEX, TROPONINI in the last 168 hours. BNP: Invalid input(s): POCBNP CBG: No results for input(s): GLUCAP in the last 168 hours. D-Dimer No results for input(s): DDIMER in the  last 72 hours. Hgb A1c No results for  input(s): HGBA1C in the last 72 hours. Lipid Profile No results for input(s): CHOL, HDL, LDLCALC, TRIG, CHOLHDL, LDLDIRECT in the last 72 hours. Thyroid function studies No results for input(s): TSH, T4TOTAL, T3FREE, THYROIDAB in the last 72 hours.  Invalid input(s): FREET3 Anemia work up No results for input(s): VITAMINB12, FOLATE, FERRITIN, TIBC, IRON, RETICCTPCT in the last 72 hours. Urinalysis    Component Value Date/Time   COLORURINE YELLOW 01/24/2021 1601   APPEARANCEUR CLEAR 01/24/2021 1601   LABSPEC 1.009 01/24/2021 1601   PHURINE 6.0 01/24/2021 1601   GLUCOSEU NEGATIVE 01/24/2021 1601   HGBUR NEGATIVE 01/24/2021 1601   BILIRUBINUR NEGATIVE 01/24/2021 1601   KETONESUR NEGATIVE 01/24/2021 1601   PROTEINUR NEGATIVE 01/24/2021 1601   NITRITE NEGATIVE 01/24/2021 1601   LEUKOCYTESUR NEGATIVE 01/24/2021 1601   Sepsis Labs Invalid input(s): PROCALCITONIN,  WBC,  LACTICIDVEN Microbiology Recent Results (from the past 240 hour(s))  Blood Culture (routine x 2)     Status: None   Collection Time: 06/04/21  3:35 PM   Specimen: BLOOD RIGHT HAND  Result Value Ref Range Status   Specimen Description BLOOD RIGHT HAND  Final   Special Requests   Final    BOTTLES DRAWN AEROBIC AND ANAEROBIC Blood Culture results may not be optimal due to an inadequate volume of blood received in culture bottles   Culture   Final    NO GROWTH 5 DAYS Performed at [facility], [address] [place], [place] [address]    Report Status 06/09/2021 FINAL  Final  Urine Culture     Status: None   Collection Time: 06/04/21  3:35 PM   Specimen: In/Out Cath Urine  Result Value Ref Range Status   Specimen Description IN/OUT CATH URINE  Final   Special Requests NONE  Final   Culture   Final    NO GROWTH Performed at [facility], [address], [place], [place] [address]    Report Status 06/06/2021 FINAL  Final  Resp Panel by RT-PCR (Flu A&B, Covid)  Nasopharyngeal Swab     Status: None   Collection Time: 06/04/21  9:35 PM   Specimen: Nasopharyngeal Swab; Nasopharyngeal(NP) swabs in vial transport medium  Result Value Ref Range Status   SARS Coronavirus 2 by RT PCR NEGATIVE NEGATIVE Final    Comment: (NOTE) SARS-CoV-2 target nucleic acids are NOT DETECTED.  The SARS-CoV-2 RNA is generally detectable in upper respiratory specimens during the acute phase of infection. The lowest concentration of SARS-CoV-2 viral copies this assay can detect is 138 copies/mL. A negative result does not preclude SARS-Cov-2 infection and should not be used as the sole basis for treatment or other patient management decisions. A negative result may occur with  improper specimen collection/handling, submission of specimen other than nasopharyngeal swab, presence of viral mutation(s) within the areas targeted by this assay, and inadequate number of viral copies(<138 copies/mL). A negative result must be combined with clinical observations, patient history, and epidemiological information. The expected result is Negative.  Fact Sheet for Patients:  BloggerCourse.com  Fact Sheet for Healthcare Providers:  SeriousBroker.it  This test is no t yet approved or cleared by the [place] FDA and  has been authorized for detection and/or diagnosis of SARS-CoV-2 by FDA under an Emergency Use Authorization (EUA). This EUA will remain  in effect (meaning this test can be used) for the duration of the COVID-19 declaration under Section 564(b)(1) of the Act, 21 U.S.C.section 360bbb-3(b)(1), unless the authorization is terminated  or  revoked sooner.       Influenza A by PCR NEGATIVE NEGATIVE Final   Influenza B by PCR NEGATIVE NEGATIVE Final    Comment: (NOTE) The Xpert Xpress SARS-CoV-2/FLU/RSV plus assay is intended as an aid in the diagnosis of influenza from Nasopharyngeal swab specimens and should not be  used as a sole basis for treatment. Nasal washings and aspirates are unacceptable for Xpert Xpress SARS-CoV-2/FLU/RSV testing.  Fact Sheet for Patients: BloggerCourse.com  Fact Sheet for Healthcare Providers: SeriousBroker.it  This test is not yet approved or cleared by the [place] FDA and has been authorized for detection and/or diagnosis of SARS-CoV-2 by FDA under an Emergency Use Authorization (EUA). This EUA will remain in effect (meaning this test can be used) for the duration of the COVID-19 declaration under Section 564(b)(1) of the Act, 21 U.S.C. section 360bbb-3(b)(1), unless the authorization is terminated or revoked.  Performed at [facility], [address], [place], [place] [address]   Blood Culture (routine x 2)     Status: None   Collection Time: 06/05/21  2:07 AM   Specimen: BLOOD RIGHT HAND  Result Value Ref Range Status   Specimen Description BLOOD RIGHT HAND  Final   Special Requests   Final    BOTTLES DRAWN AEROBIC AND ANAEROBIC Blood Culture adequate volume   Culture   Final    NO GROWTH 5 DAYS Performed at [facility], [address], [place], [place] [address]    Report Status 06/10/2021 FINAL  Final     SIGNED:   Marinda Elk, MD  [facility] 06/14/2021, 9:06 AM Pager   If 7PM-7AM, please contact night-coverage [email or web address] Password [email or web address]

## 2021-06-14 NOTE — NC FL2 (Signed)
[place] MEDICAID FL2 LEVEL OF CARE SCREENING TOOL     IDENTIFICATION  Patient Name: Dylan Weaver Birthdate: Sep 24, 1966 Sex: male Admission Date (Current Location): 06/04/2021  [facility] and [place] Number:  Producer, television/film/video and Address:  [facility] [place], [address], [place], [place] [address]      Provider Number: [id number]  Attending Physician Name and Address:  Marinda Elk, MD  Relative Name and Phone Number:       Current Level of Care: Hospital Recommended Level of Care: Skilled Nursing Facility Prior Approval Number:    Date Approved/Denied:   PASRR Number: [id number]  Discharge Plan: SNF    Current Diagnoses: Patient Active Problem List   Diagnosis Date Noted   Protein-calorie malnutrition, severe 06/14/2021   Acute liver failure with hepatic coma (HCC) 06/04/2021   Acute renal failure (ARF) (HCC) 06/04/2021   SIRS (systemic inflammatory response syndrome) (HCC) 11/29/2020   Elevated LFTs 11/29/2020   Intertrigo 11/29/2020   Prolonged QT interval 11/29/2020   Malnutrition of moderate degree 09/16/2020   Alcohol withdrawal (HCC) 09/14/2020   Alcoholic hepatitis 09/14/2020   Thrombocytopenia (HCC) 09/14/2020   Normochromic normocytic anemia 09/14/2020   Alcohol use disorder, severe, dependence (HCC) 06/19/2020   Alcohol abuse with intoxication (HCC) 06/19/2020    Orientation RESPIRATION BLADDER Height & Weight     Self, Time, Situation, Place  Normal External catheter, Incontinent Weight: 157 lb 10.1 oz (71.5 kg) Height:  6' (182.9 cm)  BEHAVIORAL SYMPTOMS/MOOD NEUROLOGICAL BOWEL NUTRITION STATUS      Incontinent Diet (please see discharge summary)  AMBULATORY STATUS COMMUNICATION OF NEEDS Skin   Limited Assist   Surgical wounds (wound incision: Groin, Back RT Ulcher, Skin Tear; Buttocks right & left, left hip proximal lateral,Back Bilateral right & left)                       Personal Care  Assistance Level of Assistance  Bathing, Feeding, Dressing Bathing Assistance: Limited assistance Feeding assistance: Limited assistance Dressing Assistance: Limited assistance     Functional Limitations Info  Sight, Hearing, Speech Sight Info: Adequate Hearing Info: Adequate Speech Info: Adequate    SPECIAL CARE FACTORS FREQUENCY  OT (By licensed OT), PT (By licensed PT)     PT Frequency: 5x per week OT Frequency: 5x per week            Contractures Contractures Info: Not present    Additional Factors Info  Code Status, Allergies Code Status Info: FULL Allergies Info: NKA           Current Medications (06/14/2021):  This is the current hospital active medication list Current Facility-Administered Medications  Medication Dose Route Frequency Provider Last Rate Last Admin   0.9 %  sodium chloride infusion (Manually program via Guardrails IV Fluids)   Intravenous Once [place], Andon J, DO       famotidine (PEPCID) tablet 40 mg  40 mg Oral BID Marinda Elk, MD   40 mg at 06/14/21 0802   feeding supplement (ENSURE ENLIVE / ENSURE PLUS) liquid 237 mL  237 mL Oral TID BM Marinda Elk, MD   237 mL at 06/14/21 0837   folic acid (FOLVITE) tablet 1 mg  1 mg Oral Daily [place], Pal Shell, DO   1 mg at 06/14/21 0803   gabapentin (NEURONTIN) capsule 100 mg  100 mg Oral TID [place], Scott J, DO   100 mg at 06/14/21 [phone]  magnesium oxide (MAG-OX) tablet 800 mg  800 mg Oral QID Marinda Elk, MD   800 mg at 06/14/21 0943   magnesium sulfate IVPB 4 g 100 mL  4 g Intravenous Once Marinda Elk, MD 50 mL/hr at 06/14/21 0942 4 g at 06/14/21 0942   midodrine (PROAMATINE) tablet 2.5 mg  2.5 mg Oral TID WC Marinda Elk, MD   2.5 mg at 06/14/21 0802   multivitamin with minerals tablet 1 tablet  1 tablet Oral Daily Darreld Mclean R, MD   1 tablet at 06/14/21 0803   ondansetron (ZOFRAN) tablet 4 mg  4 mg Oral Q6H PRN Charlsie Quest, MD       Or   ondansetron  (ZOFRAN) injection 4 mg  4 mg Intravenous Q6H PRN Darreld Mclean R, MD       sodium chloride flush (NS) 0.9 % injection 10-40 mL  10-40 mL Intracatheter Q12H Marinda Elk, MD   10 mL at 06/14/21 0805   sodium chloride flush (NS) 0.9 % injection 10-40 mL  10-40 mL Intracatheter PRN Marinda Elk, MD       sodium chloride flush (NS) 0.9 % injection 3 mL  3 mL Intravenous Q12H Darreld Mclean R, MD   3 mL at 06/13/21 2131   thiamine tablet 100 mg  100 mg Oral Daily Darreld Mclean R, MD   100 mg at 06/14/21 7867   Or   thiamine (B-1) injection 100 mg  100 mg Intravenous Daily Charlsie Quest, MD   100 mg at 06/07/21 0803     Discharge Medications: Please see discharge summary for a list of discharge medications.  Relevant Imaging Results:  Relevant Lab Results:   Additional Information SSN [id number]  Eduard Roux, LCSW

## 2021-06-14 NOTE — Progress Notes (Signed)
OT Cancellation Note  Patient Details Name: Dylan Weaver MRN: [id number] DOB: Nov 22, 1965   Cancelled Treatment:    Reason Eval/Treat Not Completed: Patient declined, no reason specified;Other (comment) Pt eating lunch on OT entry. Will follow-up as schedule permits.  Lorre Munroe 06/14/2021, 12:57 PM

## 2021-06-14 NOTE — Progress Notes (Addendum)
Physical Therapy Treatment Patient Details Name: Dylan Weaver MRN: [id number] DOB: 1966/03/12 Today's Date: 06/14/2021   History of Present Illness Pt is a 55yo male presenting to [facility] ED on 9/3 with complaints of weakness and jaundice. Found to be in acute liver failure with hepatic coma (encephalopathy). Chest xray showed possible L atelectasis. s/p colonoscopy on 9/9, finding multiple polyps in 15 cm length of colon and involving distal transverse, splenic flexure and proximal descending colon; mild diverticulosis. PMH: alcohol abuse    PT Comments    Pt requiring less assist than on last PT session but didn't go past sitting EOB due to pt feeling nauseous and anxious. Pt with soft BP's and difficult to know if this was contributing or if anxiety was playing a bigger role. Will continue to work toward advancing mobility and continue to recommend SNF.    Recommendations for follow up therapy are one component of a multi-disciplinary discharge planning process, led by the attending physician.  Recommendations may be updated based on patient status, additional functional criteria and insurance authorization.  Follow Up Recommendations  SNF;Supervision/Assistance - 24 hour     Equipment Recommendations  Rolling walker with 5" wheels;3in1 (PT);Wheelchair cushion (measurements PT);Wheelchair (measurements PT)    Recommendations for Other Services       Precautions / Restrictions Precautions Precautions: Fall Precaution Comments: soft BP     Mobility  Bed Mobility Overal bed mobility: Needs Assistance Bed Mobility: Supine to Sit;Sit to Supine     Supine to sit: Min guard;HOB elevated Sit to supine: Min guard;HOB elevated   General bed mobility comments: Assist for safety and lines    Transfers                 General transfer comment: After sitting EOB pt reports not feeling right and returned to supine. Would not sit up to try transfer despite encouragement.  Reported feeling nauseous and anxious. BP in supine and sitting 90's/high 50's-low 60's.  Ambulation/Gait                 Stairs             Wheelchair Mobility    Modified Rankin (Stroke Patients Only)       Balance Overall balance assessment: Needs assistance Sitting-balance support: No upper extremity supported;Feet supported Sitting balance-Leahy Scale: Good                                      Cognition Arousal/Alertness: Awake/alert Behavior During Therapy: Anxious Overall Cognitive Status: No family/caregiver present to determine baseline cognitive functioning Area of Impairment: Orientation;Attention;Memory;Following commands;Awareness;Problem solving;Safety/judgement                 Orientation Level: Disoriented to;Place;Situation Current Attention Level: Selective Memory: Decreased short-term memory Following Commands: Follows one step commands with increased time Safety/Judgement: Decreased awareness of safety;Decreased awareness of deficits Awareness: Intellectual Problem Solving: Difficulty sequencing;Requires verbal cues;Requires tactile cues General Comments: Pt reports being anxious.      Exercises      General Comments General comments (skin integrity, edema, etc.): BP in supine and sitting 90's/high 50's-low 60's.      Pertinent Vitals/Pain Pain Assessment: No/denies pain    Home Living                      Prior Function  PT Goals (current goals can now be found in the care plan section) Progress towards PT goals: Progressing toward goals    Frequency    Min 2X/week      PT Plan Current plan remains appropriate    Co-evaluation              AM-PAC PT "6 Clicks" Mobility   Outcome Measure  Help needed turning from your back to your side while in a flat bed without using bedrails?: A Little Help needed moving from lying on your back to sitting on the side of a flat bed  without using bedrails?: A Little Help needed moving to and from a bed to a chair (including a wheelchair)?: A Lot Help needed standing up from a chair using your arms (e.g., wheelchair or bedside chair)?: A Lot Help needed to walk in hospital room?: Total Help needed climbing 3-5 steps with a railing? : Total 6 Click Score: 12    End of Session   Activity Tolerance: Patient limited by fatigue;Other (comment) (anxiety) Patient left: in bed;with bed alarm set;with call bell/phone within reach Nurse Communication: Mobility status PT Visit Diagnosis: Muscle weakness (generalized) (M62.81);Unsteadiness on feet (R26.81);Difficulty in walking, not elsewhere classified (R26.2)     Time: 9211-9417 PT Time Calculation (min) (ACUTE ONLY): 22 min  Charges:  $Therapeutic Activity: 8-22 mins                     [facility] PT Acute Rehabilitation Services Pager [phone] Office [phone]    Dylan Weaver [facility] 06/14/2021, 1:37 PM

## 2021-06-14 NOTE — TOC Progression Note (Signed)
Transition of Care [facility]) - Progression Note    Patient Details  Name: Dylan Weaver MRN: [id number] Date of Birth: 09/23/1966  [facility]) CM/SW Contact  Eduard Roux, [place] Phone Number: 06/14/2021, 10:12 AM  Clinical Narrative:     [facility]-waiting decision Received PSARR # [id number]  Antony Blackbird, MSW, LCSW Clinical Social Worker    Expected Discharge Plan: Skilled Nursing Facility Barriers to Discharge: Inadequate or no insurance, Continued Medical Work up, SNF Pending bed offer  Expected Discharge Plan and Services Expected Discharge Plan: Skilled Nursing Facility In-house Referral: Clinical Social Work       Expected Discharge Date: 06/14/21                                     Social Determinants of Health (SDOH) Interventions    Readmission Risk Interventions No flowsheet data found.

## 2021-06-15 DIAGNOSIS — N179 Acute kidney failure, unspecified: Secondary | ICD-10-CM

## 2021-06-15 DIAGNOSIS — E43 Unspecified severe protein-calorie malnutrition: Secondary | ICD-10-CM

## 2021-06-15 DIAGNOSIS — K7201 Acute and subacute hepatic failure with coma: Secondary | ICD-10-CM | POA: Diagnosis not present

## 2021-06-15 DIAGNOSIS — F1020 Alcohol dependence, uncomplicated: Secondary | ICD-10-CM

## 2021-06-15 DIAGNOSIS — D696 Thrombocytopenia, unspecified: Secondary | ICD-10-CM

## 2021-06-15 LAB — MAGNESIUM: Magnesium: 1.5 mg/dL — ABNORMAL LOW (ref 1.7–2.4)

## 2021-06-15 NOTE — Progress Notes (Signed)
Occupational Therapy Treatment Patient Details Name: Dylan Weaver MRN: [id number] DOB: [birth year] Today's Date: 06/15/2021   History of present illness Pt is a 55yo male presenting to [facility] ED on 9/3 with complaints of weakness and jaundice. Found to be in acute liver failure with hepatic coma (encephalopathy). Chest xray showed possible L atelectasis. s/p colonoscopy on 9/9, finding multiple polyps in 15 cm length of colon and involving distal transverse, splenic flexure and proximal descending colon; mild diverticulosis. PMH: alcohol abuse   OT comments  Pt with slow progress towards goals, limited by confusion and self limiting behaviors. Pt also noted with impulsivity. With encouragement, pt able to sit EOB with Min A but even after talking about desire to walk, pt declined to attempt standing with RW citing anxiety. Pt removed gait belt and returned self to supine ending therapy session. Will continue to follow and progress OOB ADLs within pt tolerance.    Recommendations for follow up therapy are one component of a multi-disciplinary discharge planning process, led by the attending physician.  Recommendations may be updated based on patient status, additional functional criteria and insurance authorization.    Follow Up Recommendations  SNF;Supervision/Assistance - 24 hour    Equipment Recommendations  Other (comment) (defer to next venue; to be determined pending progress)    Recommendations for Other Services      Precautions / Restrictions Precautions Precautions: Fall Restrictions Weight Bearing Restrictions: No       Mobility Bed Mobility Overal bed mobility: Needs Assistance Bed Mobility: Sit to Supine;Rolling;Sidelying to Sit Rolling: Supervision Sidelying to sit: Min assist   Sit to supine: Supervision   General bed mobility comments: able to roll to side in prep for EOB then requested therapist to wait a minute before attempting further bed mobility due to  fatigue. Min A to scoot hips forward and lift trunk. Pt able to return self to supine impulsively without assist.    Transfers                 General transfer comment: Declined despite max encouragement    Balance Overall balance assessment: Needs assistance Sitting-balance support: No upper extremity supported;Feet supported Sitting balance-Leahy Scale: Fair                                     ADL either performed or assessed with clinical judgement   ADL Overall ADL's : Needs assistance/impaired                 Upper Body Dressing : Minimal assistance;Sitting Upper Body Dressing Details (indicate cue type and reason): to don posterior gown                   General ADL Comments: Attempted to guide pt in OOB attempts as pt reported desire to be able to walk. However, limited by pt cognition and self limiting behaviors. Was able to sit EOB > 8 min for application of powder to reduce itching, etc     Vision   Vision Assessment?: No apparent visual deficits   Perception     Praxis      Cognition Arousal/Alertness: Awake/alert Behavior During Therapy: Flat affect;Anxious;Restless;Impulsive Overall Cognitive Status: No family/caregiver present to determine baseline cognitive functioning Area of Impairment: Orientation;Attention;Memory;Following commands;Awareness;Problem solving;Safety/judgement                 Orientation Level: Disoriented to;Situation;Time Current Attention Level: Selective  Memory: Decreased short-term memory Following Commands: Follows one step commands inconsistently;Follows one step commands with increased time Safety/Judgement: Decreased awareness of safety;Decreased awareness of deficits Awareness: Intellectual Problem Solving: Difficulty sequencing;Requires verbal cues;Requires tactile cues;Slow processing General Comments: Pt with flat affect, impulsive with movements and noted confusion. Pt with memory  deficits (reported getting to chair this AM and says he did not eat breakfast in bed though eggs noted on bed linens). Reports anxious due to never being in this situation before, would like to walk and then would not even attempt standing. Difficult to educate and reason with importance of OOB attempts        Exercises     Shoulder Instructions       General Comments HR WFL    Pertinent Vitals/ Pain       Pain Assessment: No/denies pain  Home Living                                          Prior Functioning/Environment              Frequency  Min 2X/week        Progress Toward Goals  OT Goals(current goals can now be found in the care plan section)  Progress towards OT goals: Not progressing toward goals - comment  Acute Rehab OT Goals Patient Stated Goal: to fly out of here OT Goal Formulation: With patient Time For Goal Achievement: 06/21/21 Potential to Achieve Goals: Fair ADL Goals Pt Will Perform Grooming: with modified independence;standing Pt Will Perform Lower Body Dressing: with modified independence;sit to/from stand;sitting/lateral leans Pt Will Transfer to Toilet: with modified independence;ambulating  Plan Discharge plan remains appropriate;Frequency remains appropriate    Co-evaluation                 AM-PAC OT "6 Clicks" Daily Activity     Outcome Measure   Help from another person eating meals?: None Help from another person taking care of personal grooming?: A Little Help from another person toileting, which includes using toliet, bedpan, or urinal?: A Lot Help from another person bathing (including washing, rinsing, drying)?: A Lot Help from another person to put on and taking off regular upper body clothing?: A Little Help from another person to put on and taking off regular lower body clothing?: A Lot 6 Click Score: 16    End of Session Equipment Utilized During Treatment: Gait belt  OT Visit Diagnosis:  Unsteadiness on feet (R26.81);Other abnormalities of gait and mobility (R26.89);Muscle weakness (generalized) (M62.81);Other symptoms and signs involving cognitive function   Activity Tolerance Other (comment) (limited by cognition)   Patient Left in bed;with call bell/phone within reach;with bed alarm set   Nurse Communication Mobility status        Time: 1696-7893 OT Time Calculation (min): 16 min  Charges: OT General Charges $OT Visit: 1 Visit OT Treatments $Therapeutic Activity: 8-22 mins  Bradd Canary, OTR/L Acute Rehab Services Office: [phone]   Lorre Munroe 06/15/2021, 10:56 AM

## 2021-06-15 NOTE — Progress Notes (Signed)
PROGRESS NOTE  Dylan Weaver WLN:[id number] DOB: 03-24-1966 DOA: 06/04/2021 PCP: [name]  HPI/Recap of past 24 hours: 55 y.o. male severe alcohol abuse who presents to the ED for weakness and jaundice. On admission history was limited due to his encephalopathy and was found to be in acute liver failure related to alcohol abuse. Pt was started on IV fluids, antibiotics, PPI, octreotide (which have all being d/ced except for famotidine). Hospital course complicated by drop in hemoglobin to 5.4, FOBT was positive, required about 2U of PRBC. GI was consulted for further management.  Patient currently stable and awaiting SNF placement.    Today, patient denies any new complaints, denies any abdominal pain, nausea/vomiting, chest pain, shortness of breath, fever/chills.  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 MDF on admission was 7.6, was not a candidate for steroids Patient received prophylaxis antibiotics for GI bleeding for a total of 7 days, completed Continue midodrine He was treated conservatively and has improved   Possible GI bleed Hemoglobin currently stable On admission hemoglobin was 5.4, transfused 2 unit of packed red blood cells GI consulted, EGD as above Colonoscopy was done that showed on 06/10/2021 that showed multiple polyps mild diverticulosis nonbleeding internal hemorrhoids Received IV Protonix and octreotide, now transition to p.o.  famotidine and octreotide discontinued Outpatient follow-up with GI  Acute kidney injury Hepatorenal syndrome Resolved, creatinine back to baseline, on admission 4.4  Alcohol abuse/dependence Advised to quit Continue MVT, thiamine, folic acid S/P CIWA protocol  Hypomagnesemia Replace with PO and prn It was discussed with renal and they recommended to stop Protonix and start him on a H2 blocker famotidine  Chronic thrombocytopenia Likely 2/2 liver failure Monitor closely  Weakness/deconditioning Physical therapy evaluated the patient and recommended skilled nursing facility.   Malnutrition Type:  Nutrition Problem: Severe Malnutrition Etiology: social / environmental circumstances   Malnutrition Characteristics:  Signs/Symptoms: moderate fat depletion, severe muscle depletion, energy intake < 75% for > or equal to 3 months   Nutrition Interventions:  Interventions: Ensure Enlive (each supplement provides 350kcal and 20 grams of protein), MVI    Estimated body mass index is 21.38 kg/m as calculated from the following:   Height as of this encounter: 6' (1.829 m).   Weight as of this encounter: 71.5 kg.     Code Status: Full  Family Communication: None at bedside  Disposition Plan: Status is: Inpatient  Remains inpatient appropriate because:Inpatient level of care appropriate due to severity of illness  Dispo: The patient is from: Home              Anticipated d/c is to: SNF              Patient currently is medically stable to d/c.   Difficult to place patient No    Consultants: GI  Procedures: EGD Colonoscopy  Antimicrobials: None  DVT prophylaxis: SCD   Objective: Vitals:   06/15/21 0025 06/15/21 0441 06/15/21 0759 06/15/21 1142  BP: 100/64 100/62 94/65 98/70   Pulse: 75 70 80 80  Resp: 17 20 18  18  Temp: 97.9 F (36.6 C) 97.9 F (36.6 C) 97.9 F (36.6 C) (!) 97.4 F (36.3 C)  TempSrc: Oral Oral Oral Oral  SpO2: 98% 98% 100% 99%   Weight:      Height:       No intake or output data in the 24 hours ending 06/15/21 1230 Filed Weights   06/10/21 0413 06/12/21 0407 06/13/21 0330  Weight: 71.5 kg 71.5 kg 71.5 kg    Exam: General: NAD, jaundiced Cardiovascular: S1, S2 present Respiratory: CTAB Abdomen: Soft, nontender, nondistended, bowel sounds present Musculoskeletal: No bilateral pedal edema noted Skin: Jaundiced Psychiatry: Normal mood     Data Reviewed: CBC: Recent Labs  Lab 06/09/21 0122 06/10/21 0132  WBC 7.4 6.4  HGB 10.0* 10.0*  HCT 30.6* 30.5*  MCV 97.1 98.4  PLT 63* 73*   Basic Metabolic Panel: Recent Labs  Lab 06/10/21 0132 06/11/21 0136 06/11/21 0854 06/12/21 0939 06/13/21 0530 06/14/21 0631  NA 137 132*  --  132* 128* 131*  K 3.5 3.8  --  3.8 3.3* 3.8  CL 100 101  --  100 99 100  CO2 22 20*  --  22 22 22   GLUCOSE 82 104*  --  112* 99 96  BUN 6 6  --  6 5* 5*  CREATININE 0.61 0.76  --  0.47* 0.56* 0.53*  CALCIUM 9.0 8.2*  --  8.2* 7.8* 8.5*  MG 1.3* 1.2* 1.2*  --  1.1* 1.5*  PHOS  --   --  2.4*  --   --   --    GFR: Estimated Creatinine Clearance: 105.5 mL/min (A) (by C-G formula based on SCr of 0.53 mg/dL (L)). Liver Function Tests: Recent Labs  Lab 06/09/21 0122 06/10/21 0132 06/11/21 0854  AST 155* 175* 142*  ALT 90* 105* 92*  ALKPHOS 201* 256* 241*  BILITOT 14.7* 15.9* 13.6*  PROT 6.3* 6.8 6.1*  ALBUMIN 2.5* 2.6* 2.2*   No results for input(s): LIPASE, AMYLASE in the last 168 hours. Recent Labs  Lab 06/09/21 0926  AMMONIA 31   Coagulation Profile: Recent Labs  Lab 06/09/21 0122 06/10/21 0132 06/10/21 1125  INR 1.2 1.1 1.2   Cardiac Enzymes: No results for input(s): CKTOTAL, CKMB, CKMBINDEX, TROPONINI in the last 168 hours. BNP (last 3 results) No results for input(s): PROBNP in the last 8760 hours. HbA1C: No results for input(s): HGBA1C in the last 72 hours. CBG: No results for input(s): GLUCAP in the last 168 hours. Lipid Profile: No  results for input(s): CHOL, HDL, LDLCALC, TRIG, CHOLHDL, LDLDIRECT in the last 72 hours. Thyroid Function Tests: No results for input(s): TSH, T4TOTAL, FREET4, T3FREE, THYROIDAB in the last 72 hours. Anemia Panel: No results for input(s): VITAMINB12, FOLATE, FERRITIN, TIBC, IRON, RETICCTPCT in the last 72 hours. Urine analysis:    Component Value Date/Time   COLORURINE YELLOW 01/24/2021 1601   APPEARANCEUR CLEAR 01/24/2021 1601   LABSPEC 1.009 01/24/2021 1601   PHURINE 6.0 01/24/2021 1601   GLUCOSEU NEGATIVE 01/24/2021 1601   HGBUR NEGATIVE 01/24/2021 1601   BILIRUBINUR NEGATIVE 01/24/2021 1601   KETONESUR NEGATIVE 01/24/2021 1601   PROTEINUR NEGATIVE 01/24/2021 1601   NITRITE NEGATIVE 01/24/2021 1601   LEUKOCYTESUR NEGATIVE 01/24/2021 1601   Sepsis Labs: @LABRCNTIP (procalcitonin:4,lacticidven:4)  )No results found for this or any previous visit (from the past 240 hour(s)).    Studies: No results found.  Scheduled Meds:  famotidine  40 mg Oral BID   feeding supplement  237 mL Oral TID BM  folic acid  1 mg Oral Daily   gabapentin  100 mg Oral TID   magnesium oxide  800 mg Oral QID   midodrine  2.5 mg Oral TID WC   multivitamin with minerals  1 tablet Oral Daily   sodium chloride flush  10-40 mL Intracatheter Q12H   sodium chloride flush  3 mL Intravenous Q12H   thiamine  100 mg Oral Daily   Or   thiamine  100 mg Intravenous Daily    Continuous Infusions:   LOS: 11 days     Briant Cedar, MD [facility]  If 7PM-7AM, please contact night-coverage [email or web address] 06/15/2021, 12:30 PM

## 2021-06-15 NOTE — Progress Notes (Signed)
  Speech Language Pathology Treatment: Dysphagia  Patient Details Name: Dylan Weaver MRN: [id number] DOB: 01-10-1966 Today's Date: 06/15/2021 Time: 6244-6950 SLP Time Calculation (min) (ACUTE ONLY): 11 min  Assessment / Plan / Recommendation Clinical Impression  Pt seen at bedside to assess tolerance of advanced solid textures and thin liquids. Pt exhibited a flat affect, and perseverated on wanting to be discharged. Pt was agreeable to PO trials of solid texture and thin liquid, which he self fed. No overt s/s aspiration or report of difficulty on current diet. ST will sign off at this time. Dysphagia goals have been met. Please reconsult if needs arise.    HPI HPI: Pt is a 55 y.o. male who presented to the ED via EMS for evaluation of weakness and jaundice. CXR on admission: Linear opacity in the left lung base, favor atelectasis. Dx ARF, encephalopathy and acute liver failure thought to be 2/2 severe EtOH abuse. PMH: severe alcohol abuse.      SLP Plan  Discharge SLP treatment due to goals met       Recommendations  Diet recommendations: Regular;Thin liquid Liquids provided via: Straw;Cup Medication Administration: Whole meds with liquid Supervision: Staff to assist with self feeding;Intermittent supervision to cue for compensatory strategies Compensations: Minimize environmental distractions;Slow rate;Small sips/bites;Clear throat intermittently Postural Changes and/or Swallow Maneuvers: Seated upright 90 degrees;Upright 30-60 min after meal                Oral Care Recommendations: Oral care BID Follow up Recommendations: Skilled Nursing facility SLP Visit Diagnosis: Dysphagia, unspecified (R13.10) Plan: Discharge SLP treatment due to (comment)       GO               Gianny Killman B. Quentin Ore, [facility], [place] Speech Language Pathologist Office: [phone]  Shonna Chock 06/15/2021, 2:48 PM

## 2021-06-15 NOTE — Progress Notes (Signed)
Patients confusion has been intermittent through out the day. However, Patient with increasing confusion this afternoon, patient becoming verbally aggressive stating he is at his moms house and not at the hospital. Patient yelling in hallway and at times is not able to be redirected. Currently patient is alert to self. Patient eating supper call bell with in reach. Merina Behrendt, Randall An RN

## 2021-06-15 NOTE — TOC Progression Note (Signed)
Transition of Care [facility]) - Progression Note    Patient Details  Name: Jaelen Soth MRN: [id number] Date of Birth: [birth year]  [facility]) CM/SW Contact  Eduard Roux, [place] Phone Number: 06/15/2021, 4:18 PM  Clinical Narrative:     CSW received approval from the [place] for short term rehab at [facility] for 32 days.  CSW faxed/sent referrals to : Otilio Connors and [facility]. CSW contacted: [facility], [facility]- left voice message to return call CSW called  [employer]- unable to reach anyone  CSW will continue to follow and assist with discharge planning.  Antony Blackbird, MSW, LCSW Clinical Social Worker    Expected Discharge Plan: Skilled Nursing Facility Barriers to Discharge: Inadequate or no insurance, Continued Medical Work up, SNF Pending bed offer  Expected Discharge Plan and Services Expected Discharge Plan: Skilled Nursing Facility In-house Referral: Clinical Social Work       Expected Discharge Date: 06/14/21                                     Social Determinants of Health (SDOH) Interventions    Readmission Risk Interventions No flowsheet data found.

## 2021-06-16 DIAGNOSIS — K7201 Acute and subacute hepatic failure with coma: Secondary | ICD-10-CM | POA: Diagnosis not present

## 2021-06-16 DIAGNOSIS — E43 Unspecified severe protein-calorie malnutrition: Secondary | ICD-10-CM | POA: Diagnosis not present

## 2021-06-16 DIAGNOSIS — N179 Acute kidney failure, unspecified: Secondary | ICD-10-CM | POA: Diagnosis not present

## 2021-06-16 DIAGNOSIS — F1020 Alcohol dependence, uncomplicated: Secondary | ICD-10-CM | POA: Diagnosis not present

## 2021-06-16 LAB — BASIC METABOLIC PANEL
Anion gap: 13 (ref 5–15)
BUN: <5 mg/dL — ABNORMAL LOW (ref 6–20)
CO2: 22 mmol/L (ref 22–32)
Calcium: 8.8 mg/dL — ABNORMAL LOW (ref 8.9–10.3)
Chloride: 96 mmol/L — ABNORMAL LOW (ref 98–111)
Creatinine, Ser: 0.48 mg/dL — ABNORMAL LOW (ref 0.61–1.24)
GFR, Estimated: >60 mL/min (ref >60)
Glucose, Bld: 84 mg/dL (ref 70–99)
Potassium: 3.4 mmol/L — ABNORMAL LOW (ref 3.5–5.1)
Sodium: 131 mmol/L — ABNORMAL LOW (ref 135–145)

## 2021-06-16 LAB — CBC WITH DIFFERENTIAL/PLATELET
Abs Immature Granulocytes: 0.06 10*3/uL (ref 0.00–0.07)
Basophils Absolute: 0.1 10*3/uL (ref 0.0–0.1)
Basophils Relative: 1 %
Eosinophils Absolute: 0.2 10*3/uL (ref 0.0–0.5)
Eosinophils Relative: 2 %
HCT: 25.8 % — ABNORMAL LOW (ref 39.0–52.0)
Hemoglobin: 8.4 g/dL — ABNORMAL LOW (ref 13.0–17.0)
Immature Granulocytes: 1 %
Lymphocytes Relative: 15 %
Lymphs Abs: 1.7 10*3/uL (ref 0.7–4.0)
MCH: 32.6 pg (ref 26.0–34.0)
MCHC: 32.6 g/dL (ref 30.0–36.0)
MCV: 100 fL (ref 80.0–100.0)
Monocytes Absolute: 0.9 10*3/uL (ref 0.1–1.0)
Monocytes Relative: 8 %
Neutro Abs: 8.5 10*3/uL — ABNORMAL HIGH (ref 1.7–7.7)
Neutrophils Relative %: 73 %
Platelets: 439 10*3/uL — ABNORMAL HIGH (ref 150–400)
RBC: 2.58 MIL/uL — ABNORMAL LOW (ref 4.22–5.81)
RDW: 18.4 % — ABNORMAL HIGH (ref 11.5–15.5)
WBC: 11.4 10*3/uL — ABNORMAL HIGH (ref 4.0–10.5)
nRBC: 0 % (ref 0.0–0.2)

## 2021-06-16 LAB — MAGNESIUM: Magnesium: 1.2 mg/dL — ABNORMAL LOW (ref 1.7–2.4)

## 2021-06-16 LAB — OCCULT BLOOD X 1 CARD TO LAB, STOOL: Fecal Occult Bld: POSITIVE — AB

## 2021-06-16 LAB — AMMONIA: Ammonia: 29 umol/L (ref 9–35)

## 2021-06-16 MED ORDER — POTASSIUM CHLORIDE CRYS ER 20 MEQ PO TBCR
40.0000 meq | EXTENDED_RELEASE_TABLET | Freq: Once | ORAL | Status: AC
  Administered 2021-06-16: 40 meq via ORAL
  Filled 2021-06-16: qty 2

## 2021-06-16 MED ORDER — MAGNESIUM SULFATE 4 GM/100ML IV SOLN
4.0000 g | Freq: Once | INTRAVENOUS | Status: AC
  Administered 2021-06-16: 4 g via INTRAVENOUS
  Filled 2021-06-16: qty 100

## 2021-06-16 NOTE — Progress Notes (Signed)
PROGRESS NOTE  Dylan Weaver UKG:[id number] DOB: [birth year] DOA: 06/04/2021 PCP: [name]  HPI/Recap of past 24 hours: 55 y.o. male severe alcohol abuse who presents to the ED for weakness and jaundice. On admission history was limited due to his encephalopathy and was found to be in acute liver failure related to alcohol abuse. Pt was started on IV fluids, antibiotics, PPI, octreotide (which have all being d/ced except for famotidine). Hospital course complicated by drop in hemoglobin to 5.4, FOBT was positive, required about 2U of PRBC. GI was consulted for further management.  Patient currently stable and awaiting SNF placement.    Patient noted to be intermittently confused. Continues to deny any abdominal pain, N/V. Noted to have some blood mixed in his stool, FOBT ordered. 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 MDF on admission was 7.6, was not a candidate for steroids Repeat ammonia level on 06/16/21 WNL Patient received prophylaxis antibiotics for GI bleeding for a total of 7 days, completed Continue midodrine   Possible GI bleed Hemoglobin with mild drop On admission hemoglobin was 5.4, transfused 2 unit of packed red blood cells GI consulted, EGD as above Colonoscopy was done that showed on 06/10/2021 that showed multiple polyps mild diverticulosis nonbleeding internal hemorrhoids Received IV Protonix and octreotide,  now transition to p.o. famotidine and octreotide discontinued Outpatient follow-up with GI Daily cbc  Acute kidney injury Hepatorenal syndrome Resolved, creatinine back to baseline, on admission 4.4  Alcohol abuse/dependence Advised to quit Continue MVT, thiamine, folic acid S/P CIWA protocol  Hypomagnesemia/hypokalemia Replaced prn It was discussed with renal and they recommended to stop Protonix and start him on a H2 blocker famotidine  Chronic thrombocytopenia Likely 2/2 liver failure Monitor closely  Weakness/deconditioning Physical therapy evaluated the patient and recommended skilled nursing facility.   Malnutrition Type:  Nutrition Problem: Severe Malnutrition Etiology: social / environmental circumstances   Malnutrition Characteristics:  Signs/Symptoms: moderate fat depletion, severe muscle depletion, energy intake < 75% for > or equal to 3 months   Nutrition Interventions:  Interventions: Ensure Enlive (each supplement provides 350kcal and 20 grams of protein), MVI    Estimated body mass index is 21.38 kg/m as calculated from the following:   Height as of this encounter: 6' (1.829 m).   Weight as of this encounter: 71.5 kg.     Code Status: Full  Family Communication: None at bedside  Disposition Plan: Status is: Inpatient  Remains inpatient appropriate because:Inpatient level of care appropriate due to severity of illness  Dispo: The patient is from: Home              Anticipated d/c is to: SNF              Patient currently is medically stable to d/c.   Difficult to place patient No    Consultants: GI  Procedures: EGD Colonoscopy  Antimicrobials: None  DVT prophylaxis: SCD   Objective: Vitals:   06/16/21 0326 06/16/21 0744 06/16/21 1244 06/16/21 1600  BP: 104/73 101/70 100/74 90/62  Pulse: 72  89  75  Resp: 20 20 16 18   Temp: 97.6 F (36.4 C) 98.1 F (36.7 C) 97.7 F (36.5 C) (!) 97.4 F (36.3 C)  TempSrc: Oral Oral Oral  Oral  SpO2: 97% 99% 94% 96%  Weight:      Height:        Intake/Output Summary (Last 24 hours) at 06/16/2021 1809 Last data filed at 06/16/2021 1733 Gross per 24 hour  Intake 458 ml  Output 1900 ml  Net -1442 ml   Filed Weights   06/10/21 0413 06/12/21 0407 06/13/21 0330  Weight: 71.5 kg 71.5 kg 71.5 kg    Exam: General: NAD, jaundiced Cardiovascular: S1, S2 present Respiratory: CTAB Abdomen: Soft, nontender, nondistended, bowel sounds present Musculoskeletal: No bilateral pedal edema noted Skin: Jaundiced Psychiatry: Normal mood     Data Reviewed: CBC: Recent Labs  Lab 06/10/21 0132 06/16/21 0500  WBC 6.4 11.4*  NEUTROABS  --  8.5*  HGB 10.0* 8.4*  HCT 30.5* 25.8*  MCV 98.4 100.0  PLT 73* 439*   Basic Metabolic Panel: Recent Labs  Lab 06/11/21 0136 06/11/21 0854 06/12/21 0939 06/13/21 0530 06/14/21 0631 06/15/21 0948 06/16/21 0500  NA 132*  --  132* 128* 131*  --  131*  K 3.8  --  3.8 3.3* 3.8  --  3.4*  CL 101  --  100 99 100  --  96*  CO2 20*  --  22 22 22   --  22  GLUCOSE 104*  --  112* 99 96  --  84  BUN 6  --  6 5* 5*  --  <5*  CREATININE 0.76  --  0.47* 0.56* 0.53*  --  0.48*  CALCIUM 8.2*  --  8.2* 7.8* 8.5*  --  8.8*  MG 1.2* 1.2*  --  1.1* 1.5* 1.5* 1.2*  PHOS  --  2.4*  --   --   --   --   --    GFR: Estimated Creatinine Clearance: 105.5 mL/min (A) (by C-G formula based on SCr of 0.48 mg/dL (L)). Liver Function Tests: Recent Labs  Lab 06/10/21 0132 06/11/21 0854  AST 175* 142*  ALT 105* 92*  ALKPHOS 256* 241*  BILITOT 15.9* 13.6*  PROT 6.8 6.1*  ALBUMIN 2.6* 2.2*   No results for input(s): LIPASE, AMYLASE in the last 168 hours. Recent Labs  Lab 06/16/21 1004  AMMONIA 29   Coagulation Profile: Recent Labs  Lab 06/10/21 0132 06/10/21 1125  INR 1.1 1.2   Cardiac Enzymes: No results for input(s): CKTOTAL, CKMB, CKMBINDEX, TROPONINI in the last 168 hours. BNP (last 3 results) No results for input(s): PROBNP in the last  8760 hours. HbA1C: No results for input(s): HGBA1C in the last 72 hours. CBG: No results for input(s): GLUCAP in the last 168 hours. Lipid Profile: No results for input(s): CHOL, HDL, LDLCALC, TRIG, CHOLHDL, LDLDIRECT in the last 72 hours. Thyroid Function Tests: No results for input(s): TSH, T4TOTAL, FREET4, T3FREE, THYROIDAB in the last 72 hours. Anemia Panel: No results for input(s): VITAMINB12, FOLATE, FERRITIN, TIBC, IRON, RETICCTPCT in the last 72 hours. Urine analysis:    Component Value Date/Time   COLORURINE YELLOW 01/24/2021 1601   APPEARANCEUR CLEAR 01/24/2021 1601   LABSPEC 1.009 01/24/2021 1601   PHURINE 6.0 01/24/2021 1601   GLUCOSEU NEGATIVE 01/24/2021 1601   HGBUR NEGATIVE 01/24/2021 1601   BILIRUBINUR NEGATIVE 01/24/2021 1601   KETONESUR NEGATIVE 01/24/2021 1601   PROTEINUR NEGATIVE 01/24/2021 1601   NITRITE NEGATIVE  01/24/2021 1601   LEUKOCYTESUR NEGATIVE 01/24/2021 1601   Sepsis Labs: @LABRCNTIP (procalcitonin:4,lacticidven:4)  )No results found for this or any previous visit (from the past 240 hour(s)).    Studies: No results found.  Scheduled Meds:  famotidine  40 mg Oral BID   feeding supplement  237 mL Oral TID BM   folic acid  1 mg Oral Daily   gabapentin  100 mg Oral TID   midodrine  2.5 mg Oral TID WC   multivitamin with minerals  1 tablet Oral Daily   sodium chloride flush  10-40 mL Intracatheter Q12H   sodium chloride flush  3 mL Intravenous Q12H   thiamine  100 mg Oral Daily    Continuous Infusions:   LOS: 12 days     , MD [facility]  If 7PM-7AM, please contact night-coverage [email or web address] 06/16/2021, 6:09 PM

## 2021-06-16 NOTE — Progress Notes (Signed)
   06/16/21 1030  Clinical Encounter Type  Visited With Patient  Visit Type Initial  Referral From Other (Comment) (Physical therapist, Adela Lank)  Consult/Referral To [employer] responded. The patient was asleep. Will follow up. This note was prepared by Deneen Harts, M.Div..  For questions please contact by phone [phone].

## 2021-06-16 NOTE — Progress Notes (Signed)
   06/16/21 1700  Clinical Encounter Type  Visited With Patient  Visit Type Follow-up  Referral From Nurse  Consult/Referral To Chaplain   Chaplain responded. Patient said he did not request chaplain. Chaplain advised Chaplain will remain available for follow-up spiritual/emotional support as needed. This note was prepared by Deneen Harts, M.Div..  For questions please contact by phone [phone].

## 2021-06-16 NOTE — Progress Notes (Signed)
Physical Therapy Treatment Patient Details Name: Dylan Weaver MRN: [id number] DOB: 1966-05-31 Today's Date: 06/16/2021   History of Present Illness Pt is a 55 y.o. male admitted 06/04/21 with weakness, jaundice. Found to have ARF with hepatic encephalopathy, possible GIB. CXR with possible L atelectasis. S/p colonoscopy 9/9 which showed multiple polyps, mild diverticulosis. PMH includes ETOH abuse.   PT Comments    Pt not progressing with mobility. Pt demonstrates mod indep with bed-level activity and fair sitting EOB balance; when it comes time to attempt standing, pt clearly anxious and self-limiting attempts to mobilize beyond bed-level. Significant increased time discussing importance of this and strategies to perform, but still opting to return back to supine to have BM, mod indep for bed-level ADL tasks with assist for set-up. Pt demonstrates poor awareness and insight into current situation. Will continue to follow acutely to progress OOB activity if pt is agreeable next session.     Recommendations for follow up therapy are one component of a multi-disciplinary discharge planning process, led by the attending physician.  Recommendations may be updated based on patient status, additional functional criteria and insurance authorization.  Follow Up Recommendations  SNF;Supervision/Assistance - 24 hour     Equipment Recommendations  Rolling walker with 5" wheels;3in1 (PT);Wheelchair cushion (measurements PT);Wheelchair (measurements PT)    Recommendations for Other Services  Psychiatric consult (MD notified), Chaplain visit     Precautions / Restrictions Precautions Precautions: Fall Restrictions Weight Bearing Restrictions: No     Mobility  Bed Mobility Overal bed mobility: Modified Independent Bed Mobility: Rolling;Supine to Sit;Sit to Supine           General bed mobility comments: Pt mod indep with all aspects of bed mobility, including multiple supine<>sit,  sidelying, rolling, and bridging hips to clean himself up after having bowel movement    Transfers                 General transfer comment: pt adamantly declined for multiple reasons despite max encouragement and education  Ambulation/Gait                 Stairs             Wheelchair Mobility    Modified Rankin (Stroke Patients Only)       Balance Overall balance assessment: Needs assistance Sitting-balance support: No upper extremity supported;Feet supported Sitting balance-Leahy Scale: Good                                      Cognition Arousal/Alertness: Awake/alert Behavior During Therapy: Flat affect;Restless;Impulsive;Anxious Overall Cognitive Status: No family/caregiver present to determine baseline cognitive functioning Area of Impairment: Orientation;Attention;Memory;Following commands;Awareness;Problem solving;Safety/judgement                               General Comments: Pt appropriately conversant initially, requires max encouragement to sit EOB, once sitting EOB impulsive with movement supine<>sit despite cues to stay sitting; many avoidant behaviors when it comes to standing attempts (c/o nausea, dizziness, pain, needing to order a snack); increased time discussing importance of mobility and pt's anxiety related to getting OOB, pt seems to have poor awareness and insight into situation; pt opting to lay back down to have bowel movement in bed rather than pivot to a [facility]      Exercises Other Exercises Other Exercises: Pt saying, "I'll sit here and  do this" - independently performing LAQ, ankle pumps while sitting EOB; encouraged this on his own time, but more important to start WB through BLEs with standing activity during PT session    General Comments General comments (skin integrity, edema, etc.): Discussed potential need for LTC/ALF if mobility does not progress, pt reports "That's fine with me"       Pertinent Vitals/Pain Pain Assessment: Faces Faces Pain Scale: No hurt Pain Intervention(s): Monitored during session    Home Living                      Prior Function            PT Goals (current goals can now be found in the care plan section) Progress towards PT goals: Not progressing toward goals - comment (self-limiting, anxiety)    Frequency    Min 2X/week      PT Plan Current plan remains appropriate    Co-evaluation              AM-PAC PT "6 Clicks" Mobility   Outcome Measure  Help needed turning from your back to your side while in a flat bed without using bedrails?: None Help needed moving from lying on your back to sitting on the side of a flat bed without using bedrails?: None Help needed moving to and from a bed to a chair (including a wheelchair)?: A Lot Help needed standing up from a chair using your arms (e.g., wheelchair or bedside chair)?: A Lot Help needed to walk in hospital room?: Total Help needed climbing 3-5 steps with a railing? : Total 6 Click Score: 14    End of Session   Activity Tolerance: Other (comment) (self-limiting, anxiety related to getting OOB) Patient left: in bed;with bed alarm set;with call bell/phone within reach Nurse Communication: Mobility status PT Visit Diagnosis: Muscle weakness (generalized) (M62.81);Unsteadiness on feet (R26.81);Difficulty in walking, not elsewhere classified (R26.2)     Time: 0263-7858 PT Time Calculation (min) (ACUTE ONLY): 28 min  Charges:  $Therapeutic Activity: 8-22 mins $Self Care/Home Management: 8-22                     Ina Homes, PT, DPT Acute Rehabilitation Services  Pager [phone] Office [phone]  Malachy Chamber 06/16/2021, 12:58 PM

## 2021-06-17 DIAGNOSIS — N179 Acute kidney failure, unspecified: Secondary | ICD-10-CM | POA: Diagnosis not present

## 2021-06-17 DIAGNOSIS — F1020 Alcohol dependence, uncomplicated: Secondary | ICD-10-CM | POA: Diagnosis not present

## 2021-06-17 DIAGNOSIS — K7201 Acute and subacute hepatic failure with coma: Secondary | ICD-10-CM | POA: Diagnosis not present

## 2021-06-17 DIAGNOSIS — E43 Unspecified severe protein-calorie malnutrition: Secondary | ICD-10-CM | POA: Diagnosis not present

## 2021-06-17 LAB — CBC WITH DIFFERENTIAL/PLATELET
Abs Immature Granulocytes: 0.06 10*3/uL (ref 0.00–0.07)
Basophils Absolute: 0.1 10*3/uL (ref 0.0–0.1)
Basophils Relative: 1 %
Eosinophils Absolute: 0.2 10*3/uL (ref 0.0–0.5)
Eosinophils Relative: 3 %
HCT: 25.6 % — ABNORMAL LOW (ref 39.0–52.0)
Hemoglobin: 8.3 g/dL — ABNORMAL LOW (ref 13.0–17.0)
Immature Granulocytes: 1 %
Lymphocytes Relative: 18 %
Lymphs Abs: 1.7 10*3/uL (ref 0.7–4.0)
MCH: 32.9 pg (ref 26.0–34.0)
MCHC: 32.4 g/dL (ref 30.0–36.0)
MCV: 101.6 fL — ABNORMAL HIGH (ref 80.0–100.0)
Monocytes Absolute: 0.9 10*3/uL (ref 0.1–1.0)
Monocytes Relative: 10 %
Neutro Abs: 6.1 10*3/uL (ref 1.7–7.7)
Neutrophils Relative %: 67 %
Platelets: 472 10*3/uL — ABNORMAL HIGH (ref 150–400)
RBC: 2.52 MIL/uL — ABNORMAL LOW (ref 4.22–5.81)
RDW: 18.9 % — ABNORMAL HIGH (ref 11.5–15.5)
WBC: 9 10*3/uL (ref 4.0–10.5)
nRBC: 0 % (ref 0.0–0.2)

## 2021-06-17 LAB — MAGNESIUM: Magnesium: 1.7 mg/dL (ref 1.7–2.4)

## 2021-06-17 LAB — BASIC METABOLIC PANEL
Anion gap: 11 (ref 5–15)
BUN: 6 mg/dL (ref 6–20)
CO2: 22 mmol/L (ref 22–32)
Calcium: 9.1 mg/dL (ref 8.9–10.3)
Chloride: 99 mmol/L (ref 98–111)
Creatinine, Ser: 0.51 mg/dL — ABNORMAL LOW (ref 0.61–1.24)
GFR, Estimated: >60 mL/min (ref >60)
Glucose, Bld: 97 mg/dL (ref 70–99)
Potassium: 3.6 mmol/L (ref 3.5–5.1)
Sodium: 132 mmol/L — ABNORMAL LOW (ref 135–145)

## 2021-06-17 LAB — GLUCOSE, CAPILLARY: Glucose-Capillary: 119 mg/dL — ABNORMAL HIGH (ref 70–99)

## 2021-06-17 NOTE — TOC Progression Note (Signed)
Transition of Care [facility]) - Progression Note    Patient Details  Name: Dylan Weaver MRN: [id number] Date of Birth: [birth year]  [facility]) CM/SW Contact  Eduard Roux, [place] Phone Number: 06/17/2021, 4:56 PM  Clinical Narrative:     Patient has no bed offers- and appearing to be difficult to place: barrier - limited SNFs choices -search restricted by only having [facility] benefits and can only pursue certain SNFs &  severe ETOH abuse & behaviors   4:23 pm- [facility] - left voice message 4:34 pm- -[facility]- left voice message   4:35 pm- [facility]  - No answer - faxed referral to [facility]  4:45pm - [facility] - No answer -unable to leave voice mail- mail box full  TOC will continue to follow and assist with discharge planning.  Antony Blackbird, MSW, LCSW Clinical Social Worker    Expected Discharge Plan: Skilled Nursing Facility Barriers to Discharge: Inadequate or no insurance, Continued Medical Work up, SNF Pending bed offer  Expected Discharge Plan and Services Expected Discharge Plan: Skilled Nursing Facility In-house Referral: Clinical Social Work       Expected Discharge Date: 06/14/21                                     Social Determinants of Health (SDOH) Interventions    Readmission Risk Interventions No flowsheet data found.

## 2021-06-17 NOTE — Progress Notes (Signed)
Occupational Therapy Treatment Patient Details Name: Dylan Weaver MRN: [id number] DOB: Nov 07, 1965 Today's Date: 06/17/2021   History of present illness Pt is a 55 y.o. male admitted 06/04/21 with weakness, jaundice. Found to have ARF with hepatic encephalopathy, possible GIB. CXR with possible L atelectasis. S/p colonoscopy 9/9 which showed multiple polyps, mild diverticulosis. PMH includes ETOH abuse.   OT comments  Pt. Seen for skilled OT treatment session.  Able to complete bed mobility without assistance.  Encouragement and re direction for focus on sit/stand with pivot  to recliner.  Pt. Able to complete with min/mod a.     Recommendations for follow up therapy are one component of a multi-disciplinary discharge planning process, led by the attending physician.  Recommendations may be updated based on patient status, additional functional criteria and insurance authorization.    Follow Up Recommendations  SNF;Supervision/Assistance - 24 hour    Equipment Recommendations  Other (comment)    Recommendations for Other Services      Precautions / Restrictions Precautions Precautions: Fall Precaution Comments: soft BP       Mobility Bed Mobility Overal bed mobility: Modified Independent Bed Mobility: Rolling;Supine to Sit Rolling: Supervision Sidelying to sit: Min guard Supine to sit: Min guard;HOB elevated          Transfers Overall transfer level: Needs assistance Equipment used: 1 person hand held assist;None Transfers: Sit to/from [employer] Sit to Stand: Min assist;Mod assist Stand pivot transfers: Min assist;Mod assist       General transfer comment: cues for attempting to stand, lots of encouragement, reaching for arm rests of recliner appropriately without cues for pivot. voiced happiness with completing the transfer once he was seated in chair    Balance                                           ADL either  performed or assessed with clinical judgement   ADL Overall ADL's : Needs assistance/impaired                     Lower Body Dressing: Minimal assistance;Sitting/lateral leans   Toilet Transfer: Minimal assistance;Stand-pivot Toilet Transfer Details (indicate cue type and reason): simulated from eob transfer to recliner (no device, cues for hand placement to initiate the pivot)         Functional mobility during ADLs: Minimal assistance;Moderate assistance General ADL Comments: pt. with cited fear of falling but also some impulsivity noted.  required frequent re direction and encouragement but was able to complete the task     Vision       Perception     Praxis      Cognition Arousal/Alertness: Awake/alert Behavior During Therapy: Flat affect;Restless;Impulsive;Anxious Overall Cognitive Status: No family/caregiver present to determine baseline cognitive functioning Area of Impairment: Orientation;Attention;Memory;Following commands;Awareness;Problem solving;Safety/judgement                               General Comments: mostly appropriate conversation but some confusion "once i prove i can get up that means we can leave and go to the store right"        Exercises     Shoulder Instructions       General Comments      Pertinent Vitals/ Pain       Pain Assessment: No/denies pain  Home  Living                                          Prior Functioning/Environment              Frequency  Min 2X/week        Progress Toward Goals  OT Goals(current goals can now be found in the care plan section)  Progress towards OT goals: Progressing toward goals     Plan Discharge plan remains appropriate;Frequency remains appropriate    Co-evaluation                 AM-PAC OT "6 Clicks" Daily Activity     Outcome Measure   Help from another person eating meals?: None Help from another person taking care of personal  grooming?: A Little Help from another person toileting, which includes using toliet, bedpan, or urinal?: A Lot Help from another person bathing (including washing, rinsing, drying)?: A Lot Help from another person to put on and taking off regular upper body clothing?: A Little Help from another person to put on and taking off regular lower body clothing?: A Lot 6 Click Score: 16    End of Session Equipment Utilized During Treatment: Gait belt  OT Visit Diagnosis: Unsteadiness on feet (R26.81);Other abnormalities of gait and mobility (R26.89);Muscle weakness (generalized) (M62.81);Other symptoms and signs involving cognitive function   Activity Tolerance     Patient Left in chair;with call bell/phone within reach;with chair alarm set;with nursing/sitter in room   Nurse Communication Other (comment) (reviewed with rn who was present at end of session how we had transfered to the recliner to ensure safe return back.)        Time: 0911-0931 OT Time Calculation (min): 20 min  Charges: OT General Charges $OT Visit: 1 Visit OT Treatments $Self Care/Home Management : 8-22 mins  Boneta Lucks, COTA/L Acute Rehabilitation [phone]   Salvadore Oxford 06/17/2021, 12:09 PM

## 2021-06-17 NOTE — Progress Notes (Signed)
PROGRESS NOTE  Dylan Weaver HYI:[id number] DOB: 1966/09/06 DOA: 06/04/2021 PCP: [name]  HPI/Recap of past 24 hours: 55 y.o. male severe alcohol abuse who presents to the ED for weakness and jaundice. On admission history was limited due to his encephalopathy and was found to be in acute liver failure related to alcohol abuse. Pt was started on IV fluids, antibiotics, PPI, octreotide (which have all being d/ced except for famotidine). Hospital course complicated by drop in hemoglobin to 5.4, FOBT was positive, required about 2U of PRBC. GI was consulted for further management.  Patient currently stable and awaiting SNF placement.    Patient noted to have some specks of blood in stool yesterday. Continues to deny any new complaints, intermittently confused as per floor staff.   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 MDF on admission was 7.6, was not a candidate for steroids Repeat ammonia level on 06/16/21 WNL Patient received prophylaxis antibiotics for GI bleeding for a total of 7 days, completed Continue midodrine   Possible GI bleed Hemoglobin with mild drop On admission hemoglobin was 5.4, transfused 2 unit of packed red blood cells GI consulted, EGD as above Colonoscopy was done that showed on 06/10/2021 that showed multiple polyps mild diverticulosis nonbleeding internal hemorrhoids Received IV Protonix and octreotide, now transition to p.o.  famotidine and octreotide discontinued Outpatient follow-up with GI Daily cbc  Acute kidney injury Hepatorenal syndrome Resolved, creatinine back to baseline, on admission 4.4  Alcohol abuse/dependence Advised to quit Continue MVT, thiamine, folic acid S/P CIWA protocol  Hypomagnesemia/hypokalemia Replaced prn It was discussed with renal and they recommended to stop Protonix and start him on a H2 blocker famotidine  Chronic thrombocytopenia Likely 2/2 liver failure Monitor closely  Weakness/deconditioning Physical therapy evaluated the patient and recommended skilled nursing facility.   Malnutrition Type:  Nutrition Problem: Severe Malnutrition Etiology: social / environmental circumstances   Malnutrition Characteristics:  Signs/Symptoms: moderate fat depletion, severe muscle depletion, energy intake < 75% for > or equal to 3 months   Nutrition Interventions:  Interventions: Ensure Enlive (each supplement provides 350kcal and 20 grams of protein), MVI    Estimated body mass index is 21.38 kg/m as calculated from the following:   Height as of this encounter: 6' (1.829 m).   Weight as of this encounter: 71.5 kg.     Code Status: Full  Family Communication: None at bedside  Disposition Plan: Status is: Inpatient  Remains inpatient appropriate because:Inpatient level of care appropriate due to severity of illness  Dispo: The patient is from: Home              Anticipated d/c is to: SNF              Patient currently is medically stable to d/c.   Difficult to place patient No    Consultants: GI  Procedures: EGD Colonoscopy  Antimicrobials: None  DVT prophylaxis: SCD   Objective: Vitals:   06/17/21 0425 06/17/21 0901 06/17/21 1233 06/17/21 1736  BP: 98/68 (!) 96/58 99/72 95/75   Pulse: 70 72 77 84  Resp: 18 20 20 18   Temp: 97.7 F (36.5 C) 97.8 F (36.6 C) 98.1 F (36.7 C) 98 F (36.7 C)  TempSrc: Oral Oral Oral Oral  SpO2: 97% 96% 97%  99%  Weight:      Height:        Intake/Output Summary (Last 24 hours) at 06/17/2021 1828 Last data filed at 06/17/2021 0438 Gross per 24 hour  Intake --  Output 800 ml  Net -800 ml   Filed Weights   06/10/21 0413 06/12/21 0407 06/13/21 0330  Weight: 71.5 kg 71.5 kg 71.5 kg    Exam: General: NAD, jaundiced Cardiovascular: S1, S2 present Respiratory: CTAB Abdomen: Soft, nontender, nondistended, bowel sounds present Musculoskeletal: No bilateral pedal edema noted Skin: Jaundiced Psychiatry: Intermittently confused    Data Reviewed: CBC: Recent Labs  Lab 06/16/21 0500 06/17/21 0420  WBC 11.4* 9.0  NEUTROABS 8.5* 6.1  HGB 8.4* 8.3*  HCT 25.8* 25.6*  MCV 100.0 101.6*  PLT 439* 472*   Basic Metabolic Panel: Recent Labs  Lab 06/11/21 0854 06/12/21 0939 06/13/21 0530 06/14/21 0631 06/15/21 0948 06/16/21 0500 06/17/21 0420  NA  --  132* 128* 131*  --  131* 132*  K  --  3.8 3.3* 3.8  --  3.4* 3.6  CL  --  100 99 100  --  96* 99  CO2  --  22 22 22   --  22 22  GLUCOSE  --  112* 99 96  --  84 97  BUN  --  6 5* 5*  --  <5* 6  CREATININE  --  0.47* 0.56* 0.53*  --  0.48* 0.51*  CALCIUM  --  8.2* 7.8* 8.5*  --  8.8* 9.1  MG 1.2*  --  1.1* 1.5* 1.5* 1.2* 1.7  PHOS 2.4*  --   --   --   --   --   --    GFR: Estimated Creatinine Clearance: 105.5 mL/min (A) (by C-G formula based on SCr of 0.51 mg/dL (L)). Liver Function Tests: Recent Labs  Lab 06/11/21 0854  AST 142*  ALT 92*  ALKPHOS 241*  BILITOT 13.6*  PROT 6.1*  ALBUMIN 2.2*   No results for input(s): LIPASE, AMYLASE in the last 168 hours. Recent Labs  Lab 06/16/21 1004  AMMONIA 29   Coagulation Profile: No results for input(s): INR, PROTIME in the last 168 hours.  Cardiac Enzymes: No results for input(s): CKTOTAL, CKMB, CKMBINDEX, TROPONINI in the last 168 hours. BNP (last 3 results) No results for input(s): PROBNP in the last 8760 hours. HbA1C: No results for input(s): HGBA1C in the last 72  hours. CBG: No results for input(s): GLUCAP in the last 168 hours. Lipid Profile: No results for input(s): CHOL, HDL, LDLCALC, TRIG, CHOLHDL, LDLDIRECT in the last 72 hours. Thyroid Function Tests: No results for input(s): TSH, T4TOTAL, FREET4, T3FREE, THYROIDAB in the last 72 hours. Anemia Panel: No results for input(s): VITAMINB12, FOLATE, FERRITIN, TIBC, IRON, RETICCTPCT in the last 72 hours. Urine analysis:    Component Value Date/Time   COLORURINE YELLOW 01/24/2021 1601   APPEARANCEUR CLEAR 01/24/2021 1601   LABSPEC 1.009 01/24/2021 1601   PHURINE 6.0 01/24/2021 1601   GLUCOSEU NEGATIVE 01/24/2021 1601   HGBUR NEGATIVE 01/24/2021 1601   BILIRUBINUR NEGATIVE 01/24/2021 1601   KETONESUR NEGATIVE 01/24/2021 1601   PROTEINUR NEGATIVE 01/24/2021 1601   NITRITE NEGATIVE 01/24/2021 1601   LEUKOCYTESUR NEGATIVE 01/24/2021 1601   Sepsis Labs: @LABRCNTIP (procalcitonin:4,lacticidven:4)  )No results found for this  or any previous visit (from the past 240 hour(s)).    Studies: No results found.  Scheduled Meds:  famotidine  40 mg Oral BID   feeding supplement  237 mL Oral TID BM   folic acid  1 mg Oral Daily   gabapentin  100 mg Oral TID   midodrine  2.5 mg Oral TID WC   multivitamin with minerals  1 tablet Oral Daily   sodium chloride flush  10-40 mL Intracatheter Q12H   sodium chloride flush  3 mL Intravenous Q12H   thiamine  100 mg Oral Daily    Continuous Infusions:   LOS: 13 days     Briant Cedar, MD [facility]  If 7PM-7AM, please contact night-coverage [email or web address] 06/17/2021, 6:28 PM

## 2021-06-18 DIAGNOSIS — E43 Unspecified severe protein-calorie malnutrition: Secondary | ICD-10-CM | POA: Diagnosis not present

## 2021-06-18 DIAGNOSIS — F1020 Alcohol dependence, uncomplicated: Secondary | ICD-10-CM | POA: Diagnosis not present

## 2021-06-18 DIAGNOSIS — N179 Acute kidney failure, unspecified: Secondary | ICD-10-CM | POA: Diagnosis not present

## 2021-06-18 DIAGNOSIS — K7201 Acute and subacute hepatic failure with coma: Secondary | ICD-10-CM | POA: Diagnosis not present

## 2021-06-18 LAB — CBC WITH DIFFERENTIAL/PLATELET
Abs Immature Granulocytes: 0.06 10*3/uL (ref 0.00–0.07)
Basophils Absolute: 0.1 10*3/uL (ref 0.0–0.1)
Basophils Relative: 1 %
Eosinophils Absolute: 0.3 10*3/uL (ref 0.0–0.5)
Eosinophils Relative: 4 %
HCT: 26 % — ABNORMAL LOW (ref 39.0–52.0)
Hemoglobin: 8.6 g/dL — ABNORMAL LOW (ref 13.0–17.0)
Immature Granulocytes: 1 %
Lymphocytes Relative: 20 %
Lymphs Abs: 1.5 10*3/uL (ref 0.7–4.0)
MCH: 33.5 pg (ref 26.0–34.0)
MCHC: 33.1 g/dL (ref 30.0–36.0)
MCV: 101.2 fL — ABNORMAL HIGH (ref 80.0–100.0)
Monocytes Absolute: 0.8 10*3/uL (ref 0.1–1.0)
Monocytes Relative: 11 %
Neutro Abs: 4.8 10*3/uL (ref 1.7–7.7)
Neutrophils Relative %: 63 %
Platelets: 443 10*3/uL — ABNORMAL HIGH (ref 150–400)
RBC: 2.57 MIL/uL — ABNORMAL LOW (ref 4.22–5.81)
RDW: 19.2 % — ABNORMAL HIGH (ref 11.5–15.5)
WBC: 7.6 10*3/uL (ref 4.0–10.5)
nRBC: 0 % (ref 0.0–0.2)

## 2021-06-18 LAB — LACTIC ACID, PLASMA
Lactic Acid, Venous: 2.3 mmol/L (ref 0.5–1.9)
Lactic Acid, Venous: 2.1 mmol/L (ref 0.5–1.9)

## 2021-06-18 LAB — BASIC METABOLIC PANEL
Anion gap: 10 (ref 5–15)
BUN: <5 mg/dL — ABNORMAL LOW (ref 6–20)
CO2: 21 mmol/L — ABNORMAL LOW (ref 22–32)
Calcium: 8.7 mg/dL — ABNORMAL LOW (ref 8.9–10.3)
Chloride: 100 mmol/L (ref 98–111)
Creatinine, Ser: 0.52 mg/dL — ABNORMAL LOW (ref 0.61–1.24)
GFR, Estimated: >60 mL/min (ref >60)
Glucose, Bld: 91 mg/dL (ref 70–99)
Potassium: 3.3 mmol/L — ABNORMAL LOW (ref 3.5–5.1)
Sodium: 131 mmol/L — ABNORMAL LOW (ref 135–145)

## 2021-06-18 LAB — MAGNESIUM: Magnesium: 1.2 mg/dL — ABNORMAL LOW (ref 1.7–2.4)

## 2021-06-18 LAB — GLUCOSE, CAPILLARY: Glucose-Capillary: 84 mg/dL (ref 70–99)

## 2021-06-18 MED ORDER — MAGNESIUM SULFATE 4 GM/100ML IV SOLN
4.0000 g | Freq: Once | INTRAVENOUS | Status: AC
  Administered 2021-06-18: 4 g via INTRAVENOUS
  Filled 2021-06-18: qty 100

## 2021-06-18 MED ORDER — MAGNESIUM OXIDE -MG SUPPLEMENT 400 (240 MG) MG PO TABS
800.0000 mg | ORAL_TABLET | Freq: Two times a day (BID) | ORAL | Status: DC
  Administered 2021-06-19 – 2021-07-05 (×33): 800 mg via ORAL
  Filled 2021-06-18 (×33): qty 2

## 2021-06-18 MED ORDER — SODIUM CHLORIDE 0.9 % IV SOLN: INTRAVENOUS | Status: DC

## 2021-06-18 MED ORDER — POTASSIUM CHLORIDE CRYS ER 20 MEQ PO TBCR
40.0000 meq | EXTENDED_RELEASE_TABLET | Freq: Once | ORAL | Status: AC
  Administered 2021-06-18: 40 meq via ORAL
  Filled 2021-06-18: qty 2

## 2021-06-18 NOTE — Progress Notes (Signed)
PROGRESS NOTE  Dylan Weaver ZDG:[id number] DOB: 1966-08-25 DOA: 06/04/2021 PCP: [name]  HPI/Recap of past 24 hours: 55 y.o. male severe alcohol abuse who presents to the ED for weakness and jaundice. On admission history was limited due to his encephalopathy and was found to be in acute liver failure related to alcohol abuse. Pt was started on IV fluids, antibiotics, PPI, octreotide (which have all being d/ced except for famotidine). Hospital course complicated by drop in hemoglobin to 5.4, FOBT was positive, required about 2U of PRBC. GI was consulted for further management.  Patient currently stable and awaiting SNF placement.    Today, pt denies any new complaints, no abdominal pain. Noted to be having soft stools daily.    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/metabolic encephalopathy/hepatic encephalopathy ?Hospital delirium Intermittent confusion GI was consulted, EGD on 06/08/2021 showed esophagitis with no signs of bleeding, portal hypertensive gastropathy and gastritis His meld score was 33 on admission MDF on admission was 7.6, was not a candidate for steroids Repeat ammonia level on 06/16/21 WNL Patient received prophylaxis antibiotics for GI bleeding for a total of 7 days, completed Continue midodrine   Possible GI bleed Hemoglobin with mild drop On admission hemoglobin was 5.4, transfused 2 unit of packed red blood cells GI consulted, EGD as above Colonoscopy was done that showed on 06/10/2021 that showed multiple polyps mild diverticulosis nonbleeding internal hemorrhoids Received IV Protonix and octreotide, now transition to p.o. famotidine and octreotide  discontinued Outpatient follow-up with GI Daily cbc  Hypotension Noted some episodes, as low as SBP in the 80s, appears asymptomatic LA pending Started on IV gentle hydration Monitor closely  Acute kidney injury Hepatorenal syndrome Resolved, creatinine back to baseline, on admission 4.4  Alcohol abuse/dependence Advised to quit Continue MVT, thiamine, folic acid S/P CIWA protocol  Hypomagnesemia/hypokalemia Replaced prn It was discussed with renal and they recommended to stop Protonix and start him on a H2 blocker famotidine  Chronic thrombocytopenia Likely 2/2 liver failure Monitor closely  Weakness/deconditioning Physical therapy evaluated the patient and recommended skilled nursing facility.     Malnutrition Type:  Nutrition Problem: Severe Malnutrition Etiology: social / environmental circumstances   Malnutrition Characteristics:  Signs/Symptoms: moderate fat depletion, severe muscle depletion, energy intake < 75% for > or equal to 3 months   Nutrition Interventions:  Interventions: Ensure Enlive (each supplement provides 350kcal and 20 grams of protein), MVI    Estimated body mass index is 21.08 kg/m as calculated from the following:   Height as of this encounter: 6' (1.829 m).   Weight as of this encounter: 70.5 kg.     Code Status: Full  Family Communication: None at bedside  Disposition Plan: Status is: Inpatient  Remains inpatient appropriate because:Inpatient level of care appropriate due to severity of illness  Dispo: The patient is from: Home              Anticipated d/c is to: SNF              Patient currently is medically stable to d/c.   Difficult to place patient No    Consultants: GI  Procedures: EGD Colonoscopy  Antimicrobials: None  DVT prophylaxis: SCD   Objective: Vitals:  06/17/21 2359 06/18/21 0600 06/18/21 0857 06/18/21 1600  BP: 104/81  (!) 84/61 (!) 96/59  Pulse: 70  79 66  Resp: 17  18 18   Temp:  97.9 F (36.6 C)  97.6 F (36.4 C) 98 F (36.7 C)  TempSrc: Oral  Oral Oral  SpO2: 100%  97% 100%  Weight:  70.5 kg    Height:        Intake/Output Summary (Last 24 hours) at 06/18/2021 1740 Last data filed at 06/18/2021 0842 Gross per 24 hour  Intake --  Output 1150 ml  Net -1150 ml   Filed Weights   06/12/21 0407 06/13/21 0330 06/18/21 0600  Weight: 71.5 kg 71.5 kg 70.5 kg    Exam: General: NAD, jaundiced Cardiovascular: S1, S2 present Respiratory: CTAB Abdomen: Soft, nontender, nondistended, bowel sounds present Musculoskeletal: No bilateral pedal edema noted Skin: Jaundiced Psychiatry: Intermittently confused    Data Reviewed: CBC: Recent Labs  Lab 06/16/21 0500 06/17/21 0420 06/18/21 0350  WBC 11.4* 9.0 7.6  NEUTROABS 8.5* 6.1 4.8  HGB 8.4* 8.3* 8.6*  HCT 25.8* 25.6* 26.0*  MCV 100.0 101.6* 101.2*  PLT 439* 472* 443*   Basic Metabolic Panel: Recent Labs  Lab 06/13/21 0530 06/14/21 0631 06/15/21 0948 06/16/21 0500 06/17/21 0420 06/18/21 0350  NA 128* 131*  --  131* 132* 131*  K 3.3* 3.8  --  3.4* 3.6 3.3*  CL 99 100  --  96* 99 100  CO2 22 22  --  22 22 21*  GLUCOSE 99 96  --  84 97 91  BUN 5* 5*  --  <5* 6 <5*  CREATININE 0.56* 0.53*  --  0.48* 0.51* 0.52*  CALCIUM 7.8* 8.5*  --  8.8* 9.1 8.7*  MG 1.1* 1.5* 1.5* 1.2* 1.7 1.2*   GFR: Estimated Creatinine Clearance: 104 mL/min (A) (by C-G formula based on SCr of 0.52 mg/dL (L)). Liver Function Tests: No results for input(s): AST, ALT, ALKPHOS, BILITOT, PROT, ALBUMIN in the last 168 hours.  No results for input(s): LIPASE, AMYLASE in the last 168 hours. Recent Labs  Lab 06/16/21 1004  AMMONIA 29   Coagulation Profile: No results for input(s): INR, PROTIME in the last 168 hours.  Cardiac Enzymes: No results for input(s): CKTOTAL, CKMB, CKMBINDEX, TROPONINI in the last 168 hours. BNP (last 3 results) No results for input(s): PROBNP in the last 8760 hours. HbA1C: No results for  input(s): HGBA1C in the last 72 hours. CBG: Recent Labs  Lab 06/17/21 2129 06/18/21 0613  GLUCAP 119* 84   Lipid Profile: No results for input(s): CHOL, HDL, LDLCALC, TRIG, CHOLHDL, LDLDIRECT in the last 72 hours. Thyroid Function Tests: No results for input(s): TSH, T4TOTAL, FREET4, T3FREE, THYROIDAB in the last 72 hours. Anemia Panel: No results for input(s): VITAMINB12, FOLATE, FERRITIN, TIBC, IRON, RETICCTPCT in the last 72 hours. Urine analysis:    Component Value Date/Time   COLORURINE YELLOW 01/24/2021 1601   APPEARANCEUR CLEAR 01/24/2021 1601   LABSPEC 1.009 01/24/2021 1601   PHURINE 6.0 01/24/2021 1601   GLUCOSEU NEGATIVE 01/24/2021 1601   HGBUR NEGATIVE 01/24/2021 1601   BILIRUBINUR NEGATIVE 01/24/2021 1601   KETONESUR NEGATIVE 01/24/2021 1601   PROTEINUR NEGATIVE 01/24/2021 1601   NITRITE NEGATIVE 01/24/2021 1601   LEUKOCYTESUR NEGATIVE 01/24/2021 1601   Sepsis Labs: @LABRCNTIP (procalcitonin:4,lacticidven:4)  )No results found for this or any previous visit (from the past 240 hour(s)).    Studies: No results found.  Scheduled Meds:  famotidine  40 mg Oral BID  feeding supplement  237 mL Oral TID BM   folic acid  1 mg Oral Daily   gabapentin  100 mg Oral TID   [START ON 06/19/2021] magnesium oxide  800 mg Oral BID   midodrine  2.5 mg Oral TID WC   multivitamin with minerals  1 tablet Oral Daily   sodium chloride flush  10-40 mL Intracatheter Q12H   sodium chloride flush  3 mL Intravenous Q12H   thiamine  100 mg Oral Daily    Continuous Infusions:   LOS: 14 days     Briant Cedar, MD [facility]  If 7PM-7AM, please contact night-coverage [email or web address] 06/18/2021, 5:40 PM

## 2021-06-19 DIAGNOSIS — K7201 Acute and subacute hepatic failure with coma: Secondary | ICD-10-CM | POA: Diagnosis not present

## 2021-06-19 DIAGNOSIS — F1020 Alcohol dependence, uncomplicated: Secondary | ICD-10-CM | POA: Diagnosis not present

## 2021-06-19 DIAGNOSIS — E43 Unspecified severe protein-calorie malnutrition: Secondary | ICD-10-CM | POA: Diagnosis not present

## 2021-06-19 DIAGNOSIS — N179 Acute kidney failure, unspecified: Secondary | ICD-10-CM | POA: Diagnosis not present

## 2021-06-19 LAB — OSMOLALITY: Osmolality: 271 mOsm/kg — ABNORMAL LOW (ref 275–295)

## 2021-06-19 LAB — BASIC METABOLIC PANEL
Anion gap: 9 (ref 5–15)
BUN: 6 mg/dL (ref 6–20)
CO2: 20 mmol/L — ABNORMAL LOW (ref 22–32)
Calcium: 8.4 mg/dL — ABNORMAL LOW (ref 8.9–10.3)
Chloride: 99 mmol/L (ref 98–111)
Creatinine, Ser: 0.45 mg/dL — ABNORMAL LOW (ref 0.61–1.24)
GFR, Estimated: >60 mL/min (ref >60)
Glucose, Bld: 89 mg/dL (ref 70–99)
Potassium: 5.1 mmol/L (ref 3.5–5.1)
Sodium: 128 mmol/L — ABNORMAL LOW (ref 135–145)

## 2021-06-19 LAB — CBC WITH DIFFERENTIAL/PLATELET
Abs Immature Granulocytes: 0.08 10*3/uL — ABNORMAL HIGH (ref 0.00–0.07)
Basophils Absolute: 0.1 10*3/uL (ref 0.0–0.1)
Basophils Relative: 1 %
Eosinophils Absolute: 0.3 10*3/uL (ref 0.0–0.5)
Eosinophils Relative: 4 %
HCT: 30.3 % — ABNORMAL LOW (ref 39.0–52.0)
Hemoglobin: 9.4 g/dL — ABNORMAL LOW (ref 13.0–17.0)
Immature Granulocytes: 1 %
Lymphocytes Relative: 24 %
Lymphs Abs: 1.7 10*3/uL (ref 0.7–4.0)
MCH: 32 pg (ref 26.0–34.0)
MCHC: 31 g/dL (ref 30.0–36.0)
MCV: 103.1 fL — ABNORMAL HIGH (ref 80.0–100.0)
Monocytes Absolute: 0.8 10*3/uL (ref 0.1–1.0)
Monocytes Relative: 11 %
Neutro Abs: 4.4 10*3/uL (ref 1.7–7.7)
Neutrophils Relative %: 59 %
Platelets: 371 10*3/uL (ref 150–400)
RBC: 2.94 MIL/uL — ABNORMAL LOW (ref 4.22–5.81)
RDW: 19.1 % — ABNORMAL HIGH (ref 11.5–15.5)
WBC: 7.3 10*3/uL (ref 4.0–10.5)
nRBC: 0 % (ref 0.0–0.2)

## 2021-06-19 LAB — MAGNESIUM: Magnesium: 1.7 mg/dL (ref 1.7–2.4)

## 2021-06-19 LAB — SODIUM, URINE, RANDOM: Sodium, Ur: 128 mmol/L

## 2021-06-19 LAB — OSMOLALITY, URINE: Osmolality, Ur: 637 mOsm/kg (ref 300–900)

## 2021-06-19 NOTE — Progress Notes (Signed)
PROGRESS NOTE  Dylan Weaver EQA:[id number] DOB: 1965-10-21 DOA: 06/04/2021 P[facility]: [name]  HPI/Recap of past 24 hours: 55 y.o. male severe alcohol abuse who presents to the ED for weakness and jaundice. On admission history was limited due to his encephalopathy and was found to be in acute liver failure related to alcohol abuse. Pt was started on IV fluids, antibiotics, PPI, octreotide (which have all being d/ced except for famotidine). Hospital course complicated by drop in hemoglobin to 5.4, FOBT was positive, required about 2U of PRBC. GI was consulted for further management.  Patient currently stable and awaiting SNF placement.    Today, pt denies any new complaints. Eating and drinking well. 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/metabolic encephalopathy/hepatic encephalopathy ?Hospital delirium Intermittent confusion GI was consulted, EGD on 06/08/2021 showed esophagitis with no signs of bleeding, portal hypertensive gastropathy and gastritis His meld score was 33 on admission MDF on admission was 7.6, was not a candidate for steroids Repeat ammonia level on 06/16/21 WNL Patient received prophylaxis antibiotics for GI bleeding for a total of 7 days, completed Continue midodrine   Possible GI bleed Hemoglobin with mild drop On admission hemoglobin was 5.4, transfused 2 unit of packed red blood cells GI consulted, EGD as above Colonoscopy was done that showed on 06/10/2021 that showed multiple polyps mild diverticulosis nonbleeding internal hemorrhoids Received IV Protonix and octreotide, now transition to p.o. 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 Vs adrenal insufficiency Vs hypothyroidism Osmolality 271, urine osmolality 637, urine sodium 128 TSH, free T4, cortisol, acth all pending Fluid restriction for now Daily BMP  Hypotension Improving Noted some episodes, as low as SBP in the 80s, appears asymptomatic LA mildly elevated, no need to trend Stop IVF Monitor closely  Acute kidney injury Hepatorenal syndrome Resolved, creatinine back to baseline, on admission 4.4  Alcohol abuse/dependence Advised to quit Continue MVT, thiamine, folic acid S/P CIWA protocol  Hypomagnesemia/hypokalemia Replaced prn It was discussed with renal and they recommended to stop Protonix and start him on a H2 blocker famotidine  Chronic thrombocytopenia Likely 2/2 liver failure Monitor closely  Weakness/deconditioning Physical therapy evaluated the patient and recommended skilled nursing facility.     Malnutrition Type:  Nutrition Problem: Severe Malnutrition Etiology: social / environmental circumstances   Malnutrition Characteristics:  Signs/Symptoms: moderate fat depletion, severe muscle depletion, energy intake < 75% for > or equal to 3 months   Nutrition Interventions:  Interventions: Ensure Enlive (each supplement provides 350kcal and 20 grams of protein), MVI    Estimated body mass index is 21.11 kg/m as calculated from the following:   Height as of this encounter: 6' (1.829 m).   Weight as of this encounter: 70.6 kg.     Code Status: Full  Family Communication: None at bedside  Disposition Plan: Status is: Inpatient  Remains inpatient appropriate because:Inpatient level of care appropriate due to severity of illness  Dispo: The patient is from: Home              Anticipated d/c is to: SNF              Patient currently is  medically stable to d/c.   Difficult to place patient  No    Consultants: GI  Procedures: EGD Colonoscopy  Antimicrobials: None  DVT prophylaxis: SCD   Objective: Vitals:   06/19/21 0500 06/19/21 0831 06/19/21 1256 06/19/21 1439  BP:  92/75 97/72 93/72   Pulse:  71 74 76  Resp: 14 14 16 16   Temp:  97.7 F (36.5 C) 97.8 F (36.6 C) 98 F (36.7 C)  TempSrc:  Oral Oral Oral  SpO2: 100% 100% 100% 98%  Weight: 70.6 kg     Height:        Intake/Output Summary (Last 24 hours) at 06/19/2021 1525 Last data filed at 06/19/2021 0835 Gross per 24 hour  Intake 480 ml  Output 1575 ml  Net -1095 ml   Filed Weights   06/13/21 0330 06/18/21 0600 06/19/21 0500  Weight: 71.5 kg 70.5 kg 70.6 kg    Exam: General: NAD, jaundiced Cardiovascular: S1, S2 present Respiratory: CTAB Abdomen: Soft, nontender, nondistended, bowel sounds present Musculoskeletal: No bilateral pedal edema noted Skin: Jaundiced Psychiatry: Intermittently confused    Data Reviewed: CBC: Recent Labs  Lab 06/16/21 0500 06/17/21 0420 06/18/21 0350 06/19/21 0140  WBC 11.4* 9.0 7.6 7.3  NEUTROABS 8.5* 6.1 4.8 4.4  HGB 8.4* 8.3* 8.6* 9.4*  HCT 25.8* 25.6* 26.0* 30.3*  MCV 100.0 101.6* 101.2* 103.1*  PLT 439* 472* 443* 371   Basic Metabolic Panel: Recent Labs  Lab 06/14/21 0631 06/15/21 0948 06/16/21 0500 06/17/21 0420 06/18/21 0350 06/19/21 0140  NA 131*  --  131* 132* 131* 128*  K 3.8  --  3.4* 3.6 3.3* 5.1  CL 100  --  96* 99 100 99  CO2 22  --  22 22 21* 20*  GLUCOSE 96  --  84 97 91 89  BUN 5*  --  <5* 6 <5* 6  CREATININE 0.53*  --  0.48* 0.51* 0.52* 0.45*  CALCIUM 8.5*  --  8.8* 9.1 8.7* 8.4*  MG 1.5* 1.5* 1.2* 1.7 1.2* 1.7   GFR: Estimated Creatinine Clearance: 104.2 mL/min (A) (by C-G formula based on SCr of 0.45 mg/dL (L)). Liver Function Tests: No results for input(s): AST, ALT, ALKPHOS, BILITOT, PROT, ALBUMIN in the last 168 hours.  No results for input(s): LIPASE, AMYLASE in the last 168 hours. Recent Labs  Lab  06/16/21 1004  AMMONIA 29   Coagulation Profile: No results for input(s): INR, PROTIME in the last 168 hours.  Cardiac Enzymes: No results for input(s): CKTOTAL, CKMB, CKMBINDEX, TROPONINI in the last 168 hours. BNP (last 3 results) No results for input(s): PROBNP in the last 8760 hours. HbA1C: No results for input(s): HGBA1C in the last 72 hours. CBG: Recent Labs  Lab 06/17/21 2129 06/18/21 0613  GLUCAP 119* 84   Lipid Profile: No results for input(s): CHOL, HDL, LDLCALC, TRIG, CHOLHDL, LDLDIRECT in the last 72 hours. Thyroid Function Tests: No results for input(s): TSH, T4TOTAL, FREET4, T3FREE, THYROIDAB in the last 72 hours. Anemia Panel: No results for input(s): VITAMINB12, FOLATE, FERRITIN, TIBC, IRON, RETICCTPCT in the last 72 hours. Urine analysis:    Component Value Date/Time   COLORURINE YELLOW 01/24/2021 1601   APPEARANCEUR CLEAR 01/24/2021 1601   LABSPEC 1.009 01/24/2021 1601   PHURINE 6.0 01/24/2021 1601   GLUCOSEU NEGATIVE 01/24/2021 1601   HGBUR NEGATIVE 01/24/2021 1601   BILIRUBINUR NEGATIVE 01/24/2021 1601   KETONESUR NEGATIVE 01/24/2021 1601   PROTEINUR NEGATIVE 01/24/2021 1601   NITRITE NEGATIVE 01/24/2021 1601   LEUKOCYTESUR NEGATIVE  01/24/2021 1601   Sepsis Labs: @LABRCNTIP (procalcitonin:4,lacticidven:4)  )No results found for this or any previous visit (from the past 240 hour(s)).    Studies: No results found.  Scheduled Meds:  famotidine  40 mg Oral BID   feeding supplement  237 mL Oral TID BM   folic acid  1 mg Oral Daily   gabapentin  100 mg Oral TID   magnesium oxide  800 mg Oral BID   midodrine  2.5 mg Oral TID WC   multivitamin with minerals  1 tablet Oral Daily   sodium chloride flush  10-40 mL Intracatheter Q12H   sodium chloride flush  3 mL Intravenous Q12H   thiamine  100 mg Oral Daily    Continuous Infusions:   LOS: 15 days     , MD [facility]  If 7PM-7AM, please contact  night-coverage [email or web address] 06/19/2021, 3:25 PM

## 2021-06-20 DIAGNOSIS — N179 Acute kidney failure, unspecified: Secondary | ICD-10-CM | POA: Diagnosis not present

## 2021-06-20 DIAGNOSIS — F1020 Alcohol dependence, uncomplicated: Secondary | ICD-10-CM | POA: Diagnosis not present

## 2021-06-20 DIAGNOSIS — E43 Unspecified severe protein-calorie malnutrition: Secondary | ICD-10-CM | POA: Diagnosis not present

## 2021-06-20 DIAGNOSIS — K7201 Acute and subacute hepatic failure with coma: Secondary | ICD-10-CM | POA: Diagnosis not present

## 2021-06-20 LAB — CBC WITH DIFFERENTIAL/PLATELET
Abs Immature Granulocytes: 0.12 10*3/uL — ABNORMAL HIGH (ref 0.00–0.07)
Basophils Absolute: 0.1 10*3/uL (ref 0.0–0.1)
Basophils Relative: 1 %
Eosinophils Absolute: 0.2 10*3/uL (ref 0.0–0.5)
Eosinophils Relative: 3 %
HCT: 27.7 % — ABNORMAL LOW (ref 39.0–52.0)
Hemoglobin: 9.2 g/dL — ABNORMAL LOW (ref 13.0–17.0)
Immature Granulocytes: 2 %
Lymphocytes Relative: 26 %
Lymphs Abs: 2 10*3/uL (ref 0.7–4.0)
MCH: 33.7 pg (ref 26.0–34.0)
MCHC: 33.2 g/dL (ref 30.0–36.0)
MCV: 101.5 fL — ABNORMAL HIGH (ref 80.0–100.0)
Monocytes Absolute: 0.7 10*3/uL (ref 0.1–1.0)
Monocytes Relative: 10 %
Neutro Abs: 4.6 10*3/uL (ref 1.7–7.7)
Neutrophils Relative %: 58 %
Platelets: 422 10*3/uL — ABNORMAL HIGH (ref 150–400)
RBC: 2.73 MIL/uL — ABNORMAL LOW (ref 4.22–5.81)
RDW: 18.3 % — ABNORMAL HIGH (ref 11.5–15.5)
WBC: 7.7 10*3/uL (ref 4.0–10.5)
nRBC: 0 % (ref 0.0–0.2)

## 2021-06-20 LAB — BASIC METABOLIC PANEL
Anion gap: 8 (ref 5–15)
BUN: 9 mg/dL (ref 6–20)
CO2: 19 mmol/L — ABNORMAL LOW (ref 22–32)
Calcium: 8.6 mg/dL — ABNORMAL LOW (ref 8.9–10.3)
Chloride: 102 mmol/L (ref 98–111)
Creatinine, Ser: 0.53 mg/dL — ABNORMAL LOW (ref 0.61–1.24)
GFR, Estimated: >60 mL/min (ref >60)
Glucose, Bld: 91 mg/dL (ref 70–99)
Potassium: 3.5 mmol/L (ref 3.5–5.1)
Sodium: 129 mmol/L — ABNORMAL LOW (ref 135–145)

## 2021-06-20 LAB — MAGNESIUM: Magnesium: 1.3 mg/dL — ABNORMAL LOW (ref 1.7–2.4)

## 2021-06-20 LAB — T4, FREE: Free T4: 1.17 ng/dL — ABNORMAL HIGH (ref 0.61–1.12)

## 2021-06-20 LAB — CORTISOL-AM, BLOOD: Cortisol - AM: 10.9 ug/dL (ref 6.7–22.6)

## 2021-06-20 LAB — TSH: TSH: 4.994 u[IU]/mL — ABNORMAL HIGH (ref 0.350–4.500)

## 2021-06-20 MED ORDER — MAGNESIUM SULFATE 4 GM/100ML IV SOLN
4.0000 g | Freq: Once | INTRAVENOUS | Status: AC
  Administered 2021-06-20: 4 g via INTRAVENOUS
  Filled 2021-06-20: qty 100

## 2021-06-20 MED ORDER — PROSOURCE PLUS PO LIQD
30.0000 mL | Freq: Two times a day (BID) | ORAL | Status: DC
  Administered 2021-06-20 – 2021-07-05 (×29): 30 mL via ORAL
  Filled 2021-06-20 (×29): qty 30

## 2021-06-20 NOTE — Progress Notes (Signed)
Nutrition Follow-up  DOCUMENTATION CODES:   Severe malnutrition in context of social or environmental circumstances  INTERVENTION:  - Continue MVI w/ minerals  - Discontinue Ensure Enlive  - Provide double protein on for all meals.  -ProSource Plus BID, provides 100 kcal and 15 grams of protein each   NUTRITION DIAGNOSIS:   Severe Malnutrition related to social / environmental circumstances as evidenced by moderate fat depletion, severe muscle depletion, energy intake < 75% for > or equal to 3 months. - Ongoing   GOAL:   Patient will meet greater than or equal to 90% of their needs - Progressing   MONITOR:   PO intake, Supplement acceptance, Weight trends, Labs, I & O's, Skin  REASON FOR ASSESSMENT:   Consult Assessment of nutrition requirement/status, Poor PO  ASSESSMENT:   55 yo male with a PMH of EtOH abuse who presents with acute liver failure with hepatic coma (encephalopathy). Came in with weakness and jaundice.  When asking pt questions regarding his intake and ONS consumption, pt became aroused and yelling. PLDN unable to obtain any information from pt at this time.   Discussed pt with nurse, nurse reports that the pt is eating 100% of his meals, but is not drinking the Ensures. Reports that one time he likes them and the next he does not. Nurse also reported that the pt is having diarrhea and asked to stay away from dairy products as a supplement.   Will discontinue Ensure; add double protein and ProSource Plus.   Medications reviewed and include: Folic acid, MVI w/ minerals, Magnesium Oxide, Thiamine Labs reviewed: Magnesium 1.3, Sodium 129   Diet Order:   Diet Order             Diet regular Room service appropriate? Yes with Assist; Fluid consistency: Thin; Fluid restriction: 1200 mL Fluid  Diet effective now           Diet - low sodium heart healthy                   EDUCATION NEEDS:   Education needs have been addressed  Skin:  Skin  Assessment:  Skin Integrity Issues: Other: Several skin tears, ulcer on back, and wound on groin. Skin jaundice in color  Last BM:  9/18 - Type 6  Height:   Ht Readings from Last 1 Encounters:  06/09/21 6' (1.829 m)    Weight:   Wt Readings from Last 1 Encounters:  06/20/21 70.8 kg    Ideal Body Weight:  80.9 kg  BMI:  Body mass index is 21.17 kg/m.  Estimated Nutritional Needs:   Kcal:  2200-2400  Protein:  100-115 grams  Fluid:  >2.2 L    Kirby Crigler BS, PLDN Clinical Dietitian See AMiON for contact information.

## 2021-06-20 NOTE — Progress Notes (Signed)
Physical Therapy Treatment Patient Details Name: Dylan Weaver MRN: [id number] DOB: December 08, 1965 Today's Date: 06/20/2021   History of Present Illness Pt is a 55 y.o. male admitted 06/04/21 with weakness, jaundice. Found to have ARF with hepatic encephalopathy, possible GIB. CXR with possible L atelectasis. S/p colonoscopy 9/9 which showed multiple polyps, mild diverticulosis. PMH includes ETOH abuse.    PT Comments    Pt with better progress today. Agreeable to therapy and able to initiate gait training for a short distance. If pt continues to participate expect progress will be good.    Recommendations for follow up therapy are one component of a multi-disciplinary discharge planning process, led by the attending physician.  Recommendations may be updated based on patient status, additional functional criteria and insurance authorization.  Follow Up Recommendations  SNF;Supervision/Assistance - 24 hour     Equipment Recommendations  Rolling walker with 5" wheels;3in1 (PT);Wheelchair cushion (measurements PT);Wheelchair (measurements PT)    Recommendations for Other Services       Precautions / Restrictions Precautions Precautions: Fall     Mobility  Bed Mobility Overal bed mobility: Modified Independent Bed Mobility: Supine to Sit     Supine to sit: Modified independent (Device/Increase time);HOB elevated          Transfers Overall transfer level: Needs assistance Equipment used: Rolling walker (2 wheeled);1 person hand held assist Transfers: Sit to/from [employer] Sit to Stand: Min assist Stand pivot transfers: Min assist       General transfer comment: Assist to bring hips up and for balance. Stood from bed and pivoted to recliner with hand held assist. Stood from chair with walker prior to amb  Ambulation/Gait Ambulation/Gait assistance: Editor, commissioning (Feet): 3 Feet (forward and back) Assistive device: Rolling walker (2  wheeled) Gait Pattern/deviations: Step-through pattern;Decreased step length - right;Decreased step length - left;Shuffle;Trunk flexed Gait velocity: decr Gait velocity interpretation: <1.31 ft/sec, indicative of household ambulator General Gait Details: Assist for balance and support. Knees flexed   Stairs             Wheelchair Mobility    Modified Rankin (Stroke Patients Only)       Balance Overall balance assessment: Needs assistance Sitting-balance support: No upper extremity supported;Feet supported Sitting balance-Leahy Scale: Good     Standing balance support: Bilateral upper extremity supported;During functional activity Standing balance-Leahy Scale: Poor Standing balance comment: walker and min assist for static standing                            Cognition Arousal/Alertness: Awake/alert Behavior During Therapy: WFL for tasks assessed/performed Overall Cognitive Status: No family/caregiver present to determine baseline cognitive functioning                   Orientation Level: Disoriented to;Time;Situation Current Attention Level: Selective Memory: Decreased short-term memory Following Commands: Follows one step commands consistently Safety/Judgement: Decreased awareness of safety;Decreased awareness of deficits Awareness: Intellectual Problem Solving: Requires verbal cues;Requires tactile cues        Exercises      General Comments        Pertinent Vitals/Pain Pain Assessment: No/denies pain    Home Living                      Prior Function            PT Goals (current goals can now be found in the care plan section) Progress  towards PT goals: Progressing toward goals    Frequency    Min 2X/week      PT Plan Current plan remains appropriate    Co-evaluation              AM-PAC PT "6 Clicks" Mobility   Outcome Measure  Help needed turning from your back to your side while in a flat bed  without using bedrails?: None Help needed moving from lying on your back to sitting on the side of a flat bed without using bedrails?: None Help needed moving to and from a bed to a chair (including a wheelchair)?: A Little Help needed standing up from a chair using your arms (e.g., wheelchair or bedside chair)?: A Little Help needed to walk in hospital room?: A Lot Help needed climbing 3-5 steps with a railing? : Total 6 Click Score: 17    End of Session Equipment Utilized During Treatment: Gait belt Activity Tolerance: Patient tolerated treatment well Patient left: in chair;with call bell/phone within reach;with chair alarm set Nurse Communication: Mobility status PT Visit Diagnosis: Muscle weakness (generalized) (M62.81);Unsteadiness on feet (R26.81);Difficulty in walking, not elsewhere classified (R26.2)     Time: 1105-1130 PT Time Calculation (min) (ACUTE ONLY): 25 min  Charges:  $Therapeutic Activity: 23-37 mins                     [facility] PT Acute Rehabilitation Services Pager [phone] Office [phone]    Angelina Ok [facility] 06/20/2021, 1:19 PM

## 2021-06-20 NOTE — Progress Notes (Signed)
PROGRESS NOTE  Dylan Weaver UKG:[id number] DOB: 1966/08/15 DOA: 06/04/2021 PCP: [name]  HPI/Recap of past 24 hours: 55 y.o. male severe alcohol abuse who presents to the ED for weakness and jaundice. On admission history was limited due to his encephalopathy and was found to be in acute liver failure related to alcohol abuse. Pt was started on IV fluids, antibiotics, PPI, octreotide (which have all being d/ced except for famotidine). Hospital course complicated by drop in hemoglobin to 5.4, FOBT was positive, required about 2U of PRBC. GI was consulted for further management.  Patient currently stable and awaiting SNF placement.    Pt denies any new complaints.    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/metabolic encephalopathy/hepatic encephalopathy ?Hospital delirium Intermittent confusion GI was consulted, EGD on 06/08/2021 showed esophagitis with no signs of bleeding, portal hypertensive gastropathy and gastritis His meld score was 33 on admission MDF on admission was 7.6, was not a candidate for steroids Repeat ammonia level on 06/16/21 WNL Patient received prophylaxis antibiotics for GI bleeding for a total of 7 days, completed Continue midodrine   Possible GI bleed Hemoglobin with mild drop, but has been stable On admission hemoglobin was 5.4, transfused 2 unit of packed red blood cells GI consulted, EGD as above Colonoscopy was done that showed on 06/10/2021 that showed multiple polyps mild diverticulosis nonbleeding internal hemorrhoids Received IV Protonix and octreotide, now transition to p.o. 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 Vs adrenal insufficiency Vs hypothyroidism Osmolality 271, urine osmolality 637, urine sodium 128 TSH-->4.99, free T4 1.17, T3 pending Cortisol WNL, ACTH pending Fluid restriction for now Daily BMP  Hypotension Stable- baseline soft Noted some episodes, as low as SBP in the 80s, appears asymptomatic LA mildly elevated, no need to trend Stop IVF due to above Monitor closely  Acute kidney injury Hepatorenal syndrome Resolved, creatinine back to baseline, on admission 4.4  Alcohol abuse/dependence Advised to quit Continue MVT, thiamine, folic acid S/P CIWA protocol  Hypomagnesemia/hypokalemia Replaced prn It was discussed with renal and they recommended to stop Protonix and start him on a H2 blocker famotidine  Chronic thrombocytopenia Likely 2/2 liver failure Monitor closely  Weakness/deconditioning Physical therapy evaluated the patient and recommended skilled nursing facility.     Malnutrition Type:  Nutrition Problem: Severe Malnutrition Etiology: social / environmental circumstances   Malnutrition Characteristics:  Signs/Symptoms: moderate fat depletion, severe muscle depletion, energy intake < 75% for > or equal to 3 months   Nutrition Interventions:  Interventions: Ensure Enlive (each supplement provides 350kcal and 20 grams of protein), MVI    Estimated body mass index is 21.17 kg/m as calculated from the following:   Height as of this encounter: 6' (1.829 m).   Weight as of this encounter: 70.8 kg.     Code Status: Full  Family Communication: None at bedside  Disposition Plan: Status is: Inpatient  Remains inpatient appropriate because:Inpatient level of care appropriate due to severity of illness  Dispo: The patient is from: Home              Anticipated d/c is to: SNF  Patient currently is medically stable to d/c.   Difficult to place patient  No    Consultants: GI  Procedures: EGD Colonoscopy  Antimicrobials: None  DVT prophylaxis: SCD   Objective: Vitals:   06/19/21 1439 06/20/21 0321 06/20/21 0440 06/20/21 0813  BP: 93/72 94/73  98/68  Pulse: 76 75  71  Resp: 16 18  16   Temp: 98 F (36.7 C) 97.6 F (36.4 C)  97.7 F (36.5 C)  TempSrc: Oral Oral  Oral  SpO2: 98% 100%  98%  Weight:   70.8 kg   Height:        Intake/Output Summary (Last 24 hours) at 06/20/2021 1324 Last data filed at 06/20/2021 0813 Gross per 24 hour  Intake 250 ml  Output 800 ml  Net -550 ml   Filed Weights   06/18/21 0600 06/19/21 0500 06/20/21 0440  Weight: 70.5 kg 70.6 kg 70.8 kg    Exam: General: NAD, jaundiced Cardiovascular: S1, S2 present Respiratory: CTAB Abdomen: Soft, nontender, nondistended, bowel sounds present Musculoskeletal: No bilateral pedal edema noted Skin: Jaundiced Psychiatry: Intermittently confused    Data Reviewed: CBC: Recent Labs  Lab 06/16/21 0500 06/17/21 0420 06/18/21 0350 06/19/21 0140 06/20/21 0315  WBC 11.4* 9.0 7.6 7.3 7.7  NEUTROABS 8.5* 6.1 4.8 4.4 4.6  HGB 8.4* 8.3* 8.6* 9.4* 9.2*  HCT 25.8* 25.6* 26.0* 30.3* 27.7*  MCV 100.0 101.6* 101.2* 103.1* 101.5*  PLT 439* 472* 443* 371 422*   Basic Metabolic Panel: Recent Labs  Lab 06/16/21 0500 06/17/21 0420 06/18/21 0350 06/19/21 0140 06/20/21 0315  NA 131* 132* 131* 128* 129*  K 3.4* 3.6 3.3* 5.1 3.5  CL 96* 99 100 99 102  CO2 22 22 21* 20* 19*  GLUCOSE 84 97 91 89 91  BUN <5* 6 <5* 6 9  CREATININE 0.48* 0.51* 0.52* 0.45* 0.53*  CALCIUM 8.8* 9.1 8.7* 8.4* 8.6*  MG 1.2* 1.7 1.2* 1.7 1.3*   GFR: Estimated Creatinine Clearance: 104.5 mL/min (A) (by C-G formula based on SCr of 0.53 mg/dL (L)). Liver Function Tests: No results for input(s): AST, ALT, ALKPHOS, BILITOT, PROT, ALBUMIN in the last 168 hours.  No results for input(s): LIPASE, AMYLASE in the last 168 hours. Recent Labs  Lab 06/16/21 1004  AMMONIA 29    Coagulation Profile: No results for input(s): INR, PROTIME in the last 168 hours.  Cardiac Enzymes: No results for input(s): CKTOTAL, CKMB, CKMBINDEX, TROPONINI in the last 168 hours. BNP (last 3 results) No results for input(s): PROBNP in the last 8760 hours. HbA1C: No results for input(s): HGBA1C in the last 72 hours. CBG: Recent Labs  Lab 06/17/21 2129 06/18/21 0613  GLUCAP 119* 84   Lipid Profile: No results for input(s): CHOL, HDL, LDLCALC, TRIG, CHOLHDL, LDLDIRECT in the last 72 hours. Thyroid Function Tests: Recent Labs    06/20/21 0315  TSH 4.994*  FREET4 1.17*   Anemia Panel: No results for input(s): VITAMINB12, FOLATE, FERRITIN, TIBC, IRON, RETICCTPCT in the last 72 hours. Urine analysis:    Component Value Date/Time   COLORURINE YELLOW 01/24/2021 1601   APPEARANCEUR CLEAR 01/24/2021 1601   LABSPEC 1.009 01/24/2021 1601   PHURINE 6.0 01/24/2021 1601   GLUCOSEU NEGATIVE 01/24/2021 1601   HGBUR NEGATIVE 01/24/2021 1601   BILIRUBINUR NEGATIVE 01/24/2021 1601   KETONESUR NEGATIVE 01/24/2021 1601   PROTEINUR NEGATIVE 01/24/2021 1601   NITRITE NEGATIVE 01/24/2021 1601   LEUKOCYTESUR NEGATIVE 01/24/2021 1601   Sepsis Labs: @LABRCNTIP (procalcitonin:4,lacticidven:4)  )No results found for this or any  previous visit (from the past 240 hour(s)).    Studies: No results found.  Scheduled Meds:  famotidine  40 mg Oral BID   feeding supplement  237 mL Oral TID BM   folic acid  1 mg Oral Daily   gabapentin  100 mg Oral TID   magnesium oxide  800 mg Oral BID   midodrine  2.5 mg Oral TID WC   multivitamin with minerals  1 tablet Oral Daily   sodium chloride flush  10-40 mL Intracatheter Q12H   sodium chloride flush  3 mL Intravenous Q12H   thiamine  100 mg Oral Daily    Continuous Infusions:   LOS: 16 days     Briant Cedar, MD [facility]  If 7PM-7AM, please contact night-coverage [email or web address] 06/20/2021, 1:24 PM

## 2021-06-20 NOTE — TOC Progression Note (Signed)
Transition of Care [facility] - Progression Note    Patient Details  Name: Alpheus Stiff MRN: [id number] Date of Birth: 1966-09-21  Transition of Care [facility]) CM/SW Contact  Terrial Rhodes, LCSWA Phone Number: 06/20/2021, 4:35 PM  Clinical Narrative:     CSW checked in with [facility] to see if patient has been reviewed. Okey Regal with [facility] said she does not have patient on list. Okey Regal CSW will follow up with Aram Beecham social worker tomorrow on patient to see how she can assist.   Expected Discharge Plan: Skilled Nursing Facility Barriers to Discharge: Inadequate or no insurance, Continued Medical Work up, SNF Pending bed offer  Expected Discharge Plan and Services Expected Discharge Plan: Skilled Nursing Facility In-house Referral: Clinical Social Work       Expected Discharge Date: 06/14/21                                     Social Determinants of Health (SDOH) Interventions    Readmission Risk Interventions No flowsheet data found.

## 2021-06-21 DIAGNOSIS — K7201 Acute and subacute hepatic failure with coma: Secondary | ICD-10-CM | POA: Diagnosis not present

## 2021-06-21 DIAGNOSIS — N179 Acute kidney failure, unspecified: Secondary | ICD-10-CM | POA: Diagnosis not present

## 2021-06-21 DIAGNOSIS — E43 Unspecified severe protein-calorie malnutrition: Secondary | ICD-10-CM | POA: Diagnosis not present

## 2021-06-21 DIAGNOSIS — F1020 Alcohol dependence, uncomplicated: Secondary | ICD-10-CM | POA: Diagnosis not present

## 2021-06-21 LAB — BASIC METABOLIC PANEL
Anion gap: 10 (ref 5–15)
BUN: 7 mg/dL (ref 6–20)
CO2: 20 mmol/L — ABNORMAL LOW (ref 22–32)
Calcium: 9 mg/dL (ref 8.9–10.3)
Chloride: 98 mmol/L (ref 98–111)
Creatinine, Ser: 0.46 mg/dL — ABNORMAL LOW (ref 0.61–1.24)
GFR, Estimated: >60 mL/min (ref >60)
Glucose, Bld: 83 mg/dL (ref 70–99)
Potassium: 3.7 mmol/L (ref 3.5–5.1)
Sodium: 128 mmol/L — ABNORMAL LOW (ref 135–145)

## 2021-06-21 LAB — T3: T3, Total: 119 ng/dL (ref 71–180)

## 2021-06-21 LAB — MAGNESIUM: Magnesium: 1.7 mg/dL (ref 1.7–2.4)

## 2021-06-21 LAB — ACTH: C206 ACTH: 6.1 pg/mL — ABNORMAL LOW (ref 7.2–63.3)

## 2021-06-21 NOTE — TOC Progression Note (Addendum)
Transition of Care [facility]) - Progression Note    Patient Details  Name: Dylan Weaver MRN: [id number] Date of Birth: 1966/08/05  Transition of [facility]) CM/SW Contact  Eduard Roux, [place] Phone Number: 06/21/2021, 2:34 PM  Clinical Narrative:     Patient has no bed offers-  CSW spoke with [facility]-unable to offer- SNF [facility] contact has not been renewed.  [facility]- spoke with Marylene Land- CSW  faxed referral for them to review   CSW will continue to follow and assist with discharge planning.  Antony Blackbird, MSW, LCSW Clinical Social Worker    Expected Discharge Plan: Skilled Nursing Facility Barriers to Discharge: Inadequate or no insurance, Continued Medical Work up, SNF Pending bed offer  Expected Discharge Plan and Services Expected Discharge Plan: Skilled Nursing Facility In-house Referral: Clinical Social Work       Expected Discharge Date: 06/14/21                                     Social Determinants of Health (SDOH) Interventions    Readmission Risk Interventions No flowsheet data found.

## 2021-06-21 NOTE — Progress Notes (Signed)
PROGRESS NOTE  Dylan Weaver AST:[id number] DOB: 07/10/1966 DOA: 06/04/2021 PCP: [name]  HPI/Recap of past 24 hours: 55 y.o. male severe alcohol abuse who presents to the ED for weakness and jaundice. On admission history was limited due to his encephalopathy and was found to be in acute liver failure related to alcohol abuse. Pt was started on IV fluids, antibiotics, PPI, octreotide (which have all being d/ced except for famotidine). Hospital course complicated by drop in hemoglobin to 5.4, FOBT was positive, required about 2U of PRBC. GI was consulted for further management.  Patient currently stable and awaiting SNF placement.    Pt denies any new complaints.    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/metabolic encephalopathy/hepatic encephalopathy ?Hospital delirium Intermittent confusion GI was consulted, EGD on 06/08/2021 showed esophagitis with no signs of bleeding, portal hypertensive gastropathy and gastritis His meld score was 33 on admission MDF on admission was 7.6, was not a candidate for steroids Repeat ammonia level on 06/16/21 WNL Patient received prophylaxis antibiotics for GI bleeding for a total of 7 days, completed Continue midodrine   Possible GI bleed Hemoglobin with mild drop, but has been stable On admission hemoglobin was 5.4, transfused 2 unit of packed red blood cells GI consulted, EGD as above Colonoscopy was done that showed on 06/10/2021 that showed multiple polyps mild diverticulosis nonbleeding internal hemorrhoids Received IV Protonix and octreotide, now transition to p.o. 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 Vs adrenal insufficiency Vs hypothyroidism Osmolality 271, urine osmolality 637, urine sodium 128 TSH-->4.99, free T4 1.17, T3 WNL Cortisol WNL, ACTH pending Fluid restriction for now Daily BMP  Hypotension Stable- baseline soft Noted some episodes, as low as SBP in the 80s, appears asymptomatic LA mildly elevated, no need to trend Monitor closely  Acute kidney injury Hepatorenal syndrome Resolved, creatinine back to baseline, on admission 4.4  Alcohol abuse/dependence Advised to quit Continue MVT, thiamine, folic acid S/P CIWA protocol  Hypomagnesemia/hypokalemia Replace prn It was discussed with renal and they recommended to stop Protonix and start him on a H2 blocker famotidine  Chronic thrombocytopenia Likely 2/2 liver failure Monitor closely  Weakness/deconditioning Physical therapy evaluated the patient and recommended skilled nursing facility.     Malnutrition Type:  Nutrition Problem: Severe Malnutrition Etiology: social / environmental circumstances   Malnutrition Characteristics:  Signs/Symptoms: moderate fat depletion, severe muscle depletion, energy intake < 75% for > or equal to 3 months   Nutrition Interventions:  Interventions: MVI    Estimated body mass index is 20.93 kg/m as calculated from the following:   Height as of this encounter: 6' (1.829 m).   Weight as of this encounter: 70 kg.     Code Status: Full  Family Communication: None at bedside  Disposition Plan: Status is: Inpatient  Remains inpatient appropriate because:Inpatient level of care appropriate due to severity of illness  Dispo: The patient is from: Home              Anticipated d/c is to: SNF              Patient currently is medically stable to d/c.   Difficult to place patient No  Consultants: GI  Procedures: EGD Colonoscopy  Antimicrobials: None  DVT prophylaxis: SCD   Objective: Vitals:   06/21/21  0754 06/21/21 0900 06/21/21 1120 06/21/21 1600  BP: 113/69 95/64 (!) 89/69 91/66  Pulse:  78 70 77  Resp:  18 15 15   Temp:  (!) 97.4 F (36.3 C) (!) 97.4 F (36.3 C) 97.7 F (36.5 C)  TempSrc:  Oral Oral Oral  SpO2:  100% 98% 100%  Weight:      Height:        Intake/Output Summary (Last 24 hours) at 06/21/2021 1800 Last data filed at 06/21/2021 1633 Gross per 24 hour  Intake 593 ml  Output 950 ml  Net -357 ml   Filed Weights   06/19/21 0500 06/20/21 0440 06/21/21 0500  Weight: 70.6 kg 70.8 kg 70 kg    Exam: General: NAD, jaundiced Cardiovascular: S1, S2 present Respiratory: CTAB Abdomen: Soft, nontender, nondistended, bowel sounds present Musculoskeletal: No bilateral pedal edema noted Skin: Jaundiced Psychiatry: Intermittently confused    Data Reviewed: CBC: Recent Labs  Lab 06/16/21 0500 06/17/21 0420 06/18/21 0350 06/19/21 0140 06/20/21 0315  WBC 11.4* 9.0 7.6 7.3 7.7  NEUTROABS 8.5* 6.1 4.8 4.4 4.6  HGB 8.4* 8.3* 8.6* 9.4* 9.2*  HCT 25.8* 25.6* 26.0* 30.3* 27.7*  MCV 100.0 101.6* 101.2* 103.1* 101.5*  PLT 439* 472* 443* 371 422*   Basic Metabolic Panel: Recent Labs  Lab 06/17/21 0420 06/18/21 0350 06/19/21 0140 06/20/21 0315 06/21/21 0350  NA 132* 131* 128* 129* 128*  K 3.6 3.3* 5.1 3.5 3.7  CL 99 100 99 102 98  CO2 22 21* 20* 19* 20*  GLUCOSE 97 91 89 91 83  BUN 6 <5* 6 9 7   CREATININE 0.51* 0.52* 0.45* 0.53* 0.46*  CALCIUM 9.1 8.7* 8.4* 8.6* 9.0  MG 1.7 1.2* 1.7 1.3* 1.7   GFR: Estimated Creatinine Clearance: 103.3 mL/min (A) (by C-G formula based on SCr of 0.46 mg/dL (L)). Liver Function Tests: No results for input(s): AST, ALT, ALKPHOS, BILITOT, PROT, ALBUMIN in the last 168 hours.  No results for input(s): LIPASE, AMYLASE in the last 168 hours. Recent Labs  Lab 06/16/21 1004  AMMONIA 29   Coagulation Profile: No results for input(s): INR, PROTIME in the last 168 hours.  Cardiac Enzymes: No results for input(s): CKTOTAL,  CKMB, CKMBINDEX, TROPONINI in the last 168 hours. BNP (last 3 results) No results for input(s): PROBNP in the last 8760 hours. HbA1C: No results for input(s): HGBA1C in the last 72 hours. CBG: Recent Labs  Lab 06/17/21 2129 06/18/21 0613  GLUCAP 119* 84   Lipid Profile: No results for input(s): CHOL, HDL, LDLCALC, TRIG, CHOLHDL, LDLDIRECT in the last 72 hours. Thyroid Function Tests: Recent Labs    06/20/21 0315  TSH 4.994*  FREET4 1.17*   Anemia Panel: No results for input(s): VITAMINB12, FOLATE, FERRITIN, TIBC, IRON, RETICCTPCT in the last 72 hours. Urine analysis:    Component Value Date/Time   COLORURINE YELLOW 01/24/2021 1601   APPEARANCEUR CLEAR 01/24/2021 1601   LABSPEC 1.009 01/24/2021 1601   PHURINE 6.0 01/24/2021 1601   GLUCOSEU NEGATIVE 01/24/2021 1601   HGBUR NEGATIVE 01/24/2021 1601   BILIRUBINUR NEGATIVE 01/24/2021 1601   KETONESUR NEGATIVE 01/24/2021 1601   PROTEINUR NEGATIVE 01/24/2021 1601   NITRITE NEGATIVE 01/24/2021 1601   LEUKOCYTESUR NEGATIVE 01/24/2021 1601   Sepsis Labs: @LABRCNTIP (procalcitonin:4,lacticidven:4)  )No results found for this or any previous visit (from the past 240 hour(s)).    Studies: No results found.  Scheduled Meds:  (feeding supplement) PROSource Plus  30 mL Oral BID BM   famotidine  40 mg Oral BID   folic acid  1 mg Oral Daily   gabapentin  100 mg Oral TID   magnesium oxide  800 mg Oral BID   midodrine  2.5 mg Oral TID WC   multivitamin with minerals  1 tablet Oral Daily   sodium chloride flush  10-40 mL Intracatheter Q12H   sodium chloride flush  3 mL Intravenous Q12H   thiamine  100 mg Oral Daily    Continuous Infusions:   LOS: 17 days     Briant Cedar, MD [facility]  If 7PM-7AM, please contact night-coverage [email or web address] 06/21/2021, 6:00 PM

## 2021-06-22 DIAGNOSIS — K7201 Acute and subacute hepatic failure with coma: Secondary | ICD-10-CM | POA: Diagnosis not present

## 2021-06-22 LAB — BASIC METABOLIC PANEL
Anion gap: 10 (ref 5–15)
BUN: 7 mg/dL (ref 6–20)
CO2: 22 mmol/L (ref 22–32)
Calcium: 9 mg/dL (ref 8.9–10.3)
Chloride: 99 mmol/L (ref 98–111)
Creatinine, Ser: 0.56 mg/dL — ABNORMAL LOW (ref 0.61–1.24)
GFR, Estimated: >60 mL/min (ref >60)
Glucose, Bld: 85 mg/dL (ref 70–99)
Potassium: 3.6 mmol/L (ref 3.5–5.1)
Sodium: 131 mmol/L — ABNORMAL LOW (ref 135–145)

## 2021-06-22 LAB — CBC WITH DIFFERENTIAL/PLATELET
Abs Immature Granulocytes: 0.21 10*3/uL — ABNORMAL HIGH (ref 0.00–0.07)
Basophils Absolute: 0.1 10*3/uL (ref 0.0–0.1)
Basophils Relative: 1 %
Eosinophils Absolute: 0.3 10*3/uL (ref 0.0–0.5)
Eosinophils Relative: 4 %
HCT: 28.9 % — ABNORMAL LOW (ref 39.0–52.0)
Hemoglobin: 9.2 g/dL — ABNORMAL LOW (ref 13.0–17.0)
Immature Granulocytes: 3 %
Lymphocytes Relative: 24 %
Lymphs Abs: 2 10*3/uL (ref 0.7–4.0)
MCH: 32.6 pg (ref 26.0–34.0)
MCHC: 31.8 g/dL (ref 30.0–36.0)
MCV: 102.5 fL — ABNORMAL HIGH (ref 80.0–100.0)
Monocytes Absolute: 0.7 10*3/uL (ref 0.1–1.0)
Monocytes Relative: 8 %
Neutro Abs: 5.1 10*3/uL (ref 1.7–7.7)
Neutrophils Relative %: 60 %
Platelets: 337 10*3/uL (ref 150–400)
RBC: 2.82 MIL/uL — ABNORMAL LOW (ref 4.22–5.81)
RDW: 16.9 % — ABNORMAL HIGH (ref 11.5–15.5)
WBC: 8.3 10*3/uL (ref 4.0–10.5)
nRBC: 0 % (ref 0.0–0.2)

## 2021-06-22 LAB — MAGNESIUM: Magnesium: 1.5 mg/dL — ABNORMAL LOW (ref 1.7–2.4)

## 2021-06-22 MED ORDER — MAGNESIUM SULFATE 2 GM/50ML IV SOLN
2.0000 g | Freq: Once | INTRAVENOUS | Status: AC
  Administered 2021-06-22: 2 g via INTRAVENOUS
  Filled 2021-06-22: qty 50

## 2021-06-22 NOTE — TOC Progression Note (Signed)
Transition of Care [facility]) - Progression Note    Patient Details  Name: Dylan Weaver MRN: [id number] Date of Birth: 08-19-1966  Transition of Care [facility]) CM/SW Contact  Eduard Roux, [place] Phone Number: 06/22/2021, 10:07 AM  Clinical Narrative:     CSW spoke with patient's Aunt,Vickie- CSW informed patient has no bed offers. CSW explained the SNF process and informed of barriers to placement. CSW advised, did not call Katie/cousin because she was not on emergency contact list. CSW also encourage family to explore care options if the patient progress to a level where SNF is no longer the recommendation. Family states understanding. All questions answered.  Antony Blackbird, MSW, LCSW Clinical Social Worker     Expected Discharge Plan: Skilled Nursing Facility Barriers to Discharge: Inadequate or no insurance, Continued Medical Work up, SNF Pending bed offer  Expected Discharge Plan and Services Expected Discharge Plan: Skilled Nursing Facility In-house Referral: Clinical Social Work       Expected Discharge Date: 06/14/21                                     Social Determinants of Health (SDOH) Interventions    Readmission Risk Interventions No flowsheet data found.

## 2021-06-22 NOTE — TOC Progression Note (Signed)
Transition of Care [facility]) - Progression Note    Patient Details  Name: Dylan Weaver MRN: [id number] Date of Birth: Nov 24, 1965  Transition of Care [facility]) CM/SW Contact  Eduard Roux, [place] Phone Number: 06/22/2021, 4:05 PM  Clinical Narrative:     CSW called [facility]- they declined  CSW sent referral by secure email to [facility] [place] - also left voice message to return call.  Sent Email to [place] [facility] [employer]- informed patient has been difficult to place- requested additional SNFs CSW can send referrals   Antony Blackbird, MSW, LCSW Clinical Social Worker    Expected Discharge Plan: Skilled Nursing Facility Barriers to Discharge: Inadequate or no insurance, Continued Medical Work up, SNF Pending bed offer  Expected Discharge Plan and Services Expected Discharge Plan: Skilled Nursing Facility In-house Referral: Clinical Social Work       Expected Discharge Date: 06/14/21                                     Social Determinants of Health (SDOH) Interventions    Readmission Risk Interventions No flowsheet data found.

## 2021-06-22 NOTE — Progress Notes (Signed)
Occupational Therapy Treatment Patient Details Name: Dylan Weaver MRN: [id number] DOB: [birth year] Today's Date: 06/22/2021   History of present illness Pt is a 55 y.o. male admitted 06/04/21 with weakness, jaundice. Found to have ARF with hepatic encephalopathy, possible GIB. CXR with possible L atelectasis. S/p colonoscopy 9/9 which showed multiple polyps, mild diverticulosis. PMH includes ETOH abuse.   OT comments  Pt with gradual progress towards OT goals. With encouragement, pt able to demo [facility] transfer and toileting task with Min A. Pt noted with impulsive movements and does need cues for optimal hygiene (noted with fecal matter on hands after task - initially declined need for hand hygiene). Encouraged pt to continue [facility] transfers with staff and decrease bedpan use based on presentation today. Due to inability to safely care for self at this time secondary to deficits, continue to recommend SNF rehab.   Recommendations for follow up therapy are one component of a multi-disciplinary discharge planning process, led by the attending physician.  Recommendations may be updated based on patient status, additional functional criteria and insurance authorization.    Follow Up Recommendations  SNF;Supervision/Assistance - 24 hour    Equipment Recommendations  Other (comment) (to be determined; pending progress)    Recommendations for Other Services      Precautions / Restrictions Precautions Precautions: Fall Precaution Comments: soft BP Restrictions Weight Bearing Restrictions: No       Mobility Bed Mobility Overal bed mobility: Modified Independent Bed Mobility: Supine to Sit;Sit to Supine     Supine to sit: Modified independent (Device/Increase time);HOB elevated Sit to supine: Modified independent (Device/Increase time)   General bed mobility comments: HOB elevated    Transfers Overall transfer level: Needs assistance Equipment used: 1 person hand held  assist Transfers: Sit to/from [employer] Sit to Stand: Supervision Stand pivot transfers: Min assist       General transfer comment: Min A to guide hips to [facility], impulsively transferred back to bed without OT assist from [facility]    Balance Overall balance assessment: Needs assistance Sitting-balance support: No upper extremity supported;Feet supported Sitting balance-Leahy Scale: Good     Standing balance support: Single extremity supported;During functional activity Standing balance-Leahy Scale: Poor Standing balance comment: reliant on at least one UE support                           ADL either performed or assessed with clinical judgement   ADL Overall ADL's : Needs assistance/impaired     Grooming: Set up;Bed level;Wash/dry hands Grooming Details (indicate cue type and reason): Noted fecal matter on hands - OT insistent on providing hand sanitizer                 Toilet Transfer: Minimal assistance;Stand-pivot;BSC Toilet Transfer Details (indicate cue type and reason): Initially Min A to guide hips due to difficulty clearing bottom over [facility] armrests. After hygiene, impulsively transferred self back to bed without OT assist Toileting- Clothing Manipulation and Hygiene: Minimal assistance;Sitting/lateral lean;Sit to/from stand Toileting - Clothing Manipulation Details (indicate cue type and reason): Min A for thoroughness as fecal matter noted on pt hands and linens. able to complete hygiene with lateral leans on BSC       General ADL Comments: Pt progressing with ADL independence via [facility] transfer today though noted with continued limitations in cognition impacting safety with daily tasks     Vision   Vision Assessment?: No apparent visual deficits   Perception  Praxis      Cognition Arousal/Alertness: Awake/alert Behavior During Therapy: WFL for tasks assessed/performed;Impulsive Overall Cognitive Status: No family/caregiver  present to determine baseline cognitive functioning Area of Impairment: Orientation;Attention;Memory;Following commands;Awareness;Problem solving;Safety/judgement                 Orientation Level: Disoriented to;Time;Situation Current Attention Level: Selective Memory: Decreased short-term memory Following Commands: Follows one step commands consistently Safety/Judgement: Decreased awareness of safety;Decreased awareness of deficits Awareness: Intellectual Problem Solving: Requires verbal cues;Requires tactile cues;Slow processing General Comments: Pt with continued confusion and noted impulsivity with movements (will get up without assist nearby). Pt initially asks OT "where have you been?" and reports OT had not seen this pt in 2 months (saw him last week). Pt with tangential statements and easily distracted - changing channels when attempting to guide in [facility] transfer        Exercises     Shoulder Instructions       General Comments      Pertinent Vitals/ Pain       Pain Assessment: Faces Faces Pain Scale: No hurt Pain Intervention(s): Monitored during session  Home Living                                          Prior Functioning/Environment              Frequency  Min 2X/week        Progress Toward Goals  OT Goals(current goals can now be found in the care plan section)  Progress towards OT goals: Progressing toward goals  Acute Rehab OT Goals Patient Stated Goal: to fly out of here OT Goal Formulation: With patient Time For Goal Achievement: 07/06/21 Potential to Achieve Goals: Fair ADL Goals Pt Will Perform Grooming: with modified independence;standing Pt Will Perform Lower Body Dressing: with modified independence;sit to/from stand;sitting/lateral leans Pt Will Transfer to Toilet: with modified independence;ambulating  Plan Discharge plan remains appropriate;Frequency remains appropriate    Co-evaluation                  AM-PAC OT "6 Clicks" Daily Activity     Outcome Measure   Help from another person eating meals?: None Help from another person taking care of personal grooming?: A Little Help from another person toileting, which includes using toliet, bedpan, or urinal?: A Little Help from another person bathing (including washing, rinsing, drying)?: A Lot Help from another person to put on and taking off regular upper body clothing?: A Little Help from another person to put on and taking off regular lower body clothing?: A Lot 6 Click Score: 17    End of Session    OT Visit Diagnosis: Unsteadiness on feet (R26.81);Other abnormalities of gait and mobility (R26.89);Muscle weakness (generalized) (M62.81);Other symptoms and signs involving cognitive function   Activity Tolerance Patient tolerated treatment well   Patient Left in bed;with call bell/phone within reach;with bed alarm set   Nurse Communication Mobility status        Time: 1350-1413 OT Time Calculation (min): 23 min  Charges: OT General Charges $OT Visit: 1 Visit OT Treatments $Self Care/Home Management : 23-37 mins  Bradd Canary, OTR/L Acute Rehab Services Office: [phone]   Lorre Munroe 06/22/2021, 2:57 PM

## 2021-06-22 NOTE — Progress Notes (Signed)
PROGRESS NOTE  Dylan Weaver CLE:[id number] DOB: 1966-05-18 DOA: 06/04/2021 PCP: [name]  HPI/Recap of past 24 hours: 55 y.o. male severe alcohol abuse who presents to the ED for weakness and jaundice. On admission history was limited due to his encephalopathy and was found to be in acute liver failure related to alcohol abuse. Pt was started on IV fluids, antibiotics, PPI, octreotide (which have all being d/ced except for famotidine). Hospital course complicated by drop in hemoglobin to 5.4, FOBT was positive, required about 2U of PRBC. GI was consulted for further management.  Patient currently stable and awaiting SNF placement, still no bed offers.  Subjective. Patient was seen and examined today.  No new complaints.  He wants to get some sleep, stating that he is up since 3 AM.  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/metabolic encephalopathy/hepatic encephalopathy ?Hospital delirium Intermittent confusion GI was consulted, EGD on 06/08/2021 showed esophagitis with no signs of bleeding, portal hypertensive gastropathy and gastritis His meld score was 33 on admission MDF on admission was 7.6, was not a candidate for steroids Repeat ammonia level on 06/16/21 WNL Patient received prophylaxis antibiotics for GI bleeding for a total of 7 days, completed Continue midodrine   Possible GI bleed Hemoglobin with mild drop, but has been stable On admission hemoglobin was 5.4, transfused 2 unit of packed red blood cells GI consulted, EGD as above Colonoscopy was done that showed on 06/10/2021 that showed multiple polyps mild diverticulosis nonbleeding internal hemorrhoids Received IV Protonix and  octreotide, now transition to p.o. 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 Fluid restriction for now Daily BMP  Hypotension Stable- baseline soft Noted some episodes, as low as SBP in the 80s, appears asymptomatic LA mildly elevated, no need to trend Monitor closely  Acute kidney injury Hepatorenal syndrome Resolved, creatinine back to baseline, on admission 4.4  Alcohol abuse/dependence Advised to quit Continue MVT, thiamine, folic acid S/P CIWA protocol  Hypomagnesemia/hypokalemia Replace prn It was discussed with renal and they recommended to stop Protonix and start him on a H2 blocker famotidine  Chronic thrombocytopenia Likely 2/2 liver failure Monitor closely  Weakness/deconditioning Physical therapy evaluated the patient and recommended skilled nursing facility. Still no bed offer-TOC is working on it  Malnutrition Type:  Nutrition Problem: Severe Malnutrition Etiology: social / environmental circumstances   Malnutrition Characteristics:  Signs/Symptoms: moderate fat depletion, severe muscle depletion, energy intake < 75% for > or equal to 3 months   Nutrition Interventions:  Interventions: MVI    Estimated body mass index is 20.45 kg/m as calculated from the following:   Height as of this encounter: 6' (1.829 m).   Weight as of this encounter: 68.4 kg.     Code Status: Full  Family Communication: None at bedside  Disposition Plan: Status is: Inpatient  Remains inpatient appropriate because:Inpatient level of care appropriate due to severity of illness  Dispo: The patient is from: Home              Anticipated d/c is to: SNF  Patient currently is medically stable.   Difficult to place patient  yes  Consultants: GI  Procedures: EGD Colonoscopy  Antimicrobials: None  DVT prophylaxis: SCD   Objective: Vitals:   06/22/21 0431 06/22/21 0733 06/22/21 1141 06/22/21 1535  BP: (!) 88/74 (!) 88/59 93/70 (!) 84/59  Pulse: 77 68 68 69  Resp: 18 18 15 15   Temp: 97.9 F (36.6 C) 97.7 F (36.5 C) 97.9 F (36.6 C) 98.1 F (36.7 C)  TempSrc: Oral Oral Oral Oral  SpO2: 98% 98% 98% 98%  Weight: 68.4 kg     Height:        Intake/Output Summary (Last 24 hours) at 06/22/2021 1607 Last data filed at 06/22/2021 1500 Gross per 24 hour  Intake 50 ml  Output 1600 ml  Net -1550 ml    Filed Weights   06/20/21 0440 06/21/21 0500 06/22/21 0431  Weight: 70.8 kg 70 kg 68.4 kg    Exam: General.  Jaundiced gentleman, in no acute distress. Pulmonary.  Lungs clear bilaterally, normal respiratory effort. CV.  Regular rate and rhythm, no JVD, rub or murmur. Abdomen.  Soft, nontender, nondistended, BS positive. CNS.  Alert and oriented .  No focal neurologic deficit. Extremities.  No edema, no cyanosis, pulses intact and symmetrical. Psychiatry.  Judgment and insight appears normal.    Data Reviewed: CBC: Recent Labs  Lab 06/17/21 0420 06/18/21 0350 06/19/21 0140 06/20/21 0315 06/22/21 0542  WBC 9.0 7.6 7.3 7.7 8.3  NEUTROABS 6.1 4.8 4.4 4.6 5.1  HGB 8.3* 8.6* 9.4* 9.2* 9.2*  HCT 25.6* 26.0* 30.3* 27.7* 28.9*  MCV 101.6* 101.2* 103.1* 101.5* 102.5*  PLT 472* 443* 371 422* 337    Basic Metabolic Panel: Recent Labs  Lab 06/18/21 0350 06/19/21 0140 06/20/21 0315 06/21/21 0350 06/22/21 0542  NA 131* 128* 129* 128* 131*  K 3.3* 5.1 3.5 3.7 3.6  CL 100 99 102 98 99  CO2 21* 20* 19* 20* 22  GLUCOSE 91 89 91 83 85  BUN <5* 6 9 7 7   CREATININE 0.52* 0.45* 0.53* 0.46* 0.56*  CALCIUM 8.7* 8.4* 8.6* 9.0 9.0  MG 1.2* 1.7 1.3* 1.7 1.5*    GFR: Estimated Creatinine Clearance: 100.9 mL/min (A) (by C-G formula based on SCr of 0.56 mg/dL (L)). Liver Function Tests: No  results for input(s): AST, ALT, ALKPHOS, BILITOT, PROT, ALBUMIN in the last 168 hours.  No results for input(s): LIPASE, AMYLASE in the last 168 hours. Recent Labs  Lab 06/16/21 1004  AMMONIA 29    Coagulation Profile: No results for input(s): INR, PROTIME in the last 168 hours.  Cardiac Enzymes: No results for input(s): CKTOTAL, CKMB, CKMBINDEX, TROPONINI in the last 168 hours. BNP (last 3 results) No results for input(s): PROBNP in the last 8760 hours. HbA1C: No results for input(s): HGBA1C in the last 72 hours. CBG: Recent Labs  Lab 06/17/21 2129 06/18/21 0613  GLUCAP 119* 84    Lipid Profile: No results for input(s): CHOL, HDL, LDLCALC, TRIG, CHOLHDL, LDLDIRECT in the last 72 hours. Thyroid Function Tests: Recent Labs    06/20/21 0315  TSH 4.994*  FREET4 1.17*    Anemia Panel: No results for input(s): VITAMINB12, FOLATE, FERRITIN, TIBC, IRON, RETICCTPCT in the last 72 hours. Urine analysis:    Component Value Date/Time   COLORURINE YELLOW 01/24/2021 1601   APPEARANCEUR CLEAR 01/24/2021 1601   LABSPEC 1.009 01/24/2021 1601   PHURINE 6.0 01/24/2021 1601   GLUCOSEU NEGATIVE 01/24/2021 1601   HGBUR NEGATIVE 01/24/2021 1601  BILIRUBINUR NEGATIVE 01/24/2021 1601   KETONESUR NEGATIVE 01/24/2021 1601   PROTEINUR NEGATIVE 01/24/2021 1601   NITRITE NEGATIVE 01/24/2021 1601   LEUKOCYTESUR NEGATIVE 01/24/2021 1601   Sepsis Labs: @LABRCNTIP (procalcitonin:4,lacticidven:4)  )No results found for this or any previous visit (from the past 240 hour(s)).    Studies: No results found.  Scheduled Meds:  (feeding supplement) PROSource Plus  30 mL Oral BID BM   famotidine  40 mg Oral BID   folic acid  1 mg Oral Daily   gabapentin  100 mg Oral TID   magnesium oxide  800 mg Oral BID   midodrine  2.5 mg Oral TID WC   multivitamin with minerals  1 tablet Oral Daily   sodium chloride flush  10-40 mL Intracatheter Q12H   sodium chloride flush  3 mL Intravenous Q12H    thiamine  100 mg Oral Daily    Continuous Infusions:   LOS: 18 days    , MD [facility]  If 7PM-7AM, please contact night-coverage [email or web address] 06/22/2021, 4:07 PM

## 2021-06-23 DIAGNOSIS — K7201 Acute and subacute hepatic failure with coma: Secondary | ICD-10-CM | POA: Diagnosis not present

## 2021-06-23 LAB — CBC
HCT: 30.7 % — ABNORMAL LOW (ref 39.0–52.0)
Hemoglobin: 9.8 g/dL — ABNORMAL LOW (ref 13.0–17.0)
MCH: 33 pg (ref 26.0–34.0)
MCHC: 31.9 g/dL (ref 30.0–36.0)
MCV: 103.4 fL — ABNORMAL HIGH (ref 80.0–100.0)
Platelets: 303 10*3/uL (ref 150–400)
RBC: 2.97 MIL/uL — ABNORMAL LOW (ref 4.22–5.81)
RDW: 16.7 % — ABNORMAL HIGH (ref 11.5–15.5)
WBC: 9.1 10*3/uL (ref 4.0–10.5)
nRBC: 0 % (ref 0.0–0.2)

## 2021-06-23 LAB — COMPREHENSIVE METABOLIC PANEL
ALT: 108 U/L — ABNORMAL HIGH (ref 0–44)
AST: 152 U/L — ABNORMAL HIGH (ref 15–41)
Albumin: 2 g/dL — ABNORMAL LOW (ref 3.5–5.0)
Alkaline Phosphatase: 217 U/L — ABNORMAL HIGH (ref 38–126)
Anion gap: 10 (ref 5–15)
BUN: 9 mg/dL (ref 6–20)
CO2: 22 mmol/L (ref 22–32)
Calcium: 9.2 mg/dL (ref 8.9–10.3)
Chloride: 98 mmol/L (ref 98–111)
Creatinine, Ser: 0.52 mg/dL — ABNORMAL LOW (ref 0.61–1.24)
GFR, Estimated: >60 mL/min (ref >60)
Glucose, Bld: 90 mg/dL (ref 70–99)
Potassium: 3.7 mmol/L (ref 3.5–5.1)
Sodium: 130 mmol/L — ABNORMAL LOW (ref 135–145)
Total Bilirubin: 9.4 mg/dL — ABNORMAL HIGH (ref 0.3–1.2)
Total Protein: 7.8 g/dL (ref 6.5–8.1)

## 2021-06-23 LAB — PROTIME-INR
INR: 1.1 (ref 0.8–1.2)
Prothrombin Time: 14.4 seconds (ref 11.4–15.2)

## 2021-06-23 LAB — VITAMIN B12: Vitamin B-12: 341 pg/mL (ref 180–914)

## 2021-06-23 MED ORDER — VITAMIN B-12 1000 MCG PO TABS
1000.0000 ug | ORAL_TABLET | Freq: Every day | ORAL | Status: DC
  Administered 2021-06-23 – 2021-07-05 (×13): 1000 ug via ORAL
  Filled 2021-06-23 (×12): qty 1

## 2021-06-23 NOTE — Progress Notes (Signed)
PROGRESS NOTE  Dylan Weaver Docken DGL:[id number] DOB: 07-01-1966 DOA: 06/04/2021 PCP: [name]  HPI/Recap of past 24 hours: 55 y.o. male severe alcohol abuse who presents to the ED for weakness and jaundice. On admission history was limited due to his encephalopathy and was found to be in acute liver failure related to alcohol abuse. Pt was started on IV fluids, antibiotics, PPI, octreotide (which have all being d/ced except for famotidine). Hospital course complicated by drop in hemoglobin to 5.4, FOBT was positive, required about 2U of PRBC. GI was consulted for further management.  Patient currently stable and awaiting SNF placement, still no bed offers.  Subjective. Patient was seen and examined today.  No new complaints.  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 MDF on admission was 7.6, was not a candidate for steroids Repeat ammonia level on 06/16/21 WNL Patient received prophylaxis antibiotics for GI bleeding for a total of 7 days, completed Continue midodrine   Possible GI bleed Hemoglobin with mild drop, but has been stable On admission hemoglobin was 5.4, transfused 2 unit of packed red blood cells GI consulted, EGD as above Colonoscopy was done that showed on 06/10/2021 that showed multiple polyps mild diverticulosis nonbleeding internal hemorrhoids Received IV Protonix and octreotide, now transition to p.o.  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 Fluid restriction for now Daily BMP  Hypotension Stable- baseline soft Noted some episodes, as low as SBP in the 80s, appears asymptomatic LA mildly elevated, no need to trend Monitor closely  Acute kidney injury Hepatorenal syndrome Resolved, creatinine back to baseline, on admission 4.4  Alcohol abuse/dependence Advised to quit Continue MVT, thiamine, folic acid S/P CIWA protocol  Hypomagnesemia/hypokalemia Replace prn It was discussed with renal and they recommended to stop Protonix and start him on a H2 blocker famotidine  Chronic thrombocytopenia Likely 2/2 liver failure Monitor closely  Weakness/deconditioning Physical therapy evaluated the patient and recommended skilled nursing facility. Still no bed offer-TOC is working on it  Malnutrition Type:  Nutrition Problem: Severe Malnutrition Etiology: social / environmental circumstances   Malnutrition Characteristics:  Signs/Symptoms: moderate fat depletion, severe muscle depletion, energy intake < 75% for > or equal to 3 months   Nutrition Interventions:  Interventions: MVI    Estimated body mass index is 20.42 kg/m as calculated from the following:   Height as of this encounter: 6' (1.829 m).   Weight as of this encounter: 68.3 kg.     Code Status: Full  Family Communication: None at bedside  Disposition Plan: Status is: Inpatient  Remains inpatient appropriate because:Inpatient level of care appropriate due to severity of illness  Dispo: The patient is from: Home              Anticipated d/c is to: SNF              Patient  currently is medically stable.   Difficult to place patient  yes  Consultants: GI  Procedures: EGD Colonoscopy  Antimicrobials: None  DVT prophylaxis: SCD   Objective: Vitals:   06/22/21 2322 06/23/21 0530 06/23/21 0800 06/23/21 1144  BP: 112/81 101/70 101/83 96/71  Pulse: 71 75 82 71  Resp: 15 17 15 14   Temp: (!) 97.4 F (36.3 C) (!) 97.4 F (36.3 C) 97.7 F (36.5 C) (!) 97.5 F (36.4 C)  TempSrc: Oral Oral Oral Oral  SpO2: 98% 97% 99% 100%  Weight:  68.3 kg    Height:        Intake/Output Summary (Last 24 hours) at 06/23/2021 1520 Last data filed at 06/23/2021 0558 Gross per 24 hour  Intake --  Output 1100 ml  Net -1100 ml    Filed Weights   06/21/21 0500 06/22/21 0431 06/23/21 0530  Weight: 70 kg 68.4 kg 68.3 kg    Exam: General.  Frail gentleman, in no acute distress.  Appears jaundiced. Pulmonary.  Lungs clear bilaterally, normal respiratory effort. CV.  Regular rate and rhythm, no JVD, rub or murmur. Abdomen.  Soft, nontender, nondistended, BS positive. CNS.  Alert and oriented x3.  No focal neurologic deficit. Extremities.  No edema, no cyanosis, pulses intact and symmetrical. Psychiatry.  Judgment and insight appears normal.   Data Reviewed: CBC: Recent Labs  Lab 06/17/21 0420 06/18/21 0350 06/19/21 0140 06/20/21 0315 06/22/21 0542 06/23/21 0525  WBC 9.0 7.6 7.3 7.7 8.3 9.1  NEUTROABS 6.1 4.8 4.4 4.6 5.1  --   HGB 8.3* 8.6* 9.4* 9.2* 9.2* 9.8*  HCT 25.6* 26.0* 30.3* 27.7* 28.9* 30.7*  MCV 101.6* 101.2* 103.1* 101.5* 102.5* 103.4*  PLT 472* 443* 371 422* 337 303    Basic Metabolic Panel: Recent Labs  Lab 06/18/21 0350 06/19/21 0140 06/20/21 0315 06/21/21 0350 06/22/21 0542 06/23/21 0525  NA 131* 128* 129* 128* 131* 130*  K 3.3* 5.1 3.5 3.7 3.6 3.7  CL 100 99 102 98 99 98  CO2 21* 20* 19* 20* 22 22  GLUCOSE 91 89 91 83 85 90  BUN <5* 6 9 7 7 9   CREATININE 0.52* 0.45* 0.53* 0.46* 0.56* 0.52*  CALCIUM 8.7* 8.4* 8.6* 9.0 9.0 9.2  MG 1.2* 1.7 1.3* 1.7 1.5*  --     GFR: Estimated  Creatinine Clearance: 100.8 mL/min (A) (by C-G formula based on SCr of 0.52 mg/dL (L)). Liver Function Tests: Recent Labs  Lab 06/23/21 0525  AST 152*  ALT 108*  ALKPHOS 217*  BILITOT 9.4*  PROT 7.8  ALBUMIN 2.0*    No results for input(s): LIPASE, AMYLASE in the last 168 hours. No results for input(s): AMMONIA in the last 168 hours.  Coagulation Profile: Recent Labs  Lab 06/23/21 0525  INR 1.1    Cardiac Enzymes: No results for input(s): CKTOTAL, CKMB, CKMBINDEX, TROPONINI in the last 168 hours. BNP (last 3 results) No results for input(s): PROBNP in the last 8760 hours. HbA1C: No results for input(s): HGBA1C in the last 72 hours. CBG: Recent Labs  Lab 06/17/21 2129 06/18/21 0613  GLUCAP 119* 84    Lipid Profile: No results for input(s): CHOL, HDL, LDLCALC, TRIG, CHOLHDL, LDLDIRECT in the last 72 hours. Thyroid Function Tests: No results for input(s): TSH, T4TOTAL, FREET4, T3FREE, THYROIDAB in the last 72 hours.  Anemia Panel: Recent Labs    06/23/21 0820  VITAMINB12 341   Urine analysis:    Component Value Date/Time   COLORURINE YELLOW 01/24/2021 1601  APPEARANCEUR CLEAR 01/24/2021 1601   LABSPEC 1.009 01/24/2021 1601   PHURINE 6.0 01/24/2021 1601   GLUCOSEU NEGATIVE 01/24/2021 1601   HGBUR NEGATIVE 01/24/2021 1601   BILIRUBINUR NEGATIVE 01/24/2021 1601   KETONESUR NEGATIVE 01/24/2021 1601   PROTEINUR NEGATIVE 01/24/2021 1601   NITRITE NEGATIVE 01/24/2021 1601   LEUKOCYTESUR NEGATIVE 01/24/2021 1601   Sepsis Labs: @LABRCNTIP (procalcitonin:4,lacticidven:4)  )No results found for this or any previous visit (from the past 240 hour(s)).    Studies: No results found.  Scheduled Meds:  (feeding supplement) PROSource Plus  30 mL Oral BID BM   famotidine  40 mg Oral BID   folic acid  1 mg Oral Daily   gabapentin  100 mg Oral TID   magnesium oxide  800 mg Oral BID   midodrine  2.5 mg Oral TID WC   multivitamin with minerals  1 tablet Oral Daily    sodium chloride flush  10-40 mL Intracatheter Q12H   sodium chloride flush  3 mL Intravenous Q12H   thiamine  100 mg Oral Daily    Continuous Infusions:   LOS: 19 days    , MD [facility]  If 7PM-7AM, please contact night-coverage [email or web address] 06/23/2021, 3:20 PM

## 2021-06-23 NOTE — TOC Progression Note (Signed)
Transition of Care [facility]) - Progression Note    Patient Details  Name: Dylan Weaver MRN: [id number] Date of Birth: [birth year]  Transition of Care [facility]) CM/SW Contact  Eduard Roux, [place] Phone Number: 06/23/2021, 3:35 PM  Clinical Narrative:     Patient has no bed offers.  Antony Blackbird, MSW, LCSW Clinical Social Worker    Expected Discharge Plan: Skilled Nursing Facility Barriers to Discharge: Inadequate or no insurance, Continued Medical Work up, SNF Pending bed offer  Expected Discharge Plan and Services Expected Discharge Plan: Skilled Nursing Facility In-house Referral: Clinical Social Work       Expected Discharge Date: 06/14/21                                     Social Determinants of Health (SDOH) Interventions    Readmission Risk Interventions No flowsheet data found.

## 2021-06-23 NOTE — Progress Notes (Signed)
Physical Therapy Treatment Patient Details Name: Dylan Weaver MRN: [id number] DOB: 12/03/1965 Today's Date: 06/23/2021   History of Present Illness Pt is a 55 y.o. male admitted 06/04/21 with weakness, jaundice. Found to have ARF with hepatic encephalopathy, possible GIB. CXR with possible L atelectasis. S/p colonoscopy 9/9 which showed multiple polyps, mild diverticulosis. PMH includes ETOH abuse.    PT Comments    The pt was agreeable to session and was able to demo multiple squat pivot transfers to [facility] session. The pt continues to present with slight confusion and decreased recognition of staff or prior therapy sessions. He required increased verbal cues and reminders to complete tasks, but eventually declined progression of ambulation or OOB mobility, requesting to wait until Monday to progress mobility. The pt will continue to benefit from skilled PT to progress OOB mobility, safety awareness, and dynamic stability to complete transfers with improved safety and reduced assistance.      Recommendations for follow up therapy are one component of a multi-disciplinary discharge planning process, led by the attending physician.  Recommendations may be updated based on patient status, additional functional criteria and insurance authorization.  Follow Up Recommendations  SNF;Supervision/Assistance - 24 hour     Equipment Recommendations  Rolling walker with 5" wheels;3in1 (PT);Wheelchair cushion (measurements PT);Wheelchair (measurements PT)    Recommendations for Other Services       Precautions / Restrictions Precautions Precautions: Fall Precaution Comments: soft BP Restrictions Weight Bearing Restrictions: No     Mobility  Bed Mobility Overal bed mobility: Modified Independent Bed Mobility: Supine to Sit;Sit to Supine     Supine to sit: Modified independent (Device/Increase time);HOB elevated Sit to supine: Modified independent (Device/Increase time)    General bed mobility comments: pt using bed rails, but able to impulsively complete transfers without assist    Transfers Overall transfer level: Needs assistance Equipment used: 1 person hand held assist Transfers: Squat Pivot Transfers     Squat pivot transfers: Min guard     General transfer comment: pt completing with minG, denies need for assist and refused use of RW. pt able to complete without knees buckling at this time, but declined all attempts at mobility progression  Ambulation/Gait             General Gait Details: pt declined      Balance Overall balance assessment: Needs assistance Sitting-balance support: No upper extremity supported;Feet supported Sitting balance-Leahy Scale: Good Sitting balance - Comments: fair static sitting EOB   Standing balance support: Single extremity supported;During functional activity Standing balance-Leahy Scale: Poor Standing balance comment: reliant on at least one UE support                            Cognition Arousal/Alertness: Awake/alert Behavior During Therapy: Agitated;Impulsive Overall Cognitive Status: No family/caregiver present to determine baseline cognitive functioning Area of Impairment: Orientation;Attention;Memory;Following commands;Awareness;Problem solving;Safety/judgement                 Orientation Level: Disoriented to;Situation Current Attention Level: Focused Memory: Decreased short-term memory Following Commands: Follows one step commands consistently Safety/Judgement: Decreased awareness of safety;Decreased awareness of deficits Awareness: Intellectual Problem Solving: Requires verbal cues;Requires tactile cues;Slow processing General Comments: pt requiring increased cues, short attention span today but became frustrated by reminders of goal. Pt insistant on doing transfers "his way" (pivot without use of DME) at this time. states he wants to wait until monday to start therapy  (did not recall that  he has had prior therapy sessions). pt became increasingly agitated through session when encouraged to mobilize OOB, but when PT offered to finish session to avoid further frustration the pt became very apologetic and states continued interest in therapy      Exercises      General Comments General comments (skin integrity, edema, etc.): pt with slightly bloody BM during session, RN alerted      Pertinent Vitals/Pain Pain Assessment: No/denies pain Pain Intervention(s): Monitored during session     PT Goals (current goals can now be found in the care plan section) Acute Rehab PT Goals Patient Stated Goal: to wait until monday to walk PT Goal Formulation: With patient Time For Goal Achievement: 06/20/21 Potential to Achieve Goals: Good Progress towards PT goals: Progressing toward goals    Frequency    Min 2X/week      PT Plan Current plan remains appropriate       AM-PAC PT "6 Clicks" Mobility   Outcome Measure  Help needed turning from your back to your side while in a flat bed without using bedrails?: None Help needed moving from lying on your back to sitting on the side of a flat bed without using bedrails?: None Help needed moving to and from a bed to a chair (including a wheelchair)?: A Little Help needed standing up from a chair using your arms (e.g., wheelchair or bedside chair)?: A Little Help needed to walk in hospital room?: A Lot Help needed climbing 3-5 steps with a railing? : Total 6 Click Score: 17    End of Session Equipment Utilized During Treatment: Gait belt Activity Tolerance: Treatment limited secondary to agitation (pt declining attempts at progression this session) Patient left: in bed;with call bell/phone within reach;with bed alarm set Nurse Communication: Mobility status PT Visit Diagnosis: Muscle weakness (generalized) (M62.81);Unsteadiness on feet (R26.81);Difficulty in walking, not elsewhere classified (R26.2)      Time: 1333-1400 PT Time Calculation (min) (ACUTE ONLY): 27 min  Charges:  $Therapeutic Activity: 23-37 mins                     Vickki Muff, PT, DPT   Acute Rehabilitation Department Pager #: [phone]   Ronnie Derby 06/23/2021, 2:35 PM

## 2021-06-24 DIAGNOSIS — K7201 Acute and subacute hepatic failure with coma: Secondary | ICD-10-CM | POA: Diagnosis not present

## 2021-06-24 NOTE — Plan of Care (Signed)
  Problem: Education: Goal: Knowledge of General Education information will improve Description Including pain rating scale, medication(s)/side effects and non-pharmacologic comfort measures Outcome: Progressing   

## 2021-06-24 NOTE — Progress Notes (Signed)
Pt found on floor in pile of stool.  Multiple staff assisted to safely return to bed and clean Pt.  Pt rude and inappropriate to various staff throughout.  Vitals stable.  Will make MD aware and complete post fall huddle and safety zone.

## 2021-06-24 NOTE — Progress Notes (Signed)
PROGRESS NOTE  Dylan Weaver HEN:[id number] DOB: 31-Mar-1966 DOA: 06/04/2021 PCP: [name]  HPI/Recap of past 24 hours: 55 y.o. male severe alcohol abuse who presents to the ED for weakness and jaundice. On admission history was limited due to his encephalopathy and was found to be in acute liver failure related to alcohol abuse. Pt was started on IV fluids, antibiotics, PPI, octreotide (which have all being d/ced except for famotidine). Hospital course complicated by drop in hemoglobin to 5.4, FOBT was positive, required about 2U of PRBC. GI was consulted for further management.  Patient currently stable and awaiting SNF placement, still no bed offers. Difficult to place due to his [employer] and limited access.  9/23: Had a fall earlier today, no injuries.  Per patient he was trying to go to bedside commode, stating that he learned his lesson and will not try that again.  Subjective. Patient was seen and examined today.  When asked about the fall, he was saying sorry I try to get out of the bed thinking that I can reach bedside commode which is just next to the bed.  Also stating that I learned my lesson and I will not try to do that again.  Denies any injuries.  No pain.  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 MDF on admission was 7.6, was not a candidate for steroids Repeat ammonia level on 06/16/21 WNL Patient  received prophylaxis antibiotics for GI bleeding for a total of 7 days, completed Continue midodrine  Fall.  No acute injuries.  Patient understand the consequences and stating that he will not try again. -Keep the bed at lowest level. -Continue to monitor   Possible GI bleed Hemoglobin with mild drop, but has been stable On admission hemoglobin was 5.4, transfused 2 unit of packed red blood cells GI consulted, EGD as above Colonoscopy was done that showed on 06/10/2021 that showed multiple polyps mild diverticulosis nonbleeding internal hemorrhoids Received IV Protonix and octreotide, now transition to p.o. 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 Fluid restriction for now Daily BMP  Hypotension Stable- baseline soft Noted some episodes, as low as SBP in the 80s, appears asymptomatic LA mildly elevated, no need to trend Monitor closely  Acute kidney injury Hepatorenal syndrome Resolved, creatinine back to baseline, on admission 4.4  Alcohol abuse/dependence Advised to quit Continue MVT, thiamine, folic acid S/P CIWA protocol  Hypomagnesemia/hypokalemia Replace prn It was discussed with renal and they recommended to stop Protonix and start him on a H2 blocker famotidine  Chronic thrombocytopenia Likely 2/2 liver failure Monitor closely  Weakness/deconditioning Physical therapy evaluated the patient and recommended skilled nursing facility. Still no bed offer-TOC is working on it  Malnutrition Type:  Nutrition Problem: Severe Malnutrition Etiology: social / environmental circumstances   Malnutrition Characteristics:  Signs/Symptoms: moderate fat depletion, severe muscle depletion, energy  intake < 75% for > or equal to 3  months   Nutrition Interventions:  Interventions: MVI    Estimated body mass index is 20.09 kg/m as calculated from the following:   Height as of this encounter: 6' (1.829 m).   Weight as of this encounter: 67.2 kg.     Code Status: Full  Family Communication: None at bedside  Disposition Plan: Status is: Inpatient  Remains inpatient appropriate because:Inpatient level of care appropriate due to severity of illness  Dispo: The patient is from: Home              Anticipated d/c is to: SNF              Patient currently is medically stable.   Difficult to place patient yes  Consultants: GI  Procedures: EGD Colonoscopy  Antimicrobials: None  DVT prophylaxis: SCD   Objective: Vitals:   06/24/21 0324 06/24/21 0326 06/24/21 0829 06/24/21 1220  BP: (!) 88/63 100/71 92/68 92/73   Pulse: 61  76   Resp: 20  17 20   Temp: 97.9 F (36.6 C)  98.3 F (36.8 C) 97.9 F (36.6 C)  TempSrc: Oral  Oral Oral  SpO2: 100%  100% 96%  Weight: 67.2 kg     Height:        Intake/Output Summary (Last 24 hours) at 06/24/2021 1543 Last data filed at 06/24/2021 1223 Gross per 24 hour  Intake 240 ml  Output --  Net 240 ml    Filed Weights   06/22/21 0431 06/23/21 0530 06/24/21 0324  Weight: 68.4 kg 68.3 kg 67.2 kg    Exam: General.  Malnourished gentleman, in no acute distress. Pulmonary.  Lungs clear bilaterally, normal respiratory effort. CV.  Regular rate and rhythm, no JVD, rub or murmur. Abdomen.  Soft, nontender, nondistended, BS positive. CNS.  Alert and oriented .  No focal neurologic deficit. Extremities.  No edema, no cyanosis, pulses intact and symmetrical. Psychiatry.  Judgment and insight appears normal.   Data Reviewed: CBC: Recent Labs  Lab 06/18/21 0350 06/19/21 0140 06/20/21 0315 06/22/21 0542 06/23/21 0525  WBC 7.6 7.3 7.7 8.3 9.1  NEUTROABS 4.8 4.4 4.6 5.1  --   HGB 8.6* 9.4* 9.2* 9.2* 9.8*  HCT 26.0* 30.3* 27.7* 28.9* 30.7*  MCV 101.2* 103.1*  101.5* 102.5* 103.4*  PLT 443* 371 422* 337 303    Basic Metabolic Panel: Recent Labs  Lab 06/18/21 0350 06/19/21 0140 06/20/21 0315 06/21/21 0350 06/22/21 0542 06/23/21 0525  NA 131* 128* 129* 128* 131* 130*  K 3.3* 5.1 3.5 3.7 3.6 3.7  CL 100 99 102 98 99 98  CO2 21* 20* 19* 20* 22 22  GLUCOSE 91 89 91 83 85 90  BUN <5* 6 9 7 7 9   CREATININE 0.52* 0.45* 0.53* 0.46* 0.56* 0.52*  CALCIUM 8.7* 8.4* 8.6* 9.0 9.0 9.2  MG 1.2* 1.7 1.3* 1.7 1.5*  --     GFR: Estimated Creatinine Clearance: 99.2 mL/min (A) (by C-G formula based on SCr of 0.52 mg/dL (L)). Liver Function Tests: Recent Labs  Lab 06/23/21 0525  AST 152*  ALT 108*  ALKPHOS 217*  BILITOT 9.4*  PROT 7.8  ALBUMIN 2.0*     No results for input(s): LIPASE, AMYLASE in the last 168 hours. No results for input(s): AMMONIA in the last 168 hours.  Coagulation Profile: Recent Labs  Lab 06/23/21 0525  INR 1.1     Cardiac Enzymes: No results for input(s): CKTOTAL, CKMB, CKMBINDEX, TROPONINI in  the last 168 hours. BNP (last 3 results) No results for input(s): PROBNP in the last 8760 hours. HbA1C: No results for input(s): HGBA1C in the last 72 hours. CBG: Recent Labs  Lab 06/17/21 2129 06/18/21 0613  GLUCAP 119* 84    Lipid Profile: No results for input(s): CHOL, HDL, LDLCALC, TRIG, CHOLHDL, LDLDIRECT in the last 72 hours. Thyroid Function Tests: No results for input(s): TSH, T4TOTAL, FREET4, T3FREE, THYROIDAB in the last 72 hours.  Anemia Panel: Recent Labs    06/23/21 0820  VITAMINB12 341    Urine analysis:    Component Value Date/Time   COLORURINE YELLOW 01/24/2021 1601   APPEARANCEUR CLEAR 01/24/2021 1601   LABSPEC 1.009 01/24/2021 1601   PHURINE 6.0 01/24/2021 1601   GLUCOSEU NEGATIVE 01/24/2021 1601   HGBUR NEGATIVE 01/24/2021 1601   BILIRUBINUR NEGATIVE 01/24/2021 1601   KETONESUR NEGATIVE 01/24/2021 1601   PROTEINUR NEGATIVE 01/24/2021 1601   NITRITE NEGATIVE 01/24/2021 1601    LEUKOCYTESUR NEGATIVE 01/24/2021 1601   Sepsis Labs: @LABRCNTIP (procalcitonin:4,lacticidven:4)  )No results found for this or any previous visit (from the past 240 hour(s)).    Studies: No results found.  Scheduled Meds:  (feeding supplement) PROSource Plus  30 mL Oral BID BM   famotidine  40 mg Oral BID   folic acid  1 mg Oral Daily   gabapentin  100 mg Oral TID   magnesium oxide  800 mg Oral BID   midodrine  2.5 mg Oral TID WC   multivitamin with minerals  1 tablet Oral Daily   sodium chloride flush  10-40 mL Intracatheter Q12H   sodium chloride flush  3 mL Intravenous Q12H   thiamine  100 mg Oral Daily   vitamin B-12  1,000 mcg Oral Daily    Continuous Infusions:   LOS: 20 days    , MD [facility]  If 7PM-7AM, please contact night-coverage [email or web address] 06/24/2021, 3:43 PM

## 2021-06-24 NOTE — Progress Notes (Signed)
Pt found down on floor beside ed by staff member. Pt reports that he was trying to make it to the bedside commode. Pt denies ay injuries. Vital signs were checked and were WNL. Pt in no apparent distress. Family and MD on call were notified. No answer from family member. MD on call did not request any additional testing. Pt was cleaned up and assisted back in the bed. Pt verbalized that he has learned his lesson and will not try this again. Pt resting comfortably in bed. Will continue t monitor.   Jadd Gasior M

## 2021-06-24 NOTE — TOC Progression Note (Signed)
Transition of Care [facility]) - Progression Note    Patient Details  Name: Dylan Weaver MRN: [id number] Date of Birth: 26-Jun-1966  [facility] CM/SW Contact  Eduard Roux, [place] Phone Number: 06/24/2021, 1:00 PM  Clinical Narrative:     Patient has no bed offers-  9/21- [facility] Clinical Social Worker  B. Wiley informed CSW- This veteran does not have access to the other list of facilities due to his service connection. CSW has reached out to approved [facility] facilities, that were provided by Child psychotherapist  CSW has reached out to all facilities:  [facility]- faxed referral- left messages- no response [facility]-emailed clinicals no response-left vice message  [facility]-declined [facility] - declined [facility]-left voice messages no response [facility]-faxed referral. Left message no responce [facility]- declined- has not renewed contract with [facility] rehab- Rol[facility] Arbour -called left message no response 9/23-spoke with admissions/Tanika- faxed referral today- waiting on response  TOC will continue to follow and assist with discharge planning.  Antony Blackbird, MSW, LCSW Clinical Social Worker      Expected Discharge Plan: Skilled Nursing Facility Barriers to Discharge: Inadequate or no insurance, Continued Medical Work up, SNF Pending bed offer  Expected Discharge Plan and Services Expected Discharge Plan: Skilled Nursing Facility In-house Referral: Clinical Social Work       Expected Discharge Date: 06/14/21                                     Social Determinants of Health (SDOH) Interventions    Readmission Risk Interventions No flowsheet data found.

## 2021-06-25 DIAGNOSIS — K7201 Acute and subacute hepatic failure with coma: Secondary | ICD-10-CM | POA: Diagnosis not present

## 2021-06-25 MED ORDER — IBUPROFEN 400 MG PO TABS
400.0000 mg | ORAL_TABLET | Freq: Four times a day (QID) | ORAL | Status: DC | PRN
  Administered 2021-06-25: 400 mg via ORAL
  Filled 2021-06-25 (×2): qty 1

## 2021-06-25 NOTE — Progress Notes (Signed)
Received verbal order from MD for advil oral PRN 400 mg q 6hr.   Lawson Radar, RN

## 2021-06-25 NOTE — Progress Notes (Deleted)
Date and time results received: 06/25/21 0145  Lab: Hgb Critical Value: 8.2  No visible bleeding, will have value rechecked.

## 2021-06-25 NOTE — Progress Notes (Signed)
PROGRESS NOTE  Dylan Weaver BMW:[id number] DOB: 08-Mar-1966 DOA: 06/04/2021 PCP: [name]  HPI/Recap of past 24 hours: 55 y.o. male severe alcohol abuse who presents to the ED for weakness and jaundice. On admission history was limited due to his encephalopathy and was found to be in acute liver failure related to alcohol abuse. Pt was started on IV fluids, antibiotics, PPI, octreotide (which have all being d/ced except for famotidine). Hospital course complicated by drop in hemoglobin to 5.4, FOBT was positive, required about 2U of PRBC. GI was consulted for further management.  Patient currently stable and awaiting SNF placement, still no bed offers. Difficult to place due to his [employer] and limited access.  9/23: Had a fall earlier today, no injuries.  Per patient he was trying to go to bedside commode, stating that he learned his lesson and will not try that again.  Subjective. Patient was having some mild headaches.  No nausea or vomiting, no photophobia, no other deficit.  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 MDF on admission was 7.6, was not a candidate for steroids Repeat ammonia level on 06/16/21 WNL Patient received prophylaxis antibiotics for GI bleeding for a total of 7 days, completed Continue midodrine  Fall.  No acute injuries.  Patient understand the consequences and stating that he will  not try again. -Keep the bed at lowest level. -Continue to monitor   Possible GI bleed Hemoglobin with mild drop, but has been stable On admission hemoglobin was 5.4, transfused 2 unit of packed red blood cells GI consulted, EGD as above Colonoscopy was done that showed on 06/10/2021 that showed multiple polyps mild diverticulosis nonbleeding internal hemorrhoids Received IV Protonix and octreotide, now transition to p.o. 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 Fluid restriction for now Daily BMP  Hypotension Stable- baseline soft Noted some episodes, as low as SBP in the 80s, appears asymptomatic LA mildly elevated, no need to trend Monitor closely  Acute kidney injury Hepatorenal syndrome Resolved, creatinine back to baseline, on admission 4.4  Alcohol abuse/dependence Advised to quit Continue MVT, thiamine, folic acid S/P CIWA protocol  Hypomagnesemia/hypokalemia Replace prn It was discussed with renal and they recommended to stop Protonix and start him on a H2 blocker famotidine  Chronic thrombocytopenia Likely 2/2 liver failure Monitor closely  Weakness/deconditioning Physical therapy evaluated the patient and recommended skilled nursing facility. Still no bed offer-TOC is working on it  Malnutrition Type:  Nutrition Problem: Severe Malnutrition Etiology: social / environmental circumstances   Malnutrition Characteristics:  Signs/Symptoms: moderate fat depletion, severe muscle depletion, energy intake < 75% for > or equal to 3 months   Nutrition Interventions:  Interventions: MVI    Estimated body mass index is 20.45 kg/m as calculated from the following:   Height as of this encounter: 6' (1.829 m).   Weight  as of this encounter: 68.4 kg.     Code Status: Full  Family Communication: None at bedside  Disposition Plan: Status is: Inpatient  Remains inpatient appropriate because:Inpatient level of care appropriate due to severity of illness  Dispo: The patient is from: Home              Anticipated d/c is to: SNF              Patient currently is medically stable.   Difficult to place patient yes  Consultants: GI  Procedures: EGD Colonoscopy  Antimicrobials: None  DVT prophylaxis: SCD   Objective: Vitals:   06/25/21 0500 06/25/21 0819 06/25/21 1235 06/25/21 1626  BP:  100/71 99/70 92/68   Pulse:  76 71 64  Resp:   19 20  Temp:  97.7 F (36.5 C) 98.1 F (36.7 C) (!) 97.1 F (36.2 C)  TempSrc:  Oral Axillary Axillary  SpO2:  100% 98% 99%  Weight: 68.4 kg     Height:        Intake/Output Summary (Last 24 hours) at 06/25/2021 1726 Last data filed at 06/25/2021 1600 Gross per 24 hour  Intake 716 ml  Output 451 ml  Net 265 ml    Filed Weights   06/23/21 0530 06/24/21 0324 06/25/21 0500  Weight: 68.3 kg 67.2 kg 68.4 kg    Exam: General.  Frail gentleman, in no acute distress. Pulmonary.  Lungs clear bilaterally, normal respiratory effort. CV.  Regular rate and rhythm, no JVD, rub or murmur. Abdomen.  Soft, nontender, nondistended, BS positive. CNS.  Alert and oriented .  No focal neurologic deficit. Extremities.  No edema, no cyanosis, pulses intact and symmetrical. Psychiatry.  Judgment and insight appears normal.   Data Reviewed: CBC: Recent Labs  Lab 06/19/21 0140 06/20/21 0315 06/22/21 0542 06/23/21 0525  WBC 7.3 7.7 8.3 9.1  NEUTROABS 4.4 4.6 5.1  --   HGB 9.4* 9.2* 9.2* 9.8*  HCT 30.3* 27.7* 28.9* 30.7*  MCV 103.1* 101.5* 102.5* 103.4*  PLT 371 422* 337 303    Basic Metabolic Panel: Recent Labs  Lab 06/19/21 0140 06/20/21 0315 06/21/21 0350 06/22/21 0542 06/23/21 0525  NA 128* 129* 128* 131* 130*  K 5.1 3.5 3.7 3.6 3.7  CL 99 102 98  99 98  CO2 20* 19* 20* 22 22  GLUCOSE 89 91 83 85 90  BUN 6 9 7 7 9   CREATININE 0.45* 0.53* 0.46* 0.56* 0.52*  CALCIUM 8.4* 8.6* 9.0 9.0 9.2  MG 1.7 1.3* 1.7 1.5*  --     GFR: Estimated Creatinine Clearance: 100.9 mL/min (A) (by C-G formula based on SCr of 0.52 mg/dL (L)). Liver Function Tests: Recent Labs  Lab 06/23/21 0525  AST 152*  ALT 108*  ALKPHOS 217*  BILITOT 9.4*  PROT 7.8  ALBUMIN 2.0*     No results for input(s): LIPASE, AMYLASE in the last 168 hours. No results for input(s): AMMONIA in the last 168 hours.  Coagulation Profile: Recent Labs  Lab 06/23/21 0525  INR 1.1     Cardiac Enzymes: No results for input(s): CKTOTAL, CKMB, CKMBINDEX, TROPONINI in the last 168 hours. BNP (last 3 results) No results for input(s): PROBNP in the last 8760 hours. HbA1C: No results for input(s): HGBA1C in the last 72 hours. CBG: No results for input(s): GLUCAP in the last 168 hours.  Lipid Profile: No results for input(s): CHOL, HDL, LDLCALC, TRIG, CHOLHDL, LDLDIRECT in the last 72 hours. Thyroid Function Tests: No results for  input(s): TSH, T4TOTAL, FREET4, T3FREE, THYROIDAB in the last 72 hours.  Anemia Panel: Recent Labs    06/23/21 0820  VITAMINB12 341    Urine analysis:    Component Value Date/Time   COLORURINE YELLOW 01/24/2021 1601   APPEARANCEUR CLEAR 01/24/2021 1601   LABSPEC 1.009 01/24/2021 1601   PHURINE 6.0 01/24/2021 1601   GLUCOSEU NEGATIVE 01/24/2021 1601   HGBUR NEGATIVE 01/24/2021 1601   BILIRUBINUR NEGATIVE 01/24/2021 1601   KETONESUR NEGATIVE 01/24/2021 1601   PROTEINUR NEGATIVE 01/24/2021 1601   NITRITE NEGATIVE 01/24/2021 1601   LEUKOCYTESUR NEGATIVE 01/24/2021 1601   Sepsis Labs: @LABRCNTIP (procalcitonin:4,lacticidven:4)  )No results found for this or any previous visit (from the past 240 hour(s)).    Studies: No results found.  Scheduled Meds:  (feeding supplement) PROSource Plus  30 mL Oral BID BM   famotidine  40 mg  Oral BID   folic acid  1 mg Oral Daily   gabapentin  100 mg Oral TID   magnesium oxide  800 mg Oral BID   midodrine  2.5 mg Oral TID WC   multivitamin with minerals  1 tablet Oral Daily   sodium chloride flush  10-40 mL Intracatheter Q12H   sodium chloride flush  3 mL Intravenous Q12H   thiamine  100 mg Oral Daily   vitamin B-12  1,000 mcg Oral Daily    Continuous Infusions:   LOS: 21 days    , MD [facility]  If 7PM-7AM, please contact night-coverage [email or web address] 06/25/2021, 5:26 PM

## 2021-06-26 DIAGNOSIS — K7201 Acute and subacute hepatic failure with coma: Secondary | ICD-10-CM | POA: Diagnosis not present

## 2021-06-26 NOTE — Progress Notes (Signed)
PROGRESS NOTE  Dylan Weaver XYV:[id number] DOB: [birth year] DOA: 06/04/2021 PCP: [name]  HPI/Recap of past 24 hours: 55 y.o. male severe alcohol abuse who presents to the ED for weakness and jaundice. On admission history was limited due to his encephalopathy and was found to be in acute liver failure related to alcohol abuse. Pt was started on IV fluids, antibiotics, PPI, octreotide (which have all being d/ced except for famotidine). Hospital course complicated by drop in hemoglobin to 5.4, FOBT was positive, required about 2U of PRBC. GI was consulted for further management.  Patient currently stable and awaiting SNF placement, still no bed offers. Difficult to place due to his [employer] and limited access.  9/23: Had a fall earlier today, no injuries.  Per patient he was trying to go to bedside commode, stating that he learned his lesson and will not try that again.  Subjective. Patient was seen and examined.  No new complaints.  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 MDF on admission was 7.6, was not a candidate for steroids Repeat ammonia level on 06/16/21 WNL Patient received prophylaxis antibiotics for GI bleeding for a total of 7 days, completed Continue midodrine  Fall.  No acute injuries.  Patient understand the consequences and stating that he will not try again. -Keep the bed at lowest  level. -Continue to monitor   Possible GI bleed Hemoglobin with mild drop, but has been stable On admission hemoglobin was 5.4, transfused 2 unit of packed red blood cells GI consulted, EGD as above Colonoscopy was done that showed on 06/10/2021 that showed multiple polyps mild diverticulosis nonbleeding internal hemorrhoids Received IV Protonix and octreotide, now transition to p.o. 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 Fluid restriction for now Daily BMP  Hypotension Stable- baseline soft Noted some episodes, as low as SBP in the 80s, appears asymptomatic LA mildly elevated, no need to trend Monitor closely  Acute kidney injury Hepatorenal syndrome Resolved, creatinine back to baseline, on admission 4.4  Alcohol abuse/dependence Advised to quit Continue MVT, thiamine, folic acid S/P CIWA protocol  Hypomagnesemia/hypokalemia Replace prn It was discussed with renal and they recommended to stop Protonix and start him on a H2 blocker famotidine  Chronic thrombocytopenia Likely 2/2 liver failure Monitor closely  Weakness/deconditioning Physical therapy evaluated the patient and recommended skilled nursing facility. Still no bed offer-TOC is working on it  Malnutrition Type:  Nutrition Problem: Severe Malnutrition Etiology: social / environmental circumstances   Malnutrition Characteristics:  Signs/Symptoms: moderate fat depletion, severe muscle depletion, energy intake < 75% for > or equal to 3 months   Nutrition Interventions:  Interventions: MVI    Estimated body mass index is 20.51 kg/m as calculated from the following:   Height as of this encounter: 6' (1.829 m).   Weight as of this encounter: 68.6 kg.  Code Status: Full  Family Communication: None at bedside  Disposition Plan: Status is: Inpatient  Remains inpatient appropriate because:Inpatient level of care appropriate due to severity of illness  Dispo: The patient is from: Home              Anticipated d/c is to: SNF              Patient currently is medically stable.   Difficult to place patient yes  Consultants: GI  Procedures: EGD Colonoscopy  Antimicrobials: None  DVT prophylaxis: SCD   Objective: Vitals:   06/26/21 0600 06/26/21 0759 06/26/21 1214 06/26/21 1635  BP:  106/79 101/72 99/72  Pulse:  82 77 80  Resp:  17 16 18   Temp:  98 F (36.7 C) 97.9 F (36.6 C) (!) 97.4 F (36.3 C)  TempSrc:  Oral Oral Oral  SpO2:  100% 100% 98%  Weight: 68.6 kg     Height:        Intake/Output Summary (Last 24 hours) at 06/26/2021 1715 Last data filed at 06/26/2021 1600 Gross per 24 hour  Intake 118 ml  Output 675 ml  Net -557 ml    Filed Weights   06/24/21 0324 06/25/21 0500 06/26/21 0600  Weight: 67.2 kg 68.4 kg 68.6 kg    Exam: General.  Frail gentleman, in no acute distress. Pulmonary.  Lungs clear bilaterally, normal respiratory effort. CV.  Regular rate and rhythm, no JVD, rub or murmur. Abdomen.  Soft, nontender, nondistended, BS positive. CNS.  Alert and oriented .  No focal neurologic deficit. Extremities.  No edema, no cyanosis, pulses intact and symmetrical. Psychiatry.  Judgment and insight appears normal.   Data Reviewed: CBC: Recent Labs  Lab 06/20/21 0315 06/22/21 0542 06/23/21 0525  WBC 7.7 8.3 9.1  NEUTROABS 4.6 5.1  --   HGB 9.2* 9.2* 9.8*  HCT 27.7* 28.9* 30.7*  MCV 101.5* 102.5* 103.4*  PLT 422* 337 303    Basic Metabolic Panel: Recent Labs  Lab 06/20/21 0315 06/21/21 0350 06/22/21 0542 06/23/21 0525  NA 129* 128* 131* 130*  K 3.5 3.7 3.6 3.7  CL 102 98 99 98  CO2 19* 20* 22 22  GLUCOSE 91 83 85 90  BUN 9 7 7 9   CREATININE 0.53* 0.46* 0.56* 0.52*  CALCIUM  8.6* 9.0 9.0 9.2  MG 1.3* 1.7 1.5*  --     GFR: Estimated Creatinine Clearance: 101.2 mL/min (A) (by C-G formula based on SCr of 0.52 mg/dL (L)). Liver Function Tests: Recent Labs  Lab 06/23/21 0525  AST 152*  ALT 108*  ALKPHOS 217*  BILITOT 9.4*  PROT 7.8  ALBUMIN 2.0*     No results for input(s): LIPASE, AMYLASE in the last 168 hours. No results for input(s): AMMONIA in the last 168 hours.  Coagulation Profile: Recent Labs  Lab 06/23/21 0525  INR 1.1     Cardiac Enzymes: No results for input(s): CKTOTAL, CKMB, CKMBINDEX, TROPONINI in the last 168 hours. BNP (last 3 results) No results for input(s): PROBNP in the last 8760 hours. HbA1C: No results for input(s): HGBA1C in the last 72 hours. CBG: No results for input(s): GLUCAP in the last 168 hours.  Lipid Profile: No results for input(s): CHOL, HDL, LDLCALC, TRIG, CHOLHDL, LDLDIRECT in the last 72 hours. Thyroid Function Tests: No results for input(s): TSH, T4TOTAL, FREET4, T3FREE, THYROIDAB in the last 72 hours.  Anemia Panel: No results for input(s): VITAMINB12, FOLATE, FERRITIN, TIBC, IRON, RETICCTPCT in the last 72 hours.  Urine analysis:    Component Value Date/Time   COLORURINE YELLOW 01/24/2021 1601   APPEARANCEUR CLEAR 01/24/2021 1601   LABSPEC 1.009 01/24/2021 1601   PHURINE 6.0 01/24/2021 1601   GLUCOSEU NEGATIVE 01/24/2021 1601   HGBUR NEGATIVE 01/24/2021 1601   BILIRUBINUR NEGATIVE 01/24/2021 1601   KETONESUR NEGATIVE 01/24/2021 1601   PROTEINUR NEGATIVE 01/24/2021 1601   NITRITE NEGATIVE 01/24/2021 1601   LEUKOCYTESUR NEGATIVE 01/24/2021 1601   Sepsis Labs: @LABRCNTIP (procalcitonin:4,lacticidven:4)  )No results found for this or any previous visit (from the past 240 hour(s)).    Studies: No results found.  Scheduled Meds:  (feeding supplement) PROSource Plus  30 mL Oral BID BM   famotidine  40 mg Oral BID   folic acid  1 mg Oral Daily   gabapentin  100 mg Oral TID   magnesium  oxide  800 mg Oral BID   midodrine  2.5 mg Oral TID WC   multivitamin with minerals  1 tablet Oral Daily   sodium chloride flush  10-40 mL Intracatheter Q12H   sodium chloride flush  3 mL Intravenous Q12H   thiamine  100 mg Oral Daily   vitamin B-12  1,000 mcg Oral Daily    Continuous Infusions:   LOS: 22 days    , MD [facility]  If 7PM-7AM, please contact night-coverage [email or web address] 06/26/2021, 5:15 PM

## 2021-06-27 DIAGNOSIS — K7201 Acute and subacute hepatic failure with coma: Secondary | ICD-10-CM | POA: Diagnosis not present

## 2021-06-27 NOTE — Progress Notes (Signed)
Physical Therapy Treatment Patient Details Name: Dylan Weaver MRN: [id number] DOB: 1966/08/20 Today's Date: 06/27/2021   History of Present Illness Pt is a 55 y.o. male admitted 06/04/21 with weakness, jaundice. Found to have ARF with hepatic encephalopathy, possible GIB. CXR with possible L atelectasis. S/p colonoscopy 9/9 which showed multiple polyps, mild diverticulosis. Pt now medically stable for d/c and awaiting safe d/c plan (pt difficult to place due to insurance). PMH includes ETOH abuse.    PT Comments    Pt making gradual progress today; however, still with very poor safety awareness, problem solving, and balance making it unsafe for him to live alone.  He ambulated multiple bouts in room with chair follow but fatigued easily.  Requiring assist for safety and repeated cues for safe transfer techniques. Noted pt is difficult to place due to insurance - increased PT frequency, but Continue to recommend SNF at this time.    Recommendations for follow up therapy are one component of a multi-disciplinary discharge planning process, led by the attending physician.  Recommendations may be updated based on patient status, additional functional criteria and insurance authorization.  Follow Up Recommendations  SNF;Supervision/Assistance - 24 hour     Equipment Recommendations  Rolling walker with 5" wheels;3in1 (PT)    Recommendations for Other Services       Precautions / Restrictions Precautions Precautions: Fall     Mobility  Bed Mobility Overal bed mobility: Needs Assistance Bed Mobility: Supine to Sit;Sit to Supine     Supine to sit: Supervision Sit to supine: Supervision   General bed mobility comments: Completes quickly and impulsively - cues for controlled speed    Transfers Overall transfer level: Needs assistance Equipment used: Rolling walker (2 wheeled) Transfers: Sit to/from Stand Sit to Stand: Min guard         General transfer comment:  Performed sit to stand x 5 during session.  First sit to stand  was impulsive with heavy use of momentum.  Educated on safe technique with hand placement on bed and improved on next 4 stands.  Pt then tending to sit too early and plop down.  Cued to get legs back to chair and reach back to lower - improved on last trial  Ambulation/Gait Ambulation/Gait assistance: Min assist Gait Distance (Feet): 15 Feet (3', 6', 15'x2) Assistive device: Rolling walker (2 wheeled) Gait Pattern/deviations: Step-through pattern;Narrow base of support;Scissoring Gait velocity: decr   General Gait Details: Ambulated 4 bouts with seated rest breaks; provided chair follow due to hx of falls and to improve pt's confidence; Cues for posture and increased BOS with min A to steady and assist with RW   Stairs             Wheelchair Mobility    Modified Rankin (Stroke Patients Only)       Balance Overall balance assessment: Needs assistance Sitting-balance support: No upper extremity supported;Feet supported Sitting balance-Leahy Scale: Good     Standing balance support: Bilateral upper extremity supported Standing balance-Leahy Scale: Poor Standing balance comment: reliant on  UE support and min A with gait                            Cognition Arousal/Alertness: Awake/alert Behavior During Therapy: Impulsive Overall Cognitive Status: No family/caregiver present to determine baseline cognitive functioning Area of Impairment: Problem solving;Safety/judgement;Attention;Memory;Following commands;Awareness                   Current Attention  Level: Sustained Memory: Decreased short-term memory;Decreased recall of precautions Following Commands: Follows one step commands consistently Safety/Judgement: Decreased awareness of safety;Decreased awareness of deficits Awareness: Emergent Problem Solving: Difficulty sequencing;Requires verbal cues;Requires tactile cues General Comments:  Pt impulsive with transfers; required frequent cues for safe transfer techniques; difficulty with problem solving      Exercises      General Comments        Pertinent Vitals/Pain Pain Assessment: No/denies pain    Home Living                      Prior Function            PT Goals (current goals can now be found in the care plan section) Progress towards PT goals: Progressing toward goals    Frequency    Min 3X/week      PT Plan Frequency needs to be updated    Co-evaluation              AM-PAC PT "6 Clicks" Mobility   Outcome Measure  Help needed turning from your back to your side while in a flat bed without using bedrails?: None Help needed moving from lying on your back to sitting on the side of a flat bed without using bedrails?: A Little Help needed moving to and from a bed to a chair (including a wheelchair)?: A Little Help needed standing up from a chair using your arms (e.g., wheelchair or bedside chair)?: A Little Help needed to walk in hospital room?: A Little Help needed climbing 3-5 steps with a railing? : A Lot 6 Click Score: 18    End of Session Equipment Utilized During Treatment: Gait belt Activity Tolerance: Patient tolerated treatment well Patient left: in bed;with call bell/phone within reach;with bed alarm set Nurse Communication: Mobility status PT Visit Diagnosis: Muscle weakness (generalized) (M62.81);Unsteadiness on feet (R26.81);Difficulty in walking, not elsewhere classified (R26.2)     Time: 9417-4081 PT Time Calculation (min) (ACUTE ONLY): 20 min  Charges:  $Gait Training: 8-22 mins                     Dylan Weaver, PT Acute Rehab Services Pager [phone] [facility] [phone]    Dylan Weaver 06/27/2021, 3:45 PM

## 2021-06-27 NOTE — Progress Notes (Signed)
PROGRESS NOTE  Dylan Weaver Cadmus XLK:[id number] DOB: 1966-01-30 DOA: 06/04/2021 P[facility]: [name]  HPI/Recap of past 24 hours: 55 y.o. male severe alcohol abuse who presents to the ED for weakness and jaundice. On admission history was limited due to his encephalopathy and was found to be in acute liver failure related to alcohol abuse. Pt was started on IV fluids, antibiotics, PPI, octreotide (which have all being d/ced except for famotidine). Hospital course complicated by drop in hemoglobin to 5.4, FOBT was positive, required about 2U of PRBC. GI was consulted for further management.  Patient currently stable and awaiting SNF placement, still no bed offers. Difficult to place due to his [employer] and limited access.  9/23: Had a fall earlier today, no injuries.  Per patient he was trying to go to bedside commode, stating that he learned his lesson and will not try that again.  Subjective. Patient has no new complaint today.  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 MDF on admission was 7.6, was not a candidate for steroids Repeat ammonia level on 06/16/21 WNL Patient received prophylaxis antibiotics for GI bleeding for a total of 7 days, completed Continue midodrine  Fall.  No acute injuries.  Patient understand the consequences and stating that he will not try again. -Keep the bed at lowest level. -Continue to  monitor   Possible GI bleed Hemoglobin with mild drop, but has been stable On admission hemoglobin was 5.4, transfused 2 unit of packed red blood cells GI consulted, EGD as above Colonoscopy was done that showed on 06/10/2021 that showed multiple polyps mild diverticulosis nonbleeding internal hemorrhoids Received IV Protonix and octreotide, now transition to p.o. 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 Fluid restriction for now Daily BMP  Hypotension Stable- baseline soft Noted some episodes, as low as SBP in the 80s, appears asymptomatic LA mildly elevated, no need to trend Monitor closely  Acute kidney injury Hepatorenal syndrome Resolved, creatinine back to baseline, on admission 4.4  Alcohol abuse/dependence Advised to quit Continue MVT, thiamine, folic acid S/P CIWA protocol  Hypomagnesemia/hypokalemia Replace prn It was discussed with renal and they recommended to stop Protonix and start him on a H2 blocker famotidine  Chronic thrombocytopenia Likely 2/2 liver failure Monitor closely  Weakness/deconditioning Physical therapy evaluated the patient and recommended skilled nursing facility. Still no bed offer-TOC is working on it  Malnutrition Type:  Nutrition Problem: Severe Malnutrition Etiology: social / environmental circumstances   Malnutrition Characteristics:  Signs/Symptoms: moderate fat depletion, severe muscle depletion, energy intake < 75% for > or equal to 3 months   Nutrition Interventions:  Interventions: MVI    Estimated body mass index is 20.51 kg/m as calculated from the following:   Height as of this encounter: 6' (1.829 m).   Weight as of this encounter: 68.6 kg.     Code  Status:  Full  Family Communication: None at bedside  Disposition Plan: Status is: Inpatient  Remains inpatient appropriate because:Inpatient level of care appropriate due to severity of illness  Dispo: The patient is from: Home              Anticipated d/c is to: SNF              Patient currently is medically stable.   Difficult to place patient yes  Consultants: GI  Procedures: EGD Colonoscopy  Antimicrobials: None  DVT prophylaxis: SCD   Objective: Vitals:   06/26/21 2314 06/27/21 0341 06/27/21 0810 06/27/21 1147  BP: 100/74 93/74 112/70 107/77  Pulse: 66 71 69 77  Resp: 19 18 16 18   Temp: (!) 97.4 F (36.3 C) 97.6 F (36.4 C) 97.9 F (36.6 C) 98.2 F (36.8 C)  TempSrc: Oral Oral Oral Oral  SpO2: 100% 99% 100% 98%  Weight:      Height:        Intake/Output Summary (Last 24 hours) at 06/27/2021 1558 Last data filed at 06/27/2021 1300 Gross per 24 hour  Intake 234 ml  Output 850 ml  Net -616 ml    Filed Weights   06/24/21 0324 06/25/21 0500 06/26/21 0600  Weight: 67.2 kg 68.4 kg 68.6 kg    Exam: General.  Malnourished gentleman, in no acute distress. Pulmonary.  Lungs clear bilaterally, normal respiratory effort. CV.  Regular rate and rhythm, no JVD, rub or murmur. Abdomen.  Soft, nontender, nondistended, BS positive. CNS.  Alert and oriented.  No focal neurologic deficit. Extremities.  No edema, no cyanosis, pulses intact and symmetrical. Psychiatry.  Judgment and insight appears normal.   Data Reviewed: CBC: Recent Labs  Lab 06/22/21 0542 06/23/21 0525  WBC 8.3 9.1  NEUTROABS 5.1  --   HGB 9.2* 9.8*  HCT 28.9* 30.7*  MCV 102.5* 103.4*  PLT 337 303    Basic Metabolic Panel: Recent Labs  Lab 06/21/21 0350 06/22/21 0542 06/23/21 0525  NA 128* 131* 130*  K 3.7 3.6 3.7  CL 98 99 98  CO2 20* 22 22  GLUCOSE 83 85 90  BUN 7 7 9   CREATININE 0.46* 0.56* 0.52*  CALCIUM 9.0 9.0 9.2  MG 1.7 1.5*  --     GFR: Estimated Creatinine Clearance:  101.2 mL/min (A) (by C-G formula based on SCr of 0.52 mg/dL (L)). Liver Function Tests: Recent Labs  Lab 06/23/21 0525  AST 152*  ALT 108*  ALKPHOS 217*  BILITOT 9.4*  PROT 7.8  ALBUMIN 2.0*     No results for input(s): LIPASE, AMYLASE in the last 168 hours. No results for input(s): AMMONIA in the last 168 hours.  Coagulation Profile: Recent Labs  Lab 06/23/21 0525  INR 1.1     Cardiac Enzymes: No results for input(s): CKTOTAL, CKMB, CKMBINDEX, TROPONINI in the last 168 hours. BNP (last 3 results) No results for input(s): PROBNP in the last 8760 hours. HbA1C: No results for input(s): HGBA1C in the last 72 hours. CBG: No results for input(s): GLUCAP in the last 168 hours.  Lipid Profile: No results for input(s): CHOL, HDL, LDLCALC, TRIG, CHOLHDL, LDLDIRECT in the last 72 hours. Thyroid Function Tests: No results for input(s): TSH, T4TOTAL, FREET4, T3FREE, THYROIDAB in the last 72 hours.  Anemia Panel: No results for input(s): VITAMINB12, FOLATE, FERRITIN, TIBC, IRON, RETICCTPCT in the last 72 hours.  Urine analysis:    Component Value Date/Time   COLORURINE YELLOW 01/24/2021 1601   APPEARANCEUR  CLEAR 01/24/2021 1601   LABSPEC 1.009 01/24/2021 1601   PHURINE 6.0 01/24/2021 1601   GLUCOSEU NEGATIVE 01/24/2021 1601   HGBUR NEGATIVE 01/24/2021 1601   BILIRUBINUR NEGATIVE 01/24/2021 1601   KETONESUR NEGATIVE 01/24/2021 1601   PROTEINUR NEGATIVE 01/24/2021 1601   NITRITE NEGATIVE 01/24/2021 1601   LEUKOCYTESUR NEGATIVE 01/24/2021 1601   Sepsis Labs: @LABRCNTIP (procalcitonin:4,lacticidven:4)  )No results found for this or any previous visit (from the past 240 hour(s)).    Studies: No results found.  Scheduled Meds:  (feeding supplement) PROSource Plus  30 mL Oral BID BM   famotidine  40 mg Oral BID   folic acid  1 mg Oral Daily   gabapentin  100 mg Oral TID   magnesium oxide  800 mg Oral BID   midodrine  2.5 mg Oral TID WC   multivitamin with minerals   1 tablet Oral Daily   sodium chloride flush  10-40 mL Intracatheter Q12H   sodium chloride flush  3 mL Intravenous Q12H   thiamine  100 mg Oral Daily   vitamin B-12  1,000 mcg Oral Daily    Continuous Infusions:   LOS: 23 days    , MD [facility]  If 7PM-7AM, please contact night-coverage [email or web address] 06/27/2021, 3:58 PM

## 2021-06-27 NOTE — Progress Notes (Signed)
Nutrition Follow-up  DOCUMENTATION CODES:   Severe malnutrition in context of social or environmental circumstances  INTERVENTION:   - Continue MVI w/ minerals  - Provide double protein on for all meals.  -ProSource Plus BID, provides 100 kcal and 15 grams of protein each  NUTRITION DIAGNOSIS:   Severe Malnutrition related to social / environmental circumstances as evidenced by moderate fat depletion, severe muscle depletion, energy intake < 75% for > or equal to 3 months.   Ongoing  GOAL:   Patient will meet greater than or equal to 90% of their needs  Progressing  MONITOR:   PO intake, Supplement acceptance, Weight trends, Labs, I & O's, Skin  REASON FOR ASSESSMENT:   Consult Assessment of nutrition requirement/status, Poor PO  ASSESSMENT:   55 yo male with a PMH of EtOH abuse who presents with acute liver failure with hepatic coma (encephalopathy). Came in with weakness and jaundice.  Appetite remains stable. Last eight meal completions charted as 100%. Taking ProSource BID. Upset he is on a 1.2 L fluid restriction. Discussed with MD, okay to liberalize fluid restriction.   Admission weight: 71.5 kg  Current weight: 68.6 kg   UOP: 875 mlx 24 hrs   Medications: mag-ox, vitamin B12, thiamine Labs: Na 130 (L) Mg 1.5 (L)  Diet Order:   Diet Order             Diet regular Room service appropriate? Yes with Assist; Fluid consistency: Thin; Fluid restriction: 1800 mL Fluid  Diet effective now           Diet - low sodium heart healthy                   EDUCATION NEEDS:   Education needs have been addressed  Skin:  Skin Assessment:  Skin Integrity Issues: Other: Several skin tears, ulcer on back, and wound on groin. Skin jaundice in color  Last BM:  9/25  Height:   Ht Readings from Last 1 Encounters:  06/09/21 6' (1.829 m)    Weight:   Wt Readings from Last 1 Encounters:  06/26/21 68.6 kg    Ideal Body Weight:  80.9 kg  BMI:  Body  mass index is 20.51 kg/m.  Estimated Nutritional Needs:   Kcal:  2200-2400  Protein:  100-115 grams  Fluid:  >2.2 L  Vanessa Kick MS, RD, LDN, CNSC Clinical Nutrition Pager listed in AMION

## 2021-06-28 DIAGNOSIS — K7201 Acute and subacute hepatic failure with coma: Secondary | ICD-10-CM | POA: Diagnosis not present

## 2021-06-28 NOTE — TOC Progression Note (Signed)
Transition of Care [facility]) - Progression Note    Patient Details  Name: Dylan Weaver MRN: [id number] Date of Birth: 10-13-1965  Transition of Care [facility]) CM/SW Contact  Eduard Roux, [place] Phone Number: 06/28/2021, 4:12 PM  Clinical Narrative:    Patient have no bed offers.  CSW spoke with Eber Jones -Admissions Director of [facility]- states they are not admitting any patients at this time.   CSW will continue to follow and assist with discharge planning.  Antony Blackbird, MSW, LCSW Clinical Social Worker    Expected Discharge Plan: Skilled Nursing Facility Barriers to Discharge: Inadequate or no insurance, Continued Medical Work up, SNF Pending bed offer  Expected Discharge Plan and Services Expected Discharge Plan: Skilled Nursing Facility In-house Referral: Clinical Social Work       Expected Discharge Date: 06/14/21                                     Social Determinants of Health (SDOH) Interventions    Readmission Risk Interventions No flowsheet data found.

## 2021-06-28 NOTE — Progress Notes (Signed)
PROGRESS NOTE  Dylan Weaver BJY:[id number] DOB: 07/07/1966 DOA: 06/04/2021 PCP: [name]  HPI/Recap of past 24 hours: 55 y.o. male severe alcohol abuse who presents to the ED for weakness and jaundice. On admission history was limited due to his encephalopathy and was found to be in acute liver failure related to alcohol abuse. Pt was started on IV fluids, antibiotics, PPI, octreotide (which have all being d/ced except for famotidine). Hospital course complicated by drop in hemoglobin to 5.4, FOBT was positive, required about 2U of PRBC. GI was consulted for further management.  Patient currently stable and awaiting SNF placement, still no bed offers. Difficult to place due to his [employer] and limited access.  9/23: Had a fall earlier today, no injuries.  Per patient he was trying to go to bedside commode, stating that he learned his lesson and will not try that again.  Subjective. Patient was seen and examined today.  Sitting comfortably in chair.  No new complaints.  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 MDF on admission was 7.6, was not a candidate for steroids Repeat ammonia level on 06/16/21 WNL Patient received prophylaxis antibiotics for GI bleeding for a total of 7 days, completed Continue midodrine  Fall.  No acute injuries.  Patient understand the consequences and stating that he will not try  again. -Keep the bed at lowest level. -Continue to monitor   Possible GI bleed Hemoglobin with mild drop, but has been stable On admission hemoglobin was 5.4, transfused 2 unit of packed red blood cells GI consulted, EGD as above Colonoscopy was done that showed on 06/10/2021 that showed multiple polyps mild diverticulosis nonbleeding internal hemorrhoids Received IV Protonix and octreotide, now transition to p.o. 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 Fluid restriction for now Daily BMP  Hypotension Stable- baseline soft Noted some episodes, as low as SBP in the 80s, appears asymptomatic LA mildly elevated, no need to trend Monitor closely  Acute kidney injury Hepatorenal syndrome Resolved, creatinine back to baseline, on admission 4.4  Alcohol abuse/dependence Advised to quit Continue MVT, thiamine, folic acid S/P CIWA protocol  Hypomagnesemia/hypokalemia Replace prn It was discussed with renal and they recommended to stop Protonix and start him on a H2 blocker famotidine  Chronic thrombocytopenia Likely 2/2 liver failure Monitor closely  Weakness/deconditioning Physical therapy evaluated the patient and recommended skilled nursing facility. Still no bed offer-TOC is working on it  Malnutrition Type:  Nutrition Problem: Severe Malnutrition Etiology: social / environmental circumstances   Malnutrition Characteristics:  Signs/Symptoms: moderate fat depletion, severe muscle depletion, energy intake < 75% for > or equal to 3 months   Nutrition Interventions:  Interventions: MVI    Estimated body mass index is 20.18 kg/m as calculated from the following:   Height as of this encounter: 6' (1.829 m).   Weight as of  this encounter: 67.5 kg.     Code Status: Full  Family Communication: None at bedside  Disposition Plan: Status is: Inpatient  Remains inpatient appropriate because:Inpatient level of care appropriate due to severity of illness  Dispo: The patient is from: Home              Anticipated d/c is to: SNF              Patient currently is medically stable.   Difficult to place patient yes  Consultants: GI  Procedures: EGD Colonoscopy  Antimicrobials: None  DVT prophylaxis: SCD   Objective: Vitals:   06/28/21 0338 06/28/21 0733 06/28/21 1150 06/28/21 1638  BP: 93/71 (!) 83/58 104/70 (!) 99/59  Pulse: 75 79 72 72  Resp:  19 19 20   Temp: 97.6 F (36.4 C) 97.6 F (36.4 C) 97.6 F (36.4 C) (!) 97.5 F (36.4 C)  TempSrc: Oral Oral Oral Oral  SpO2: 100% 100% 100% 99%  Weight: 67.5 kg     Height:        Intake/Output Summary (Last 24 hours) at 06/28/2021 1706 Last data filed at 06/27/2021 2230 Gross per 24 hour  Intake 100 ml  Output 150 ml  Net -50 ml    Filed Weights   06/25/21 0500 06/26/21 0600 06/28/21 0338  Weight: 68.4 kg 68.6 kg 67.5 kg    Exam: General.  No n no acute distress. Pulmonary.  Lungs clear bilaterally, normal respiratory effort. CV.  Regular rate and rhythm, no JVD, rub or murmur. Abdomen.  Soft, nontender, nondistended, BS positive. CNS.  Alert and oriented .  No focal neurologic deficit. Extremities.  No edema, no cyanosis, pulses intact and symmetrical. Psychiatry.  Judgment and insight appears normal.   Data Reviewed: CBC: Recent Labs  Lab 06/22/21 0542 06/23/21 0525  WBC 8.3 9.1  NEUTROABS 5.1  --   HGB 9.2* 9.8*  HCT 28.9* 30.7*  MCV 102.5* 103.4*  PLT 337 303    Basic Metabolic Panel: Recent Labs  Lab 06/22/21 0542 06/23/21 0525  NA 131* 130*  K 3.6 3.7  CL 99 98  CO2 22 22  GLUCOSE 85 90  BUN 7 9  CREATININE 0.56* 0.52*  CALCIUM 9.0 9.2  MG 1.5*  --     GFR: Estimated Creatinine Clearance: 99.6 mL/min  (A) (by C-G formula based on SCr of 0.52 mg/dL (L)). Liver Function Tests: Recent Labs  Lab 06/23/21 0525  AST 152*  ALT 108*  ALKPHOS 217*  BILITOT 9.4*  PROT 7.8  ALBUMIN 2.0*     No results for input(s): LIPASE, AMYLASE in the last 168 hours. No results for input(s): AMMONIA in the last 168 hours.  Coagulation Profile: Recent Labs  Lab 06/23/21 0525  INR 1.1     Cardiac Enzymes: No results for input(s): CKTOTAL, CKMB, CKMBINDEX, TROPONINI in the last 168 hours. BNP (last 3 results) No results for input(s): PROBNP in the last 8760 hours. HbA1C: No results for input(s): HGBA1C in the last 72 hours. CBG: No results for input(s): GLUCAP in the last 168 hours.  Lipid Profile: No results for input(s): CHOL, HDL, LDLCALC, TRIG, CHOLHDL, LDLDIRECT in the last 72 hours. Thyroid Function Tests: No results for input(s): TSH, T4TOTAL, FREET4, T3FREE, THYROIDAB in the last 72 hours.  Anemia Panel: No results for input(s): VITAMINB12, FOLATE, FERRITIN, TIBC, IRON, RETICCTPCT in the last 72 hours.  Urine analysis:    Component Value Date/Time   COLORURINE YELLOW 01/24/2021 1601   APPEARANCEUR  CLEAR 01/24/2021 1601   LABSPEC 1.009 01/24/2021 1601   PHURINE 6.0 01/24/2021 1601   GLUCOSEU NEGATIVE 01/24/2021 1601   HGBUR NEGATIVE 01/24/2021 1601   BILIRUBINUR NEGATIVE 01/24/2021 1601   KETONESUR NEGATIVE 01/24/2021 1601   PROTEINUR NEGATIVE 01/24/2021 1601   NITRITE NEGATIVE 01/24/2021 1601   LEUKOCYTESUR NEGATIVE 01/24/2021 1601   Sepsis Labs: @LABRCNTIP (procalcitonin:4,lacticidven:4)  )No results found for this or any previous visit (from the past 240 hour(s)).    Studies: No results found.  Scheduled Meds:  (feeding supplement) PROSource Plus  30 mL Oral BID BM   famotidine  40 mg Oral BID   folic acid  1 mg Oral Daily   gabapentin  100 mg Oral TID   magnesium oxide  800 mg Oral BID   midodrine  2.5 mg Oral TID WC   multivitamin with minerals  1 tablet  Oral Daily   sodium chloride flush  10-40 mL Intracatheter Q12H   sodium chloride flush  3 mL Intravenous Q12H   thiamine  100 mg Oral Daily   vitamin B-12  1,000 mcg Oral Daily    Continuous Infusions:   LOS: 24 days    , MD [facility]  If 7PM-7AM, please contact night-coverage [email or web address] 06/28/2021, 5:06 PM

## 2021-06-28 NOTE — Progress Notes (Addendum)
Mobility Specialist Progress Note    06/28/21 1431  Mobility  Activity Ambulated in room  Level of Assistance Contact guard assist, steadying assist  Assistive Device Front wheel walker  Distance Ambulated (ft) 44 ft  Mobility Ambulated with assistance in room  Mobility Response Tolerated well  Mobility performed by Mobility specialist  $Mobility charge 1 Mobility   Pt received in chair frustrated waiting to get back to bed. Agreed to walk to door and had a quick pace. Returned to bed with call bell in reach.   [place] Mobility Specialist  Mobility Specialist Phone: [phone]

## 2021-06-28 NOTE — Progress Notes (Signed)
Occupational Therapy Treatment Patient Details Name: Dylan Weaver MRN: [id number] DOB: 1966-01-18 Today's Date: 06/28/2021   History of present illness Pt is a 55 y.o. male admitted 06/04/21 with weakness, jaundice. Found to have ARF with hepatic encephalopathy, possible GIB. CXR with possible L atelectasis. S/p colonoscopy 9/9 which showed multiple polyps, mild diverticulosis. Pt now medically stable for d/c and awaiting safe d/c plan (pt difficult to place due to insurance). PMH includes ETOH abuse.   OT comments  Pt progressing well towards OT goals, able to mobilize in room using RW with Min A (L LE noted to give out at times and hx of B foot neuropathy). Pt able to stand at sink for approx 5 min to complete grooming tasks with Min A to maintain balance due to impulsive movements at times. Pt left up in chair with alarm active and needs within reach. Pt also reports motivated to walk in hallway this afternoon. Plan to provide UE HEP in next session to maximize strength and activity outside of therapy sessions. Continue to recommend SNF rehab as pt at increased fall risk and continues to require physical assist to complete ADLs.    Recommendations for follow up therapy are one component of a multi-disciplinary discharge planning process, led by the attending physician.  Recommendations may be updated based on patient status, additional functional criteria and insurance authorization.    Follow Up Recommendations  SNF;Supervision/Assistance - 24 hour    Equipment Recommendations  Other (comment);3 in 1 bedside commode (Rolling walker)    Recommendations for Other Services      Precautions / Restrictions Precautions Precautions: Fall Precaution Comments: soft BP Restrictions Weight Bearing Restrictions: No       Mobility Bed Mobility Overal bed mobility: Needs Assistance Bed Mobility: Supine to Sit     Supine to sit: Supervision     General bed mobility comments:  Completes quickly and impulsively - cues for controlled speed    Transfers Overall transfer level: Needs assistance Equipment used: Rolling walker (2 wheeled) Transfers: Sit to/from Stand Sit to Stand: Min assist         General transfer comment: Min A for initial steadying with pt pulling on RW, min guard from recliner at end of session after cues to push from armrest and improved independence noted    Balance Overall balance assessment: Needs assistance Sitting-balance support: No upper extremity supported;Feet supported Sitting balance-Leahy Scale: Good     Standing balance support: Bilateral upper extremity supported Standing balance-Leahy Scale: Poor Standing balance comment: reliant on  UE support and external support for dynamic tasks                           ADL either performed or assessed with clinical judgement   ADL Overall ADL's : Needs assistance/impaired     Grooming: Minimal assistance;Standing;Oral care;Brushing hair Grooming Details (indicate cue type and reason): Up to Min A to maintain balance due to impulsive quick movements and assist to reach outside of BOS to grab paper towels, etc. But able to stand for duration of task                             Functional mobility during ADLs: Minimal assistance;Rolling walker;Cueing for sequencing;Cueing for safety General ADL Comments: Pt able to mobilize in room for ADLs standing at sink, still impulsive and noted L LE to give out but pt able to  self correct. Improving cognition and awareness of deficits though deficits still present.     Vision   Vision Assessment?: No apparent visual deficits   Perception     Praxis      Cognition Arousal/Alertness: Awake/alert Behavior During Therapy: WFL for tasks assessed/performed;Impulsive Overall Cognitive Status: No family/caregiver present to determine baseline cognitive functioning Area of Impairment: Problem  solving;Safety/judgement;Attention;Memory;Following commands;Awareness                   Current Attention Level: Selective Memory: Decreased short-term memory Following Commands: Follows one step commands consistently;Follows multi-step commands with increased time Safety/Judgement: Decreased awareness of safety;Decreased awareness of deficits Awareness: Emergent Problem Solving: Difficulty sequencing;Requires verbal cues;Requires tactile cues General Comments: Pt impulsive with transfers; required frequent cues for safe transfer techniques; difficulty with problem solving though improved awareness of deficits today. Reports not using BSC or going to bathroom without nursing assist        Exercises     Shoulder Instructions       General Comments      Pertinent Vitals/ Pain       Pain Assessment: No/denies pain Pain Intervention(s): Monitored during session  Home Living                                          Prior Functioning/Environment              Frequency  Min 2X/week        Progress Toward Goals  OT Goals(current goals can now be found in the care plan section)  Progress towards OT goals: Progressing toward goals  Acute Rehab OT Goals Patient Stated Goal: go for a walk in hallway this afternoon OT Goal Formulation: With patient Time For Goal Achievement: 07/06/21 Potential to Achieve Goals: Fair ADL Goals Pt Will Perform Grooming: with modified independence;standing Pt Will Perform Lower Body Dressing: with modified independence;sitting/lateral leans;sit to/from stand Pt Will Transfer to Toilet: with min guard assist;ambulating  Plan Discharge plan remains appropriate;Frequency remains appropriate    Co-evaluation                 AM-PAC OT "6 Clicks" Daily Activity     Outcome Measure   Help from another person eating meals?: None Help from another person taking care of personal grooming?: A Little Help from  another person toileting, which includes using toliet, bedpan, or urinal?: A Little Help from another person bathing (including washing, rinsing, drying)?: A Lot Help from another person to put on and taking off regular upper body clothing?: A Little Help from another person to put on and taking off regular lower body clothing?: A Lot 6 Click Score: 17    End of Session Equipment Utilized During Treatment: Gait belt;Rolling walker  OT Visit Diagnosis: Unsteadiness on feet (R26.81);Other abnormalities of gait and mobility (R26.89);Muscle weakness (generalized) (M62.81);Other symptoms and signs involving cognitive function   Activity Tolerance Patient tolerated treatment well   Patient Left in chair;with call bell/phone within reach;with chair alarm set   Nurse Communication Mobility status        Time: 7616-0737 OT Time Calculation (min): 23 min  Charges: OT General Charges $OT Visit: 1 Visit OT Treatments $Self Care/Home Management : 8-22 mins $Therapeutic Activity: 8-22 mins  Bradd Canary, OTR/L Acute Rehab Services Office: [phone]   Lorre Munroe 06/28/2021, 9:19 AM

## 2021-06-29 DIAGNOSIS — K7201 Acute and subacute hepatic failure with coma: Secondary | ICD-10-CM | POA: Diagnosis not present

## 2021-06-29 NOTE — Progress Notes (Signed)
Physical Therapy Treatment Patient Details Name: Dylan Weaver MRN: [id number] DOB: 05-Feb-1966 Today's Date: Weaver   History of Present Illness Pt is a 55 y.o. male admitted 06/04/21 with weakness, jaundice. Found to have ARF with hepatic encephalopathy, possible GIB. CXR with possible L atelectasis. S/p colonoscopy 9/9 which showed multiple polyps, mild diverticulosis. Pt now medically stable for d/c and awaiting safe d/c plan (pt difficult to place due to insurance). PMH includes ETOH abuse.    PT Comments    Pt in good spirits, eager to mobilize in hallway. Pt ambulatory 2x80 ft with use of RW and close guard for safety, pt additionally requiring safety cues throughout mobility as pt attempts to mobilize quickly and unsafely. PT to continue to follow to further address balance, strength, and endurance deficits.      Recommendations for follow up therapy are one component of a multi-disciplinary discharge planning process, led by the attending physician.  Recommendations may be updated based on patient status, additional functional criteria and insurance authorization.  Follow Up Recommendations  SNF;Supervision/Assistance - 24 hour     Equipment Recommendations  Rolling walker with 5" wheels;3in1 (PT)    Recommendations for Other Services       Precautions / Restrictions Precautions Precautions: Fall Restrictions Weight Bearing Restrictions: No     Mobility  Bed Mobility Overal bed mobility: Needs Assistance Bed Mobility: Supine to Sit;Sit to Supine     Supine to sit: Supervision Sit to supine: Supervision   General bed mobility comments: for safety, no physical assist.    Transfers Overall transfer level: Needs assistance Equipment used: Rolling walker (2 wheeled) Transfers: Sit to/from Stand Sit to Stand: Supervision         General transfer comment: for safety, steadying RW as needed. Pt raising from support surface with hands on chair vs on RW.  STS x2, from EOB and chair in hallway.  Ambulation/Gait Ambulation/Gait assistance: Min guard Gait Distance (Feet): 80 Feet (x2 - seated rest break) Assistive device: Rolling walker (2 wheeled) Gait Pattern/deviations: Step-through pattern;Narrow base of support;Decreased stride length Gait velocity: decr   General Gait Details: close guard for safety, verbal cuing for upright posture, slowing gait as pt moving quickly and uncontrolled initially. seated rest break to recover fatigue.   Stairs             Wheelchair Mobility    Modified Rankin (Stroke Patients Only)       Balance Overall balance assessment: Needs assistance Sitting-balance support: No upper extremity supported;Feet supported Sitting balance-Leahy Scale: Good     Standing balance support: Bilateral upper extremity supported Standing balance-Leahy Scale: Poor Standing balance comment: reliant on UE support and external support for dynamic tasks                            Cognition Arousal/Alertness: Awake/alert Behavior During Therapy: WFL for tasks assessed/performed;Impulsive Overall Cognitive Status: No family/caregiver present to determine baseline cognitive functioning Area of Impairment: Problem solving;Safety/judgement;Attention;Following commands;Awareness                   Current Attention Level: Selective   Following Commands: Follows one step commands consistently Safety/Judgement: Decreased awareness of safety;Decreased awareness of deficits Awareness: Emergent Problem Solving: Difficulty sequencing;Requires verbal cues;Requires tactile cues General Comments: Can be impulsive, requires cues to slow down. Pt states today "this is all my fault, but I am going to be better" referring to ETOH use.  Exercises      General Comments        Pertinent Vitals/Pain Pain Assessment: No/denies pain Faces Pain Scale: No hurt Pain Intervention(s): Monitored during  session    Home Living                      Prior Function            PT Goals (current goals can now be found in the care plan section) Acute Rehab PT Goals Patient Stated Goal: to wait until monday to walk PT Goal Formulation: With patient Time For Goal Achievement: 06/20/21 Potential to Achieve Goals: Good Progress towards PT goals: Progressing toward goals    Frequency    Min 3X/week      PT Plan Current plan remains appropriate    Co-evaluation              AM-PAC PT "6 Clicks" Mobility   Outcome Measure  Help needed turning from your back to your side while in a flat bed without using bedrails?: None Help needed moving from lying on your back to sitting on the side of a flat bed without using bedrails?: A Little Help needed moving to and from a bed to a chair (including a wheelchair)?: A Little Help needed standing up from a chair using your arms (Weaver.g., wheelchair or bedside chair)?: A Little Help needed to walk in hospital room?: A Little Help needed climbing 3-5 steps with a railing? : A Lot 6 Click Score: 18    End of Session   Activity Tolerance: Patient tolerated treatment well Patient left: in bed;with call bell/phone within reach;with bed alarm set Nurse Communication: Mobility status PT Visit Diagnosis: Muscle weakness (generalized) (M62.81);Difficulty in walking, not elsewhere classified (R26.2)     Time: 9562-1308 PT Time Calculation (min) (ACUTE ONLY): 16 min  Charges:  $Gait Training: 8-22 mins                     Dylan Weaver, PT DPT Acute Rehabilitation Services Pager [phone]  Office [phone]    Dylan Weaver Dylan Weaver, 11:23 AM

## 2021-06-29 NOTE — Progress Notes (Signed)
Mobility Specialist Progress Note   06/29/21 1501  Mobility  Activity Ambulated in hall  Level of Assistance Contact guard assist, steadying assist  Assistive Device Front wheel walker  Distance Ambulated (ft) 420 ft  Mobility Ambulated with assistance in hallway  Mobility Response Tolerated well  Mobility performed by Mobility specialist  $Mobility charge 1 Mobility   Received pt in bed c/o no symptoms. Agreeable to mobility session. Gait a lot more steady compared to yest. mobility session, x1 seated break. Pt returned back to bed with call bell by side, bed alarm on and all request made.  Frederico Hamman Mobility Specialist Phone Number [phone]

## 2021-06-29 NOTE — Progress Notes (Signed)
Pt refused sacral foam dressing placement.  Brooke Pace, RN

## 2021-06-29 NOTE — Progress Notes (Signed)
PROGRESS NOTE    Dylan Weaver  FIE:[id number] DOB: 10/24/1965 DOA: 06/04/2021 PCP: [name]   Brief Narrative:  This 55 y.o. male with severe alcohol abuse who presents to the ED for weakness and jaundice. On admission history was limited due to his encephalopathy and was found to be in acute liver failure related to alcohol abuse. Pt was started on IV fluids, antibiotics, PPI, octreotide (which have all being d/ced except for famotidine). Hospital course complicated by drop in hemoglobin to 5.4, FOBT was positive, required about 2U of PRBC. GI was consulted for further management.  Patient underwent EGD and colonoscopy.  Patient currently stable and awaiting SNF placement, still no bed offers. Difficult to place due to his [employer] and limited access.    Assessment & Plan:   Principal Problem:   Acute liver failure with hepatic coma (HCC) Active Problems:   Alcohol use disorder, severe, dependence (HCC)   Thrombocytopenia (HCC)   Elevated LFTs   Acute renal failure (ARF) (HCC)   Protein-calorie malnutrition, severe   Acute liver failure with hepatic coma / Hepatic encephalopathy: Patient presented with intermittent confusion, abnormal LFTs. GI was consulted, He underwent EGD on 06/08/2021 showed esophagitis with no signs of bleeding, portal hypertensive gastropathy and gastritis His meld score was 33 on admission, liver functions now improving. MDF on admission was 7.6, was not  candidate for steroids Repeat ammonia level on 06/16/21 WNL Patient has received prophylaxis antibiotics for GI bleeding for a total of 7 days, completed Continue midodrine   Recurrent falls: Patient has fallen on 9/27.  no acute injuries.   Patient understand the consequences and stating that he will not try again. Keep the bed at lowest level. Continue to monitor.  Possible GI bleed : Hemoglobin has been stable. On admission hemoglobin was 5.4, transfused 2 unit of packed  red blood cells GI consulted, EGD showed esophagitis with no signs of bleeding Colonoscopy on 06/10/2021 that showed multiple polyps, mild diverticulosis, nonbleeding internal hemorrhoids Received IV Protonix and octreotide, now transitioned to p.o. 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 Continue fluid restriction. Sodium is improving.   Hypotension Stable- baseline soft Noted some episodes, as low as SBP in the 80s, appears asymptomatic LA mildly elevated, no need to trend Monitor closely  Acute kidney injury > resolved. Hepatorenal syndrome Resolved, creatinine back to baseline, on admission 4.4  Alcohol abuse/dependence. Patient was counseled about quit alcohol. Continue MVT, thiamine, folic acid S/P CIWA protocol  Hypomagnesemia/ Hypokalemia: Replaced and resolved.  Chronic thrombocytopenia Likely 2/2 liver failure. Monitor closely  Weakness/deconditioning Physical therapy evaluated the patient and recommended skilled nursing facility. Still no bed offer-TOC is working on it   Malnutrition Type: Nutrition Problem: Severe Malnutrition Etiology: social / environmental circumstances     DVT prophylaxis: SCDs Code Status: Full code Family Communication: No family at bedside Disposition Plan:   Status is: Inpatient  Remains inpatient appropriate because:Inpatient level of care appropriate due to severity of illness  Dispo: The patient is from: Home              Anticipated d/c is to: SNF              Patient currently is not medically stable to d/c.   Difficult to place patient No  Consultants:  GI  Procedures: EGD and colonoscopy  Antimicrobials:  none  Subjective: Patient was seen and examined at  bedside.  Overnight events noted.   Patient reports having neuropathy,  denies any other pain.  His eyes are still yellow.    Objective: Vitals:   06/28/21 2327 06/29/21 0332 06/29/21 0823 06/29/21 1158  BP: 128/67 92/66 (!) 92/56 124/76  Pulse: 69 68 62 75  Resp: 20  20 20   Temp: 97.6 F (36.4 C) 97.8 F (36.6 C) 99.2 F (37.3 C) 98 F (36.7 C)  TempSrc: Oral Oral Oral Oral  SpO2: 99% 99% 99% 99%  Weight:  68.4 kg    Height:        Intake/Output Summary (Last 24 hours) at 06/29/2021 1511 Last data filed at 06/29/2021 1339 Gross per 24 hour  Intake 440 ml  Output 450 ml  Net -10 ml   Filed Weights   06/26/21 0600 06/28/21 0338 06/29/21 0332  Weight: 68.6 kg 67.5 kg 68.4 kg    Examination:  General exam: Appears calm and comfortable, not in any acute distress. His eyes are yellow. Respiratory system: Clear to auscultation. Respiratory effort normal. Cardiovascular system: S1-S2 heard, regular rate and rhythm, no murmur. Gastrointestinal system: Abdomen is soft, nontender, mildly distended, BS +. Central nervous system: Alert and oriented. No focal neurological deficits. Extremities: No edema, no cyanosis, no clubbing. Skin: No rashes, lesions or ulcers Psychiatry: Judgement and insight appear normal. Mood & affect appropriate.     Data Reviewed: I have personally reviewed following labs and imaging studies  CBC: Recent Labs  Lab 06/23/21 0525  WBC 9.1  HGB 9.8*  HCT 30.7*  MCV 103.4*  PLT 303   Basic Metabolic Panel: Recent Labs  Lab 06/23/21 0525  NA 130*  K 3.7  CL 98  CO2 22  GLUCOSE 90  BUN 9  CREATININE 0.52*  CALCIUM 9.2   GFR: Estimated Creatinine Clearance: 100.9 mL/min (A) (by C-G formula based on SCr of 0.52 mg/dL (L)). Liver Function Tests: Recent Labs  Lab 06/23/21 0525  AST 152*  ALT 108*  ALKPHOS 217*  BILITOT 9.4*  PROT 7.8  ALBUMIN 2.0*   No results for input(s): LIPASE, AMYLASE in the last 168 hours. No results for  input(s): AMMONIA in the last 168 hours. Coagulation Profile: Recent Labs  Lab 06/23/21 0525  INR 1.1   Cardiac Enzymes: No results for input(s): CKTOTAL, CKMB, CKMBINDEX, TROPONINI in the last 168 hours. BNP (last 3 results) No results for input(s): PROBNP in the last 8760 hours. HbA1C: No results for input(s): HGBA1C in the last 72 hours. CBG: No results for input(s): GLUCAP in the last 168 hours. Lipid Profile: No results for input(s): CHOL, HDL, LDLCALC, TRIG, CHOLHDL, LDLDIRECT in the last 72 hours. Thyroid Function Tests: No results for input(s): TSH, T4TOTAL, FREET4, T3FREE, THYROIDAB in the last 72 hours. Anemia Panel: No results for input(s): VITAMINB12, FOLATE, FERRITIN, TIBC, IRON, RETICCTPCT in the last 72 hours. Sepsis Labs: No results for input(s): PROCALCITON, LATICACIDVEN in the last 168 hours.  No results found for this or any previous visit (from the past 240 hour(s)).   Radiology Studies: No results found.   Scheduled Meds:  (feeding supplement) PROSource Plus  30 mL Oral BID BM   famotidine  40 mg Oral BID   folic acid  1 mg Oral Daily   gabapentin  100 mg Oral TID   magnesium oxide  800 mg Oral BID   midodrine  2.5 mg Oral TID WC   multivitamin with minerals  1 tablet Oral Daily   sodium chloride flush  10-40 mL Intracatheter Q12H   sodium chloride flush  3 mL Intravenous Q12H   thiamine  100 mg Oral Daily   vitamin B-12  1,000 mcg Oral Daily   Continuous Infusions:   LOS: 25 days    Time spent: 35 MINS    Dylan Lienhard, MD [facility]   If 7PM-7AM, please contact night-coverage

## 2021-06-30 DIAGNOSIS — K7201 Acute and subacute hepatic failure with coma: Secondary | ICD-10-CM | POA: Diagnosis not present

## 2021-06-30 LAB — COMPREHENSIVE METABOLIC PANEL
ALT: 116 U/L — ABNORMAL HIGH (ref 0–44)
AST: 130 U/L — ABNORMAL HIGH (ref 15–41)
Albumin: 2.4 g/dL — ABNORMAL LOW (ref 3.5–5.0)
Alkaline Phosphatase: 180 U/L — ABNORMAL HIGH (ref 38–126)
Anion gap: 9 (ref 5–15)
BUN: 10 mg/dL (ref 6–20)
CO2: 22 mmol/L (ref 22–32)
Calcium: 9.3 mg/dL (ref 8.9–10.3)
Chloride: 102 mmol/L (ref 98–111)
Creatinine, Ser: 0.56 mg/dL — ABNORMAL LOW (ref 0.61–1.24)
GFR, Estimated: >60 mL/min (ref >60)
Glucose, Bld: 99 mg/dL (ref 70–99)
Potassium: 4 mmol/L (ref 3.5–5.1)
Sodium: 133 mmol/L — ABNORMAL LOW (ref 135–145)
Total Bilirubin: 4.9 mg/dL — ABNORMAL HIGH (ref 0.3–1.2)
Total Protein: 7.4 g/dL (ref 6.5–8.1)

## 2021-06-30 LAB — CBC
HCT: 33.2 % — ABNORMAL LOW (ref 39.0–52.0)
Hemoglobin: 10.5 g/dL — ABNORMAL LOW (ref 13.0–17.0)
MCH: 32.5 pg (ref 26.0–34.0)
MCHC: 31.6 g/dL (ref 30.0–36.0)
MCV: 102.8 fL — ABNORMAL HIGH (ref 80.0–100.0)
Platelets: 258 10*3/uL (ref 150–400)
RBC: 3.23 MIL/uL — ABNORMAL LOW (ref 4.22–5.81)
RDW: 14.7 % (ref 11.5–15.5)
WBC: 9.7 10*3/uL (ref 4.0–10.5)
nRBC: 0 % (ref 0.0–0.2)

## 2021-06-30 LAB — PHOSPHORUS: Phosphorus: 4.5 mg/dL (ref 2.5–4.6)

## 2021-06-30 LAB — MAGNESIUM: Magnesium: 1.5 mg/dL — ABNORMAL LOW (ref 1.7–2.4)

## 2021-06-30 MED ORDER — MAGNESIUM SULFATE 2 GM/50ML IV SOLN
2.0000 g | Freq: Once | INTRAVENOUS | Status: AC
  Administered 2021-06-30: 2 g via INTRAVENOUS
  Filled 2021-06-30: qty 50

## 2021-06-30 NOTE — Progress Notes (Signed)
Mobility Specialist Progress Note   06/30/21 1425  Mobility  Activity Ambulated in hall  Level of Assistance Minimal assist, patient does 75% or more  Assistive Device  (Hallway Railing)  Distance Ambulated (ft) 350 ft ((240 + 50 + 60))  Mobility Ambulated with assistance in hallway  Mobility Response Tolerated well  Mobility performed by Mobility specialist  $Mobility charge 1 Mobility   Received pt laying in bed, denies any symptoms or pain. Agreeable to mobility session but hesitant about going on a walk w/o a RW, eventually agreed. x2 seated breaks d/t fatigue, x2 LOB but quickly corrected by pt. Pt returned back to EOB stating to be more confident in walking w/o RW. Call bell left by pt side and bed alarm turned on.   Frederico Hamman Mobility Specialist Phone Number [phone]

## 2021-06-30 NOTE — Plan of Care (Signed)
  Problem: Health Behavior/Discharge Planning: Goal: Ability to manage health-related needs will improve Outcome: Progressing   Problem: Clinical Measurements: Goal: Ability to maintain clinical measurements within normal limits will improve Outcome: Progressing   Problem: Clinical Measurements: Goal: Diagnostic test results will improve Outcome: Progressing   Problem: Clinical Measurements: Goal: Cardiovascular complication will be avoided Outcome: Progressing   Problem: Activity: Goal: Risk for activity intolerance will decrease Outcome: Progressing   Problem: Safety: Goal: Ability to remain free from injury will improve Outcome: Progressing

## 2021-06-30 NOTE — Progress Notes (Signed)
Occupational Therapy Treatment Patient Details Name: Dylan Weaver MRN: [id number] DOB: 03-12-1966 Today's Date: 06/30/2021   History of present illness Pt is a 55 y.o. male admitted 06/04/21 with weakness, jaundice. Found to have ARF with hepatic encephalopathy, possible GIB. CXR with possible L atelectasis. S/p colonoscopy 9/9 which showed multiple polyps, mild diverticulosis. Pt now medically stable for d/c and awaiting safe d/c plan (pt difficult to place due to insurance). PMH includes ETOH abuse.   OT comments  Pt progressing well towards OT goals with remaining deficits in cognition and standing balance that impact safety/independence with daily tasks. On entry, pt requesting bathroom assist and able to mobilize with RW at min guard with no LE buckling noted. Pt continues to have difficulty sequencing, impaired attention to tasks and general awareness with ADLs standing at sink. Educated pt on UE HEP (handout provided for improved carryover) via theraband with good carryover noted. Plan to further address LB ADLs and standing balance in next session. Continue to recommend post-acute rehab.   Recommendations for follow up therapy are one component of a multi-disciplinary discharge planning process, led by the attending physician.  Recommendations may be updated based on patient status, additional functional criteria and insurance authorization.    Follow Up Recommendations  Other (comment);Supervision/Assistance - 24 hour (post acute rehab)    Equipment Recommendations  3 in 1 bedside commode;Other (comment) (Rolling walker)    Recommendations for Other Services      Precautions / Restrictions Precautions Precautions: Fall Restrictions Weight Bearing Restrictions: No       Mobility Bed Mobility Overal bed mobility: Independent Bed Mobility: Supine to Sit Rolling: Independent         General bed mobility comments: sitting EOB on entry    Transfers Overall  transfer level: Needs assistance Equipment used: Rolling walker (2 wheeled) Transfers: Sit to/from [employer] Sit to Stand: Min guard Stand pivot transfers: Min guard       General transfer comment: min guard for standing from toilet due to posterior lean, Supervision for sit to stands from bed and chair with RW. At end of session with pt in chair,pt requesting to go back to bed - able to stand and pivot without AD and light handheld assist    Balance Overall balance assessment: Needs assistance Sitting-balance support: No upper extremity supported;Feet supported Sitting balance-Leahy Scale: Good     Standing balance support: Bilateral upper extremity supported;Single extremity supported Standing balance-Leahy Scale: Fair Standing balance comment: BUE for mobility, able to stand without support at sink and for simple transfer from chair to bed                           ADL either performed or assessed with clinical judgement   ADL Overall ADL's : Needs assistance/impaired     Grooming: Supervision/safety;Standing;Oral care Grooming Details (indicate cue type and reason): Supervision to brush teeth without UE support, no LE buckling noted. Noted to have decreased attention to task and messy - opened toothpaste had been knocked in sink though pt did not appear to notice or correct         Upper Body Dressing : Minimal assistance;Sitting Upper Body Dressing Details (indicate cue type and reason): Min A to change soiled gown, cues for sequencing and attending to task     Toilet Transfer: Min guard;Ambulation;RW;Regular Teacher, adult education Details (indicate cue type and reason): min guard for safety, pt with posterior lean when transferring  off of regular toilet. cues needed to lean forward to maintain balance           General ADL Comments: Pt with improving mobility with less LE buckling noted, still with cognitive impairments that impact problem  solving and safety awareness     Vision   Vision Assessment?: No apparent visual deficits   Perception     Praxis      Cognition Arousal/Alertness: Awake/alert Behavior During Therapy: WFL for tasks assessed/performed;Impulsive Overall Cognitive Status: No family/caregiver present to determine baseline cognitive functioning Area of Impairment: Problem solving;Safety/judgement;Attention;Following commands;Awareness;Memory                   Current Attention Level: Selective Memory: Decreased short-term memory Following Commands: Follows one step commands consistently Safety/Judgement: Decreased awareness of safety;Decreased awareness of deficits Awareness: Emergent Problem Solving: Difficulty sequencing;Requires verbal cues;Requires tactile cues General Comments: Can be impulsive, requires cues to slow down. Cues needed for safety awareness though improving (pulled call light after done with bathroom use though OT in room)        Exercises Exercises: General Upper Extremity General Exercises - Upper Extremity Shoulder Flexion: Strengthening;Both;20 reps;Seated;Theraband Theraband Level (Shoulder Flexion): Level 1 (Yellow) Shoulder Horizontal ABduction: Strengthening;Both;Seated;Theraband;20 reps Theraband Level (Shoulder Horizontal Abduction): Level 1 (Yellow) Elbow Flexion: Strengthening;Both;10 reps;Seated;Theraband Theraband Level (Elbow Flexion): Level 1 (Yellow) Elbow Extension: Strengthening;Both;10 reps;Seated;Theraband Theraband Level (Elbow Extension): Level 1 (Yellow)   Shoulder Instructions       General Comments VSS on RA. Pt forgets to continue ambulating after rest break, focused on cognitive task. Requires cues to initiate ambulation again    Pertinent Vitals/ Pain       Pain Assessment: No/denies pain Pain Intervention(s): Monitored during session  Home Living                                          Prior  Functioning/Environment              Frequency  Min 2X/week        Progress Toward Goals  OT Goals(current goals can now be found in the care plan section)  Progress towards OT goals: Progressing toward goals  Acute Rehab OT Goals Patient Stated Goal: to eventually return home OT Goal Formulation: With patient Time For Goal Achievement: 07/06/21 Potential to Achieve Goals: Fair ADL Goals Pt Will Perform Grooming: with modified independence;standing Pt Will Perform Lower Body Dressing: with modified independence;sitting/lateral leans;sit to/from stand Pt Will Transfer to Toilet: with min guard assist;ambulating Pt/caregiver will Perform Home Exercise Program: Increased strength;Both right and left upper extremity;With theraband;Independently;With written HEP provided  Plan Discharge plan remains appropriate;Frequency remains appropriate    Co-evaluation                 AM-PAC OT "6 Clicks" Daily Activity     Outcome Measure   Help from another person eating meals?: None Help from another person taking care of personal grooming?: A Little Help from another person toileting, which includes using toliet, bedpan, or urinal?: A Little Help from another person bathing (including washing, rinsing, drying)?: A Lot Help from another person to put on and taking off regular upper body clothing?: A Little Help from another person to put on and taking off regular lower body clothing?: A Lot 6 Click Score: 17    End of Session Equipment Utilized During Treatment: Gait belt;Rolling walker  OT Visit Diagnosis: Unsteadiness on feet (R26.81);Other abnormalities of gait and mobility (R26.89);Muscle weakness (generalized) (M62.81);Other symptoms and signs involving cognitive function   Activity Tolerance Patient tolerated treatment well   Patient Left in bed;with call bell/phone within reach;with bed alarm set   Nurse Communication Mobility status        Time:  1014-1040 OT Time Calculation (min): 26 min  Charges: OT General Charges $OT Visit: 1 Visit OT Treatments $Self Care/Home Management : 8-22 mins $Therapeutic Exercise: 8-22 mins  Bradd Canary, OTR/L Acute Rehab Services Office: [phone]   Lorre Munroe 06/30/2021, 12:43 PM

## 2021-06-30 NOTE — Progress Notes (Addendum)
PROGRESS NOTE    Dylan Weaver  FKC:[id number] DOB: 07-Jan-1966 DOA: 06/04/2021 [facility]: [name]   Brief Narrative:  This 55 y.o. male with severe alcohol abuse who presents to the ED for weakness and jaundice. On admission history was limited due to his encephalopathy and was found to be in acute liver failure related to alcohol abuse. Pt was started on IV fluids, antibiotics, PPI, octreotide (which have all being d/ced except for famotidine). Hospital course complicated by drop in hemoglobin to 5.4, FOBT was positive, required about 2U of PRBC. GI was consulted for further management.  Patient underwent EGD and colonoscopy.  Patient currently stable and awaiting  CIR placement, still no bed offers.  Now we are recommending inpatient rehab and patient needs omgoing medical assessments.    Assessment & Plan:   Principal Problem:   Acute liver failure with hepatic coma (HCC) Active Problems:   Alcohol use disorder, severe, dependence (HCC)   Thrombocytopenia (HCC)   Elevated LFTs   Acute renal failure (ARF) (HCC)   Protein-calorie malnutrition, severe   Acute liver failure with hepatic coma / Hepatic encephalopathy: Patient presented with intermittent confusion, abnormal LFTs. GI was consulted, He underwent EGD on 06/08/2021 showed esophagitis with no signs of bleeding, portal hypertensive gastropathy and gastritis His meld score was 33 on admission, liver functions now improving. MDF on admission was 7.6, was not  candidate for steroids Repeat ammonia level on 06/16/21  WNL Patient has received prophylaxis antibiotics for GI bleeding for a total of 7 days,  now completed. He is alert , back to his baseline mental status.  Recurrent falls: Patient has fallen on 9/27.  no acute injuries.   Patient understand the consequences and stating that he will not try again. Keep the bed at lowest level. Continue to monitor.  Possible GI bleed : Hemoglobin has been  stable. On admission hemoglobin was 5.4, transfused 2 unit of packed red blood cells GI consulted, EGD showed esophagitis with no signs of bleeding Colonoscopy on 06/10/2021 that showed multiple polyps, mild diverticulosis, nonbleeding internal hemorrhoids Received IV Protonix and octreotide, now transitioned to p.o. 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 Continue fluid restriction. Sodium is improving. 133   Hypotension Stable- baseline soft. Noted some episodes, as low as SBP in the 80s, appears asymptomatic LA mildly elevated, no need to trend Monitor closely. Continue midodrine.  Acute kidney injury > resolved. Hepatorenal syndrome Resolved, creatinine back to baseline, on admission 4.4  Alcohol abuse/dependence. Patient was counseled about quit alcohol. Continue MVT, thiamine, folic acid S/P CIWA protocol  Hypomagnesemia/ Hypokalemia: Replaced and resolved.  Chronic thrombocytopenia Likely 2/2 liver failure. Monitor closely  Weakness/deconditioning Physical therapy evaluated the patient and recommended skilled nursing facility. Still no bed offer-TOC is working on it Now we are recommending inpatient rehab   Malnutrition Type: Nutrition Problem: Severe Malnutrition Etiology: social / environmental circumstances     DVT prophylaxis: SCDs Code Status: Full code Family Communication: No family at bedside Disposition Plan:   Status is: Inpatient  Remains inpatient appropriate because:Inpatient level of care appropriate due to severity of illness  Dispo: The patient is from: Home              Anticipated d/c is to: inpatient rehab.  Patient currently is not medically stable to d/c.   Difficult to place  patient No   Consultants:  GI  Procedures: EGD and colonoscopy  Antimicrobials:  none  Subjective: Patient was seen and examined at bedside.  Overnight events noted.   Patient reports feeling better,  denies any pain. He states he has burning in his feet.  His eyes are still yellow.  Objective: Vitals:   06/30/21 0004 06/30/21 0612 06/30/21 0800 06/30/21 1100  BP: (!) 105/57 94/62 95/61  100/70  Pulse: 61 70 73 75  Resp:   18 18  Temp: 98.1 F (36.7 C) (!) 97.4 F (36.3 C) 97.6 F (36.4 C) (!) 97.5 F (36.4 C)  TempSrc: Oral Oral Oral Oral  SpO2: 100% 99% 100% 100%  Weight:      Height:        Intake/Output Summary (Last 24 hours) at 06/30/2021 1440 Last data filed at 06/30/2021 0905 Gross per 24 hour  Intake --  Output 300 ml  Net -300 ml   Filed Weights   06/26/21 0600 06/28/21 0338 06/29/21 0332  Weight: 68.6 kg 67.5 kg 68.4 kg    Examination:  General exam: Appears comfortable, not in any acute distress, his eyes are yellow. Respiratory system: Clear to auscultation. Respiratory effort normal. Cardiovascular system: S1-S2 heard, regular rate and rhythm, no murmur. Gastrointestinal system: Abdomen is soft, nontender, nondistended, BS +. Central nervous system: Alert and oriented x 3. No focal neurological deficits. Extremities: No edema, no cyanosis, no clubbing. Skin: No rashes, lesions or ulcers Psychiatry: Judgement and insight appear normal. Mood & affect appropriate.     Data Reviewed: I have personally reviewed following labs and imaging studies  CBC: Recent Labs  Lab 06/30/21 0124  WBC 9.7  HGB 10.5*  HCT 33.2*  MCV 102.8*  PLT 258   Basic Metabolic Panel: Recent Labs  Lab 06/30/21 0124  NA 133*  K 4.0  CL 102  CO2 22  GLUCOSE 99  BUN 10  CREATININE 0.56*  CALCIUM 9.3  MG 1.5*  PHOS 4.5   GFR: Estimated Creatinine Clearance: 100.9 mL/min (A) (by C-G formula based on SCr of 0.56 mg/dL (L)). Liver Function Tests: Recent Labs   Lab 06/30/21 0124  AST 130*  ALT 116*  ALKPHOS 180*  BILITOT 4.9*  PROT 7.4  ALBUMIN 2.4*   No results for input(s): LIPASE, AMYLASE in the last 168 hours. No results for input(s): AMMONIA in the last 168 hours. Coagulation Profile: No results for input(s): INR, PROTIME in the last 168 hours.  Cardiac Enzymes: No results for input(s): CKTOTAL, CKMB, CKMBINDEX, TROPONINI in the last 168 hours. BNP (last 3 results) No results for input(s): PROBNP in the last 8760 hours. HbA1C: No results for input(s): HGBA1C in the last 72 hours. CBG: No results for input(s): GLUCAP in the last 168 hours. Lipid Profile: No results for input(s): CHOL, HDL, LDLCALC, TRIG, CHOLHDL, LDLDIRECT in the last 72 hours. Thyroid Function Tests: No results for input(s): TSH, T4TOTAL, FREET4, T3FREE, THYROIDAB in the last 72 hours. Anemia Panel: No results for input(s): VITAMINB12, FOLATE, FERRITIN, TIBC, IRON, RETICCTPCT in the last 72 hours. Sepsis Labs: No results for input(s): PROCALCITON, LATICACIDVEN in the last 168 hours.  No results found for this or any previous visit (from the past 240 hour(s)).   Radiology Studies: No results found.   Scheduled Meds:  (feeding supplement) PROSource Plus  30 mL Oral BID BM   famotidine  40 mg Oral BID  folic acid  1 mg Oral Daily   gabapentin  100 mg Oral TID   magnesium oxide  800 mg Oral BID   midodrine  2.5 mg Oral TID WC   multivitamin with minerals  1 tablet Oral Daily   sodium chloride flush  10-40 mL Intracatheter Q12H   sodium chloride flush  3 mL Intravenous Q12H   thiamine  100 mg Oral Daily   vitamin B-12  1,000 mcg Oral Daily   Continuous Infusions:   LOS: 26 days    Time spent: 25 MINS    Karell Tukes, MD [facility]   If 7PM-7AM, please contact night-coverage

## 2021-06-30 NOTE — Progress Notes (Addendum)
Physical Therapy Treatment Patient Details Name: Dylan Weaver MRN: [id number] DOB: 09/01/1966 Today's Date: 06/30/2021   History of Present Illness Pt is a 55 y.o. male admitted 06/04/21 with weakness, jaundice. Found to have ARF with hepatic encephalopathy, possible GIB. CXR with possible L atelectasis. S/p colonoscopy 9/9 which showed multiple polyps, mild diverticulosis. Pt now medically stable for d/c and awaiting safe d/c plan (pt difficult to place due to insurance). PMH includes ETOH abuse.    PT Comments    Pt tolerates treatment well, ambulating for increased distances. Pt is able to tolerate dual-task ambulation and cognitive challenge without significant gait deviations noted with use of walker, however when taking seated rest break pt seems to forget about reinitiating ambulation and becomes solely focused on cognitive tasks. This raises concern for safety in the home setting when cooking and performing other ADLs. Pt declines attempts at ambulation without walker, reporting his balance is not great currently, showing some insight into mobility deficits. PT continues to recommend inpatient PT services at the time of discharge as the pt is unable to identify consistent caregiver support at this time and will continue to benefit from balance training to aide in a return to independence.  Recommendations for follow up therapy are one component of a multi-disciplinary discharge planning process, led by the attending physician.  Recommendations may be updated based on patient status, additional functional criteria and insurance authorization.  Follow Up Recommendations  Supervision/Assistance - 24 hour;CIR     Equipment Recommendations  Rolling walker with 5" wheels    Recommendations for Other Services       Precautions / Restrictions Precautions Precautions: Fall Restrictions Weight Bearing Restrictions: No     Mobility  Bed Mobility Overal bed mobility:  Independent Bed Mobility: Supine to Sit Rolling: Independent         General bed mobility comments: sitting EOB on entry    Transfers Overall transfer level: Needs assistance Equipment used: Rolling walker (2 wheeled) Transfers: Sit to/from [employer] Sit to Stand: Min guard Stand pivot transfers: Min guard       General transfer comment: min guard for standing from toilet due to posterior lean, Supervision for sit to stands from bed and chair with RW. At end of session with pt in chair,pt requesting to go back to bed - able to stand and pivot without AD and light handheld assist  Ambulation/Gait Ambulation/Gait assistance: Supervision Gait Distance (Feet): 200 Feet (200' x 2) Assistive device: Rolling walker (2 wheeled) Gait Pattern/deviations: Step-through pattern Gait velocity: functional Gait velocity interpretation: >2.62 ft/sec, indicative of community ambulatory General Gait Details: pt able to perform cognitive tasks of counting backward from 100 by 3s and naming an animal for each letter of the alphabet. Pt is able to navigate around traffic in the hallway and demonstrates no LOB or reduction in gait speed. Pt refuses ambulation attempts without device at this time.   Stairs             Wheelchair Mobility    Modified Rankin (Stroke Patients Only)       Balance Overall balance assessment: Needs assistance Sitting-balance support: No upper extremity supported;Feet supported Sitting balance-Leahy Scale: Good     Standing balance support: Bilateral upper extremity supported;Single extremity supported Standing balance-Leahy Scale: Fair Standing balance comment: BUE for mobility, able to stand without support at sink and for simple transfer from chair to bed  Cognition Arousal/Alertness: Awake/alert Behavior During Therapy: WFL for tasks assessed/performed;Impulsive Overall Cognitive Status: No  family/caregiver present to determine baseline cognitive functioning Area of Impairment: Problem solving;Safety/judgement;Attention;Following commands;Awareness;Memory                   Current Attention Level: Selective Memory: Decreased short-term memory Following Commands: Follows one step commands consistently Safety/Judgement: Decreased awareness of safety;Decreased awareness of deficits Awareness: Emergent Problem Solving: Difficulty sequencing;Requires verbal cues;Requires tactile cues General Comments: Can be impulsive, requires cues to slow down. Cues needed for safety awareness though improving (pulled call light after done with bathroom use though OT in room)      Exercises General Exercises - Upper Extremity Shoulder Flexion: Strengthening;Both;20 reps;Seated;Theraband Theraband Level (Shoulder Flexion): Level 1 (Yellow) Shoulder Horizontal ABduction: Strengthening;Both;Seated;Theraband;20 reps Theraband Level (Shoulder Horizontal Abduction): Level 1 (Yellow) Elbow Flexion: Strengthening;Both;10 reps;Seated;Theraband Theraband Level (Elbow Flexion): Level 1 (Yellow) Elbow Extension: Strengthening;Both;10 reps;Seated;Theraband Theraband Level (Elbow Extension): Level 1 (Yellow)    General Comments General comments (skin integrity, edema, etc.): VSS on RA. Pt forgets to continue ambulating after rest break, focused on cognitive task. Requires cues to initiate ambulation again      Pertinent Vitals/Pain Pain Assessment: No/denies pain Pain Intervention(s): Monitored during session    Home Living                      Prior Function            PT Goals (current goals can now be found in the care plan section) Acute Rehab PT Goals Patient Stated Goal: to eventually return home PT Goal Formulation: With patient Time For Goal Achievement: 07/07/21 Potential to Achieve Goals: Good Progress towards PT goals: Progressing toward goals    Frequency     Min 3X/week      PT Plan Current plan remains appropriate    Co-evaluation              AM-PAC PT "6 Clicks" Mobility   Outcome Measure  Help needed turning from your back to your side while in a flat bed without using bedrails?: None Help needed moving from lying on your back to sitting on the side of a flat bed without using bedrails?: None Help needed moving to and from a bed to a chair (including a wheelchair)?: A Little Help needed standing up from a chair using your arms (e.g., wheelchair or bedside chair)?: A Little Help needed to walk in hospital room?: A Little Help needed climbing 3-5 steps with a railing? : A Little 6 Click Score: 20    End of Session   Activity Tolerance: Patient tolerated treatment well Patient left: in bed;with call bell/phone within reach;with bed alarm set Nurse Communication: Mobility status PT Visit Diagnosis: Muscle weakness (generalized) (M62.81);Difficulty in walking, not elsewhere classified (R26.2)     Time: 8657-8469 PT Time Calculation (min) (ACUTE ONLY): 15 min  Charges:  $Gait Training: 8-22 mins                     Arlyss Gandy, PT, DPT Acute Rehabilitation Pager: [phone]    Arlyss Gandy 06/30/2021, 12:48 PM

## 2021-06-30 NOTE — Progress Notes (Signed)
Inpatient Rehabilitation Admissions Coordinator   Noted change in recommendations from therapy for CIR rather than SNF. Note patient does not have 24/7 assist at home and can benefit from more intensive therapy prior to d/c home with intermittent supervision. [facility] does not currently have bed availability this week. I have discussed with TOC, Kristi, RN CM.  Ottie Glazier, RN, MSN Rehab Admissions Coordinator [phone] 06/30/2021 1:24 PM

## 2021-07-01 DIAGNOSIS — K7201 Acute and subacute hepatic failure with coma: Secondary | ICD-10-CM | POA: Diagnosis not present

## 2021-07-01 NOTE — Progress Notes (Addendum)
Mobility Specialist Progress Note   07/01/21 1429  Mobility  Activity Refused mobility  $Mobility charge  --    Declined d/t stomach pain requesting for [place] to come back later.    Frederico Hamman Mobility Specialist Phone Number [phone]

## 2021-07-01 NOTE — Progress Notes (Signed)
PROGRESS NOTE    Dylan Weaver  GYJ:[id number] DOB: Oct 08, 1965 DOA: 06/04/2021  PCP: [name]   Brief Narrative:  This 55 y.o. male with severe alcohol abuse who presents to the ED for weakness and jaundice. On admission history was limited due to his encephalopathy and was found to be in acute liver failure related to alcohol abuse. Pt was started on IV fluids, antibiotics, PPI, octreotide (which have all being d/ced except for famotidine). Hospital course complicated by drop in hemoglobin to 5.4, FOBT was positive, required about 2U of PRBC. GI was consulted for further management.  Patient underwent EGD and colonoscopy.Patient currently stable and awaiting CIR placement, still no bed offers. Now we are recommending inpatient rehab and patient needs ongoing medical assessments.  Assessment & Plan:   Principal Problem:   Acute liver failure with hepatic coma (HCC) Active Problems:   Alcohol use disorder, severe, dependence (HCC)   Thrombocytopenia (HCC)   Elevated LFTs   Acute renal failure (ARF) (HCC)   Protein-calorie malnutrition, severe  Acute liver failure with hepatic coma / Hepatic encephalopathy: Patient presented with intermittent confusion, abnormal LFTs. GI was consulted, He underwent EGD on 06/08/2021 showed esophagitis with no signs of bleeding, portal hypertensive gastropathy and gastritis His meld score was 33 on admission, liver functions now improving. MDF on admission was 7.6, was not  candidate for steroids Repeat ammonia level on 06/16/21  WNL Patient has received prophylaxis antibiotics for GI bleeding for a total of 7 days,  now completed. He is alert , back to his baseline mental status.  Recurrent falls: Patient has fallen on 9/27.  no acute injuries.   Patient understand the consequences and stating that he will not try again. Keep the bed at lowest level. Continue to monitor.  Possible GI bleed : Hemoglobin has been stable. On  admission hemoglobin was 5.4, transfused 2 unit of packed red blood cells GI consulted, EGD showed esophagitis with no signs of bleeding Colonoscopy on 06/10/2021 that showed multiple polyps, mild diverticulosis, nonbleeding internal hemorrhoids Received IV Protonix and octreotide, now transitioned to p.o. 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 Continue fluid restriction. Sodium is improving. 133   Hypotension Stable- baseline soft. Noted some episodes, as low as SBP in the 80s, appears asymptomatic LA mildly elevated, no need to trend. Monitor closely. Continue midodrine.  Acute kidney injury > resolved. Hepatorenal syndrome Resolved, creatinine back to baseline, on admission 4.4  Alcohol abuse/dependence. Patient was counseled about quit alcohol. Continue MVT, thiamine, folic acid S/P CIWA protocol  Hypomagnesemia/ Hypokalemia: Replaced and resolved.  Chronic thrombocytopenia Likely 2/2 liver failure. Monitor closely  Weakness/deconditioning Physical therapy evaluated the patient and recommended skilled nursing facility. Still no bed offer-TOC is working on it. Now we are recommending inpatient rehab   Malnutrition Type: Nutrition Problem: Severe Malnutrition Etiology: social / environmental circumstances     DVT prophylaxis: SCDs Code Status: Full code Family Communication: No family at bedside Disposition Plan:   Status is: Inpatient  Remains inpatient appropriate because:Inpatient level of care appropriate due to severity of illness  Dispo: The patient is from: Home              Anticipated d/c is to: inpatient rehab.  Patient currently is not medically stable to d/c.   Difficult to place patient  No   Consultants:  GI  Procedures: EGD and colonoscopy  Antimicrobials:  none  Subjective: Patient was seen and examined at bedside.  Overnight events noted.   Patient reports feeling much improved.  He is sitting on the bed , eating breakfast.   He states he has burning in his feet.  His eyes are still yellow.  Objective: Vitals:   07/01/21 0032 07/01/21 0424 07/01/21 0728 07/01/21 1156  BP: 108/83 90/61 98/76  99/88  Pulse: 67 62 75 (!) 54  Resp: 19 18 18 19   Temp: 98.5 F (36.9 C) 98.6 F (37 C) 97.7 F (36.5 C)   TempSrc: Oral Oral Oral   SpO2: 100% 98% 99% 96%  Weight:  68.4 kg    Height:        Intake/Output Summary (Last 24 hours) at 07/01/2021 1412 Last data filed at 07/01/2021 1200 Gross per 24 hour  Intake 1312 ml  Output 1360 ml  Net -48 ml   Filed Weights   06/28/21 0338 06/29/21 0332 07/01/21 0424  Weight: 67.5 kg 68.4 kg 68.4 kg    Examination:  General exam: Appears comfortable, not in any acute distress, his eyes are still yellow. Respiratory system: Clear to auscultation bilaterally, RR 15  Cardiovascular system: S1-S2 heard, regular rate and rhythm, no murmur. Gastrointestinal system: Abdomen is soft, nontender, nondistended, BS +. Central nervous system: Alert and oriented x 3. No focal neurological deficits. Extremities: No edema, no cyanosis, no clubbing. Skin: No rashes, lesions or ulcers Psychiatry: Judgement and insight appear normal. Mood & affect appropriate.     Data Reviewed: I have personally reviewed following labs and imaging studies  CBC: Recent Labs  Lab 06/30/21 0124  WBC 9.7  HGB 10.5*  HCT 33.2*  MCV 102.8*  PLT 258   Basic Metabolic Panel: Recent Labs  Lab 06/30/21 0124  NA 133*  K 4.0  CL 102  CO2 22  GLUCOSE 99  BUN 10  CREATININE 0.56*  CALCIUM 9.3  MG 1.5*  PHOS 4.5   GFR: Estimated Creatinine Clearance: 100.9 mL/min (A) (by C-G formula based on SCr of 0.56 mg/dL (L)). Liver Function  Tests: Recent Labs  Lab 06/30/21 0124  AST 130*  ALT 116*  ALKPHOS 180*  BILITOT 4.9*  PROT 7.4  ALBUMIN 2.4*   No results for input(s): LIPASE, AMYLASE in the last 168 hours. No results for input(s): AMMONIA in the last 168 hours. Coagulation Profile: No results for input(s): INR, PROTIME in the last 168 hours.  Cardiac Enzymes: No results for input(s): CKTOTAL, CKMB, CKMBINDEX, TROPONINI in the last 168 hours. BNP (last 3 results) No results for input(s): PROBNP in the last 8760 hours. HbA1C: No results for input(s): HGBA1C in the last 72 hours. CBG: No results for input(s): GLUCAP in the last 168 hours. Lipid Profile: No results for input(s): CHOL, HDL, LDLCALC, TRIG, CHOLHDL, LDLDIRECT in the last 72 hours. Thyroid Function Tests: No results for input(s): TSH, T4TOTAL, FREET4, T3FREE, THYROIDAB in the last 72 hours. Anemia Panel: No results for input(s): VITAMINB12, FOLATE, FERRITIN, TIBC, IRON, RETICCTPCT in the last 72 hours. Sepsis Labs: No results for input(s): PROCALCITON, LATICACIDVEN in the last 168 hours.  No results found for this or any previous visit (from the past 240 hour(s)).   Radiology Studies: No results found.   Scheduled Meds:  (feeding supplement) PROSource Plus  30 mL Oral BID BM  famotidine  40 mg Oral BID   folic acid  1 mg Oral Daily   gabapentin  100 mg Oral TID   magnesium oxide  800 mg Oral BID   midodrine  2.5 mg Oral TID WC   multivitamin with minerals  1 tablet Oral Daily   sodium chloride flush  10-40 mL Intracatheter Q12H   sodium chloride flush  3 mL Intravenous Q12H   thiamine  100 mg Oral Daily   vitamin B-12  1,000 mcg Oral Daily   Continuous Infusions:   LOS: 27 days    Time spent: 25 MINS    Roxine Whittinghill, MD [facility]   If 7PM-7AM, please contact night-coverage

## 2021-07-02 DIAGNOSIS — K7201 Acute and subacute hepatic failure with coma: Secondary | ICD-10-CM | POA: Diagnosis not present

## 2021-07-02 NOTE — Progress Notes (Signed)
PROGRESS NOTE    Dylan Weaver  ZDG:[id number] DOB: 15-Apr-1966 DOA: 06/04/2021  [facility]P: [name]   Brief Narrative:  This 55 y.o. male with severe alcohol abuse who presents to the ED for weakness and jaundice. On admission history was limited due to his encephalopathy and was found to be in acute liver failure related to alcohol abuse. Pt was started on IV fluids, antibiotics, PPI, octreotide (which have all being d/ced except for famotidine). Hospital course complicated by drop in hemoglobin to 5.4, FOBT was positive, required about 2U of PRBC. GI was consulted for further management.  Patient underwent EGD and colonoscopy. Patient currently stable and awaiting CIR placement, still no bed offers. Now we are recommending inpatient rehab and patient needs ongoing medical assessments.  Assessment & Plan:   Principal Problem:   Acute liver failure with hepatic coma (HCC) Active Problems:   Alcohol use disorder, severe, dependence (HCC)   Thrombocytopenia (HCC)   Elevated LFTs   Acute renal failure (ARF) (HCC)   Protein-calorie malnutrition, severe  Acute liver failure with hepatic coma / Hepatic encephalopathy: Patient presented with intermittent confusion, abnormal LFTs. GI was consulted, He underwent EGD on 06/08/2021 showed esophagitis with no signs of bleeding, portal hypertensive gastropathy and gastritis His meld score was 33 on admission, liver functions now improving. MDF on admission was 7.6, was not  candidate for steroids Repeat ammonia level on 06/16/21  WNL Patient has received prophylactic antibiotics for GI bleeding for a total of 7 days,  now completed. He is alert , back to his baseline mental status.  Recurrent falls: Patient has fallen on 9/27.  no acute injuries.   Patient understand the consequences and stating that he will not try again. Continue to monitor.  Possible GI bleed : Hemoglobin has been stable. On admission hemoglobin was 5.4,  transfused 2 unit of packed red blood cells GI consulted, EGD showed esophagitis with no signs of bleeding Colonoscopy on 06/10/2021 that showed multiple polyps, mild diverticulosis, nonbleeding internal hemorrhoids Received IV Protonix and octreotide, now transitioned to p.o. 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 Osmolality 271, urine osmolality 637, urine sodium 128>>131>>130 > 133 TSH--> 4.99, free T4 1.17, T3 WNL Cortisol WNL, ACTH mildly decreased. Continue fluid restriction. Sodium is improving. 133   Hypotension Stable- baseline soft. Noted some episodes, as low as SBP in the 80s, appears asymptomatic LA mildly elevated, no need to trend. Monitor closely. Continue midodrine.  Acute kidney injury > resolved. Hepatorenal syndrome Resolved, creatinine back to baseline, on admission 4.4  Alcohol abuse/dependence. Patient was counseled about quit alcohol. Continue MVT, thiamine, folic acid S/P CIWA protocol  Hypomagnesemia/ Hypokalemia: Replaced and resolved.  Chronic thrombocytopenia Likely 2/2 liver failure. Monitor closely  Weakness/deconditioning Physical therapy evaluated the patient and recommended skilled nursing facility. Still no bed offer-TOC is working on it. Now we are recommending inpatient rehab   Malnutrition Type: Nutrition Problem: Severe Malnutrition Etiology: social / environmental circumstances     DVT prophylaxis: SCDs Code Status: Full code Family Communication: No family at bedside Disposition Plan:   Status is: Inpatient  Remains inpatient appropriate because:Inpatient level of care appropriate due to severity of illness  Dispo: The patient is from: Home              Anticipated d/c is to: inpatient rehab.              Patient currently is  not medically stable to d/c.   Difficult to place patient No   Consultants:   GI  Procedures: EGD and colonoscopy  Antimicrobials:  none  Subjective: Patient was seen and examined at bedside.  Overnight events noted.   Patient reports feeling much improved. He was lying in the bed,  reading books. Yellowness in the eyes has improved.  Objective: Vitals:   07/01/21 1944 07/01/21 2303 07/02/21 0436 07/02/21 0800  BP: 90/61 98/70 (!) 103/59 102/70  Pulse: 72 77 80 65  Resp: 17 17 18 18   Temp: 97.8 F (36.6 C) 97.7 F (36.5 C) 98 F (36.7 C) (!) 97.5 F (36.4 C)  TempSrc: Oral Oral Oral Oral  SpO2: 94% 94% 90% 100%  Weight:      Height:        Intake/Output Summary (Last 24 hours) at 07/02/2021 1112 Last data filed at 07/02/2021 0900 Gross per 24 hour  Intake 594 ml  Output 1010 ml  Net -416 ml   Filed Weights   06/28/21 0338 06/29/21 0332 07/01/21 0424  Weight: 67.5 kg 68.4 kg 68.4 kg    Examination:  General exam: Appears comfortable, not in any acute distress.   Respiratory system: Clear to auscultation bilaterally, RR 15 . cardiovascular system: S1-S2 heard, regular rate and rhythm, no murmur. Gastrointestinal system: Abdomen is soft, nontender, nondistended, BS +. Central nervous system: Alert and oriented x 3. No focal neurological deficits. Extremities: No edema, no cyanosis, no clubbing. Skin: No rashes, lesions or ulcers Psychiatry: Judgement and insight appear normal. Mood & affect appropriate.     Data Reviewed: I have personally reviewed following labs and imaging studies  CBC: Recent Labs  Lab 06/30/21 0124  WBC 9.7  HGB 10.5*  HCT 33.2*  MCV 102.8*  PLT 258   Basic Metabolic Panel: Recent Labs  Lab 06/30/21 0124  NA 133*  K 4.0  CL 102  CO2 22  GLUCOSE 99  BUN 10  CREATININE 0.56*  CALCIUM 9.3  MG 1.5*  PHOS 4.5   GFR: Estimated Creatinine Clearance: 100.9 mL/min (A) (by C-G formula based on SCr of 0.56 mg/dL (L)). Liver Function Tests: Recent Labs  Lab 06/30/21 0124  AST 130*  ALT 116*  ALKPHOS  180*  BILITOT 4.9*  PROT 7.4  ALBUMIN 2.4*   No results for input(s): LIPASE, AMYLASE in the last 168 hours. No results for input(s): AMMONIA in the last 168 hours. Coagulation Profile: No results for input(s): INR, PROTIME in the last 168 hours.  Cardiac Enzymes: No results for input(s): CKTOTAL, CKMB, CKMBINDEX, TROPONINI in the last 168 hours. BNP (last 3 results) No results for input(s): PROBNP in the last 8760 hours. HbA1C: No results for input(s): HGBA1C in the last 72 hours. CBG: No results for input(s): GLUCAP in the last 168 hours. Lipid Profile: No results for input(s): CHOL, HDL, LDLCALC, TRIG, CHOLHDL, LDLDIRECT in the last 72 hours. Thyroid Function Tests: No results for input(s): TSH, T4TOTAL, FREET4, T3FREE, THYROIDAB in the last 72 hours. Anemia Panel: No results for input(s): VITAMINB12, FOLATE, FERRITIN, TIBC, IRON, RETICCTPCT in the last 72 hours. Sepsis Labs: No results for input(s): PROCALCITON, LATICACIDVEN in the last 168 hours.  No results found for this or any previous visit (from the past 240 hour(s)).   Radiology Studies: No results found.   Scheduled Meds:  (feeding supplement) PROSource Plus  30 mL Oral BID BM   famotidine  40 mg Oral BID   folic acid  1 mg Oral  Daily   gabapentin  100 mg Oral TID   magnesium oxide  800 mg Oral BID   midodrine  2.5 mg Oral TID WC   multivitamin with minerals  1 tablet Oral Daily   sodium chloride flush  10-40 mL Intracatheter Q12H   sodium chloride flush  3 mL Intravenous Q12H   thiamine  100 mg Oral Daily   vitamin B-12  1,000 mcg Oral Daily   Continuous Infusions:   LOS: 28 days    Time spent: 25 MINS    Maegen Wigle, MD [facility]   If 7PM-7AM, please contact night-coverage

## 2021-07-03 DIAGNOSIS — K7201 Acute and subacute hepatic failure with coma: Secondary | ICD-10-CM | POA: Diagnosis not present

## 2021-07-03 LAB — BASIC METABOLIC PANEL
Anion gap: 8 (ref 5–15)
BUN: 11 mg/dL (ref 6–20)
CO2: 21 mmol/L — ABNORMAL LOW (ref 22–32)
Calcium: 9.1 mg/dL (ref 8.9–10.3)
Chloride: 103 mmol/L (ref 98–111)
Creatinine, Ser: 0.5 mg/dL — ABNORMAL LOW (ref 0.61–1.24)
GFR, Estimated: >60 mL/min (ref >60)
Glucose, Bld: 113 mg/dL — ABNORMAL HIGH (ref 70–99)
Potassium: 4 mmol/L (ref 3.5–5.1)
Sodium: 132 mmol/L — ABNORMAL LOW (ref 135–145)

## 2021-07-03 LAB — CBC
HCT: 32.4 % — ABNORMAL LOW (ref 39.0–52.0)
Hemoglobin: 10.1 g/dL — ABNORMAL LOW (ref 13.0–17.0)
MCH: 32.1 pg (ref 26.0–34.0)
MCHC: 31.2 g/dL (ref 30.0–36.0)
MCV: 102.9 fL — ABNORMAL HIGH (ref 80.0–100.0)
Platelets: 289 10*3/uL (ref 150–400)
RBC: 3.15 MIL/uL — ABNORMAL LOW (ref 4.22–5.81)
RDW: 14.5 % (ref 11.5–15.5)
WBC: 10.1 10*3/uL (ref 4.0–10.5)
nRBC: 0 % (ref 0.0–0.2)

## 2021-07-03 NOTE — Progress Notes (Addendum)
PROGRESS NOTE    Dylan Weaver  ZHY:[id number] DOB: 1966-01-15 DOA: 06/04/2021  PCP: [name]   Brief Narrative:  This 55 y.o. male with severe alcohol abuse who presents to the ED for weakness and jaundice. On admission history was limited due to his encephalopathy and was found to be in acute liver failure related to alcohol abuse. Pt was started on IV fluids, antibiotics, PPI, octreotide (which have all being d/ced except for famotidine). Hospital course complicated by drop in hemoglobin to 5.4, FOBT was positive, required about 2U of PRBC. GI was consulted for further management.  Patient underwent EGD and colonoscopy. Patient currently stable and awaiting CIR placement, still no bed offers. Now we are recommending inpatient rehab and patient needs ongoing medical assessments.  Assessment & Plan:   Principal Problem:   Acute liver failure with hepatic coma (HCC) Active Problems:   Alcohol use disorder, severe, dependence (HCC)   Thrombocytopenia (HCC)   Elevated LFTs   Acute renal failure (ARF) (HCC)   Protein-calorie malnutrition, severe  Acute liver failure with hepatic coma / Hepatic encephalopathy: Patient presented with intermittent confusion, abnormal LFTs. GI was consulted, He underwent EGD on 06/08/2021 showed esophagitis with no signs of bleeding, portal hypertensive gastropathy and gastritis His meld score was 33 on admission, liver functions now improving. MDF on admission was 7.6, was not  candidate for steroids. Repeat ammonia level on 06/16/21 : WNL Patient has received prophylactic antibiotics for GI bleeding for a total of 7 days,  now completed. He is alert , back to his baseline mental status.  Recurrent falls: Patient has fallen on 9/27.  No acute injuries.   Continue to monitor.  Seems more careful.  Possible GI bleed : Hemoglobin has been stable. On admission hemoglobin was 5.4, transfused 2 unit of packed red blood cells GI  consulted, EGD showed esophagitis with no signs of bleeding Colonoscopy on 06/10/2021 that showed multiple polyps, mild diverticulosis, nonbleeding internal hemorrhoids Received IV Protonix and octreotide, now transitioned to p.o. 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 Osmolality 271, urine osmolality 637, urine sodium 128 TSH--> 4.99, free T4 1.17, T3 WNL Cortisol WNL, ACTH mildly decreased. Continue fluid restriction. Sodium is improving. 133   Hypotension Stable- baseline low. Noted some episodes, as low as SBP in the 80s, appears asymptomatic LA mildly elevated, no need to trend. Monitor closely. Continue midodrine.  Acute kidney injury > resolved. Hepatorenal syndrome. Resolved, creatinine back to baseline, on admission 4.4  Alcohol abuse/dependence. Patient was counseled about quit alcohol. Continue MVT, thiamine, folic acid S/P CIWA protocol  Hypomagnesemia/ Hypokalemia: Replaced and resolved.  Chronic thrombocytopenia Likely 2/2 liver failure. Monitor closely  Weakness/deconditioning Physical therapy evaluated the patient and recommended skilled nursing facility. Still no bed offer-TOC is working on it. Now we are recommending inpatient rehab   Malnutrition Type: Nutrition Problem: Severe Malnutrition Etiology: social / environmental circumstances     DVT prophylaxis: SCDs Code Status: Full code Family Communication: No family at bedside Disposition Plan:   Status is: Inpatient  Remains inpatient appropriate because:Inpatient level of care appropriate due to severity of illness  Dispo: The patient is from: Home              Anticipated d/c is to: inpatient rehab.              Patient currently is medically stable for discharge to inpatient rehab   Difficult  to place patient No   Consultants:  GI  Procedures: EGD and  colonoscopy  Antimicrobials:  none  Subjective: Patient was seen and examined at bedside.  Overnight events noted.   Patient reports feeling much improved.  He was sitting reading on his iPhone. Yellowness in his eyes has improved.  Objective: Vitals:   07/02/21 2041 07/02/21 2322 07/03/21 0354 07/03/21 0938  BP: 96/69 93/65 90/60  107/70  Pulse: 66 65 68 76  Resp: 18 19 19 18   Temp: 97.9 F (36.6 C) 97.9 F (36.6 C) 97.9 F (36.6 C) 97.6 F (36.4 C)  TempSrc: Oral Oral Oral Oral  SpO2: 100% 100% 100% 100%  Weight:      Height:        Intake/Output Summary (Last 24 hours) at 07/03/2021 1256 Last data filed at 07/03/2021 09/02/2021 Gross per 24 hour  Intake 120 ml  Output 800 ml  Net -680 ml   Filed Weights   06/28/21 0338 06/29/21 0332 07/01/21 0424  Weight: 67.5 kg 68.4 kg 68.4 kg    Examination:  General exam: Appears comfortable not in any acute distress.  Yellowness improved. Respiratory system: Clear to auscultation bilaterally, RR 15 . cardiovascular system: S1-S2 heard, regular rate and rhythm, no murmur. Gastrointestinal system: Abdomen is soft, nontender, nondistended, BS +. Central nervous system: Alert and oriented x 3, no focal neurological deficits.   Extremities: No edema, no cyanosis, no clubbing. Skin: No rashes, lesions or ulcers Psychiatry: Judgement and insight appear normal. Mood & affect appropriate.    Data Reviewed: I have personally reviewed following labs and imaging studies  CBC: Recent Labs  Lab 06/30/21 0124 07/03/21 0132  WBC 9.7 10.1  HGB 10.5* 10.1*  HCT 33.2* 32.4*  MCV 102.8* 102.9*  PLT 258 289   Basic Metabolic Panel: Recent Labs  Lab 06/30/21 0124 07/03/21 0132  NA 133* 132*  K 4.0 4.0  CL 102 103  CO2 22 21*  GLUCOSE 99 113*  BUN 10 11  CREATININE 0.56* 0.50*  CALCIUM 9.3 9.1  MG 1.5*  --   PHOS 4.5  --    GFR: Estimated Creatinine Clearance: 100.9 mL/min (A) (by C-G formula based on SCr of 0.5 mg/dL  (L)). Liver Function Tests: Recent Labs  Lab 06/30/21 0124  AST 130*  ALT 116*  ALKPHOS 180*  BILITOT 4.9*  PROT 7.4  ALBUMIN 2.4*   No results for input(s): LIPASE, AMYLASE in the last 168 hours. No results for input(s): AMMONIA in the last 168 hours. Coagulation Profile: No results for input(s): INR, PROTIME in the last 168 hours.  Cardiac Enzymes: No results for input(s): CKTOTAL, CKMB, CKMBINDEX, TROPONINI in the last 168 hours. BNP (last 3 results) No results for input(s): PROBNP in the last 8760 hours. HbA1C: No results for input(s): HGBA1C in the last 72 hours. CBG: No results for input(s): GLUCAP in the last 168 hours. Lipid Profile: No results for input(s): CHOL, HDL, LDLCALC, TRIG, CHOLHDL, LDLDIRECT in the last 72 hours. Thyroid Function Tests: No results for input(s): TSH, T4TOTAL, FREET4, T3FREE, THYROIDAB in the last 72 hours. Anemia Panel: No results for input(s): VITAMINB12, FOLATE, FERRITIN, TIBC, IRON, RETICCTPCT in the last 72 hours. Sepsis Labs: No results for input(s): PROCALCITON, LATICACIDVEN in the last 168 hours.  No results found for this or any previous visit (from the past 240 hour(s)).   Radiology Studies: No results found.   Scheduled Meds:  (feeding supplement) PROSource Plus  30 mL Oral BID BM  famotidine  40 mg Oral BID   folic acid  1 mg Oral Daily   gabapentin  100 mg Oral TID   magnesium oxide  800 mg Oral BID   midodrine  2.5 mg Oral TID WC   multivitamin with minerals  1 tablet Oral Daily   sodium chloride flush  10-40 mL Intracatheter Q12H   sodium chloride flush  3 mL Intravenous Q12H   thiamine  100 mg Oral Daily   vitamin B-12  1,000 mcg Oral Daily   Continuous Infusions:   LOS: 29 days    Time spent: 25 MINS    Leightyn Cina, MD [facility]   If 7PM-7AM, please contact night-coverage

## 2021-07-04 ENCOUNTER — Encounter: Payer: Self-pay | Admitting: Gastroenterology

## 2021-07-04 DIAGNOSIS — K7201 Acute and subacute hepatic failure with coma: Secondary | ICD-10-CM | POA: Diagnosis not present

## 2021-07-04 NOTE — TOC Transition Note (Addendum)
Transition of Care [facility]) - CM/SW Discharge Note   Patient Details  Name: Dylan Weaver MRN: [id number] Date of Birth: 28-Feb-1966  Transition of Care [facility]) CM/SW Contact:  Leone Haven, RN Phone Number: 07/04/2021, 2:54 PM   Clinical Narrative:    NCM notified by CSW that patient needs [facility] services for going home, he has progressed to a level for dc home per PT services.  NCM offered choice , he chose [facility], NCM made referral to [place] with [facility], she is able to take referral for HHRN, HHPT, HHOT, HHAIDE.  Soc will begin  as soon as she can get authorization from [place] CSW at the [place].  [phone].  Also the [facility] is the only insurance he has.  NCM filled out an Urgent items for discharge form and scanned to [employer] at the [facility]. The patient states he will get an uber to go home.   Final next level of care: Home w Home Health Services Barriers to Discharge: No Barriers Identified   Patient Goals and CMS Choice Patient states their goals for this hospitalization and ongoing recovery are:: return home with [facility] CMS Medicare.gov Compare Post Acute Care list provided to:: Patient Choice offered to / list presented to : Patient  Discharge Placement                       Discharge Plan and Services In-house Referral: Clinical Social Work              DME Arranged: Dan Humphreys rolling DME Agency: [employer], [place] Date DME Agency Contacted: 07/04/21 Time DME Agency Contacted: 1452 Representative spoke with at DME Agency: Colin Mulders HH Arranged: RN, PT, OT, Nurse's Aide HH Agency: [facility] Date [facility] Agency Contacted: 07/04/21 Time HH Agency Contacted: 1453 Representative spoke with at [facility] Agency: Marylene Land  Social Determinants of Health (SDOH) Interventions     Readmission Risk Interventions No flowsheet data found.

## 2021-07-04 NOTE — TOC Progression Note (Addendum)
Transition of Care [facility]) - Progression Note    Patient Details  Name: Hilery Wintle MRN: [id number] Date of Birth: [birth year]  Transition [facility] Care [facility]) CM/SW Contact  Eduard Roux, [place] Phone Number: 07/04/2021, 12:33 PM  Clinical Narrative:     CSW visit with patient. CSW introduced self and explained role. CSW provided update VA denied inpatient rehab w/[facility]. Patient states understanding and expresses he wants to discharge home. He is comfortable and has progress to a level to to discharge home as indicated by PT. Patient states he does not need BSC but requested rolling walker. Patient states he will use [employer] for transportation.  CSW discuss ETOH and encourage the patient to utilize [place] services to assist him in continuing his sobriety. Patient states " I am done with drinking" . He shared he loss his mother and recently divorced. CSW urged him on the importance of grief & Loss counseling. He agreed to contact the [facility] in [place]. Patient states he has their contact information and he lives less than 10 minutes from the center.   TOC will continue to follow and assist with discharge planning.  Antony Blackbird, MSW, LCSW Clinical Social Worker    Expected Discharge Plan: Skilled Nursing Facility Barriers to Discharge: Inadequate or no insurance, Continued Medical Work up, SNF Pending bed offer  Expected Discharge Plan and Services Expected Discharge Plan: Skilled Nursing Facility In-house Referral: Clinical Social Work       Expected Discharge Date: 06/14/21                                     Social Determinants of Health (SDOH) Interventions    Readmission Risk Interventions No flowsheet data found.

## 2021-07-04 NOTE — Progress Notes (Signed)
Occupational Therapy Treatment Patient Details Name: Dylan Weaver MRN: [id number] DOB: 1966/06/22 Today's Date: 07/04/2021   History of present illness Pt is a 55 y.o. male admitted 06/04/21 with weakness, jaundice. Found to have ARF with hepatic encephalopathy, possible GIB. CXR with possible L atelectasis. S/p colonoscopy 9/9 which showed multiple polyps, mild diverticulosis. PMH includes ETOH abuse.   OT comments  Session focused on pill box test administration to further assess executive function, attention to detail and ability to safely manage medications at home. With increased time (8.5 min rather than allowed 5 min), pt was able to complete pill box test without errors. Pt was able to self correct and implement strategies to ensure compliance to meds instructions. Discussed other IADL mgmt at home, fall prevention strategies with ADLs, and any DME needs. Pt was able to mobilize to/from bathroom without AD and manage toileting task without physical assist. Pt motivated by plan to DC home soon; recs updated to reflect pt response to session.    Recommendations for follow up therapy are one component of a multi-disciplinary discharge planning process, led by the attending physician.  Recommendations may be updated based on patient status, additional functional criteria and insurance authorization.    Follow Up Recommendations  Home health OT;Supervision - Intermittent    Equipment Recommendations  Other (comment) (Rolling walker)    Recommendations for Other Services      Precautions / Restrictions Precautions Precautions: Fall Restrictions Weight Bearing Restrictions: No       Mobility Bed Mobility Overal bed mobility: Independent Bed Mobility: Supine to Sit           General bed mobility comments: received in chair    Transfers Overall transfer level: Independent Equipment used: None Transfers: Sit to/from Stand Sit to Stand: Independent          General transfer comment: pt quick to stand from recliner without AD to mobilize to bathroom    Balance Overall balance assessment: Needs assistance Sitting-balance support: No upper extremity supported;Feet supported Sitting balance-Leahy Scale: Good Sitting balance - Comments: indep to don socks/shoes and pants sitting EOB   Standing balance support: During functional activity;No upper extremity supported Standing balance-Leahy Scale: Good Standing balance comment: able to mobilize without AD short distances                           ADL either performed or assessed with clinical judgement   ADL Overall ADL's : Needs assistance/impaired                         Toilet Transfer: Supervision/safety;Ambulation;Regular Teacher, adult education Details (indicate cue type and reason): distant supervision for safety, still with quick movements at times but no LOB Toileting- Clothing Manipulation and Hygiene: Modified independent;Sit to/from stand Toileting - Clothing Manipulation Details (indicate cue type and reason): use of grab bars     Functional mobility during ADLs: Supervision/safety General ADL Comments: Session focused on administration of pill box test with pt able to self correct and complete test without errors though increased time needed. Pt able to mobilize without AD short distances without concerns. Problem solved DME needs (only RW) and fall prevention (use of shower chair or grab bars at home)     Vision   Vision Assessment?: No apparent visual deficits   Perception     Praxis      Cognition Arousal/Alertness: Awake/alert Behavior During Therapy: WFL for tasks assessed/performed  Overall Cognitive Status: No family/caregiver present to determine baseline cognitive functioning Area of Impairment: Attention;Safety/judgement;Problem solving;Awareness;Memory                   Current Attention Level: Selective Memory: Decreased  short-term memory Following Commands: Follows one step commands consistently;Follows multi-step commands consistently Safety/Judgement: Decreased awareness of safety;Decreased awareness of deficits Awareness: Emergent Problem Solving: Requires verbal cues General Comments: Improving awareness and attention; still requires cues to stay on and complete task. Administered pill box test to assess executive functioning, attention, and problem solving in ability to safely manage medications at home. Pt able to independently self correct and comprehend medication instructions to fill out pill box correctly. Pt did take 8.5 min to complete this task (5 min usually time allowed) but pt was cautious to ensure proper completion of task        Exercises     Shoulder Instructions       General Comments Pt reports potential for d/c home today. Pt reports preference to d/c home, does not feel he needs rehab. Increased time discussing safe d/c plan, including having family/friends check in on pt daily, assist for med management, strategies for maintaining sobriety (ETOH), safety and fall risk reduction. Pt reports various family members able to assist as needed    Pertinent Vitals/ Pain       Pain Assessment: No/denies pain Pain Intervention(s): Monitored during session  Home Living                                          Prior Functioning/Environment              Frequency  Min 2X/week        Progress Toward Goals  OT Goals(current goals can now be found in the care plan section)  Progress towards OT goals: Progressing toward goals  Acute Rehab OT Goals Patient Stated Goal: to eventually return home OT Goal Formulation: With patient Time For Goal Achievement: 07/06/21 Potential to Achieve Goals: Fair ADL Goals Pt Will Perform Grooming: with modified independence;standing Pt Will Perform Lower Body Dressing: with modified independence;sitting/lateral leans;sit  to/from stand Pt Will Transfer to Toilet: with min guard assist;ambulating Pt/caregiver will Perform Home Exercise Program: Increased strength;Both right and left upper extremity;With theraband;Independently;With written HEP provided  Plan Discharge plan remains appropriate;Frequency remains appropriate    Co-evaluation                 AM-PAC OT "6 Clicks" Daily Activity     Outcome Measure   Help from another person eating meals?: None Help from another person taking care of personal grooming?: None Help from another person toileting, which includes using toliet, bedpan, or urinal?: None Help from another person bathing (including washing, rinsing, drying)?: A Little Help from another person to put on and taking off regular upper body clothing?: None Help from another person to put on and taking off regular lower body clothing?: A Little 6 Click Score: 22    End of Session    OT Visit Diagnosis: Unsteadiness on feet (R26.81);Other abnormalities of gait and mobility (R26.89);Muscle weakness (generalized) (M62.81);Other symptoms and signs involving cognitive function   Activity Tolerance Patient tolerated treatment well   Patient Left in chair;with call bell/phone within reach;with chair alarm set   Nurse Communication Mobility status        Time: 7829-5621 OT Time Calculation (  min): 35 min  Charges: OT General Charges $OT Visit: 1 Visit OT Treatments $Self Care/Home Management : 23-37 mins  Bradd Canary, OTR/L Acute Rehab Services Office: [phone]   Lorre Munroe 07/04/2021, 1:39 PM

## 2021-07-04 NOTE — Progress Notes (Signed)
PROGRESS NOTE    Dylan Weaver  WVP:[id number] DOB: 03-Sep-1966 DOA: 06/04/2021  PCP: [name]   Brief Narrative:  This 55 y.o. male with severe alcohol abuse who presents to the ED for weakness and jaundice. On admission history was limited due to his encephalopathy and was found to be in acute liver failure related to alcohol abuse. Pt was started on IV fluids, antibiotics, PPI, octreotide (which have all being d/ced except for famotidine). Hospital course complicated by drop in hemoglobin to 5.4, FOBT was positive, required about 2U of PRBC. GI was consulted for further management.  Patient underwent EGD and colonoscopy.  PT recommended CIR but patient has no bed.  Repeat PT evaluation suggested  home PT and OT, aide and RN.  Assessment & Plan:   Principal Problem:   Acute liver failure with hepatic coma (HCC) Active Problems:   Alcohol use disorder, severe, dependence (HCC)   Thrombocytopenia (HCC)   Elevated LFTs   Acute renal failure (ARF) (HCC)   Protein-calorie malnutrition, severe  Acute liver failure with hepatic coma / Hepatic encephalopathy: Patient presented with intermittent confusion, abnormal LFTs. GI was consulted, He underwent EGD on 06/08/2021 showed esophagitis with no signs of bleeding, portal hypertensive gastropathy and gastritis His meld score was 33 on admission, liver functions now improving. MDF on admission was 7.6, was not  candidate for steroids. Repeat ammonia level on 06/16/21 : WNL Patient has received prophylactic antibiotics for GI bleeding for a total of 7 days,  now completed. Hepatic encephalopathy has resolved,  Patient is back to his baseline mental status.  Recurrent falls: Patient has fallen on 9/27.  No acute injuries.   Continue to monitor.  Seems more careful.  Possible GI bleed : Hemoglobin has been stable. On admission hemoglobin was 5.4, transfused 2 unit of packed red blood cells GI consulted, EGD showed  esophagitis with no signs of bleeding Colonoscopy on 06/10/2021 that showed multiple polyps, mild diverticulosis, nonbleeding internal hemorrhoids Received IV Protonix and octreotide, now transitioned to p.o. 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 TSH--> 4.99, free T4 1.17, T3 WNL Cortisol WNL, ACTH mildly decreased. Continue fluid restriction. Sodium is improving. 132   Hypotension:  Stable- baseline low. Noted some episodes, as low as SBP in the 80s, appears asymptomatic Monitor closely. Continue midodrine.  Acute kidney injury > resolved. Hepatorenal syndrome. Resolved, creatinine back to baseline, on admission 4.4  Alcohol abuse/dependence. Patient was counseled about quit alcohol. Continue MVT, thiamine, folic acid S/P CIWA protocol.  Hypomagnesemia/ Hypokalemia: Replaced and resolved.  Chronic thrombocytopenia Likely 2/2 liver failure. Monitor closely  Weakness/deconditioning Physical therapy evaluated the patient and recommended skilled nursing facility. Still no bed offer-TOC is working on it. PT reassessment suggests home OT PT RN and aide   Malnutrition Type: Nutrition Problem: Severe Malnutrition Etiology: social / environmental circumstances     DVT prophylaxis: SCDs Code Status: Full code Family Communication: No family at bedside Disposition Plan:   Status is: Inpatient  Remains inpatient appropriate because:Inpatient level of care appropriate due to severity of illness  Dispo: The patient is from: Home              Anticipated d/c is to Home with home health services ON 07/04/21              Patient currently is medically stable for discharge to inpatient rehab   Difficult to place patient No  Consultants:  GI  Procedures: EGD and colonoscopy  Antimicrobials:  none  Subjective: Patient was seen and examined at bedside.   Overnight events noted.   Patient reports feeling better,  he has participated in physical therapy and was recommended home health services. Patient reports he lives by self and he wants all the services to be started before he can go home. He reports feeling scared,  does not want to go home today.  Objective: Vitals:   07/04/21 0352 07/04/21 0535 07/04/21 0810 07/04/21 1128  BP: 107/71  100/64 106/82  Pulse: 65   81  Resp: 18  18   Temp: 97.8 F (36.6 C)  97.6 F (36.4 C) 97.8 F (36.6 C)  TempSrc: Oral  Oral Oral  SpO2: 97%  100% 100%  Weight:  68.6 kg    Height:        Intake/Output Summary (Last 24 hours) at 07/04/2021 1426 Last data filed at 07/04/2021 0809 Gross per 24 hour  Intake 360 ml  Output 625 ml  Net -265 ml   Filed Weights   06/29/21 0332 07/01/21 0424 07/04/21 0535  Weight: 68.4 kg 68.4 kg 68.6 kg    Examination:  General exam: Appears comfortable not in any acute distress.  Deconditioned. Respiratory system: Clear to auscultation bilaterally, RR 15 . cardiovascular system: S1-S2 heard, regular rate and rhythm, no murmur. Gastrointestinal system: Abdomen is soft, nontender, nondistended, BS +. Central nervous system: Alert and oriented x 3, no focal neurological deficits.   Extremities: No edema, no cyanosis, no clubbing. Skin: No rashes, lesions or ulcers Psychiatry: Judgement and insight appear normal. Mood & affect appropriate.    Data Reviewed: I have personally reviewed following labs and imaging studies  CBC: Recent Labs  Lab 06/30/21 0124 07/03/21 0132  WBC 9.7 10.1  HGB 10.5* 10.1*  HCT 33.2* 32.4*  MCV 102.8* 102.9*  PLT 258 289   Basic Metabolic Panel: Recent Labs  Lab 06/30/21 0124 07/03/21 0132  NA 133* 132*  K 4.0 4.0  CL 102 103  CO2 22 21*  GLUCOSE 99 113*  BUN 10 11  CREATININE 0.56* 0.50*  CALCIUM 9.3 9.1  MG 1.5*  --   PHOS 4.5  --    GFR: Estimated Creatinine Clearance: 101.2 mL/min (A) (by C-G formula based  on SCr of 0.5 mg/dL (L)). Liver Function Tests: Recent Labs  Lab 06/30/21 0124  AST 130*  ALT 116*  ALKPHOS 180*  BILITOT 4.9*  PROT 7.4  ALBUMIN 2.4*   No results for input(s): LIPASE, AMYLASE in the last 168 hours. No results for input(s): AMMONIA in the last 168 hours. Coagulation Profile: No results for input(s): INR, PROTIME in the last 168 hours.  Cardiac Enzymes: No results for input(s): CKTOTAL, CKMB, CKMBINDEX, TROPONINI in the last 168 hours. BNP (last 3 results) No results for input(s): PROBNP in the last 8760 hours. HbA1C: No results for input(s): HGBA1C in the last 72 hours. CBG: No results for input(s): GLUCAP in the last 168 hours. Lipid Profile: No results for input(s): CHOL, HDL, LDLCALC, TRIG, CHOLHDL, LDLDIRECT in the last 72 hours. Thyroid Function Tests: No results for input(s): TSH, T4TOTAL, FREET4, T3FREE, THYROIDAB in the last 72 hours. Anemia Panel: No results for input(s): VITAMINB12, FOLATE, FERRITIN, TIBC, IRON, RETICCTPCT in the last 72 hours. Sepsis Labs: No results for input(s): PROCALCITON, LATICACIDVEN in the last 168 hours.  No results found for this or any previous visit (from the past 240 hour(s)).   Radiology  Studies: No results found.   Scheduled Meds:  (feeding supplement) PROSource Plus  30 mL Oral BID BM   famotidine  40 mg Oral BID   folic acid  1 mg Oral Daily   gabapentin  100 mg Oral TID   magnesium oxide  800 mg Oral BID   midodrine  2.5 mg Oral TID WC   multivitamin with minerals  1 tablet Oral Daily   sodium chloride flush  10-40 mL Intracatheter Q12H   sodium chloride flush  3 mL Intravenous Q12H   thiamine  100 mg Oral Daily   vitamin B-12  1,000 mcg Oral Daily   Continuous Infusions:   LOS: 30 days    Time spent: 25 MINS    Matayah Reyburn, MD [facility]   If 7PM-7AM, please contact night-coverage

## 2021-07-04 NOTE — Progress Notes (Signed)
Physical Therapy Treatment Patient Details Name: Dylan Weaver MRN: [id number] DOB: [birth year] Today's Date: 07/04/2021   History of Present Illness Pt is a 55 y.o. male admitted 06/04/21 with weakness, jaundice. Found to have ARF with hepatic encephalopathy, possible GIB. CXR with possible L atelectasis. S/p colonoscopy 9/9 which showed multiple polyps, mild diverticulosis. PMH includes ETOH abuse.   PT Comments    Pt progressing well with mobility. Pt declines rehab, reports he feels ready to d/c home. Pt ambulating without DME, supervision for safety; able to perform seated/standing ADL tasks without assist. Pt still requires cues for attention and problem solving. Increased time discussing safety with d/c home, including fall risk reduction, DME needs, medication management, maintaining sobriety. If to remain admitted, will continue to follow acutely to address established goals.    Recommendations for follow up therapy are one component of a multi-disciplinary discharge planning process, led by the attending physician.  Recommendations may be updated based on patient status, additional functional criteria and insurance authorization.  Follow Up Recommendations  Home health PT;Supervision - Intermittent     Equipment Recommendations  Rolling walker with 5" wheels    Recommendations for Other Services       Precautions / Restrictions Precautions Precautions: Fall Restrictions Weight Bearing Restrictions: No     Mobility  Bed Mobility Overal bed mobility: Independent Bed Mobility: Supine to Sit                Transfers Overall transfer level: Needs assistance Equipment used: None Transfers: Sit to/from Stand Sit to Stand: Supervision         General transfer comment: Multiple sit<>stands from EOB and recliner, supervision for safety  Ambulation/Gait Ambulation/Gait assistance: Supervision Gait Distance (Feet): 168 Feet (+ 168') Assistive device:  None Gait Pattern/deviations: Step-through pattern;Decreased stride length;Drifts right/left   Gait velocity interpretation: >2.62 ft/sec, indicative of community ambulatory General Gait Details: mostly steady gait without DME, pt frequently reaching to hallway rail or counter for UE support; improved gait speed and stability. Pt reports plan to use walker at home because "my balance still isn't great." 1x seated rest break   Stairs             Wheelchair Mobility    Modified Rankin (Stroke Patients Only)       Balance Overall balance assessment: Needs assistance   Sitting balance-Leahy Scale: Good Sitting balance - Comments: indep to don socks/shoes and pants sitting EOB   Standing balance support: Single extremity supported;During functional activity;No upper extremity supported Standing balance-Leahy Scale: Fair Standing balance comment: Able to perform standing ADL tasks (donning pants, brushing teeth, styling hair) without UE support                            Cognition Arousal/Alertness: Awake/alert Behavior During Therapy: WFL for tasks assessed/performed Overall Cognitive Status: No family/caregiver present to determine baseline cognitive functioning Area of Impairment: Attention;Safety/judgement;Awareness;Problem solving                   Current Attention Level: Selective     Safety/Judgement: Decreased awareness of safety;Decreased awareness of deficits Awareness: Emergent Problem Solving: Requires verbal cues General Comments: Improving awareness and attention; still requires cues to stay on and complete task. Pt aware he needs assist for medication management upon return home; reports he feels confident continuing to manage bills (he has been doing via his phone during admission), meals and other household tasks. Able to problem solve  through strategies for maintaining sobriety (ETOH) upon return home as well, which pt initiated this  convo      Exercises      General Comments General comments (skin integrity, edema, etc.): Pt reports potential for d/c home today. Pt reports preference to d/c home, does not feel he needs rehab. Increased time discussing safe d/c plan, including having family/friends check in on pt daily, assist for med management, strategies for maintaining sobriety (ETOH), safety and fall risk reduction. Pt reports various family members able to assist as needed      Pertinent Vitals/Pain Pain Assessment: No/denies pain Pain Intervention(s): Monitored during session    Home Living                      Prior Function            PT Goals (current goals can now be found in the care plan section) Progress towards PT goals: Progressing toward goals    Frequency    Min 3X/week      PT Plan Discharge plan needs to be updated    Co-evaluation              AM-PAC PT "6 Clicks" Mobility   Outcome Measure  Help needed turning from your back to your side while in a flat bed without using bedrails?: None Help needed moving from lying on your back to sitting on the side of a flat bed without using bedrails?: None Help needed moving to and from a bed to a chair (including a wheelchair)?: A Little Help needed standing up from a chair using your arms (e.g., wheelchair or bedside chair)?: A Little Help needed to walk in hospital room?: A Little Help needed climbing 3-5 steps with a railing? : A Little 6 Click Score: 20    End of Session Equipment Utilized During Treatment: Gait belt Activity Tolerance: Patient tolerated treatment well Patient left: in chair;with call bell/phone within reach;with chair alarm set Nurse Communication: Mobility status PT Visit Diagnosis: Muscle weakness (generalized) (M62.81);Difficulty in walking, not elsewhere classified (R26.2)     Time: 0301-3143 PT Time Calculation (min) (ACUTE ONLY): 22 min  Charges:  $Self Care/Home Management: 8-22                      Ina Homes, PT, DPT Acute Rehabilitation Services  Pager [phone] Office [phone]  Malachy Chamber 07/04/2021, 10:47 AM

## 2021-07-04 NOTE — Progress Notes (Signed)
Nutrition Follow-up  DOCUMENTATION CODES:   Severe malnutrition in context of social or environmental circumstances  INTERVENTION:   Continue MVI w/ minerals daily. Provide double protein with all meals. Continue ProSource Plus BID, provides 100 kcal and 15 grams of protein each Add snacks TID between meals.  NUTRITION DIAGNOSIS:   Severe Malnutrition related to social / environmental circumstances as evidenced by moderate fat depletion, severe muscle depletion, energy intake < 75% for > or equal to 3 months.   Ongoing  GOAL:   Patient will meet greater than or equal to 90% of their needs  Progressing  MONITOR:   PO intake, Supplement acceptance, Weight trends, Labs, I & O's, Skin  REASON FOR ASSESSMENT:   Consult Assessment of nutrition requirement/status, Poor PO  ASSESSMENT:   55 yo male with a PMH of EtOH abuse who presents with acute liver failure with hepatic coma (encephalopathy). Came in with weakness and jaundice.  Patient reports good appetite. Eating 75-100% of all meals and taking Prosource  Plus supplements BID. He requests snacks between meals, RD to arrange snacks TID between meals.   Admission weight: 71.5 kg  Current weight: 68.6 kg   Medications reviewed and include folic acid, pepcid, mag-ox, MVI with minerals, vitamin B12, thiamine. Labs reviewed. Na 132 (L) 10/2  Diet Order:   Diet Order             Diet regular Room service appropriate? Yes with Assist; Fluid consistency: Thin; Fluid restriction: 1800 mL Fluid  Diet effective now           Diet - low sodium heart healthy                   EDUCATION NEEDS:   Education needs have been addressed  Skin:   skin tears to buttocks  Last BM:  10/1  Height:   Ht Readings from Last 1 Encounters:  06/09/21 6' (1.829 m)    Weight:   Wt Readings from Last 1 Encounters:  07/04/21 68.6 kg    Ideal Body Weight:  80.9 kg  BMI:  Body mass index is 20.51 kg/m.  Estimated  Nutritional Needs:   Kcal:  2200-2400  Protein:  100-115 grams  Fluid:  >2.2 L   Gabriel Rainwater, RD, LDN, CNSC Please refer to Amion for contact information.

## 2021-07-05 DIAGNOSIS — K7041 Alcoholic hepatic failure with coma: Secondary | ICD-10-CM | POA: Diagnosis not present

## 2021-07-05 DIAGNOSIS — K7201 Acute and subacute hepatic failure with coma: Secondary | ICD-10-CM | POA: Diagnosis not present

## 2021-07-05 MED ORDER — INFLUENZA VAC SPLIT QUAD 0.5 ML IM SUSY
0.5000 mL | PREFILLED_SYRINGE | INTRAMUSCULAR | Status: AC
  Administered 2021-07-05: 0.5 mL via INTRAMUSCULAR
  Filled 2021-07-05: qty 0.5

## 2021-07-05 MED ORDER — MAGNESIUM OXIDE -MG SUPPLEMENT 400 (240 MG) MG PO TABS: 800.0000 mg | ORAL_TABLET | Freq: Two times a day (BID) | ORAL | 0 refills | Status: DC

## 2021-07-05 MED ORDER — CYANOCOBALAMIN 1000 MCG PO TABS: 1000.0000 ug | ORAL_TABLET | Freq: Every day | ORAL | 1 refills | Status: DC

## 2021-07-05 NOTE — Discharge Summary (Signed)
Physician Discharge Summary  Dylan Weaver KKX:[id number] DOB: Nov 03, 1965 DOA: 06/04/2021  PCP: [name]  Admit date: 06/04/2021  Discharge date: 07/05/2021  Admitted From: Home.  Disposition: Home health services.  Recommendations for Outpatient Follow-up:  Follow up with PCP in 1-2 weeks. Please obtain BMP/CBC in one week. Advised to refrain from alcohol.  Home Health: Home PT/OT/Aide/RN. Equipment/Devices: Rolling walker.  Discharge Condition: Stable CODE STATUS:Full code Diet recommendation: Heart Healthy  Brief Summary / Hospital Course: This 55 y.o. male with severe alcohol abuse who presented to the ED for generalized weakness and jaundice. On admission history was limited due to his encephalopathy and was found to be in acute liver failure related to alcohol abuse. Pt was started on IV fluids, antibiotics, PPI, octreotide (which have all being d/ced except for famotidine). Hospital course complicated by drop in hemoglobin to 5.4, FOBT was positive, required about 2U of PRBC. GI was consulted for further management.  Patient underwent EGD and colonoscopy. He underwent EGD on 06/08/2021 showed esophagitis with no signs of bleeding, portal hypertensive gastropathy and gastritis.  PT recommended CIR but patient has no bed.  Repeat PT evaluation suggested  home PT and OT, aide and RN.  Patient feels better and patient is being discharged home.  Home health services been arranged.  He was managed for below problems.  Discharge Diagnoses:  Principal Problem:   Acute liver failure with hepatic coma (HCC) Active Problems:   Alcohol use disorder, severe, dependence (HCC)   Thrombocytopenia (HCC)   Elevated LFTs   Acute renal failure (ARF) (HCC)   Protein-calorie malnutrition, severe  Acute liver failure with hepatic coma / Hepatic encephalopathy: Patient presented with intermittent confusion, abnormal LFTs. GI was consulted, He underwent EGD on 06/08/2021 showed  esophagitis with no signs of bleeding, portal hypertensive gastropathy and gastritis His meld score was 33 on admission, liver functions now improving. MDF on admission was 7.6, was not  candidate for steroids. Repeat ammonia level on 06/16/21 : WNL Patient has received prophylactic antibiotics for GI bleeding for a total of 7 days,  now completed. Hepatic encephalopathy has resolved,  Patient is back to his baseline mental status.   Recurrent falls: Patient has fallen on 9/27.  No acute injuries.   Continue to monitor.  Seems more careful.  Possible GI bleed : Hemoglobin has been stable. On admission hemoglobin was 5.4, transfused 2 unit of packed red blood cells GI consulted, EGD showed esophagitis with no signs of bleeding Colonoscopy on 06/10/2021 that showed multiple polyps, mild diverticulosis, nonbleeding internal hemorrhoids Received IV Protonix and octreotide, now transitioned to p.o. 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 TSH--> 4.99, free T4 1.17, T3 WNL Cortisol WNL, ACTH mildly decreased. Continue fluid restriction. Sodium is improving. 132   Hypotension:  Stable- baseline low. Noted some episodes, as low as SBP in the 80s, appears asymptomatic Monitor closely. Continue midodrine.  Acute kidney injury > resolved. Hepatorenal syndrome. Resolved, creatinine back to baseline, on admission 4.4  Alcohol abuse/dependence. Patient was counseled about quit alcohol. Continue MVT, thiamine, folic acid S/P CIWA protocol.  Hypomagnesemia/ Hypokalemia: Replaced and resolved.  Chronic thrombocytopenia Likely 2/2 liver failure. Monitor closely  Weakness/deconditioning Physical therapy evaluated the patient and recommended skilled nursing facility. Still no bed offer-TOC is working on it. PT reassessment suggests home OT PT RN and aide   Malnutrition  Type: Nutrition Problem: Severe Malnutrition Etiology: social / environmental  circumstances  Discharge Instructions  Discharge Instructions     Call MD for:  difficulty breathing, headache or visual disturbances   Complete by: As directed    Call MD for:  persistant dizziness or light-headedness   Complete by: As directed    Diet - low sodium heart healthy   Complete by: As directed    Diet - low sodium heart healthy   Complete by: As directed    Diet - low sodium heart healthy   Complete by: As directed    Diet Carb Modified   Complete by: As directed    Discharge instructions   Complete by: As directed    Advised to follow-up with primary care physician in 1 week. Advised to refrain from alcohol.  Home health services been arranged.   Discharge wound care:   Complete by: As directed    Per wound care instructions   Discharge wound care:   Complete by: As directed    Follow up PCP   Discharge wound care:   Complete by: As directed    Follow-up outpatient.   Increase activity slowly   Complete by: As directed    Increase activity slowly   Complete by: As directed    Increase activity slowly   Complete by: As directed       Allergies as of 07/05/2021   No Known Allergies      Medication List     STOP taking these medications    doxycycline 100 MG capsule Commonly known as: VIBRAMYCIN   ketoconazole 2 % cream Commonly known as: NIZORAL   liver oil-zinc oxide 40 % ointment Commonly known as: DESITIN       TAKE these medications    cyanocobalamin 1000 MCG tablet Take 1 tablet (1,000 mcg total) by mouth daily. Start taking on: July 06, 2021   famotidine 40 MG tablet Commonly known as: PEPCID Take 1 tablet (40 mg total) by mouth 2 (two) times daily.   folic acid 1 MG tablet Commonly known as: FOLVITE Take 1 tablet (1 mg total) by mouth daily.   gabapentin 100 MG capsule Commonly known as: NEURONTIN Take 1 capsule (100 mg total) by mouth 3  (three) times daily.   Gerhardt's butt cream Crea Apply 1 application topically 2 (two) times daily.   midodrine 2.5 MG tablet Commonly known as: PROAMATINE Take 1 tablet (2.5 mg total) by mouth 3 (three) times daily with meals.   multivitamin with minerals Tabs tablet Take 1 tablet by mouth daily before breakfast.       ASK your doctor about these medications    magnesium oxide 400 (240 Mg) MG tablet Commonly known as: MAG-OX Take 1 tablet (400 mg total) by mouth 2 (two) times daily for 5 days. Ask about: Should I take this medication?               Durable Medical Equipment  (From admission, onward)           Start     Ordered   07/04/21 1317  For home use only DME Walker rolling  Once       Question Answer Comment  Walker: With 5 Inch Wheels   Patient needs a walker to treat with the following condition Weakness      07/04/21 1317              Discharge Care Instructions  (From admission, onward)           Start  Ordered   07/05/21 0000  Discharge wound care:       Comments: Follow up PCP   07/05/21 0837   07/05/21 0000  Discharge wound care:       Comments: Follow-up outpatient.   07/05/21 1025   06/14/21 0000  Discharge wound care:       Comments: Per wound care instructions   06/14/21 0905            Follow-up Information     [facility] Follow up.   Specialty: Home Health Services Why: Nash Dimmer Contact information: [address] [place] [address] [phone]         [facility].   Specialty: General Practice Why: rolling walker Contact information: [address] [place] [address] [phone]         [facility] in 1 week(s).   Contact information: [address] [facility] [place] [address] [phone]                No Known  Allergies  Consultations: Gastroenterology   Procedures/Studies: No results found. EGD and colonoscopy   Subjective: Patient was seen and examined at bedside.  Overnight events noted.  Patient reports feeling much improved.  Patient has ambulated with some support.  Patient is being discharged home.  Home health services been arranged.  Discharge Exam: Vitals:   07/05/21 0613 07/05/21 0900  BP: (!) 115/94 113/79  Pulse: 93 88  Resp:  16  Temp: 98.3 F (36.8 C) 97.6 F (36.4 C)  SpO2: 100% 99%   Vitals:   07/04/21 1658 07/04/21 1951 07/05/21 0613 07/05/21 0900  BP: 110/79 100/63 (!) 115/94 113/79  Pulse:  78 93 88  Resp: 20   16  Temp: 97.6 F (36.4 C) 98.1 F (36.7 C) 98.3 F (36.8 C) 97.6 F (36.4 C)  TempSrc: Oral Oral Axillary Oral  SpO2: 99% 100% 100% 99%  Weight:   69.4 kg   Height:        General: Alert, oriented, appears comfortable, not in any acute distress. Cardiovascular: RRR, S1/S2 +, no rubs, no gallops Respiratory: CTA bilaterally, no wheezing, no rhonchi Abdominal: Soft, NT, ND, bowel sounds + Extremities: no edema, no cyanosis    The results of significant diagnostics from this hospitalization (including imaging, microbiology, ancillary and laboratory) are listed below for reference.     Microbiology: No results found for this or any previous visit (from the past 240 hour(s)).   Labs: BNP (last 3 results) No results for input(s): BNP in the last 8760 hours. Basic Metabolic Panel: Recent Labs  Lab 06/30/21 0124 07/03/21 0132  NA 133* 132*  K 4.0 4.0  CL 102 103  CO2 22 21*  GLUCOSE 99 113*  BUN 10 11  CREATININE 0.56* 0.50*  CALCIUM 9.3 9.1  MG 1.5*  --   PHOS 4.5  --    Liver Function Tests: Recent Labs  Lab 06/30/21 0124  AST 130*  ALT 116*  ALKPHOS 180*  BILITOT 4.9*  PROT 7.4  ALBUMIN 2.4*   No results for input(s): LIPASE, AMYLASE in the last 168 hours. No results for input(s): AMMONIA in the last 168  hours. CBC: Recent Labs  Lab 06/30/21 0124 07/03/21 0132  WBC 9.7 10.1  HGB 10.5* 10.1*  HCT 33.2* 32.4*  MCV 102.8* 102.9*  PLT 258 289   Cardiac Enzymes: No results for input(s): CKTOTAL, CKMB, CKMBINDEX, TROPONINI in the  last 168 hours. BNP: Invalid input(s): POCBNP CBG: No results for input(s): GLUCAP in the last 168 hours. D-Dimer No results for input(s): DDIMER in the last 72 hours. Hgb A1c No results for input(s): HGBA1C in the last 72 hours. Lipid Profile No results for input(s): CHOL, HDL, LDLCALC, TRIG, CHOLHDL, LDLDIRECT in the last 72 hours. Thyroid function studies No results for input(s): TSH, T4TOTAL, T3FREE, THYROIDAB in the last 72 hours.  Invalid input(s): FREET3 Anemia work up No results for input(s): VITAMINB12, FOLATE, FERRITIN, TIBC, IRON, RETICCTPCT in the last 72 hours. Urinalysis    Component Value Date/Time   COLORURINE YELLOW 01/24/2021 1601   APPEARANCEUR CLEAR 01/24/2021 1601   LABSPEC 1.009 01/24/2021 1601   PHURINE 6.0 01/24/2021 1601   GLUCOSEU NEGATIVE 01/24/2021 1601   HGBUR NEGATIVE 01/24/2021 1601   BILIRUBINUR NEGATIVE 01/24/2021 1601   KETONESUR NEGATIVE 01/24/2021 1601   PROTEINUR NEGATIVE 01/24/2021 1601   NITRITE NEGATIVE 01/24/2021 1601   LEUKOCYTESUR NEGATIVE 01/24/2021 1601   Sepsis Labs Invalid input(s): PROCALCITONIN,  WBC,  LACTICIDVEN Microbiology No results found for this or any previous visit (from the past 240 hour(s)).   Time coordinating discharge: Over 30 minutes  SIGNED:   Cipriano Bunker, MD  [facility] 07/05/2021, 10:28 AM Pager   If 7PM-7AM, please contact night-coverage

## 2021-07-05 NOTE — Discharge Instructions (Signed)
Advised to follow-up with primary care physician in 1 week. Advised to refrain from alcohol.

## 2021-07-05 NOTE — Plan of Care (Signed)
  Problem: Education: Goal: Knowledge of General Education information will improve Description: Including pain rating scale, medication(s)/side effects and non-pharmacologic comfort measures Outcome: Adequate for Discharge   

## 2021-07-05 NOTE — Progress Notes (Signed)
Physical Therapy Treatment Patient Details Name: Dylan Weaver MRN: [id number] DOB: [birth year] Today's Date: 07/05/2021   History of Present Illness Pt is a 55 y.o. male admitted 06/04/21 with weakness, jaundice. Found to have ARF with hepatic encephalopathy, possible GIB. CXR with possible L atelectasis. S/p colonoscopy 9/9 which showed multiple polyps, mild diverticulosis. PMH includes ETOH abuse.    PT Comments    Pt demonstrates independence with bed mobility and transfers. Supervision provided for ambulation 400' without AD, with occasional use of handrail in hallway. No overt LOB noted. No physical assist needed. Plan is for d/c home today. All goals met.     Recommendations for follow up therapy are one component of a multi-disciplinary discharge planning process, led by the attending physician.  Recommendations may be updated based on patient status, additional functional criteria and insurance authorization.  Follow Up Recommendations  Home health PT;Supervision - Intermittent     Equipment Recommendations  Rolling walker with 5" wheels    Recommendations for Other Services       Precautions / Restrictions Precautions Precautions: Fall     Mobility  Bed Mobility Overal bed mobility: Independent                  Transfers Overall transfer level: Independent Equipment used: None                Ambulation/Gait Ambulation/Gait assistance: Supervision Gait Distance (Feet): 400 Feet Assistive device: None Gait Pattern/deviations: Decreased stride length;Step-through pattern;Drifts right/left Gait velocity: WFL Gait velocity interpretation: >2.62 ft/sec, indicative of community ambulatory General Gait Details: occasional use of handrail in hallway, no physical assist needed, no overt LOB noted   Stairs             Wheelchair Mobility    Modified Rankin (Stroke Patients Only)       Balance Overall balance assessment: Needs  assistance Sitting-balance support: No upper extremity supported;Feet supported Sitting balance-Leahy Scale: Good     Standing balance support: During functional activity;No upper extremity supported Standing balance-Leahy Scale: Good                              Cognition Arousal/Alertness: Awake/alert Behavior During Therapy: WFL for tasks assessed/performed Overall Cognitive Status: Within Functional Limits for tasks assessed                                 General Comments: deficits in higher level cognitive skills      Exercises      General Comments        Pertinent Vitals/Pain Pain Assessment: No/denies pain    Home Living                      Prior Function            PT Goals (current goals can now be found in the care plan section) Acute Rehab PT Goals Patient Stated Goal: home today Progress towards PT goals: Progressing toward goals    Frequency    Min 3X/week      PT Plan Current plan remains appropriate    Co-evaluation              AM-PAC PT "6 Clicks" Mobility   Outcome Measure  Help needed turning from your back to your side while in a flat bed without using bedrails?: None Help  needed moving from lying on your back to sitting on the side of a flat bed without using bedrails?: None Help needed moving to and from a bed to a chair (including a wheelchair)?: None Help needed standing up from a chair using your arms (e.g., wheelchair or bedside chair)?: None Help needed to walk in hospital room?: A Little Help needed climbing 3-5 steps with a railing? : A Little 6 Click Score: 22    End of Session   Activity Tolerance: Patient tolerated treatment well Patient left: in chair;with call bell/phone within reach Nurse Communication: Mobility status PT Visit Diagnosis: Muscle weakness (generalized) (M62.81);Difficulty in walking, not elsewhere classified (R26.2)     Time: 8307-3543 PT Time  Calculation (min) (ACUTE ONLY): 10 min  Charges:  $Gait Training: 8-22 mins                     Dylan Weaver, PT  Office # [phone] Pager [phone]    Dylan Weaver 07/05/2021, 9:14 AM

## 2021-07-05 NOTE — Progress Notes (Signed)
Discharge instructions (including medications) discussed with and copy provided to patient/caregiver 

## 2021-07-05 NOTE — Progress Notes (Signed)
Patient d/c to home. Order for walker which had not yet arrived. Patient states he has one at home, but does not need a walker and he is not waiting. Attempted to contact case management. Flu vaccine administered prior to d/c. Brynda Rim, RN

## 2021-08-08 ENCOUNTER — Emergency Department (HOSPITAL_COMMUNITY): Payer: No Typology Code available for payment source

## 2021-08-08 ENCOUNTER — Encounter (HOSPITAL_COMMUNITY): Payer: Self-pay

## 2021-08-08 ENCOUNTER — Emergency Department (HOSPITAL_COMMUNITY)
Admission: EM | Admit: 2021-08-08 | Discharge: 2021-08-08 | Disposition: A | Discharge status: Home / Self Care | Payer: No Typology Code available for payment source | Attending: Emergency Medicine | Admitting: Emergency Medicine

## 2021-08-08 ENCOUNTER — Other Ambulatory Visit: Payer: Self-pay

## 2021-08-08 DIAGNOSIS — Z23 Encounter for immunization: Secondary | ICD-10-CM | POA: Insufficient documentation

## 2021-08-08 DIAGNOSIS — S56521A Laceration of other extensor muscle, fascia and tendon at forearm level, right arm, initial encounter: Secondary | ICD-10-CM | POA: Insufficient documentation

## 2021-08-08 DIAGNOSIS — W01198A Fall on same level from slipping, tripping and stumbling with subsequent striking against other object, initial encounter: Secondary | ICD-10-CM | POA: Diagnosis not present

## 2021-08-08 DIAGNOSIS — F17210 Nicotine dependence, cigarettes, uncomplicated: Secondary | ICD-10-CM | POA: Diagnosis not present

## 2021-08-08 DIAGNOSIS — S59911A Unspecified injury of right forearm, initial encounter: Secondary | ICD-10-CM | POA: Diagnosis present

## 2021-08-08 DIAGNOSIS — M795 Residual foreign body in soft tissue: Secondary | ICD-10-CM

## 2021-08-08 MED ORDER — TETANUS-DIPHTH-ACELL PERTUSSIS 5-2.5-18.5 LF-MCG/0.5 IM SUSY
0.5000 mL | PREFILLED_SYRINGE | Freq: Once | INTRAMUSCULAR | Status: AC
  Administered 2021-08-08: 0.5 mL via INTRAMUSCULAR
  Filled 2021-08-08: qty 0.5

## 2021-08-08 MED ORDER — LIDOCAINE HCL (PF) 1 % IJ SOLN
30.0000 mL | Freq: Once | INTRAMUSCULAR | Status: AC
  Administered 2021-08-08: 30 mL via INTRADERMAL
  Filled 2021-08-08: qty 30

## 2021-08-08 NOTE — Discharge Instructions (Signed)
Your wounds were closed to improve healing.  One of your wounds likely has a retained piece of glass.  This is the large wound in the middle of your forearm.  Clean the wounds well with soap and water daily.  Keep a small bandage on the wounds until they heal.  Call the hand surgeon for a follow-up appointment.  They can help you if you have problems with the foreign body in the wound or difficulty moving your hand or wrist.

## 2021-08-08 NOTE — ED Triage Notes (Addendum)
Pt arrived to ED via EMS from home w/ c/o multiple R arm lacerations after falling onto a trash bag containing beer bottles that he was taking to his trash can.  Pt reported having 5-6 beers since 0300. Pt has hx of alcoholism. EMS reports pt w/ very unsteady gait. Estimated blood loss . VSS w/ EMS. Pt received IM fentanyl.

## 2021-08-08 NOTE — ED Provider Notes (Signed)
[facility] EMERGENCY DEPARTMENT Provider Note   CSN: [id number] Arrival date & time: 08/08/21  0840     History Chief Complaint  Patient presents with  . Extremity Laceration    Dylan Weaver is a 55 y.o. male.  HPI He reports injury to right forearm around 3 AM this morning when he was taking trash out.  He states he fell, landing on a trash bag which contained bottles which broke.  He called EMS later this morning who transferred him here for evaluation and treatment.  He was treated with IM fentanyl during transport.  EMS estimated that he lost 150 cc of blood which they visualized at the scene.  Patient is an alcoholic, drinks heavily and follows at the [facility] for chronic care.  He cannot recall his last tetanus booster.  He denies other injuries.  He has not had any recent illnesses including vomiting, diarrhea, chest pain or shortness of breath.  There are no other known active modifying factors.    Past Medical History:  Diagnosis Date  . ETOH abuse     Patient Active Problem List   Diagnosis Date Noted  . Protein-calorie malnutrition, severe 06/14/2021  . Acute liver failure with hepatic coma (HCC) 06/04/2021  . Acute renal failure (ARF) (HCC) 06/04/2021  . SIRS (systemic inflammatory response syndrome) (HCC) 11/29/2020  . Elevated LFTs 11/29/2020  . Intertrigo 11/29/2020  . Prolonged QT interval 11/29/2020  . Malnutrition of moderate degree 09/16/2020  . Alcohol withdrawal (HCC) 09/14/2020  . Alcoholic hepatitis 09/14/2020  . Thrombocytopenia (HCC) 09/14/2020  . Normochromic normocytic anemia 09/14/2020  . Alcohol use disorder, severe, dependence (HCC) 06/19/2020  . Alcohol abuse with intoxication (HCC) 06/19/2020    Past Surgical History:  Procedure Laterality Date  . BIOPSY  06/08/2021   Procedure: BIOPSY;  Surgeon: Imogene Burn, MD;  Location: [facility] ENDOSCOPY;  Service: Endoscopy;;  . BIOPSY  06/10/2021   Procedure: BIOPSY;  Surgeon:  Lynann Bologna, MD;  Location: [facility] ENDOSCOPY;  Service: Gastroenterology;;  . COLONOSCOPY WITH PROPOFOL N/A 06/10/2021   Procedure: COLONOSCOPY WITH PROPOFOL;  Surgeon: Lynann Bologna, MD;  Location: [facility] ENDOSCOPY;  Service: Gastroenterology;  Laterality: N/A;  . ESOPHAGOGASTRODUODENOSCOPY (EGD) WITH PROPOFOL N/A 06/08/2021   Procedure: ESOPHAGOGASTRODUODENOSCOPY (EGD) WITH PROPOFOL;  Surgeon: Imogene Burn, MD;  Location: [facility] ENDOSCOPY;  Service: Endoscopy;  Laterality: N/A;  . NASAL SEPTUM SURGERY    . WRIST SURGERY         Family History  Family history unknown: Yes    Social History   Tobacco Use  . Smoking status: Every Day    Packs/day: 0.50    Types: Cigarettes  . Smokeless tobacco: Never  Vaping Use  . Vaping Use: Never used  Substance Use Topics  . Alcohol use: Yes    Comment: 12 pack a day beer  . Drug use: Never    Home Medications Prior to Admission medications   Medication Sig Start Date End Date Taking? Authorizing Provider  famotidine (PEPCID) 40 MG tablet Take 1 tablet (40 mg total) by mouth 2 (two) times daily. 06/14/21 09/12/21  Marinda Elk, MD  folic acid (FOLVITE) 1 MG tablet Take 1 tablet (1 mg total) by mouth daily. Patient not taking: Reported on 06/05/2021 12/04/20   Berton Mount I, MD  gabapentin (NEURONTIN) 100 MG capsule Take 1 capsule (100 mg total) by mouth 3 (three) times daily. 06/14/21   Marinda Elk, MD  magnesium oxide (MAG-OX) 400 (240 Mg)  MG tablet Take 2 tablets (800 mg total) by mouth 2 (two) times daily. 07/05/21   Cipriano Bunker, MD  midodrine (PROAMATINE) 2.5 MG tablet Take 1 tablet (2.5 mg total) by mouth 3 (three) times daily with meals. 06/14/21   Marinda Elk, MD  Multiple Vitamin (MULTIVITAMIN WITH MINERALS) TABS tablet Take 1 tablet by mouth daily before breakfast. Patient not taking: No sig reported 12/04/20   Berton Mount I, MD  Nystatin (GERHARDT'S BUTT CREAM) CREA Apply 1 application topically 2 (two) times  daily. Patient not taking: No sig reported 12/04/20   Barnetta Chapel, MD  vitamin B-12 1000 MCG tablet Take 1 tablet (1,000 mcg total) by mouth daily. 07/06/21   Cipriano Bunker, MD    Allergies    Patient has no known allergies.  Review of Systems   Review of Systems  All other systems reviewed and are negative.  Physical Exam Updated Vital Signs BP 96/67 (BP Location: Right Arm)   Pulse 91   Temp 98.5 F (36.9 C) (Oral)   Resp 16   Ht 6' (1.829 m)   Wt 79.4 kg   SpO2 98%   BMI 23.73 kg/m   Physical Exam Vitals and nursing note reviewed.  Constitutional:      Appearance: He is well-developed. He is not ill-appearing.  HENT:     Head: Normocephalic and atraumatic.     Right Ear: External ear normal.     Left Ear: External ear normal.  Eyes:     Conjunctiva/sclera: Conjunctivae normal.     Pupils: Pupils are equal, round, and reactive to light.  Neck:     Trachea: Phonation normal.  Cardiovascular:     Rate and Rhythm: Normal rate.  Pulmonary:     Effort: Pulmonary effort is normal.  Abdominal:     General: There is no distension.  Musculoskeletal:        General: Normal range of motion.     Cervical back: Normal range of motion and neck supple.     Comments: Lacerations right ulnar forearm.  3 gaping lacerations of varying lengths.  See procedure note for accurate measurements.  He has intact distal sensation circulation and function.  No active bleeding from the wounds.  No other injuries.  Skin:    General: Skin is warm and dry.  Neurological:     Mental Status: He is alert and oriented to person, place, and time.     Cranial Nerves: No cranial nerve deficit.     Sensory: No sensory deficit.     Motor: No abnormal muscle tone.     Coordination: Coordination normal.  Psychiatric:        Mood and Affect: Mood normal.        Behavior: Behavior normal.        Thought Content: Thought content normal.        Judgment: Judgment normal.    ED Results /  Procedures / Treatments   Labs (all labs ordered are listed, but only abnormal results are displayed) Labs Reviewed - No data to display  EKG None  Radiology DG Forearm Right  Result Date: 08/08/2021 CLINICAL DATA:  Trauma, skin lacerations EXAM: RIGHT FOREARM - 2 VIEW COMPARISON:  None. FINDINGS: No recent fracture or dislocation is seen. There are pockets of air in the soft tissues along with irregularity in the skin along the dorsal aspect suggesting lacerations. In the lateral view, there is thin linear radiopacity in the soft tissues along the dorsal  aspect of mid shaft of ulna. This finding could not be localized in the AP view. IMPRESSION: No fracture or dislocation is seen. Pockets of air in the soft tissues suggest open wound in the skin. There is thin linear radiopacity along the dorsal aspect of mid shaft of ulna seen only in the lateral view. This may be an artifact or suggest foreign body on the skin or in the soft tissues. Electronically Signed   By: Ernie Avena M.D.   On: 08/08/2021 09:51    Procedures .Marland KitchenLaceration Repair  Date/Time: 08/08/2021 12:23 PM Performed by: Mancel Bale, MD Authorized by: Mancel Bale, MD   Consent:    Consent obtained:  Verbal   Risks discussed:  Infection, pain and need for additional repair   Alternatives discussed:  No treatment Universal protocol:    Patient identity confirmed:  Verbally with patient Anesthesia:    Anesthesia method:  Local infiltration   Local anesthetic:  Lidocaine 1% w/o epi Laceration details:    Location: Right mid ulnar forearm.   Length (cm):  5   Depth (mm):  14 Pre-procedure details:    Preparation:  Patient was prepped and draped in usual sterile fashion Exploration:    Limited defect created (wound extended): no     Hemostasis achieved with:  Direct pressure   Imaging outcome: foreign body noted     Wound exploration: wound explored through full range of motion and entire depth of wound  visualized     Wound extent: fascia violated and muscle damage     Wound extent: no nerve damage noted, no tendon damage noted, no underlying fracture noted and no vascular damage noted     Contaminated: no   Treatment:    Area cleansed with:  Povidone-iodine   Amount of cleaning:  Extensive   Irrigation solution:  Sterile saline   Irrigation method:  Pressure wash (50)   Visualized foreign bodies/material removed: no (Unable to recover suspected glass foreign body)     Debridement:  None   Undermining:  None Skin repair:    Repair method:  Sutures   Suture size:  4-0   Suture material:  Prolene   Suture technique:  Simple interrupted   Number of sutures:  5 Approximation:    Approximation:  Loose Repair type:    Repair type:  Complex Post-procedure details:    Dressing:  Non-adherent dressing   Procedure completion:  Tolerated Comments:     His wound had passed through fascia and the muscle bundle.  No significant muscle herniation.  No active bleeding.  Extensive exploration for foreign body, did not recover a foreign body.  Wound irrigated copiously to help clean and recover suspected foreign body.  Patient understands that he has a potential retained foreign body at the time of wound closure. Marland Kitchen.Laceration Repair  Date/Time: 08/08/2021 12:28 PM Performed by: Mancel Bale, MD Authorized by: Mancel Bale, MD   Consent:    Consent obtained:  Verbal   Risks discussed:  Infection and pain   Alternatives discussed:  No treatment Universal protocol:    Patient identity confirmed:  Verbally with patient Anesthesia:    Anesthesia method:  Local infiltration   Local anesthetic:  Lidocaine 1% w/o epi Laceration details:    Location: Right forearm, dorsal ulnar aspect.   Length (cm):  6   Depth (mm):  14 Pre-procedure details:    Preparation:  Patient was prepped and draped in usual sterile fashion and imaging obtained to evaluate for foreign  bodies Exploration:    Limited  defect created (wound extended): no     Hemostasis achieved with:  Direct pressure   Imaging obtained: x-ray     Imaging outcome: foreign body not noted     Wound exploration: wound explored through full range of motion     Wound extent: fascia violated     Wound extent: no foreign bodies/material noted, no muscle damage noted, no nerve damage noted, no tendon damage noted, no underlying fracture noted and no vascular damage noted     Contaminated: no   Treatment:    Area cleansed with:  Povidone-iodine   Amount of cleaning:  Extensive   Irrigation solution:  Sterile water   Irrigation volume:  30 cc   Irrigation method:  Pressure wash   Visualized foreign bodies/material removed: no     Debridement:  None   Scar revision: no   Skin repair:    Repair method:  Sutures   Number of sutures:  7 Approximation:    Approximation:  Loose Repair type:    Repair type:  Complex Post-procedure details:    Dressing:  Non-adherent dressing   Procedure completion:  Tolerated well, no immediate complications .Marland KitchenLaceration Repair  Date/Time: 08/08/2021 12:31 PM Performed by: Mancel Bale, MD Authorized by: Mancel Bale, MD   Consent:    Consent obtained:  Verbal   Consent given by:  Patient   Risks discussed:  Infection and pain   Alternatives discussed:  No treatment Universal protocol:    Patient identity confirmed:  Verbally with patient Anesthesia:    Anesthesia method:  Local infiltration   Local anesthetic:  Lidocaine 1% w/o epi Laceration details:    Location: Right ulnar forearm.   Length (cm):  3.5   Depth (mm):  9 Pre-procedure details:    Preparation:  Patient was prepped and draped in usual sterile fashion Exploration:    Limited defect created (wound extended): no     Hemostasis achieved with:  Direct pressure   Imaging obtained: x-ray     Imaging outcome: foreign body not noted     Wound extent: no fascia violation noted, no foreign bodies/material noted, no  muscle damage noted, no nerve damage noted, no tendon damage noted, no underlying fracture noted and no vascular damage noted     Contaminated: no   Treatment:    Area cleansed with:  Povidone-iodine   Amount of cleaning:  Standard   Irrigation solution:  Sterile saline   Debridement:  None   Undermining:  None Skin repair:    Repair method:  Sutures   Number of sutures:  3 Approximation:    Approximation:  Loose Repair type:    Repair type:  Simple Post-procedure details:    Dressing:  Non-adherent dressing   Procedure completion:  Tolerated well, no immediate complications .Marland KitchenLaceration Repair  Date/Time: 08/08/2021 12:34 PM Performed by: Mancel Bale, MD Authorized by: Mancel Bale, MD   Consent:    Consent obtained:  Verbal   Consent given by:  Patient   Risks discussed:  Infection and pain Universal protocol:    Patient identity confirmed:  Verbally with patient Anesthesia:    Anesthesia method:  Local infiltration   Local anesthetic:  Lidocaine 1% w/o epi Laceration details:    Location: Right ulnar forearm.   Length (cm):  1.5   Depth (mm):  8 Pre-procedure details:    Preparation:  Patient was prepped and draped in usual sterile fashion Exploration:    Limited defect  created (wound extended): no     Hemostasis achieved with:  Direct pressure   Imaging obtained: x-ray     Imaging outcome: foreign body not noted     Wound extent: no fascia violation noted, no foreign bodies/material noted, no muscle damage noted, no nerve damage noted, no tendon damage noted, no underlying fracture noted and no vascular damage noted     Contaminated: no   Treatment:    Area cleansed with:  Povidone-iodine   Amount of cleaning:  Standard   Irrigation solution:  Sterile water   Visualized foreign bodies/material removed: no     Debridement:  None   Undermining:  None Skin repair:    Repair method:  Sutures   Suture size:  4-0   Suture material:  Prolene   Suture  technique:  Simple interrupted   Number of sutures:  1 Approximation:    Approximation:  Loose Repair type:    Repair type:  Simple Post-procedure details:    Dressing:  Non-adherent dressing   Procedure completion:  Tolerated well, no immediate complications   Medications Ordered in ED Medications  Tdap (BOOSTRIX) injection 0.5 mL (0.5 mLs Intramuscular Given 08/08/21 1032)  lidocaine (PF) (XYLOCAINE) 1 % injection 30 mL (30 mLs Intradermal Given by Other 08/08/21 1032)    ED Course  I have reviewed the triage vital signs and the nursing notes.  Pertinent labs & imaging results that were available during my care of the patient were reviewed by me and considered in my medical decision making (see chart for details).    MDM Rules/Calculators/A&P                            Patient Vitals for the past 24 hrs:  BP Temp Temp src Pulse Resp SpO2 Height Weight  08/08/21 1223 96/67 98.5 F (36.9 C) Oral 91 16 98 % -- --  08/08/21 1045 98/63 -- -- 84 17 99 % -- --  08/08/21 1033 101/70 -- -- 86 16 99 % -- --  08/08/21 1030 101/70 -- -- 89 -- 99 % -- --  08/08/21 0915 106/76 -- -- 85 17 100 % -- --  08/08/21 0903 92/65 98.7 F (37.1 C) Oral 77 16 100 % 6' (1.829 m) 79.4 kg  08/08/21 0844 -- -- -- -- -- 99 % -- --    12:35 PM Reevaluation with update and discussion. After initial assessment and treatment, an updated evaluation reveals no further complaints.  He is comfortable.  Findings discussed and questions answered. Mancel Bale   Medical Decision Making:  This patient is presenting for evaluation of right forearm injury, which does require a range of treatment options, and is a complaint that involves a moderate risk of morbidity and mortality. The differential diagnoses include fracture, contusion, laceration, retained foreign body. I decided to review old records, and in summary alcoholic, fell while drinking, injuring right forearm.  Injuries occurred about 6 hours ago.  Patient sober on evaluation in the ED I did not require additional historical information from anyone.  Radiologic Tests Ordered, included right forearm for foreign body.  I independently Visualized: Radiograph images, which show likely glass foreign body and wound of mid part of ulnar forearm.     Critical Interventions-clinical evaluation, radiography, wound repair, observation and reassessment  After These Interventions, the Patient was reevaluated and was found stable for discharge.  Wounds closed.  Unable to locate foreign body in wound suspected  to have a retained glass foreign body, which is the 4.5 cm wound of the mid forearm.  CRITICAL CARE-no Performed by: Mancel Bale  Nursing Notes Reviewed/ Care Coordinated Applicable Imaging Reviewed Interpretation of Laboratory Data incorporated into ED treatment  The patient appears reasonably screened and/or stabilized for discharge and I doubt any other medical condition or other [facility] requiring further screening, evaluation, or treatment in the ED at this time prior to discharge.  Plan: Home Medications-OTC analgesia of choice; Home Treatments-wound care; return here if the recommended treatment, does not improve the symptoms; Recommended follow up-hand surgery follow-up 3 days and.     Final Clinical Impression(s) / ED Diagnoses Final diagnoses:  Laceration of extensor tendon of right forearm, initial encounter  Foreign body (FB) in soft tissue    Rx / DC Orders ED Discharge Orders     None        Mancel Bale, MD 08/08/21 1236

## 2021-08-08 NOTE — ED Notes (Signed)
Reviewed discharge instructions with patient. Follow-up care and wound care reviewed. Patient verbalized understanding. Patient A&Ox4, VSS upon discharge.

## 2021-08-09 ENCOUNTER — Encounter: Payer: No Typology Code available for payment source | Admitting: Gastroenterology

## 2021-08-09 ENCOUNTER — Telehealth: Payer: Self-pay | Admitting: Gastroenterology

## 2021-08-11 IMAGING — DX DG KNEE COMPLETE 4+V*R*
1 series · 4 of 4 positions shown · non-contrast
Comparison: None.

CLINICAL DATA: Right knee pain after fall

EXAM:
RIGHT KNEE - COMPLETE 4+ VIEW

[Series 1: knee · 0.14mm/px · 4 of 4 slices shown]
[im 1/4]
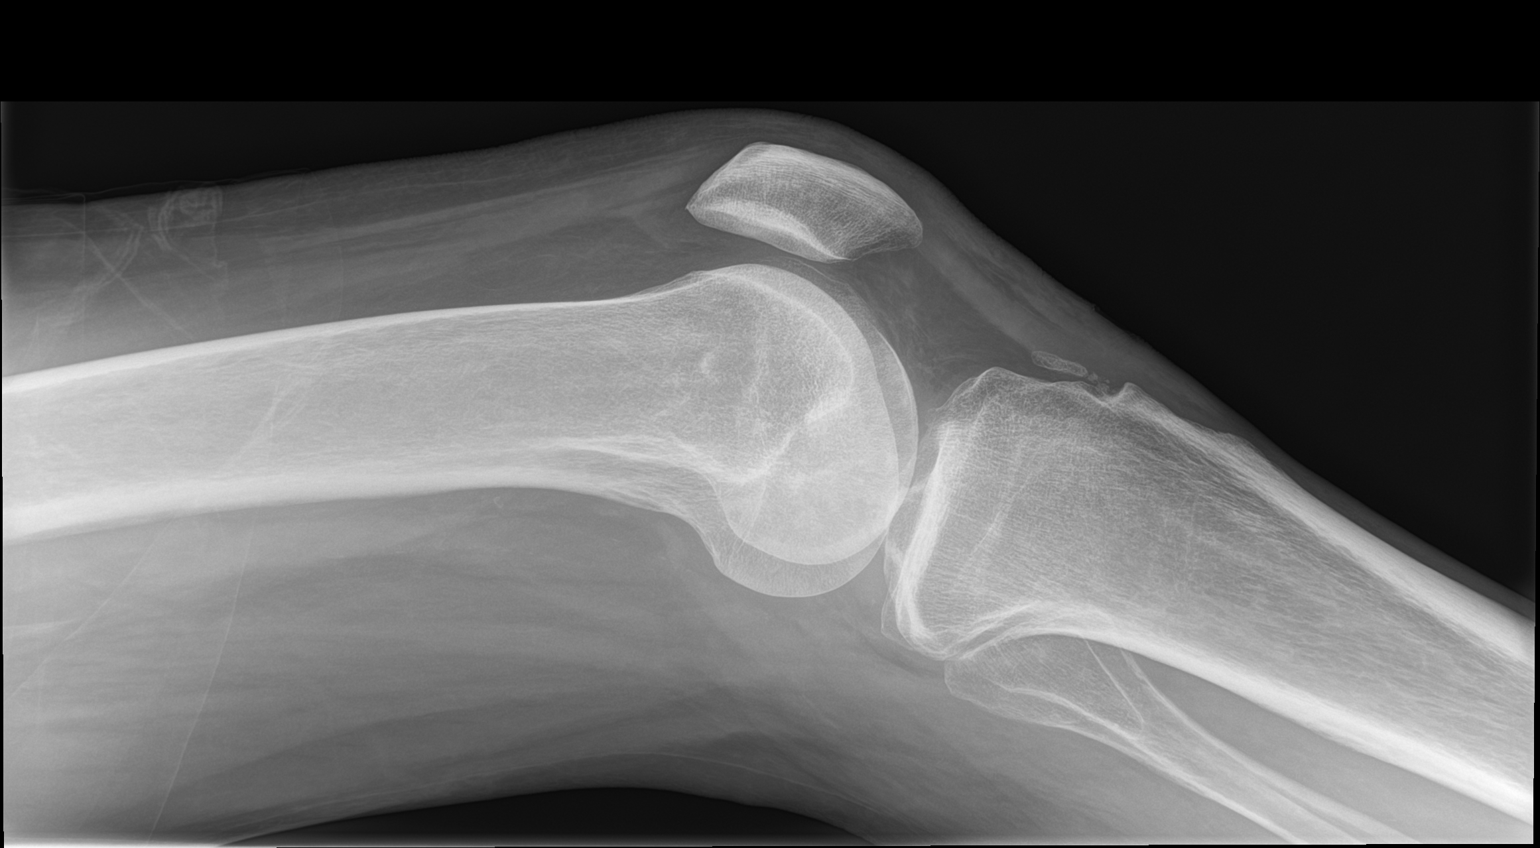
[im 2/4]
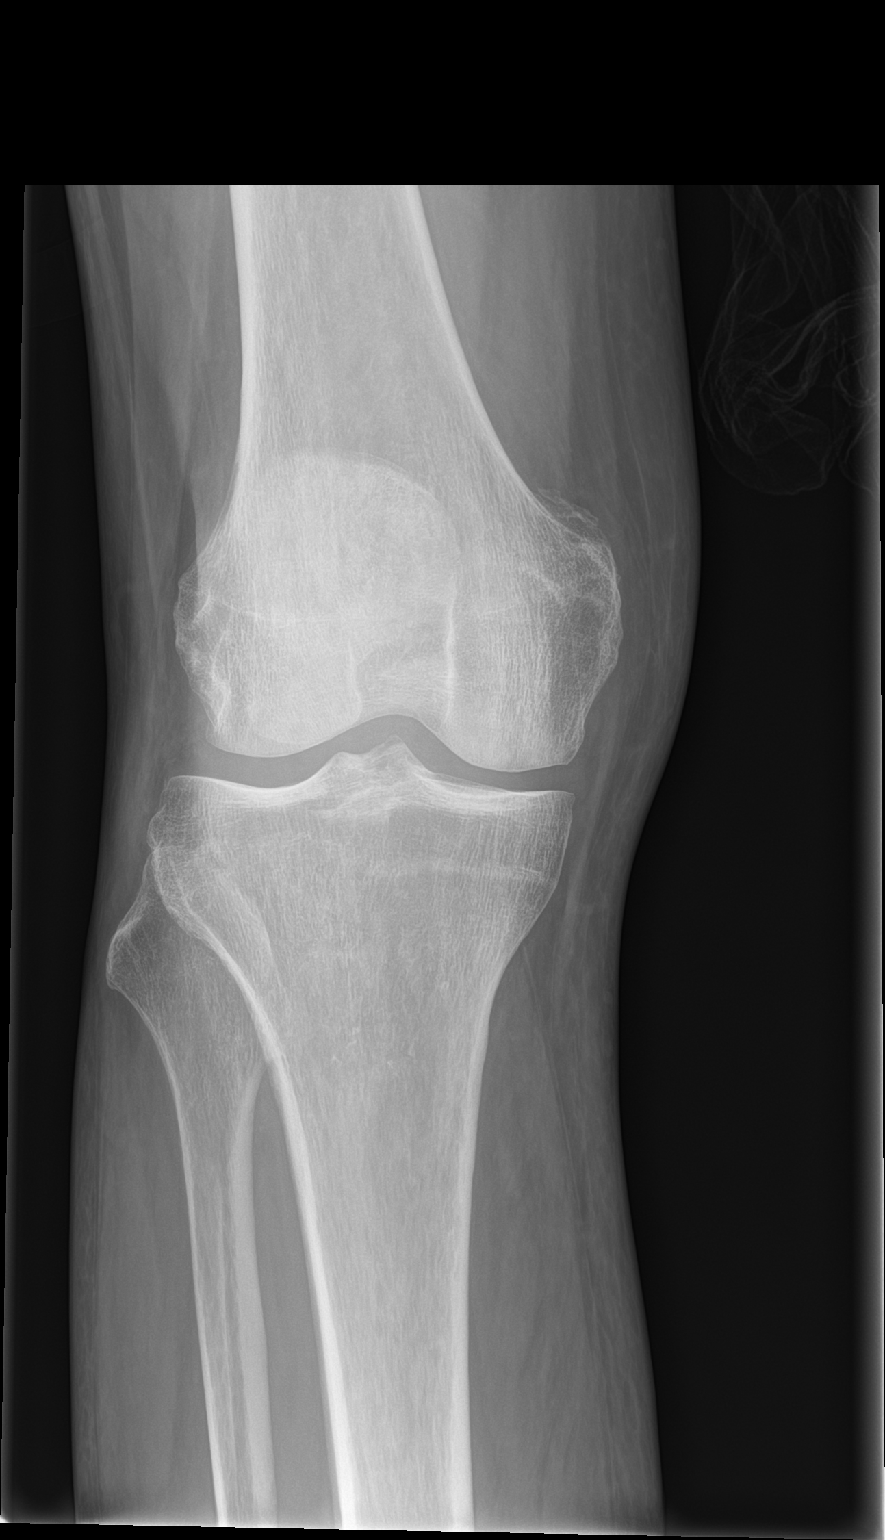
[im 3/4]
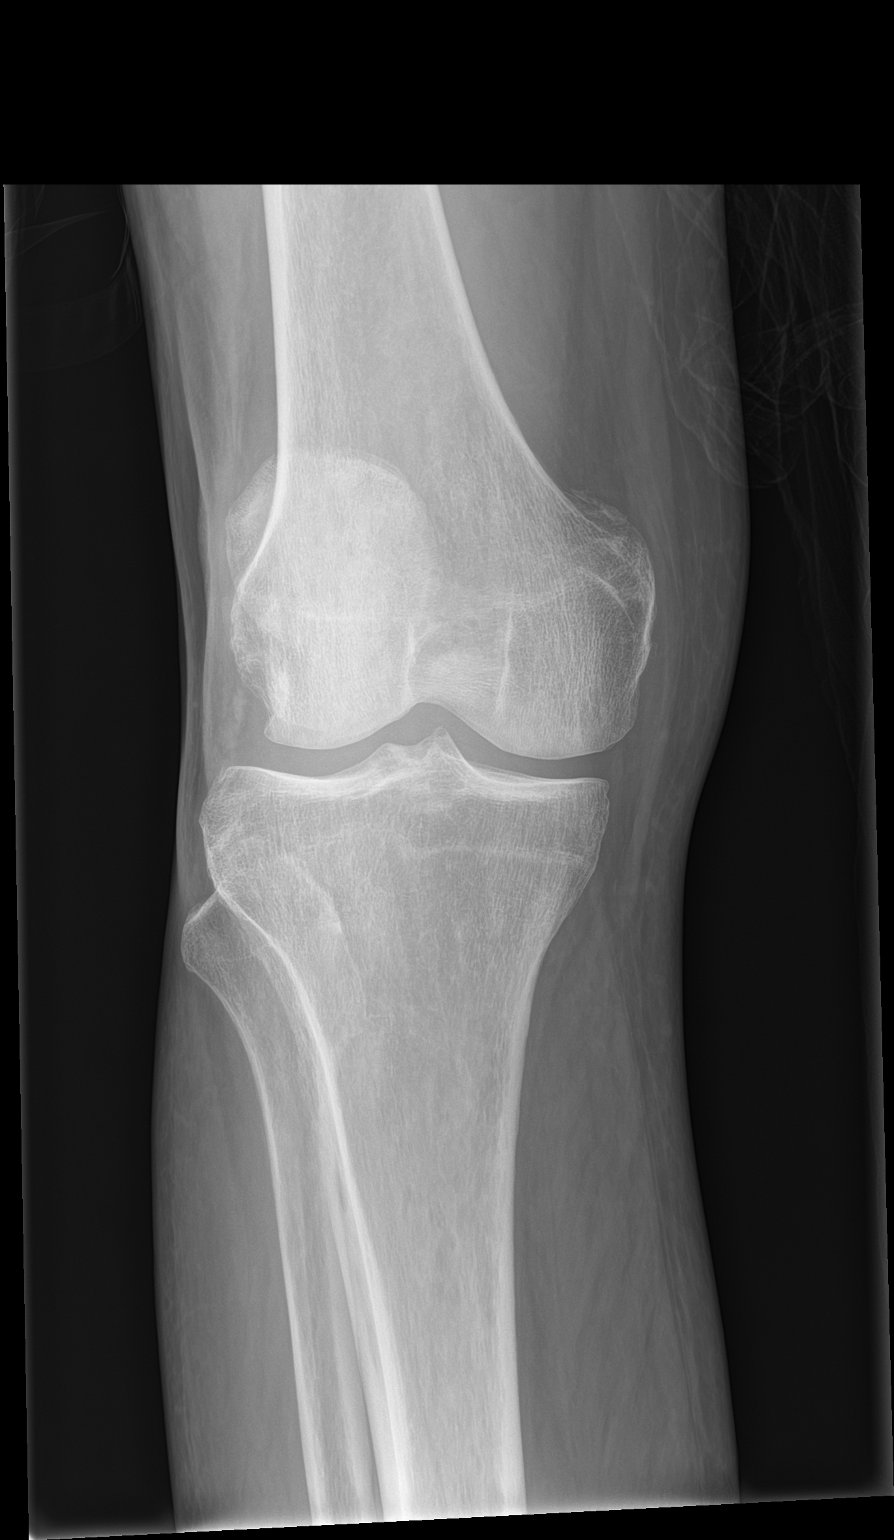
[im 4/4]
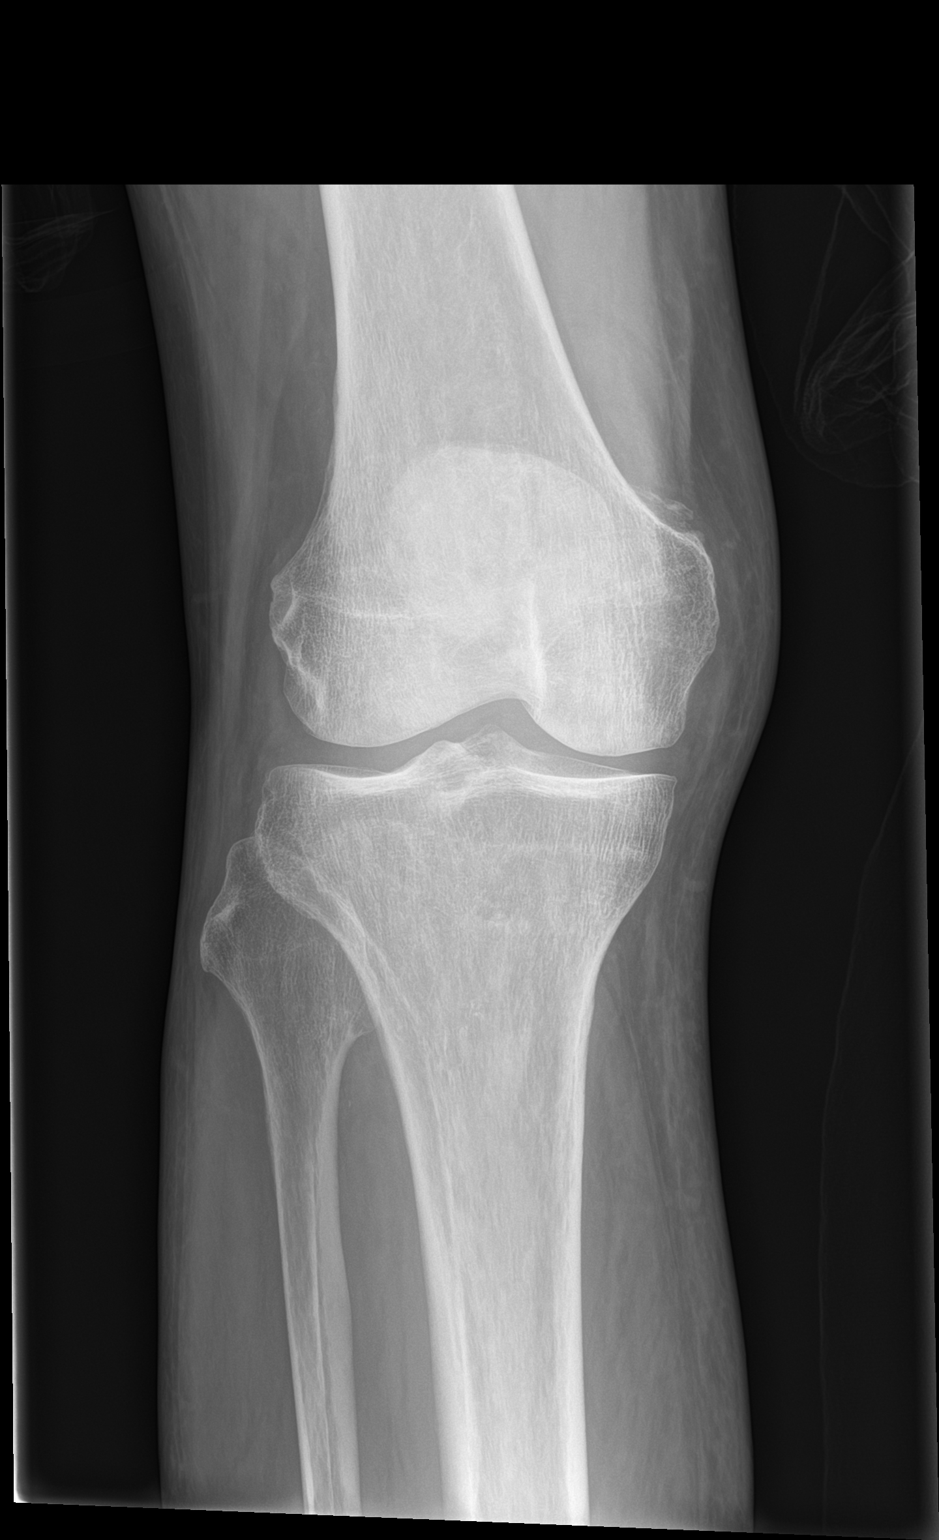

[4 of 4 positions shown; findings below may reference images not displayed]

FINDINGS: No acute bony abnormality. Specifically, no fracture, subluxation,
or dislocation. Joint spaces maintained. No joint effusion.
IMPRESSION: No acute bony abnormality.

## 2021-08-25 ENCOUNTER — Inpatient Hospital Stay (HOSPITAL_COMMUNITY)
Admission: EM | Admit: 2021-08-25 | Discharge: 2021-09-02 | DRG: 871 | Disposition: A | Discharge status: Home Health | Payer: No Typology Code available for payment source | Attending: Family Medicine | Admitting: Family Medicine

## 2021-08-25 ENCOUNTER — Encounter (HOSPITAL_COMMUNITY): Payer: Self-pay | Admitting: Emergency Medicine

## 2021-08-25 ENCOUNTER — Emergency Department (HOSPITAL_COMMUNITY): Payer: No Typology Code available for payment source

## 2021-08-25 DIAGNOSIS — L089 Local infection of the skin and subcutaneous tissue, unspecified: Secondary | ICD-10-CM | POA: Diagnosis present

## 2021-08-25 DIAGNOSIS — L97411 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 breakdown of skin: Secondary | ICD-10-CM

## 2021-08-25 DIAGNOSIS — D6959 Other secondary thrombocytopenia: Secondary | ICD-10-CM | POA: Diagnosis present

## 2021-08-25 DIAGNOSIS — R748 Abnormal levels of other serum enzymes: Secondary | ICD-10-CM

## 2021-08-25 DIAGNOSIS — S40921A Unspecified superficial injury of right upper arm, initial encounter: Secondary | ICD-10-CM

## 2021-08-25 DIAGNOSIS — S41111A Laceration without foreign body of right upper arm, initial encounter: Secondary | ICD-10-CM | POA: Diagnosis present

## 2021-08-25 DIAGNOSIS — E875 Hyperkalemia: Secondary | ICD-10-CM

## 2021-08-25 DIAGNOSIS — L89620 Pressure ulcer of left heel, unstageable: Secondary | ICD-10-CM | POA: Diagnosis present

## 2021-08-25 DIAGNOSIS — E43 Unspecified severe protein-calorie malnutrition: Secondary | ICD-10-CM | POA: Diagnosis present

## 2021-08-25 DIAGNOSIS — L03115 Cellulitis of right lower limb: Secondary | ICD-10-CM | POA: Diagnosis present

## 2021-08-25 DIAGNOSIS — L89310 Pressure ulcer of right buttock, unstageable: Secondary | ICD-10-CM | POA: Diagnosis present

## 2021-08-25 DIAGNOSIS — L2489 Irritant contact dermatitis due to other agents: Secondary | ICD-10-CM | POA: Diagnosis present

## 2021-08-25 DIAGNOSIS — E440 Moderate protein-calorie malnutrition: Secondary | ICD-10-CM | POA: Diagnosis present

## 2021-08-25 DIAGNOSIS — K7460 Unspecified cirrhosis of liver: Secondary | ICD-10-CM

## 2021-08-25 DIAGNOSIS — K7030 Alcoholic cirrhosis of liver without ascites: Secondary | ICD-10-CM | POA: Diagnosis present

## 2021-08-25 DIAGNOSIS — F10929 Alcohol use, unspecified with intoxication, unspecified: Secondary | ICD-10-CM

## 2021-08-25 DIAGNOSIS — A419 Sepsis, unspecified organism: Secondary | ICD-10-CM | POA: Diagnosis not present

## 2021-08-25 DIAGNOSIS — F17210 Nicotine dependence, cigarettes, uncomplicated: Secondary | ICD-10-CM | POA: Diagnosis present

## 2021-08-25 DIAGNOSIS — F10239 Alcohol dependence with withdrawal, unspecified: Secondary | ICD-10-CM | POA: Diagnosis present

## 2021-08-25 DIAGNOSIS — S51819A Laceration without foreign body of unspecified forearm, initial encounter: Secondary | ICD-10-CM

## 2021-08-25 DIAGNOSIS — R627 Adult failure to thrive: Secondary | ICD-10-CM | POA: Diagnosis present

## 2021-08-25 DIAGNOSIS — L03113 Cellulitis of right upper limb: Secondary | ICD-10-CM | POA: Diagnosis present

## 2021-08-25 DIAGNOSIS — W25XXXA Contact with sharp glass, initial encounter: Secondary | ICD-10-CM | POA: Diagnosis present

## 2021-08-25 DIAGNOSIS — E871 Hypo-osmolality and hyponatremia: Secondary | ICD-10-CM

## 2021-08-25 DIAGNOSIS — K7290 Hepatic failure, unspecified without coma: Secondary | ICD-10-CM | POA: Diagnosis present

## 2021-08-25 DIAGNOSIS — M6282 Rhabdomyolysis: Secondary | ICD-10-CM | POA: Diagnosis present

## 2021-08-25 DIAGNOSIS — Z79899 Other long term (current) drug therapy: Secondary | ICD-10-CM

## 2021-08-25 DIAGNOSIS — L0390 Cellulitis, unspecified: Secondary | ICD-10-CM | POA: Diagnosis present

## 2021-08-25 DIAGNOSIS — I739 Peripheral vascular disease, unspecified: Secondary | ICD-10-CM | POA: Diagnosis present

## 2021-08-25 DIAGNOSIS — E872 Acidosis, unspecified: Secondary | ICD-10-CM | POA: Diagnosis present

## 2021-08-25 DIAGNOSIS — I959 Hypotension, unspecified: Secondary | ICD-10-CM

## 2021-08-25 DIAGNOSIS — F10939 Alcohol use, unspecified with withdrawal, unspecified: Secondary | ICD-10-CM | POA: Diagnosis present

## 2021-08-25 DIAGNOSIS — Z720 Tobacco use: Secondary | ICD-10-CM | POA: Diagnosis present

## 2021-08-25 DIAGNOSIS — R6520 Severe sepsis without septic shock: Secondary | ICD-10-CM | POA: Diagnosis present

## 2021-08-25 DIAGNOSIS — Z7401 Bed confinement status: Secondary | ICD-10-CM

## 2021-08-25 DIAGNOSIS — L03119 Cellulitis of unspecified part of limb: Secondary | ICD-10-CM

## 2021-08-25 DIAGNOSIS — Z20822 Contact with and (suspected) exposure to covid-19: Secondary | ICD-10-CM | POA: Diagnosis present

## 2021-08-25 DIAGNOSIS — F1020 Alcohol dependence, uncomplicated: Secondary | ICD-10-CM | POA: Diagnosis present

## 2021-08-25 DIAGNOSIS — K760 Fatty (change of) liver, not elsewhere classified: Secondary | ICD-10-CM

## 2021-08-25 DIAGNOSIS — L89610 Pressure ulcer of right heel, unstageable: Secondary | ICD-10-CM | POA: Diagnosis present

## 2021-08-25 DIAGNOSIS — Z6821 Body mass index (BMI) 21.0-21.9, adult: Secondary | ICD-10-CM

## 2021-08-25 DIAGNOSIS — F10229 Alcohol dependence with intoxication, unspecified: Secondary | ICD-10-CM | POA: Diagnosis present

## 2021-08-25 LAB — CBC WITH DIFFERENTIAL/PLATELET
Abs Immature Granulocytes: 0.04 10*3/uL (ref 0.00–0.07)
Basophils Absolute: 0.1 10*3/uL (ref 0.0–0.1)
Basophils Relative: 1 %
Eosinophils Absolute: 0.3 10*3/uL (ref 0.0–0.5)
Eosinophils Relative: 4 %
HCT: 38.2 % — ABNORMAL LOW (ref 39.0–52.0)
Hemoglobin: 13.3 g/dL (ref 13.0–17.0)
Immature Granulocytes: 1 %
Lymphocytes Relative: 21 %
Lymphs Abs: 1.8 10*3/uL (ref 0.7–4.0)
MCH: 32 pg (ref 26.0–34.0)
MCHC: 34.8 g/dL (ref 30.0–36.0)
MCV: 91.8 fL (ref 80.0–100.0)
Monocytes Absolute: 0.5 10*3/uL (ref 0.1–1.0)
Monocytes Relative: 6 %
Neutro Abs: 5.9 10*3/uL (ref 1.7–7.7)
Neutrophils Relative %: 67 %
Platelets: 259 10*3/uL (ref 150–400)
RBC: 4.16 MIL/uL — ABNORMAL LOW (ref 4.22–5.81)
RDW: 14.8 % (ref 11.5–15.5)
WBC: 8.6 10*3/uL (ref 4.0–10.5)
nRBC: 0 % (ref 0.0–0.2)

## 2021-08-25 LAB — COMPREHENSIVE METABOLIC PANEL
ALT: 50 U/L — ABNORMAL HIGH (ref 0–44)
AST: 126 U/L — ABNORMAL HIGH (ref 15–41)
Albumin: 2.6 g/dL — ABNORMAL LOW (ref 3.5–5.0)
Alkaline Phosphatase: 238 U/L — ABNORMAL HIGH (ref 38–126)
Anion gap: 11 (ref 5–15)
BUN: <5 mg/dL — ABNORMAL LOW (ref 6–20)
CO2: 18 mmol/L — ABNORMAL LOW (ref 22–32)
Calcium: 8 mg/dL — ABNORMAL LOW (ref 8.9–10.3)
Chloride: 93 mmol/L — ABNORMAL LOW (ref 98–111)
Creatinine, Ser: 0.51 mg/dL — ABNORMAL LOW (ref 0.61–1.24)
GFR, Estimated: >60 mL/min (ref >60)
Glucose, Bld: 93 mg/dL (ref 70–99)
Potassium: 5.2 mmol/L — ABNORMAL HIGH (ref 3.5–5.1)
Sodium: 122 mmol/L — ABNORMAL LOW (ref 135–145)
Total Bilirubin: 2.1 mg/dL — ABNORMAL HIGH (ref 0.3–1.2)
Total Protein: 7.5 g/dL (ref 6.5–8.1)

## 2021-08-25 LAB — RESP PANEL BY RT-PCR (FLU A&B, COVID) ARPGX2
Influenza A by PCR: NEGATIVE
Influenza B by PCR: NEGATIVE
SARS Coronavirus 2 by RT PCR: NEGATIVE

## 2021-08-25 LAB — LACTIC ACID, PLASMA: Lactic Acid, Venous: 2.4 mmol/L (ref 0.5–1.9)

## 2021-08-25 LAB — LIPASE, BLOOD: Lipase: 25 U/L (ref 11–51)

## 2021-08-25 LAB — BRAIN NATRIURETIC PEPTIDE: B Natriuretic Peptide: 25.8 pg/mL (ref 0.0–100.0)

## 2021-08-25 LAB — ETHANOL: Alcohol, Ethyl (B): 228 mg/dL — ABNORMAL HIGH (ref <10)

## 2021-08-25 MED ORDER — SODIUM CHLORIDE 0.9 % IV SOLN: INTRAVENOUS | Status: DC

## 2021-08-25 MED ORDER — SODIUM CHLORIDE 0.9 % IV BOLUS
1000.0000 mL | Freq: Once | INTRAVENOUS | Status: AC
  Administered 2021-08-25: 1000 mL via INTRAVENOUS

## 2021-08-25 MED ORDER — CEFAZOLIN SODIUM-DEXTROSE 1-4 GM/50ML-% IV SOLN
1.0000 g | Freq: Once | INTRAVENOUS | Status: AC
  Administered 2021-08-25: 1 g via INTRAVENOUS
  Filled 2021-08-25: qty 50

## 2021-08-25 MED ORDER — SODIUM CHLORIDE 0.9 % IV SOLN
INTRAVENOUS | Status: DC
Start: 2021-08-26 — End: 2021-08-25

## 2021-08-25 NOTE — ED Provider Notes (Signed)
[facility]-EMERGENCY DEPT Provider Note   CSN: [id number] Arrival date & time: 08/25/21  1639     History Chief Complaint  Patient presents with   Failure To Thrive   Leg Swelling    Dylan Weaver is a 55 y.o. male.  Patient with a known history of alcohol abuse.  Patient stated that he had not been drinking at all today.  Came in for redness and bilateral leg swelling and pain in his feet.  Patient alert and functional.  Has wounds on his feet and ankles.  He is not sure how he got them.  Looks like it could be from extensive walking with boots on.      Past Medical History:  Diagnosis Date   ETOH abuse     Patient Active Problem List   Diagnosis Date Noted   Decompensated hepatic cirrhosis (HCC) 08/25/2021   Protein-calorie malnutrition, severe 06/14/2021   Acute liver failure with hepatic coma (HCC) 06/04/2021   Acute renal failure (ARF) (HCC) 06/04/2021   SIRS (systemic inflammatory response syndrome) (HCC) 11/29/2020   Elevated LFTs 11/29/2020   Intertrigo 11/29/2020   Prolonged QT interval 11/29/2020   Malnutrition of moderate degree 09/16/2020   Alcohol withdrawal (HCC) 09/14/2020   Alcoholic hepatitis 09/14/2020   Thrombocytopenia (HCC) 09/14/2020   Normochromic normocytic anemia 09/14/2020   Alcohol use disorder, severe, dependence (HCC) 06/19/2020   Alcohol abuse with intoxication (HCC) 06/19/2020    Past Surgical History:  Procedure Laterality Date   BIOPSY  06/08/2021   Procedure: BIOPSY;  Surgeon: Imogene Burn, MD;  Location: [facility] ENDOSCOPY;  Service: Endoscopy;;   BIOPSY  06/10/2021   Procedure: BIOPSY;  Surgeon: Lynann Bologna, MD;  Location: [facility] ENDOSCOPY;  Service: Gastroenterology;;   COLONOSCOPY WITH PROPOFOL N/A 06/10/2021   Procedure: COLONOSCOPY WITH PROPOFOL;  Surgeon: Lynann Bologna, MD;  Location: [facility] ENDOSCOPY;  Service: Gastroenterology;  Laterality: N/A;   ESOPHAGOGASTRODUODENOSCOPY (EGD) WITH PROPOFOL N/A  06/08/2021   Procedure: ESOPHAGOGASTRODUODENOSCOPY (EGD) WITH PROPOFOL;  Surgeon: Imogene Burn, MD;  Location: [facility] ENDOSCOPY;  Service: Endoscopy;  Laterality: N/A;   NASAL SEPTUM SURGERY     WRIST SURGERY         Family History  Family history unknown: Yes    Social History   Tobacco Use   Smoking status: Every Day    Packs/day: 0.50    Types: Cigarettes   Smokeless tobacco: Never  Vaping Use   Vaping Use: Never used  Substance Use Topics   Alcohol use: Yes    Comment: 12 pack a day beer   Drug use: Never    Home Medications Prior to Admission medications   Medication Sig Start Date End Date Taking? Authorizing Provider  folic acid (FOLVITE) 1 MG tablet Take 1 tablet (1 mg total) by mouth daily. 12/04/20  Yes Barnetta Chapel, MD  Multiple Vitamin (MULTIVITAMIN WITH MINERALS) TABS tablet Take 1 tablet by mouth daily before breakfast. 12/04/20  Yes Barnetta Chapel, MD  famotidine (PEPCID) 40 MG tablet Take 1 tablet (40 mg total) by mouth 2 (two) times daily. Patient not taking: Reported on 08/25/2021 06/14/21 09/12/21  Marinda Elk, MD  gabapentin (NEURONTIN) 100 MG capsule Take 1 capsule (100 mg total) by mouth 3 (three) times daily. Patient not taking: Reported on 08/25/2021 06/14/21   Marinda Elk, MD  magnesium oxide (MAG-OX) 400 (240 Mg) MG tablet Take 2 tablets (800 mg total) by mouth 2 (two) times daily. Patient not taking:  Reported on 08/25/2021 07/05/21   Cipriano Bunker, MD  midodrine (PROAMATINE) 2.5 MG tablet Take 1 tablet (2.5 mg total) by mouth 3 (three) times daily with meals. Patient not taking: Reported on 08/25/2021 06/14/21   Marinda Elk, MD  Nystatin (GERHARDT'S BUTT CREAM) CREA Apply 1 application topically 2 (two) times daily. Patient not taking: Reported on 02/07/2021 12/04/20   Berton Mount I, MD  vitamin B-12 1000 MCG tablet Take 1 tablet (1,000 mcg total) by mouth daily. Patient not taking: Reported on 08/25/2021 07/06/21    Cipriano Bunker, MD    Allergies    Patient has no known allergies.  Review of Systems   Review of Systems  Constitutional:  Negative for chills and fever.  HENT:  Negative for ear pain and sore throat.   Eyes:  Negative for pain and visual disturbance.  Respiratory:  Negative for cough and shortness of breath.   Cardiovascular:  Positive for leg swelling. Negative for chest pain and palpitations.  Gastrointestinal:  Negative for abdominal pain and vomiting.  Genitourinary:  Negative for dysuria and hematuria.  Musculoskeletal:  Negative for arthralgias and back pain.  Skin:  Negative for color change and rash.  Neurological:  Negative for seizures and syncope.  All other systems reviewed and are negative.  Physical Exam Updated Vital Signs BP 101/83   Pulse (!) 102   Temp 98.6 F (37 C) (Oral)   Resp 17   SpO2 97%   Physical Exam Vitals and nursing note reviewed.  Constitutional:      General: He is not in acute distress.    Appearance: Normal appearance. He is well-developed.  HENT:     Head: Normocephalic and atraumatic.  Eyes:     Extraocular Movements: Extraocular movements intact.     Conjunctiva/sclera: Conjunctivae normal.     Pupils: Pupils are equal, round, and reactive to light.  Cardiovascular:     Rate and Rhythm: Normal rate and regular rhythm.     Heart sounds: No murmur heard. Pulmonary:     Effort: Pulmonary effort is normal. No respiratory distress.     Breath sounds: Normal breath sounds.  Abdominal:     General: There is no distension.     Palpations: Abdomen is soft.     Tenderness: There is no abdominal tenderness.  Musculoskeletal:        General: No swelling.     Cervical back: Normal range of motion and neck supple.     Right lower leg: Edema present.     Left lower leg: Edema present.     Comments: Patient with bilateral lower extremity swelling mostly knee down.  But has erythema to the bilateral feet and ankle and distal leg area.   With pitting edema.  Has wounds on the feet mostly the left around side of the great toe.  And then also around the medial ankle area.  Skin:    General: Skin is warm and dry.     Capillary Refill: Capillary refill takes less than 2 seconds.  Neurological:     General: No focal deficit present.     Mental Status: He is alert and oriented to person, place, and time.  Psychiatric:        Mood and Affect: Mood normal.    ED Results / Procedures / Treatments   Labs (all labs ordered are listed, but only abnormal results are displayed) Labs Reviewed  COMPREHENSIVE METABOLIC PANEL - Abnormal; Notable for the following components:  Result Value   Sodium 122 (*)    Potassium 5.2 (*)    Chloride 93 (*)    CO2 18 (*)    BUN <5 (*)    Creatinine, Ser 0.51 (*)    Calcium 8.0 (*)    Albumin 2.6 (*)    AST 126 (*)    ALT 50 (*)    Alkaline Phosphatase 238 (*)    Total Bilirubin 2.1 (*)    All other components within normal limits  LACTIC ACID, PLASMA - Abnormal; Notable for the following components:   Lactic Acid, Venous 2.4 (*)    All other components within normal limits  ETHANOL - Abnormal; Notable for the following components:   Alcohol, Ethyl (B) 228 (*)    All other components within normal limits  CBC WITH DIFFERENTIAL/PLATELET - Abnormal; Notable for the following components:   RBC 4.16 (*)    HCT 38.2 (*)    All other components within normal limits  CULTURE, BLOOD (ROUTINE X 2)  CULTURE, BLOOD (ROUTINE X 2)  RESP PANEL BY RT-PCR (FLU A&B, COVID) ARPGX2  LIPASE, BLOOD  BRAIN NATRIURETIC PEPTIDE  CBC WITH DIFFERENTIAL/PLATELET  AMMONIA  RAPID URINE DRUG SCREEN, HOSP PERFORMED  BASIC METABOLIC PANEL  BASIC METABOLIC PANEL  BASIC METABOLIC PANEL  CK  DIFFERENTIAL  MAGNESIUM  PHOSPHORUS  CREATININE, URINE, RANDOM  OSMOLALITY, URINE  OSMOLALITY  SODIUM, URINE, RANDOM  TSH  URINALYSIS, COMPLETE (UACMP) WITH MICROSCOPIC  PREALBUMIN  LACTIC ACID, PLASMA  LACTIC  ACID, PLASMA    EKG None  Radiology DG Tibia/Fibula Left  Result Date: 08/25/2021 CLINICAL DATA:  Pain after questionable injury.  Swelling. EXAM: LEFT TIBIA AND FIBULA - 2 VIEW COMPARISON:  None. FINDINGS: There is no evidence of fracture or other focal bone lesions. Soft tissues are unremarkable. IMPRESSION: Negative. Electronically Signed   By: Dorise Bullion III M.D.   On: 08/25/2021 19:34   DG Tibia/Fibula Right  Result Date: 08/25/2021 CLINICAL DATA:  Pain.  Questionable injury. EXAM: RIGHT TIBIA AND FIBULA - 2 VIEW COMPARISON:  None. FINDINGS: There is no evidence of fracture or other focal bone lesions. Soft tissues are unremarkable. IMPRESSION: Negative. Electronically Signed   By: Dorise Bullion III M.D.   On: 08/25/2021 19:33   DG Chest Port 1 View  Result Date: 08/25/2021 CLINICAL DATA:  Bilateral leg pain and swelling. EXAM: PORTABLE CHEST 1 VIEW COMPARISON:  06/04/2021 FINDINGS: Lungs are adequately inflated without consolidation or effusion. Cardiomediastinal silhouette and remainder of the chest is unchanged. IMPRESSION: No active disease. Electronically Signed   By: Marin Olp M.D.   On: 08/25/2021 18:58   DG Foot Complete Left  Result Date: 08/25/2021 CLINICAL DATA:  Pain and swell lung after questionable injury. EXAM: LEFT FOOT - COMPLETE 3+ VIEW COMPARISON:  None. FINDINGS: Mild irregularity of the base of the second proximal phalanx on the oblique view. No other acute abnormalities. IMPRESSION: Mild irregularity at the base of the second proximal phalanx could represent a subtle fracture. Recommend clinical correlation for point tenderness in this region. No other abnormalities. Electronically Signed   By: Dorise Bullion III M.D.   On: 08/25/2021 19:35   DG Foot Complete Right  Result Date: 08/25/2021 CLINICAL DATA:  Pain and swelling after possible injury. EXAM: RIGHT FOOT COMPLETE - 3+ VIEW COMPARISON:  None. FINDINGS: There is no evidence of fracture or  dislocation. There is no evidence of arthropathy or other focal bone abnormality. Soft tissues are unremarkable. IMPRESSION: Negative. Electronically  Signed   By: Dorise Bullion III M.D.   On: 08/25/2021 19:37    Procedures Procedures   Medications Ordered in ED Medications  0.9 %  sodium chloride infusion ( Intravenous New Bag/Given 08/25/21 1927)  sodium chloride 0.9 % bolus 1,000 mL (0 mLs Intravenous Stopped 08/25/21 1929)  ceFAZolin (ANCEF) IVPB 1 g/50 mL premix (0 g Intravenous Stopped 08/25/21 2135)    ED Course  I have reviewed the triage vital signs and the nursing notes.  Pertinent labs & imaging results that were available during my care of the patient were reviewed by me and considered in my medical decision making (see chart for details).    MDM Rules/Calculators/A&P                         CRITICAL CARE Performed by: Fredia Sorrow Total critical care time: 35 minutes Critical care time was exclusive of separately billable procedures and treating other patients. Critical care was necessary to treat or prevent imminent or life-threatening deterioration. Critical care was time spent personally by me on the following activities: development of treatment plan with patient and/or surrogate as well as nursing, discussions with consultants, evaluation of patient's response to treatment, examination of patient, obtaining history from patient or surrogate, ordering and performing treatments and interventions, ordering and review of laboratory studies, ordering and review of radiographic studies, pulse oximetry and re-evaluation of patient's condition.  Patient with hyponatremia.  Patient received 1 L of normal saline and then 100 cc an hour.  Patient also with marked alcohol intoxication with blood alcohol in the 280 range.  Showing no signs of withdrawal.  No leukocytosis hemoglobin is normal.  Electrolytes significant for potassium of 5.2 with slight hemolysis.  Sodium was 122.   CO2 was 18.  GFR is greater than 60.  Abnormal liver function test bilirubin is 2.1.  But that is improved from his normal bilirubins of 4 or greater.  Anion gap is normal.  Lipase is normal.  X-rays of both tib-fib's showed no bony abnormalities.  X-ray of the left foot raised some concerns for possible base of second proximal phalanx fracture.  Right foot without any remarkable x-ray findings.  Chest x-ray no active disease.  Patient treated with 1 g of Ancef for the cellulitis.  Also due to the hyponatremia discussed with hospitalist who will admit.  Final Clinical Impression(s) / ED Diagnoses Final diagnoses:  Hyponatremia  Alcoholic intoxication with complication ([place])  Cellulitis of lower extremity, unspecified laterality    Rx / DC Orders ED Discharge Orders     None        Fredia Sorrow, MD 08/25/21 2157

## 2021-08-25 NOTE — H&P (Addendum)
Dylan Weaver [id number] DOB: 08-06-1966 DOA: 08/25/2021     [facility]   Outpatient Specialists:      Patient arrived to ER on 08/25/21 at 1639 Referred by Attending Vanetta MuldersZackowski, Scott, MD   Patient coming from: home Lives alone,       Chief Complaint:   Chief Complaint  Patient presents with   Failure To Thrive   Leg Swelling    HPI: Dylan Weaver is a 55 y.o. male with medical history significant of alcohol abuse ongoing and cirrhosis thrombocytopenia elevated LFTs protein calorie malnutrition    Presented with   bilateral leg swelling redness, and ongoing ETOH abuse Patient reportedly lives alone but did try to get himself a roommate.  When roommate arrived to the house he found that the home had excrement on the floor 911 was called.  Patient himself reports that his legs been hurting.  He does not really want a walk around to go to the bathroom he would rather use a urinal.  When asked how does he get his alcohol he says he calls [employer] and walks to the store to buy alcohol arrives back home and then does not walk from then on preferring to urinate and defecate in place  Patient continues to drink on a regular basis.  Somewhat elusive about amount of drinking seems to be still under influence He also smokes and states that this point he is not interested in quitting.  Has been admitted in the past and October 2022 with hepatic encephalopathy Anemia with hemoglobin down to 5.4 transfused 2 units GI was consulted EGD showed esophagitis but no bleeding there is evidence of portal hypertensive gastropathy and gastritis.  Was able to be discharged to home on 4 October. MDF on admission was 7.6 not a candidate for steroids meld score was 33 Noted to be hyponatremic improved with fluid restriction     Has  been vaccinated against COVID   Initial COVID TEST  NEGATIVE  Lab Results  Component Value Date   SARSCOV2NAA NEGATIVE  08/25/2021   SARSCOV2NAA NEGATIVE 06/04/2021   SARSCOV2NAA NEGATIVE 03/13/2021   SARSCOV2NAA NEGATIVE 02/07/2021     Regarding pertinent Chronic problems:    Liver disease MELD-Na score: 22 at 06/23/2021  5:25 AM   Chronic anemia - baseline hg Hemoglobin & Hematocrit  Recent Labs    06/30/21 0124 07/03/21 0132 08/25/21 1807  HGB 10.5* 10.1* 13.3     While in ER: Noted to have bilateral leg swelling and redness supected cellultitis with some abrasions Abnormal NA down to 122    Enc Vitals Group     BP 08/25/21 1729 111/78     Pulse Rate 08/25/21 1729 91     Resp 08/25/21 1729 17     Temp 08/25/21 1729 98.6 F (37 C)     Temp Source 08/25/21 1729 Oral     SpO2 08/25/21 1729 100 %     Weight --      Height --      Head Circumference --      Peak Flow --      Pain Score 08/25/21 1647 10     Pain Loc --      Pain Edu? --      Excl. in GC? --   TMAX(24)@     _________________________________________ Significant initial  Findings: Abnormal Labs Reviewed  COMPREHENSIVE METABOLIC PANEL - Abnormal; Notable for the following components:      Result  Value   Sodium 122 (*)    Potassium 5.2 (*)    Chloride 93 (*)    CO2 18 (*)    BUN <5 (*)    Creatinine, Ser 0.51 (*)    Calcium 8.0 (*)    Albumin 2.6 (*)    AST 126 (*)    ALT 50 (*)    Alkaline Phosphatase 238 (*)    Total Bilirubin 2.1 (*)    All other components within normal limits  LACTIC ACID, PLASMA - Abnormal; Notable for the following components:   Lactic Acid, Venous 2.4 (*)    All other components within normal limits  ETHANOL - Abnormal; Notable for the following components:   Alcohol, Ethyl (B) 228 (*)    All other components within normal limits  CBC WITH DIFFERENTIAL/PLATELET - Abnormal; Notable for the following components:   RBC 4.16 (*)    HCT 38.2 (*)    All other components within normal limits   ____________________________________________ Ordered    CXR -   NON acute  Left foot Mild  irregularity at the base of the second proximal phalanx could represent a subtle fracture.     ECG: Ordered Personally reviewed by me showing: HR : 103 Rhythm:   Sinus tachycardia    no evidence of ischemic changes QTC 473 The recent clinical data is shown below. Vitals:   08/25/21 1902 08/25/21 1915 08/25/21 2000 08/25/21 2030  BP: 110/63 112/69 113/72 101/83  Pulse: 94 97 (!) 110 (!) 102  Resp: 15 (!) 21 15 17   Temp:      TempSrc:      SpO2: 100% 100% 100% 97%       WBC     Component Value Date/Time   WBC 8.6 08/25/2021 1807   LYMPHSABS 1.8 08/25/2021 1807   MONOABS 0.5 08/25/2021 1807   EOSABS 0.3 08/25/2021 1807   BASOSABS 0.1 08/25/2021 1807     Lactic Acid, Venous    Component Value Date/Time   LATICACIDVEN 2.4 ([place]) 08/25/2021 1741      UA  ordered    Results for orders placed or performed during the hospital encounter of 08/25/21  Resp Panel by RT-PCR (Flu A&B, Covid) Nasopharyngeal Swab     Status: None   Collection Time: 08/25/21  9:33 PM   Specimen: Nasopharyngeal Swab; Nasopharyngeal(NP) swabs in vial transport medium  Result Value Ref Range Status   SARS Coronavirus 2 by RT PCR NEGATIVE NEGATIVE Final         Influenza A by PCR NEGATIVE NEGATIVE Final   Influenza B by PCR NEGATIVE NEGATIVE Final           _______________________________________________ Hospitalist was called for admission for cellulitis, decompensated cirrhosis   The following Work up has been ordered so far:  Orders Placed This Encounter  Procedures   Culture, blood (Routine X 2) w Reflex to ID Panel   Resp Panel by RT-PCR (Flu A&B, Covid) Nasopharyngeal Swab   DG Chest Port 1 View   DG Tibia/Fibula Right   DG Tibia/Fibula Left   DG Foot Complete Left   DG Foot Complete Right   Comprehensive metabolic panel   Lipase, blood   CBC with Differential/Platelet   Lactic acid, plasma   Ethanol   CBC with Differential/Platelet   Brain natriuretic peptide   Cardiac  monitoring   Vital signs   Consult to hospitalist     Following Medications were ordered in ER: Medications  0.9 %  sodium  chloride infusion ( Intravenous New Bag/Given 08/25/21 1927)  sodium chloride 0.9 % bolus 1,000 mL (0 mLs Intravenous Stopped 08/25/21 1929)  ceFAZolin (ANCEF) IVPB 1 g/50 mL premix (0 g Intravenous Stopped 08/25/21 2135)        Consult Orders  (From admission, onward)           Start     Ordered   08/25/21 2125  Consult to hospitalist  Once       Provider:  (Not yet assigned)  Question Answer Comment  Place call to: [facility] [id number] celluitis hyponatremia ETOH   Reason for Consult Admit      08/25/21 2125              OTHER Significant initial  Findings:  labs showing:    Recent Labs  Lab 08/25/21 1706  NA 122*  K 5.2*  CO2 18*  GLUCOSE 93  BUN <5*  CREATININE 0.51*  CALCIUM 8.0*    Cr    stable,    Lab Results  Component Value Date   CREATININE 0.51 (L) 08/25/2021   CREATININE 0.50 (L) 07/03/2021   CREATININE 0.56 (L) 06/30/2021    Recent Labs  Lab 08/25/21 1706  AST 126*  ALT 50*  ALKPHOS 238*  BILITOT 2.1*  PROT 7.5  ALBUMIN 2.6*   Lab Results  Component Value Date   CALCIUM 8.0 (L) 08/25/2021   PHOS 4.5 06/30/2021       Plt: Lab Results  Component Value Date   PLT 259 08/25/2021     COVID-19 Labs  No results for input(s): DDIMER, FERRITIN, LDH, CRP in the last 72 hours.  Lab Results  Component Value Date   SARSCOV2NAA NEGATIVE 06/04/2021   SARSCOV2NAA NEGATIVE 03/13/2021   SARSCOV2NAA NEGATIVE 02/07/2021   SARSCOV2NAA NEGATIVE 11/29/2020       Recent Labs  Lab 08/25/21 1807  WBC 8.6  NEUTROABS 5.9  HGB 13.3  HCT 38.2*  MCV 91.8  PLT 259    HG/HCT   stable,       Component Value Date/Time   HGB 13.3 08/25/2021 1807   HCT 38.2 (L) 08/25/2021 1807   MCV 91.8 08/25/2021 1807     Recent Labs  Lab 08/25/21 1706  LIPASE 25   No results for input(s): AMMONIA in the last 168  hours.    BNP (last 3 results) Recent Labs    08/25/21 1805  BNP 25.8       Cultures:    Component Value Date/Time   SDES BLOOD RIGHT HAND 06/05/2021 0207   SPECREQUEST  06/05/2021 0207    BOTTLES DRAWN AEROBIC AND ANAEROBIC Blood Culture adequate volume   CULT  06/05/2021 0207    NO GROWTH 5 DAYS Performed at [facility], [address] [place], [place] [address]    REPTSTATUS 06/10/2021 FINAL 06/05/2021 0207     Radiological Exams on Admission: DG Tibia/Fibula Left  Result Date: 08/25/2021 CLINICAL DATA:  Pain after questionable injury.  Swelling. EXAM: LEFT TIBIA AND FIBULA - 2 VIEW COMPARISON:  None. FINDINGS: There is no evidence of fracture or other focal bone lesions. Soft tissues are unremarkable. IMPRESSION: Negative. Electronically Signed   By: Gerome Sam III M.D.   On: 08/25/2021 19:34   DG Tibia/Fibula Right  Result Date: 08/25/2021 CLINICAL DATA:  Pain.  Questionable injury. EXAM: RIGHT TIBIA AND FIBULA - 2 VIEW COMPARISON:  None. FINDINGS: There is no evidence of fracture or other focal bone lesions. Soft tissues  are unremarkable. IMPRESSION: Negative. Electronically Signed   By: Dorise Bullion III M.D.   On: 08/25/2021 19:33   DG Chest Port 1 View  Result Date: 08/25/2021 CLINICAL DATA:  Bilateral leg pain and swelling. EXAM: PORTABLE CHEST 1 VIEW COMPARISON:  06/04/2021 FINDINGS: Lungs are adequately inflated without consolidation or effusion. Cardiomediastinal silhouette and remainder of the chest is unchanged. IMPRESSION: No active disease. Electronically Signed   By: Marin Olp M.D.   On: 08/25/2021 18:58   DG Foot Complete Left  Result Date: 08/25/2021 CLINICAL DATA:  Pain and swell lung after questionable injury. EXAM: LEFT FOOT - COMPLETE 3+ VIEW COMPARISON:  None. FINDINGS: Mild irregularity of the base of the second proximal phalanx on the oblique view. No other acute abnormalities. IMPRESSION: Mild irregularity at the base of the  second proximal phalanx could represent a subtle fracture. Recommend clinical correlation for point tenderness in this region. No other abnormalities. Electronically Signed   By: Dorise Bullion III M.D.   On: 08/25/2021 19:35   DG Foot Complete Right  Result Date: 08/25/2021 CLINICAL DATA:  Pain and swelling after possible injury. EXAM: RIGHT FOOT COMPLETE - 3+ VIEW COMPARISON:  None. FINDINGS: There is no evidence of fracture or dislocation. There is no evidence of arthropathy or other focal bone abnormality. Soft tissues are unremarkable. IMPRESSION: Negative. Electronically Signed   By: Dorise Bullion III M.D.   On: 08/25/2021 19:37   _______________________________________________________________________________________________________ Latest  Blood pressure 101/83, pulse (!) 102, temperature 98.6 F (37 C), temperature source Oral, resp. rate 17, SpO2 97 %.   Review of Systems:    Pertinent positives include:   fatigue,  Bilateral lower extremity swelling   Constitutional:  No weight loss, night sweats, Fevers, chills,weight loss  HEENT:  No headaches, Difficulty swallowing,Tooth/dental problems,Sore throat,  No sneezing, itching, ear ache, nasal congestion, post nasal drip,  Cardio-vascular:  No chest pain, Orthopnea, PND, anasarca, dizziness, palpitations.no  GI:  No heartburn, indigestion, abdominal pain, nausea, vomiting, diarrhea, change in bowel habits, loss of appetite, melena, blood in stool, hematemesis Resp:  no shortness of breath at rest. No dyspnea on exertion, No excess mucus, no productive cough, No non-productive cough, No coughing up of blood.No change in color of mucus.No wheezing. Skin:  no rash or lesions. No jaundice GU:  no dysuria, change in color of urine, no urgency or frequency. No straining to urinate.  No flank pain.  Musculoskeletal:  No joint pain or no joint swelling. No decreased range of motion. No back pain.  Psych:  No change in mood or  affect. No depression or anxiety. No memory loss.  Neuro: no localizing neurological complaints, no tingling, no weakness, no double vision, no gait abnormality, no slurred speech, no confusion  All systems reviewed and apart from [place] all are negative _______________________________________________________________________________________________ Past Medical History:   Past Medical History:  Diagnosis Date   ETOH abuse       Past Surgical History:  Procedure Laterality Date   BIOPSY  06/08/2021   Procedure: BIOPSY;  Surgeon: Sharyn Creamer, MD;  Location: [facility] ENDOSCOPY;  Service: Endoscopy;;   BIOPSY  06/10/2021   Procedure: BIOPSY;  Surgeon: Jackquline Denmark, MD;  Location: [facility] ENDOSCOPY;  Service: Gastroenterology;;   COLONOSCOPY WITH PROPOFOL N/A 06/10/2021   Procedure: COLONOSCOPY WITH PROPOFOL;  Surgeon: Jackquline Denmark, MD;  Location: [facility];  Service: Gastroenterology;  Laterality: N/A;   ESOPHAGOGASTRODUODENOSCOPY (EGD) WITH PROPOFOL N/A 06/08/2021   Procedure: ESOPHAGOGASTRODUODENOSCOPY (EGD) WITH PROPOFOL;  Surgeon:  Sharyn Creamer, MD;  Location: [facility] ENDOSCOPY;  Service: Endoscopy;  Laterality: N/A;   NASAL SEPTUM SURGERY     WRIST SURGERY      Social History:  Ambulatory  bed bound     reports that he has been smoking cigarettes. He has been smoking an average of .5 packs per day. He has never used smokeless tobacco. He reports current alcohol use. He reports that he does not use drugs.    Family History:   Family History  Problem Relation Age of Onset   Hypertension Other    ______________________________________________________________________________________________ Allergies: No Known Allergies   Prior to Admission medications   Medication Sig Start Date End Date Taking? Authorizing Provider  folic acid (FOLVITE) 1 MG tablet Take 1 tablet (1 mg total) by mouth daily. 12/04/20  Yes Bonnell Public, MD  Multiple Vitamin (MULTIVITAMIN WITH MINERALS) TABS tablet  Take 1 tablet by mouth daily before breakfast. 12/04/20  Yes Bonnell Public, MD  famotidine (PEPCID) 40 MG tablet Take 1 tablet (40 mg total) by mouth 2 (two) times daily. Patient not taking: Reported on 08/25/2021 06/14/21 09/12/21  Charlynne Cousins, MD  gabapentin (NEURONTIN) 100 MG capsule Take 1 capsule (100 mg total) by mouth 3 (three) times daily. Patient not taking: Reported on 08/25/2021 06/14/21   Charlynne Cousins, MD  magnesium oxide (MAG-OX) 400 (240 Mg) MG tablet Take 2 tablets (800 mg total) by mouth 2 (two) times daily. Patient not taking: Reported on 08/25/2021 07/05/21   Shawna Clamp, MD  midodrine (PROAMATINE) 2.5 MG tablet Take 1 tablet (2.5 mg total) by mouth 3 (three) times daily with meals. Patient not taking: Reported on 08/25/2021 06/14/21   Charlynne Cousins, MD  Nystatin (GERHARDT'S BUTT CREAM) CREA Apply 1 application topically 2 (two) times daily. Patient not taking: Reported on 02/07/2021 12/04/20   Dana Allan I, MD  vitamin B-12 1000 MCG tablet Take 1 tablet (1,000 mcg total) by mouth daily. Patient not taking: Reported on 08/25/2021 07/06/21   Shawna Clamp, MD    ___________________________________________________________________________________________________ Physical Exam: Vitals with BMI 08/25/2021 08/25/2021 08/25/2021  Height - - -  Weight - - -  BMI - - -  Systolic [id number]  Diastolic 83 72 69  Pulse [id number] 110 97  Some encounter information is confidential and restricted. Go to Review Flowsheets activity to see all data.     1. General:  in No  Acute distress    Chronically ill   -appearing 2. Psychological: Alert and   Oriented 3. Head/ENT:    Dry Mucous Membranes                          Head Non traumatic, neck supple                         Poor Dentition 4. SKIN:  decreased Skin turgor,  Skin  Dry extensive wounds noted including Sutures from prior lacerations that has never been removed. Wounds on the lower extremities  as well as irritation from urine around the groin area  5. Heart: Regular rate and rhythm no  Murmur, no Rub or gallop 6. Lungs:  Clear to auscultation bilaterally, no wheezes or crackles   7. Abdomen: Soft,  non-tender, Non distended  bowel sounds present 8. Lower extremities: no clubbing, cyanosis, 1+ edema 9. Neurologically Grossly intact, moving all 4 extremities equally tremulous no asterixis 10. MSK:  Normal range of motion    Chart has been reviewed  ______________________________________________________________________________________________  Assessment/Plan  55 y.o. male with medical history significant of alcohol abuse ongoing and cirrhosis thrombocytopenia elevated LFTs protein calorie malnutrition  Admitted for multiple wounds cellulitis decompensated cirrhosis secondary to alcohol abuse  Present on Admission:  Decompensated hepatic cirrhosis ([place]) pt continues to drink, leg edema secondary to low albumin with kidney function currently stable.  Can try low-dose Lasix and see if able to gently diurese Patient will need to have follow-up with GI and stop drinking overall poor prognosis Check ammonia level for baseline.  Although currently patient is still slightly on the influence just mental status difficult ascertain  CK came back a bit elevated at 777 hold off on lasix for now   Protein-calorie malnutrition, severe -check prealbumin nutritional, consult check magnesium and phosphate level   Alcohol use disorder, severe, dependence (HCC)  Alcohol withdrawal (HCC)-currently starting to withdrawal we will order CIWA protocol monitor in stepdown patient at this point not quite related to quit   Cellulitis -with multiple leg wounds.  Order wound care consult.  Admit per cellulitis protocol cover for right now with Ancef check MRSA PCR Plain images show no evidence of or still although there is a possibility of a fracture of one of the toes No Operative indication at this time   .  ER provider could buddy tape for comfort   Tobacco abuse -  - Spoke about importance of quitting spent 5 minutes discussing options for treatment, prior attempts at quitting, and dangers of smoking  -At this point patient is  NOT  interested in quitting  - Does not wish nicotine patch   - nursing tobacco cessation protocol  Hyponatremia chronic but somewhat worse in the setting of alcohol abuse and cirrhosis.  Obtain urine electrolytes and follow  Other plan as per orders.  DVT prophylaxis:  SCD     Code Status:    Code Status: Prior FULL CODE   as per patient   I had personally discussed CODE STATUS with patient       Family Communication:   Family not at  Bedside    Disposition Plan:     likely will need placement for rehabilitation                            Following barriers for discharge:                            Electrolytes corrected                                                        Will need to be able to tolerate PO                            Will likely need home health                                              Would benefit from PT/OT eval prior to DC  Ordered  Transition of care consulted                   Nutrition    consulted                  Wound care  consulted                  Consults called: none  Admission status:  ED Disposition     ED Disposition  Admit   Condition  --   [place]: [place] [[id number]]  Level of Care: Progressive [102]  Admit to Progressive based on following criteria: GI, ENDOCRINE disease patients with GI bleeding, acute liver failure or pancreatitis, stable with diabetic ketoacidosis or thyrotoxicosis (hypothyroid) state.  May place patient in observation at [facility] or [facility] if equivalent level of care is available:: No  Covid Evaluation: Asymptomatic Screening Protocol (No Symptoms)  Diagnosis: Decompensated hepatic cirrhosis [facility])  [id number]  Admitting Physician: Toy Baker [[id number]]  Attending Physician: Toy Baker [[id number]]           Obs     Level of care      progressive  tele indefinitely please discontinue once patient no longer qualifies COVID-19 Labs    Lab Results  Component Value Date   Encinal 08/25/2021     Precautions: admitted as  Covid Negative    PPE: Used by the provider:   N95  eye Goggles,  Gloves     Presten Joost 08/26/2021, 1:34 AM    [facility]     after 2 AM please page floor coverage PA If 7AM-7PM, please contact the day team taking care of the patient using [email or web address]   Patient was evaluated in the context of the global COVID-19 pandemic, which necessitated consideration that the patient might be at risk for infection with the SARS-CoV-2 virus that causes COVID-19. Institutional protocols and algorithms that pertain to the evaluation of patients at risk for COVID-19 are in a state of rapid change based on information released by regulatory bodies including the CDC and federal and state organizations. These policies and algorithms were followed during the patient's care.

## 2021-08-25 NOTE — ED Notes (Signed)
ED TO INPATIENT HANDOFF REPORT  ED Nurse Name and Phone #:   S Name/Age/Gender Dylan Weaver 55 y.o. male Room/Bed: WA10/WA10  Code Status   Code Status: Prior  Home/SNF/Other Home Patient oriented to: self, place, time, and situation Is this baseline? Yes   Triage Complete: Triage complete  Chief Complaint Decompensated hepatic cirrhosis (HCC) [K72.90, K74.60]  Triage Note Patient here from home reporting bilateral leg pain. Hx of alcohol abuse.    Allergies No Known Allergies  Level of Care/Admitting Diagnosis ED Disposition     ED Disposition  Admit   Condition  --   Comment  Hospital Area: [facility] [[id number]]  Level of Care: Progressive [102]  Admit to Progressive based on following criteria: GI, ENDOCRINE disease patients with GI bleeding, acute liver failure or pancreatitis, stable with diabetic ketoacidosis or thyrotoxicosis (hypothyroid) state.  May place patient in observation at [facility] or [facility] if equivalent level of care is available:: No  Covid Evaluation: Asymptomatic Screening Protocol (No Symptoms)  Diagnosis: Decompensated hepatic cirrhosis [facility]) [[id number]]  Admitting Physician: Therisa Doyne [[id number]]  Attending Physician: Therisa Doyne [[id number]]          B Medical/Surgery History Past Medical History:  Diagnosis Date   ETOH abuse    Past Surgical History:  Procedure Laterality Date   BIOPSY  06/08/2021   Procedure: BIOPSY;  Surgeon: Imogene Burn, MD;  Location: [facility] ENDOSCOPY;  Service: Endoscopy;;   BIOPSY  06/10/2021   Procedure: BIOPSY;  Surgeon: Lynann Bologna, MD;  Location: [facility] ENDOSCOPY;  Service: Gastroenterology;;   COLONOSCOPY WITH PROPOFOL N/A 06/10/2021   Procedure: COLONOSCOPY WITH PROPOFOL;  Surgeon: Lynann Bologna, MD;  Location: [facility] ENDOSCOPY;  Service: Gastroenterology;  Laterality: N/A;   ESOPHAGOGASTRODUODENOSCOPY (EGD) WITH PROPOFOL N/A 06/08/2021   Procedure:  ESOPHAGOGASTRODUODENOSCOPY (EGD) WITH PROPOFOL;  Surgeon: Imogene Burn, MD;  Location: [facility] ENDOSCOPY;  Service: Endoscopy;  Laterality: N/A;   NASAL SEPTUM SURGERY     WRIST SURGERY       A IV Location/Drains/Wounds Patient Lines/Drains/Airways Status     Active Line/Drains/Airways     Name Placement date Placement time Site Days   Peripheral IV 08/25/21 20 G 2.5" Anterior;Left;Upper Arm 08/25/21  1809  Arm  less than 1   Wound / Incision (Open or Dehisced) 11/30/20 (MASD) Moisture Associated Skin Damage Buttocks Bilateral;Posterior 11/30/20  0510  Buttocks  268   Wound / Incision (Open or Dehisced) 06/04/21 Skin tear Buttocks Right;Left 06/04/21  2230  Buttocks  82            Intake/Output Last 24 hours  Intake/Output Summary (Last 24 hours) at 08/25/2021 2216 Last data filed at 08/25/2021 2135 Gross per 24 hour  Intake 1050 ml  Output --  Net 1050 ml    Labs/Imaging Results for orders placed or performed during the hospital encounter of 08/25/21 (from the past 48 hour(s))  Comprehensive metabolic panel     Status: Abnormal   Collection Time: 08/25/21  5:06 PM  Result Value Ref Range   Sodium 122 (L) 135 - 145 mmol/L   Potassium 5.2 (H) 3.5 - 5.1 mmol/L    Comment: SLIGHT HEMOLYSIS   Chloride 93 (L) 98 - 111 mmol/L   CO2 18 (L) 22 - 32 mmol/L   Glucose, Bld 93 70 - 99 mg/dL    Comment: Glucose reference range applies only to samples taken after fasting for at least 8 hours.   BUN <5 (L) 6 - 20  mg/dL   Creatinine, Ser 1.61 (L) 0.61 - 1.24 mg/dL   Calcium 8.0 (L) 8.9 - 10.3 mg/dL   Total Protein 7.5 6.5 - 8.1 g/dL   Albumin 2.6 (L) 3.5 - 5.0 g/dL   AST 096 (H) 15 - 41 U/L   ALT 50 (H) 0 - 44 U/L   Alkaline Phosphatase 238 (H) 38 - 126 U/L   Total Bilirubin 2.1 (H) 0.3 - 1.2 mg/dL   GFR, Estimated >04 >54 mL/min    Comment: (NOTE) Calculated using the CKD-EPI Creatinine Equation (2021)    Anion gap 11 5 - 15    Comment: Performed at [facility], [address], [place], [place] [address]  Lipase, blood     Status: None   Collection Time: 08/25/21  5:06 PM  Result Value Ref Range   Lipase 25 11 - 51 U/L    Comment: Performed at [facility], [address], [place], [place] [address]  Lactic acid, plasma     Status: Abnormal   Collection Time: 08/25/21  5:41 PM  Result Value Ref Range   Lactic Acid, Venous 2.4 (HH) 0.5 - 1.9 mmol/L    Comment: CRITICAL RESULT CALLED TO, READ BACK BY AND VERIFIED WITH: PRAY,J. RN @1912  ON 08/25/2021 BY COHEN,K Performed at [facility], [address] [place], [place] [address]   Ethanol     Status: Abnormal   Collection Time: 08/25/21  6:05 PM  Result Value Ref Range   Alcohol, Ethyl (B) 228 (H) <10 mg/dL    Comment: (NOTE) Lowest detectable limit for serum alcohol is 10 mg/dL.  For medical purposes only. Performed at [facility], [address] [place], [place] [address]   Brain natriuretic peptide     Status: None   Collection Time: 08/25/21  6:05 PM  Result Value Ref Range   B Natriuretic Peptide 25.8 0.0 - 100.0 pg/mL    Comment: Performed at [facility], [address] [place], [place] [address]  CBC with Differential/Platelet     Status: Abnormal   Collection Time: 08/25/21  6:07 PM  Result Value Ref Range   WBC 8.6 4.0 - 10.5 K/uL   RBC 4.16 (L) 4.22 - 5.81 MIL/uL   Hemoglobin 13.3 13.0 - 17.0 g/dL   HCT 96.2 (L) 95.2 - 84.1 %   MCV 91.8 80.0 - 100.0 fL   MCH 32.0 26.0 - 34.0 pg   MCHC 34.8 30.0 - 36.0 g/dL   RDW 32.4 40.1 - 02.7 %   Platelets 259 150 - 400 K/uL   nRBC 0.0 0.0 - 0.2 %   Neutrophils Relative % 67 %   Neutro Abs 5.9 1.7 - 7.7 K/uL   Lymphocytes Relative 21 %   Lymphs Abs 1.8 0.7 - 4.0 K/uL   Monocytes Relative 6 %   Monocytes Absolute 0.5 0.1 - 1.0 K/uL   Eosinophils Relative 4 %   Eosinophils Absolute 0.3 0.0 - 0.5 K/uL   Basophils Relative 1 %   Basophils  Absolute 0.1 0.0 - 0.1 K/uL   Immature Granulocytes 1 %   Abs Immature Granulocytes 0.04 0.00 - 0.07 K/uL    Comment: Performed at [facility], [address] [place], [place] [address]   DG Tibia/Fibula Left  Result Date: 08/25/2021 CLINICAL DATA:  Pain after questionable injury.  Swelling. EXAM: LEFT TIBIA AND FIBULA - 2 VIEW COMPARISON:  None. FINDINGS: There is no evidence  of fracture or other focal bone lesions. Soft tissues are unremarkable. IMPRESSION: Negative. Electronically Signed   By: Gerome Sam III M.D.   On: 08/25/2021 19:34   DG Tibia/Fibula Right  Result Date: 08/25/2021 CLINICAL DATA:  Pain.  Questionable injury. EXAM: RIGHT TIBIA AND FIBULA - 2 VIEW COMPARISON:  None. FINDINGS: There is no evidence of fracture or other focal bone lesions. Soft tissues are unremarkable. IMPRESSION: Negative. Electronically Signed   By: Gerome Sam III M.D.   On: 08/25/2021 19:33   DG Chest Port 1 View  Result Date: 08/25/2021 CLINICAL DATA:  Bilateral leg pain and swelling. EXAM: PORTABLE CHEST 1 VIEW COMPARISON:  06/04/2021 FINDINGS: Lungs are adequately inflated without consolidation or effusion. Cardiomediastinal silhouette and remainder of the chest is unchanged. IMPRESSION: No active disease. Electronically Signed   By: Elberta Fortis M.D.   On: 08/25/2021 18:58   DG Foot Complete Left  Result Date: 08/25/2021 CLINICAL DATA:  Pain and swell lung after questionable injury. EXAM: LEFT FOOT - COMPLETE 3+ VIEW COMPARISON:  None. FINDINGS: Mild irregularity of the base of the second proximal phalanx on the oblique view. No other acute abnormalities. IMPRESSION: Mild irregularity at the base of the second proximal phalanx could represent a subtle fracture. Recommend clinical correlation for point tenderness in this region. No other abnormalities. Electronically Signed   By: Gerome Sam III M.D.   On: 08/25/2021 19:35   DG Foot Complete Right  Result Date:  08/25/2021 CLINICAL DATA:  Pain and swelling after possible injury. EXAM: RIGHT FOOT COMPLETE - 3+ VIEW COMPARISON:  None. FINDINGS: There is no evidence of fracture or dislocation. There is no evidence of arthropathy or other focal bone abnormality. Soft tissues are unremarkable. IMPRESSION: Negative. Electronically Signed   By: Gerome Sam III M.D.   On: 08/25/2021 19:37    Pending Labs Unresulted Labs (From admission, onward)     Start     Ordered   08/26/21 0500  Prealbumin  Tomorrow morning,   R        08/25/21 2150   08/25/21 2156  Lactic acid, plasma  STAT Now then every 3 hours,   STAT     Question:  Release to patient  Answer:  Immediate   08/25/21 2155   08/25/21 2151  CK  Add-on,   AD       Question:  Release to patient  Answer:  Immediate   08/25/21 2150   08/25/21 2151  Differential  Add-on,   AD       Question:  Release to patient  Answer:  Immediate   08/25/21 2150   08/25/21 2151  Magnesium  Add-on,   AD       Question:  Release to patient  Answer:  Immediate   08/25/21 2150   08/25/21 2151  Phosphorus  Add-on,   AD       Question:  Release to patient  Answer:  Immediate   08/25/21 2150   08/25/21 2151  Creatinine, urine, random  Once,   R       Question:  Release to patient  Answer:  Immediate   08/25/21 2150   08/25/21 2151  Osmolality, urine  Once,   R       Question:  Release to patient  Answer:  Immediate   08/25/21 2150   08/25/21 2151  Osmolality  Add-on,   AD       Question:  Release to patient  Answer:  Immediate  08/25/21 2150   08/25/21 2151  Sodium, urine, random  Once,   R       Question:  Release to patient  Answer:  Immediate   08/25/21 2150   08/25/21 2151  TSH  Add-on,   AD       Question:  Release to patient  Answer:  Immediate   08/25/21 2150   08/25/21 2151  Urinalysis, Complete w Microscopic Urine, Clean Catch  Once,   R       Question:  Release to patient  Answer:  Immediate   08/25/21 2150   08/25/21 2150  Basic metabolic panel   Now then every 6 hours,   R (with TIMED occurrences)      08/25/21 2149   08/25/21 2148  Urine rapid drug screen (hosp performed)  ONCE - STAT,   STAT        08/25/21 2147   08/25/21 2146  Ammonia  Once,   R        08/25/21 2145   08/25/21 2124  Resp Panel by RT-PCR (Flu A&B, Covid) Nasopharyngeal Swab  (Tier 2 - Symptomatic/asymptomatic)  Once,   STAT        08/25/21 2123   08/25/21 1706  CBC with Differential/Platelet  Once,   STAT        08/25/21 1705   08/25/21 1706  Culture, blood (Routine X 2) w Reflex to ID Panel  BLOOD CULTURE X 2,   R (with STAT occurrences)      08/25/21 1705   Signed and Held  Magnesium  Tomorrow morning,   R        Signed and Held   Signed and Held  Phosphorus  Tomorrow morning,   R        Signed and Held   Signed and Held  CBC WITH DIFFERENTIAL  Tomorrow morning,   R        Signed and Held   Signed and Held  TSH  Tomorrow morning,   R        Signed and Held   Signed and Held  Comprehensive metabolic panel  Tomorrow morning,   R        Signed and Held            Vitals/Pain Today's Vitals   08/25/21 1902 08/25/21 1915 08/25/21 2000 08/25/21 2030  BP: 110/63 112/69 113/72 101/83  Pulse: 94 97 (!) 110 (!) 102  Resp: 15 (!) 21 15 17   Temp:      TempSrc:      SpO2: 100% 100% 100% 97%  PainSc:        Isolation Precautions No active isolations  Medications Medications  0.9 %  sodium chloride infusion ( Intravenous New Bag/Given 08/25/21 1927)  sodium chloride 0.9 % bolus 1,000 mL (0 mLs Intravenous Stopped 08/25/21 1929)  ceFAZolin (ANCEF) IVPB 1 g/50 mL premix (0 g Intravenous Stopped 08/25/21 2135)    Mobility non-ambulatory High fall risk   Focused Assessments    R Recommendations: See Admitting Provider Note  Report given to:   Additional Notes:

## 2021-08-25 NOTE — ED Triage Notes (Signed)
Patient here from home reporting bilateral leg pain. Hx of alcohol abuse.

## 2021-08-26 ENCOUNTER — Inpatient Hospital Stay (HOSPITAL_COMMUNITY): Payer: No Typology Code available for payment source

## 2021-08-26 ENCOUNTER — Other Ambulatory Visit: Payer: Self-pay

## 2021-08-26 ENCOUNTER — Encounter (HOSPITAL_COMMUNITY): Payer: Self-pay | Admitting: Internal Medicine

## 2021-08-26 DIAGNOSIS — L03113 Cellulitis of right upper limb: Secondary | ICD-10-CM | POA: Diagnosis present

## 2021-08-26 DIAGNOSIS — W25XXXA Contact with sharp glass, initial encounter: Secondary | ICD-10-CM | POA: Diagnosis present

## 2021-08-26 DIAGNOSIS — R008 Other abnormalities of heart beat: Secondary | ICD-10-CM | POA: Diagnosis not present

## 2021-08-26 DIAGNOSIS — L089 Local infection of the skin and subcutaneous tissue, unspecified: Secondary | ICD-10-CM | POA: Diagnosis not present

## 2021-08-26 DIAGNOSIS — M6282 Rhabdomyolysis: Secondary | ICD-10-CM | POA: Diagnosis present

## 2021-08-26 DIAGNOSIS — E875 Hyperkalemia: Secondary | ICD-10-CM | POA: Diagnosis present

## 2021-08-26 DIAGNOSIS — K760 Fatty (change of) liver, not elsewhere classified: Secondary | ICD-10-CM | POA: Diagnosis present

## 2021-08-26 DIAGNOSIS — I739 Peripheral vascular disease, unspecified: Secondary | ICD-10-CM | POA: Diagnosis present

## 2021-08-26 DIAGNOSIS — L89620 Pressure ulcer of left heel, unstageable: Secondary | ICD-10-CM | POA: Diagnosis present

## 2021-08-26 DIAGNOSIS — E871 Hypo-osmolality and hyponatremia: Secondary | ICD-10-CM

## 2021-08-26 DIAGNOSIS — L89310 Pressure ulcer of right buttock, unstageable: Secondary | ICD-10-CM | POA: Diagnosis present

## 2021-08-26 DIAGNOSIS — D6959 Other secondary thrombocytopenia: Secondary | ICD-10-CM | POA: Diagnosis present

## 2021-08-26 DIAGNOSIS — F10229 Alcohol dependence with intoxication, unspecified: Secondary | ICD-10-CM | POA: Diagnosis present

## 2021-08-26 DIAGNOSIS — Z6821 Body mass index (BMI) 21.0-21.9, adult: Secondary | ICD-10-CM | POA: Diagnosis not present

## 2021-08-26 DIAGNOSIS — L97411 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 breakdown of skin: Secondary | ICD-10-CM | POA: Diagnosis present

## 2021-08-26 DIAGNOSIS — F10239 Alcohol dependence with withdrawal, unspecified: Secondary | ICD-10-CM | POA: Diagnosis present

## 2021-08-26 DIAGNOSIS — L2489 Irritant contact dermatitis due to other agents: Secondary | ICD-10-CM | POA: Diagnosis present

## 2021-08-26 DIAGNOSIS — Z720 Tobacco use: Secondary | ICD-10-CM | POA: Diagnosis present

## 2021-08-26 DIAGNOSIS — K7030 Alcoholic cirrhosis of liver without ascites: Secondary | ICD-10-CM | POA: Diagnosis present

## 2021-08-26 DIAGNOSIS — A419 Sepsis, unspecified organism: Secondary | ICD-10-CM | POA: Diagnosis present

## 2021-08-26 DIAGNOSIS — L03115 Cellulitis of right lower limb: Secondary | ICD-10-CM | POA: Diagnosis present

## 2021-08-26 DIAGNOSIS — S41111A Laceration without foreign body of right upper arm, initial encounter: Secondary | ICD-10-CM | POA: Diagnosis present

## 2021-08-26 DIAGNOSIS — L89610 Pressure ulcer of right heel, unstageable: Secondary | ICD-10-CM | POA: Diagnosis present

## 2021-08-26 DIAGNOSIS — Z20822 Contact with and (suspected) exposure to covid-19: Secondary | ICD-10-CM | POA: Diagnosis present

## 2021-08-26 DIAGNOSIS — S51811A Laceration without foreign body of right forearm, initial encounter: Secondary | ICD-10-CM

## 2021-08-26 DIAGNOSIS — S40921A Unspecified superficial injury of right upper arm, initial encounter: Secondary | ICD-10-CM

## 2021-08-26 DIAGNOSIS — E43 Unspecified severe protein-calorie malnutrition: Secondary | ICD-10-CM | POA: Diagnosis present

## 2021-08-26 DIAGNOSIS — L0390 Cellulitis, unspecified: Secondary | ICD-10-CM | POA: Diagnosis present

## 2021-08-26 DIAGNOSIS — I959 Hypotension, unspecified: Secondary | ICD-10-CM | POA: Diagnosis present

## 2021-08-26 DIAGNOSIS — E872 Acidosis, unspecified: Secondary | ICD-10-CM | POA: Diagnosis present

## 2021-08-26 LAB — CBC WITH DIFFERENTIAL/PLATELET
Abs Immature Granulocytes: 0.02 10*3/uL (ref 0.00–0.07)
Basophils Absolute: 0.1 10*3/uL (ref 0.0–0.1)
Basophils Relative: 1 %
Eosinophils Absolute: 0.3 10*3/uL (ref 0.0–0.5)
Eosinophils Relative: 4 %
HCT: 40 % (ref 39.0–52.0)
Hemoglobin: 13.5 g/dL (ref 13.0–17.0)
Immature Granulocytes: 0 %
Lymphocytes Relative: 19 %
Lymphs Abs: 1.5 10*3/uL (ref 0.7–4.0)
MCH: 31.3 pg (ref 26.0–34.0)
MCHC: 33.8 g/dL (ref 30.0–36.0)
MCV: 92.8 fL (ref 80.0–100.0)
Monocytes Absolute: 0.5 10*3/uL (ref 0.1–1.0)
Monocytes Relative: 7 %
Neutro Abs: 5.5 10*3/uL (ref 1.7–7.7)
Neutrophils Relative %: 69 %
Platelets: 232 10*3/uL (ref 150–400)
RBC: 4.31 MIL/uL (ref 4.22–5.81)
RDW: 14.4 % (ref 11.5–15.5)
WBC: 7.9 10*3/uL (ref 4.0–10.5)
nRBC: 0 % (ref 0.0–0.2)

## 2021-08-26 LAB — CREATININE, URINE, RANDOM: Creatinine, Urine: 47.48 mg/dL

## 2021-08-26 LAB — BASIC METABOLIC PANEL
Anion gap: 4 — ABNORMAL LOW (ref 5–15)
Anion gap: 8 (ref 5–15)
Anion gap: 6 (ref 5–15)
BUN: <5 mg/dL — ABNORMAL LOW (ref 6–20)
BUN: <5 mg/dL — ABNORMAL LOW (ref 6–20)
BUN: <5 mg/dL — ABNORMAL LOW (ref 6–20)
CO2: 22 mmol/L (ref 22–32)
CO2: 22 mmol/L (ref 22–32)
CO2: 23 mmol/L (ref 22–32)
Calcium: 8 mg/dL — ABNORMAL LOW (ref 8.9–10.3)
Calcium: 8.2 mg/dL — ABNORMAL LOW (ref 8.9–10.3)
Calcium: 8.2 mg/dL — ABNORMAL LOW (ref 8.9–10.3)
Chloride: 104 mmol/L (ref 98–111)
Chloride: 104 mmol/L (ref 98–111)
Chloride: 104 mmol/L (ref 98–111)
Creatinine, Ser: 0.36 mg/dL — ABNORMAL LOW (ref 0.61–1.24)
Creatinine, Ser: 0.6 mg/dL — ABNORMAL LOW (ref 0.61–1.24)
Creatinine, Ser: 0.57 mg/dL — ABNORMAL LOW (ref 0.61–1.24)
GFR, Estimated: >60 mL/min (ref >60)
GFR, Estimated: >60 mL/min (ref >60)
GFR, Estimated: >60 mL/min (ref >60)
Glucose, Bld: 100 mg/dL — ABNORMAL HIGH (ref 70–99)
Glucose, Bld: 123 mg/dL — ABNORMAL HIGH (ref 70–99)
Glucose, Bld: 120 mg/dL — ABNORMAL HIGH (ref 70–99)
Potassium: 3.4 mmol/L — ABNORMAL LOW (ref 3.5–5.1)
Potassium: 3.6 mmol/L (ref 3.5–5.1)
Potassium: 3.8 mmol/L (ref 3.5–5.1)
Sodium: 130 mmol/L — ABNORMAL LOW (ref 135–145)
Sodium: 134 mmol/L — ABNORMAL LOW (ref 135–145)
Sodium: 133 mmol/L — ABNORMAL LOW (ref 135–145)

## 2021-08-26 LAB — RAPID URINE DRUG SCREEN, HOSP PERFORMED
Amphetamines: NOT DETECTED
Barbiturates: NOT DETECTED
Benzodiazepines: NOT DETECTED
Cocaine: NOT DETECTED
Opiates: NOT DETECTED
Tetrahydrocannabinol: NOT DETECTED

## 2021-08-26 LAB — LACTIC ACID, PLASMA
Lactic Acid, Venous: 1.7 mmol/L (ref 0.5–1.9)
Lactic Acid, Venous: 2.2 mmol/L (ref 0.5–1.9)

## 2021-08-26 LAB — URINALYSIS, COMPLETE (UACMP) WITH MICROSCOPIC
Bacteria, UA: NONE SEEN
Bilirubin Urine: NEGATIVE
Glucose, UA: NEGATIVE mg/dL
Hgb urine dipstick: NEGATIVE
Ketones, ur: NEGATIVE mg/dL
Nitrite: NEGATIVE
Protein, ur: NEGATIVE mg/dL
Specific Gravity, Urine: 1.008 (ref 1.005–1.030)
pH: 7 (ref 5.0–8.0)

## 2021-08-26 LAB — OSMOLALITY: Osmolality: 300 mOsm/kg — ABNORMAL HIGH (ref 275–295)

## 2021-08-26 LAB — PHOSPHORUS: Phosphorus: 3.4 mg/dL (ref 2.5–4.6)

## 2021-08-26 LAB — TSH: TSH: 2.231 u[IU]/mL (ref 0.350–4.500)

## 2021-08-26 LAB — AMMONIA: Ammonia: 35 umol/L (ref 9–35)

## 2021-08-26 LAB — COMPREHENSIVE METABOLIC PANEL
ALT: 50 U/L — ABNORMAL HIGH (ref 0–44)
AST: 110 U/L — ABNORMAL HIGH (ref 15–41)
Albumin: 2.6 g/dL — ABNORMAL LOW (ref 3.5–5.0)
Alkaline Phosphatase: 221 U/L — ABNORMAL HIGH (ref 38–126)
Anion gap: 10 (ref 5–15)
BUN: <5 mg/dL — ABNORMAL LOW (ref 6–20)
CO2: 22 mmol/L (ref 22–32)
Calcium: 8.3 mg/dL — ABNORMAL LOW (ref 8.9–10.3)
Chloride: 101 mmol/L (ref 98–111)
Creatinine, Ser: 0.5 mg/dL — ABNORMAL LOW (ref 0.61–1.24)
GFR, Estimated: >60 mL/min (ref >60)
Glucose, Bld: 98 mg/dL (ref 70–99)
Potassium: 3.4 mmol/L — ABNORMAL LOW (ref 3.5–5.1)
Sodium: 133 mmol/L — ABNORMAL LOW (ref 135–145)
Total Bilirubin: 1.2 mg/dL (ref 0.3–1.2)
Total Protein: 7.3 g/dL (ref 6.5–8.1)

## 2021-08-26 LAB — CK
Total CK: 777 U/L — ABNORMAL HIGH (ref 49–397)
Total CK: 417 U/L — ABNORMAL HIGH (ref 49–397)

## 2021-08-26 LAB — PREALBUMIN: Prealbumin: 8.8 mg/dL — ABNORMAL LOW (ref 18–38)

## 2021-08-26 LAB — PROTIME-INR
INR: 1.1 (ref 0.8–1.2)
Prothrombin Time: 14.3 seconds (ref 11.4–15.2)

## 2021-08-26 LAB — OSMOLALITY, URINE: Osmolality, Ur: 315 mOsm/kg (ref 300–900)

## 2021-08-26 LAB — SODIUM, URINE, RANDOM: Sodium, Ur: 103 mmol/L

## 2021-08-26 LAB — MAGNESIUM: Magnesium: 1.5 mg/dL — ABNORMAL LOW (ref 1.7–2.4)

## 2021-08-26 MED ORDER — MAGNESIUM OXIDE -MG SUPPLEMENT 400 (240 MG) MG PO TABS
800.0000 mg | ORAL_TABLET | Freq: Two times a day (BID) | ORAL | Status: DC
  Administered 2021-08-26 – 2021-09-02 (×15): 800 mg via ORAL
  Filled 2021-08-26 (×15): qty 2

## 2021-08-26 MED ORDER — ASCORBIC ACID 500 MG PO TABS
250.0000 mg | ORAL_TABLET | Freq: Two times a day (BID) | ORAL | Status: DC
  Administered 2021-08-26 – 2021-08-31 (×11): 250 mg via ORAL
  Filled 2021-08-26 (×11): qty 1

## 2021-08-26 MED ORDER — MAGNESIUM SULFATE 2 GM/50ML IV SOLN
2.0000 g | Freq: Once | INTRAVENOUS | Status: AC
  Administered 2021-08-26: 2 g via INTRAVENOUS
  Filled 2021-08-26: qty 50

## 2021-08-26 MED ORDER — JUVEN PO PACK
1.0000 | PACK | Freq: Two times a day (BID) | ORAL | Status: DC
  Administered 2021-08-27 – 2021-09-02 (×14): 1 via ORAL
  Filled 2021-08-26 (×15): qty 1

## 2021-08-26 MED ORDER — LORAZEPAM 2 MG/ML IJ SOLN: 1.0000 mg | INTRAMUSCULAR | Status: AC | PRN

## 2021-08-26 MED ORDER — ACETAMINOPHEN 325 MG PO TABS
325.0000 mg | ORAL_TABLET | Freq: Four times a day (QID) | ORAL | Status: DC | PRN
  Administered 2021-08-26: 325 mg via ORAL
  Filled 2021-08-26 (×2): qty 1

## 2021-08-26 MED ORDER — FUROSEMIDE 20 MG PO TABS: 20.0000 mg | ORAL_TABLET | Freq: Every day | ORAL | Status: DC

## 2021-08-26 MED ORDER — FOLIC ACID 1 MG PO TABS
1.0000 mg | ORAL_TABLET | Freq: Every day | ORAL | Status: DC
  Administered 2021-08-26 – 2021-09-02 (×8): 1 mg via ORAL
  Filled 2021-08-26 (×8): qty 1

## 2021-08-26 MED ORDER — ENSURE ENLIVE PO LIQD
237.0000 mL | Freq: Two times a day (BID) | ORAL | Status: DC
  Administered 2021-08-26 – 2021-09-02 (×10): 237 mL via ORAL

## 2021-08-26 MED ORDER — GADOBUTROL 1 MMOL/ML IV SOLN
7.5000 mL | Freq: Once | INTRAVENOUS | Status: AC | PRN
  Administered 2021-08-26: 7.5 mL via INTRAVENOUS

## 2021-08-26 MED ORDER — CEFAZOLIN SODIUM-DEXTROSE 1-4 GM/50ML-% IV SOLN
1.0000 g | Freq: Three times a day (TID) | INTRAVENOUS | Status: AC
  Administered 2021-08-26 – 2021-09-01 (×21): 1 g via INTRAVENOUS
  Filled 2021-08-26 (×22): qty 50

## 2021-08-26 MED ORDER — THIAMINE HCL 100 MG/ML IJ SOLN: 100.0000 mg | Freq: Every day | INTRAMUSCULAR | Status: DC

## 2021-08-26 MED ORDER — SODIUM CHLORIDE 0.9 % IV SOLN: INTRAVENOUS | Status: DC | PRN

## 2021-08-26 MED ORDER — POTASSIUM CHLORIDE CRYS ER 20 MEQ PO TBCR
20.0000 meq | EXTENDED_RELEASE_TABLET | Freq: Once | ORAL | Status: AC
  Administered 2021-08-26: 20 meq via ORAL
  Filled 2021-08-26: qty 1

## 2021-08-26 MED ORDER — ADULT MULTIVITAMIN W/MINERALS CH
1.0000 | ORAL_TABLET | Freq: Every day | ORAL | Status: DC
  Administered 2021-08-26 – 2021-09-02 (×8): 1 via ORAL
  Filled 2021-08-26 (×8): qty 1

## 2021-08-26 MED ORDER — LORAZEPAM 1 MG PO TABS: 1.0000 mg | ORAL_TABLET | ORAL | Status: AC | PRN

## 2021-08-26 MED ORDER — SODIUM CHLORIDE 0.9 % IV SOLN
75.0000 mL/h | INTRAVENOUS | Status: AC
  Administered 2021-08-26: 75 mL/h via INTRAVENOUS

## 2021-08-26 MED ORDER — COLLAGENASE 250 UNIT/GM EX OINT
TOPICAL_OINTMENT | Freq: Every day | CUTANEOUS | Status: DC
  Filled 2021-08-26: qty 30

## 2021-08-26 MED ORDER — ZINC OXIDE 12.8 % EX OINT
TOPICAL_OINTMENT | Freq: Four times a day (QID) | CUTANEOUS | Status: DC
  Administered 2021-09-01: 1 via TOPICAL
  Filled 2021-08-26 (×2): qty 56.7

## 2021-08-26 MED ORDER — THIAMINE HCL 100 MG PO TABS
100.0000 mg | ORAL_TABLET | Freq: Every day | ORAL | Status: DC
  Administered 2021-08-26 – 2021-09-02 (×8): 100 mg via ORAL
  Filled 2021-08-26 (×8): qty 1

## 2021-08-26 MED ORDER — MIDODRINE HCL 5 MG PO TABS
2.5000 mg | ORAL_TABLET | Freq: Three times a day (TID) | ORAL | Status: DC
  Administered 2021-08-26 – 2021-08-31 (×15): 2.5 mg via ORAL
  Filled 2021-08-26 (×14): qty 1

## 2021-08-26 NOTE — Consult Note (Addendum)
HAND SURGERY CONSULTATION  REQUESTING PHYSICIAN: Calvert Cantor, MD  Time called: 13:24 Time arrived: 14:00  Chief Complaint: Right arm wound  HPI: Dylan Weaver is a 55 y.o. male who presents with multiple chronic wounds including bilateral feet and right dorsal forearm. He has a history of significant alcohol and tobacco dependence with decompensated hepatic cerrhosis and severe protein-calorie malnutrition. I was consulted to evaluate the right forearm wound.  He initially injured his arm a 11/7 when he was taking out a bag of trash that contained a broken wine bottle.  The bag broke and the glass cut his arm.  He was seen in the ER where the wound was thoroughly irrigated and closed.  X-ray at that time noted possible retained glass in the wound bed.  ER note states that there was no glass visualized in the wound bed before or after thorough irrigation and closure.  He denies any pain associated with the wound.  He denies any significant drainage.  He denies previous wound healing issues.  He overall reports feeling well.   Hand dominance: Right Occupation: Art gallery manager  Past Medical History:  Diagnosis Date   ETOH abuse    Past Surgical History:  Procedure Laterality Date   BIOPSY  06/08/2021   Procedure: BIOPSY;  Surgeon: Imogene Burn, MD;  Location: [facility] ENDOSCOPY;  Service: Endoscopy;;   BIOPSY  06/10/2021   Procedure: BIOPSY;  Surgeon: Lynann Bologna, MD;  Location: [facility] ENDOSCOPY;  Service: Gastroenterology;;   COLONOSCOPY WITH PROPOFOL N/A 06/10/2021   Procedure: COLONOSCOPY WITH PROPOFOL;  Surgeon: Lynann Bologna, MD;  Location: [facility] ENDOSCOPY;  Service: Gastroenterology;  Laterality: N/A;   ESOPHAGOGASTRODUODENOSCOPY (EGD) WITH PROPOFOL N/A 06/08/2021   Procedure: ESOPHAGOGASTRODUODENOSCOPY (EGD) WITH PROPOFOL;  Surgeon: Imogene Burn, MD;  Location: [facility] ENDOSCOPY;  Service: Endoscopy;  Laterality: N/A;   NASAL SEPTUM SURGERY     WRIST SURGERY     Social History    Socioeconomic History   Marital status: Divorced    Spouse name: Not on file   Number of children: Not on file   Years of education: Not on file   Highest education level: Not on file  Occupational History   Not on file  Tobacco Use   Smoking status: Every Day    Packs/day: 0.50    Types: Cigarettes   Smokeless tobacco: Never  Vaping Use   Vaping Use: Never used  Substance and Sexual Activity   Alcohol use: Yes    Comment: 12 pack a day beer   Drug use: Never   Sexual activity: Not Currently  Other Topics Concern   Not on file  Social History Narrative   Not on file   Social Determinants of Health   Financial Resource Strain: Not on file  Food Insecurity: Not on file  Transportation Needs: Not on file  Physical Activity: Not on file  Stress: Not on file  Social Connections: Not on file   Family History  Problem Relation Age of Onset   Hypertension Other    - negative except otherwise stated in the family history section No Known Allergies Prior to Admission medications   Medication Sig Start Date End Date Taking? Authorizing Provider  folic acid (FOLVITE) 1 MG tablet Take 1 tablet (1 mg total) by mouth daily. 12/04/20  Yes Barnetta Chapel, MD  Multiple Vitamin (MULTIVITAMIN WITH MINERALS) TABS tablet Take 1 tablet by mouth daily before breakfast. 12/04/20  Yes Barnetta Chapel, MD  famotidine (PEPCID) 40 MG  tablet Take 1 tablet (40 mg total) by mouth 2 (two) times daily. Patient not taking: Reported on 08/25/2021 06/14/21 09/12/21  Marinda Elk, MD  gabapentin (NEURONTIN) 100 MG capsule Take 1 capsule (100 mg total) by mouth 3 (three) times daily. Patient not taking: Reported on 08/25/2021 06/14/21   Marinda Elk, MD  magnesium oxide (MAG-OX) 400 (240 Mg) MG tablet Take 2 tablets (800 mg total) by mouth 2 (two) times daily. Patient not taking: Reported on 08/25/2021 07/05/21   Cipriano Bunker, MD  midodrine (PROAMATINE) 2.5 MG tablet Take 1 tablet  (2.5 mg total) by mouth 3 (three) times daily with meals. Patient not taking: Reported on 08/25/2021 06/14/21   Marinda Elk, MD  Nystatin (GERHARDT'S BUTT CREAM) CREA Apply 1 application topically 2 (two) times daily. Patient not taking: Reported on 02/07/2021 12/04/20   Berton Mount I, MD  vitamin B-12 1000 MCG tablet Take 1 tablet (1,000 mcg total) by mouth daily. Patient not taking: Reported on 08/25/2021 07/06/21   Cipriano Bunker, MD      DG Tibia/Fibula Left  Result Date: 08/25/2021 CLINICAL DATA:  Pain after questionable injury.  Swelling. EXAM: LEFT TIBIA AND FIBULA - 2 VIEW COMPARISON:  None. FINDINGS: There is no evidence of fracture or other focal bone lesions. Soft tissues are unremarkable. IMPRESSION: Negative. Electronically Signed   By: Gerome Sam III M.D.   On: 08/25/2021 19:34   DG Tibia/Fibula Right  Result Date: 08/25/2021 CLINICAL DATA:  Pain.  Questionable injury. EXAM: RIGHT TIBIA AND FIBULA - 2 VIEW COMPARISON:  None. FINDINGS: There is no evidence of fracture or other focal bone lesions. Soft tissues are unremarkable. IMPRESSION: Negative. Electronically Signed   By: Gerome Sam III M.D.   On: 08/25/2021 19:33   DG Chest Port 1 View  Result Date: 08/25/2021 CLINICAL DATA:  Bilateral leg pain and swelling. EXAM: PORTABLE CHEST 1 VIEW COMPARISON:  06/04/2021 FINDINGS: Lungs are adequately inflated without consolidation or effusion. Cardiomediastinal silhouette and remainder of the chest is unchanged. IMPRESSION: No active disease. Electronically Signed   By: Elberta Fortis M.D.   On: 08/25/2021 18:58   DG Foot Complete Left  Result Date: 08/25/2021 CLINICAL DATA:  Pain and swell lung after questionable injury. EXAM: LEFT FOOT - COMPLETE 3+ VIEW COMPARISON:  None. FINDINGS: Mild irregularity of the base of the second proximal phalanx on the oblique view. No other acute abnormalities. IMPRESSION: Mild irregularity at the base of the second proximal  phalanx could represent a subtle fracture. Recommend clinical correlation for point tenderness in this region. No other abnormalities. Electronically Signed   By: Gerome Sam III M.D.   On: 08/25/2021 19:35   DG Foot Complete Right  Result Date: 08/25/2021 CLINICAL DATA:  Pain and swelling after possible injury. EXAM: RIGHT FOOT COMPLETE - 3+ VIEW COMPARISON:  None. FINDINGS: There is no evidence of fracture or dislocation. There is no evidence of arthropathy or other focal bone abnormality. Soft tissues are unremarkable. IMPRESSION: Negative. Electronically Signed   By: Gerome Sam III M.D.   On: 08/25/2021 19:37   - pertinent xrays, CT, MRI studies were reviewed and independently interpreted  Positive ROS: All other systems have been reviewed and were otherwise negative with the exception of those mentioned in the HPI and as above.  Physical Exam: General: No acute distress, resting comfortably Cardiovascular: BUE warm and well perfused, nl rate Respiratory: No cyanosis, no use of accessory musculature Skin: Warm and dry, multiple chronic wounds over  dorsal forearm Neurologic: Sensation intact distally   Right Upper Extremity  Vascular: All fingertips are pin with good capillary refill.  No edema or atrophy.  No areas of ischemia or ulceration.  Range of Motion: Elbows - Normal carrying angle with full arc of motion in flexion and extension, full supination and pronation Wrists - Full and symmetric arc of motion Fingers - All fingers demonstrate full, free, and uninhibited ROM   Palpation Focal swelling around the oblique wound at distal 1/3 forearm.  No palpable fluctuance of fluid collection present.   Wound: Oblique, ~6 cm wound at dorsal aspect of distal forearm.  Wound margins erythematous and swollen.  Fibrinous material in wound bed debrided revealing beefy red, healthy appearing tissue underneath.  No active drainage.  Multiple small punctate wounds on dorsum of  forearm without drainage  Sensation: Light tough was intact in both forearms and hands.   Motor: There was no extrinsic or extrinsic muscular atrophy.  All muscle units were functioning independently at 5/5 strength.  Joint Stability: No obvious dislocation, subluxation, or significant laxity in either upper extremity.     Assessment: 55 yo RHD M w/ wound on dorsal aspect of right distal forearm.  Surrounding erythema and swelling consistent with cellulitis.  No palpable fluctuance or fluid collection.  Healthy appearing wound bed after debridement.   Plan: Recommend continued IV abx for right forearm cellulitis around wound No surgical plans for the forearm; no purulent drainage, no palpable fluctuance or discrete fluid collection No need for advanced imaging of forearm Recommend continued daily wound care per wound care nursing; should also optimize nutrition to maximize healing potential Will order repeat x-ray of right forearm Remainder of plan per primary team   Thank you for the consult and the opportunity to see Mr. Dennie Maizes, M.D. [facility] 2:20 PM

## 2021-08-26 NOTE — Evaluation (Signed)
Occupational Therapy Evaluation Patient Details Name: Dylan Weaver MRN: [id number] DOB: April 02, 1966 Today's Date: 08/26/2021   History of Present Illness Dylan Weaver is a 55 y.o. male who presented with bilateral leg swelling redness, and ongoing ETOH abuse. PHMx: alcohol abuse ongoing and cirrhosis thrombocytopenia elevated LFTs protein calorie malnutrition   Clinical Impression   This 55 yo male admitted with above presents to acute OT with PLOF of being Mod I-independent at home (with chart review showing that he was not taking the best care of himself). He currently is Mod A+2 for any mobility up on his feet and needs extensive A for LB ADLs and toileting. He will continue to benefit from acute OT with follow up at SNF.     Recommendations for follow up therapy are one component of a multi-disciplinary discharge planning process, led by the attending physician.  Recommendations may be updated based on patient status, additional functional criteria and insurance authorization.   Follow Up Recommendations  Skilled nursing-short term rehab (<3 hours/day)    Assistance Recommended at Discharge Frequent or constant Supervision/Assistance  Functional Status Assessment  Patient has had a recent decline in their functional status and demonstrates the ability to make significant improvements in function in a reasonable and predictable amount of time.  Equipment Recommendations  None recommended by OT       Precautions / Restrictions Precautions Precautions: Fall Precaution Comments: Bil Heel wounds Restrictions Weight Bearing Restrictions: No      Mobility Bed Mobility Overal bed mobility: Needs Assistance Bed Mobility: Supine to Sit;Sit to Supine     Supine to sit: Min guard;HOB elevated Sit to supine: Min guard;HOB elevated   General bed mobility comments: increased time and VCs for hand placement for up to EOB    Transfers Overall transfer level: Needs  assistance Equipment used: Rolling walker (2 wheels) Transfers: Sit to/from Stand Sit to Stand: Mod assist;+2 physical assistance           General transfer comment: Mod A +2 side step up towards HOB      Balance Overall balance assessment: Needs assistance Sitting-balance support: Bilateral upper extremity supported;Feet supported Sitting balance-Leahy Scale: Poor Sitting balance - Comments: Bottom sore so he does not sit squared up and uses Bil UEs to help with decreasing WB'ing while seated EOB   Standing balance support: Bilateral upper extremity supported;Reliant on assistive device for balance Standing balance-Leahy Scale: Poor                             ADL either performed or assessed with clinical judgement   ADL Overall ADL's : Needs assistance/impaired Eating/Feeding: Independent;Bed level   Grooming: Set up;Bed level   Upper Body Bathing: Set up;Bed level   Lower Body Bathing: Moderate assistance;Bed level   Upper Body Dressing : Set up;Bed level   Lower Body Dressing: Maximal assistance;Bed level   Toilet Transfer: Moderate assistance;+2 for physical assistance;Rolling walker (2 wheels) Toilet Transfer Details (indicate cue type and reason): side step up towards [facility] Toileting- Clothing Manipulation and Hygiene: Total assistance;Bed level               Vision Patient Visual Report: No change from baseline              Pertinent Vitals/Pain Pain Assessment: Faces Faces Pain Scale: Hurts even more Pain Location: Bil feet with WB'ing, and bottom when he sits on EOB Pain Descriptors / Indicators: Aching;Sore;Dull;Guarding Pain  Intervention(s): Limited activity within patient's tolerance;Monitored during session;Repositioned     Hand Dominance Right   Extremity/Trunk Assessment Upper Extremity Assessment Upper Extremity Assessment: Overall WFL for tasks assessed           Communication Communication Communication: No  difficulties   Cognition Arousal/Alertness: Awake/alert Behavior During Therapy: WFL for tasks assessed/performed Overall Cognitive Status: Within Functional Limits for tasks assessed                                                  Home Living Family/patient expects to be discharged to:: Private residence Living Arrangements: Other (Comment) (roommate) Available Help at Discharge:  (roommate but is in and out) Type of Home: House Home Access: Stairs to enter Secretary/administrator of Steps: 4 Entrance Stairs-Rails: None Home Layout: One level     Bathroom Shower/Tub: Chief Strategy Officer: Standard     Home Equipment: Cane - single Librarian, academic (2 wheels)          Prior Functioning/Environment Prior Level of Function : Independent/Modified Independent             Mobility Comments: was using RW pta due to Bil foot pain          OT Problem List: Decreased strength;Decreased range of motion;Impaired balance (sitting and/or standing);Decreased activity tolerance;Pain      OT Treatment/Interventions: Self-care/ADL training;DME and/or AE instruction;Patient/family education;Balance training    OT Goals(Current goals can be found in the care plan section) Acute Rehab OT Goals Patient Stated Goal: to get my feet better and willing to go to rehab before home OT Goal Formulation: With patient Time For Goal Achievement: 09/09/21 Potential to Achieve Goals: Good  OT Frequency: Min 2X/week   Barriers to D/C: Decreased caregiver support          Co-evaluation PT/OT/SLP Co-Evaluation/Treatment: Yes Reason for Co-Treatment: For patient/therapist safety;To address functional/ADL transfers          AM-PAC OT "6 Clicks" Daily Activity     Outcome Measure Help from another person eating meals?: None Help from another person taking care of personal grooming?: A Little Help from another person toileting, which includes using  toliet, bedpan, or urinal?: Total Help from another person bathing (including washing, rinsing, drying)?: A Lot Help from another person to put on and taking off regular upper body clothing?: A Little Help from another person to put on and taking off regular lower body clothing?: Total 6 Click Score: 14   End of Session Equipment Utilized During Treatment: Rolling walker (2 wheels) Nurse Communication: Mobility status (mepelex pads placed on bil heels)  Activity Tolerance: Patient limited by pain Patient left: in bed;with call bell/phone within reach;with bed alarm set  OT Visit Diagnosis: Unsteadiness on feet (R26.81);Other abnormalities of gait and mobility (R26.89);Pain Pain - Right/Left:  (Bil) Pain - part of body: Ankle and joints of foot                Time: 1052-1106 OT Time Calculation (min): 14 min Charges:  OT General Charges $OT Visit: 1 Visit OT Evaluation $OT Eval Moderate Complexity: 1 Mod  Ignacia Palma, OTR/L Acute [employer] Pager [phone] Office [phone]    Evette Georges 08/26/2021, 11:50 AM
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
## 2021-08-26 NOTE — Hospital Course (Signed)
This is a 55 year old male who has a history of alcohol abuse, severe malnutrition, chronic thrombocytopenia and cirrhosis and presents to the hospital for bilateral feet leg pain along with redness and swelling.  In the ED he was noted to have edema from his knee down with pitting.  There are also wounds on his feet and it was felt that he had an infection.  Alcohol level was 280, sodium 122, CO2 18, total bilirubin 2.1.  He was given Ancef and referred for admission. Was last hospitalized from 06/04/2021 through 07/05/2021 for hepatic encephalopathy complicated by AKI, hyponatremia, hypotension and anemia with FOBT positive stools.  EGD revealed esophagitis portal hypertensive gastropathy and gastritis.
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
## 2021-08-26 NOTE — Evaluation (Signed)
Physical Therapy Evaluation Patient Details Name: Onur Mori MRN: [id number] DOB: Mar 06, 1966 Today's Date: 08/26/2021  History of Present Illness  Logen Heintzelman is a 55 y.o. male admitted for multiple wounds, cellulitis, decompensated cirrhosis secondary to alcohol abuse. PHMx: alcohol abuse ongoing and cirrhosis thrombocytopenia, elevated LFTs, protein calorie malnutrition  Clinical Impression  Pt admitted with above diagnosis. Pt currently with functional limitations due to the deficits listed below (see PT Problem List). Pt will benefit from skilled PT to increase their independence and safety with mobility to allow discharge to the venue listed below.  Pt admitted from home and reports hx of falls and use of RW.  Pt reports mobility limited today due to pain however was able to stand and take small steps up [facility] with MOD assist.  Pt appears agreeable for d/c to SNF.       Recommendations for follow up therapy are one component of a multi-disciplinary discharge planning process, led by the attending physician.  Recommendations may be updated based on patient status, additional functional criteria and insurance authorization.  Follow Up Recommendations Skilled nursing-short term rehab (<3 hours/day)    Assistance Recommended at Discharge Intermittent Supervision/Assistance  Functional Status Assessment Patient has had a recent decline in their functional status and demonstrates the ability to make significant improvements in function in a reasonable and predictable amount of time.  Equipment Recommendations  None recommended by PT    Recommendations for Other Services       Precautions / Restrictions Precautions Precautions: Fall Precaution Comments: Bil Heel wounds, MASD periarea Restrictions Weight Bearing Restrictions: No      Mobility  Bed Mobility Overal bed mobility: Needs Assistance Bed Mobility: Supine to Sit;Sit to Supine     Supine to sit: Min  guard;HOB elevated Sit to supine: Min guard;HOB elevated   General bed mobility comments: increased time and VCs for hand placement for up to EOB    Transfers Overall transfer level: Needs assistance Equipment used: Rolling walker (2 wheels) Transfers: Sit to/from Stand Sit to Stand: Mod assist;+2 physical assistance           General transfer comment: Mod A +2 side step up towards HOB, assist for weakness and stability    Ambulation/Gait                  Stairs            Wheelchair Mobility    Modified Rankin (Stroke Patients Only)       Balance Overall balance assessment: History of Falls Sitting-balance support: Bilateral upper extremity supported;Feet supported Sitting balance-Leahy Scale: Poor Sitting balance - Comments: Bottom sore so he does not sit squared up and uses Bil UEs to help with decreasing WB'ing while seated EOB   Standing balance support: Bilateral upper extremity supported;Reliant on assistive device for balance Standing balance-Leahy Scale: Poor                               Pertinent Vitals/Pain Pain Assessment: Faces Faces Pain Scale: Hurts even more Pain Location: Bil feet with WB'ing, and bottom when he sits on EOB Pain Descriptors / Indicators: Aching;Sore;Dull;Guarding Pain Intervention(s): Repositioned;Monitored during session    Home Living Family/patient expects to be discharged to:: Private residence Living Arrangements: Other (Comment) (roommate) Available Help at Discharge:  (roommate but is in and out) Type of Home: House Home Access: Stairs to enter Entrance Stairs-Rails: None Entrance Stairs-Number of Steps:  4   Home Layout: Able to live on main level with bedroom/bathroom Home Equipment: Cane - single Librarian, academic (2 wheels)      Prior Function Prior Level of Function : Independent/Modified Independent             Mobility Comments: was using RW pta due to Bil foot pain        Hand Dominance   Dominant Hand: Right    Extremity/Trunk Assessment   Upper Extremity Assessment Upper Extremity Assessment: Overall WFL for tasks assessed    Lower Extremity Assessment Lower Extremity Assessment: Generalized weakness       Communication   Communication: No difficulties  Cognition Arousal/Alertness: Awake/alert Behavior During Therapy: WFL for tasks assessed/performed Overall Cognitive Status: Within Functional Limits for tasks assessed                                          General Comments      Exercises     Assessment/Plan    PT Assessment Patient needs continued PT services  PT Problem List Decreased strength;Decreased activity tolerance;Decreased balance;Decreased mobility;Decreased knowledge of use of DME;Decreased skin integrity       PT Treatment Interventions Gait training;DME instruction;Therapeutic exercise;Balance training;Functional mobility training;Therapeutic activities;Patient/family education    PT Goals (Current goals can be found in the Care Plan section)  Acute Rehab PT Goals PT Goal Formulation: With patient Time For Goal Achievement: 09/09/21 Potential to Achieve Goals: Good    Frequency Min 3X/week   Barriers to discharge        Co-evaluation PT/OT/SLP Co-Evaluation/Treatment: Yes Reason for Co-Treatment: For patient/therapist safety;To address functional/ADL transfers PT goals addressed during session: Mobility/safety with mobility OT goals addressed during session: ADL's and self-care       AM-PAC PT "6 Clicks" Mobility  Outcome Measure Help needed turning from your back to your side while in a flat bed without using bedrails?: A Little Help needed moving from lying on your back to sitting on the side of a flat bed without using bedrails?: A Little Help needed moving to and from a bed to a chair (including a wheelchair)?: A Lot Help needed standing up from a chair using your arms (e.g.,  wheelchair or bedside chair)?: A Lot Help needed to walk in hospital room?: A Lot Help needed climbing 3-5 steps with a railing? : Total 6 Click Score: 13    End of Session   Activity Tolerance: Patient limited by fatigue;Patient limited by pain Patient left: in bed;with call bell/phone within reach Nurse Communication: Mobility status PT Visit Diagnosis: Difficulty in walking, not elsewhere classified (R26.2);Muscle weakness (generalized) (M62.81)    Time: 4401-0272 PT Time Calculation (min) (ACUTE ONLY): 14 min   Charges:   PT Evaluation $PT Eval Low Complexity: 1 Low        Kati PT, DPT Acute Rehabilitation Services Pager: [phone] Office: [phone]  Janan Halter [place] 08/26/2021, 1:13 PM

## 2021-08-26 NOTE — Progress Notes (Signed)
ABI has been completed.   Results can be found under chart review under CV PROC. 08/26/2021 4:16 PM Areana Kosanke RVT, RDMS

## 2021-08-27 DIAGNOSIS — L03119 Cellulitis of unspecified part of limb: Secondary | ICD-10-CM

## 2021-08-27 DIAGNOSIS — L089 Local infection of the skin and subcutaneous tissue, unspecified: Secondary | ICD-10-CM

## 2021-08-27 DIAGNOSIS — F1020 Alcohol dependence, uncomplicated: Secondary | ICD-10-CM

## 2021-08-27 DIAGNOSIS — E43 Unspecified severe protein-calorie malnutrition: Secondary | ICD-10-CM

## 2021-08-27 DIAGNOSIS — L97411 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 breakdown of skin: Secondary | ICD-10-CM

## 2021-08-27 LAB — BASIC METABOLIC PANEL
Anion gap: 7 (ref 5–15)
BUN: 8 mg/dL (ref 6–20)
CO2: 23 mmol/L (ref 22–32)
Calcium: 8.4 mg/dL — ABNORMAL LOW (ref 8.9–10.3)
Chloride: 101 mmol/L (ref 98–111)
Creatinine, Ser: 0.61 mg/dL (ref 0.61–1.24)
GFR, Estimated: >60 mL/min (ref >60)
Glucose, Bld: 104 mg/dL — ABNORMAL HIGH (ref 70–99)
Potassium: 3.7 mmol/L (ref 3.5–5.1)
Sodium: 131 mmol/L — ABNORMAL LOW (ref 135–145)

## 2021-08-27 LAB — CBC
HCT: 34.1 % — ABNORMAL LOW (ref 39.0–52.0)
Hemoglobin: 11.4 g/dL — ABNORMAL LOW (ref 13.0–17.0)
MCH: 32 pg (ref 26.0–34.0)
MCHC: 33.4 g/dL (ref 30.0–36.0)
MCV: 95.8 fL (ref 80.0–100.0)
Platelets: 202 10*3/uL (ref 150–400)
RBC: 3.56 MIL/uL — ABNORMAL LOW (ref 4.22–5.81)
RDW: 14.6 % (ref 11.5–15.5)
WBC: 5.9 10*3/uL (ref 4.0–10.5)
nRBC: 0 % (ref 0.0–0.2)

## 2021-08-27 LAB — CK: Total CK: 279 U/L (ref 49–397)

## 2021-08-27 MED ORDER — COLLAGENASE 250 UNIT/GM EX OINT
TOPICAL_OINTMENT | Freq: Every day | CUTANEOUS | Status: DC
  Filled 2021-08-27 (×2): qty 30

## 2021-08-27 NOTE — TOC Progression Note (Signed)
Transition of Care [facility]) - Progression Note    Patient Details  Name: Dylan Weaver MRN: [id number] Date of Birth: [birth year]  [facility] of [facility] CM/SW Contact  Abdirahim, [place], [place] Phone Number: 08/27/2021, 5:17 PM  Clinical Narrative:     Clincal information will have to be sent to [facility] on Monday for them to review and approved patient for SNF.  Once patient has been approved, then CSW will have to find a SNF that can accept [facility] patient.  TOC to follow up on Monday.   Expected Discharge Plan: Skilled Nursing Facility Barriers to Discharge: English as a second language teacher, Continued Medical Work up, SNF Pending bed offer  Expected Discharge Plan and Services Expected Discharge Plan: Skilled Nursing Facility     Post Acute Care Choice: Skilled Nursing Facility Living arrangements for the past 2 months: Single Family Home                                       Social Determinants of Health (SDOH) Interventions    Readmission Risk Interventions No flowsheet data found.

## 2021-08-27 NOTE — Progress Notes (Signed)
Orthopedic Tech Progress Note Patient Details:  Dylan Weaver [facility] [birth year] [id number]  Ortho Devices Type of Ortho Device: Prafo boot/shoe Ortho Device/Splint Location: bilateral Ortho Device/Splint Interventions: Application   Post Interventions Patient Tolerated: Well Instructions Provided: Care of device  Saul Fordyce 08/27/2021, 9:49 AM

## 2021-08-27 NOTE — Consult Note (Signed)
ORTHOPAEDIC CONSULTATION  REQUESTING PHYSICIAN: Calvert Cantor, MD  Chief Complaint: Difficulty walking with ulcers both feet.  HPI: Dylan Weaver is a 55 y.o. male who presents with large black decubitus right heel ulcer with multiple ulcers on both lower extremities.  Patient states he has difficulty ambulating and has no one to care for him at home.  Past Medical History:  Diagnosis Date   ETOH abuse    Past Surgical History:  Procedure Laterality Date   BIOPSY  06/08/2021   Procedure: BIOPSY;  Surgeon: Imogene Burn, MD;  Location: [facility] ENDOSCOPY;  Service: Endoscopy;;   BIOPSY  06/10/2021   Procedure: BIOPSY;  Surgeon: Lynann Bologna, MD;  Location: [facility] ENDOSCOPY;  Service: Gastroenterology;;   COLONOSCOPY WITH PROPOFOL N/A 06/10/2021   Procedure: COLONOSCOPY WITH PROPOFOL;  Surgeon: Lynann Bologna, MD;  Location: [facility] ENDOSCOPY;  Service: Gastroenterology;  Laterality: N/A;   ESOPHAGOGASTRODUODENOSCOPY (EGD) WITH PROPOFOL N/A 06/08/2021   Procedure: ESOPHAGOGASTRODUODENOSCOPY (EGD) WITH PROPOFOL;  Surgeon: Imogene Burn, MD;  Location[facility] ENDOSCOPY;  Service: Endoscopy;  Laterality: N/A;   NASAL SEPTUM SURGERY     WRIST SURGERY     Social History   Socioeconomic History   Marital status: Divorced    Spouse name: Not on file   Number of children: Not on file   Years of education: Not on file   Highest education level: Not on file  Occupational History   Not on file  Tobacco Use   Smoking status: Every Day    Packs/day: 0.50    Types: Cigarettes   Smokeless tobacco: Never  Vaping Use   Vaping Use: Never used  Substance and Sexual Activity   Alcohol use: Yes    Comment: 12 pack a day beer   Drug use: Never   Sexual activity: Not Currently  Other Topics Concern   Not on file  Social History Narrative   Not on file   Social Determinants of Health   Financial Resource Strain: Not on file  Food Insecurity: Not on file  Transportation Needs: Not on file   Physical Activity: Not on file  Stress: Not on file  Social Connections: Not on file   Family History  Problem Relation Age of Onset   Hypertension Other    - negative except otherwise stated in the family history section No Known Allergies Prior to Admission medications   Medication Sig Start Date End Date Taking? Authorizing Provider  folic acid (FOLVITE) 1 MG tablet Take 1 tablet (1 mg total) by mouth daily. 12/04/20  Yes Barnetta Chapel, MD  Multiple Vitamin (MULTIVITAMIN WITH MINERALS) TABS tablet Take 1 tablet by mouth daily before breakfast. 12/04/20  Yes Barnetta Chapel, MD  famotidine (PEPCID) 40 MG tablet Take 1 tablet (40 mg total) by mouth 2 (two) times daily. Patient not taking: Reported on 08/25/2021 06/14/21 09/12/21  Marinda Elk, MD  gabapentin (NEURONTIN) 100 MG capsule Take 1 capsule (100 mg total) by mouth 3 (three) times daily. Patient not taking: Reported on 08/25/2021 06/14/21   Marinda Elk, MD  magnesium oxide (MAG-OX) 400 (240 Mg) MG tablet Take 2 tablets (800 mg total) by mouth 2 (two) times daily. Patient not taking: Reported on 08/25/2021 07/05/21   Cipriano Bunker, MD  midodrine (PROAMATINE) 2.5 MG tablet Take 1 tablet (2.5 mg total) by mouth 3 (three) times daily with meals. Patient not taking: Reported on 08/25/2021 06/14/21   Marinda Elk, MD  Nystatin (GERHARDT'S BUTT CREAM) CREA  Apply 1 application topically 2 (two) times daily. Patient not taking: Reported on 02/07/2021 12/04/20   Berton Mount I, MD  vitamin B-12 1000 MCG tablet Take 1 tablet (1,000 mcg total) by mouth daily. Patient not taking: Reported on 08/25/2021 07/06/21   Cipriano Bunker, MD   DG Forearm Right  Result Date: 08/26/2021 CLINICAL DATA:  Possible retained glass EXAM: RIGHT FOREARM - 2 VIEW COMPARISON:  08/08/2021 FINDINGS: No fracture or malalignment. The previously noted linear radiopacity adjacent to shaft of ulna is not visualized. Punctate densities over  the skin at the level of the wound are nonspecific. IMPRESSION: Soft tissue wound at the distal forearm. The previously noted suspected foreign bodies are no longer visualized Electronically Signed   By: Jasmine Pang M.D.   On: 08/26/2021 19:55   DG Tibia/Fibula Left  Result Date: 08/25/2021 CLINICAL DATA:  Pain after questionable injury.  Swelling. EXAM: LEFT TIBIA AND FIBULA - 2 VIEW COMPARISON:  None. FINDINGS: There is no evidence of fracture or other focal bone lesions. Soft tissues are unremarkable. IMPRESSION: Negative. Electronically Signed   By: Gerome Sam III M.D.   On: 08/25/2021 19:34   DG Tibia/Fibula Right  Result Date: 08/25/2021 CLINICAL DATA:  Pain.  Questionable injury. EXAM: RIGHT TIBIA AND FIBULA - 2 VIEW COMPARISON:  None. FINDINGS: There is no evidence of fracture or other focal bone lesions. Soft tissues are unremarkable. IMPRESSION: Negative. Electronically Signed   By: Gerome Sam III M.D.   On: 08/25/2021 19:33   MR FOOT RIGHT W WO CONTRAST  Result Date: 08/26/2021 CLINICAL DATA:  Infection, foot wound EXAM: MRI OF THE RIGHT FOREFOOT WITHOUT AND WITH CONTRAST TECHNIQUE: Multiplanar, multisequence MR imaging of the right hindfoot was performed before and after the administration of intravenous contrast. CONTRAST:  7.32mL GADAVIST GADOBUTROL 1 MMOL/ML IV SOLN COMPARISON:  Right foot x-ray 08/25/2021 FINDINGS: Study is limited due to motion. Bones/Joint/Cartilage No acute fracture identified. Note is made of previous bone infarcts in the distal tibia measuring 2.5 x 2.6 x 8 cm and in the posterior calcaneus measuring 3 x 2 x 2.3 cm. No bone marrow edema identified to suggest acute osteomyelitis. Degenerative subchondral edema and small cysts at the mid talar dome. Foci of subchondral degenerative edema at the calcaneocuboid joint noted. Ligaments No acute ligamentous injury visualized. Muscles and Tendons Tendons of the ankle are intact with normal signal. Visualized  musculature is unremarkable. Soft tissues Soft tissue wound at the posterior aspect of the heel with mild underlying subcutaneous soft tissue edema and enhancement. No defined fluid collection/abscess identified. IMPRESSION: 1. No evidence of acute osteomyelitis visualized. 2. Soft tissue ulceration and likely mild cellulitis at the posterior aspect of the heel. 3. 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 4. Other degenerative changes as described. Electronically Signed   By: Jannifer Hick M.D.   On: 08/26/2021 15:43   DG Chest Port 1 View  Result Date: 08/25/2021 CLINICAL DATA:  Bilateral leg pain and swelling. EXAM: PORTABLE CHEST 1 VIEW COMPARISON:  06/04/2021 FINDINGS: Lungs are adequately inflated without consolidation or effusion. Cardiomediastinal silhouette and remainder of the chest is unchanged. IMPRESSION: No active disease. Electronically Signed   By: Elberta Fortis M.D.   On: 08/25/2021 18:58   DG Foot Complete Left  Result Date: 08/25/2021 CLINICAL DATA:  Pain and swell lung after questionable injury. EXAM: LEFT FOOT - COMPLETE 3+ VIEW COMPARISON:  None. FINDINGS: Mild irregularity of the base of the second proximal phalanx on the  oblique view. No other acute abnormalities. IMPRESSION: Mild irregularity at the base of the second proximal phalanx could represent a subtle fracture. Recommend clinical correlation for point tenderness in this region. No other abnormalities. Electronically Signed   By: Gerome Sam III M.D.   On: 08/25/2021 19:35   DG Foot Complete Right  Result Date: 08/25/2021 CLINICAL DATA:  Pain and swelling after possible injury. EXAM: RIGHT FOOT COMPLETE - 3+ VIEW COMPARISON:  None. FINDINGS: There is no evidence of fracture or dislocation. There is no evidence of arthropathy or other focal bone abnormality. Soft tissues are unremarkable. IMPRESSION: Negative. Electronically Signed   By: Gerome Sam III M.D.   On: 08/25/2021 19:37   VAS [place] ABI  WITH/WO TBI  Result Date: 08/26/2021  LOWER EXTREMITY DOPPLER STUDY Patient Name:  Dylan Weaver  Date of Exam:   08/26/2021 Medical Rec #: [id number]                Accession #:    [id number] Date of Birth: May 29, 1966                Patient Gender: M Patient Age:   33 years Exam Location:  [facility] Procedure:      VAS [place] ABI WITH/WO TBI Referring Phys: Calvert Cantor --------------------------------------------------------------------------------  Indications: RT foot infection & BLE heel wounds. High Risk Factors: Current smoker.  Comparison Study: No previous exams Performing Technologist: Hill, Jody RVT, RDMS  Examination Guidelines: A complete evaluation includes at minimum, Doppler waveform signals and systolic blood pressure reading at the level of bilateral brachial, anterior tibial, and posterior tibial arteries, when vessel segments are accessible. Bilateral testing is considered an integral part of a complete examination. Photoelectric Plethysmograph (PPG) waveforms and toe systolic pressure readings are included as required and additional duplex testing as needed. Limited examinations for reoccurring indications may be performed as noted.  ABI Findings: +--------+------------------+-----+---------+--------+ Right   Rt Pressure (mmHg)IndexWaveform Comment  +--------+------------------+-----+---------+--------+ [id number]                    triphasic         +--------+------------------+-----+---------+--------+ PTA     142               1.10 biphasic          +--------+------------------+-----+---------+--------+ DP      144               1.12 biphasic          +--------+------------------+-----+---------+--------+ +--------+------------------+-----+---------+---------+ Left    Lt Pressure (mmHg)IndexWaveform Comment   +--------+------------------+-----+---------+---------+ Brachial                       triphasicIV to LUE  +--------+------------------+-----+---------+---------+ PTA     149               1.16 biphasic           +--------+------------------+-----+---------+---------+ DP      130               1.01 biphasic           +--------+------------------+-----+---------+---------+  Summary: Right: Resting right ankle-brachial index is within normal range. No evidence of significant right lower extremity arterial disease. Left: Resting left ankle-brachial index is within normal range. No evidence of significant left lower extremity arterial disease.  *See table(s) above for measurements and observations.     Preliminary    - pertinent xrays, CT, MRI studies were  reviewed and independently interpreted  Positive ROS: All other systems have been reviewed and were otherwise negative with the exception of those mentioned in the HPI and as above.  Physical Exam: General: Alert, no acute distress Psychiatric: Patient is competent for consent with normal mood and affect Lymphatic: No axillary or cervical lymphadenopathy Cardiovascular: No pedal edema Respiratory: No cyanosis, no use of accessory musculature GI: No organomegaly, abdomen is soft and non-tender    Images:  @ENCIMAGES @  Labs:  Lab Results  Component Value Date   REPTSTATUS PENDING 08/25/2021   CULT  08/25/2021    NO GROWTH 2 DAYS Performed at [facility], [address], [place], [place]    LABORGA KLEBSIELLA PNEUMONIAE (A) 09/14/2020    Lab Results  Component Value Date   ALBUMIN 2.6 (L) 08/26/2021   ALBUMIN 2.6 (L) 08/25/2021   ALBUMIN 2.4 (L) 06/30/2021   PREALBUMIN 8.8 (L) 08/26/2021     CBC EXTENDED Latest Ref Rng & Units 08/27/2021 08/26/2021 08/25/2021  WBC 4.0 - 10.5 K/uL 5.9 7.9 8.6  RBC 4.22 - 5.81 MIL/uL 3.56(L) 4.31 4.16(L)  HGB 13.0 - 17.0 g/dL 11.4(L) 13.5 13.3  HCT 39.0 - 52.0 % 34.1(L) 40.0 38.2(L)  PLT 150 - 400 K/uL 202 232 259  NEUTROABS 1.7 - 7.7 K/uL - 5.5 5.9  LYMPHSABS 0.7 - 4.0  K/uL - 1.5 1.8    Neurologic: Patient does not have protective sensation bilateral lower extremities.   MUSCULOSKELETAL:   Skin: Examination patient has multiple ulcers on both lower extremities.  There is a large black decubitus right heel ulcer there was good granulation tissue around the ulcer margins.  There is also a small decubitus ulcer on the left.  The left lower extremity ulcer has healthy granulation tissue.  Patient has a palpable anterior tibial pulse bilaterally.  Ankle-brachial indices show biphasic flow with elevated pressures consistent with peripheral vascular disease.  Review of the MRI scan does not show any osteomyelitis of the right calcaneus.  MRI scan does show bony infarcts possible secondary to alcohol use.  White blood cell count 5.9 hemoglobin 11.4.  Albumin 2.6 with a prealbumin of 8.8.  Assessment: Assessment: Protein caloric malnutrition with peripheral vascular disease with a decubitus ulcer on the right heel and multiple ulcers on both lower extremities.  Plan: I agree with continuing IV antibiotics.  I will write orders for a PRAFO to be worn 24 hours a day both lower extremities.  Patient will start with Santyl dressing changes on the right, and after discharge I will follow-up in the office and evaluate for further wound care.  Patient most likely will need discharge to skilled nursing.  Thank you for the consult and the opportunity to see Mr. Dylan Meisinger, MD [facility] [phone] 9:10 AM
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
## 2021-08-27 NOTE — NC FL2 (Signed)
[place] MEDICAID FL2 LEVEL OF CARE SCREENING TOOL     IDENTIFICATION  Patient Name: Dylan Weaver Birthdate: 04/15/1966 Sex: male Admission Date (Current Location): 08/25/2021  [facility] and [place] Number:  Producer, television/film/video and Address:  [facility],  [address] 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)    Cellulitis 08/26/2021   Tobacco abuse 08/26/2021   Infection of right foot with necrosis 08/26/2021   Foot infection 08/26/2021   Hyponatremia    Infected superficial injury of right upper arm    Decompensated hepatic cirrhosis (HCC) 08/25/2021   Protein-calorie malnutrition, severe 06/14/2021   Acute liver failure with hepatic coma (HCC) 06/04/2021   Acute renal failure (ARF) (HCC) 06/04/2021   SIRS (systemic inflammatory response syndrome) (HCC) 11/29/2020   Elevated LFTs 11/29/2020   Intertrigo 11/29/2020   Prolonged QT interval 11/29/2020   Malnutrition of moderate degree 09/16/2020   Alcohol withdrawal (HCC) 09/14/2020   Alcoholic hepatitis 09/14/2020   Thrombocytopenia (HCC) 09/14/2020   Normochromic normocytic anemia 09/14/2020   Alcohol use disorder, severe, dependence (HCC) 06/19/2020   Alcohol abuse with intoxication (HCC) 06/19/2020    Orientation RESPIRATION BLADDER Height & Weight     Self, Situation, Time, Place  Normal Incontinent Weight: 161 lb 13.1 oz (73.4 kg) Height:  6' (182.9 cm)   BEHAVIORAL SYMPTOMS/MOOD NEUROLOGICAL BOWEL NUTRITION STATUS      Continent Diet  AMBULATORY STATUS COMMUNICATION OF NEEDS Skin   Limited Assist Verbally PU Stage and Appropriate Care                       Personal Care Assistance Level of Assistance  Bathing, Feeding, Dressing Bathing Assistance: Limited assistance Feeding assistance: Independent Dressing Assistance: Limited assistance     Functional Limitations Info  Sight, Hearing, Speech Sight Info: Adequate Hearing Info: Adequate Speech Info: Adequate    SPECIAL CARE FACTORS FREQUENCY  PT (By licensed PT), OT (By licensed OT)     PT Frequency: Minimum 5x a week OT Frequency: Minimum 5 a week            Contractures Contractures Info: Not present    Additional Factors Info  Code Status, Psychotropic, Allergies Code Status Info: Full Code Allergies Info: NKA           Current Medications (08/27/2021):  This is the current hospital active medication list Current Facility-Administered Medications  Medication Dose Route Frequency Provider Last Rate Last Admin   0.9 %  sodium chloride infusion   Intravenous PRN Therisa Doyne, MD 10 mL/hr at 08/26/21 0248 New Bag at 08/26/21 0248   acetaminophen (TYLENOL) tablet 325 mg  325 mg Oral Q6H PRN Luiz Iron, NP   325 mg at 08/26/21 0601   ascorbic acid (VITAMIN C) tablet 250 mg  250 mg Oral BID Calvert Cantor, MD   250 mg at 08/27/21 1607   ceFAZolin (ANCEF) IVPB 1 g/50 mL premix  1 g Intravenous Q8H Doutova, Anastassia, MD 100 mL/hr at 08/27/21 1345 1 g at 08/27/21 1345   collagenase (SANTYL) ointment   Topical Daily Newt Minion, MD       feeding supplement (ENSURE ENLIVE / ENSURE PLUS) liquid 237 mL  237 mL Oral BID BM Rizwan, Saima, MD   237 mL at [id number]   folic acid (FOLVITE) tablet 1 mg  1 mg Oral Daily Doutova, Anastassia, MD   1 mg at 08/27/21 [id number]   LORazepam (ATIVAN) tablet 1-4 mg  1-4 mg Oral Q1H PRN Toy Baker, MD       Or    LORazepam (ATIVAN) injection 1-4 mg  1-4 mg Intravenous Q1H PRN Doutova, Anastassia, MD       magnesium oxide (MAG-OX) tablet 800 mg  800 mg Oral BID Debbe Odea, MD   800 mg at 08/27/21 0833   midodrine (PROAMATINE) tablet 2.5 mg  2.5 mg Oral TID WC Rizwan, Eunice Blase, MD   2.5 mg at 08/27/21 1321   multivitamin with minerals tablet 1 tablet  1 tablet Oral Daily Toy Baker, MD   1 tablet at 08/27/21 [id number]   nutrition supplement (JUVEN) (JUVEN) powder packet 1 packet  1 packet Oral BID BM Debbe Odea, MD   1 packet at 08/27/21 1321   thiamine tablet 100 mg  100 mg Oral Daily Doutova, Anastassia, MD   100 mg at 08/27/21 [id number]   Or   thiamine (B-1) injection 100 mg  100 mg Intravenous Daily Doutova, Anastassia, MD       Zinc Oxide (TRIPLE PASTE) 12.8 % ointment   Topical QID Debbe Odea, MD   Given at 08/27/21 1030     Discharge Medications: Please see discharge summary for a list of discharge medications.  Relevant Imaging Results:  Relevant Lab Results:   Additional Information SSN [id number]  Justinian, Roberg, LCSW

## 2021-08-28 LAB — GLUCOSE, CAPILLARY: Glucose-Capillary: 105 mg/dL — ABNORMAL HIGH (ref 70–99)
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
## 2021-08-29 LAB — CBC
HCT: 37.7 % — ABNORMAL LOW (ref 39.0–52.0)
Hemoglobin: 12.7 g/dL — ABNORMAL LOW (ref 13.0–17.0)
MCH: 31.8 pg (ref 26.0–34.0)
MCHC: 33.7 g/dL (ref 30.0–36.0)
MCV: 94.3 fL (ref 80.0–100.0)
Platelets: 231 10*3/uL (ref 150–400)
RBC: 4 MIL/uL — ABNORMAL LOW (ref 4.22–5.81)
RDW: 14.2 % (ref 11.5–15.5)
WBC: 6.9 10*3/uL (ref 4.0–10.5)
nRBC: 0 % (ref 0.0–0.2)

## 2021-08-29 LAB — BASIC METABOLIC PANEL
Anion gap: 8 (ref 5–15)
BUN: 15 mg/dL (ref 6–20)
CO2: 22 mmol/L (ref 22–32)
Calcium: 8.7 mg/dL — ABNORMAL LOW (ref 8.9–10.3)
Chloride: 102 mmol/L (ref 98–111)
Creatinine, Ser: 0.47 mg/dL — ABNORMAL LOW (ref 0.61–1.24)
GFR, Estimated: >60 mL/min (ref >60)
Glucose, Bld: 105 mg/dL — ABNORMAL HIGH (ref 70–99)
Potassium: 3.8 mmol/L (ref 3.5–5.1)
Sodium: 132 mmol/L — ABNORMAL LOW (ref 135–145)

## 2021-08-29 LAB — MAGNESIUM: Magnesium: 1.8 mg/dL (ref 1.7–2.4)

## 2021-08-29 MED ORDER — OXYCODONE HCL 5 MG PO TABS
5.0000 mg | ORAL_TABLET | ORAL | Status: DC | PRN
  Administered 2021-08-29 – 2021-09-01 (×6): 10 mg via ORAL
  Filled 2021-08-29 (×3): qty 2
  Filled 2021-08-29 (×2): qty 1
  Filled 2021-08-29 (×3): qty 2

## 2021-08-29 NOTE — TOC Progression Note (Addendum)
Transition of Care [facility]) - Progression Note    Patient Details  Name: Dylan Weaver MRN: [id number] Date of Birth: 08/05/1966  [facility] CM/SW Contact  Jvon, [place], [place] Phone Number: 08/29/2021, 10:57 AM  Clinical Narrative:     CSW faxed clinical information and [facility] checklist to [facility] Team at [phone] to screen for SNF placement.  TOC case manager to follow up with [facility] on decision for SNF placement.   Expected Discharge Plan: Skilled Nursing Facility Barriers to Discharge: English as a second language teacher, Continued Medical Work up, SNF Pending bed offer  Expected Discharge Plan and Services Expected Discharge Plan: Skilled Nursing Facility     Post Acute Care Choice: Skilled Nursing Facility Living arrangements for the past 2 months: Single Family Home                                       Social Determinants of Health (SDOH) Interventions    Readmission Risk Interventions No flowsheet data found.

## 2021-08-29 NOTE — Progress Notes (Signed)
Physical Therapy Treatment Patient Details Name: Dylan Weaver MRN: [id number] DOB: 1966/06/09 Today's Date: 08/29/2021   History of Present Illness This is a 55 year old male who has a history of alcohol abuse, severe malnutrition, chronic thrombocytopenia and cirrhosis and presents to the hospital for bilateral feet leg pain along with redness and swelling.  In the ED he was noted to have edema from his knee down with pitting.  There are also wounds on his feet and it was felt that he had an infection.  Alcohol level was 280, sodium 122, CO2 18, total bilirubin 2.1.  He was given Ancef and referred for admission.    PT Comments    General Comments: AxO x 3 very pleasant and familiar from last admission 6 months ago. His physical appearance is worse.  Assisted OOB to attempt gait.  General bed mobility comments: required increased time and caution to lines. General transfer comment: Mod A +2 side step up towards HOB, assist for weakness and stability.  General Gait Details: pt only agreed to amb around room requiring Mod Assist due to gait instability.  Pt also wearing B PRAFO's which can be difficult to walk in.  Pt c/o increased pain B feet with activity.   Pt also c/o 3/4 dyspnea.  Pt could not believe how that lillte bit of activity exerted him. Pt lives in his mother's  house with a roommate but will need to D/C to SNF.   Recommendations for follow up therapy are one component of a multi-disciplinary discharge planning process, led by the attending physician.  Recommendations may be updated based on patient status, additional functional criteria and insurance authorization.  Follow Up Recommendations  Skilled nursing-short term rehab (<3 hours/day)     Assistance Recommended at Discharge Frequent or constant Supervision/Assistance  Equipment Recommendations  None recommended by PT    Recommendations for Other Services       Precautions / Restrictions  Precautions Precautions: Fall Precaution Comments: Bil Heel wounds, MASD periarea     Mobility  Bed Mobility Overal bed mobility: Needs Assistance Bed Mobility: Supine to Sit     Supine to sit: Min guard     General bed mobility comments: required increased time and caution to lines.    Transfers Overall transfer level: Needs assistance Equipment used: Rolling walker (2 wheels) Transfers: Sit to/from Stand Sit to Stand: Mod assist;+2 physical assistance           General transfer comment: Mod A +2 side step up towards [facility], assist for weakness and stability    Ambulation/Gait Ambulation/Gait assistance: Mod assist Gait Distance (Feet): 7 Feet Assistive device: Rolling walker (2 wheels) Gait Pattern/deviations: Step-to pattern;Narrow base of support;Trunk flexed Gait velocity: decreased     General Gait Details: pt only agreed to amb around room requiring Mod Assist due to gait instability.  Pt also wearing B PRAFO's which can be difficult to walk in.  Pt c/o increased pain B feet with activity.   Pt also c/o 3/4 dyspnea.  Pt could not believe how that lillt bit of activity exerted him.   Stairs             Wheelchair Mobility    Modified Rankin (Stroke Patients Only)       Balance  Cognition Arousal/Alertness: Awake/alert Behavior During Therapy: WFL for tasks assessed/performed Overall Cognitive Status: Within Functional Limits for tasks assessed                                 General Comments: AxO x 3 very pleasant and familiar from last admission 6 months ago.        Exercises      General Comments        Pertinent Vitals/Pain Pain Assessment: Faces Faces Pain Scale: Hurts even more Pain Location: Bil feet with WB'ing, and bottom when he sits on EOB Pain Descriptors / Indicators: Aching;Sore;Dull;Guarding Pain Intervention(s): Monitored during  session;Premedicated before session;Repositioned    Home Living                          Prior Function            PT Goals (current goals can now be found in the care plan section) Progress towards PT goals: Progressing toward goals    Frequency    Min 3X/week      PT Plan Current plan remains appropriate    Co-evaluation              AM-PAC PT "6 Clicks" Mobility   Outcome Measure  Help needed turning from your back to your side while in a flat bed without using bedrails?: A Little Help needed moving from lying on your back to sitting on the side of a flat bed without using bedrails?: A Little Help needed moving to and from a bed to a chair (including a wheelchair)?: A Lot Help needed standing up from a chair using your arms (e.g., wheelchair or bedside chair)?: A Lot Help needed to walk in hospital room?: A Lot Help needed climbing 3-5 steps with a railing? : Total 6 Click Score: 13    End of Session Equipment Utilized During Treatment: Gait belt Activity Tolerance: Patient limited by fatigue;Patient limited by pain Patient left: in chair;with call bell/phone within reach;with chair alarm set Nurse Communication: Mobility status PT Visit Diagnosis: Difficulty in walking, not elsewhere classified (R26.2);Muscle weakness (generalized) (M62.81)     Time: 8502-7741 PT Time Calculation (min) (ACUTE ONLY): 28 min  Charges:  $Gait Training: 8-22 mins $Therapeutic Activity: 8-22 mins                     {Dylan Weaver  PTA Acute  Rehabilitation Services Pager      [phone] Office      [phone]
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
## 2021-08-30 LAB — CULTURE, BLOOD (ROUTINE X 2)
Culture: NO GROWTH
Culture: NO GROWTH
Special Requests: ADEQUATE
Special Requests: ADEQUATE

## 2021-08-30 MED ORDER — MIDODRINE HCL 5 MG PO TABS
ORAL_TABLET | ORAL | Status: AC
  Filled 2021-08-30: qty 1

## 2021-08-30 NOTE — Progress Notes (Signed)
Patient ambulated approximately 50 feet in hallway with RN; tolerated well.
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
## 2021-08-30 NOTE — TOC Progression Note (Addendum)
Transition of Care [facility]) - Progression Note    Patient Details  Name: Rami Budhu MRN: [id number] Date of Birth: [birth year]  Transition of Care [facility]) CM/SW Contact  Lerry Cordrey, Olegario Messier, RN Phone Number: 08/30/2021, 2:35 PM  Clinical Narrative:Patient declines SNF-[place] [facility]-rep Roberta called;patient wants Home w/HHC-recc HHRN/PT,etoh resources,w/c transport.His friend Misty Stanley will asst @ home-arrange times for teaching her wound care in hospital;[facility] resources via [facility],w/c transport-await call back from [facility] rep Briana Wiley-email sent/pager sent;left vm-await response-tel#[phone];pager#[phone];SA referral Garner Nash [phone]-patient must be independent for walk in etoh resources-will put referral in d/c instructions.  3:39p-[facility] rep Pearson Grippe unable to accept back for HHC-etoh,& didn't answer phone for visits.No other HHC will accept-patient informed & still wants home-he is aware of declining SNF,  no HHC services.Safe ride w/c transport @ d/c.MD notified. 3:49p-checking w/[facility] rep Marylene Land if able to accept-await response.    Expected Discharge Plan: Home w Home Health Services Barriers to Discharge: Continued Medical Work up  Expected Discharge Plan and Services Expected Discharge Plan: Home w Home Health Services     Post Acute Care Choice: Skilled Nursing Facility Living arrangements for the past 2 months: Single Family Home                                       Social Determinants of Health (SDOH) Interventions    Readmission Risk Interventions No flowsheet data found.

## 2021-08-31 DIAGNOSIS — I959 Hypotension, unspecified: Secondary | ICD-10-CM

## 2021-08-31 DIAGNOSIS — E875 Hyperkalemia: Secondary | ICD-10-CM

## 2021-08-31 DIAGNOSIS — K760 Fatty (change of) liver, not elsewhere classified: Secondary | ICD-10-CM

## 2021-08-31 DIAGNOSIS — R748 Abnormal levels of other serum enzymes: Secondary | ICD-10-CM

## 2021-08-31 LAB — COMPREHENSIVE METABOLIC PANEL
ALT: 28 U/L (ref 0–44)
AST: 52 U/L — ABNORMAL HIGH (ref 15–41)
Albumin: 2.8 g/dL — ABNORMAL LOW (ref 3.5–5.0)
Alkaline Phosphatase: 147 U/L — ABNORMAL HIGH (ref 38–126)
Anion gap: 8 (ref 5–15)
BUN: 16 mg/dL (ref 6–20)
CO2: 21 mmol/L — ABNORMAL LOW (ref 22–32)
Calcium: 8.6 mg/dL — ABNORMAL LOW (ref 8.9–10.3)
Chloride: 102 mmol/L (ref 98–111)
Creatinine, Ser: 0.58 mg/dL — ABNORMAL LOW (ref 0.61–1.24)
GFR, Estimated: >60 mL/min (ref >60)
Glucose, Bld: 107 mg/dL — ABNORMAL HIGH (ref 70–99)
Potassium: 4.2 mmol/L (ref 3.5–5.1)
Sodium: 131 mmol/L — ABNORMAL LOW (ref 135–145)
Total Bilirubin: 0.7 mg/dL (ref 0.3–1.2)
Total Protein: 7.6 g/dL (ref 6.5–8.1)

## 2021-08-31 LAB — CBC WITH DIFFERENTIAL/PLATELET
Abs Immature Granulocytes: 0.03 10*3/uL (ref 0.00–0.07)
Basophils Absolute: 0.1 10*3/uL (ref 0.0–0.1)
Basophils Relative: 1 %
Eosinophils Absolute: 0.5 10*3/uL (ref 0.0–0.5)
Eosinophils Relative: 6 %
HCT: 36.5 % — ABNORMAL LOW (ref 39.0–52.0)
Hemoglobin: 12.2 g/dL — ABNORMAL LOW (ref 13.0–17.0)
Immature Granulocytes: 0 %
Lymphocytes Relative: 21 %
Lymphs Abs: 1.7 10*3/uL (ref 0.7–4.0)
MCH: 31.9 pg (ref 26.0–34.0)
MCHC: 33.4 g/dL (ref 30.0–36.0)
MCV: 95.3 fL (ref 80.0–100.0)
Monocytes Absolute: 0.9 10*3/uL (ref 0.1–1.0)
Monocytes Relative: 11 %
Neutro Abs: 4.9 10*3/uL (ref 1.7–7.7)
Neutrophils Relative %: 61 %
Platelets: 272 10*3/uL (ref 150–400)
RBC: 3.83 MIL/uL — ABNORMAL LOW (ref 4.22–5.81)
RDW: 14.1 % (ref 11.5–15.5)
WBC: 8 10*3/uL (ref 4.0–10.5)
nRBC: 0 % (ref 0.0–0.2)

## 2021-08-31 MED ORDER — MIDODRINE HCL 5 MG PO TABS
2.5000 mg | ORAL_TABLET | Freq: Once | ORAL | Status: AC
  Administered 2021-08-31: 2.5 mg via ORAL
  Filled 2021-08-31: qty 1

## 2021-08-31 MED ORDER — ALBUMIN HUMAN 25 % IV SOLN
25.0000 g | Freq: Once | INTRAVENOUS | Status: AC
  Administered 2021-08-31: 25 g via INTRAVENOUS
  Filled 2021-08-31: qty 100

## 2021-08-31 MED ORDER — MAGNESIUM OXIDE -MG SUPPLEMENT 400 (240 MG) MG PO TABS
ORAL_TABLET | ORAL | Status: AC
  Filled 2021-08-31: qty 1

## 2021-08-31 MED ORDER — ASCORBIC ACID 500 MG PO TABS
500.0000 mg | ORAL_TABLET | Freq: Two times a day (BID) | ORAL | Status: DC
  Administered 2021-08-31 – 2021-09-02 (×4): 500 mg via ORAL
  Filled 2021-08-31 (×4): qty 1

## 2021-08-31 MED ORDER — ZINC SULFATE 220 (50 ZN) MG PO CAPS
220.0000 mg | ORAL_CAPSULE | Freq: Every day | ORAL | Status: DC
  Administered 2021-08-31 – 2021-09-02 (×3): 220 mg via ORAL
  Filled 2021-08-31 (×3): qty 1

## 2021-08-31 MED ORDER — MIDODRINE HCL 5 MG PO TABS
5.0000 mg | ORAL_TABLET | Freq: Three times a day (TID) | ORAL | Status: DC
  Administered 2021-08-31 – 2021-09-01 (×3): 5 mg via ORAL
  Filled 2021-08-31 (×3): qty 1

## 2021-08-31 NOTE — Assessment & Plan Note (Addendum)
Drinks 3-12 beers most days No sx of withdrawal Long discussion on dangers today, encourage cessation

## 2021-08-31 NOTE — Assessment & Plan Note (Signed)
follow

## 2021-08-31 NOTE — Assessment & Plan Note (Signed)
supplements

## 2021-08-31 NOTE — Progress Notes (Signed)
Nutrition Follow-up  DOCUMENTATION CODES:   Severe malnutrition in context of chronic illness  INTERVENTION:  - continue Ensure Enlive BID and Juven BID. - continue to allow double protein portions at all meals.  - will increase ascorbic acid from 250 mg BID to 500 mg BID. - will order 220 mg zinc sulfate/day to aid in wound healing. - recommend diet liberalization from Heart Healthy to Regular to allow for more protein options at breakfast to promote wound healing.   NUTRITION DIAGNOSIS:   Severe Malnutrition related to chronic illness (hepatic cirrhosis) as evidenced by percent weight loss, mild fat depletion, severe muscle depletion. -ongoing  GOAL:   Patient will meet greater than or equal to 90% of their needs -minimally met with intakes of meals and supplements.   MONITOR:   PO intake, Supplement acceptance, Labs, Weight trends, I & O's, Skin   ASSESSMENT:   55 y.o. male with medical history significant of alcohol abuse ongoing and cirrhosis thrombocytopenia elevated LFTs protein calorie malnutrition        Presented with   bilateral leg swelling redness, and ongoing ETOH abuse  He ate 100% of meals on 11/25-11/27; no documentation in the flow sheet after that time.   He has been accepting Ensure 50% of the time offered and Juven 100% of the time offered.  He has not been weighed since admission on 11/25. No information documented in the edema section of flow sheet this hospitalization.    Labs reviewed; Na: 131 mmol/l, creatinine: 0.58 mg/dl, Ca: 8.6 mg/dl, Alk Phos elevated.  Medications reviewed; 250 mg ascorbic acid BID, 1 mg folvite/day, 800 mg mag-ox BID, 1 tablet multivitamin with minerals/day, 100 mg oral thiamine/day.   Diet Order:   Diet Order             Diet Heart Room service appropriate? 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

## 2021-08-31 NOTE — Assessment & Plan Note (Signed)
improved

## 2021-08-31 NOTE — Progress Notes (Signed)
Occupational Therapy Treatment Patient Details Name: Dylan Weaver MRN: [id number] DOB: 1966/07/23 Today's Date: 08/31/2021   History of present illness This is a 55 year old male who has a history of alcohol abuse, severe malnutrition, chronic thrombocytopenia and cirrhosis and presents to the hospital for bilateral feet leg pain along with redness and swelling.  In the ED he was noted to have edema from his knee down with pitting.  There are also wounds on his feet and it was felt that he had an infection.  Alcohol level was 280, sodium 122, CO2 18, total bilirubin 2.1.  He was given Ancef and referred for admission.   OT comments  Patient with fair progress toward patient focused goals.  Deficits impacting functional independence continue and are listed below.  Patient needing up to supervision for basic bed mobility, Min A to stand and walk to the sink.  Min A for balance to stand sink side for grooming.  Patient will continue to be follow in the acute setting, but SNF is recommended post acute to maximize functional status prior to returning home.     Recommendations for follow up therapy are one component of a multi-disciplinary discharge planning process, led by the attending physician.  Recommendations may be updated based on patient status, additional functional criteria and insurance authorization.    Follow Up Recommendations  Skilled nursing-short term rehab (<3 hours/day)    Assistance Recommended at Discharge    Equipment Recommendations  None recommended by OT    Recommendations for Other Services      Precautions / Restrictions Precautions Precautions: Fall Precaution Comments: Bil Heel wounds, MASD periarea Restrictions Weight Bearing Restrictions: No       Mobility Bed Mobility Overal bed mobility: Needs Assistance Bed Mobility: Supine to Sit;Sit to Supine     Supine to sit: Supervision Sit to supine: Supervision     Patient Response:  Impulsive  Transfers Overall transfer level: Needs assistance Equipment used: Rolling walker (2 wheels) Transfers: Sit to/from Stand Sit to Stand: Min assist           General transfer comment: cues to scoot to EOB, get feet underneath and lean forward.  Patient fell back onto the bed times two, getting too much weight on his heels.     Balance Overall balance assessment: History of Falls Sitting-balance support: Bilateral upper extremity supported;Feet supported Sitting balance-Leahy Scale: Fair     Standing balance support: Bilateral upper extremity supported;Reliant on assistive device for balance Standing balance-Leahy Scale: Poor                             ADL either performed or assessed with clinical judgement   ADL       Grooming: Bed level;Min guard;Standing Grooming Details (indicate cue type and reason): LOB backwards times two Upper Body Bathing: Set up;Bed level   Lower Body Bathing: Minimal assistance;Sit to/from stand Lower Body Bathing Details (indicate cue type and reason): Min A for balance, stand peri care.     Lower Body Dressing: Sitting/lateral leans;Moderate assistance Lower Body Dressing Details (indicate cue type and reason): Assist to donn socks and shoes for stand grooming             Functional mobility during ADLs: Minimal assistance;Rolling walker (2 wheels)      Extremity/Trunk Assessment Upper Extremity Assessment Upper Extremity Assessment: Overall WFL for tasks assessed (Weak for his age.)   Lower Extremity Assessment Lower Extremity Assessment:  Defer to PT evaluation   Cervical / Trunk Assessment Cervical / Trunk Assessment: Kyphotic    Vision Patient Visual Report: No change from baseline     Perception Perception Perception: Not tested   Praxis Praxis Praxis: Not tested    Cognition Arousal/Alertness: Awake/alert Behavior During Therapy: Impulsive Overall Cognitive Status: No family/caregiver  present to determine baseline cognitive functioning                                 General Comments: Decreased safety noted with in room mobility.          Exercises     Shoulder Instructions       General Comments      Pertinent Vitals/ Pain       Pain Assessment: Faces Faces Pain Scale: Hurts little more Pain Location: Bil feet s/p dressing change Pain Descriptors / Indicators: Aching;Sore Pain Intervention(s): Monitored during session                                                          Frequency  Min 2X/week        Progress Toward Goals  OT Goals(current goals can now be found in the care plan section)  Progress towards OT goals: Progressing toward goals  Acute Rehab OT Goals Patient Stated Goal: Get stronger and walk better OT Goal Formulation: With patient Time For Goal Achievement: 09/09/21 Potential to Achieve Goals: Good  Plan Discharge plan remains appropriate    Co-evaluation                 AM-PAC OT "6 Clicks" Daily Activity     Outcome Measure   Help from another person eating meals?: None Help from another person taking care of personal grooming?: A Little Help from another person toileting, which includes using toliet, bedpan, or urinal?: A Lot Help from another person bathing (including washing, rinsing, drying)?: A Lot Help from another person to put on and taking off regular upper body clothing?: A Little Help from another person to put on and taking off regular lower body clothing?: A Lot 6 Click Score: 16    End of Session Equipment Utilized During Treatment: Rolling walker (2 wheels)  OT Visit Diagnosis: Unsteadiness on feet (R26.81);Other abnormalities of gait and mobility (R26.89);Pain   Activity Tolerance Patient tolerated treatment well   Patient Left in bed;with call bell/phone within reach;with bed alarm set   Nurse Communication          Time: 0932-3557 OT Time  Calculation (min): 19 min  Charges: OT General Charges $OT Visit: 1 Visit OT Treatments $Self Care/Home Management : 8-22 mins  08/31/2021  RP, OTR/L  Acute Rehabilitation Services  Office:  [phone]   Suzanna Obey 08/31/2021, 3:45 PM
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
## 2021-08-31 NOTE — Assessment & Plan Note (Addendum)
Mild, follow °

## 2021-08-31 NOTE — Assessment & Plan Note (Addendum)
Midodrine, 10 mg TID  S/p albumin He's asymptomatic - bp's continue in the 90's , occasionally 100's Related to ?cirrhosis Echo without concerning findings

## 2021-08-31 NOTE — Progress Notes (Addendum)
Physical Therapy Treatment Patient Details Name: Dylan Weaver MRN: [id number] DOB: 01-01-1966 Today's Date: 08/31/2021   History of Present Illness This is a 55 year old male who has a history of alcohol abuse, severe malnutrition, chronic thrombocytopenia and cirrhosis and presents to the hospital for bilateral feet leg pain along with redness and swelling.  In the ED he was noted to have edema from his knee down with pitting.  There are also wounds on his feet and it was felt that he had an infection.  Alcohol level was 280, sodium 122, CO2 18, total bilirubin 2.1.  He was given Ancef and referred for admission.    PT Comments    Pt tolerated increased ambulation distance of 140' with RW, no loss of balance, but with some impulsive movement requiring VCs to slow down for safety. R heel dressing saturated in blood at end of session, RN notified. Encouragement required for pt participation. Pt is now mobilizing well enough to DC home with supervision for mobility. Per PT, evaluation, he has a RW at home.   Recommendations for follow up therapy are one component of a multi-disciplinary discharge planning process, led by the attending physician.  Recommendations may be updated based on patient status, additional functional criteria and insurance authorization.  Follow Up Recommendations  HHPT   Assistance Recommended at Discharge Frequent or constant Supervision/Assistance  Equipment Recommendations  None recommended by PT    Recommendations for Other Services       Precautions / Restrictions Precautions Precautions: Fall Precaution Comments: Bil Heel wounds, MASD periarea Restrictions Weight Bearing Restrictions: No     Mobility  Bed Mobility Overal bed mobility: Needs Assistance Bed Mobility: Supine to Sit;Sit to Supine     Supine to sit: Supervision;HOB elevated;Modified independent (Device/Increase time) Sit to supine: HOB elevated;Modified independent  (Device/Increase time)        Transfers Overall transfer level: Needs assistance Equipment used: Rolling walker (2 wheels) Transfers: Sit to/from Stand Sit to Stand: Min guard;From elevated surface           General transfer comment: VCs for hand placement, min/guard for safety, elevated bed    Ambulation/Gait Ambulation/Gait assistance: Min guard Gait Distance (Feet): 140 Feet Assistive device: Rolling walker (2 wheels) Gait Pattern/deviations: Step-to pattern;Narrow base of support;Trunk flexed Gait velocity: decreased     General Gait Details: pt initially refused ambulation but with encouragement did agree to walk in hall, tolerated increased distance today, reported B heel pain with walking, movement is rushed/impulsive, VCs to slow down, R heel dressing saturated with blood at end of session -RN notified   Stairs             Wheelchair Mobility    Modified Rankin (Stroke Patients Only)       Balance Overall balance assessment: History of Falls Sitting-balance support: Bilateral upper extremity supported;Feet supported Sitting balance-Leahy Scale: Fair     Standing balance support: Bilateral upper extremity supported;Reliant on assistive device for balance Standing balance-Leahy Scale: Poor                              Cognition Arousal/Alertness: Awake/alert Behavior During Therapy: Impulsive Overall Cognitive Status: No family/caregiver present to determine baseline cognitive functioning                                 General Comments: Decreased safety noted with in room  mobility.        Exercises      General Comments        Pertinent Vitals/Pain Pain Assessment: Faces Pain Score: 6  Faces Pain Scale: Hurts little more Pain Location: B heels with walking Pain Descriptors / Indicators: Aching;Sore Pain Intervention(s): Limited activity within patient's tolerance;Monitored during session;Repositioned;Patient  requesting pain meds-RN notified    Home Living                          Prior Function            PT Goals (current goals can now be found in the care plan section) Acute Rehab PT Goals PT Goal Formulation: With patient Time For Goal Achievement: 09/09/21 Potential to Achieve Goals: Good Progress towards PT goals: Progressing toward goals    Frequency           PT Plan Current plan remains appropriate    Co-evaluation              AM-PAC PT "6 Clicks" Mobility   Outcome Measure  Help needed turning from your back to your side while in a flat bed without using bedrails?: A Little Help needed moving from lying on your back to sitting on the side of a flat bed without using bedrails?: A Little Help needed moving to and from a bed to a chair (including a wheelchair)?: A Little Help needed standing up from a chair using your arms (e.g., wheelchair or bedside chair)?: A Little Help needed to walk in hospital room?: A Little Help needed climbing 3-5 steps with a railing? : A Lot 6 Click Score: 17    End of Session Equipment Utilized During Treatment: Gait belt Activity Tolerance: Patient limited by pain Patient left: in bed Nurse Communication: Mobility status;Other (comment) (R heel dressing saturated in blood) PT Visit Diagnosis: Difficulty in walking, not elsewhere classified (R26.2);Muscle weakness (generalized) (M62.81)     Time: 8250-0370 PT Time Calculation (min) (ACUTE ONLY): 13 min  Charges:  $Gait Training: 8-22 mins                     Ralene Bathe Kistler PT 08/31/2021  Acute Rehabilitation Services Pager [phone] Office [phone]
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
## 2021-08-31 NOTE — Assessment & Plan Note (Addendum)
RUQ [place] 06/2021 with steatosis Prolonged admission with discharge 10/4 related to alcoholic hepatitis with hepatic encephalopathy - EGD showed portal hypertensive gastropathy vs gastritis ? Hx cirrhosis, but synthetic function appears intact - normal platelets, INR, bili.  Albumin was Renette Hsu little low.  Would follow outpatient with GI Importance of etoh cessation stressed

## 2021-08-31 NOTE — Assessment & Plan Note (Signed)
Encourage cessation. °

## 2021-08-31 NOTE — Assessment & Plan Note (Addendum)
Sepsis with lactic acidosis, febrile, tachypnea, tachycardia with cellulitis Infection of R heel with necrosis MRI right heel without evidence of osteo, soft tissue ulceration and likely mild cellulitis at posterior aspect of heel ABI within normal range bilaterally Seen by Dr. Lajoyce Corners who recommended prafo,santyl dressing changes on R - follow up after discharge Ancef 11/24 - 12/2.  Will discharge on duracef for another 5 days. Blood cx NG x 5 days
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
## 2021-09-01 LAB — CBC WITH DIFFERENTIAL/PLATELET
Abs Immature Granulocytes: 0.03 10*3/uL (ref 0.00–0.07)
Basophils Absolute: 0.1 10*3/uL (ref 0.0–0.1)
Basophils Relative: 1 %
Eosinophils Absolute: 0.4 10*3/uL (ref 0.0–0.5)
Eosinophils Relative: 6 %
HCT: 39.5 % (ref 39.0–52.0)
Hemoglobin: 13.1 g/dL (ref 13.0–17.0)
Immature Granulocytes: 1 %
Lymphocytes Relative: 27 %
Lymphs Abs: 1.8 10*3/uL (ref 0.7–4.0)
MCH: 32.4 pg (ref 26.0–34.0)
MCHC: 33.2 g/dL (ref 30.0–36.0)
MCV: 97.8 fL (ref 80.0–100.0)
Monocytes Absolute: 0.9 10*3/uL (ref 0.1–1.0)
Monocytes Relative: 14 %
Neutro Abs: 3.3 10*3/uL (ref 1.7–7.7)
Neutrophils Relative %: 51 %
Platelets: 217 10*3/uL (ref 150–400)
RBC: 4.04 MIL/uL — ABNORMAL LOW (ref 4.22–5.81)
RDW: 14 % (ref 11.5–15.5)
WBC: 6.5 10*3/uL (ref 4.0–10.5)
nRBC: 0 % (ref 0.0–0.2)

## 2021-09-01 LAB — MAGNESIUM: Magnesium: 1.9 mg/dL (ref 1.7–2.4)

## 2021-09-01 LAB — COMPREHENSIVE METABOLIC PANEL
ALT: 28 U/L (ref 0–44)
AST: 48 U/L — ABNORMAL HIGH (ref 15–41)
Albumin: 3.4 g/dL — ABNORMAL LOW (ref 3.5–5.0)
Alkaline Phosphatase: 150 U/L — ABNORMAL HIGH (ref 38–126)
Anion gap: 7 (ref 5–15)
BUN: 15 mg/dL (ref 6–20)
CO2: 24 mmol/L (ref 22–32)
Calcium: 9 mg/dL (ref 8.9–10.3)
Chloride: 100 mmol/L (ref 98–111)
Creatinine, Ser: 0.59 mg/dL — ABNORMAL LOW (ref 0.61–1.24)
GFR, Estimated: >60 mL/min (ref >60)
Glucose, Bld: 104 mg/dL — ABNORMAL HIGH (ref 70–99)
Potassium: 4.1 mmol/L (ref 3.5–5.1)
Sodium: 131 mmol/L — ABNORMAL LOW (ref 135–145)
Total Bilirubin: 1 mg/dL (ref 0.3–1.2)
Total Protein: 7.8 g/dL (ref 6.5–8.1)

## 2021-09-01 LAB — PROTIME-INR
INR: 1 (ref 0.8–1.2)
Prothrombin Time: 13.4 seconds (ref 11.4–15.2)

## 2021-09-01 LAB — PHOSPHORUS: Phosphorus: 3.9 mg/dL (ref 2.5–4.6)

## 2021-09-01 MED ORDER — MIDODRINE HCL 5 MG PO TABS
10.0000 mg | ORAL_TABLET | Freq: Three times a day (TID) | ORAL | Status: DC
  Administered 2021-09-01 – 2021-09-02 (×3): 10 mg via ORAL
  Filled 2021-09-01 (×3): qty 2
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
## 2021-09-02 ENCOUNTER — Other Ambulatory Visit (HOSPITAL_COMMUNITY): Payer: Self-pay

## 2021-09-02 ENCOUNTER — Inpatient Hospital Stay (HOSPITAL_COMMUNITY): Payer: No Typology Code available for payment source

## 2021-09-02 DIAGNOSIS — R008 Other abnormalities of heart beat: Secondary | ICD-10-CM | POA: Diagnosis not present

## 2021-09-02 LAB — COMPREHENSIVE METABOLIC PANEL
ALT: 33 U/L (ref 0–44)
AST: 50 U/L — ABNORMAL HIGH (ref 15–41)
Albumin: 3.2 g/dL — ABNORMAL LOW (ref 3.5–5.0)
Alkaline Phosphatase: 147 U/L — ABNORMAL HIGH (ref 38–126)
Anion gap: 6 (ref 5–15)
BUN: 14 mg/dL (ref 6–20)
CO2: 20 mmol/L — ABNORMAL LOW (ref 22–32)
Calcium: 8.8 mg/dL — ABNORMAL LOW (ref 8.9–10.3)
Chloride: 105 mmol/L (ref 98–111)
Creatinine, Ser: 0.62 mg/dL (ref 0.61–1.24)
GFR, Estimated: >60 mL/min (ref >60)
Glucose, Bld: 102 mg/dL — ABNORMAL HIGH (ref 70–99)
Potassium: 4 mmol/L (ref 3.5–5.1)
Sodium: 131 mmol/L — ABNORMAL LOW (ref 135–145)
Total Bilirubin: 0.7 mg/dL (ref 0.3–1.2)
Total Protein: 7.8 g/dL (ref 6.5–8.1)

## 2021-09-02 LAB — CBC WITH DIFFERENTIAL/PLATELET
Abs Immature Granulocytes: 0.23 10*3/uL — ABNORMAL HIGH (ref 0.00–0.07)
Basophils Absolute: 0.2 10*3/uL — ABNORMAL HIGH (ref 0.0–0.1)
Basophils Relative: 2 %
Eosinophils Absolute: 0.6 10*3/uL — ABNORMAL HIGH (ref 0.0–0.5)
Eosinophils Relative: 7 %
HCT: 38.2 % — ABNORMAL LOW (ref 39.0–52.0)
Hemoglobin: 12.7 g/dL — ABNORMAL LOW (ref 13.0–17.0)
Immature Granulocytes: 3 %
Lymphocytes Relative: 24 %
Lymphs Abs: 2 10*3/uL (ref 0.7–4.0)
MCH: 32 pg (ref 26.0–34.0)
MCHC: 33.2 g/dL (ref 30.0–36.0)
MCV: 96.2 fL (ref 80.0–100.0)
Monocytes Absolute: 1.2 10*3/uL — ABNORMAL HIGH (ref 0.1–1.0)
Monocytes Relative: 14 %
Neutro Abs: 4.3 10*3/uL (ref 1.7–7.7)
Neutrophils Relative %: 50 %
Platelets: 283 10*3/uL (ref 150–400)
RBC: 3.97 MIL/uL — ABNORMAL LOW (ref 4.22–5.81)
RDW: 14.1 % (ref 11.5–15.5)
WBC: 8.5 10*3/uL (ref 4.0–10.5)
nRBC: 0 % (ref 0.0–0.2)

## 2021-09-02 LAB — ECHOCARDIOGRAM COMPLETE
Area-P 1/2: 5.02 cm2
Calc EF: 60.9 %
Height: 72 in
S' Lateral: 2.3 cm
Single Plane A2C EF: 64.1 %
Single Plane A4C EF: 57.5 %
Weight: 2589.08 oz

## 2021-09-02 LAB — MAGNESIUM: Magnesium: 2.2 mg/dL (ref 1.7–2.4)

## 2021-09-02 LAB — PHOSPHORUS: Phosphorus: 4.1 mg/dL (ref 2.5–4.6)

## 2021-09-02 MED ORDER — MIDODRINE HCL 10 MG PO TABS: 10.0000 mg | ORAL_TABLET | Freq: Three times a day (TID) | ORAL | 0 refills | Status: AC

## 2021-09-02 MED ORDER — COLLAGENASE 250 UNIT/GM EX OINT
TOPICAL_OINTMENT | Freq: Every day | CUTANEOUS | 0 refills | Status: DC
  Filled 2021-09-02 (×2): qty 30, 30d supply, fill #0

## 2021-09-02 MED ORDER — MIDODRINE HCL 10 MG PO TABS: 10.0000 mg | ORAL_TABLET | Freq: Three times a day (TID) | ORAL | 0 refills | Status: DC

## 2021-09-02 MED ORDER — COLLAGENASE 250 UNIT/GM EX OINT: TOPICAL_OINTMENT | Freq: Every day | CUTANEOUS | 0 refills | Status: DC

## 2021-09-02 MED ORDER — CEFADROXIL 500 MG PO CAPS: 500.0000 mg | ORAL_CAPSULE | Freq: Two times a day (BID) | ORAL | 0 refills | Status: DC

## 2021-09-02 MED ORDER — CEFADROXIL 500 MG PO CAPS
500.0000 mg | ORAL_CAPSULE | Freq: Two times a day (BID) | ORAL | 0 refills | Status: DC
  Filled 2021-09-02: qty 10, 5d supply, fill #0

## 2021-09-02 MED ORDER — ZINC OXIDE 12.8 % EX OINT: TOPICAL_OINTMENT | Freq: Four times a day (QID) | CUTANEOUS | 0 refills | Status: DC

## 2021-09-02 MED ORDER — CEFADROXIL 500 MG PO CAPS: 500.0000 mg | ORAL_CAPSULE | Freq: Two times a day (BID) | ORAL | 0 refills | Status: AC

## 2021-09-02 MED ORDER — MIDODRINE HCL 10 MG PO TABS
10.0000 mg | ORAL_TABLET | Freq: Three times a day (TID) | ORAL | 0 refills | Status: DC
  Filled 2021-09-02 (×2): qty 90, 30d supply, fill #0

## 2021-09-02 MED ORDER — ZINC OXIDE 12.8 % EX OINT
TOPICAL_OINTMENT | Freq: Four times a day (QID) | CUTANEOUS | 0 refills | Status: DC
  Filled 2021-09-02: qty 56.7, fill #0

## 2021-09-02 NOTE — Discharge Summary (Signed)
Physician Discharge Summary  Dylan Weaver QIH:[id number] DOB: Oct 24, 1965 DOA: 08/25/2021  PCP: [facility]  Admit date: 08/25/2021 Discharge date: 09/02/2021  Time spent: 40 minutes  Recommendations for Outpatient Follow-up:  Follow up outpatient CBC/CMP Follow with orthopedics outpatient  Continue wound care Continue offloading, prafo boots Follow up with gastroenterology outpatient for hepatic steatosis (?cirrhosis) Encourage etoh cessation outpatient  Follow bp outpatient  Discharge Diagnoses:  Principal Problem:   Foot infection Active Problems:   Alcohol withdrawal (HCC)   Protein-calorie malnutrition, severe   Decompensated hepatic cirrhosis (HCC)   Infection of right foot with necrosis   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)   Cellulitis   Infected superficial injury of right upper arm   Alcohol use disorder, severe, dependence (HCC)   Hypotension   Hyponatremia   Hypomagnesemia   Hyperkalemia   Elevated CK   Hepatic steatosis   Malnutrition of moderate degree   Tobacco abuse   Discharge Condition: stable  Diet recommendation: heart healthy  Filed Weights   08/26/21 0100  Weight: 73.4 kg    History of present illness:  55 yo with hx etoh abuse, malnutrition, hepatic steatosis (concern for cirrhosis), chronic thrombocytopenia who presented with bilateral lower extremity redness and swelling.  He was admitted with management of cellulitis.   Was last hospitalized from 06/04/2021 through 07/05/2021 for hepatic encephalopathy complicated by AKI, hyponatremia, hypotension and anemia with FOBT positive stools.  EGD revealed esophagitis portal hypertensive gastropathy and gastritis.  He's been seen by orthopedics for his right lower extremity pressure wound and his upper extremity laceration.  No surgical interventions at this point.  Dr. Lajoyce Corners recommending 24 hrs prafo boot bilaterally, santyl  dressing changes on R, follow up after discharge.    See below for additional details  Hospital Course:  * Foot infection Sepsis with lactic acidosis, febrile, tachypnea, tachycardia with cellulitis Infection of R heel with necrosis MRI right heel without evidence of osteo, soft tissue ulceration and likely mild cellulitis at posterior aspect of heel ABI within normal range bilaterally Seen by Dr. Lajoyce Corners who recommended prafo,santyl dressing changes on R - follow up after discharge Ancef 11/24 - 12/2.  Will discharge on duracef for another 5 days. Blood cx NG x 5 days   Infected superficial injury of right upper arm Seen by Dr. Frazier Butt on 11/25, recommended IV abx Occurred after cut from [name] bottle? He got tetanus shot after this injury [name] few weeks ago.   Alcohol use disorder, severe, dependence (HCC) Drinks 3-12 beers most days No sx of withdrawal Long discussion on dangers today, encourage cessation  Hypotension Midodrine, 10 mg TID  S/p albumin He's asymptomatic - bp's continue in the 90's , occasionally 100's Related to ?cirrhosis Echo without concerning findings  Hyperkalemia improved  Hypomagnesemia follow  Hyponatremia Mild, follow  Hepatic steatosis RUQ [place] 06/2021 with steatosis Prolonged admission with discharge 10/4 related to alcoholic hepatitis with hepatic encephalopathy - EGD showed portal hypertensive gastropathy vs gastritis ? Hx cirrhosis, but synthetic function appears intact - normal platelets, INR, bili.  Albumin was [name] little low.  Would follow outpatient with GI Importance of etoh cessation stressed  Elevated CK improved  Tobacco abuse Encourage cessation  Malnutrition of moderate degree supplements  Pressure Injury 08/26/21 Buttocks Right Unstageable - Full thickness tissue loss in which the base of the injury is covered by slough (yellow, tan, gray, green or brown) and/or eschar (tan, brown or black) in  the wound bed. (Active)  08/26/21  0231  Location: Buttocks  Location Orientation: Right  Staging: Unstageable - Full thickness tissue loss in which the base of the injury is covered by slough (yellow, tan, gray, green or brown) and/or eschar (tan, brown or black) in the wound bed.  Wound Description (Comments):   Present on Admission: Yes     Pressure Injury 08/26/21 Heel Right Unstageable - Full thickness tissue loss in which the base of the injury is covered by slough (yellow, tan, gray, green or brown) and/or eschar (tan, brown or black) in the wound bed. (Active)  08/26/21 0232  Location: Heel  Location Orientation: Right  Staging: Unstageable - Full thickness tissue loss in which the base of the injury is covered by slough (yellow, tan, gray, green or brown) and/or eschar (tan, brown or black) in the wound bed.  Wound Description (Comments):   Present on Admission: Yes        Procedures: Echo IMPRESSIONS     1. Left ventricular ejection fraction, by estimation, is 60 to 65%. The  left ventricle has normal function. The left ventricle has no regional  wall motion abnormalities. There is mild left ventricular hypertrophy.  Left ventricular diastolic parameters  were normal.   2. Right ventricular systolic function is normal. The right ventricular  size is normal. There is normal pulmonary artery systolic pressure. The  estimated right ventricular systolic pressure is 34.4 mmHg.   3. The mitral valve is normal in structure. Trivial mitral valve  regurgitation. No evidence of mitral stenosis.   4. The aortic valve is tricuspid. Aortic valve regurgitation is not  visualized. No aortic stenosis is present.   5. The inferior vena cava is dilated in size with >50% respiratory  variability, suggesting right atrial pressure of 8 mmHg.   Consultations: orthopedics  Discharge Exam: Vitals:   09/02/21 1144 09/02/21 1211  BP: 92/60 107/69  Pulse: 77 76  Resp: 16 18  Temp: 97.9 F (36.6 C) 97.7 F (36.5 C)   SpO2: 97% 96%   No complaints Discussed d/c plan  General: No acute distress. Cardiovascular: RRR Lungs: unlabored Abdomen: Soft, nontender, nondistended  Neurological: Alert and oriented 3. Moves all extremities 4. Cranial nerves II through XII grossly intact. Skin: blanching redness to buttocks, masd appears tobe improving Extremities: unstabeable wound to R heel, scattered abrasions and scabs to bilateral lower extermities - RUE laceration improving  Discharge Instructions   Discharge Instructions     Call MD for:  difficulty breathing, headache or visual disturbances   Complete by: As directed    Call MD for:  extreme fatigue   Complete by: As directed    Call MD for:  hives   Complete by: As directed    Call MD for:  persistant dizziness or light-headedness   Complete by: As directed    Call MD for:  persistant nausea and vomiting   Complete by: As directed    Call MD for:  redness, tenderness, or signs of infection (pain, swelling, redness, odor or green/yellow discharge around incision site)   Complete by: As directed    Call MD for:  severe uncontrolled pain   Complete by: As directed    Call MD for:  temperature >100.4   Complete by: As directed    Diet - low sodium heart healthy   Complete by: As directed    Discharge instructions   Complete by: As directed    You were seen for lower extremity  wounds and skin infection.  You have Mina Carlisi wound on the right heel that needs fclose follow up.  We've treated you with antibiotics and will send you with another 5 days of antibiotics.  It's extremely important that you stop drinking.  Your alcohol use has placed you in the hospital these past 2 admissions with hepatic encephalopathy and evidence of cirrhosis (liver failure) on the previous admission.  Your prolonged time down on the ground led to your heel injury and the prolonged time down in your stool led to the moisture associated skin damage to your groin and buttocks.   If you continue drinking, you risk further complications and repeat hospitalizations, injury, death and disability.  Please follow up with Tashara Suder gastroenterologist as an outpatient to follow your liver function.  We've increased your midodrine to 10 mg 3 times daily for your low blood pressures.    Continue wound care as instructed.  Apply Santyl to right heel wound in Lester Crickenberger nickel thick layer. Cover with 4x4 gauze, then ace wrap.  Change daily.  Apply Triple Paste AFTER washing with soap and water and patting dry to your buttocks, sacrum, coccyx, upper thighs, penis, and bilateral groin areas.  Wash the most distal right forearm wound with soap and water, pat dry. Place Errick Salts size appropriate piece of Xeroform over the wound, secure with kerlix. To remove, saturate with saline or water, then replace the dressing daily.  Offload your right heel.  Try to keep weight off it.  Dr. Lajoyce Corners recommends the prafo boot 24 hours Ioan Landini day to both legs.  Please follow up with Dr. Lajoyce Corners as an outpatient.   Return for new, recurrent, or worsening symptoms.  Please ask your PCP to request records from this hospitalization so they know what was done and what the next steps will be.   Discharge wound care:   Complete by: As directed    Continue wound care as instructed.   Apply Santyl to right heel wound in Ashle Stief nickel thick layer. Cover with 4x4 gauze, then ace wrap.  Change daily.  Apply Triple Paste AFTER washing with soap and water and patting dry to your buttocks, sacrum, coccyx, upper thighs, penis, and bilateral groin areas.  Wash the most distal right forearm wound with soap and water, pat dry. Place Cherissa Hook size appropriate piece of Xeroform over the wound, secure with kerlix. To remove, saturate with saline or water, then replace the dressing daily.   Increase activity slowly   Complete by: As directed       Allergies as of 09/02/2021   No Known Allergies      Medication List     STOP taking these medications     Gerhardt's butt cream Crea       TAKE these medications    cefadroxil 500 MG capsule Commonly known as: DURICEF Take 1 capsule by mouth twice daily for 5 days.   collagenase ointment Commonly known as: SANTYL Apply topically daily. To right heel daily per wound care instructions. Start taking on: September 03, 2021   cyanocobalamin 1000 MCG tablet Take 1 tablet (1,000 mcg total) by mouth daily.   famotidine 40 MG tablet Commonly known as: PEPCID Take 1 tablet (40 mg total) by mouth 2 (two) times daily.   folic acid 1 MG tablet Commonly known as: FOLVITE Take 1 tablet (1 mg total) by mouth daily.   gabapentin 100 MG capsule Commonly known as: NEURONTIN Take 1 capsule (100 mg total) by mouth 3 (three)  times daily.   magnesium oxide 400 (240 Mg) MG tablet Commonly known as: MAG-OX Take 2 tablets (800 mg total) by mouth 2 (two) times daily.   midodrine 10 MG tablet Commonly known as: PROAMATINE Take 1 tablet (10 mg total) by mouth 3 (three) times daily with meals. What changed:  medication strength how much to take   multivitamin with minerals Tabs tablet Take 1 tablet by mouth daily before breakfast.   Zinc Oxide 12.8 % ointment Commonly known as: TRIPLE PASTE Apply topically 4 times daily. Apply to buttocks, groin areas bilaterally, penis, sacrum, coccyx, upper thighs after washing with soap and water and patting dry               Discharge Care Instructions  (From admission, onward)           Start     Ordered   09/02/21 0000  Discharge wound care:       Comments: Continue wound care as instructed.   Apply Santyl to right heel wound in Eliaz Fout nickel thick layer. Cover with 4x4 gauze, then ace wrap.  Change daily.  Apply Triple Paste AFTER washing with soap and water and patting dry to your buttocks, sacrum, coccyx, upper thighs, penis, and bilateral groin areas.  Wash the most distal right forearm wound with soap and water, pat dry. Place Anjuli Gemmill size  appropriate piece of Xeroform over the wound, secure with kerlix. To remove, saturate with saline or water, then replace the dressing daily.   09/02/21 1016           No Known Allergies  Follow-up Information     Nadara Mustard, MD Follow up in 1 week(s).   Specialty: Orthopedic Surgery Contact information: [address] [place] [address] [phone]         [facility] Follow [facility].   Why: Dr. Victorino Dike Sargent;Substance abuse intake-once independent can walk in M-F 8:30a-11:30a Contact information: [address] [facility] [place] [address] [phone]         Jobe Gibbon, [facility] Follow up.   Specialty: Home Health Services Why: contact rep  Angela [phone].HH nursing-instruction-wound care;HH physical therapy Contact information: [address] [place] [address] [phone]         [facility]   Contact information: [address] [facility] [address] [place] [address] [phone]                  The results of significant diagnostics from this hospitalization (including imaging, microbiology, ancillary and laboratory) are listed below for reference.    Significant Diagnostic Studies: DG Forearm Right  Result Date: 08/26/2021 CLINICAL DATA:  Possible retained glass EXAM: RIGHT FOREARM - 2 VIEW COMPARISON:  08/08/2021 FINDINGS: No fracture or malalignment. The previously noted linear radiopacity adjacent to shaft of ulna is not visualized. Punctate densities over the skin at the level of the wound are nonspecific. IMPRESSION: Soft tissue wound at the distal forearm. The previously noted suspected foreign bodies are no longer visualized Electronically Signed   By: Jasmine Pang M.D.   On: 08/26/2021 19:55   DG Forearm Right  Result Date: 08/08/2021 CLINICAL DATA:  Trauma, skin lacerations EXAM: RIGHT FOREARM - 2 VIEW COMPARISON:  None. FINDINGS: No recent fracture  or dislocation is seen. There are pockets of air in the soft tissues along with irregularity in the skin along the dorsal aspect suggesting lacerations. In the lateral view, there is thin linear  radiopacity in the soft tissues along the dorsal aspect of mid shaft of ulna. This finding could not be localized in the AP view. IMPRESSION: No fracture or dislocation is seen. Pockets of air in the soft tissues suggest open wound in the skin. There is thin linear radiopacity along the dorsal aspect of mid shaft of ulna seen only in the lateral view. This may be an artifact or suggest foreign body on the skin or in the soft tissues. Electronically Signed   By: Ernie Avena M.D.   On: 08/08/2021 09:51   DG Tibia/Fibula Left  Result Date: 08/25/2021 CLINICAL DATA:  Pain after questionable injury.  Swelling. EXAM: LEFT TIBIA AND FIBULA - 2 VIEW COMPARISON:  None. FINDINGS: There is no evidence of fracture or other focal bone lesions. Soft tissues are unremarkable. IMPRESSION: Negative. Electronically Signed   By: Gerome Sam III M.D.   On: 08/25/2021 19:34   DG Tibia/Fibula Right  Result Date: 08/25/2021 CLINICAL DATA:  Pain.  Questionable injury. EXAM: RIGHT TIBIA AND FIBULA - 2 VIEW COMPARISON:  None. FINDINGS: There is no evidence of fracture or other focal bone lesions. Soft tissues are unremarkable. IMPRESSION: Negative. Electronically Signed   By: Gerome Sam III M.D.   On: 08/25/2021 19:33   MR FOOT RIGHT W WO CONTRAST  Result Date: 08/26/2021 CLINICAL DATA:  Infection, foot wound EXAM: MRI OF THE RIGHT FOREFOOT WITHOUT AND WITH CONTRAST TECHNIQUE: Multiplanar, multisequence MR imaging of the right hindfoot was performed before and after the administration of intravenous contrast. CONTRAST:  7.4mL GADAVIST GADOBUTROL 1 MMOL/ML IV SOLN COMPARISON:  Right foot x-ray 08/25/2021 FINDINGS: Study is limited due to motion. Bones/Joint/Cartilage No acute fracture identified. Note is made of  previous bone infarcts in the distal tibia measuring 2.5 x 2.6 x 8 cm and in the posterior calcaneus measuring 3 x 2 x 2.3 cm. No bone marrow edema identified to suggest acute osteomyelitis. Degenerative subchondral edema and small cysts at the mid talar dome. Foci of subchondral degenerative edema at the calcaneocuboid joint noted. Ligaments No acute ligamentous injury visualized. Muscles and Tendons Tendons of the ankle are intact with normal signal. Visualized musculature is unremarkable. Soft tissues Soft tissue wound at the posterior aspect of the heel with mild underlying subcutaneous soft tissue edema and enhancement. No defined fluid collection/abscess identified. IMPRESSION: 1. No evidence of acute osteomyelitis visualized. 2. Soft tissue ulceration and likely mild cellulitis at the posterior aspect of the heel. 3. 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 4. Other degenerative changes as described. Electronically Signed   By: Jannifer Hick M.D.   On: 08/26/2021 15:43   DG Chest Port 1 View  Result Date: 08/25/2021 CLINICAL DATA:  Bilateral leg pain and swelling. EXAM: PORTABLE CHEST 1 VIEW COMPARISON:  06/04/2021 FINDINGS: Lungs are adequately inflated without consolidation or effusion. Cardiomediastinal silhouette and remainder of the chest is unchanged. IMPRESSION: No active disease. Electronically Signed   By: Elberta Fortis M.D.   On: 08/25/2021 18:58   DG Foot Complete Left  Result Date: 08/25/2021 CLINICAL DATA:  Pain and swell lung after questionable injury. EXAM: LEFT FOOT - COMPLETE 3+ VIEW COMPARISON:  None. FINDINGS: Mild irregularity of the base of the second proximal phalanx on the oblique view. No other acute abnormalities. IMPRESSION: Mild irregularity at the base of the second proximal phalanx could represent Verlie Liotta subtle fracture. Recommend clinical correlation for point tenderness in this region. No other abnormalities. Electronically Signed   By: Onalee Hua  Mayford Knife III  M.D.   On: 08/25/2021 19:35   DG Foot Complete Right  Result Date: 08/25/2021 CLINICAL DATA:  Pain and swelling after possible injury. EXAM: RIGHT FOOT COMPLETE - 3+ VIEW COMPARISON:  None. FINDINGS: There is no evidence of fracture or dislocation. There is no evidence of arthropathy or other focal bone abnormality. Soft tissues are unremarkable. IMPRESSION: Negative. Electronically Signed   By: Gerome Sam III M.D.   On: 08/25/2021 19:37   VAS [place] ABI WITH/WO TBI  Result Date: 08/27/2021  LOWER EXTREMITY DOPPLER STUDY Patient Name:  Rohail Klees  Date of Exam:   08/26/2021 Medical Rec #: [id number]                Accession #:    [id number] Date of Birth: 02-15-1966                Patient Gender: M Patient Age:   27 years Exam Location:  [facility] Procedure:      VAS [place] ABI WITH/WO TBI Referring Phys: Calvert Cantor --------------------------------------------------------------------------------  Indications: RT foot infection & BLE heel wounds. High Risk Factors: Current smoker.  Comparison Study: No previous exams Performing Technologist: Hill, Jody RVT, RDMS  Examination Guidelines: Jebidiah Baggerly complete evaluation includes at minimum, Doppler waveform signals and systolic blood pressure reading at the level of bilateral brachial, anterior tibial, and posterior tibial arteries, when vessel segments are accessible. Bilateral testing is considered an integral part of Yonah Tangeman complete examination. Photoelectric Plethysmograph (PPG) waveforms and toe systolic pressure readings are included as required and additional duplex testing as needed. Limited examinations for reoccurring indications may be performed as noted.  ABI Findings: +--------+------------------+-----+---------+--------+ Right   Rt Pressure (mmHg)IndexWaveform Comment  +--------+------------------+-----+---------+--------+ [id number]                    triphasic         +--------+------------------+-----+---------+--------+  PTA     142               1.10 biphasic          +--------+------------------+-----+---------+--------+ DP      144               1.12 biphasic          +--------+------------------+-----+---------+--------+ +--------+------------------+-----+---------+---------+ Left    Lt Pressure (mmHg)IndexWaveform Comment   +--------+------------------+-----+---------+---------+ Brachial                       triphasicIV to LUE +--------+------------------+-----+---------+---------+ PTA     149               1.16 biphasic           +--------+------------------+-----+---------+---------+ DP      130               1.01 biphasic           +--------+------------------+-----+---------+---------+  Summary: Right: Resting right ankle-brachial index is within normal range. No evidence of significant right lower extremity arterial disease. Left: Resting left ankle-brachial index is within normal range. No evidence of significant left lower extremity arterial disease.  *See table(s) above for measurements and observations.  Electronically signed by Gerarda Fraction on 08/27/2021 at 10:40:03 AM.    Final    ECHOCARDIOGRAM COMPLETE  Result Date: 09/02/2021    ECHOCARDIOGRAM REPORT   Patient Name:   Shane Drue Flirt Date of Exam: 09/02/2021 Medical Rec #:  [id number]  Height:       72.0 in Accession #:    [id number]              Weight:       161.8 lb Date of Birth:  10/25/1965               BSA:          1.947 m Patient Age:    55 years                BP:           93/61 mmHg Patient Gender: M                       HR:           68 bpm. Exam Location:  Inpatient Procedure: 2D Echo, 3D Echo, Cardiac Doppler and Color Doppler Indications:    R00.8 Other abnormalities of heart  History:        Patient has no prior history of Echocardiogram examinations.                 Abnormal ECG; Risk Factors:Current Smoker. ETOH.  Sonographer:    Sheralyn Boatman RDCS Referring Phys: [phone] Kalel Harty CALDWELL POWELL  JR IMPRESSIONS  1. Left ventricular ejection fraction, by estimation, is 60 to 65%. The left ventricle has normal function. The left ventricle has no regional wall motion abnormalities. There is mild left ventricular hypertrophy. Left ventricular diastolic parameters were normal.  2. Right ventricular systolic function is normal. The right ventricular size is normal. There is normal pulmonary artery systolic pressure. The estimated right ventricular systolic pressure is 34.4 mmHg.  3. The mitral valve is normal in structure. Trivial mitral valve regurgitation. No evidence of mitral stenosis.  4. The aortic valve is tricuspid. Aortic valve regurgitation is not visualized. No aortic stenosis is present.  5. The inferior vena cava is dilated in size with >50% respiratory variability, suggesting right atrial pressure of 8 mmHg. FINDINGS  Left Ventricle: Left ventricular ejection fraction, by estimation, is 60 to 65%. The left ventricle has normal function. The left ventricle has no regional wall motion abnormalities. 3D left ventricular ejection fraction analysis performed but not reported based on interpreter judgement due to suboptimal tracking. The left ventricular internal cavity size was normal in size. There is mild left ventricular hypertrophy. Left ventricular diastolic parameters were normal. Right Ventricle: The right ventricular size is normal. No increase in right ventricular wall thickness. Right ventricular systolic function is normal. There is normal pulmonary artery systolic pressure. The tricuspid regurgitant velocity is 2.57 m/s, and  with an assumed right atrial pressure of 8 mmHg, the estimated right ventricular systolic pressure is 34.4 mmHg. Left Atrium: Left atrial size was normal in size. Right Atrium: Right atrial size was normal in size. Prominent Eustachian valve. Pericardium: There is no evidence of pericardial effusion. Mitral Valve: The mitral valve is normal in structure. Trivial mitral  valve regurgitation. No evidence of mitral valve stenosis. Tricuspid Valve: The tricuspid valve is normal in structure. Tricuspid valve regurgitation is trivial. Aortic Valve: The aortic valve is tricuspid. Aortic valve regurgitation is not visualized. No aortic stenosis is present. Pulmonic Valve: The pulmonic valve was not well visualized. Pulmonic valve regurgitation is trivial. Aorta: The aortic root and ascending aorta are structurally normal, with no evidence of dilitation. Venous: The inferior vena cava is dilated in size with greater than 50% respiratory variability, suggesting right atrial pressure of  8 mmHg. IAS/Shunts: The interatrial septum was not well visualized.  LEFT VENTRICLE PLAX 2D LVIDd:         4.30 cm     Diastology LVIDs:         2.30 cm     LV e' medial:    8.70 cm/s LV PW:         1.10 cm     LV E/e' medial:  9.4 LV IVS:        1.00 cm     LV e' lateral:   12.20 cm/s LVOT diam:     2.20 cm     LV E/e' lateral: 6.7 LV SV:         76 LV SV Index:   39 LVOT Area:     3.80 cm                             3D Volume EF: LV Volumes (MOD)           3D EF:        58 % LV vol d, MOD A2C: 86.7 ml LV EDV:       112 ml LV vol d, MOD A4C: 80.3 ml LV ESV:       47 ml LV vol s, MOD A2C: 31.1 ml LV SV:        65 ml LV vol s, MOD A4C: 34.1 ml LV SV MOD A2C:     55.6 ml LV SV MOD A4C:     80.3 ml LV SV MOD BP:      51.9 ml RIGHT VENTRICLE             IVC RV S prime:     12.90 cm/s  IVC diam: 2.10 cm TAPSE (M-mode): 2.2 cm LEFT ATRIUM             Index        RIGHT ATRIUM           Index LA diam:        4.40 cm 2.26 cm/m   RA Area:     15.80 cm LA Vol (A2C):   58.7 ml 30.15 ml/m  RA Volume:   40.20 ml  20.65 ml/m LA Vol (A4C):   66.6 ml 34.20 ml/m LA Biplane Vol: 64.4 ml 33.07 ml/m  AORTIC VALVE LVOT Vmax:   93.40 cm/s LVOT Vmean:  62.600 cm/s LVOT VTI:    0.200 m  AORTA Ao Root diam: 3.30 cm Ao Asc diam:  3.10 cm MITRAL VALVE               TRICUSPID VALVE MV Area (PHT): 5.02 cm    TR Peak grad:   26.4  mmHg MV Decel Time: 151 msec    TR Vmax:        257.00 cm/s MV E velocity: 81.80 cm/s MV Zelma Snead velocity: 42.00 cm/s  SHUNTS MV E/Mae Denunzio ratio:  1.95        Systemic VTI:  0.20 m                            Systemic Diam: 2.20 cm Epifanio Lesches MD Electronically signed by Epifanio Lesches MD Signature Date/Time: 09/02/2021/10:43:37 AM    Final     Microbiology: Recent Results (from the past 240 hour(s))  Culture, blood (Routine X 2) w Reflex to ID Panel  Status: None   Collection Time: 08/25/21  6:05 PM   Specimen: BLOOD  Result Value Ref Range Status   Specimen Description   Final    BLOOD BLOOD RIGHT HAND Performed at [facility], [address] [place], [place] [address]    Special Requests   Final    BOTTLES DRAWN AEROBIC AND ANAEROBIC Blood Culture adequate volume Performed at [facility], [address], [place], [place] [address]    Culture   Final    NO GROWTH 5 DAYS Performed at [facility], [address] [place], [place] [address]    Report Status 08/30/2021 FINAL  Final  Culture, blood (Routine X 2) w Reflex to ID Panel     Status: None   Collection Time: 08/25/21  6:07 PM   Specimen: BLOOD  Result Value Ref Range Status   Specimen Description   Final    BLOOD BLOOD LEFT ARM Performed at [facility], [address] [place], [place] [address]    Special Requests   Final    BOTTLES DRAWN AEROBIC AND ANAEROBIC Blood Culture adequate volume Performed at [facility], [address], [place], [place] [address]    Culture   Final    NO GROWTH 5 DAYS Performed at [facility], [address] [place], [place] [address]    Report Status 08/30/2021 FINAL  Final  Resp Panel by RT-PCR (Flu Melven Stockard&B, Covid) Nasopharyngeal Swab     Status: None   Collection Time: 08/25/21  9:33 PM   Specimen: Nasopharyngeal Swab; Nasopharyngeal(NP) swabs in vial transport medium  Result Value Ref Range  Status   SARS Coronavirus 2 by RT PCR NEGATIVE NEGATIVE Final    Comment: (NOTE) SARS-CoV-2 target nucleic acids are NOT DETECTED.  The SARS-CoV-2 RNA is generally detectable in upper respiratory specimens during the acute phase of infection. The lowest concentration of SARS-CoV-2 viral copies this assay can detect is 138 copies/mL. Darah Simkin negative result does not preclude SARS-Cov-2 infection and should not be used as the sole basis for treatment or other patient management decisions. Mateusz Neilan negative result may occur with  improper specimen collection/handling, submission of specimen other than nasopharyngeal swab, presence of viral mutation(s) within the areas targeted by this assay, and inadequate number of viral copies(<138 copies/mL). Sundiata Ferrick negative result must be combined with clinical observations, patient history, and epidemiological information. The expected result is Negative.  Fact Sheet for Patients:  BloggerCourse.com  Fact Sheet for Healthcare Providers:  SeriousBroker.it  This test is no t yet approved or cleared by the [place] FDA and  has been authorized for detection and/or diagnosis of SARS-CoV-2 by FDA under an Emergency Use Authorization (EUA). This EUA will remain  in effect (meaning this test can be used) for the duration of the COVID-19 declaration under Section 564(b)(1) of the Act, 21 U.S.C.section 360bbb-3(b)(1), unless the authorization is terminated  or revoked sooner.       Influenza Amarian Botero by PCR NEGATIVE NEGATIVE Final   Influenza B by PCR NEGATIVE NEGATIVE Final    Comment: (NOTE) The Xpert Xpress SARS-CoV-2/FLU/RSV plus assay is intended as an aid in the diagnosis of influenza from Nasopharyngeal swab specimens and should not be used as Ameah Chanda sole basis for treatment. Nasal washings and aspirates are unacceptable for Xpert Xpress SARS-CoV-2/FLU/RSV testing.  Fact Sheet for  Patients: BloggerCourse.com  Fact Sheet for Healthcare Providers: SeriousBroker.it  This test is not yet approved or cleared  by the [place] and has been authorized for detection and/or diagnosis of SARS-CoV-2 by FDA under an Emergency Use Authorization (EUA). This EUA will remain in effect (meaning this test can be used) for the duration of the COVID-19 declaration under Section 564(b)(1) of the Act, 21 U.S.C. section 360bbb-3(b)(1), unless the authorization is terminated or revoked.  Performed at [facility], [address] [place], [place] [address]      Labs: Basic Metabolic Panel: Recent Labs  Lab 08/27/21 0500 08/29/21 0446 08/31/21 0926 09/01/21 0431 09/02/21 0518  NA 131* 132* 131* 131* 131*  K 3.7 3.8 4.2 4.1 4.0  CL 101 102 102 100 105  CO2 23 22 21* 24 20*  GLUCOSE 104* 105* 107* 104* 102*  BUN 8 15 16 15 14   CREATININE 0.61 0.47* 0.58* 0.59* 0.62  CALCIUM 8.4* 8.7* 8.6* 9.0 8.8*  MG  --  1.8  --  1.9 2.2  PHOS  --   --   --  3.9 4.1   Liver Function Tests: Recent Labs  Lab 08/31/21 0926 09/01/21 0431 09/02/21 0518  AST 52* 48* 50*  ALT 28 28 33  ALKPHOS 147* 150* 147*  BILITOT 0.7 1.0 0.7  PROT 7.6 7.8 7.8  ALBUMIN 2.8* 3.4* 3.2*   No results for input(s): LIPASE, AMYLASE in the last 168 hours. No results for input(s): AMMONIA in the last 168 hours. CBC: Recent Labs  Lab 08/27/21 0500 08/29/21 0446 08/31/21 0926 09/01/21 0431 09/02/21 0518  WBC 5.9 6.9 8.0 6.5 8.5  NEUTROABS  --   --  4.9 3.3 4.3  HGB 11.4* 12.7* 12.2* 13.1 12.7*  HCT 34.1* 37.7* 36.5* 39.5 38.2*  MCV 95.8 94.3 95.3 97.8 96.2  PLT 202 231 272 217 283   Cardiac Enzymes: Recent Labs  Lab 08/27/21 0500  CKTOTAL 279   BNP: BNP (last 3 results) Recent Labs    08/25/21 1805  BNP 25.8    ProBNP (last 3 results) No results for input(s): PROBNP in the last 8760 hours.  CBG: Recent  Labs  Lab 08/28/21 2038  GLUCAP 105*       Signed:  Lacretia Nicks MD.  [facility] 09/02/2021, 1:21 PM

## 2021-09-02 NOTE — Progress Notes (Signed)
D/C instructions reviewed w/ pt. Pt verbalizes understanding and all questions answered. Dressing change instructions reviewed w/ pt and pt is able to teach back instructions, he states Misty Stanley will be able to handle assisting him if needed. Pt provided with all supplies needed for dressing change including kling wrap, 4x4 gauze, tape, cotton tip applicators for santyl application. Pt state he will be unable to get to the [place] to pick up his scripts until MOnday. Confirmed with [facility] on [address] that they have his scripts there and will be able to give him 3 days worth of his oral scripts to get him through until Monday. Pt aware and agreeable. [place] transportation services called for door to door  transport home. They state transportation should be here within the next 30 minutes. Pt aware and agreeable.

## 2021-09-02 NOTE — Progress Notes (Signed)
  Echocardiogram 2D Echocardiogram has been performed.  Dylan Weaver 09/02/2021, 9:25 AM

## 2021-09-02 NOTE — Plan of Care (Signed)
  Problem: Education: Goal: Knowledge of General Education information will improve Description Including pain rating scale, medication(s)/side effects and non-pharmacologic comfort measures Outcome: Progressing   

## 2021-09-02 NOTE — Plan of Care (Signed)
  Problem: Education: Goal: Knowledge of General Education information will improve Description: Including pain rating scale, medication(s)/side effects and non-pharmacologic comfort measures Outcome: Completed/Met

## 2021-09-02 NOTE — TOC Transition Note (Addendum)
Transition of Care [facility]) - CM/SW Discharge Note   Patient Details  Name: Dylan Weaver MRN: [id number] Date of Birth: 1966/04/21  Transition of Care [facility]) CM/SW Contact:  Lanier Clam, RN Phone Number: 09/02/2021, 11:43 AM   Clinical Narrative: [facility] Brianna-recc to email(preferred) or fax scripts to [email or web address] or fax to [phone] pcp Dr. [name] virtual provider-the [facility] will manage all processes for [facility], & delivery /pick up.MD/pharmacy/nsg updated. CM will email/fax d/c summary to [place] HHRN/PT-instruction wound care. Nsg to manage safe ride w/c transport. No further CM needs.   -[facility] cannot guarantee pick [facility] today for scripts-they have their own process for approval,delivery/& pick up time.MD updated. 1p-faxed scripts w/confirmation to [facility]. 1:43p-faxed w/confirmation d/c summary-fax#[phone].     Final next level of care: Home w Home Health Services Barriers to Discharge: No Barriers Identified   Patient Goals and CMS Choice Patient states their goals for this hospitalization and ongoing recovery are:: To go to SNF for short term rehab, then return back home. CMS Medicare.gov Compare Post Acute Care list provided to:: Patient Choice offered to / list presented to : Patient  Discharge Placement                       Discharge Plan and Services     Post Acute Care Choice: Skilled Nursing Facility                               Social Determinants of Health (SDOH) Interventions     Readmission Risk Interventions No flowsheet data found.

## 2021-09-02 NOTE — TOC Transition Note (Incomplete)
Transition of Care [facility]) - CM/SW Discharge Note   Patient Details  Name: Dylan Weaver MRN: [id number] Date of Birth: [birth year]  Transition [facility]) CM/SW Contact:  Lanier Clam, RN Phone Number: 09/02/2021, 1:07 PM   Clinical Narrative: Faxed w/confirmation scripts to [employer] csw.       Final next level of care: Home w Home Health Services Barriers to Discharge: No Barriers Identified   Patient Goals and CMS Choice Patient states their goals for this hospitalization and ongoing recovery are:: To go to SNF for short term rehab, then return back home. CMS Medicare.gov Compare Post Acute Care list provided to:: Patient Choice offered to / list presented to : Patient  Discharge Placement                       Discharge Plan and Services     Post Acute Care Choice: Skilled Nursing Facility                               Social Determinants of Health (SDOH) Interventions     Readmission Risk Interventions No flowsheet data found.

## 2021-09-20 ENCOUNTER — Other Ambulatory Visit: Payer: Self-pay

## 2021-09-20 ENCOUNTER — Encounter (HOSPITAL_BASED_OUTPATIENT_CLINIC_OR_DEPARTMENT_OTHER): Payer: Self-pay

## 2021-09-20 ENCOUNTER — Emergency Department (HOSPITAL_BASED_OUTPATIENT_CLINIC_OR_DEPARTMENT_OTHER): Payer: No Typology Code available for payment source

## 2021-09-20 ENCOUNTER — Emergency Department (HOSPITAL_BASED_OUTPATIENT_CLINIC_OR_DEPARTMENT_OTHER)
Admission: EM | Admit: 2021-09-20 | Discharge: 2021-09-20 | Disposition: A | Discharge status: Home / Self Care | Payer: No Typology Code available for payment source | Attending: Emergency Medicine | Admitting: Emergency Medicine

## 2021-09-20 DIAGNOSIS — S0121XA Laceration without foreign body of nose, initial encounter: Secondary | ICD-10-CM | POA: Insufficient documentation

## 2021-09-20 DIAGNOSIS — Z20822 Contact with and (suspected) exposure to covid-19: Secondary | ICD-10-CM | POA: Insufficient documentation

## 2021-09-20 DIAGNOSIS — Y907 Blood alcohol level of 200-239 mg/100 ml: Secondary | ICD-10-CM | POA: Insufficient documentation

## 2021-09-20 DIAGNOSIS — Z23 Encounter for immunization: Secondary | ICD-10-CM | POA: Diagnosis not present

## 2021-09-20 DIAGNOSIS — F1010 Alcohol abuse, uncomplicated: Secondary | ICD-10-CM

## 2021-09-20 DIAGNOSIS — S0181XA Laceration without foreign body of other part of head, initial encounter: Secondary | ICD-10-CM

## 2021-09-20 DIAGNOSIS — W19XXXA Unspecified fall, initial encounter: Secondary | ICD-10-CM

## 2021-09-20 DIAGNOSIS — N179 Acute kidney failure, unspecified: Secondary | ICD-10-CM | POA: Insufficient documentation

## 2021-09-20 DIAGNOSIS — W1839XA Other fall on same level, initial encounter: Secondary | ICD-10-CM | POA: Diagnosis not present

## 2021-09-20 DIAGNOSIS — F17210 Nicotine dependence, cigarettes, uncomplicated: Secondary | ICD-10-CM | POA: Diagnosis not present

## 2021-09-20 DIAGNOSIS — S0990XA Unspecified injury of head, initial encounter: Secondary | ICD-10-CM | POA: Diagnosis present

## 2021-09-20 LAB — DIFFERENTIAL
Abs Immature Granulocytes: 0.01 10*3/uL (ref 0.00–0.07)
Basophils Absolute: 0.1 10*3/uL (ref 0.0–0.1)
Basophils Relative: 1 %
Eosinophils Absolute: 0.2 10*3/uL (ref 0.0–0.5)
Eosinophils Relative: 4 %
Immature Granulocytes: 0 %
Lymphocytes Relative: 32 %
Lymphs Abs: 1.7 10*3/uL (ref 0.7–4.0)
Monocytes Absolute: 0.4 10*3/uL (ref 0.1–1.0)
Monocytes Relative: 8 %
Neutro Abs: 3 10*3/uL (ref 1.7–7.7)
Neutrophils Relative %: 55 %

## 2021-09-20 LAB — COMPREHENSIVE METABOLIC PANEL
ALT: 49 U/L — ABNORMAL HIGH (ref 0–44)
AST: 91 U/L — ABNORMAL HIGH (ref 15–41)
Albumin: 3.5 g/dL (ref 3.5–5.0)
Alkaline Phosphatase: 213 U/L — ABNORMAL HIGH (ref 38–126)
Anion gap: 13 (ref 5–15)
BUN: <5 mg/dL — ABNORMAL LOW (ref 6–20)
CO2: 25 mmol/L (ref 22–32)
Calcium: 8.7 mg/dL — ABNORMAL LOW (ref 8.9–10.3)
Chloride: 102 mmol/L (ref 98–111)
Creatinine, Ser: 0.58 mg/dL — ABNORMAL LOW (ref 0.61–1.24)
GFR, Estimated: >60 mL/min (ref >60)
Glucose, Bld: 108 mg/dL — ABNORMAL HIGH (ref 70–99)
Potassium: 3.5 mmol/L (ref 3.5–5.1)
Sodium: 140 mmol/L (ref 135–145)
Total Bilirubin: 0.9 mg/dL (ref 0.3–1.2)
Total Protein: 9 g/dL — ABNORMAL HIGH (ref 6.5–8.1)

## 2021-09-20 LAB — CBC
HCT: 42.6 % (ref 39.0–52.0)
Hemoglobin: 14.6 g/dL (ref 13.0–17.0)
MCH: 30.8 pg (ref 26.0–34.0)
MCHC: 34.3 g/dL (ref 30.0–36.0)
MCV: 89.9 fL (ref 80.0–100.0)
Platelets: 168 10*3/uL (ref 150–400)
RBC: 4.74 MIL/uL (ref 4.22–5.81)
RDW: 13.6 % (ref 11.5–15.5)
WBC: 5.4 10*3/uL (ref 4.0–10.5)
nRBC: 0 % (ref 0.0–0.2)

## 2021-09-20 LAB — RESP PANEL BY RT-PCR (FLU A&B, COVID) ARPGX2
Influenza A by PCR: NEGATIVE
Influenza B by PCR: NEGATIVE
SARS Coronavirus 2 by RT PCR: NEGATIVE

## 2021-09-20 LAB — PROTIME-INR
INR: 1 (ref 0.8–1.2)
Prothrombin Time: 13.1 seconds (ref 11.4–15.2)

## 2021-09-20 LAB — ETHANOL: Alcohol, Ethyl (B): 231 mg/dL — ABNORMAL HIGH (ref <10)

## 2021-09-20 LAB — APTT: aPTT: 34 seconds (ref 24–36)

## 2021-09-20 MED ORDER — CHLORDIAZEPOXIDE HCL 25 MG PO CAPS
25.0000 mg | ORAL_CAPSULE | Freq: Once | ORAL | Status: AC
  Administered 2021-09-20: 20:00:00 25 mg via ORAL
  Filled 2021-09-20: qty 1

## 2021-09-20 MED ORDER — CHLORDIAZEPOXIDE HCL 25 MG PO CAPS: ORAL_CAPSULE | ORAL | 0 refills | Status: DC

## 2021-09-20 MED ORDER — BACITRACIN ZINC 500 UNIT/GM EX OINT: TOPICAL_OINTMENT | Freq: Two times a day (BID) | CUTANEOUS | Status: DC

## 2021-09-20 MED ORDER — TETANUS-DIPHTH-ACELL PERTUSSIS 5-2.5-18.5 LF-MCG/0.5 IM SUSY
0.5000 mL | PREFILLED_SYRINGE | Freq: Once | INTRAMUSCULAR | Status: AC
  Administered 2021-09-20: 19:00:00 0.5 mL via INTRAMUSCULAR
  Filled 2021-09-20: qty 0.5

## 2021-09-20 MED ORDER — LIDOCAINE-EPINEPHRINE (PF) 2 %-1:200000 IJ SOLN
20.0000 mL | Freq: Once | INTRAMUSCULAR | Status: AC
  Administered 2021-09-20: 20 mL
  Filled 2021-09-20: qty 20

## 2021-09-20 MED ORDER — ONDANSETRON HCL 4 MG/2ML IJ SOLN
4.0000 mg | Freq: Once | INTRAMUSCULAR | Status: AC
  Administered 2021-09-20: 20:00:00 4 mg via INTRAVENOUS
  Filled 2021-09-20: qty 2

## 2021-09-20 NOTE — ED Notes (Signed)
Pt requesting to stay in bed for another 20 minutes before attempting ambulation trial.

## 2021-09-20 NOTE — ED Notes (Signed)
Pt ambulated with unsteady gait. Pt relied a lot on the wall and hallway railings to remain steady of his feet. PA made aware.

## 2021-09-20 NOTE — ED Notes (Signed)
ED Provider at bedside. 

## 2021-09-20 NOTE — ED Provider Notes (Signed)
Care assumed from [facility] prosperi, please see his note for full details, but in brief Dylan Weaver is a 55 y.o. male Arrives after he fell last night striking his forehead.  He has a laceration to the forehead.  Also endorses drinking alcohol prior to arrival.  Patient drinks daily.  CT of the head and cervical spine without acute traumatic injury.  Labs significant for ethanol of 231, no leukocytosis, mild transaminitis, likely in setting of alcohol use  Plan: Ensure patient is ambulatory and tolerating p.o. prior to discharge.   BP 139/86 (BP Location: Left Arm)    Pulse 100    Temp 98.1 F (36.7 C) (Oral)    Resp 18    Ht 6' (1.829 m)    Wt 79.4 kg    SpO2 97%    BMI 23.73 kg/m    ED Course/Procedures   Labs Reviewed  ETHANOL - Abnormal; Notable for the following components:      Result Value   Alcohol, Ethyl (B) 231 (*)    All other components within normal limits  COMPREHENSIVE METABOLIC PANEL - Abnormal; Notable for the following components:   Glucose, Bld 108 (*)    BUN <5 (*)    Creatinine, Ser 0.58 (*)    Calcium 8.7 (*)    Total Protein 9.0 (*)    AST 91 (*)    ALT 49 (*)    Alkaline Phosphatase 213 (*)    All other components within normal limits  RESP PANEL BY RT-PCR (FLU A&B, COVID) ARPGX2  PROTIME-INR  APTT  CBC  DIFFERENTIAL  RAPID URINE DRUG SCREEN, HOSP PERFORMED  URINALYSIS, ROUTINE W REFLEX MICROSCOPIC   CT Head Wo Contrast  Result Date: 09/20/2021 CLINICAL DATA:  Fall yesterday with head injury and neck pain, initial encounter EXAM: CT HEAD WITHOUT CONTRAST CT CERVICAL SPINE WITHOUT CONTRAST TECHNIQUE: Multidetector CT imaging of the head and cervical spine was performed following the standard protocol without intravenous contrast. Multiplanar CT image reconstructions of the cervical spine were also generated. COMPARISON:  09/14/2020 FINDINGS: CT HEAD FINDINGS Brain: No evidence of acute infarction, hemorrhage, hydrocephalus, extra-axial  collection or mass lesion/mass effect. Mild atrophic changes are noted. Vascular: No hyperdense vessel or unexpected calcification. Skull: Normal. Negative for fracture or focal lesion. Sinuses/Orbits: No acute finding. Other: None. CT CERVICAL SPINE FINDINGS Alignment: Mild straightening of the normal cervical lordosis is noted which may be related to muscular spasm. Skull base and vertebrae: 7 cervical segments are well visualized. Multilevel osteophytic changes are seen. No significant anterolisthesis or retrolisthesis is noted. Mild facet hypertrophic changes are seen. No acute fracture or acute facet abnormality noted. Soft tissues and spinal canal: Surrounding soft tissue structures are within normal limits. Upper chest: Lung apices are within normal limits. Other: None IMPRESSION: CT of the head: Mild atrophic changes without acute intracranial abnormality noted. CT of the cervical spine: Mild degenerative change without acute abnormality. Electronically Signed   By: Alcide Clever M.D.   On: 09/20/2021 18:07   CT Cervical Spine Wo Contrast  Result Date: 09/20/2021 CLINICAL DATA:  Fall yesterday with head injury and neck pain, initial encounter EXAM: CT HEAD WITHOUT CONTRAST CT CERVICAL SPINE WITHOUT CONTRAST TECHNIQUE: Multidetector CT imaging of the head and cervical spine was performed following the standard protocol without intravenous contrast. Multiplanar CT image reconstructions of the cervical spine were also generated. COMPARISON:  09/14/2020 FINDINGS: CT HEAD FINDINGS Brain: No evidence of acute infarction, hemorrhage, hydrocephalus, extra-axial collection or mass lesion/mass  effect. Mild atrophic changes are noted. Vascular: No hyperdense vessel or unexpected calcification. Skull: Normal. Negative for fracture or focal lesion. Sinuses/Orbits: No acute finding. Other: None. CT CERVICAL SPINE FINDINGS Alignment: Mild straightening of the normal cervical lordosis is noted which may be related to  muscular spasm. Skull base and vertebrae: 7 cervical segments are well visualized. Multilevel osteophytic changes are seen. No significant anterolisthesis or retrolisthesis is noted. Mild facet hypertrophic changes are seen. No acute fracture or acute facet abnormality noted. Soft tissues and spinal canal: Surrounding soft tissue structures are within normal limits. Upper chest: Lung apices are within normal limits. Other: None IMPRESSION: CT of the head: Mild atrophic changes without acute intracranial abnormality noted. CT of the cervical spine: Mild degenerative change without acute abnormality. Electronically Signed   By: Alcide Clever M.D.   On: 09/20/2021 18:07    Procedures  MDM   Patient tolerating p.o., ambulatory, does report feeling a bit woozy but admits to drinking alcohol prior to arrival.  Patient reports he feels well and would like to go home, he just needs someone to call a cab for him.  Nursing staff arranged transport home for patient.  Provided instructions for wound care and suture removal.  Provided prescription for Librium taper.       Dartha Lodge, PA-C 09/21/21 0018    Sloan Leiter, DO 09/21/21 [phone]

## 2021-09-20 NOTE — ED Notes (Signed)
Patient transported to CT 

## 2021-09-20 NOTE — ED Provider Notes (Signed)
[facility] EMERGENCY DEPARTMENT Provider Note   CSN: [id number] Arrival date & time: 09/20/21  1656     History Chief Complaint  Patient presents with   Dylan Weaver is a 55 y.o. male with a past medical history significant for hepatic steatosis, alcohol abuse, tobacco abuse who presents via EMS from home after fall last night.  Patient was found with dried blood on the floor, dresser.  Patient does endorse alcohol abuse.  Patient reports that he has been drinking about 1/5 a day since his wife left him, and he lost his job (?).  Patient does not remember falling, does not recall whether the laceration on his head was sustained today or last night.  Laceration in between eyes at bridge of nose present with lots of dried blood.  Patient also endorses alcohol today prior to arrival, of several beers.  Patient denies any other pain, nausea, vomiting.  He is somewhat tremulous and reports his last drink was many hours ago.   Fall Pertinent negatives include no headaches.      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)    Cellulitis 08/26/2021   Tobacco abuse 08/26/2021   Infection of right foot with necrosis 08/26/2021   Foot infection 08/26/2021   Hyponatremia    Infected superficial injury of right upper arm    Decompensated hepatic cirrhosis (HCC) 08/25/2021   Protein-calorie malnutrition, severe 06/14/2021   Acute liver failure with hepatic coma (HCC) 06/04/2021   Acute renal failure (ARF) (HCC) 06/04/2021   SIRS (systemic inflammatory response syndrome) (HCC) 11/29/2020   Elevated LFTs 11/29/2020   Intertrigo 11/29/2020   Prolonged QT interval 11/29/2020    Malnutrition of moderate degree 09/16/2020   Alcohol withdrawal (HCC) 09/14/2020   Alcoholic hepatitis 09/14/2020   Thrombocytopenia (HCC) 09/14/2020   Normochromic normocytic anemia 09/14/2020   Alcohol use disorder, severe, dependence (HCC) 06/19/2020   Alcohol abuse with intoxication (HCC) 06/19/2020    Past Surgical History:  Procedure Laterality Date   BIOPSY  06/08/2021   Procedure: BIOPSY;  Surgeon: Imogene Burn, MD;  Location: [facility] ENDOSCOPY;  Service: Endoscopy;;   BIOPSY  06/10/2021   Procedure: BIOPSY;  Surgeon: Lynann Bologna, MD;  Location: [facility] ENDOSCOPY;  Service: Gastroenterology;;   COLONOSCOPY WITH PROPOFOL N/A 06/10/2021   Procedure: COLONOSCOPY WITH PROPOFOL;  Surgeon: Lynann Bologna, MD;  Location: [facility] ENDOSCOPY;  Service: Gastroenterology;  Laterality: N/A;   ESOPHAGOGASTRODUODENOSCOPY (EGD) WITH PROPOFOL N/A 06/08/2021   Procedure: ESOPHAGOGASTRODUODENOSCOPY (EGD) WITH PROPOFOL;  Surgeon: Imogene Burn, MD;  Location: [facility] ENDOSCOPY;  Service: Endoscopy;  Laterality: N/A;   NASAL SEPTUM SURGERY     WRIST SURGERY         Family History  Problem Relation Age of Onset   Hypertension Other     Social History   Tobacco Use   Smoking status: Every Day    Packs/day: 0.50    Types: Cigarettes   Smokeless tobacco: Never  Vaping Use   Vaping Use: Never used  Substance Use Topics   Alcohol use: Yes    Comment: 12 pack a day beer   Drug use: Never    Home  Medications Prior to Admission medications   Medication Sig Start Date End Date Taking? Authorizing Provider  collagenase (SANTYL) ointment Apply topically daily. To right heel daily per wound care instructions. 09/03/21   Zigmund Daniel., MD  famotidine (PEPCID) 40 MG tablet Take 1 tablet (40 mg total) by mouth 2 (two) times daily. Patient not taking: Reported on 08/25/2021 06/14/21 09/12/21  Marinda Elk, MD  folic acid (FOLVITE) 1 MG tablet Take 1 tablet (1 mg total) by mouth daily. 12/04/20   Barnetta Chapel, MD  gabapentin (NEURONTIN) 100 MG capsule Take 1 capsule (100 mg total) by mouth 3 (three) times daily. Patient not taking: Reported on 08/25/2021 06/14/21   Marinda Elk, MD  magnesium oxide (MAG-OX) 400 (240 Mg) MG tablet Take 2 tablets (800 mg total) by mouth 2 (two) times daily. Patient not taking: Reported on 08/25/2021 07/05/21   Cipriano Bunker, MD  midodrine (PROAMATINE) 10 MG tablet Take 1 tablet (10 mg total) by mouth 3 (three) times daily with meals. 09/02/21 10/02/21  Zigmund Daniel., MD  Multiple Vitamin (MULTIVITAMIN WITH MINERALS) TABS tablet Take 1 tablet by mouth daily before breakfast. 12/04/20   Barnetta Chapel, MD  vitamin B-12 1000 MCG tablet Take 1 tablet (1,000 mcg total) by mouth daily. Patient not taking: Reported on 08/25/2021 07/06/21   Cipriano Bunker, MD  Zinc Oxide (TRIPLE PASTE) 12.8 % ointment Apply topically 4 times daily. Apply to buttocks, groin areas bilaterally, penis, sacrum, coccyx, upper thighs after washing with soap and water and patting dry 09/02/21   Zigmund Daniel., MD    Allergies    Patient has no known allergies.  Review of Systems   Review of Systems  Skin:  Positive for wound.  Neurological:  Negative for numbness and headaches.  Psychiatric/Behavioral:  Negative for confusion. The patient is not nervous/anxious.   All other systems reviewed and are negative.  Physical Exam Updated Vital Signs BP 124/85    Pulse (!) 107    Temp 98.1 F (36.7 C) (Oral)    Resp 16    Ht 6' (1.829 m)    Wt 79.4 kg    SpO2 99%    BMI 23.73 kg/m   Physical Exam Vitals and nursing note reviewed.  Constitutional:      General: He is not in acute distress.    Appearance: Normal appearance.  HENT:     Head: Normocephalic and atraumatic.  Eyes:     General:        Right eye: No discharge.        Left eye: No discharge.     Comments: Patient with anisocoria, left eye around 42mm, right eye 1mm, neither are particularly reactive  to light. Mild reaction. Intact EOMs.  Cardiovascular:     Rate and Rhythm: Normal rate and regular rhythm.     Heart sounds: No murmur heard.   No friction rub. No gallop.  Pulmonary:     Effort: Pulmonary effort is normal.     Breath sounds: Normal breath sounds.  Abdominal:     General: Bowel sounds are normal.     Palpations: Abdomen is soft.  Skin:    General: Skin is warm and dry.     Capillary Refill: Capillary refill takes less than 2 seconds.  Neurological:     Mental Status: He is alert and oriented to person, place, and time.     Comments: CN III through XII grossly intact.  Intact strength 5 out of 5 bilateral upper and lower extremities.  Patient is alert and oriented x3.  He is clinically intoxicated.  Intact finger-to-nose.  Psychiatric:        Mood and Affect: Mood normal.        Behavior: Behavior normal.    ED Results / Procedures / Treatments   Labs (all labs ordered are listed, but only abnormal results are displayed) Labs Reviewed  COMPREHENSIVE METABOLIC PANEL - Abnormal; Notable for the following components:      Result Value   Glucose, Bld 108 (*)    BUN <5 (*)    Creatinine, Ser 0.58 (*)    Calcium 8.7 (*)    Total Protein 9.0 (*)    AST 91 (*)    ALT 49 (*)    Alkaline Phosphatase 213 (*)    All other components within normal limits  RESP PANEL BY RT-PCR (FLU A&B, COVID) ARPGX2  PROTIME-INR  APTT  CBC  DIFFERENTIAL  ETHANOL  RAPID URINE DRUG SCREEN, HOSP PERFORMED  URINALYSIS, ROUTINE W REFLEX MICROSCOPIC    EKG None  Radiology CT Head Wo Contrast  Result Date: 09/20/2021 CLINICAL DATA:  Fall yesterday with head injury and neck pain, initial encounter EXAM: CT HEAD WITHOUT CONTRAST CT CERVICAL SPINE WITHOUT CONTRAST TECHNIQUE: Multidetector CT imaging of the head and cervical spine was performed following the standard protocol without intravenous contrast. Multiplanar CT image reconstructions of the cervical spine were also generated.  COMPARISON:  09/14/2020 FINDINGS: CT HEAD FINDINGS Brain: No evidence of acute infarction, hemorrhage, hydrocephalus, extra-axial collection or mass lesion/mass effect. Mild atrophic changes are noted. Vascular: No hyperdense vessel or unexpected calcification. Skull: Normal. Negative for fracture or focal lesion. Sinuses/Orbits: No acute finding. Other: None. CT CERVICAL SPINE FINDINGS Alignment: Mild straightening of the normal cervical lordosis is noted which may be related to muscular spasm. Skull base and vertebrae: 7 cervical segments are well visualized. Multilevel osteophytic changes are seen. No significant anterolisthesis or retrolisthesis is noted. Mild facet hypertrophic changes are seen. No acute fracture or acute facet abnormality noted. Soft tissues and spinal canal: Surrounding soft tissue structures are within normal limits. Upper chest: Lung apices are within normal limits. Other: None IMPRESSION: CT of the head: Mild atrophic changes without acute intracranial abnormality noted. CT of the cervical spine: Mild degenerative change without acute abnormality. Electronically Signed   By: Alcide Clever M.D.   On: 09/20/2021 18:07   CT Cervical Spine Wo Contrast  Result Date: 09/20/2021 CLINICAL DATA:  Fall yesterday with head injury and neck pain, initial encounter EXAM: CT HEAD WITHOUT CONTRAST CT CERVICAL SPINE WITHOUT CONTRAST TECHNIQUE: Multidetector CT imaging of the head and cervical spine was performed following the standard protocol without intravenous contrast. Multiplanar CT image reconstructions of the cervical spine were also generated. COMPARISON:  09/14/2020 FINDINGS: CT HEAD FINDINGS Brain: No evidence of acute infarction, hemorrhage, hydrocephalus, extra-axial collection or mass lesion/mass effect. Mild atrophic changes are noted. Vascular: No hyperdense vessel or unexpected calcification. Skull: Normal. Negative for fracture or focal lesion. Sinuses/Orbits: No acute finding. Other:  None. CT CERVICAL SPINE FINDINGS Alignment: Mild straightening of the normal cervical lordosis is noted which may be related to muscular spasm. Skull base and vertebrae: 7 cervical segments are well visualized. Multilevel osteophytic changes are seen. No significant anterolisthesis or retrolisthesis is noted. Mild facet hypertrophic changes are seen. No acute fracture or acute facet abnormality noted. Soft tissues and spinal canal: Surrounding soft tissue structures are within normal  limits. Upper chest: Lung apices are within normal limits. Other: None IMPRESSION: CT of the head: Mild atrophic changes without acute intracranial abnormality noted. CT of the cervical spine: Mild degenerative change without acute abnormality. Electronically Signed   By: Alcide Clever M.D.   On: 09/20/2021 18:07    Procedures .Marland KitchenLaceration Repair  Date/Time: 09/20/2021 7:44 PM Performed by: Olene Floss, PA-C Authorized by: Olene Floss, PA-C   Consent:    Consent obtained:  Verbal   Consent given by:  Patient   Risks, benefits, and alternatives were discussed: yes     Risks discussed:  Infection, pain, poor cosmetic result and poor wound healing   Alternatives discussed:  No treatment Universal protocol:    Procedure explained and questions answered to patient or proxy's satisfaction: yes     Patient identity confirmed:  Verbally with patient Laceration details:    Location:  Face   Face location:  Nose   Length (cm):  4.8   Depth (mm):  20 Exploration:    Limited defect created (wound extended): yes   Treatment:    Area cleansed with:  Povidone-iodine and saline   Amount of cleaning:  Standard   Irrigation solution:  Sterile saline Skin repair:    Repair method:  Sutures   Suture size:  5-0   Suture material:  Prolene   Suture technique:  Simple interrupted   Number of sutures:  8 Approximation:    Approximation:  Close Repair type:    Repair type:  Simple Post-procedure details:     Dressing:  Antibiotic ointment and non-adherent dressing   Procedure completion:  Tolerated   Medications Ordered in ED Medications  chlordiazePOXIDE (LIBRIUM) capsule 25 mg (has no administration in time range)  lidocaine-EPINEPHrine (XYLOCAINE W/EPI) 2 %-1:200000 (PF) injection 20 mL (20 mLs Infiltration Given by Other 09/20/21 1858)  Tdap (BOOSTRIX) injection 0.5 mL (0.5 mLs Intramuscular Given 09/20/21 1839)    ED Course  I have reviewed the triage vital signs and the nursing notes.  Pertinent labs & imaging results that were available during my care of the patient were reviewed by me and considered in my medical decision making (see chart for details).    MDM Rules/Calculators/A&P                         I discussed this case with my attending physician who cosigned this note including patient's presenting symptoms, physical exam, and planned diagnostics and interventions. Attending physician stated agreement with plan or made changes to plan which were implemented.   Clinically intoxicated male presents with fall last night, unknown loss of consciousness, unknown downtime.  Patient is too clinically intoxicated to answer these questions adequately.  Patient with large laceration on the bridge of his nose between his eyes.  On initial evaluation patient does have some obvious anisocoria as well as some horizontal nystagmus.  Anisocoria, and somewhat nonreactive pupils we will obtain stat head imaging and labwork as though the patient has an intracranial injury.  Stat head CT, neck CT without any abnormality, other than generalized degenerative changes, atrophy. Lab work is for the most part unremarkable other than a slight elevation of his transaminases, and alkaline phosphatase which is consistent with his alcohol abuse.  Laceration repaired as described above.  As patient was clinically intoxicated on initial presentation we will walk him at this time.  Patient is able to walk only  with significant assistance from the wall.  Patient also now actively vomiting.  Patient's ethanol level still pending.  As patient is not able to ambulate without assistance of wall, and actively vomiting I do believe that he needs to be monitored for a short amount of time until he is clinically sober, as well as time currently worried about alcohol withdrawal as patient is tremulous at baseline.  7:57 PM Care of Dylan Weaver transferred to [facility] and Dr. Wallace Cullens at the end of my shift as the patient will require reassessment once labs/imaging have resulted. Patient presentation, ED course, and plan of care discussed with review of all pertinent labs and imaging. Please see his/her note for further details regarding further ED course and disposition. Plan at time of handoff is monitor patient for approximately 1 hour to make sure that he is either not in florid withdrawal, or clinically sober enough to be discharged with Librium taper to home.  Sutures will need to be removed in 7 to 10 days. This may be altered or completely changed at the discretion of the oncoming team pending results of further workup.  Final Clinical Impression(s) / ED Diagnoses Final diagnoses:  None    Rx / DC Orders ED Discharge Orders     None        Olene Floss, PA-C 09/20/21 2001    Sloan Leiter, DO 09/21/21 [phone]

## 2021-09-20 NOTE — Discharge Instructions (Addendum)
As we discussed you will need to get your sutures removed in 7 to 10 days.  Additionally I think it is crucial that you seek treatment for your alcohol abuse.  I have attached some resources that you can reach out to you for detox.  We will send you home with a Librium taper, it is crucial that you stop drinking if you plan to take this medication as it is only for withdrawal symptoms and to prevent seizures.  Please follow-up with your primary care doctor to discuss your health at your earliest convenience.

## 2021-09-20 NOTE — ED Triage Notes (Signed)
Pt arrives via [facility] from home after fall last night with dried blood on floor, dresser. Pt does endorse ETOH. And fall last night with ETOH today. Denies LOC. 1/5 bourbon last night and some beers today PTA of EMS.

## 2021-09-20 NOTE — ED Notes (Signed)
Pt has deep laceration in between eyes, states he is unsure exactly when it happened, thinks sometime last night per his roommate. Pt also has wounds to right wrist, and right foot.

## 2021-09-20 NOTE — ED Notes (Signed)
ED provider at bedside for laceration repair

## 2021-09-27 ENCOUNTER — Emergency Department (HOSPITAL_COMMUNITY)
Admission: EM | Admit: 2021-09-27 | Discharge: 2021-09-27 | Disposition: A | Discharge status: Home / Self Care | Payer: No Typology Code available for payment source | Attending: Emergency Medicine | Admitting: Emergency Medicine

## 2021-09-27 ENCOUNTER — Encounter (HOSPITAL_COMMUNITY): Payer: Self-pay | Admitting: Emergency Medicine

## 2021-09-27 DIAGNOSIS — F10129 Alcohol abuse with intoxication, unspecified: Secondary | ICD-10-CM | POA: Diagnosis not present

## 2021-09-27 DIAGNOSIS — F17210 Nicotine dependence, cigarettes, uncomplicated: Secondary | ICD-10-CM | POA: Insufficient documentation

## 2021-09-27 DIAGNOSIS — F1099 Alcohol use, unspecified with unspecified alcohol-induced disorder: Secondary | ICD-10-CM

## 2021-09-27 MED ORDER — CHLORDIAZEPOXIDE HCL 25 MG PO CAPS: ORAL_CAPSULE | ORAL | 0 refills | Status: DC

## 2021-09-27 NOTE — Discharge Instructions (Addendum)
It was our pleasure to provide your ER care today - we hope that you feel better.  Avoid alcohol use. Follow up with AA, and see resource guide provided for additional alcohol use treatment programs in the area.   You may take librium as need, as prescribed, to help with alcohol withdrawal symptoms if present.   Also follow up  closely with primary care doctor in the next couple weeks.   Get sutures removed as previously instructed.   For mental health issues and/or crisis, you may go directly to the [facility] - it is open 24/7 and walk-ins are welcome.   Return to ER if worse, new symptoms, new/severe pain, persistent vomiting, trouble breathing, or other concern.

## 2021-09-27 NOTE — ED Notes (Signed)
Needed assistance to restroom with a wheelchair. When given bathroom callbell pt instead tried to ambulate by himself, needed the assistance of three people to hold him steady.

## 2021-09-27 NOTE — ED Triage Notes (Signed)
Per EMS-states patient wants detox from ETOH-drank a 1/5 of bourbon today

## 2021-09-27 NOTE — ED Provider Notes (Signed)
[facility] DEPT Provider Note   CSN: [id number] Arrival date & time: 09/27/21  1436     History Chief Complaint  Patient presents with   Alcohol Intoxication    Dylan Weaver is a 55 y.o. male.  Patient with hx etoh abuse, presents with etoh abuse/intoxication. Symptoms acute onset, episodic, recurrent, moderate-severe. Denies hx DTs, seizures, or complicated etoh withdrawal.  Denies other acute health issues. No headache. No cp or sob. No abd pain or nv. Denies trauma/fall. No fever or chills.   The history is provided by the patient and medical records.  Alcohol Intoxication Pertinent negatives include no chest pain, no abdominal pain, no headaches and no shortness of breath.      Past Medical History:  Diagnosis Date   ETOH abuse     Patient Active Problem List   Diagnosis Date Noted   Hypomagnesemia 08/31/2021   Hyperkalemia 08/31/2021   Elevated CK 08/31/2021   Hypotension 08/31/2021   Hepatic steatosis 08/31/2021   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 breakdown of skin ([place])    Cellulitis 08/26/2021   Tobacco abuse 08/26/2021   Infection of right foot with necrosis 08/26/2021   Foot infection 08/26/2021   Hyponatremia    Infected superficial injury of right upper arm    Decompensated hepatic cirrhosis ([place]) 08/25/2021   Protein-calorie malnutrition, severe 06/14/2021   Acute liver failure with hepatic coma ([place]) 06/04/2021   Acute renal failure (ARF) ([place]) 06/04/2021   SIRS (systemic inflammatory response syndrome) ([place]) 11/29/2020   Elevated LFTs 11/29/2020   Intertrigo 11/29/2020   Prolonged QT interval 11/29/2020   Malnutrition of moderate degree 09/16/2020   Alcohol withdrawal ([place]) 123456   Alcoholic hepatitis 123456   Thrombocytopenia ([place]) 09/14/2020   Normochromic normocytic anemia 09/14/2020   Alcohol use disorder, severe, dependence ([place]) 06/19/2020   Alcohol abuse  with intoxication ([place]) 06/19/2020    Past Surgical History:  Procedure Laterality Date   BIOPSY  06/08/2021   Procedure: BIOPSY;  Surgeon: Sharyn Creamer, MD;  Location: [facility] ENDOSCOPY;  Service: Endoscopy;;   BIOPSY  06/10/2021   Procedure: BIOPSY;  Surgeon: Jackquline Denmark, MD;  Location: [facility] ENDOSCOPY;  Service: Gastroenterology;;   COLONOSCOPY WITH PROPOFOL N/A 06/10/2021   Procedure: COLONOSCOPY WITH PROPOFOL;  Surgeon: Jackquline Denmark, MD;  Location: [facility];  Service: Gastroenterology;  Laterality: N/A;   ESOPHAGOGASTRODUODENOSCOPY (EGD) WITH PROPOFOL N/A 06/08/2021   Procedure: ESOPHAGOGASTRODUODENOSCOPY (EGD) WITH PROPOFOL;  Surgeon: Sharyn Creamer, MD;  Location: [facility] ENDOSCOPY;  Service: Endoscopy;  Laterality: N/A;   NASAL SEPTUM SURGERY     WRIST SURGERY         Family History  Problem Relation Age of Onset   Hypertension Other     Social History   Tobacco Use   Smoking status: Every Day    Packs/day: 0.50    Types: Cigarettes   Smokeless tobacco: Never  Vaping Use   Vaping Use: Never used  Substance Use Topics   Alcohol use: Yes    Comment: 12 pack a day beer   Drug use: Never    Home Medications Prior to Admission medications   Medication Sig Start Date End Date Taking? Authorizing Provider  chlordiazePOXIDE (LIBRIUM) 25 MG capsule 50mg  PO TID x 1D, then 25-50mg  PO BID X 1D, then 25-50mg  PO QD X 1D 09/20/21   Wynona Dove A, DO  collagenase (SANTYL) ointment Apply topically daily. To right heel daily per wound care instructions. 09/03/21  Zigmund Daniel., MD  famotidine (PEPCID) 40 MG tablet Take 1 tablet (40 mg total) by mouth 2 (two) times daily. Patient not taking: Reported on 08/25/2021 06/14/21 09/12/21  Marinda Elk, MD  folic acid (FOLVITE) 1 MG tablet Take 1 tablet (1 mg total) by mouth daily. 12/04/20   Barnetta Chapel, MD  gabapentin (NEURONTIN) 100 MG capsule Take 1 capsule (100 mg total) by mouth 3 (three) times daily. Patient not  taking: Reported on 08/25/2021 06/14/21   Marinda Elk, MD  magnesium oxide (MAG-OX) 400 (240 Mg) MG tablet Take 2 tablets (800 mg total) by mouth 2 (two) times daily. Patient not taking: Reported on 08/25/2021 07/05/21   Cipriano Bunker, MD  midodrine (PROAMATINE) 10 MG tablet Take 1 tablet (10 mg total) by mouth 3 (three) times daily with meals. 09/02/21 10/02/21  Zigmund Daniel., MD  Multiple Vitamin (MULTIVITAMIN WITH MINERALS) TABS tablet Take 1 tablet by mouth daily before breakfast. 12/04/20   Barnetta Chapel, MD  vitamin B-12 1000 MCG tablet Take 1 tablet (1,000 mcg total) by mouth daily. Patient not taking: Reported on 08/25/2021 07/06/21   Cipriano Bunker, MD  Zinc Oxide (TRIPLE PASTE) 12.8 % ointment Apply topically 4 times daily. Apply to buttocks, groin areas bilaterally, penis, sacrum, coccyx, upper thighs after washing with soap and water and patting dry 09/02/21   Zigmund Daniel., MD    Allergies    Patient has no known allergies.  Review of Systems   Review of Systems  Constitutional:  Negative for fever.  HENT:  Negative for sore throat.   Eyes:  Negative for visual disturbance.  Respiratory:  Negative for cough and shortness of breath.   Cardiovascular:  Negative for chest pain.  Gastrointestinal:  Negative for abdominal pain and vomiting.  Genitourinary:  Negative for flank pain.  Musculoskeletal:  Negative for back pain and neck pain.  Skin:  Negative for rash.  Neurological:  Negative for headaches.  Hematological:  Does not bruise/bleed easily.  Psychiatric/Behavioral:  Negative for confusion.    Physical Exam Updated Vital Signs BP (!) 146/90    Pulse (!) 105    Temp 97.8 F (36.6 C) (Oral)    Resp 16    SpO2 99%   Physical Exam Vitals and nursing note reviewed.  Constitutional:      Appearance: Normal appearance. He is well-developed.  HENT:     Head:     Comments: Healing sutured facial lac without sign of infection.     Nose: Nose  normal.     Mouth/Throat:     Mouth: Mucous membranes are moist.     Pharynx: Oropharynx is clear.  Eyes:     General: No scleral icterus.    Conjunctiva/sclera: Conjunctivae normal.     Pupils: Pupils are equal, round, and reactive to light.  Neck:     Trachea: No tracheal deviation.  Cardiovascular:     Rate and Rhythm: Normal rate and regular rhythm.     Pulses: Normal pulses.     Heart sounds: Normal heart sounds. No murmur heard.   No friction rub. No gallop.  Pulmonary:     Effort: Pulmonary effort is normal. No accessory muscle usage or respiratory distress.     Breath sounds: Normal breath sounds.  Abdominal:     General: Bowel sounds are normal. There is no distension.     Palpations: Abdomen is soft.     Tenderness: There is no abdominal  tenderness.  Genitourinary:    Comments: No cva tenderness. Musculoskeletal:        General: No swelling or tenderness.     Cervical back: Normal range of motion and neck supple. No rigidity.  Skin:    General: Skin is warm and dry.     Findings: No rash.  Neurological:     Mental Status: He is alert.     Comments: Alert, speech clear. Motor/sens grossly intact bil. No tremor or shakes.   Psychiatric:        Mood and Affect: Mood normal.    ED Results / Procedures / Treatments   Labs (all labs ordered are listed, but only abnormal results are displayed) Labs Reviewed - No data to display  EKG None  Radiology No results found.  Procedures Procedures   Medications Ordered in ED Medications - No data to display  ED Course  I have reviewed the triage vital signs and the nursing notes.  Pertinent labs & imaging results that were available during my care of the patient were reviewed by me and considered in my medical decision making (see chart for details).    MDM Rules/Calculators/A&P                         Reviewed nursing notes and prior charts for additional history. Recent visits for similar symptoms noted.    Will provide resource guide and encouraged to pursue outpatient treatment programs.   Po fluids/food.   Pt tolerating po, ambulates w steady gait, no tremor or shakes, alert, oriented.   Pt currently appears stable for d/c.   Return precautions provided.     Final Clinical Impression(s) / ED Diagnoses Final diagnoses:  None    Rx / DC Orders ED Discharge Orders     None        Lajean Saver, MD 09/27/21 1724

## 2021-10-22 ENCOUNTER — Encounter (HOSPITAL_COMMUNITY): Payer: Self-pay | Admitting: Emergency Medicine

## 2021-10-22 ENCOUNTER — Inpatient Hospital Stay (HOSPITAL_COMMUNITY)
Admission: EM | Admit: 2021-10-22 | Discharge: 2021-11-04 | DRG: 871 | Disposition: A | Discharge status: Home Health | Payer: No Typology Code available for payment source | Attending: Internal Medicine | Admitting: Internal Medicine

## 2021-10-22 ENCOUNTER — Emergency Department (HOSPITAL_COMMUNITY): Payer: No Typology Code available for payment source

## 2021-10-22 DIAGNOSIS — Z6823 Body mass index (BMI) 23.0-23.9, adult: Secondary | ICD-10-CM

## 2021-10-22 DIAGNOSIS — F10939 Alcohol use, unspecified with withdrawal, unspecified: Secondary | ICD-10-CM | POA: Diagnosis not present

## 2021-10-22 DIAGNOSIS — E43 Unspecified severe protein-calorie malnutrition: Secondary | ICD-10-CM | POA: Diagnosis present

## 2021-10-22 DIAGNOSIS — L304 Erythema intertrigo: Secondary | ICD-10-CM | POA: Diagnosis present

## 2021-10-22 DIAGNOSIS — B372 Candidiasis of skin and nail: Secondary | ICD-10-CM | POA: Diagnosis present

## 2021-10-22 DIAGNOSIS — R7989 Other specified abnormal findings of blood chemistry: Secondary | ICD-10-CM | POA: Diagnosis present

## 2021-10-22 DIAGNOSIS — R7881 Bacteremia: Secondary | ICD-10-CM | POA: Diagnosis present

## 2021-10-22 DIAGNOSIS — E876 Hypokalemia: Secondary | ICD-10-CM | POA: Diagnosis present

## 2021-10-22 DIAGNOSIS — R32 Unspecified urinary incontinence: Secondary | ICD-10-CM | POA: Diagnosis present

## 2021-10-22 DIAGNOSIS — K7010 Alcoholic hepatitis without ascites: Secondary | ICD-10-CM | POA: Diagnosis present

## 2021-10-22 DIAGNOSIS — U071 COVID-19: Secondary | ICD-10-CM | POA: Diagnosis present

## 2021-10-22 DIAGNOSIS — F10239 Alcohol dependence with withdrawal, unspecified: Secondary | ICD-10-CM | POA: Diagnosis present

## 2021-10-22 DIAGNOSIS — L24A2 Irritant contact dermatitis due to fecal, urinary or dual incontinence: Secondary | ICD-10-CM | POA: Diagnosis present

## 2021-10-22 DIAGNOSIS — L89890 Pressure ulcer of other site, unstageable: Secondary | ICD-10-CM | POA: Diagnosis present

## 2021-10-22 DIAGNOSIS — B952 Enterococcus as the cause of diseases classified elsewhere: Secondary | ICD-10-CM | POA: Diagnosis not present

## 2021-10-22 DIAGNOSIS — Z8249 Family history of ischemic heart disease and other diseases of the circulatory system: Secondary | ICD-10-CM | POA: Diagnosis not present

## 2021-10-22 DIAGNOSIS — Z720 Tobacco use: Secondary | ICD-10-CM | POA: Diagnosis present

## 2021-10-22 DIAGNOSIS — L03115 Cellulitis of right lower limb: Secondary | ICD-10-CM | POA: Diagnosis present

## 2021-10-22 DIAGNOSIS — E860 Dehydration: Secondary | ICD-10-CM | POA: Diagnosis present

## 2021-10-22 DIAGNOSIS — L0390 Cellulitis, unspecified: Secondary | ICD-10-CM | POA: Diagnosis present

## 2021-10-22 DIAGNOSIS — F1020 Alcohol dependence, uncomplicated: Secondary | ICD-10-CM | POA: Diagnosis not present

## 2021-10-22 DIAGNOSIS — E871 Hypo-osmolality and hyponatremia: Secondary | ICD-10-CM | POA: Diagnosis present

## 2021-10-22 DIAGNOSIS — D6959 Other secondary thrombocytopenia: Secondary | ICD-10-CM | POA: Diagnosis present

## 2021-10-22 DIAGNOSIS — L8990 Pressure ulcer of unspecified site, unspecified stage: Secondary | ICD-10-CM | POA: Diagnosis present

## 2021-10-22 DIAGNOSIS — A4181 Sepsis due to Enterococcus: Principal | ICD-10-CM | POA: Diagnosis present

## 2021-10-22 DIAGNOSIS — K7030 Alcoholic cirrhosis of liver without ascites: Secondary | ICD-10-CM | POA: Diagnosis present

## 2021-10-22 DIAGNOSIS — E872 Acidosis, unspecified: Secondary | ICD-10-CM | POA: Diagnosis present

## 2021-10-22 DIAGNOSIS — A419 Sepsis, unspecified organism: Secondary | ICD-10-CM

## 2021-10-22 DIAGNOSIS — R159 Full incontinence of feces: Secondary | ICD-10-CM | POA: Diagnosis present

## 2021-10-22 DIAGNOSIS — M6282 Rhabdomyolysis: Secondary | ICD-10-CM | POA: Diagnosis present

## 2021-10-22 DIAGNOSIS — Z91199 Patient's noncompliance with other medical treatment and regimen due to unspecified reason: Secondary | ICD-10-CM

## 2021-10-22 DIAGNOSIS — R5381 Other malaise: Secondary | ICD-10-CM | POA: Diagnosis present

## 2021-10-22 DIAGNOSIS — L03119 Cellulitis of unspecified part of limb: Secondary | ICD-10-CM

## 2021-10-22 DIAGNOSIS — L89892 Pressure ulcer of other site, stage 2: Secondary | ICD-10-CM | POA: Diagnosis present

## 2021-10-22 DIAGNOSIS — G9341 Metabolic encephalopathy: Secondary | ICD-10-CM | POA: Diagnosis present

## 2021-10-22 DIAGNOSIS — F17210 Nicotine dependence, cigarettes, uncomplicated: Secondary | ICD-10-CM | POA: Diagnosis present

## 2021-10-22 DIAGNOSIS — R6520 Severe sepsis without septic shock: Secondary | ICD-10-CM | POA: Diagnosis present

## 2021-10-22 DIAGNOSIS — F10229 Alcohol dependence with intoxication, unspecified: Secondary | ICD-10-CM | POA: Diagnosis present

## 2021-10-22 DIAGNOSIS — L03116 Cellulitis of left lower limb: Secondary | ICD-10-CM | POA: Diagnosis present

## 2021-10-22 LAB — CBC WITH DIFFERENTIAL/PLATELET
Abs Immature Granulocytes: 0.12 10*3/uL — ABNORMAL HIGH (ref 0.00–0.07)
Basophils Absolute: 0.1 10*3/uL (ref 0.0–0.1)
Basophils Relative: 1 %
Eosinophils Absolute: 0 10*3/uL (ref 0.0–0.5)
Eosinophils Relative: 0 %
HCT: 51.9 % (ref 39.0–52.0)
Hemoglobin: 16.7 g/dL (ref 13.0–17.0)
Immature Granulocytes: 1 %
Lymphocytes Relative: 8 %
Lymphs Abs: 1.1 10*3/uL (ref 0.7–4.0)
MCH: 32 pg (ref 26.0–34.0)
MCHC: 32.2 g/dL (ref 30.0–36.0)
MCV: 99.4 fL (ref 80.0–100.0)
Monocytes Absolute: 1.3 10*3/uL — ABNORMAL HIGH (ref 0.1–1.0)
Monocytes Relative: 10 %
Neutro Abs: 10.7 10*3/uL — ABNORMAL HIGH (ref 1.7–7.7)
Neutrophils Relative %: 80 %
Platelets: 85 10*3/uL — ABNORMAL LOW (ref 150–400)
RBC: 5.22 MIL/uL (ref 4.22–5.81)
RDW: 18.2 % — ABNORMAL HIGH (ref 11.5–15.5)
WBC: 13.3 10*3/uL — ABNORMAL HIGH (ref 4.0–10.5)
nRBC: 0 % (ref 0.0–0.2)

## 2021-10-22 LAB — COMPREHENSIVE METABOLIC PANEL
ALT: 98 U/L — ABNORMAL HIGH (ref 0–44)
ALT: 67 U/L — ABNORMAL HIGH (ref 0–44)
AST: 189 U/L — ABNORMAL HIGH (ref 15–41)
AST: 120 U/L — ABNORMAL HIGH (ref 15–41)
Albumin: 2.9 g/dL — ABNORMAL LOW (ref 3.5–5.0)
Albumin: 2 g/dL — ABNORMAL LOW (ref 3.5–5.0)
Alkaline Phosphatase: 172 U/L — ABNORMAL HIGH (ref 38–126)
Alkaline Phosphatase: 123 U/L (ref 38–126)
Anion gap: 19 — ABNORMAL HIGH (ref 5–15)
Anion gap: 11 (ref 5–15)
BUN: 16 mg/dL (ref 6–20)
BUN: 14 mg/dL (ref 6–20)
CO2: 20 mmol/L — ABNORMAL LOW (ref 22–32)
CO2: 23 mmol/L (ref 22–32)
Calcium: 9.4 mg/dL (ref 8.9–10.3)
Calcium: 8.3 mg/dL — ABNORMAL LOW (ref 8.9–10.3)
Chloride: 98 mmol/L (ref 98–111)
Chloride: 100 mmol/L (ref 98–111)
Creatinine, Ser: 1.06 mg/dL (ref 0.61–1.24)
Creatinine, Ser: 0.74 mg/dL (ref 0.61–1.24)
GFR, Estimated: >60 mL/min (ref >60)
GFR, Estimated: >60 mL/min (ref >60)
Glucose, Bld: 112 mg/dL — ABNORMAL HIGH (ref 70–99)
Glucose, Bld: 112 mg/dL — ABNORMAL HIGH (ref 70–99)
Potassium: 3.9 mmol/L (ref 3.5–5.1)
Potassium: 3.1 mmol/L — ABNORMAL LOW (ref 3.5–5.1)
Sodium: 137 mmol/L (ref 135–145)
Sodium: 134 mmol/L — ABNORMAL LOW (ref 135–145)
Total Bilirubin: 1.9 mg/dL — ABNORMAL HIGH (ref 0.3–1.2)
Total Bilirubin: 1 mg/dL (ref 0.3–1.2)
Total Protein: 9.7 g/dL — ABNORMAL HIGH (ref 6.5–8.1)
Total Protein: 6.6 g/dL (ref 6.5–8.1)

## 2021-10-22 LAB — URINALYSIS, ROUTINE W REFLEX MICROSCOPIC
Bacteria, UA: NONE SEEN
Bilirubin Urine: NEGATIVE
Glucose, UA: NEGATIVE mg/dL
Hgb urine dipstick: NEGATIVE
Ketones, ur: 5 mg/dL — AB
Nitrite: NEGATIVE
Protein, ur: NEGATIVE mg/dL
Specific Gravity, Urine: 1.024 (ref 1.005–1.030)
pH: 5 (ref 5.0–8.0)

## 2021-10-22 LAB — CK
Total CK: 1490 U/L — ABNORMAL HIGH (ref 49–397)
Total CK: 934 U/L — ABNORMAL HIGH (ref 49–397)

## 2021-10-22 LAB — APTT: aPTT: 32 seconds (ref 24–36)

## 2021-10-22 LAB — RESP PANEL BY RT-PCR (FLU A&B, COVID) ARPGX2
Influenza A by PCR: NEGATIVE
Influenza B by PCR: NEGATIVE
SARS Coronavirus 2 by RT PCR: POSITIVE — AB

## 2021-10-22 LAB — LACTIC ACID, PLASMA
Lactic Acid, Venous: 4.4 mmol/L (ref 0.5–1.9)
Lactic Acid, Venous: 2.3 mmol/L (ref 0.5–1.9)

## 2021-10-22 LAB — HIV ANTIBODY (ROUTINE TESTING W REFLEX): HIV Screen 4th Generation wRfx: NONREACTIVE

## 2021-10-22 LAB — PROTIME-INR
INR: 1 (ref 0.8–1.2)
Prothrombin Time: 13.2 seconds (ref 11.4–15.2)

## 2021-10-22 MED ORDER — LORAZEPAM 2 MG/ML IJ SOLN
1.0000 mg | INTRAMUSCULAR | Status: AC | PRN
  Administered 2021-10-23 (×2): 2 mg via INTRAVENOUS
  Filled 2021-10-22 (×2): qty 1

## 2021-10-22 MED ORDER — FOLIC ACID 1 MG PO TABS
1.0000 mg | ORAL_TABLET | Freq: Every day | ORAL | Status: DC
  Administered 2021-10-22 – 2021-11-04 (×14): 1 mg via ORAL
  Filled 2021-10-22 (×14): qty 1

## 2021-10-22 MED ORDER — SODIUM CHLORIDE 0.9 % IV SOLN
2.0000 g | Freq: Once | INTRAVENOUS | Status: AC
  Administered 2021-10-22: 2 g via INTRAVENOUS
  Filled 2021-10-22: qty 20

## 2021-10-22 MED ORDER — VANCOMYCIN HCL 1500 MG/300ML IV SOLN
1500.0000 mg | Freq: Once | INTRAVENOUS | Status: AC
  Administered 2021-10-22: 1500 mg via INTRAVENOUS
  Filled 2021-10-22: qty 300

## 2021-10-22 MED ORDER — LACTATED RINGERS IV BOLUS
1000.0000 mL | Freq: Once | INTRAVENOUS | Status: AC
Start: 2021-10-22 — End: 2021-10-22
  Administered 2021-10-22: 1000 mL via INTRAVENOUS

## 2021-10-22 MED ORDER — MAGNESIUM SULFATE 2 GM/50ML IV SOLN
2.0000 g | Freq: Once | INTRAVENOUS | Status: AC
  Administered 2021-10-22: 2 g via INTRAVENOUS
  Filled 2021-10-22: qty 50

## 2021-10-22 MED ORDER — ADULT MULTIVITAMIN W/MINERALS CH
1.0000 | ORAL_TABLET | Freq: Every day | ORAL | Status: DC
  Administered 2021-10-22 – 2021-11-04 (×14): 1 via ORAL
  Filled 2021-10-22 (×14): qty 1

## 2021-10-22 MED ORDER — LORAZEPAM 1 MG PO TABS
1.0000 mg | ORAL_TABLET | ORAL | Status: AC | PRN
  Administered 2021-10-22: 1 mg via ORAL
  Administered 2021-10-22: 3 mg via ORAL
  Administered 2021-10-23: 2 mg via ORAL
  Administered 2021-10-23: 1 mg via ORAL
  Administered 2021-10-23: 2 mg via ORAL
  Administered 2021-10-24: 1 mg via ORAL
  Filled 2021-10-22: qty 3
  Filled 2021-10-22 (×3): qty 1
  Filled 2021-10-22 (×2): qty 2

## 2021-10-22 MED ORDER — LACTATED RINGERS IV BOLUS
2000.0000 mL | Freq: Once | INTRAVENOUS | Status: AC
  Administered 2021-10-22: 2000 mL via INTRAVENOUS

## 2021-10-22 MED ORDER — THIAMINE HCL 100 MG/ML IJ SOLN
100.0000 mg | Freq: Every day | INTRAMUSCULAR | Status: AC
  Administered 2021-10-22 – 2021-10-24 (×3): 100 mg via INTRAVENOUS
  Filled 2021-10-22 (×3): qty 2

## 2021-10-22 MED ORDER — VANCOMYCIN HCL 1750 MG/350ML IV SOLN
1750.0000 mg | INTRAVENOUS | Status: DC
  Administered 2021-10-23 – 2021-10-24 (×2): 1750 mg via INTRAVENOUS
  Filled 2021-10-22 (×2): qty 350

## 2021-10-22 MED ORDER — SODIUM CHLORIDE 0.9 % IV SOLN
1.0000 g | INTRAVENOUS | Status: DC
  Filled 2021-10-22: qty 10

## 2021-10-22 MED ORDER — SODIUM CHLORIDE 0.9 % IV SOLN
1.0000 g | INTRAVENOUS | Status: DC
  Administered 2021-10-22 – 2021-10-23 (×2): 1 g via INTRAVENOUS
  Filled 2021-10-22 (×2): qty 10

## 2021-10-22 MED ORDER — MAGNESIUM OXIDE -MG SUPPLEMENT 400 (240 MG) MG PO TABS
400.0000 mg | ORAL_TABLET | Freq: Two times a day (BID) | ORAL | Status: DC
  Administered 2021-10-22 – 2021-11-04 (×27): 400 mg via ORAL
  Filled 2021-10-22 (×27): qty 1

## 2021-10-22 MED ORDER — NICOTINE 21 MG/24HR TD PT24
21.0000 mg | MEDICATED_PATCH | Freq: Every day | TRANSDERMAL | Status: DC
  Administered 2021-10-22 – 2021-10-25 (×4): 21 mg via TRANSDERMAL
  Filled 2021-10-22 (×11): qty 1

## 2021-10-22 MED ORDER — NYSTATIN 100000 UNIT/GM EX CREA
TOPICAL_CREAM | Freq: Two times a day (BID) | CUTANEOUS | Status: DC
  Administered 2021-10-27 – 2021-10-31 (×2): 1 via TOPICAL
  Filled 2021-10-22 (×6): qty 15

## 2021-10-22 MED ORDER — HEPARIN SODIUM (PORCINE) 5000 UNIT/ML IJ SOLN
5000.0000 [IU] | Freq: Three times a day (TID) | INTRAMUSCULAR | Status: DC
  Administered 2021-10-22 – 2021-10-25 (×10): 5000 [IU] via SUBCUTANEOUS
  Filled 2021-10-22 (×10): qty 1

## 2021-10-22 MED ORDER — ZINC OXIDE 12.8 % EX OINT
1.0000 "application " | TOPICAL_OINTMENT | Freq: Two times a day (BID) | CUTANEOUS | Status: DC | PRN
  Administered 2021-10-28: 1 via TOPICAL
  Filled 2021-10-22: qty 56.7

## 2021-10-22 NOTE — Assessment & Plan Note (Addendum)
No signs of withdrawal-out of window for any withdrawal symptoms.  Managed with CIWA protocol.  Has been counseled extensively regarding importance of stopping further alcohol use.  He claims he already is aware of local AA programs.

## 2021-10-22 NOTE — Assessment & Plan Note (Signed)
Admit to observation telemetry bed. IV rocephin, IV Vancomycin.

## 2021-10-22 NOTE — H&P (Signed)
History and Physical    Braelin Amon [id number] DOB: Dec 17, 1965 DOA: 10/22/2021  PCP: [facility]   Patient coming from: Home  I have personally briefly reviewed patient's old medical records in [place]  CC: found down HPI: 56 year old male with a history of chronic alcohol abuse, alcoholic cirrhosis, malnutrition, presents to the ER today after being found down on the floor.  Patient states that he has been sleeping on the floor for the last 4 days on his back.  He has been unable to get off the floor.  He has been urinating and defecating on himself.  His housekeeper came over today and called EMS.  On arrival, temp 98.2, heart rate 111, blood pressure 124/94.  According to nurse, patient covered in feces and old urine.  When he was cleaned up, his perineum, thighs, lower abdomen were erythematous and partially denuded.  Initial labs showed a lactic acid of 4.4.  He was COVID-positive. Total CK of 1490 White count of 13.3  After IV fluids, lactate decreased to 2.3.  To the patient cellulitis, rhabdomyolysis and sepsis with acute organ dysfunction, [facility] hospitalist contacted for admission.   ED Course: noted to have lactic acidosis. 3 L LR ordered. Ct abd/pelvis negative for perineal abscess  Review of Systems:  Review of Systems  Constitutional: Negative.   HENT: Negative.    Eyes: Negative.   Respiratory: Negative.    Cardiovascular: Negative.   Gastrointestinal:  Positive for diarrhea.  Genitourinary:        Unable to urinate today  Musculoskeletal:  Positive for back pain.  Skin:  Positive for rash.  Neurological:        Unable to get up off the floor  Endo/Heme/Allergies: Negative.   Psychiatric/Behavioral:  Positive for substance abuse.   All other systems reviewed and are negative.  Past Medical History:  Diagnosis Date   Acute liver failure with hepatic coma ([place]) 06/04/2021   ETOH abuse     Past Surgical History:   Procedure Laterality Date   BIOPSY  06/08/2021   Procedure: BIOPSY;  Surgeon: Sharyn Creamer, MD;  Location: [facility] ENDOSCOPY;  Service: Endoscopy;;   BIOPSY  06/10/2021   Procedure: BIOPSY;  Surgeon: Jackquline Denmark, MD;  Location: [facility] ENDOSCOPY;  Service: Gastroenterology;;   COLONOSCOPY WITH PROPOFOL N/A 06/10/2021   Procedure: COLONOSCOPY WITH PROPOFOL;  Surgeon: Jackquline Denmark, MD;  Location: [facility];  Service: Gastroenterology;  Laterality: N/A;   ESOPHAGOGASTRODUODENOSCOPY (EGD) WITH PROPOFOL N/A 06/08/2021   Procedure: ESOPHAGOGASTRODUODENOSCOPY (EGD) WITH PROPOFOL;  Surgeon: Sharyn Creamer, MD;  Location: [facility] ENDOSCOPY;  Service: Endoscopy;  Laterality: N/A;   NASAL SEPTUM SURGERY     WRIST SURGERY       reports that he has been smoking cigarettes. He has been smoking an average of .5 packs per day. He has never used smokeless tobacco. He reports current alcohol use. He reports that he does not use drugs.  No Known Allergies  Family History  Problem Relation Age of Onset   Hypertension Other     Prior to Admission medications   Medication Sig Start Date End Date Taking? Authorizing Provider  collagenase (SANTYL) ointment Apply topically daily. To right heel daily per wound care instructions. 09/03/21  Yes Elodia Florence., MD  folic acid (FOLVITE) 1 MG tablet Take 1 tablet (1 mg total) by mouth daily. 12/04/20  Yes Dana Allan I, MD  magnesium oxide (MAG-OX) 400 (240 Mg) MG tablet Take 2 tablets (800  mg total) by mouth 2 (two) times daily. 07/05/21  Yes Shawna Clamp, MD  Multiple Vitamin (MULTIVITAMIN WITH MINERALS) TABS tablet Take 1 tablet by mouth daily before breakfast. 12/04/20  Yes Bonnell Public, MD  vitamin B-12 1000 MCG tablet Take 1 tablet (1,000 mcg total) by mouth daily. 07/06/21  Yes Shawna Clamp, MD  Zinc Oxide (TRIPLE PASTE) 12.8 % ointment Apply topically 4 times daily. Apply to buttocks, groin areas bilaterally, penis, sacrum, coccyx, upper thighs after  washing with soap and water and patting dry 09/02/21  Yes Elodia Florence., MD  chlordiazePOXIDE (LIBRIUM) 25 MG capsule 25 mg PO TID x 1 day, then 25 mg PO BID X 1 day, then 25 mg PO QD X 1 day Patient not taking: Reported on 10/22/2021 09/27/21   Lajean Saver, MD  famotidine (PEPCID) 40 MG tablet Take 1 tablet (40 mg total) by mouth 2 (two) times daily. Patient not taking: Reported on 08/25/2021 06/14/21 09/12/21  Charlynne Cousins, MD  gabapentin (NEURONTIN) 100 MG capsule Take 1 capsule (100 mg total) by mouth 3 (three) times daily. Patient not taking: Reported on 08/25/2021 06/14/21   Charlynne Cousins, MD    Physical Exam: Vitals:   10/22/21 0230 10/22/21 0330 10/22/21 0345 10/22/21 0430  BP: 122/88 124/82 107/71 104/67  Pulse: (!) 103 97 97 (!) 105  Resp: 20 (!) 22 (!) 22 (!) 23  Temp:      TempSrc:      SpO2: 100% 100% 100% 99%    Physical Exam Vitals and nursing note reviewed.  Constitutional:      General: He is not in acute distress.    Appearance: He is ill-appearing. He is not toxic-appearing.     Comments: Chronically ill-appearing  HENT:     Head:     Comments: Multiple bruises to his face Eyes:     General: No scleral icterus. Cardiovascular:     Rate and Rhythm: Regular rhythm. Tachycardia present.  Pulmonary:     Effort: Pulmonary effort is normal. No respiratory distress.     Breath sounds: No wheezing.  Abdominal:     General: Bowel sounds are normal.     Palpations: Abdomen is soft.     Tenderness: There is no abdominal tenderness.  Skin:    Comments: Multiple areas of denuded skin over his thighs.  He has bright red erythema over the entire lower abdomen and upper thighs including his penis scrotum and perineum see picture.  Neurological:     Mental Status: He is alert and oriented to person, place, and time.      Labs on Admission: I have personally reviewed following labs and imaging studies  CBC: Recent Labs  Lab 10/22/21 0103   WBC 13.3*  NEUTROABS 10.7*  HGB 16.7  HCT 51.9  MCV 99.4  PLT 85*   Basic Metabolic Panel: Recent Labs  Lab 10/22/21 0103  NA 137  K 3.9  CL 98  CO2 20*  GLUCOSE 112*  BUN 16  CREATININE 1.06  CALCIUM 9.4   GFR: CrCl cannot be calculated (Unknown ideal weight.). Liver Function Tests: Recent Labs  Lab 10/22/21 0103  AST 189*  ALT 98*  ALKPHOS 172*  BILITOT 1.9*  PROT 9.7*  ALBUMIN 2.9*   No results for input(s): LIPASE, AMYLASE in the last 168 hours. No results for input(s): AMMONIA in the last 168 hours. Coagulation Profile: Recent Labs  Lab 10/22/21 0334  INR 1.0   Cardiac Enzymes: Recent  Labs  Lab 10/22/21 0103  CKTOTAL 1,490*   BNP (last 3 results) No results for input(s): PROBNP in the last 8760 hours. HbA1C: No results for input(s): HGBA1C in the last 72 hours. CBG: No results for input(s): GLUCAP in the last 168 hours. Lipid Profile: No results for input(s): CHOL, HDL, LDLCALC, TRIG, CHOLHDL, LDLDIRECT in the last 72 hours. Thyroid Function Tests: No results for input(s): TSH, T4TOTAL, FREET4, T3FREE, THYROIDAB in the last 72 hours. Anemia Panel: No results for input(s): VITAMINB12, FOLATE, FERRITIN, TIBC, IRON, RETICCTPCT in the last 72 hours. Urine analysis:    Component Value Date/Time   COLORURINE YELLOW 08/25/2021 [place] 08/25/2021 0619   LABSPEC 1.008 08/25/2021 0619   PHURINE 7.0 08/25/2021 0619   GLUCOSEU NEGATIVE 08/25/2021 0619   HGBUR NEGATIVE 08/25/2021 [place] 08/25/2021 0619   [place] 08/25/2021 0619   PROTEINUR NEGATIVE 08/25/2021 0619   NITRITE NEGATIVE 08/25/2021 0619   LEUKOCYTESUR TRACE (A) 08/25/2021 0619    Radiological Exams on Admission: I have personally reviewed images CT HEAD WO CONTRAST (5MM)  Result Date: 10/22/2021 CLINICAL DATA:  Found down, head and neck trauma. EXAM: CT HEAD WITHOUT CONTRAST CT CERVICAL SPINE WITHOUT CONTRAST TECHNIQUE: Multidetector  CT imaging of the head and cervical spine was performed following the standard protocol without intravenous contrast. Multiplanar CT image reconstructions of the cervical spine were also generated. RADIATION DOSE REDUCTION: This exam was performed according to the departmental dose-optimization program which includes automated exposure control, adjustment of the mA and/or kV according to patient size and/or use of iterative reconstruction technique. COMPARISON:  09/20/2021. FINDINGS: CT HEAD FINDINGS Brain: No acute intracranial hemorrhage, midline shift or mass effect. No extra-axial fluid collection. Mild atrophy is noted. Periventricular white matter hypodensities are noted bilaterally. No hydrocephalus. Vascular: No hyperdense vessel or unexpected calcification. Skull: Normal. Negative for fracture or focal lesion. Sinuses/Orbits: No acute finding. Other: None. CT CERVICAL SPINE FINDINGS Alignment: Mild retrolisthesis at C3-C4. Skull base and vertebrae: No acute fracture. No primary bone lesion or focal pathologic process. Soft tissues and spinal canal: No prevertebral fluid or swelling. No visible canal hematoma. Disc levels: Multilevel intervertebral disc space narrowing, disc herniation, osteophyte formation, and facet arthropathy resulting in mild to moderate stenosis, not significantly changed from the prior exam. Upper chest: Apical pleural blebs are present bilaterally. Other: None. IMPRESSION: 1. No acute intracranial process. 2. Atrophy with chronic microvascular ischemic changes. 3. Degenerative changes in the cervical spine without evidence of acute fracture. Electronically Signed   By: Brett Fairy M.D.   On: 10/22/2021 01:40   CT CERVICAL SPINE WO CONTRAST  Result Date: 10/22/2021 CLINICAL DATA:  Found down, head and neck trauma. EXAM: CT HEAD WITHOUT CONTRAST CT CERVICAL SPINE WITHOUT CONTRAST TECHNIQUE: Multidetector CT imaging of the head and cervical spine was performed following the  standard protocol without intravenous contrast. Multiplanar CT image reconstructions of the cervical spine were also generated. RADIATION DOSE REDUCTION: This exam was performed according to the departmental dose-optimization program which includes automated exposure control, adjustment of the mA and/or kV according to patient size and/or use of iterative reconstruction technique. COMPARISON:  09/20/2021. FINDINGS: CT HEAD FINDINGS Brain: No acute intracranial hemorrhage, midline shift or mass effect. No extra-axial fluid collection. Mild atrophy is noted. Periventricular white matter hypodensities are noted bilaterally. No hydrocephalus. Vascular: No hyperdense vessel or unexpected calcification. Skull: Normal. Negative for fracture or focal lesion. Sinuses/Orbits: No acute finding. Other: None. CT CERVICAL SPINE FINDINGS  Alignment: Mild retrolisthesis at C3-C4. Skull base and vertebrae: No acute fracture. No primary bone lesion or focal pathologic process. Soft tissues and spinal canal: No prevertebral fluid or swelling. No visible canal hematoma. Disc levels: Multilevel intervertebral disc space narrowing, disc herniation, osteophyte formation, and facet arthropathy resulting in mild to moderate stenosis, not significantly changed from the prior exam. Upper chest: Apical pleural blebs are present bilaterally. Other: None. IMPRESSION: 1. No acute intracranial process. 2. Atrophy with chronic microvascular ischemic changes. 3. Degenerative changes in the cervical spine without evidence of acute fracture. Electronically Signed   By: Thornell Sartorius M.D.   On: 10/22/2021 01:40   DG Chest Port 1 View  Result Date: 10/22/2021 CLINICAL DATA:  Possible sepsis. EXAM: PORTABLE CHEST 1 VIEW COMPARISON:  08/25/2021. FINDINGS: The heart size and mediastinal contours are within normal limits. There is mild hyperinflation of the lungs. No consolidation, effusion, or pneumothorax. No acute osseous abnormality. IMPRESSION: No  active disease. Electronically Signed   By: Thornell Sartorius M.D.   On: 10/22/2021 00:54   CT CHEST ABDOMEN PELVIS WO CONTRAST  Result Date: 10/22/2021 CLINICAL DATA:  Found down.  Polytrauma, blunt EXAM: CT CHEST, ABDOMEN AND PELVIS WITHOUT CONTRAST TECHNIQUE: Multidetector CT imaging of the chest, abdomen and pelvis was performed following the standard protocol without IV contrast. RADIATION DOSE REDUCTION: This exam was performed according to the departmental dose-optimization program which includes automated exposure control, adjustment of the mA and/or kV according to patient size and/or use of iterative reconstruction technique. COMPARISON:  None. FINDINGS: CT CHEST FINDINGS Cardiovascular: Heart is normal size. Aorta is normal caliber. Scattered coronary artery and aortic calcifications. Mediastinum/Nodes: No mediastinal, hilar, or axillary adenopathy. Trachea and esophagus are unremarkable. Thyroid unremarkable. Lungs/Pleura: No confluent opacities, effusions or pneumothorax. Musculoskeletal: Chest wall soft tissues are unremarkable. Multiple chronic appearing left rib fractures. No definite acute rib fracture. CT ABDOMEN PELVIS FINDINGS Hepatobiliary: Numerous gallstones layering within the gallbladder. Diffuse low-density throughout the liver compatible with fatty infiltration. No evidence of a hepatic injury or perihepatic hematoma. Pancreas: No focal abnormality or ductal dilatation. Spleen: No splenic injury or perisplenic hematoma. Adrenals/Urinary Tract: No adrenal hemorrhage or renal injury identified. Bladder is unremarkable. Stomach/Bowel: Scattered colonic diverticula. No active diverticulitis. Stomach and small bowel grossly unremarkable. Vascular/Lymphatic: Aortic atherosclerosis. No evidence of aneurysm or adenopathy. Reproductive: No visible focal abnormality. Other: No free fluid or free air. Musculoskeletal: Mild compression deformity at L1, age indeterminate. Six IMPRESSION: Multiple old  left rib fractures. Age-indeterminate mild compression fracture at L1. No acute findings or evidence of significant traumatic injury in the chest, abdomen or pelvis. Cholelithiasis. Hepatic steatosis. Coronary artery disease, aortic atherosclerosis. Electronically Signed   By: Charlett Nose M.D.   On: 10/22/2021 01:37    EKG: I have personally reviewed EKG: NSR, prolonged Qtc   Assessment/Plan Principal Problem:   Cellulitis Active Problems:   Irritant contact dermatitis due to incontinence of both feces and urine   Sepsis with acute organ dysfunction without septic shock (HCC)   Rhabdomyolysis   Alcohol use disorder, severe, dependence (HCC)   Elevated LFTs   Tobacco abuse    Cellulitis Admit to observation telemetry bed. IV rocephin, IV Vancomycin.  Irritant contact dermatitis due to incontinence of both feces and urine Wound care consult. Ideally would give him diflucan to help with likely candidiasis of his skin but given prolonged QTC, will need to stick with topical nystatin cream. Need good skin hygiene and high protein diet to help heal his  skin. Nutrition consult to help with high protein diet.  Rhabdomyolysis Mild. Continue with IVf. Repeat CK in AM.  Alcohol use disorder, severe, dependence ([place]) CIWA protocol.  Elevated LFTs Due to alcohol abuse and hx of cirrhosis/hepatic steatosis.  Tobacco abuse Chronic.  Sepsis with acute organ dysfunction without septic shock (HCC) Due to cellulitis. Had elevated lactic acid on arrival. Improved with IVF.  DVT prophylaxis: SQ Heparin Code Status: Full Code Family Communication: no family at bedside  Disposition Plan: return home  Consults called: none  Admission status: Observation, Telemetry bed   Kristopher Oppenheim, DO [facility] 10/22/2021, 4:51 AM

## 2021-10-22 NOTE — Assessment & Plan Note (Addendum)
Improved with IVF ?

## 2021-10-22 NOTE — Assessment & Plan Note (Addendum)
Significant improvement in dermatitis/erythematic area in bilateral thighs-minimal erythema persists on exam.

## 2021-10-22 NOTE — Subjective & Objective (Signed)
CC: found down HPI: 56 year old male with a history of chronic alcohol abuse, alcoholic cirrhosis, malnutrition, presents to the ER today after being found down on the floor.  Patient states that he has been sleeping on the floor for the last 4 days on his back.  He has been unable to get off the floor.  He has been urinating and defecating on himself.  His housekeeper came over today and called EMS.  On arrival, temp 98.2, heart rate 111, blood pressure 124/94.  According to nurse, patient covered in feces and old urine.  When he was cleaned up, his perineum, thighs, lower abdomen were erythematous and partially denuded.  Initial labs showed a lactic acid of 4.4.  He was COVID-positive. Total CK of 1490 White count of 13.3  After IV fluids, lactate decreased to 2.3.  To the patient cellulitis, rhabdomyolysis and sepsis with acute organ dysfunction, [facility] hospitalist contacted for admission.

## 2021-10-22 NOTE — Progress Notes (Signed)
Pharmacy Antibiotic Note  Dylan Weaver is a 56 y.o. male admitted on 10/22/2021 with cellulitis.  Pharmacy has been consulted for vancomycin dosing.  WBC elevated. SCr wnl   Plan: -Vancomycin 1500 mg x 1 dose followed by vancomycin 1750 mg IV Q 24 hours -Monitor CBC, renal fx, cultures and clinical progress      Temp (24hrs), Avg:98.2 F (36.8 C), Min:98.2 F (36.8 C), Max:98.2 F (36.8 C)  Recent Labs  Lab 10/22/21 0103 10/22/21 0104 10/22/21 0300  WBC 13.3*  --   --   CREATININE 1.06  --   --   LATICACIDVEN  --  4.4* 2.3*    CrCl cannot be calculated (Unknown ideal weight.).    No Known Allergies  Antimicrobials this admission: Ceftriaxone 1/21 >>  Vancomycin 1/21 >>   Dose adjustments this admission:   Microbiology results: 1/21 BCx:  1/21 UCx:     Thank you for allowing pharmacy to be a part of this patients care.  Vinnie Level, PharmD., BCPS, BCCCP Clinical Pharmacist Please refer to [facility] for unit-specific pharmacist

## 2021-10-22 NOTE — ED Notes (Signed)
Pt remains confused, follows commands, answers simple questions, remains disoriented to place and event. Oriented to year and month.
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
## 2021-10-22 NOTE — Progress Notes (Signed)
Patient ID: Dylan Weaver, male   DOB: [birth year], 56 y.o.   MRN: [id number]  Chart reviewed lactic acidosis resolving with IV fluids continue antibiotics

## 2021-10-22 NOTE — Sepsis Progress Note (Signed)
Following for sepsis monitoring ?

## 2021-10-22 NOTE — Assessment & Plan Note (Addendum)
Sepsis physiology resolved resolved-repeat blood cultures negative-completed a course of antibiotics.  ID consulted during this hospital stay.

## 2021-10-22 NOTE — ED Notes (Signed)
Pt with persistent red, excoriated skin to lower torso/groin,  Voiding using urinal, but incontinent at times.  Awaiting wound/ostomy consult.

## 2021-10-22 NOTE — Assessment & Plan Note (Signed)
Chronic. 

## 2021-10-22 NOTE — ED Notes (Signed)
Patient transported to CT 
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
## 2021-10-22 NOTE — Progress Notes (Signed)
Elink camera in to pt due to tachy rhythm, ? Noisy pattern with concern to find pt's nurse right beside him checking his pulse, reported shivers, made RN aware if I could help/making calls then would be more than glad, RN stated she was preparing to do ECG. Reminded RN about Rapid Response as well

## 2021-10-22 NOTE — ED Provider Notes (Addendum)
[facility]EMERGENCY DEPT [facility] Emergency Department Provider Note MRN:  [id number]  Arrival date & time: 10/22/21     Chief Complaint   Possible sepsis   History of Present Illness   Dylan Weaver is a 56 y.o. year-old male with a history of alcohol abuse presenting to the ED with chief complaint of possible sepsis.  Patient found on the ground of his residence by a new care provider.  Concern from EMS that he was on the ground possibly for a week.  Patient denies falling, explains that he was having some back pain and he thought that laying down would help the back pain but then he could not get up.  Review of Systems  A thorough review of systems was obtained and all systems are negative except as noted in the HPI and PMH.   Patient's Health History    Past Medical History:  Diagnosis Date   Acute liver failure with hepatic coma (HCC) 06/04/2021   ETOH abuse     Past Surgical History:  Procedure Laterality Date   BIOPSY  06/08/2021   Procedure: BIOPSY;  Surgeon: Imogene Burn, MD;  Location: [facility] ENDOSCOPY;  Service: Endoscopy;;   BIOPSY  06/10/2021   Procedure: BIOPSY;  Surgeon: Lynann Bologna, MD;  Location: [facility] ENDOSCOPY;  Service: Gastroenterology;;   COLONOSCOPY WITH PROPOFOL N/A 06/10/2021   Procedure: COLONOSCOPY WITH PROPOFOL;  Surgeon: Lynann Bologna, MD;  Location: [facility] ENDOSCOPY;  Service: Gastroenterology;  Laterality: N/A;   ESOPHAGOGASTRODUODENOSCOPY (EGD) WITH PROPOFOL N/A 06/08/2021   Procedure: ESOPHAGOGASTRODUODENOSCOPY (EGD) WITH PROPOFOL;  Surgeon: Imogene Burn, MD;  Location: [facility] ENDOSCOPY;  Service: Endoscopy;  Laterality: N/A;   NASAL SEPTUM SURGERY     WRIST SURGERY      Family History  Problem Relation Age of Onset   Hypertension Other     Social History   Socioeconomic History   Marital status: Divorced    Spouse name: Not on file   Number of children: Not on file   Years of education: Not on file   Highest education level: Not on file   Occupational History   Not on file  Tobacco Use   Smoking status: Every Day    Packs/day: 0.50    Types: Cigarettes   Smokeless tobacco: Never  Vaping Use   Vaping Use: Never used  Substance and Sexual Activity   Alcohol use: Yes    Comment: 12 pack a day beer   Drug use: Never   Sexual activity: Not Currently  Other Topics Concern   Not on file  Social History Narrative   Not on file   Social Determinants of Health   Financial Resource Strain: Not on file  Food Insecurity: Not on file  Transportation Needs: Not on file  Physical Activity: Not on file  Stress: Not on file  Social Connections: Not on file  Intimate Partner Violence: Not on file     Physical Exam   Vitals:   10/22/21 0430 10/22/21 0545  BP: 104/67 105/74  Pulse: (!) 105 100  Resp: (!) 23 (!) 26  Temp:    SpO2: 99% 100%    CONSTITUTIONAL: Ill-appearing, NAD NEURO/PSYCH: Alert, oriented to name, moves all extremities EYES:  eyes equal and reactive ENT/NECK:  no LAD, no JVD CARDIO: Regular rate, well-perfused, normal S1 and S2 PULM:  CTAB no wheezing or rhonchi GI/GU:  non-distended, non-tender MSK/SPINE:  No gross deformities, no edema SKIN: Diffuse erythema to the perineum   *Additional and/or pertinent  findings included in MDM below  Diagnostic and Interventional Summary    EKG Interpretation  Date/Time:  Saturday October 22 2021 03:01:33 EST Ventricular Rate:  104 PR Interval:  163 QRS Duration: 87 QT Interval:  416 QTC Calculation: 548 R Axis:   69 Text Interpretation: Sinus tachycardia Ventricular premature complex Aberrant complex Consider left atrial enlargement Low voltage, precordial leads Borderline T abnormalities, anterior leads Prolonged QT interval Confirmed by Kennis Carina [phone]) on 10/22/2021 3:16:44 AM       Labs Reviewed  RESP PANEL BY RT-PCR (FLU A&B, COVID) ARPGX2 - Abnormal; Notable for the following components:      Result Value   SARS Coronavirus 2 by RT  PCR POSITIVE (*)    All other components within normal limits  LACTIC ACID, PLASMA - Abnormal; Notable for the following components:   Lactic Acid, Venous 4.4 (*)    All other components within normal limits  LACTIC ACID, PLASMA - Abnormal; Notable for the following components:   Lactic Acid, Venous 2.3 (*)    All other components within normal limits  COMPREHENSIVE METABOLIC PANEL - Abnormal; Notable for the following components:   CO2 20 (*)    Glucose, Bld 112 (*)    Total Protein 9.7 (*)    Albumin 2.9 (*)    AST 189 (*)    ALT 98 (*)    Alkaline Phosphatase 172 (*)    Total Bilirubin 1.9 (*)    Anion gap 19 (*)    All other components within normal limits  CBC WITH DIFFERENTIAL/PLATELET - Abnormal; Notable for the following components:   WBC 13.3 (*)    RDW 18.2 (*)    Platelets 85 (*)    Neutro Abs 10.7 (*)    Monocytes Absolute 1.3 (*)    Abs Immature Granulocytes 0.12 (*)    All other components within normal limits  CK - Abnormal; Notable for the following components:   Total CK 1,490 (*)    All other components within normal limits  CULTURE, BLOOD (ROUTINE X 2)  CULTURE, BLOOD (ROUTINE X 2)  URINE CULTURE  PROTIME-INR  APTT  URINALYSIS, ROUTINE W REFLEX MICROSCOPIC  COMPREHENSIVE METABOLIC PANEL  HIV ANTIBODY (ROUTINE TESTING W REFLEX)    CT HEAD WO CONTRAST ( )  Final Result    CT CERVICAL SPINE WO CONTRAST  Final Result    CT CHEST ABDOMEN PELVIS WO CONTRAST  Final Result    DG Chest Port 1 View  Final Result      Medications  vancomycin (VANCOREADY) IVPB 1500 mg/300 mL (1,500 mg Intravenous New Bag/Given 10/22/21 0528)  vancomycin (VANCOREADY) IVPB 1750 mg/350 mL (has no administration in time range)  nystatin cream (MYCOSTATIN) (has no administration in time range)  LORazepam (ATIVAN) tablet 1-4 mg (has no administration in time range)    Or  LORazepam (ATIVAN) injection 1-4 mg (has no administration in time range)  folic acid (FOLVITE) tablet  1 mg (has no administration in time range)  multivitamin with minerals tablet 1 tablet (has no administration in time range)  thiamine (B-1) injection 100 mg (has no administration in time range)  magnesium oxide (MAG-OX) tablet 400 mg (has no administration in time range)  heparin injection 5,000 Units (5,000 Units Subcutaneous Given 10/22/21 0522)  nicotine (NICODERM CQ - dosed in mg/24 hours) patch 21 mg (has no administration in time range)  cefTRIAXone (ROCEPHIN) 1 g in sodium chloride 0.9 % 100 mL IVPB (has no administration in time range)  lactated ringers bolus 2,000 mL (0 mLs Intravenous Stopped 10/22/21 0501)  cefTRIAXone (ROCEPHIN) 2 g in sodium chloride 0.9 % 100 mL IVPB (0 g Intravenous Stopped 10/22/21 0142)  lactated ringers bolus 1,000 mL (0 mLs Intravenous Stopped 10/22/21 0635)  magnesium sulfate IVPB 2 g 50 mL (0 g Intravenous Stopped 10/22/21 0635)     Procedures  /  Critical Care .Critical Care Performed by: Sabas SousBero, Doyce Stonehouse M, MD Authorized by: Sabas SousBero, Miner Koral M, MD   Critical care provider statement:    Critical care time (minutes):  33   Critical care was necessary to treat or prevent imminent or life-threatening deterioration of the following conditions:  Sepsis   Critical care was time spent personally by me on the following activities:  Development of treatment plan with patient or surrogate, discussions with consultants, evaluation of patient's response to treatment, examination of patient, ordering and review of laboratory studies, ordering and review of radiographic studies, ordering and performing treatments and interventions, pulse oximetry, re-evaluation of patient's condition and review of old charts  ED Course and Medical Decision Making  Initial Impression and Ddx Concern for cellulitis, tissue breakdown from extended downtime, rhabdomyolysis, concern for likely electrolyte disturbance, AKI, also concern for possible traumatic injury.  Given the extent of the rash  that is concerning for cellulitis, starting a code sepsis protocol, providing fluids and antibiotics.  Past medical/surgical history that increases complexity of ED encounter: Alcohol abuse  Interpretation of Diagnostics I personally reviewed the EKG and my interpretation is as follows: Sinus tachycardia    Labs reveal elevated lactate, elevated CK  Patient Reassessment and Ultimate Disposition/Management Admitted to hospitalist for further care.  Patient management required discussion with the following services or consulting groups:  Hospitalist Service  Complexity of Problems Addressed Acute illness or injury that poses threat of life of bodily function  Additional Data Reviewed and Analyzed Further history obtained from: Past medical history and medications listed in the EMR  Factors Impacting ED Encounter Risk Consideration of hospitalization  Elmer SowMichael M. Pilar PlateBero, MD [facility] [email or web address]  Final Clinical Impressions(s) / ED Diagnoses     ICD-10-CM   1. Cellulitis, unspecified cellulitis site  L03.90     2. Non-traumatic rhabdomyolysis  M62.82       ED Discharge Orders     None        Discharge Instructions Discussed with and Provided to Patient:   Discharge Instructions   None      Sabas SousBero, Eliyana Pagliaro M, MD 10/22/21 [id number]    Sabas SousBero, Uyen Eichholz M, MD 10/22/21 [phone]

## 2021-10-22 NOTE — Progress Notes (Signed)
Overnight progress note  Notified by RN that patient is undergoing severe withdrawal with CIWA score 20 -tremors, agitation, and hallucinations.  About an hour ago he became extremely tachycardic with heart rate in the 170s to 180s.  He was given IV Ativan 3 mg after which symptoms improved and his heart rate came down to 90-100.  Patient has a history of alcohol abuse and drinks a 12 pack of beer daily.  His last drink was likely 4 days ago when he fell and was on the floor since then until housekeeper called EMS today.  MAR reviewed, patient received IV Ativan 1 mg in the morning at 9:18 AM.  He did not receive any additional doses throughout the day until tonight at 8:41 PM when he received IV Ativan 3 mg.  -He is currently on 5W telemetry unit, upgrade bed status to progressive care unit -Continue CIWA protocol; Ativan as needed -Stat EKG ordered -Stat labs ordered to check potassium and magnesium levels -May need Precedex/ICU transfer if he continues to undergo severe withdrawal despite multiple doses of IV Ativan.

## 2021-10-22 NOTE — ED Notes (Signed)
Eating and drinking without n/v.

## 2021-10-22 NOTE — Assessment & Plan Note (Addendum)
Multifactorial etiology-from rhabdomyolysis/alcoholic hepatitis and underlying cirrhosis.  LFTs have stabilized-repeat electrolytes periodically

## 2021-10-23 DIAGNOSIS — L03119 Cellulitis of unspecified part of limb: Secondary | ICD-10-CM | POA: Diagnosis not present

## 2021-10-23 LAB — CBC WITH DIFFERENTIAL/PLATELET
Abs Immature Granulocytes: 0.08 10*3/uL — ABNORMAL HIGH (ref 0.00–0.07)
Basophils Absolute: 0 10*3/uL (ref 0.0–0.1)
Basophils Relative: 0 %
Eosinophils Absolute: 0 10*3/uL (ref 0.0–0.5)
Eosinophils Relative: 0 %
HCT: 35.4 % — ABNORMAL LOW (ref 39.0–52.0)
Hemoglobin: 11.6 g/dL — ABNORMAL LOW (ref 13.0–17.0)
Immature Granulocytes: 1 %
Lymphocytes Relative: 12 %
Lymphs Abs: 1.5 10*3/uL (ref 0.7–4.0)
MCH: 31.5 pg (ref 26.0–34.0)
MCHC: 32.8 g/dL (ref 30.0–36.0)
MCV: 96.2 fL (ref 80.0–100.0)
Monocytes Absolute: 1 10*3/uL (ref 0.1–1.0)
Monocytes Relative: 7 %
Neutro Abs: 10.5 10*3/uL — ABNORMAL HIGH (ref 1.7–7.7)
Neutrophils Relative %: 80 %
Platelets: 110 10*3/uL — ABNORMAL LOW (ref 150–400)
RBC: 3.68 MIL/uL — ABNORMAL LOW (ref 4.22–5.81)
RDW: 17.3 % — ABNORMAL HIGH (ref 11.5–15.5)
WBC: 13.2 10*3/uL — ABNORMAL HIGH (ref 4.0–10.5)
nRBC: 0 % (ref 0.0–0.2)

## 2021-10-23 LAB — BLOOD CULTURE ID PANEL (REFLEXED) - BCID2

## 2021-10-23 LAB — LACTIC ACID, PLASMA
Lactic Acid, Venous: 1.4 mmol/L (ref 0.5–1.9)
Lactic Acid, Venous: 0.9 mmol/L (ref 0.5–1.9)

## 2021-10-23 LAB — MAGNESIUM: Magnesium: 1.8 mg/dL (ref 1.7–2.4)

## 2021-10-23 LAB — CK: Total CK: 855 U/L — ABNORMAL HIGH (ref 49–397)

## 2021-10-23 LAB — POTASSIUM: Potassium: 3.9 mmol/L (ref 3.5–5.1)

## 2021-10-23 MED ORDER — CHLORHEXIDINE GLUCONATE CLOTH 2 % EX PADS
6.0000 | MEDICATED_PAD | Freq: Every day | CUTANEOUS | Status: DC
  Administered 2021-10-23 – 2021-11-04 (×11): 6 via TOPICAL

## 2021-10-23 MED ORDER — SODIUM CHLORIDE 0.9 % IV SOLN: INTRAVENOUS | Status: AC

## 2021-10-23 MED ORDER — BACITRACIN ZINC 500 UNIT/GM EX OINT
TOPICAL_OINTMENT | Freq: Two times a day (BID) | CUTANEOUS | Status: AC
  Filled 2021-10-23: qty 28.4

## 2021-10-23 MED ORDER — GERHARDT'S BUTT CREAM
TOPICAL_CREAM | Freq: Three times a day (TID) | CUTANEOUS | Status: DC
  Administered 2021-10-27: 1 via TOPICAL
  Filled 2021-10-23 (×2): qty 1

## 2021-10-23 NOTE — Consult Note (Signed)
WOC Nurse Consult Note: Reason for Consult: Severe MASD, irritant contact dermatitis due to incontinence of urine and stool. Fungal overgrowth suspected. Sutures on nose.  Wound type:MASD, irritant contact dermatis, erythema intertrigo. ICD-10 CM Codes for Irritant Dermatitis  L24A2 - Due to fecal, urinary or dual incontinence L30.4  - Erythema intertrigo. Also used for abrasion of the hand, chafing of the skin, dermatitis due to sweating and friction, friction dermatitis, friction eczema, and genital/thigh intertrigo.   Pressure Injury POA: N/A Measurement:N/A  Diffuse areas of skin affected.  See photo submitted for documentation by Dr. Onalee Hua and uploaded to EMR. Full thickjness areas of skin loss to the anterior and lateral thighs. Wound bed:red, moist Drainage (amount, consistency, odor) serous, small amount Periwound: erythema, edema Dressing procedure/placement/frequency: POC is developed with assistance from the Bedside RN, K. Hasslock telephonically.  Patient now with indwelling urinary catheter, so moisture source has been removed.  I will still provide a mattress replacement to further mitigate microclimate and provide pressure redistribution and add a pressure redistribution chair pad for when he is OOB to chair. Prevalon boots to heels. Topical care to the nose sutured area will be with bacitracin twice daily.  Topical care to the abdomen, thighs, scrotum will be with Gerhart's Butt Cream, a compounded 1:1:1 preparation consisting of zinc oxide, hydrocortisone cream and Lotrimin cream. Topical wound care will be with white petrolatum gauze (folded), Vaseline that can be applied when he is in the supine position and covered with an ABD pad, but not secured. When the surrounding skin heals/recovers we may be able to use silicone foam dressings. DermaTherapy Bed linens (including a gown) are to be used on this patient as they are antimicrobial and provide an environment that is conducive to  wound healing.  The low friction coefficient (CoF) is also an adjunctive therapy.  WOC nursing team will not follow, but will remain available to this patient, the nursing and medical teams.  Please re-consult if needed. Thanks, Ladona Mow, MSN, RN, GNP, [facility] [place]  Pager# [phone]

## 2021-10-23 NOTE — Progress Notes (Addendum)
CC: found down HPI: 56 year old male with a history of chronic alcohol abuse, alcoholic cirrhosis, malnutrition, presents to the ER today after being found down on the floor.  Patient states that he has been sleeping on the floor for the last 4 days on his back.  He has been unable to get off the floor.  He has been urinating and defecating on himself.  His housekeeper came over today and called EMS.   On arrival, temp 98.2, heart rate 111, blood pressure 124/94.   According to nurse, patient covered in feces and old urine.  When he was cleaned up, his perineum, thighs, lower abdomen were erythematous and partially denuded.   Initial labs showed a lactic acid of 4.4.  He was COVID-positive. Total CK of 1490 White count of 13.3   After IV fluids, lactate decreased to 2.3.   To the patient cellulitis, rhabdomyolysis and sepsis with acute organ dysfunction, [facility] hospitalist contacted for admission.    ED Course: noted to have lactic acidosis. 3 L LR ordered. Ct abd/pelvis negative for perineal abscess  Subjective Worsening alcohol withdrawal through the night pleasantly sedated right now does not appear agitated  Physical Exam: Vitals:   10/23/21 0052 10/23/21 0332 10/23/21 0545 10/23/21 0851  BP: 98/72 102/62 105/67 93/61  Pulse: 100 (!) 105 97 86  Resp: 20 18  20   Temp:  97.8 F (36.6 C)  (!) 97.4 F (36.3 C)  TempSrc: Axillary Oral  Oral  SpO2: 99% 100%  96%    Physical Exam Vitals and nursing note reviewed.  Constitutional:      General: He is not in acute distress.    Appearance: He is ill-appearing. He is not toxic-appearing.     Comments: Chronically ill-appearing pleasantly sedated with Ativan  HENT:     Head:     Comments: Multiple bruises to his face Eyes:     General: No scleral icterus. Cardiovascular:     Rate and Rhythm: Regular rhythm. Tachycardia present.  Pulmonary:     Effort: Pulmonary effort is normal. No respiratory distress.     Breath sounds: No  wheezing.  Abdominal:     General: Bowel sounds are normal.     Palpations: Abdomen is soft.     Tenderness: There is no abdominal tenderness.  Skin:    Comments: Multiple areas of denuded skin over his thighs.  He has bright red erythema over the entire lower abdomen and upper thighs including his penis scrotum and perineum see picture.  Neurological:     Mental Status: He is disoriented.     Labs on Admission: I have personally reviewed following labs and imaging studies  CBC: Recent Labs  Lab 10/22/21 0103 10/23/21 0049  WBC 13.3* 13.2*  NEUTROABS 10.7* 10.5*  HGB 16.7 11.6*  HCT 51.9 35.4*  MCV 99.4 96.2  PLT 85* 110*   Basic Metabolic Panel: Recent Labs  Lab 10/22/21 0103 10/22/21 0731 10/23/21 0049  NA 137 134*  --   K 3.9 3.1* 3.9  CL 98 100  --   CO2 20* 23  --   GLUCOSE 112* 112*  --   BUN 16 14  --   CREATININE 1.06 0.74  --   CALCIUM 9.4 8.3*  --   MG  --   --  1.8   GFR: CrCl cannot be calculated (Unknown ideal weight.). Liver Function Tests: Recent Labs  Lab 10/22/21 0103 10/22/21 0731  AST 189* 120*  ALT 98* 67*  ALKPHOS  172* 123  BILITOT 1.9* 1.0  PROT 9.7* 6.6  ALBUMIN 2.9* 2.0*   No results for input(s): LIPASE, AMYLASE in the last 168 hours. No results for input(s): AMMONIA in the last 168 hours. Coagulation Profile: Recent Labs  Lab 10/22/21 0334  INR 1.0   Cardiac Enzymes: Recent Labs  Lab 10/22/21 0103 10/22/21 1812 10/23/21 0049  CKTOTAL 1,490* 934* 855*   BNP (last 3 results) No results for input(s): PROBNP in the last 8760 hours. HbA1C: No results for input(s): HGBA1C in the last 72 hours. CBG: No results for input(s): GLUCAP in the last 168 hours. Lipid Profile: No results for input(s): CHOL, HDL, LDLCALC, TRIG, CHOLHDL, LDLDIRECT in the last 72 hours. Thyroid Function Tests: No results for input(s): TSH, T4TOTAL, FREET4, T3FREE, THYROIDAB in the last 72 hours. Anemia Panel: No results for input(s): VITAMINB12,  FOLATE, FERRITIN, TIBC, IRON, RETICCTPCT in the last 72 hours. Urine analysis:    Component Value Date/Time   COLORURINE AMBER (A) 10/22/2021 0026   APPEARANCEUR CLEAR 10/22/2021 0026   LABSPEC 1.024 10/22/2021 0026   PHURINE 5.0 10/22/2021 0026   GLUCOSEU NEGATIVE 10/22/2021 0026   HGBUR NEGATIVE 10/22/2021 0026   BILIRUBINUR NEGATIVE 10/22/2021 0026   KETONESUR 5 (A) 10/22/2021 0026   PROTEINUR NEGATIVE 10/22/2021 0026   NITRITE NEGATIVE 10/22/2021 0026   LEUKOCYTESUR TRACE (A) 10/22/2021 0026    Radiological Exams on Admission: I have personally reviewed images CT HEAD WO CONTRAST ( )  Result Date: 10/22/2021 CLINICAL DATA:  Found down, head and neck trauma. EXAM: CT HEAD WITHOUT CONTRAST CT CERVICAL SPINE WITHOUT CONTRAST TECHNIQUE: Multidetector CT imaging of the head and cervical spine was performed following the standard protocol without intravenous contrast. Multiplanar CT image reconstructions of the cervical spine were also generated. RADIATION DOSE REDUCTION: This exam was performed according to the departmental dose-optimization program which includes automated exposure control, adjustment of the mA and/or kV according to patient size and/or use of iterative reconstruction technique. COMPARISON:  09/20/2021. FINDINGS: CT HEAD FINDINGS Brain: No acute intracranial hemorrhage, midline shift or mass effect. No extra-axial fluid collection. Mild atrophy is noted. Periventricular white matter hypodensities are noted bilaterally. No hydrocephalus. Vascular: No hyperdense vessel or unexpected calcification. Skull: Normal. Negative for fracture or focal lesion. Sinuses/Orbits: No acute finding. Other: None. CT CERVICAL SPINE FINDINGS Alignment: Mild retrolisthesis at C3-C4. Skull base and vertebrae: No acute fracture. No primary bone lesion or focal pathologic process. Soft tissues and spinal canal: No prevertebral fluid or swelling. No visible canal hematoma. Disc levels: Multilevel  intervertebral disc space narrowing, disc herniation, osteophyte formation, and facet arthropathy resulting in mild to moderate stenosis, not significantly changed from the prior exam. Upper chest: Apical pleural blebs are present bilaterally. Other: None. IMPRESSION: 1. No acute intracranial process. 2. Atrophy with chronic microvascular ischemic changes. 3. Degenerative changes in the cervical spine without evidence of acute fracture. Electronically Signed   By: Thornell Sartorius M.D.   On: 10/22/2021 01:40   CT CERVICAL SPINE WO CONTRAST  Result Date: 10/22/2021 CLINICAL DATA:  Found down, head and neck trauma. EXAM: CT HEAD WITHOUT CONTRAST CT CERVICAL SPINE WITHOUT CONTRAST TECHNIQUE: Multidetector CT imaging of the head and cervical spine was performed following the standard protocol without intravenous contrast. Multiplanar CT image reconstructions of the cervical spine were also generated. RADIATION DOSE REDUCTION: This exam was performed according to the departmental dose-optimization program which includes automated exposure control, adjustment of the mA and/or kV according to patient size and/or use of iterative  reconstruction technique. COMPARISON:  09/20/2021. FINDINGS: CT HEAD FINDINGS Brain: No acute intracranial hemorrhage, midline shift or mass effect. No extra-axial fluid collection. Mild atrophy is noted. Periventricular white matter hypodensities are noted bilaterally. No hydrocephalus. Vascular: No hyperdense vessel or unexpected calcification. Skull: Normal. Negative for fracture or focal lesion. Sinuses/Orbits: No acute finding. Other: None. CT CERVICAL SPINE FINDINGS Alignment: Mild retrolisthesis at C3-C4. Skull base and vertebrae: No acute fracture. No primary bone lesion or focal pathologic process. Soft tissues and spinal canal: No prevertebral fluid or swelling. No visible canal hematoma. Disc levels: Multilevel intervertebral disc space narrowing, disc herniation, osteophyte formation,  and facet arthropathy resulting in mild to moderate stenosis, not significantly changed from the prior exam. Upper chest: Apical pleural blebs are present bilaterally. Other: None. IMPRESSION: 1. No acute intracranial process. 2. Atrophy with chronic microvascular ischemic changes. 3. Degenerative changes in the cervical spine without evidence of acute fracture. Electronically Signed   By: Thornell SartoriusLaura  Taylor M.D.   On: 10/22/2021 01:40   DG Chest Port 1 View  Result Date: 10/22/2021 CLINICAL DATA:  Possible sepsis. EXAM: PORTABLE CHEST 1 VIEW COMPARISON:  08/25/2021. FINDINGS: The heart size and mediastinal contours are within normal limits. There is mild hyperinflation of the lungs. No consolidation, effusion, or pneumothorax. No acute osseous abnormality. IMPRESSION: No active disease. Electronically Signed   By: Thornell SartoriusLaura  Taylor M.D.   On: 10/22/2021 00:54   CT CHEST ABDOMEN PELVIS WO CONTRAST  Result Date: 10/22/2021 CLINICAL DATA:  Found down.  Polytrauma, blunt EXAM: CT CHEST, ABDOMEN AND PELVIS WITHOUT CONTRAST TECHNIQUE: Multidetector CT imaging of the chest, abdomen and pelvis was performed following the standard protocol without IV contrast. RADIATION DOSE REDUCTION: This exam was performed according to the departmental dose-optimization program which includes automated exposure control, adjustment of the mA and/or kV according to patient size and/or use of iterative reconstruction technique. COMPARISON:  None. FINDINGS: CT CHEST FINDINGS Cardiovascular: Heart is normal size. Aorta is normal caliber. Scattered coronary artery and aortic calcifications. Mediastinum/Nodes: No mediastinal, hilar, or axillary adenopathy. Trachea and esophagus are unremarkable. Thyroid unremarkable. Lungs/Pleura: No confluent opacities, effusions or pneumothorax. Musculoskeletal: Chest wall soft tissues are unremarkable. Multiple chronic appearing left rib fractures. No definite acute rib fracture. CT ABDOMEN PELVIS FINDINGS  Hepatobiliary: Numerous gallstones layering within the gallbladder. Diffuse low-density throughout the liver compatible with fatty infiltration. No evidence of a hepatic injury or perihepatic hematoma. Pancreas: No focal abnormality or ductal dilatation. Spleen: No splenic injury or perisplenic hematoma. Adrenals/Urinary Tract: No adrenal hemorrhage or renal injury identified. Bladder is unremarkable. Stomach/Bowel: Scattered colonic diverticula. No active diverticulitis. Stomach and small bowel grossly unremarkable. Vascular/Lymphatic: Aortic atherosclerosis. No evidence of aneurysm or adenopathy. Reproductive: No visible focal abnormality. Other: No free fluid or free air. Musculoskeletal: Mild compression deformity at L1, age indeterminate. Six IMPRESSION: Multiple old left rib fractures. Age-indeterminate mild compression fracture at L1. No acute findings or evidence of significant traumatic injury in the chest, abdomen or pelvis. Cholelithiasis. Hepatic steatosis. Coronary artery disease, aortic atherosclerosis. Electronically Signed   By: Charlett NoseKevin  Dover M.D.   On: 10/22/2021 01:37    EKG: I have personally reviewed EKG: NSR, prolonged Qtc   Assessment/Plan Principal Problem:   Cellulitis Active Problems:   Alcohol use disorder, severe, dependence (HCC)   Alcohol withdrawal (HCC)   Elevated LFTs   Tobacco abuse   Irritant contact dermatitis due to incontinence of both feces and urine   Rhabdomyolysis   Sepsis with acute organ dysfunction without septic shock (  HCC)    Cellulitis Admit to observation telemetry bed. IV rocephin, IV Vancomycin. Wound care consult still pending per nursing will not be done till Monday Overall appears less red  Irritant contact dermatitis due to incontinence of both feces and urine Wound care consult pending ideally would give him diflucan to help with likely candidiasis of his skin but given prolonged QTC, will need to stick with topical nystatin cream. Need  good skin hygiene and high protein diet to help heal his skin. Nutrition consult to help with high protein diet once more alert  Rhabdomyolysis Mild. Continue with IVf. Repeat CK in AM.  Alcohol use disorder, severe, dependence (HCC) CIWA protocol.  Mildly sedated right now with Ativan.  Continue CIWA protocol  Elevated LFTs Due to alcohol abuse and hx of cirrhosis/hepatic steatosis.  Tobacco abuse Chronic.  Sepsis with acute organ dysfunction without septic shock (HCC) Due to cellulitis. Had elevated lactic acid on arrival. Improved with IVF.  COVID-positive Difficult to tell how much symptom is having in the setting of alcohol withdrawal.  Chest x-ray negative.  Not hypoxic.  Supportive care at this time unless  becomes hypoxic or develops infiltrates on chest x-ray.   DVT prophylaxis: SQ Heparin Code Status: Full Code Family Communication: no family at bedside  Disposition Plan: return home  Consults called: none  Admission status: Observation, Telemetry bed   Sibel Khurana A, DO [facility] 10/23/2021, 11:58 AM   Patient ID: Rakesh Dutko, male   DOB: [birth year], 55 y.o.   MRN: [id number]

## 2021-10-23 NOTE — Plan of Care (Signed)
  Problem: Education: Goal: Knowledge of General Education information will improve Description Including pain rating scale, medication(s)/side effects and non-pharmacologic comfort measures Outcome: Progressing   

## 2021-10-23 NOTE — Progress Notes (Signed)
°   10/22/21 2138  Assess: MEWS Score  Temp 98.4 F (36.9 C)  BP (!) 160/58  Pulse Rate (!) 104  ECG Heart Rate (!) 108  Resp (!) 25  Level of Consciousness Alert  SpO2 100 %  O2 Device Room Air  Patient Activity (if Appropriate) In bed

## 2021-10-23 NOTE — Progress Notes (Signed)
PHARMACY - PHYSICIAN COMMUNICATION CRITICAL VALUE ALERT - BLOOD CULTURE IDENTIFICATION (BCID)  Dylan Weaver is an 56 y.o. male who presented to [facility] on 10/22/2021 with a chief complaint of ETOH withdrawal  Assessment:  2/2 BCx from same set is positive for GPCs. BCID identified Staph epideremidis with mecA+ gene. 3rd bottle remains negative. Unsure if this is a true pathogen at this pont   Name of physician (or Provider) Contacted: Dr. Loney Loh  Current antibiotics: Ceftriaxone and vancomycin   Changes to prescribed antibiotics recommended:  Patient is on recommended antibiotics - No changes needed. Will allow AM team to assess further   Results for orders placed or performed during the hospital encounter of 10/22/21  Blood Culture ID Panel (Reflexed) (Collected: 10/22/2021  3:00 AM)  Result Value Ref Range   Enterococcus faecalis NOT DETECTED NOT DETECTED   Enterococcus Faecium NOT DETECTED NOT DETECTED   Listeria monocytogenes NOT DETECTED NOT DETECTED   Staphylococcus species DETECTED (A) NOT DETECTED   Staphylococcus aureus (BCID) NOT DETECTED NOT DETECTED   Staphylococcus epidermidis DETECTED (A) NOT DETECTED   Staphylococcus lugdunensis NOT DETECTED NOT DETECTED   Streptococcus species NOT DETECTED NOT DETECTED   Streptococcus agalactiae NOT DETECTED NOT DETECTED   Streptococcus pneumoniae NOT DETECTED NOT DETECTED   Streptococcus pyogenes NOT DETECTED NOT DETECTED   A.calcoaceticus-baumannii NOT DETECTED NOT DETECTED   Bacteroides fragilis NOT DETECTED NOT DETECTED   Enterobacterales NOT DETECTED NOT DETECTED   Enterobacter cloacae complex NOT DETECTED NOT DETECTED   Escherichia coli NOT DETECTED NOT DETECTED   Klebsiella aerogenes NOT DETECTED NOT DETECTED   Klebsiella oxytoca NOT DETECTED NOT DETECTED   Klebsiella pneumoniae NOT DETECTED NOT DETECTED   Proteus species NOT DETECTED NOT DETECTED   Salmonella species NOT DETECTED NOT DETECTED   Serratia  marcescens NOT DETECTED NOT DETECTED   Haemophilus influenzae NOT DETECTED NOT DETECTED   Neisseria meningitidis NOT DETECTED NOT DETECTED   Pseudomonas aeruginosa NOT DETECTED NOT DETECTED   Stenotrophomonas maltophilia NOT DETECTED NOT DETECTED   Candida albicans NOT DETECTED NOT DETECTED   Candida auris NOT DETECTED NOT DETECTED   Candida glabrata NOT DETECTED NOT DETECTED   Candida krusei NOT DETECTED NOT DETECTED   Candida parapsilosis NOT DETECTED NOT DETECTED   Candida tropicalis NOT DETECTED NOT DETECTED   Cryptococcus neoformans/gattii NOT DETECTED NOT DETECTED   Methicillin resistance mecA/C DETECTED (A) NOT DETECTED    Vinnie Level, PharmD., BCPS, BCCCP Clinical Pharmacist Please refer to [facility] for unit-specific pharmacist

## 2021-10-24 DIAGNOSIS — L03119 Cellulitis of unspecified part of limb: Secondary | ICD-10-CM | POA: Diagnosis not present

## 2021-10-24 DIAGNOSIS — L8990 Pressure ulcer of unspecified site, unspecified stage: Secondary | ICD-10-CM | POA: Diagnosis present

## 2021-10-24 LAB — CK: Total CK: 347 U/L (ref 49–397)

## 2021-10-24 LAB — CBC
HCT: 40.3 % (ref 39.0–52.0)
Hemoglobin: 13 g/dL (ref 13.0–17.0)
MCH: 31.3 pg (ref 26.0–34.0)
MCHC: 32.3 g/dL (ref 30.0–36.0)
MCV: 96.9 fL (ref 80.0–100.0)
Platelets: 106 10*3/uL — ABNORMAL LOW (ref 150–400)
RBC: 4.16 MIL/uL — ABNORMAL LOW (ref 4.22–5.81)
RDW: 17.2 % — ABNORMAL HIGH (ref 11.5–15.5)
WBC: 3.8 10*3/uL — ABNORMAL LOW (ref 4.0–10.5)
nRBC: 0 % (ref 0.0–0.2)

## 2021-10-24 LAB — AMMONIA: Ammonia: 42 umol/L — ABNORMAL HIGH (ref 9–35)

## 2021-10-24 LAB — BASIC METABOLIC PANEL
Anion gap: 10 (ref 5–15)
BUN: 6 mg/dL (ref 6–20)
CO2: 23 mmol/L (ref 22–32)
Calcium: 8.3 mg/dL — ABNORMAL LOW (ref 8.9–10.3)
Chloride: 109 mmol/L (ref 98–111)
Creatinine, Ser: 0.53 mg/dL — ABNORMAL LOW (ref 0.61–1.24)
GFR, Estimated: >60 mL/min (ref >60)
Glucose, Bld: 79 mg/dL (ref 70–99)
Potassium: 3.5 mmol/L (ref 3.5–5.1)
Sodium: 142 mmol/L (ref 135–145)

## 2021-10-24 LAB — URINE CULTURE: Culture: NO GROWTH

## 2021-10-24 LAB — MAGNESIUM: Magnesium: 1.6 mg/dL — ABNORMAL LOW (ref 1.7–2.4)

## 2021-10-24 MED ORDER — RINGERS IV SOLN: INTRAVENOUS | Status: DC

## 2021-10-24 MED ORDER — ENSURE ENLIVE PO LIQD
237.0000 mL | Freq: Two times a day (BID) | ORAL | Status: DC
  Administered 2021-10-24 – 2021-11-04 (×19): 237 mL via ORAL

## 2021-10-24 MED ORDER — MAGNESIUM SULFATE 2 GM/50ML IV SOLN
2.0000 g | Freq: Once | INTRAVENOUS | Status: AC
  Administered 2021-10-24: 2 g via INTRAVENOUS
  Filled 2021-10-24: qty 50

## 2021-10-24 MED ORDER — VANCOMYCIN HCL 1250 MG/250ML IV SOLN
1250.0000 mg | Freq: Two times a day (BID) | INTRAVENOUS | Status: DC
  Administered 2021-10-24 – 2021-10-25 (×2): 1250 mg via INTRAVENOUS
  Filled 2021-10-24 (×2): qty 250

## 2021-10-24 MED ORDER — LACTULOSE 10 GM/15ML PO SOLN
10.0000 g | Freq: Every day | ORAL | Status: DC
  Administered 2021-10-24 – 2021-11-04 (×9): 10 g via ORAL
  Filled 2021-10-24 (×10): qty 15

## 2021-10-24 MED ORDER — JUVEN PO PACK
1.0000 | PACK | Freq: Two times a day (BID) | ORAL | Status: DC
  Administered 2021-10-24 – 2021-11-03 (×12): 1 via ORAL
  Filled 2021-10-24 (×13): qty 1

## 2021-10-24 MED ORDER — SODIUM CHLORIDE 0.9 % IV SOLN
2.0000 g | INTRAVENOUS | Status: DC
  Administered 2021-10-24: 2 g via INTRAVENOUS
  Filled 2021-10-24: qty 20

## 2021-10-24 MED ORDER — POTASSIUM CHLORIDE CRYS ER 20 MEQ PO TBCR
40.0000 meq | EXTENDED_RELEASE_TABLET | Freq: Once | ORAL | Status: AC
  Administered 2021-10-24: 40 meq via ORAL
  Filled 2021-10-24: qty 2

## 2021-10-24 NOTE — Progress Notes (Signed)
PROGRESS NOTE    Dylan Weaver  [id number] DOB: 1966-09-21 DOA: 10/22/2021 PCP: [facility]   Brief Narrative: 56 year old past medical history significant for chronic alcohol abuse, alcoholic cirrhosis, malnutrition presented to ER after being found down on the floor.  Patient stated that he has been sleeping on the floor for the last 4 days on his back.  He has been unable to get off of the floor.  He has been urinating and defecating on himself.  His housekeeper came over  and called EMS.  Patient presented with lactic acid of 4.4, COVID-positive, total CK1490, white blood cell 13.  IV fluids lactate decreased to 2.3.  Patient was admitted with cellulitis, rhabdomyolysis and sepsis with acute organ dysfunction.  CT abdomen and pelvis was negative for perineal abscess.    Assessment & Plan:   Principal Problem:   Cellulitis Active Problems:   Alcohol use disorder, severe, dependence (HCC)   Alcohol withdrawal (HCC)   Elevated LFTs   Tobacco abuse   Irritant contact dermatitis due to incontinence of both feces and urine   Rhabdomyolysis   Sepsis with acute organ dysfunction without septic shock (HCC)   Pressure injury of skin   1-Cellulitis thigh and pubic area : -2/4 Blood culture positive for Stap Hominis -Continue with vancomycin and ceftriaxone.  -plan to repeat Blood cultures   2-Alcohol use disorder, alcohol withdrawal: Continue with CIWA protocol Continue with thiamine and folic acid  Transaminases: Secondary to alcohol abuse and history of cirrhosis -Follow trend/.   Tobacco abuse: We will need counseling  Sepsis with acute organ dysfunction without septic shock: secondary to cellulitis.  Presented with hypothermia, Tachypnea RR 26, Tachycardia , leukocytosis.  Sepsis POA>  Treated with fluids, and antibiotics.   Acute metabolic encephalopathy: Secondary to alcohol withdrawal and infectious process Check ammonia level start  lactulose  if elevated  Irritant contact dermatitis due to incontinence of both feces and urine: Continue with nystatin cream  Rhabdomyolysis: CK 1490--- decreased to 147 after hydration Treated with IV fluids  Hyponatremia: Treated with IV fluids resolved  Hypokalemia: Replaced.  We will repeat another dose of oral potassium  Hypomagnesemia: Replete IV  Thrombocytopenia: Secondary to cirrhosis  COVID-positive: Chest x-ray negative, no hypoxic, Unable to provide significant history Monotor                                 See Wound documentation Below.      Pressure Injury 10/22/21 Toe (Comment  which one) Anterior;Left Unstageable - Full thickness tissue loss in which the base of the injury is covered by slough (yellow, tan, gray, green or brown) and/or eschar (tan, brown or black) in the wound bed. Right  (Active)  10/22/21 1640  Location: Toe (Comment  which one)  Location Orientation: Anterior;Left  Staging: Unstageable - Full thickness tissue loss in which the base of the injury is covered by slough (yellow, tan, gray, green or brown) and/or eschar (tan, brown or black) in the wound bed.  Wound Description (Comments): Right great toe Black wound bed  Present on Admission: Yes     Pressure Injury 10/22/21 Toe (Comment  which one) Anterior;Left Unstageable - Full thickness tissue loss in which the base of the injury is covered by slough (yellow, tan, gray, green or brown) and/or eschar (tan, brown or black) in the wound bed. left s (Active)  10/22/21 1640  Location: Toe (Comment  which one)  Location Orientation: Anterior;Left  Staging: Unstageable - Full thickness tissue loss in which the base of the injury is covered by slough (yellow, tan, gray, green or brown) and/or eschar (tan, brown or black) in the wound bed.  Wound Description (Comments): left second toe, black  Present on Admission: Yes     Pressure Injury 10/22/21 Toe (Comment  which one)  Anterior;Right Unstageable - Full thickness tissue loss in which the base of the injury is covered by slough (yellow, tan, gray, green or brown) and/or eschar (tan, brown or black) in the wound bed. right (Active)  10/22/21 1600  Location: Toe (Comment  which one)  Location Orientation: Anterior;Right  Staging: Unstageable - Full thickness tissue loss in which the base of the injury is covered by slough (yellow, tan, gray, green or brown) and/or eschar (tan, brown or black) in the wound bed.  Wound Description (Comments): right second toe, black  Present on Admission: Yes     Pressure Injury 10/22/21 Penis Distal Stage 2 -  Partial thickness loss of dermis presenting as a shallow open injury with a red, pink wound bed without slough. (Active)  10/22/21 1640  Location: Penis  Location Orientation: Distal  Staging: Stage 2 -  Partial thickness loss of dermis presenting as a shallow open injury with a red, pink wound bed without slough.  Wound Description (Comments):   Present on Admission:      Nutrition Problem: Severe Malnutrition Etiology: social / environmental circumstances (EtOH abuse)    Signs/Symptoms: severe muscle depletion, severe fat depletion    Interventions: Ensure Enlive (each supplement provides 350kcal and 20 grams of protein), MVI  Estimated body mass index is 23.73 kg/m as calculated from the following:   Height as of 09/20/21: 6' (1.829 m).   Weight as of this encounter: 79.4 kg.   DVT prophylaxis: Heparin  Code Status: Full code Family Communication: no family at bedside Disposition Plan:  Status is: Inpatient  Remains inpatient appropriate because: encephalopathy, alcohol withdrawal.         Consultants:  None  Procedures:  None  Antimicrobials:    Subjective: He is sleepy, answer few questions.   Objective: Vitals:   10/24/21 0737 10/24/21 1100 10/24/21 1159 10/24/21 1604  BP: 121/79  98/63 97/65  Pulse: 65  80 79  Resp: 19  20  20   Temp: (!) 97.4 F (36.3 C)  97.6 F (36.4 C) (!) 97.3 F (36.3 C)  TempSrc: Oral  Oral Oral  SpO2: 99%  95% 96%  Weight:  79.4 kg      Intake/Output Summary (Last 24 hours) at 10/24/2021 1626 Last data filed at 10/24/2021 0520 Gross per 24 hour  Intake 3002.38 ml  Output 700 ml  Net 2302.38 ml   Filed Weights   10/24/21 1100  Weight: 79.4 kg    Examination:  General exam: Chronic ill appearing.  Respiratory system: Clear to auscultation. Respiratory effort normal. Cardiovascular system: S1 & S2 heard, RRR.  Gastrointestinal system: Abdomen is nondistended, soft and nontender. No organomegaly or masses felt. Normal bowel sounds heard. Central nervous system: sleepy, answer few questions.  Extremities: trace edema Skin: multiples bruise on his faces.    Data Reviewed: I have personally reviewed following labs and imaging studies  CBC: Recent Labs  Lab 10/22/21 0103 10/23/21 0049 10/24/21 0851  WBC 13.3* 13.2* 3.8*  NEUTROABS 10.7* 10.5*  --   HGB 16.7 11.6* 13.0  HCT 51.9 35.4* 40.3  MCV 99.4 96.2 96.9  PLT 85*  110* [id number]*   Basic Metabolic Panel: Recent Labs  Lab 10/22/21 0103 10/22/21 0731 10/23/21 0049 10/24/21 0851  NA 137 134*  --  142  K 3.9 3.1* 3.9 3.5  CL 98 100  --  109  CO2 20* 23  --  23  GLUCOSE 112* 112*  --  79  BUN 16 14  --  6  CREATININE 1.06 0.74  --  0.53*  CALCIUM 9.4 8.3*  --  8.3*  MG  --   --  1.8 1.6*   GFR: Estimated Creatinine Clearance: 114.5 mL/min (A) (by C-G formula based on SCr of 0.53 mg/dL (L)). Liver Function Tests: Recent Labs  Lab 10/22/21 0103 10/22/21 0731  AST 189* 120*  ALT 98* 67*  ALKPHOS 172* 123  BILITOT 1.9* 1.0  PROT 9.7* 6.6  ALBUMIN 2.9* 2.0*   No results for input(s): LIPASE, AMYLASE in the last 168 hours. No results for input(s): AMMONIA in the last 168 hours. Coagulation Profile: Recent Labs  Lab 10/22/21 0334  INR 1.0   Cardiac Enzymes: Recent Labs  Lab 10/22/21 0103  10/22/21 1812 10/23/21 0049 10/24/21 0047  CKTOTAL 1,490* 934* 855* 347   BNP (last 3 results) No results for input(s): PROBNP in the last 8760 hours. HbA1C: No results for input(s): HGBA1C in the last 72 hours. CBG: No results for input(s): GLUCAP in the last 168 hours. Lipid Profile: No results for input(s): CHOL, HDL, LDLCALC, TRIG, CHOLHDL, LDLDIRECT in the last 72 hours. Thyroid Function Tests: No results for input(s): TSH, T4TOTAL, FREET4, T3FREE, THYROIDAB in the last 72 hours. Anemia Panel: No results for input(s): VITAMINB12, FOLATE, FERRITIN, TIBC, IRON, RETICCTPCT in the last 72 hours. Sepsis Labs: Recent Labs  Lab 10/22/21 0104 10/22/21 0300 10/23/21 0823 10/23/21 1140  LATICACIDVEN 4.4* 2.3* 1.4 0.9    Recent Results (from the past 240 hour(s))  Urine Culture     Status: None   Collection Time: 10/22/21 12:26 AM   Specimen: In/Out Cath Urine  Result Value Ref Range Status   Specimen Description IN/OUT CATH URINE  Final   Special Requests NONE  Final   Culture   Final    NO GROWTH Performed at [facility], [place] [address] [place], [place] [address]    Report Status 10/24/2021 FINAL  Final  Blood Culture (routine x 2)     Status: None (Preliminary result)   Collection Time: 10/22/21  1:03 AM   Specimen: BLOOD  Result Value Ref Range Status   Specimen Description BLOOD LEFT ANTECUBITAL  Final   Special Requests AEROBIC BOTTLE ONLY Blood Culture adequate volume  Final   Culture   Final    NO GROWTH 2 DAYS Performed at [facility], [place] [address] [place],  [address]    Report Status PENDING  Incomplete  Resp Panel by RT-PCR (Flu A&B, Covid) Nasopharyngeal Swab     Status: Abnormal   Collection Time: 10/22/21  1:04 AM   Specimen: Nasopharyngeal Swab; Nasopharyngeal(NP) swabs in vial transport medium  Result Value Ref Range Status   SARS Coronavirus 2 by RT PCR POSITIVE (A) NEGATIVE Final    Comment: (NOTE) SARS-CoV-2 target nucleic  acids are DETECTED.  The SARS-CoV-2 RNA is generally detectable in upper respiratory specimens during the acute phase of infection. Positive results are indicative of the presence of the identified virus, but do not rule out bacterial infection or co-infection with other pathogens not detected by the test. Clinical correlation with patient  history and other diagnostic information is necessary to determine patient infection status. The expected result is Negative.  Fact Sheet for Patients: EntrepreneurPulse.com.au  Fact Sheet for Healthcare Providers: IncredibleEmployment.be  This test is not yet approved or cleared by the [place] FDA and  has been authorized for detection and/or diagnosis of SARS-CoV-2 by FDA under an Emergency Use Authorization (EUA).  This EUA will remain in effect (meaning this test can be used) for the duration of  the COVID-19 declaration under Section 564(b)(1) of the A ct, 21 U.S.C. section 360bbb-3(b)(1), unless the authorization is terminated or revoked sooner.     Influenza A by PCR NEGATIVE NEGATIVE Final   Influenza B by PCR NEGATIVE NEGATIVE Final    Comment: (NOTE) The Xpert Xpress SARS-CoV-2/FLU/RSV plus assay is intended as an aid in the diagnosis of influenza from Nasopharyngeal swab specimens and should not be used as a sole basis for treatment. Nasal washings and aspirates are unacceptable for Xpert Xpress SARS-CoV-2/FLU/RSV testing.  Fact Sheet for Patients: EntrepreneurPulse.com.au  Fact Sheet for Healthcare Providers: IncredibleEmployment.be  This test is not yet approved or cleared by the [place] FDA and has been authorized for detection and/or diagnosis of SARS-CoV-2 by FDA under an Emergency Use Authorization (EUA). This EUA will remain in effect (meaning this test can be used) for the duration of the COVID-19 declaration under Section 564(b)(1) of  the Act, 21 U.S.C. section 360bbb-3(b)(1), unless the authorization is terminated or revoked.  Performed at [facility], [place] [address] [place], [place] [address]   Blood Culture (routine x 2)     Status: Abnormal (Preliminary result)   Collection Time: 10/22/21  3:00 AM   Specimen: BLOOD  Result Value Ref Range Status   Specimen Description BLOOD RIGHT ANTECUBITAL  Final   Special Requests   Final    BOTTLES DRAWN AEROBIC AND ANAEROBIC Blood Culture adequate volume   Culture  Setup Time   Final    GRAM POSITIVE COCCI IN BOTH AEROBIC AND ANAEROBIC BOTTLES CRITICAL RESULT CALLED TO, READ BACK BY AND VERIFIED WITH: B MANCHERIL,PHARMD@0625  10/23/21 [place]    Culture (A)  Final    STAPHYLOCOCCUS HOMINIS THE SIGNIFICANCE OF ISOLATING THIS ORGANISM FROM A SINGLE SET OF BLOOD CULTURES WHEN MULTIPLE SETS ARE DRAWN IS UNCERTAIN. PLEASE NOTIFY THE MICROBIOLOGY DEPARTMENT WITHIN ONE WEEK IF SPECIATION AND SENSITIVITIES ARE REQUIRED. CULTURE REINCUBATED FOR BETTER GROWTH Performed at [facility], [place] [address] [place], [place] [address]    Report Status PENDING  Incomplete  Blood Culture ID Panel (Reflexed)     Status: Abnormal   Collection Time: 10/22/21  3:00 AM  Result Value Ref Range Status   Enterococcus faecalis NOT DETECTED NOT DETECTED Final   Enterococcus Faecium NOT DETECTED NOT DETECTED Final   Listeria monocytogenes NOT DETECTED NOT DETECTED Final   Staphylococcus species DETECTED (A) NOT DETECTED Final    Comment: CRITICAL RESULT CALLED TO, READ BACK BY AND VERIFIED WITH: B MANCHERIL,PHARMD@0625  10/23/21 [place]    Staphylococcus aureus (BCID) NOT DETECTED NOT DETECTED Final   Staphylococcus epidermidis DETECTED (A) NOT DETECTED Final    Comment: Methicillin (oxacillin) resistant coagulase negative staphylococcus. Possible blood culture contaminant (unless isolated from more than one blood culture draw or clinical case suggests pathogenicity). No antibiotic treatment is  indicated for blood  culture contaminants. CRITICAL RESULT CALLED TO, READ BACK BY AND VERIFIED WITH: B MANCHERIL,PHARMD@0625  10/23/21 [place]    Staphylococcus lugdunensis NOT DETECTED NOT DETECTED Final   Streptococcus  species NOT DETECTED NOT DETECTED Final   Streptococcus agalactiae NOT DETECTED NOT DETECTED Final   Streptococcus pneumoniae NOT DETECTED NOT DETECTED Final   Streptococcus pyogenes NOT DETECTED NOT DETECTED Final   A.calcoaceticus-baumannii NOT DETECTED NOT DETECTED Final   Bacteroides fragilis NOT DETECTED NOT DETECTED Final   Enterobacterales NOT DETECTED NOT DETECTED Final   Enterobacter cloacae complex NOT DETECTED NOT DETECTED Final   Escherichia coli NOT DETECTED NOT DETECTED Final   Klebsiella aerogenes NOT DETECTED NOT DETECTED Final   Klebsiella oxytoca NOT DETECTED NOT DETECTED Final   Klebsiella pneumoniae NOT DETECTED NOT DETECTED Final   Proteus species NOT DETECTED NOT DETECTED Final   Salmonella species NOT DETECTED NOT DETECTED Final   Serratia marcescens NOT DETECTED NOT DETECTED Final   Haemophilus influenzae NOT DETECTED NOT DETECTED Final   Neisseria meningitidis NOT DETECTED NOT DETECTED Final   Pseudomonas aeruginosa NOT DETECTED NOT DETECTED Final   Stenotrophomonas maltophilia NOT DETECTED NOT DETECTED Final   Candida albicans NOT DETECTED NOT DETECTED Final   Candida auris NOT DETECTED NOT DETECTED Final   Candida glabrata NOT DETECTED NOT DETECTED Final   Candida krusei NOT DETECTED NOT DETECTED Final   Candida parapsilosis NOT DETECTED NOT DETECTED Final   Candida tropicalis NOT DETECTED NOT DETECTED Final   Cryptococcus neoformans/gattii NOT DETECTED NOT DETECTED Final   Methicillin resistance mecA/C DETECTED (A) NOT DETECTED Final    Comment: CRITICAL RESULT CALLED TO, READ BACK BY AND VERIFIED WITH: B MANCHERIL,PHARMD@0625  10/23/21 [place] Performed at [facility], [address], [place], [place] [address]          Radiology  Studies: No results found.      Scheduled Meds:  bacitracin   Topical BID   Chlorhexidine Gluconate Cloth  6 each Topical Daily   feeding supplement  237 mL Oral BID BM   folic acid  1 mg Oral Daily   Gerhardt's butt cream   Topical TID   heparin  5,000 Units Subcutaneous Q8H   magnesium oxide  400 mg Oral BID   multivitamin with minerals  1 tablet Oral Daily   nicotine  21 mg Transdermal Daily   nutrition supplement (JUVEN)  1 packet Oral BID BM   nystatin cream   Topical BID   Continuous Infusions:  cefTRIAXone (ROCEPHIN)  IV     ringers 75 mL/hr at 10/24/21 1017   vancomycin       LOS: 2 days    Time spent: 35 minutes    Shona Pardo A Legacy [place], MD [facility]   If 7PM-7AM, please contact night-coverage [email or web address]  10/24/2021, 4:26 PM

## 2021-10-24 NOTE — Progress Notes (Signed)
Initial Nutrition Assessment  DOCUMENTATION CODES:   Severe malnutrition in context of social or environmental circumstances  INTERVENTION:   Continue Multivitamin w/ minerals daily Ensure Enlive po BID, each supplement provides 350 kcal and 20 grams of protein 1 packet Juven BID, each packet provides 95 calories, 2.5 grams of protein (collagen), and 9.8 grams of carbohydrate (3 grams sugar); also contains 7 grams of L-arginine and L-glutamine, 300 mg vitamin C, 15 mg vitamin E, 1.2 mcg vitamin B-12, 9.5 mg zinc, 200 mg calcium, and 1.5 g  Calcium Beta-hydroxy-Beta-methylbutyrate to support wound healing  NUTRITION DIAGNOSIS:   Severe Malnutrition related to social / environmental circumstances (EtOH abuse) as evidenced by severe muscle depletion, severe fat depletion.  GOAL:   Patient will meet greater than or equal to 90% of their needs  MONITOR:   PO intake, Supplement acceptance, Skin, Weight trends  REASON FOR ASSESSMENT:   Consult Assessment of nutrition requirement/status  ASSESSMENT:   56 y.o. male presented to the ED after being found down for 4 days. PMH includes EtOH abuse w/ cirrhosis, and malnutrition. Pt admitted with cellulitis.   Found to be COVID+ on admission.   Pt reports that he lives alone, has been eating ok. Reports that majority of his food is fast food from [place]. Pt states that he thinks that he has been eating well here.  Unable to get weight history from pt. Suspect current weight is stated versus actual.   Discussed ONS with pt; pt agreeable to try Ensure. And will start Juven to promote wound healing.   Medications reviewed and include: Folic Acid, Magnesium Oxide, MVI, Potassium Chloride, Thiamine, IV antibiotics, Magnesium Sulfate Labs reviewed: Magnesium 1.6    NUTRITION - FOCUSED PHYSICAL EXAM:  Flowsheet Row Most Recent Value  Orbital Region Severe depletion  Upper Arm Region Severe depletion  Thoracic and Lumbar Region Severe  depletion  Buccal Region Severe depletion  Temple Region Severe depletion  Clavicle Bone Region Severe depletion  Clavicle and Acromion Bone Region Severe depletion  Scapular Bone Region Severe depletion  Dorsal Hand Severe depletion  Patellar Region Severe depletion  Anterior Thigh Region Severe depletion  Posterior Calf Region Severe depletion  Edema (RD Assessment) None  Hair Reviewed  Eyes Reviewed  Mouth Reviewed  Skin Reviewed  Nails Reviewed       Diet Order:   Diet Order             Diet regular Room service appropriate? No; Fluid consistency: Thin  Diet effective now                   EDUCATION NEEDS:   Not appropriate for education at this time  Skin:  Skin Assessment: Skin Integrity Issues: Skin Integrity Issues:: Unstageable, Stage II Stage II: Penis Unstageable: R Heel, L & R Toes  Last BM:  No Documentation  Height:   Ht Readings from Last 1 Encounters:  09/20/21 6' (1.829 m)    Weight:   Wt Readings from Last 1 Encounters:  10/24/21 79.4 kg    Ideal Body Weight:  80.9 kg  BMI:  Body mass index is 23.73 kg/m.  Estimated Nutritional Needs:   Kcal:  2100-2300  Protein:  105-120 grams  Fluid:  >/= 2.1 L    Jisselle Poth Louie Casa, RD, LDN Clinical Dietitian See [facility] for contact information.

## 2021-10-24 NOTE — Progress Notes (Signed)
Pharmacy Antibiotic Note  Dylan Weaver is a 56 y.o. male admitted on 10/22/2021 with cellulitis.  Pharmacy has been consulted for vancomycin dosing.   Pt is on D3 vanc/ceftriaxone for his cellulitis. Scr remains stable. Wbc down to 3.8. We will adjust his vanc dose today. Staph hominis 2/4 bottles from the same set so likely contaminant.   Plan: Change vanc 1.25g IV q12>>AUC 442, scr 0.8 Change ceftriaxone to 2g IV q12 Level as needed  Weight: 79.4 kg (175 lb)  Temp (24hrs), Avg:98 F (36.7 C), Min:97.4 F (36.3 C), Max:98.3 F (36.8 C)  Recent Labs  Lab 10/22/21 0103 10/22/21 0104 10/22/21 0300 10/22/21 0731 10/23/21 0049 10/23/21 0823 10/23/21 1140 10/24/21 0851  WBC 13.3*  --   --   --  13.2*  --   --  3.8*  CREATININE 1.06  --   --  0.74  --   --   --  0.53*  LATICACIDVEN  --  4.4* 2.3*  --   --  1.4 0.9  --      Estimated Creatinine Clearance: 114.5 mL/min (A) (by C-G formula based on SCr of 0.53 mg/dL (L)).    No Known Allergies  Antimicrobials this admission: Ceftriaxone 1/21 >>  Vancomycin 1/21 >>   Dose adjustments this admission:   Microbiology results: 1/21 BCx: 2/4 bottles staph hominis  BCID staph epi 1/21 urine>>negF 1/21 COVID +  Dylan Weaver, PharmD, [place], AAHIVP, CPP Infectious Disease Pharmacist 10/24/2021 11:25 AM

## 2021-10-25 DIAGNOSIS — R7989 Other specified abnormal findings of blood chemistry: Secondary | ICD-10-CM | POA: Diagnosis not present

## 2021-10-25 DIAGNOSIS — M6282 Rhabdomyolysis: Secondary | ICD-10-CM | POA: Diagnosis not present

## 2021-10-25 DIAGNOSIS — F1020 Alcohol dependence, uncomplicated: Secondary | ICD-10-CM | POA: Diagnosis not present

## 2021-10-25 DIAGNOSIS — L03119 Cellulitis of unspecified part of limb: Secondary | ICD-10-CM | POA: Diagnosis not present

## 2021-10-25 DIAGNOSIS — L24A2 Irritant contact dermatitis due to fecal, urinary or dual incontinence: Secondary | ICD-10-CM | POA: Diagnosis not present

## 2021-10-25 DIAGNOSIS — B952 Enterococcus as the cause of diseases classified elsewhere: Secondary | ICD-10-CM

## 2021-10-25 DIAGNOSIS — R7881 Bacteremia: Secondary | ICD-10-CM

## 2021-10-25 LAB — CBC
HCT: 36.4 % — ABNORMAL LOW (ref 39.0–52.0)
Hemoglobin: 12.2 g/dL — ABNORMAL LOW (ref 13.0–17.0)
MCH: 31.4 pg (ref 26.0–34.0)
MCHC: 33.5 g/dL (ref 30.0–36.0)
MCV: 93.8 fL (ref 80.0–100.0)
Platelets: 104 10*3/uL — ABNORMAL LOW (ref 150–400)
RBC: 3.88 MIL/uL — ABNORMAL LOW (ref 4.22–5.81)
RDW: 17 % — ABNORMAL HIGH (ref 11.5–15.5)
WBC: 4.5 10*3/uL (ref 4.0–10.5)
nRBC: 0 % (ref 0.0–0.2)

## 2021-10-25 LAB — CULTURE, BLOOD (ROUTINE X 2): Special Requests: ADEQUATE

## 2021-10-25 LAB — COMPREHENSIVE METABOLIC PANEL
ALT: 53 U/L — ABNORMAL HIGH (ref 0–44)
AST: 93 U/L — ABNORMAL HIGH (ref 15–41)
Albumin: 1.8 g/dL — ABNORMAL LOW (ref 3.5–5.0)
Alkaline Phosphatase: 128 U/L — ABNORMAL HIGH (ref 38–126)
Anion gap: 8 (ref 5–15)
BUN: 9 mg/dL (ref 6–20)
CO2: 21 mmol/L — ABNORMAL LOW (ref 22–32)
Calcium: 8 mg/dL — ABNORMAL LOW (ref 8.9–10.3)
Chloride: 109 mmol/L (ref 98–111)
Creatinine, Ser: 0.53 mg/dL — ABNORMAL LOW (ref 0.61–1.24)
GFR, Estimated: >60 mL/min (ref >60)
Glucose, Bld: 78 mg/dL (ref 70–99)
Potassium: 3.7 mmol/L (ref 3.5–5.1)
Sodium: 138 mmol/L (ref 135–145)
Total Bilirubin: 1.4 mg/dL — ABNORMAL HIGH (ref 0.3–1.2)
Total Protein: 5.9 g/dL — ABNORMAL LOW (ref 6.5–8.1)

## 2021-10-25 LAB — MAGNESIUM: Magnesium: 1.5 mg/dL — ABNORMAL LOW (ref 1.7–2.4)

## 2021-10-25 MED ORDER — SODIUM CHLORIDE 0.9 % IV SOLN
3.0000 g | Freq: Four times a day (QID) | INTRAVENOUS | Status: DC
  Administered 2021-10-25 – 2021-10-26 (×5): 3 g via INTRAVENOUS
  Filled 2021-10-25 (×5): qty 8

## 2021-10-25 MED ORDER — MAGNESIUM SULFATE 2 GM/50ML IV SOLN
2.0000 g | Freq: Once | INTRAVENOUS | Status: AC
  Administered 2021-10-25: 15:00:00 2 g via INTRAVENOUS
  Filled 2021-10-25: qty 50

## 2021-10-25 MED ORDER — ENOXAPARIN SODIUM 40 MG/0.4ML IJ SOSY
40.0000 mg | PREFILLED_SYRINGE | INTRAMUSCULAR | Status: DC
  Administered 2021-10-25 – 2021-11-04 (×8): 40 mg via SUBCUTANEOUS
  Filled 2021-10-25 (×8): qty 0.4

## 2021-10-25 NOTE — Progress Notes (Signed)
OT Cancellation Note  Patient Details Name: Dylan Weaver MRN: [id number] DOB: 03-08-1966   Cancelled Treatment:    Reason Eval/Treat Not Completed: Fatigue/lethargy limiting ability to participate. Pt sleeping heavily (PT reported that pt had difficulty keeping eyes opened during eval). OT will follow up next available time  Gal[facility] [place] 10/25/2021, 12:17 PM

## 2021-10-25 NOTE — Progress Notes (Signed)
Ok to change SQ heparin to Lovenox 40mg  for DVT prophylaxis.  , PharmD, BCIDP, AAHIVP, CPP Infectious Disease Pharmacist 10/25/2021 10:15 AM

## 2021-10-25 NOTE — Progress Notes (Signed)
PHARMACY - PHYSICIAN COMMUNICATION CRITICAL VALUE ALERT - BLOOD CULTURE IDENTIFICATION (BCID)  BCID called in.   Aerobic bottle only: Staph epidermidis with MecA/C resistance (pending sensitivities) and Staph hominis.  Anaerobic bottle: Enterococcus raffinosus   Name of physician (or Provider) Contacted: Dr. Sunnie Nielsen  Changes to prescribed antibiotics required: None - pending sensitivities  Filbert Schilder, PharmD PGY1 Pharmacy Resident 10/25/2021  8:11 AM  Please check [email or web address] for unit-specific pharmacy phone numbers.

## 2021-10-25 NOTE — Evaluation (Signed)
Physical Therapy Evaluation Patient Details Name: Dylan Weaver MRN: [id number] DOB: Feb 28, 1966 Today's Date: 10/25/2021  History of Present Illness  56 year old male presented to the ER 10/22/21 after being found down on the floor. Had been unable to get off the floor x 4 days when housekeeper found him. +incontinence of urine and feces with irritant contact dermatitis over bil anterior, medial thighs and perineum; sepsis; +CIWA  PMH chronic alcohol abuse, alcoholic cirrhosis, malnutrition,  Clinical Impression   Pt admitted secondary to problem above with deficits below. PTA patient was living alone and reports he got down on the floor and then couldn't get back up. He remained on the floor x 4 days. Pt currently requires 2 person assist for transfer OOB and unable to ambulate. He was incontinent of bowels. Patient currently unsafe to consider return home.  Anticipate patient will benefit from PT to address problems listed below.Will continue to follow acutely to maximize functional mobility independence and safety.          Recommendations for follow up therapy are one component of a multi-disciplinary discharge planning process, led by the attending physician.  Recommendations may be updated based on patient status, additional functional criteria and insurance authorization.  Follow Up Recommendations Skilled nursing-short term rehab (<3 hours/day)    Assistance Recommended at Discharge Frequent or constant Supervision/Assistance  Patient can return home with the following  Two people to help with walking and/or transfers;A lot of help with bathing/dressing/bathroom;Assistance with cooking/housework;Direct supervision/assist for medications management;Direct supervision/assist for financial management;Help with stairs or ramp for entrance (pt reports 15 steps to enter)    Equipment Recommendations None recommended by PT  Recommendations for Other Services  OT consult     Functional Status Assessment Patient has had a recent decline in their functional status and demonstrates the ability to make significant improvements in function in a reasonable and predictable amount of time.     Precautions / Restrictions Precautions Precautions: Fall;Other (comment) Precaution Comments: incontinent of B/B Restrictions Weight Bearing Restrictions: No      Mobility  Bed Mobility Overal bed mobility: Needs Assistance Bed Mobility: Supine to Sit, Sit to Supine     Supine to sit: Min assist, HOB elevated Sit to supine: Min assist   General bed mobility comments: slow with incr time and cues due to lethargy    Transfers Overall transfer level: Needs assistance Equipment used: 1 person hand held assist Transfers: Sit to/from Stand Sit to Stand: Mod assist           General transfer comment: on first attempt, achieved near standing and returned to sit; second attempt achieved standing with posterior bias; then incontinent of bowels and returned to bed for clean up    Ambulation/Gait               General Gait Details: unable due to incontinence and will need +2 assist  Stairs            Wheelchair Mobility    Modified Rankin (Stroke Patients Only)       Balance Overall balance assessment: Needs assistance Sitting-balance support: Bilateral upper extremity supported, Feet supported Sitting balance-Leahy Scale: Poor Sitting balance - Comments: leaning to his right   Standing balance support: Bilateral upper extremity supported Standing balance-Leahy Scale: Poor Standing balance comment: posterior lean in standing holding bedrail and +1 HHA  Pertinent Vitals/Pain Pain Assessment Pain Assessment: Faces Faces Pain Scale: Hurts a little bit Pain Location: legs and buttocks Pain Descriptors / Indicators: Sore Pain Intervention(s): Limited activity within patient's tolerance, Monitored during  session    Home Living Family/patient expects to be discharged to:: Private residence Living Arrangements: Alone   Type of Home: House Home Access: Stairs to enter Entrance Stairs-Rails: Doctor, general practice of Steps: 15   Home Layout: One level Home Equipment: Agricultural consultant (2 wheels);Cane - single point      Prior Function Prior Level of Function : Independent/Modified Independent;Driving               ADLs Comments: housekeeper (pt unable/willing to state how often)     Hand Dominance   Dominant Hand: Right    Extremity/Trunk Assessment   Upper Extremity Assessment Upper Extremity Assessment: Defer to OT evaluation    Lower Extremity Assessment Lower Extremity Assessment: Generalized weakness    Cervical / Trunk Assessment Cervical / Trunk Assessment: Kyphotic  Communication   Communication: No difficulties  Cognition Arousal/Alertness: Lethargic Behavior During Therapy: Flat affect Overall Cognitive Status: No family/caregiver present to determine baseline cognitive functioning                                 General Comments: pt lethargic and difficult to assess due to frequent drifting off to sleep; provided home set-up with multiple repeated questions        General Comments General comments (skin integrity, edema, etc.): Pt denied knowing he had to hav e a BM or that he had already had it    Exercises     Assessment/Plan    PT Assessment Patient needs continued PT services  PT Problem List Decreased strength;Decreased activity tolerance;Decreased balance;Decreased mobility;Decreased cognition;Decreased knowledge of use of DME;Decreased safety awareness;Pain       PT Treatment Interventions DME instruction;Gait training;Stair training;Functional mobility training;Therapeutic activities;Therapeutic exercise;Balance training;Cognitive remediation;Patient/family education    PT Goals (Current goals can be found in the  Care Plan section)  Acute Rehab PT Goals Patient Stated Goal: unable to state; agrees he needs to get stronger PT Goal Formulation: With patient Time For Goal Achievement: 11/08/21 Potential to Achieve Goals: Fair    Frequency Min 2X/week     Co-evaluation               AM-PAC PT "6 Clicks" Mobility  Outcome Measure Help needed turning from your back to your side while in a flat bed without using bedrails?: A Little Help needed moving from lying on your back to sitting on the side of a flat bed without using bedrails?: A Lot Help needed moving to and from a bed to a chair (including a wheelchair)?: A Lot Help needed standing up from a chair using your arms (e.g., wheelchair or bedside chair)?: A Lot Help needed to walk in hospital room?: Total Help needed climbing 3-5 steps with a railing? : Total 6 Click Score: 11    End of Session Equipment Utilized During Treatment: Gait belt Activity Tolerance: Treatment limited secondary to medical complications (Comment) (imbalance and incontinence) Patient left: in bed;with call bell/phone within reach;with bed alarm set Nurse Communication: Mobility status;Other (comment) (needs assist to clean up) PT Visit Diagnosis: Unsteadiness on feet (R26.81);Difficulty in walking, not elsewhere classified (R26.2)    Time: 1610-9604 PT Time Calculation (min) (ACUTE ONLY): 27 min   Charges:   PT Evaluation $PT Eval  Moderate Complexity: 1 Mod PT Treatments $Therapeutic Activity: 8-22 mins         Jerolyn Center, PT Acute Rehabilitation Services  Pager [phone] Office [phone]   Zena Amos 10/25/2021, 10:41 AM

## 2021-10-25 NOTE — Progress Notes (Addendum)
10:59am-[facility] notification ID is: [phone]  CSW left voicemail for April with the [facility] to determine if patient is service connected (or eligible for SNF).  1pm-CSW received return call from April. She stated patient is only 10% service connected and therefore not likely to be approved for SNF. CSW will fax referral to confirm.   Patient's [facility] PCP: [name]   Social Worker: Hedda Slade (pager [phone] office [phone] ex [phone])  Dylan Weaver, MSW, MHA

## 2021-10-25 NOTE — Consult Note (Signed)
[facility]    Date of Admission:  10/22/2021     Total days of antibiotics 4  Vancomycin   Ceftriaxone         Reason for Consult: bacteremia     Referring Provider: [facility]  Primary Care Provider: [name]   Assessment: Dylan Weaver is a 56 y.o. male admitted after falling at home and being on the ground x 4 days with dehydration, rhabdomyolysis, and found to have bacteremia with staphylococcus hominis and enterococcus raffinosus. No localizing symptoms or sources of infection; skin appears much better after topical application to irritant dermatitis. Likely both represent skin contaminants; will narrow to ampicillin-sulbactam and stop vancomycin. Probably can stop antibiotics soon.   COVID +: no symptomatic disease and poor overall timeline given for symptoms. No treatment indicated at this time. Continue standard isolation precautions.   Irritant Dermatitis- his thighs appear much improved compared to when he came in with topical Gerhardt's Butt Balm. Would continue wound treatment.    Plan: Narrow to Unasyn for now. Probably can transition to oral amox if he is overall ready for D/C back home to err on the side of caution for the enterococcus.  Will see how he looks tomorrow.     Principal Problem:   Cellulitis Active Problems:   Alcohol use disorder, severe, dependence (HCC)   Alcohol withdrawal (HCC)   Elevated LFTs   Tobacco abuse   Irritant contact dermatitis due to incontinence of both feces and urine   Rhabdomyolysis   Sepsis with acute organ dysfunction without septic shock (HCC)   Pressure injury of skin    bacitracin   Topical BID   Chlorhexidine Gluconate Cloth  6 each Topical Daily   enoxaparin (LOVENOX) injection  40 mg Subcutaneous Q24H   feeding supplement  237 mL Oral BID BM   folic acid  1 mg Oral Daily   Gerhardt's butt cream   Topical TID   lactulose  10 g Oral Daily   magnesium  oxide  400 mg Oral BID   multivitamin with minerals  1 tablet Oral Daily   nicotine  21 mg Transdermal Daily   nutrition supplement (JUVEN)  1 packet Oral BID BM   nystatin cream   Topical BID    HPI: Dylan Weaver is a 56 y.o. male admitted from home after being found down at home covered in personal excrement. Was unable to get off the floor and had been sleeping there for 4 days when EMS came to evaluate him after housekeeper found him.   Found to have irritant dermatitis on thighs/lower abdomen from urine/stool exposure. Lactic acid 4.4. CK 1490. WBC 13.3. No signs of SIRS in ER.  Found to be COVID (+) with no respiratory symptoms. He does report fecal incontinence / diarrhea of an undetermined time frame. No abdominal pain or cramping associated with this. From what he can tell he has not had any fevers but history is a little limited by the patient.   During work up found to have staphylococcus hominis in the blood along with enterococcus raffinosus in 1 site. He has multiple flaking wounds to lower and upper extremities, nasal bridge and right sided face/cheek. He has been on vancomycin + ceftriaxone since admission on 1/20. He states he feels 50% better than when he came in.  CK is trending down. Thigh rash he thinks is better.    Review of Systems: Review of  Systems  Constitutional:  Negative for chills, fever, malaise/fatigue and weight loss.  HENT:  Negative for sore throat.   Respiratory:  Negative for cough and sputum production.   Cardiovascular:  Negative for chest pain and leg swelling.  Gastrointestinal:  Positive for diarrhea. Negative for abdominal pain and vomiting.  Genitourinary:  Negative for dysuria and flank pain.  Musculoskeletal:  Positive for falls. Negative for joint pain, myalgias and neck pain.  Skin:  Negative for rash.  Neurological:  Negative for dizziness, tingling and headaches.  Psychiatric/Behavioral:  Positive for substance abuse (ETOH  use). Negative for depression. The patient is not nervous/anxious and does not have insomnia.    Past Medical History:  Diagnosis Date   Acute liver failure with hepatic coma (HCC) 06/04/2021   ETOH abuse     Social History   Tobacco Use   Smoking status: Every Day    Packs/day: 0.50    Types: Cigarettes   Smokeless tobacco: Never  Vaping Use   Vaping Use: Never used  Substance Use Topics   Alcohol use: Yes    Comment: 12 pack a day beer   Drug use: Never    Family History  Problem Relation Age of Onset   Hypertension Other    No Known Allergies  OBJECTIVE: Blood pressure 128/73, pulse 98, temperature (!) 97.5 F (36.4 C), temperature source Oral, resp. rate 18, weight 79.4 kg, SpO2 100 %.  Physical Exam Vitals and nursing note reviewed.  Constitutional:      Appearance: He is well-developed.     Comments: Seated comfortably in chair during visit.   HENT:     Head:     Comments: Scabbed / flaking skin overlying right cheek and nose from abrasion. Laceration sutured across nasal bridge.     Mouth/Throat:     Mouth: Mucous membranes are dry.     Dentition: Normal dentition. No dental abscesses.     Pharynx: No oropharyngeal exudate.  Eyes:     Conjunctiva/sclera: Conjunctivae normal.  Cardiovascular:     Rate and Rhythm: Normal rate and regular rhythm.     Heart sounds: Normal heart sounds.  Pulmonary:     Effort: Pulmonary effort is normal.     Breath sounds: Normal breath sounds.  Abdominal:     General: Bowel sounds are normal. There is no distension.     Palpations: Abdomen is soft.     Tenderness: There is no abdominal tenderness.  Musculoskeletal:        General: Swelling: left wrist and elbow appear a little swollen to me but not tender. Open wounds scattered on extremeties..  Lymphadenopathy:     Cervical: No cervical adenopathy.  Skin:    General: Skin is warm and dry.     Findings: No rash.  Neurological:     Mental Status: He is alert and  oriented to person, place, and time.  Psychiatric:        Judgment: Judgment normal.     Comments: In good spirits today and engaged in care discussion.     Lab Results Lab Results  Component Value Date   WBC 4.5 10/25/2021   HGB 12.2 (L) 10/25/2021   HCT 36.4 (L) 10/25/2021   MCV 93.8 10/25/2021   PLT 104 (L) 10/25/2021    Lab Results  Component Value Date   CREATININE 0.53 (L) 10/25/2021   BUN 9 10/25/2021   NA 138 10/25/2021   K 3.7 10/25/2021   CL 109 10/25/2021  CO2 21 (L) 10/25/2021    Lab Results  Component Value Date   ALT 53 (H) 10/25/2021   AST 93 (H) 10/25/2021   ALKPHOS 128 (H) 10/25/2021   BILITOT 1.4 (H) 10/25/2021     Microbiology: Recent Results (from the past 240 hour(s))  Urine Culture     Status: None   Collection Time: 10/22/21 12:26 AM   Specimen: In/Out Cath Urine  Result Value Ref Range Status   Specimen Description IN/OUT CATH URINE  Final   Special Requests NONE  Final   Culture   Final    NO GROWTH Performed at [facility], [address] [place], [place] [address]    Report Status 10/24/2021 FINAL  Final  Blood Culture (routine x 2)     Status: None (Preliminary result)   Collection Time: 10/22/21  1:03 AM   Specimen: BLOOD  Result Value Ref Range Status   Specimen Description BLOOD LEFT ANTECUBITAL  Final   Special Requests AEROBIC BOTTLE ONLY Blood Culture adequate volume  Final   Culture   Final    NO GROWTH 3 DAYS Performed at [facility], [address] [place], [place] [address]    Report Status PENDING  Incomplete  Resp Panel by RT-PCR (Flu A&B, Covid) Nasopharyngeal Swab     Status: Abnormal   Collection Time: 10/22/21  1:04 AM   Specimen: Nasopharyngeal Swab; Nasopharyngeal(NP) swabs in vial transport medium  Result Value Ref Range Status   SARS Coronavirus 2 by RT PCR POSITIVE (A) NEGATIVE Final    Comment: (NOTE) SARS-CoV-2 target nucleic acids are DETECTED.  The SARS-CoV-2 RNA is generally  detectable in upper respiratory specimens during the acute phase of infection. Positive results are indicative of the presence of the identified virus, but do not rule out bacterial infection or co-infection with other pathogens not detected by the test. Clinical correlation with patient history and other diagnostic information is necessary to determine patient infection status. The expected result is Negative.  Fact Sheet for Patients: BloggerCourse.comhttps://www.fda.gov/media/152166/download  Fact Sheet for Healthcare Providers: SeriousBroker.ithttps://www.fda.gov/media/152162/download  This test is not yet approved or cleared by the [place] FDA and  has been authorized for detection and/or diagnosis of SARS-CoV-2 by FDA under an Emergency Use Authorization (EUA).  This EUA will remain in effect (meaning this test can be used) for the duration of  the COVID-19 declaration under Section 564(b)(1) of the A ct, 21 U.S.C. section 360bbb-3(b)(1), unless the authorization is terminated or revoked sooner.     Influenza A by PCR NEGATIVE NEGATIVE Final   Influenza B by PCR NEGATIVE NEGATIVE Final    Comment: (NOTE) The Xpert Xpress SARS-CoV-2/FLU/RSV plus assay is intended as an aid in the diagnosis of influenza from Nasopharyngeal swab specimens and should not be used as a sole basis for treatment. Nasal washings and aspirates are unacceptable for Xpert Xpress SARS-CoV-2/FLU/RSV testing.  Fact Sheet for Patients: BloggerCourse.comhttps://www.fda.gov/media/152166/download  Fact Sheet for Healthcare Providers: SeriousBroker.ithttps://www.fda.gov/media/152162/download  This test is not yet approved or cleared by the [place] FDA and has been authorized for detection and/or diagnosis of SARS-CoV-2 by FDA under an Emergency Use Authorization (EUA). This EUA will remain in effect (meaning this test can be used) for the duration of the COVID-19 declaration under Section 564(b)(1) of the Act, 21 U.S.C. section 360bbb-3(b)(1), unless the  authorization is terminated or revoked.  Performed at [facility], [address] [place], [place] [address]   Blood Culture (routine x  2)     Status: Abnormal   Collection Time: 10/22/21  3:00 AM   Specimen: BLOOD  Result Value Ref Range Status   Specimen Description BLOOD RIGHT ANTECUBITAL  Final   Special Requests   Final    BOTTLES DRAWN AEROBIC AND ANAEROBIC Blood Culture adequate volume   Culture  Setup Time   Final    GRAM POSITIVE COCCI IN BOTH AEROBIC AND ANAEROBIC BOTTLES CRITICAL RESULT CALLED TO, READ BACK BY AND VERIFIED WITH: B MANCHERIL,PHARMD@0625  10/23/21 MK    Culture (A)  Final    STAPHYLOCOCCUS HOMINIS ENTEROCOCCUS RAFFINOSUS CRITICAL RESULT CALLED TO, READ BACK BY AND VERIFIED WITH: PHARMD A UELAND [phone] AT 749 AM BY CM STAPHYLOCOCCUS EPIDERMIDIS THE SIGNIFICANCE OF ISOLATING THIS ORGANISM FROM A SINGLE SET OF BLOOD CULTURES WHEN MULTIPLE SETS ARE DRAWN IS UNCERTAIN. PLEASE NOTIFY THE MICROBIOLOGY DEPARTMENT WITHIN ONE WEEK IF SPECIATION AND SENSITIVITIES ARE REQUIRED. Performed at [facility], [address] [place], [place] [address]    Report Status 10/25/2021 FINAL  Final   Organism ID, Bacteria ENTEROCOCCUS RAFFINOSUS  Final      Susceptibility   Enterococcus raffinosus - MIC*    AMPICILLIN 8 SENSITIVE Sensitive     VANCOMYCIN 1 SENSITIVE Sensitive     GENTAMICIN SYNERGY SENSITIVE Sensitive     * ENTEROCOCCUS RAFFINOSUS  Blood Culture ID Panel (Reflexed)     Status: Abnormal   Collection Time: 10/22/21  3:00 AM  Result Value Ref Range Status   Enterococcus faecalis NOT DETECTED NOT DETECTED Final   Enterococcus Faecium NOT DETECTED NOT DETECTED Final   Listeria monocytogenes NOT DETECTED NOT DETECTED Final   Staphylococcus species DETECTED (A) NOT DETECTED Final    Comment: CRITICAL RESULT CALLED TO, READ BACK BY AND VERIFIED WITH: B MANCHERIL,PHARMD@0625  10/23/21 MK    Staphylococcus aureus (BCID) NOT DETECTED NOT DETECTED Final    Staphylococcus epidermidis DETECTED (A) NOT DETECTED Final    Comment: Methicillin (oxacillin) resistant coagulase negative staphylococcus. Possible blood culture contaminant (unless isolated from more than one blood culture draw or clinical case suggests pathogenicity). No antibiotic treatment is indicated for blood  culture contaminants. CRITICAL RESULT CALLED TO, READ BACK BY AND VERIFIED WITH: B MANCHERIL,PHARMD@0625  10/23/21 MK    Staphylococcus lugdunensis NOT DETECTED NOT DETECTED Final   Streptococcus species NOT DETECTED NOT DETECTED Final   Streptococcus agalactiae NOT DETECTED NOT DETECTED Final   Streptococcus pneumoniae NOT DETECTED NOT DETECTED Final   Streptococcus pyogenes NOT DETECTED NOT DETECTED Final   A.calcoaceticus-baumannii NOT DETECTED NOT DETECTED Final   Bacteroides fragilis NOT DETECTED NOT DETECTED Final   Enterobacterales NOT DETECTED NOT DETECTED Final   Enterobacter cloacae complex NOT DETECTED NOT DETECTED Final   Escherichia coli NOT DETECTED NOT DETECTED Final   Klebsiella aerogenes NOT DETECTED NOT DETECTED Final   Klebsiella oxytoca NOT DETECTED NOT DETECTED Final   Klebsiella pneumoniae NOT DETECTED NOT DETECTED Final   Proteus species NOT DETECTED NOT DETECTED Final   Salmonella species NOT DETECTED NOT DETECTED Final   Serratia marcescens NOT DETECTED NOT DETECTED Final   Haemophilus influenzae NOT DETECTED NOT DETECTED Final   Neisseria meningitidis NOT DETECTED NOT DETECTED Final   Pseudomonas aeruginosa NOT DETECTED NOT DETECTED Final   Stenotrophomonas maltophilia NOT DETECTED NOT DETECTED Final   Candida albicans NOT DETECTED NOT DETECTED Final   Candida auris NOT DETECTED NOT DETECTED Final   Candida glabrata NOT DETECTED NOT DETECTED Final   Candida krusei NOT DETECTED NOT DETECTED Final  Candida parapsilosis NOT DETECTED NOT DETECTED Final   Candida tropicalis NOT DETECTED NOT DETECTED Final   Cryptococcus neoformans/gattii NOT  DETECTED NOT DETECTED Final   Methicillin resistance mecA/C DETECTED (A) NOT DETECTED Final    Comment: CRITICAL RESULT CALLED TO, READ BACK BY AND VERIFIED WITH: B MANCHERIL,PHARMD@0625  10/23/21 MK Performed at [facility], [address], [place], [place] [address]     Rexene Alberts, MSN, NP-C [facility] Pager: [phone]  10/25/2021 1:55 PM

## 2021-10-25 NOTE — Progress Notes (Signed)
PROGRESS NOTE    Dylan Weaver  [id number] DOB: [birth year] DOA: 10/22/2021 PCP: [facility]   Brief Narrative: 56 year old past medical history significant for chronic alcohol abuse, alcoholic cirrhosis, malnutrition presented to ER after being found down on the floor.  Patient stated that he has been sleeping on the floor for the last 4 days on his back.  He has been unable to get off of the floor.  He has been urinating and defecating on himself.  His housekeeper came over  and called EMS.  Patient presented with lactic acid of 4.4, COVID-positive, total CK1490, white blood cell 13.  IV fluids lactate decreased to 2.3.  Patient was admitted with cellulitis, rhabdomyolysis and sepsis with acute organ dysfunction.  CT abdomen and pelvis was negative for perineal abscess.    Assessment & Plan:   Principal Problem:   Cellulitis Active Problems:   Alcohol use disorder, severe, dependence (HCC)   Alcohol withdrawal (HCC)   Elevated LFTs   Tobacco abuse   Irritant contact dermatitis due to incontinence of both feces and urine   Rhabdomyolysis   Sepsis with acute organ dysfunction without septic shock (HCC)   Pressure injury of skin   1-Cellulitis of Bilateral thigh and Pubic Area;  -2/4 Blood culture positive for Stap epidermidis, and Enterococcus Raffinosus.  -Continue with vancomycin and ceftriaxone.  -Plan to repeat Blood cultures today.  -ID consulted.  -Continue with IV antibiotics.  -Cellulitis improving   2-Alcohol use disorder, alcohol withdrawal: Continue with CIWA protocol. Continue with thiamine and folic acid. He is more alert, oriented times 2.   Transaminases: Secondary to alcohol abuse and history of cirrhosis -Follow trend/.  -LFT trending down.   Tobacco abuse: We will need counseling  Sepsis with acute organ dysfunction without septic shock: secondary to cellulitis.  Presented with hypothermia, Tachypnea RR 26, Tachycardia ,  leukocytosis.  Sepsis POA>  Treated with fluids, and antibiotics.   Acute metabolic encephalopathy: Secondary to alcohol withdrawal and infectious process Ammonia 42, started lactulose daily.   Irritant contact dermatitis due to incontinence of both feces and urine: Continue with nystatin cream Improving.   Rhabdomyolysis: CK 1490--- decreased to 147 after hydration Treated with IV fluids  Hyponatremia: Treated with IV fluids resolved  Hypokalemia: Replaced.   Hypomagnesemia: Mg 1.5 today, replete IV today.   Thrombocytopenia: Secondary to cirrhosis  COVID-positive: Chest x-ray negative, no hypoxic, Unable to provide significant history Monotor                                 See Wound documentation Below.      Pressure Injury 10/22/21 Toe (Comment  which one) Anterior;Left Unstageable - Full thickness tissue loss in which the base of the injury is covered by slough (yellow, tan, gray, green or brown) and/or eschar (tan, brown or black) in the wound bed. Right  (Active)  10/22/21 1640  Location: Toe (Comment  which one)  Location Orientation: Anterior;Left  Staging: Unstageable - Full thickness tissue loss in which the base of the injury is covered by slough (yellow, tan, gray, green or brown) and/or eschar (tan, brown or black) in the wound bed.  Wound Description (Comments): Right great toe Black wound bed  Present on Admission: Yes     Pressure Injury 10/22/21 Toe (Comment  which one) Anterior;Left Unstageable - Full thickness tissue loss in which the base of the injury is covered by slough (yellow, tan,  gray, green or brown) and/or eschar (tan, brown or black) in the wound bed. left s (Active)  10/22/21 1640  Location: Toe (Comment  which one)  Location Orientation: Anterior;Left  Staging: Unstageable - Full thickness tissue loss in which the base of the injury is covered by slough (yellow, tan, gray, green or brown) and/or eschar (tan, brown or black) in  the wound bed.  Wound Description (Comments): left second toe, black  Present on Admission: Yes     Pressure Injury 10/22/21 Toe (Comment  which one) Anterior;Right Unstageable - Full thickness tissue loss in which the base of the injury is covered by slough (yellow, tan, gray, green or brown) and/or eschar (tan, brown or black) in the wound bed. right (Active)  10/22/21 1600  Location: Toe (Comment  which one)  Location Orientation: Anterior;Right  Staging: Unstageable - Full thickness tissue loss in which the base of the injury is covered by slough (yellow, tan, gray, green or brown) and/or eschar (tan, brown or black) in the wound bed.  Wound Description (Comments): right second toe, black  Present on Admission: Yes     Pressure Injury 10/22/21 Penis Distal Stage 2 -  Partial thickness loss of dermis presenting as a shallow open injury with a red, pink wound bed without slough. (Active)  10/22/21 1640  Location: Penis  Location Orientation: Distal  Staging: Stage 2 -  Partial thickness loss of dermis presenting as a shallow open injury with a red, pink wound bed without slough.  Wound Description (Comments):   Present on Admission:      Nutrition Problem: Severe Malnutrition Etiology: social / environmental circumstances (EtOH abuse)    Signs/Symptoms: severe muscle depletion, severe fat depletion    Interventions: Ensure Enlive (each supplement provides 350kcal and 20 grams of protein), MVI  Estimated body mass index is 23.73 kg/m as calculated from the following:   Height as of 09/20/21: 6' (1.829 m).   Weight as of this encounter: 79.4 kg.   DVT prophylaxis: Heparin  Code Status: Full code Family Communication: Aunt over phone updated.   Disposition Plan:  Status is: Inpatient  Remains inpatient appropriate because: encephalopathy, alcohol withdrawal.   Consultants:  None  Procedures:  None  Antimicrobials:    Subjective: He was alert this am. Denies  pain.  Redness LE thigh improved.   Objective: Vitals:   10/24/21 1100 10/24/21 1159 10/24/21 1604 10/24/21 1924  BP:  98/63 97/65 116/74  Pulse:  80 79 93  Resp:  20 20 16   Temp:  97.6 F (36.4 C) (!) 97.3 F (36.3 C) 98.3 F (36.8 C)  TempSrc:  Oral Oral Axillary  SpO2:  95% 96% 98%  Weight: 79.4 kg       Intake/Output Summary (Last 24 hours) at 10/25/2021 0802 Last data filed at 10/25/2021 0600 Gross per 24 hour  Intake 1752.78 ml  Output 800 ml  Net 952.78 ml    Filed Weights   10/24/21 1100  Weight: 79.4 kg    Examination:  General exam: chronic ill appearing.  Respiratory system: CTA Cardiovascular system: S 1, S 2 RRR Gastrointestinal system:BS present, soft, nt Central nervous system: alert,answer questions.  Extremities: no edema Skin: multiples bruise on his faces. BL thigh with redness, abrasions.    Data Reviewed: I have personally reviewed following labs and imaging studies  CBC: Recent Labs  Lab 10/22/21 0103 10/23/21 0049 10/24/21 0851 10/25/21 0210  WBC 13.3* 13.2* 3.8* 4.5  NEUTROABS 10.7* 10.5*  --   --  HGB 16.7 11.6* 13.0 12.2*  HCT 51.9 35.4* 40.3 36.4*  MCV 99.4 96.2 96.9 93.8  PLT 85* 110* 106* 104*    Basic Metabolic Panel: Recent Labs  Lab 10/22/21 0103 10/22/21 0731 10/23/21 0049 10/24/21 0851 10/25/21 0210  NA 137 134*  --  142 138  K 3.9 3.1* 3.9 3.5 3.7  CL 98 100  --  109 109  CO2 20* 23  --  23 21*  GLUCOSE 112* 112*  --  79 78  BUN 16 14  --  6 9  CREATININE 1.06 0.74  --  0.53* 0.53*  CALCIUM 9.4 8.3*  --  8.3* 8.0*  MG  --   --  1.8 1.6*  --     GFR: Estimated Creatinine Clearance: 114.5 mL/min (A) (by C-G formula based on SCr of 0.53 mg/dL (L)). Liver Function Tests: Recent Labs  Lab 10/22/21 0103 10/22/21 0731 10/25/21 0210  AST 189* 120* 93*  ALT 98* 67* 53*  ALKPHOS 172* 123 128*  BILITOT 1.9* 1.0 1.4*  PROT 9.7* 6.6 5.9*  ALBUMIN 2.9* 2.0* 1.8*    No results for input(s): LIPASE,  AMYLASE in the last 168 hours. Recent Labs  Lab 10/24/21 1751  AMMONIA 42*   Coagulation Profile: Recent Labs  Lab 10/22/21 0334  INR 1.0    Cardiac Enzymes: Recent Labs  Lab 10/22/21 0103 10/22/21 1812 10/23/21 0049 10/24/21 0047  CKTOTAL 1,490* 934* 855* 347    BNP (last 3 results) No results for input(s): PROBNP in the last 8760 hours. HbA1C: No results for input(s): HGBA1C in the last 72 hours. CBG: No results for input(s): GLUCAP in the last 168 hours. Lipid Profile: No results for input(s): CHOL, HDL, LDLCALC, TRIG, CHOLHDL, LDLDIRECT in the last 72 hours. Thyroid Function Tests: No results for input(s): TSH, T4TOTAL, FREET4, T3FREE, THYROIDAB in the last 72 hours. Anemia Panel: No results for input(s): VITAMINB12, FOLATE, FERRITIN, TIBC, IRON, RETICCTPCT in the last 72 hours. Sepsis Labs: Recent Labs  Lab 10/22/21 0104 10/22/21 0300 10/23/21 0823 10/23/21 1140  LATICACIDVEN 4.4* 2.3* 1.4 0.9     Recent Results (from the past 240 hour(s))  Urine Culture     Status: None   Collection Time: 10/22/21 12:26 AM   Specimen: In/Out Cath Urine  Result Value Ref Range Status   Specimen Description IN/OUT CATH URINE  Final   Special Requests NONE  Final   Culture   Final    NO GROWTH Performed at [facility], [place] [address] [place], [place] [address]    Report Status 10/24/2021 FINAL  Final  Blood Culture (routine x 2)     Status: None (Preliminary result)   Collection Time: 10/22/21  1:03 AM   Specimen: BLOOD  Result Value Ref Range Status   Specimen Description BLOOD LEFT ANTECUBITAL  Final   Special Requests AEROBIC BOTTLE ONLY Blood Culture adequate volume  Final   Culture   Final    NO GROWTH 3 DAYS Performed at [facility] [address], [place], [place] [address]    Report Status PENDING  Incomplete  Resp Panel by RT-PCR (Flu A&B, Covid) Nasopharyngeal Swab     Status: Abnormal   Collection Time: 10/22/21  1:04 AM   Specimen:  Nasopharyngeal Swab; Nasopharyngeal(NP) swabs in vial transport medium  Result Value Ref Range Status   SARS Coronavirus 2 by RT PCR POSITIVE (A) NEGATIVE Final    Comment: (NOTE) SARS-CoV-2 target nucleic acids are DETECTED.  The SARS-CoV-2 RNA is generally detectable in upper respiratory specimens during the acute phase of infection. Positive results are indicative of the presence of the identified virus, but do not rule out bacterial infection or co-infection with other pathogens not detected by the test. Clinical correlation with patient history and other diagnostic information is necessary to determine patient infection status. The expected result is Negative.  Fact Sheet for Patients: EntrepreneurPulse.com.au  Fact Sheet for Healthcare Providers: IncredibleEmployment.be  This test is not yet approved or cleared by the [place] FDA and  has been authorized for detection and/or diagnosis of SARS-CoV-2 by FDA under an Emergency Use Authorization (EUA).  This EUA will remain in effect (meaning this test can be used) for the duration of  the COVID-19 declaration under Section 564(b)(1) of the A ct, 21 U.S.C. section 360bbb-3(b)(1), unless the authorization is terminated or revoked sooner.     Influenza A by PCR NEGATIVE NEGATIVE Final   Influenza B by PCR NEGATIVE NEGATIVE Final    Comment: (NOTE) The Xpert Xpress SARS-CoV-2/FLU/RSV plus assay is intended as an aid in the diagnosis of influenza from Nasopharyngeal swab specimens and should not be used as a sole basis for treatment. Nasal washings and aspirates are unacceptable for Xpert Xpress SARS-CoV-2/FLU/RSV testing.  Fact Sheet for Patients: EntrepreneurPulse.com.au  Fact Sheet for Healthcare Providers: IncredibleEmployment.be  This test is not yet approved or cleared by the [place] FDA and has been authorized for detection and/or  diagnosis of SARS-CoV-2 by FDA under an Emergency Use Authorization (EUA). This EUA will remain in effect (meaning this test can be used) for the duration of the COVID-19 declaration under Section 564(b)(1) of the Act, 21 U.S.C. section 360bbb-3(b)(1), unless the authorization is terminated or revoked.  Performed at [facility], [place] [address], [place], [place] [address]   Blood Culture (routine x 2)     Status: Abnormal (Preliminary result)   Collection Time: 10/22/21  3:00 AM   Specimen: BLOOD  Result Value Ref Range Status   Specimen Description BLOOD RIGHT ANTECUBITAL  Final   Special Requests   Final    BOTTLES DRAWN AEROBIC AND ANAEROBIC Blood Culture adequate volume   Culture  Setup Time   Final    GRAM POSITIVE COCCI IN BOTH AEROBIC AND ANAEROBIC BOTTLES CRITICAL RESULT CALLED TO, READ BACK BY AND VERIFIED WITH: B MANCHERIL,PHARMD@0625  10/23/21 [place]    Culture (A)  Final    STAPHYLOCOCCUS HOMINIS THE SIGNIFICANCE OF ISOLATING THIS ORGANISM FROM A SINGLE SET OF BLOOD CULTURES WHEN MULTIPLE SETS ARE DRAWN IS UNCERTAIN. PLEASE NOTIFY THE MICROBIOLOGY DEPARTMENT WITHIN ONE WEEK IF SPECIATION AND SENSITIVITIES ARE REQUIRED. ENTEROCOCCUS RAFFINOSUS CRITICAL RESULT CALLED TO, READ BACK BY AND VERIFIED WITH: PHARMD A UELAND [id number] AT [id number] AM BY CM CULTURE REINCUBATED FOR BETTER GROWTH Performed at [facility], [place] [address] [place], [place] [address]    Report Status PENDING  Incomplete   Organism ID, Bacteria ENTEROCOCCUS RAFFINOSUS  Final      Susceptibility   Enterococcus raffinosus - MIC*    AMPICILLIN 8 SENSITIVE Sensitive     VANCOMYCIN 1 SENSITIVE Sensitive     GENTAMICIN SYNERGY SENSITIVE Sensitive     * ENTEROCOCCUS RAFFINOSUS  Blood Culture ID Panel (Reflexed)     Status: Abnormal   Collection Time: 10/22/21  3:00 AM  Result Value Ref Range Status   Enterococcus faecalis NOT DETECTED NOT DETECTED Final   Enterococcus Faecium NOT DETECTED NOT DETECTED  Final  Listeria monocytogenes NOT DETECTED NOT DETECTED Final   Staphylococcus species DETECTED (A) NOT DETECTED Final    Comment: CRITICAL RESULT CALLED TO, READ BACK BY AND VERIFIED WITH: B MANCHERIL,PHARMD@0625  10/23/21 [place]    Staphylococcus aureus (BCID) NOT DETECTED NOT DETECTED Final   Staphylococcus epidermidis DETECTED (A) NOT DETECTED Final    Comment: Methicillin (oxacillin) resistant coagulase negative staphylococcus. Possible blood culture contaminant (unless isolated from more than one blood culture draw or clinical case suggests pathogenicity). No antibiotic treatment is indicated for blood  culture contaminants. CRITICAL RESULT CALLED TO, READ BACK BY AND VERIFIED WITH: B MANCHERIL,PHARMD@0625  10/23/21 [place]    Staphylococcus lugdunensis NOT DETECTED NOT DETECTED Final   Streptococcus species NOT DETECTED NOT DETECTED Final   Streptococcus agalactiae NOT DETECTED NOT DETECTED Final   Streptococcus pneumoniae NOT DETECTED NOT DETECTED Final   Streptococcus pyogenes NOT DETECTED NOT DETECTED Final   A.calcoaceticus-baumannii NOT DETECTED NOT DETECTED Final   Bacteroides fragilis NOT DETECTED NOT DETECTED Final   Enterobacterales NOT DETECTED NOT DETECTED Final   Enterobacter cloacae complex NOT DETECTED NOT DETECTED Final   Escherichia coli NOT DETECTED NOT DETECTED Final   Klebsiella aerogenes NOT DETECTED NOT DETECTED Final   Klebsiella oxytoca NOT DETECTED NOT DETECTED Final   Klebsiella pneumoniae NOT DETECTED NOT DETECTED Final   Proteus species NOT DETECTED NOT DETECTED Final   Salmonella species NOT DETECTED NOT DETECTED Final   Serratia marcescens NOT DETECTED NOT DETECTED Final   Haemophilus influenzae NOT DETECTED NOT DETECTED Final   Neisseria meningitidis NOT DETECTED NOT DETECTED Final   Pseudomonas aeruginosa NOT DETECTED NOT DETECTED Final   Stenotrophomonas maltophilia NOT DETECTED NOT DETECTED Final   Candida albicans NOT DETECTED NOT DETECTED Final    Candida auris NOT DETECTED NOT DETECTED Final   Candida glabrata NOT DETECTED NOT DETECTED Final   Candida krusei NOT DETECTED NOT DETECTED Final   Candida parapsilosis NOT DETECTED NOT DETECTED Final   Candida tropicalis NOT DETECTED NOT DETECTED Final   Cryptococcus neoformans/gattii NOT DETECTED NOT DETECTED Final   Methicillin resistance mecA/C DETECTED (A) NOT DETECTED Final    Comment: CRITICAL RESULT CALLED TO, READ BACK BY AND VERIFIED WITH: B MANCHERIL,PHARMD@0625  10/23/21 [place] Performed at [facility], [address] [place], [place] [address]         Radiology Studies: No results found.      Scheduled Meds:  bacitracin   Topical BID   Chlorhexidine Gluconate Cloth  6 each Topical Daily   feeding supplement  237 mL Oral BID BM   folic acid  1 mg Oral Daily   Gerhardt's butt cream   Topical TID   heparin  5,000 Units Subcutaneous Q8H   lactulose  10 g Oral Daily   magnesium oxide  400 mg Oral BID   multivitamin with minerals  1 tablet Oral Daily   nicotine  21 mg Transdermal Daily   nutrition supplement (JUVEN)  1 packet Oral BID BM   nystatin cream   Topical BID   Continuous Infusions:  cefTRIAXone (ROCEPHIN)  IV 2 g (10/24/21 2043)   ringers 75 mL/hr at 10/24/21 1017   vancomycin 1,250 mg (10/24/21 2042)     LOS: 3 days    Time spent: 35 minutes    Aleesha Ringstad A Delsie Amador, MD [facility]   If 7PM-7AM, please contact night-coverage [email or web address]  10/25/2021, 8:02 AM

## 2021-10-25 NOTE — Plan of Care (Signed)

## 2021-10-26 DIAGNOSIS — L03119 Cellulitis of unspecified part of limb: Secondary | ICD-10-CM | POA: Diagnosis not present

## 2021-10-26 LAB — CBC
HCT: 35.1 % — ABNORMAL LOW (ref 39.0–52.0)
Hemoglobin: 11.4 g/dL — ABNORMAL LOW (ref 13.0–17.0)
MCH: 31.1 pg (ref 26.0–34.0)
MCHC: 32.5 g/dL (ref 30.0–36.0)
MCV: 95.9 fL (ref 80.0–100.0)
Platelets: 152 10*3/uL (ref 150–400)
RBC: 3.66 MIL/uL — ABNORMAL LOW (ref 4.22–5.81)
RDW: 17 % — ABNORMAL HIGH (ref 11.5–15.5)
WBC: 4.8 10*3/uL (ref 4.0–10.5)
nRBC: 0 % (ref 0.0–0.2)

## 2021-10-26 LAB — COMPREHENSIVE METABOLIC PANEL
ALT: 46 U/L — ABNORMAL HIGH (ref 0–44)
AST: 65 U/L — ABNORMAL HIGH (ref 15–41)
Albumin: 1.7 g/dL — ABNORMAL LOW (ref 3.5–5.0)
Alkaline Phosphatase: 142 U/L — ABNORMAL HIGH (ref 38–126)
Anion gap: 6 (ref 5–15)
BUN: 7 mg/dL (ref 6–20)
CO2: 24 mmol/L (ref 22–32)
Calcium: 8 mg/dL — ABNORMAL LOW (ref 8.9–10.3)
Chloride: 102 mmol/L (ref 98–111)
Creatinine, Ser: 0.59 mg/dL — ABNORMAL LOW (ref 0.61–1.24)
GFR, Estimated: >60 mL/min (ref >60)
Glucose, Bld: 97 mg/dL (ref 70–99)
Potassium: 3.2 mmol/L — ABNORMAL LOW (ref 3.5–5.1)
Sodium: 132 mmol/L — ABNORMAL LOW (ref 135–145)
Total Bilirubin: 0.6 mg/dL (ref 0.3–1.2)
Total Protein: 5.9 g/dL — ABNORMAL LOW (ref 6.5–8.1)

## 2021-10-26 LAB — MAGNESIUM: Magnesium: 1.4 mg/dL — ABNORMAL LOW (ref 1.7–2.4)

## 2021-10-26 MED ORDER — MAGNESIUM SULFATE 2 GM/50ML IV SOLN
2.0000 g | Freq: Once | INTRAVENOUS | Status: AC
  Administered 2021-10-26: 15:00:00 2 g via INTRAVENOUS
  Filled 2021-10-26: qty 50

## 2021-10-26 MED ORDER — POTASSIUM CHLORIDE CRYS ER 20 MEQ PO TBCR
40.0000 meq | EXTENDED_RELEASE_TABLET | Freq: Once | ORAL | Status: AC
  Administered 2021-10-26: 10:00:00 40 meq via ORAL
  Filled 2021-10-26: qty 2

## 2021-10-26 MED ORDER — HYDROCERIN EX CREA
TOPICAL_CREAM | Freq: Every day | CUTANEOUS | Status: DC
  Filled 2021-10-26: qty 113

## 2021-10-26 MED ORDER — POTASSIUM CHLORIDE CRYS ER 20 MEQ PO TBCR
40.0000 meq | EXTENDED_RELEASE_TABLET | Freq: Once | ORAL | Status: AC
Start: 2021-10-26 — End: 2021-10-26
  Administered 2021-10-26: 15:00:00 40 meq via ORAL
  Filled 2021-10-26: qty 2

## 2021-10-26 MED ORDER — AMOXICILLIN 500 MG PO CAPS
500.0000 mg | ORAL_CAPSULE | Freq: Three times a day (TID) | ORAL | Status: AC
  Administered 2021-10-26 – 2021-10-28 (×8): 500 mg via ORAL
  Filled 2021-10-26 (×8): qty 1

## 2021-10-26 MED ORDER — MAGNESIUM SULFATE 2 GM/50ML IV SOLN
2.0000 g | Freq: Once | INTRAVENOUS | Status: AC
  Administered 2021-10-26: 18:00:00 2 g via INTRAVENOUS
  Filled 2021-10-26: qty 50

## 2021-10-26 NOTE — NC FL2 (Signed)
[place] LEVEL OF CARE SCREENING TOOL     IDENTIFICATION  Patient Name: Dylan Weaver Birthdate: [birth year] Sex: male Admission Date (Current Location): 10/22/2021  [facility] and [place] Number:  Herbalist and Address:  [facility] [place] [address] [place], [place] [address]      Provider Number: [id number]  Attending Physician Name and Address:  Elmarie Shiley, MD  Relative Name and Phone Number:       Current Level of Care: Hospital Recommended Level of Care: [place] Prior Approval Number:    Date Approved/Denied:   PASRR Number: [id number]  Discharge Plan: SNF    Current Diagnoses: Patient Active Problem List   Diagnosis Date Noted   Enterococcal bacteremia    Pressure injury of skin 10/24/2021   Irritant contact dermatitis due to incontinence of both feces and urine 10/22/2021   Rhabdomyolysis 10/22/2021   Sepsis with acute organ dysfunction without septic shock ([place]) 10/22/2021   Hepatic steatosis 08/31/2021   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 breakdown of skin ([place])    Cellulitis 08/26/2021   Tobacco abuse 08/26/2021   Infection of right foot with necrosis 08/26/2021   Foot infection 08/26/2021   Decompensated hepatic cirrhosis ([place]) 08/25/2021   Protein-calorie malnutrition, severe 06/14/2021   Elevated LFTs 11/29/2020   Intertrigo 11/29/2020   Prolonged QT interval 11/29/2020   Malnutrition of moderate degree 09/16/2020   Alcohol withdrawal ([place]) 123456   Alcoholic hepatitis 123456   Thrombocytopenia ([place]) 09/14/2020   Normochromic normocytic anemia 09/14/2020   Alcohol use disorder, severe, dependence ([place]) 06/19/2020   Alcohol abuse with intoxication ([place]) 06/19/2020    Orientation RESPIRATION BLADDER Height & Weight     Self, Place  Normal Incontinent, External catheter Weight: 175 lb (79.4 kg) Height:     BEHAVIORAL  SYMPTOMS/MOOD NEUROLOGICAL BOWEL NUTRITION STATUS      Continent Diet (See DC Summary)  AMBULATORY STATUS COMMUNICATION OF NEEDS Skin   Extensive Assist Verbally PU Stage and Appropriate Care                       Personal Care Assistance Level of Assistance  Bathing, Feeding, Dressing Bathing Assistance: Limited assistance Feeding assistance: Limited assistance Dressing Assistance: Limited assistance     Functional Limitations Info  Sight Sight Info: Impaired        SPECIAL CARE FACTORS FREQUENCY  PT (By licensed PT), OT (By licensed OT)     PT Frequency: 5x/week OT Frequency: 5x/week            Contractures Contractures Info: Not present    Additional Factors Info  Code Status, Allergies Code Status Info: Full Allergies Info: NKA           Current Medications (10/26/2021):  This is the current hospital active medication list Current Facility-Administered Medications  Medication Dose Route Frequency Provider Last Rate Last Admin   amoxicillin (AMOXIL) capsule 500 mg  500 mg Oral Q8H Vu, Trung T, MD   500 mg at 10/26/21 1318   bacitracin ointment   Topical BID Phillips Grout, MD   Given at 10/25/21 2152   Chlorhexidine Gluconate Cloth 2 % PADS 6 each  6 each Topical Daily Derrill Kay A, MD   6 each at 10/25/21 0921   enoxaparin (LOVENOX) injection 40 mg  40 mg Subcutaneous Q24H Pham, Minh Q, RPH-CPP   40 mg at 10/25/21 1144  feeding supplement (ENSURE ENLIVE / ENSURE PLUS) liquid 237 mL  237 mL Oral BID BM Regalado, Belkys A, MD   237 mL at [id number]   folic acid (FOLVITE) tablet 1 mg  1 mg Oral Daily Kristopher Oppenheim, DO   1 mg at 10/26/21 [id number]   Gerhardt's butt cream   Topical TID Phillips Grout, MD   Given at 10/26/21 [id number]   hydrocerin (EUCERIN) cream   Topical Daily Regalado, Belkys A, MD       lactulose (CHRONULAC) 10 GM/15ML solution 10 g  10 g Oral Daily Regalado, Belkys A, MD   10 g at 10/26/21 0733   magnesium oxide (MAG-OX) tablet 400 mg  400 mg  Oral BID Kristopher Oppenheim, DO   400 mg at 10/26/21 [id number]   magnesium sulfate IVPB 2 g 50 mL  2 g Intravenous Once Regalado, Belkys A, MD 50 mL/hr at 10/26/21 1511 2 g at 10/26/21 1511   magnesium sulfate IVPB 2 g 50 mL  2 g Intravenous Once Regalado, Belkys A, MD       multivitamin with minerals tablet 1 tablet  1 tablet Oral Daily Kristopher Oppenheim, DO   1 tablet at 10/26/21 0732   nicotine (NICODERM CQ - dosed in mg/24 hours) patch 21 mg  21 mg Transdermal Daily Kristopher Oppenheim, DO   21 mg at 10/25/21 [id number]   nutrition supplement (JUVEN) (JUVEN) powder packet 1 packet  1 packet Oral BID BM Regalado, Belkys A, MD   1 packet at 10/25/21 1338   nystatin cream (MYCOSTATIN)   Topical BID Kristopher Oppenheim, DO   Given at 10/26/21 1014   ringers solution   Intravenous Continuous Regalado, Belkys A, MD 75 mL/hr at 10/25/21 0938 New Bag at 10/25/21 [id number]   Zinc Oxide (TRIPLE PASTE) 12.8 % ointment 1 application  1 application Topical BID PRN Phillips Grout, MD         Discharge Medications: Please see discharge summary for a list of discharge medications.  Relevant Imaging Results:  Relevant Lab Results:   Additional Information SSN: [id number]. COVID + on 10/22/21  Benard Halsted, LCSW

## 2021-10-26 NOTE — Progress Notes (Signed)
ID brief note   Patient's culture polymicrobial. Non faecalis enterococcus as well, and sensitive to amp. Slight leukocytosis at presentation multifactorial. No risk factors for vascular related infectious complication. Skin changes dermatitis rather than cellulitis   Labs/vitals reviewed   A/p Polymicrobial bsi unclear significance Skin dermatitis  -at this time reasonable to finish 7 day course abx therapy directed at the enterococcus species -id will sign off -discussed with primary team

## 2021-10-26 NOTE — Evaluation (Signed)
Occupational Therapy Evaluation Patient Details Name: Dylan Weaver MRN: [id number] DOB: 1966-03-19 Today's Date: 10/26/2021   History of Present Illness 56 year old male presented to the ER 10/22/21 after being found down on the floor. Had been unable to get off the floor x 4 days when housekeeper found him. +incontinence of urine and feces with irritant contact dermatitis over bil anterior, medial thighs and perineum; sepsis; +CIWA  PMH chronic alcohol abuse, alcoholic cirrhosis, malnutrition,   Clinical Impression   Pt presents with decline in function and safety with ADLs and ADL mobility with impaired strength, balance, endurance and cognition;  pt not comprehending that he must call for assist to get back into bed from chair, posey alarm in place and turned on. Pt was informed that alarm would sound if he gets up, poor insight, pt does not remember what happened to him or why he is in the hospital. PTA pt lived at home alone and was Ind with ADLs/selfcare, IADLs, home mgt, was driving and did not use an AD for mobility. Pt currently requires min A to sit EOB, mod with LB ADLs, mo d A with si t- stand and SPTs. Pt would benefit from acute OT services to address impairments to maximize level of function and safety     Recommendations for follow up therapy are one component of a multi-disciplinary discharge planning process, led by the attending physician.  Recommendations may be updated based on patient status, additional functional criteria and insurance authorization.   Follow Up Recommendations  Skilled nursing-short term rehab (<3 hours/day)    Assistance Recommended at Discharge Frequent or constant Supervision/Assistance  Patient can return home with the following A lot of help with bathing/dressing/bathroom;A lot of help with walking and/or transfers;Direct supervision/assist for medications management;Assist for transportation;Help with stairs or ramp for entrance;Assistance  with cooking/housework    Functional Status Assessment  Patient has had a recent decline in their functional status and demonstrates the ability to make significant improvements in function in a reasonable and predictable amount of time.  Equipment Recommendations  BSC/3in1 (RW)    Recommendations for Other Services       Precautions / Restrictions Precautions Precautions: Fall;Other (comment) Precaution Comments: poor safety awareness Restrictions Weight Bearing Restrictions: No      Mobility Bed Mobility Overal bed mobility: Needs Assistance Bed Mobility: Supine to Sit, Sit to Supine     Supine to sit: Min assist, HOB elevated     General bed mobility comments: increased time    Transfers Overall transfer level: Needs assistance Equipment used: 1 person hand held assist Transfers: Sit to/from Stand Sit to Stand: Mod assist           General transfer comment: SPT to chair      Balance Overall balance assessment: Needs assistance Sitting-balance support: Bilateral upper extremity supported, Feet supported Sitting balance-Leahy Scale: Poor     Standing balance support: Bilateral upper extremity supported, During functional activity Standing balance-Leahy Scale: Poor                             ADL either performed or assessed with clinical judgement   ADL Overall ADL's : Needs assistance/impaired Eating/Feeding: Set up;Independent;Sitting   Grooming: Wash/dry hands;Wash/dry face;Min guard;Sitting   Upper Body Bathing: Min guard;Sitting   Lower Body Bathing: Moderate assistance   Upper Body Dressing : Min guard;Sitting   Lower Body Dressing: Moderate assistance   Toilet Transfer: Moderate assistance;Stand-pivot Armed forces technical officer  Details (indicate cue type and reason): simulated to recliner Toileting- Clothing Manipulation and Hygiene: Moderate assistance Toileting - Clothing Manipulation Details (indicate cue type and reason): clothing  mgt     Functional mobility during ADLs: Moderate assistance       Vision Baseline Vision/History: 1 Wears glasses Ability to See in Adequate Light: 0 Adequate Patient Visual Report: No change from baseline       Perception     Praxis      Pertinent Vitals/Pain Pain Assessment Pain Assessment: Faces Faces Pain Scale: Hurts little more Pain Location: legs and buttocks Pain Descriptors / Indicators: Sore Pain Intervention(s): Monitored during session, Repositioned     Hand Dominance Right   Extremity/Trunk Assessment Upper Extremity Assessment Upper Extremity Assessment: Generalized weakness   Lower Extremity Assessment Lower Extremity Assessment: Defer to PT evaluation   Cervical / Trunk Assessment Cervical / Trunk Assessment: Kyphotic   Communication Communication Communication: No difficulties   Cognition Arousal/Alertness: Awake/alert Behavior During Therapy: Flat affect, Impulsive Overall Cognitive Status: No family/caregiver present to determine baseline cognitive functioning Area of Impairment: Orientation, Memory, Safety/judgement, Awareness, Problem solving                 Orientation Level: Disoriented to, Place, Situation   Memory: Decreased short-term memory   Safety/Judgement: Decreased awareness of safety, Decreased awareness of deficits   Problem Solving: Requires verbal cues General Comments: pt not comprehending that he must call for assist to get back into bed from chair, posey alarm in place and tunrned on. Pt was informed that alarm would sound if he gets up, poor insight, pt does not remember what happened to him or why he is in the hospital     General Comments       Exercises     Shoulder Instructions      Home Living Family/patient expects to be discharged to:: Private residence Living Arrangements: Alone   Type of Home: House Home Access: Stairs to enter [employer] of Steps: 15 Entrance Stairs-Rails:  Right;Left       Bathroom Shower/Tub: Astronomer Accessibility: Yes       Additional Comments: RW and SPC are pt's mom's, but he can use if needed      Prior Functioning/Environment               Mobility Comments: was using RW pta due to Bil foot pain ADLs Comments: was Ind with ADLs, IADLs, has housekeeper        OT Problem List: Decreased strength;Impaired balance (sitting and/or standing);Decreased cognition;Pain;Decreased safety awareness;Decreased activity tolerance;Decreased knowledge of use of DME or AE      OT Treatment/Interventions: Self-care/ADL training;Patient/family education;Balance training;Therapeutic activities;DME and/or AE instruction    OT Goals(Current goals can be found in the care plan section) Acute Rehab OT Goals Patient Stated Goal: none stated. pt does not remember what happened to him or why he is in the hospital OT Goal Formulation: With patient Time For Goal Achievement: 11/09/21 ADL Goals Pt Will Perform Grooming: with min assist;with min guard assist;standing Pt Will Perform Upper Body Bathing: with supervision;with set-up;sitting Pt Will Perform Lower Body Bathing: with min assist;sitting/lateral leans Pt Will Perform Upper Body Dressing: with supervision;with set-up;sitting Pt Will Transfer to Toilet: with min assist;with min guard assist;stand pivot transfer;ambulating;bedside commode Pt Will Perform Toileting - Clothing Manipulation and hygiene: with min assist;sit to/from stand  OT Frequency: Min 2X/week    Co-evaluation  AM-PAC OT "6 Clicks" Daily Activity     Outcome Measure Help from another person eating meals?: None Help from another person taking care of personal grooming?: A Little Help from another person toileting, which includes using toliet, bedpan, or urinal?: A Lot Help from another person bathing (including washing, rinsing, drying)?: A Lot Help from another person to put on  and taking off regular upper body clothing?: A Little Help from another person to put on and taking off regular lower body clothing?: A Lot 6 Click Score: 16   End of Session Equipment Utilized During Treatment: Gait belt Nurse Communication: Mobility status  Activity Tolerance: Patient tolerated treatment well Patient left: in chair;with chair alarm set;with call bell/phone within reach  OT Visit Diagnosis: Unsteadiness on feet (R26.81);Other abnormalities of gait and mobility (R26.89);Muscle weakness (generalized) (M62.81);Other symptoms and signs involving cognitive function;Pain Pain - part of body: Leg (B LEs, buttocks)                Time: [id number] OT Time Calculation (min): 28 min Charges:  OT General Charges $OT Visit: 1 Visit OT Evaluation $OT Eval Moderate Complexity: 1 Mod OT Treatments $Therapeutic Activity: 8-22 mins    Britt Bottom 10/26/2021, 3:52 PM

## 2021-10-26 NOTE — Progress Notes (Signed)
PROGRESS NOTE    Dylan Weaver  [id number] DOB: 1966/04/14 DOA: 10/22/2021 PCP: [facility]   Brief Narrative: 56 year old past medical history significant for chronic alcohol abuse, alcoholic cirrhosis, malnutrition presented to ER after being found down on the floor.  Patient stated that he has been sleeping on the floor for the last 4 days on his back.  He has been unable to get off of the floor.  He has been urinating and defecating on himself.  His housekeeper came over  and called EMS.  Patient presented with lactic acid of 4.4, COVID-positive, total CK1490, white blood cell 13.  IV fluids lactate decreased to 2.3.  Patient was admitted with cellulitis, Rhabdomyolysis and sepsis with acute organ dysfunction.  CT abdomen and pelvis was negative for perineal abscess.    Assessment & Plan:   Principal Problem:   Cellulitis Active Problems:   Alcohol use disorder, severe, dependence (HCC)   Alcohol withdrawal (HCC)   Elevated LFTs   Tobacco abuse   Irritant contact dermatitis due to incontinence of both feces and urine   Rhabdomyolysis   Sepsis with acute organ dysfunction without septic shock (HCC)   Pressure injury of skin   Enterococcal bacteremia   1-Cellulitis of Bilateral thigh and Pubic Area;  -2/4 Blood culture positive for Stap epidermidis, and Enterococcus Raffinosus.  -Treated initially  with vancomycin and ceftriaxone. Plan to transition to Amoxicillin to complete 8 days.  -Repeated Blood Cultures 1/24: No growth to date.  -ID consulted. Recommend complete 7 days for enterococcus Bacteremia.  -Continue with IV antibiotics.  -Cellulitis improving   2-Alcohol use disorder, alcohol withdrawal: Completed  CIWA protocol. Continue with thiamine and folic acid. Stable.   Transaminases: Secondary to alcohol abuse and history of cirrhosis -Follow trend/.  -LFT trending down.   Tobacco abuse: We will need counseling  Sepsis with acute  organ dysfunction without septic shock: secondary to cellulitis.  Presented with hypothermia, Tachypnea RR 26, Tachycardia , leukocytosis.  Sepsis POA>  Treated with fluids, and antibiotics.   Acute metabolic encephalopathy: Secondary to alcohol withdrawal and infectious process Ammonia 42, started lactulose daily.  Mental status improving.   Irritant contact dermatitis due to incontinence of both feces and urine: Continue with nystatin cream Improving.   Rhabdomyolysis: CK 1490--- decreased to 147 after hydration Treated with IV fluids  Hyponatremia: Treated with IV fluids resolved.  Hypokalemia: Replaced orally times 2.  Hypomagnesemia: Mg 1.4. replete Magnesium/   Thrombocytopenia: Secondary to cirrhosis  COVID-positive: Chest x-ray negative, no hypoxic. Unable to provide significant history. Monotor  COVID-19 Labs Needs 10 days Quarantine.  No results for input(s): DDIMER, FERRITIN, LDH, CRP in the last 72 hours.  Lab Results  Component Value Date   [place] (A) 10/22/2021   [place] NEGATIVE 09/20/2021   [place] NEGATIVE 08/25/2021   [place] NEGATIVE 06/04/2021                                  See Wound documentation Below.      Pressure Injury 10/22/21 Toe (Comment  which one) Anterior;Left Unstageable - Full thickness tissue loss in which the base of the injury is covered by slough (yellow, tan, gray, green or brown) and/or eschar (tan, brown or black) in the wound bed. Right  (Active)  10/22/21 1640  Location: Toe (Comment  which one)  Location Orientation: Anterior;Left  Staging: Unstageable - Full thickness tissue loss in which  the base of the injury is covered by slough (yellow, tan, gray, green or brown) and/or eschar (tan, brown or black) in the wound bed.  Wound Description (Comments): Right great toe Black wound bed  Present on Admission: Yes     Pressure Injury 10/22/21 Toe (Comment  which one) Anterior;Left  Unstageable - Full thickness tissue loss in which the base of the injury is covered by slough (yellow, tan, gray, green or brown) and/or eschar (tan, brown or black) in the wound bed. left s (Active)  10/22/21 1640  Location: Toe (Comment  which one)  Location Orientation: Anterior;Left  Staging: Unstageable - Full thickness tissue loss in which the base of the injury is covered by slough (yellow, tan, gray, green or brown) and/or eschar (tan, brown or black) in the wound bed.  Wound Description (Comments): left second toe, black  Present on Admission: Yes     Pressure Injury 10/22/21 Toe (Comment  which one) Anterior;Right Unstageable - Full thickness tissue loss in which the base of the injury is covered by slough (yellow, tan, gray, green or brown) and/or eschar (tan, brown or black) in the wound bed. right (Active)  10/22/21 1600  Location: Toe (Comment  which one)  Location Orientation: Anterior;Right  Staging: Unstageable - Full thickness tissue loss in which the base of the injury is covered by slough (yellow, tan, gray, green or brown) and/or eschar (tan, brown or black) in the wound bed.  Wound Description (Comments): right second toe, black  Present on Admission: Yes     Pressure Injury 10/22/21 Penis Distal Stage 2 -  Partial thickness loss of dermis presenting as a shallow open injury with a red, pink wound bed without slough. (Active)  10/22/21 1640  Location: Penis  Location Orientation: Distal  Staging: Stage 2 -  Partial thickness loss of dermis presenting as a shallow open injury with a red, pink wound bed without slough.  Wound Description (Comments):   Present on Admission:      Nutrition Problem: Severe Malnutrition Etiology: social / environmental circumstances (EtOH abuse)    Signs/Symptoms: severe muscle depletion, severe fat depletion    Interventions: Ensure Enlive (each supplement provides 350kcal and 20 grams of protein), MVI  Estimated body mass index  is 23.73 kg/m as calculated from the following:   Height as of 09/20/21: 6' (1.829 m).   Weight as of this encounter: 79.4 kg.   DVT prophylaxis: Heparin  Code Status: Full code Family Communication: Aunt over phone updated 1/24  Disposition Plan:  Status is: Inpatient  Remains inpatient appropriate because: encephalopathy, alcohol withdrawal.   Consultants:  None  Procedures:  None  Antimicrobials:    Subjective: He is alert , oriented to place and person. Feels better. He knows why he is the hospital. He agrees with SNF placement.   Objective: Vitals:   10/26/21 0017 10/26/21 0406 10/26/21 0737 10/26/21 1100  BP: 135/85 (!) 155/87 (!) 156/96 119/85  Pulse: 84 76 87 89  Resp: 18 (!) 21 19 19   Temp: 97.9 F (36.6 C) 98 F (36.7 C) 98.2 F (36.8 C) 98.2 F (36.8 C)  TempSrc: Oral Oral Oral Oral  SpO2: 99% 100% 98% 98%  Weight:        Intake/Output Summary (Last 24 hours) at 10/26/2021 1411 Last data filed at 10/25/2021 1709 Gross per 24 hour  Intake 1170.92 ml  Output 700 ml  Net 470.92 ml    Filed Weights   10/24/21 1100  Weight: 79.4 kg  Examination:  General exam: Chronic ill appearing Respiratory system: CTA Cardiovascular system: S 1, S 2 RRR Gastrointestinal system: BS present, soft, nt Central nervous system: Alert, answer questions.  Extremities: no edema Skin: multiples bruise on his faces. BL thigh with less redness, abrasions.    Data Reviewed: I have personally reviewed following labs and imaging studies  CBC: Recent Labs  Lab 10/22/21 0103 10/23/21 0049 10/24/21 0851 10/25/21 0210 10/26/21 0424  WBC 13.3* 13.2* 3.8* 4.5 4.8  NEUTROABS 10.7* 10.5*  --   --   --   HGB 16.7 11.6* 13.0 12.2* 11.4*  HCT 51.9 35.4* 40.3 36.4* 35.1*  MCV 99.4 96.2 96.9 93.8 95.9  PLT 85* 110* 106* 104* 0000000    Basic Metabolic Panel: Recent Labs  Lab 10/22/21 0103 10/22/21 0731 10/23/21 0049 10/24/21 0851 10/25/21 0210 10/25/21 0909  10/26/21 0424  NA 137 134*  --  142 138  --  132*  K 3.9 3.1* 3.9 3.5 3.7  --  3.2*  CL 98 100  --  109 109  --  102  CO2 20* 23  --  23 21*  --  24  GLUCOSE 112* 112*  --  79 78  --  97  BUN 16 14  --  6 9  --  7  CREATININE 1.06 0.74  --  0.53* 0.53*  --  0.59*  CALCIUM 9.4 8.3*  --  8.3* 8.0*  --  8.0*  MG  --   --  1.8 1.6*  --  1.5* 1.4*    GFR: Estimated Creatinine Clearance: 114.5 mL/min (A) (by C-G formula based on SCr of 0.59 mg/dL (L)). Liver Function Tests: Recent Labs  Lab 10/22/21 0103 10/22/21 0731 10/25/21 0210 10/26/21 0424  AST 189* 120* 93* 65*  ALT 98* 67* 53* 46*  ALKPHOS 172* 123 128* 142*  BILITOT 1.9* 1.0 1.4* 0.6  PROT 9.7* 6.6 5.9* 5.9*  ALBUMIN 2.9* 2.0* 1.8* 1.7*    No results for input(s): LIPASE, AMYLASE in the last 168 hours. Recent Labs  Lab 10/24/21 1751  AMMONIA 42*    Coagulation Profile: Recent Labs  Lab 10/22/21 0334  INR 1.0    Cardiac Enzymes: Recent Labs  Lab 10/22/21 0103 10/22/21 1812 10/23/21 0049 10/24/21 0047  CKTOTAL 1,490* 934* 855* 347    BNP (last 3 results) No results for input(s): PROBNP in the last 8760 hours. HbA1C: No results for input(s): HGBA1C in the last 72 hours. CBG: No results for input(s): GLUCAP in the last 168 hours. Lipid Profile: No results for input(s): CHOL, HDL, LDLCALC, TRIG, CHOLHDL, LDLDIRECT in the last 72 hours. Thyroid Function Tests: No results for input(s): TSH, T4TOTAL, FREET4, T3FREE, THYROIDAB in the last 72 hours. Anemia Panel: No results for input(s): VITAMINB12, FOLATE, FERRITIN, TIBC, IRON, RETICCTPCT in the last 72 hours. Sepsis Labs: Recent Labs  Lab 10/22/21 0104 10/22/21 0300 10/23/21 0823 10/23/21 1140  LATICACIDVEN 4.4* 2.3* 1.4 0.9     Recent Results (from the past 240 hour(s))  Urine Culture     Status: None   Collection Time: 10/22/21 12:26 AM   Specimen: In/Out Cath Urine  Result Value Ref Range Status   Specimen Description IN/OUT CATH URINE   Final   Special Requests NONE  Final   Culture   Final    NO GROWTH Performed at [facility], [place] [address], [place], [place] [address]    Report Status 10/24/2021 FINAL  Final  Blood Culture (routine x  2)     Status: None (Preliminary result)   Collection Time: 10/22/21  1:03 AM   Specimen: BLOOD  Result Value Ref Range Status   Specimen Description BLOOD LEFT ANTECUBITAL  Final   Special Requests AEROBIC BOTTLE ONLY Blood Culture adequate volume  Final   Culture   Final    NO GROWTH 4 DAYS Performed at [facility] [address] [place], [place] [address]    Report Status PENDING  Incomplete  Resp Panel by RT-PCR (Flu A&B, Covid) Nasopharyngeal Swab     Status: Abnormal   Collection Time: 10/22/21  1:04 AM   Specimen: Nasopharyngeal Swab; Nasopharyngeal(NP) swabs in vial transport medium  Result Value Ref Range Status   SARS Coronavirus 2 by RT PCR POSITIVE (A) NEGATIVE Final    Comment: (NOTE) SARS-CoV-2 target nucleic acids are DETECTED.  The SARS-CoV-2 RNA is generally detectable in upper respiratory specimens during the acute phase of infection. Positive results are indicative of the presence of the identified virus, but do not rule out bacterial infection or co-infection with other pathogens not detected by the test. Clinical correlation with patient history and other diagnostic information is necessary to determine patient infection status. The expected result is Negative.  Fact Sheet for Patients: EntrepreneurPulse.com.au  Fact Sheet for Healthcare Providers: IncredibleEmployment.be  This test is not yet approved or cleared by the [place] FDA and  has been authorized for detection and/or diagnosis of SARS-CoV-2 by FDA under an Emergency Use Authorization (EUA).  This EUA will remain in effect (meaning this test can be used) for the duration of  the COVID-19 declaration under Section 564(b)(1) of the A ct,  21 U.S.C. section 360bbb-3(b)(1), unless the authorization is terminated or revoked sooner.     Influenza A by PCR NEGATIVE NEGATIVE Final   Influenza B by PCR NEGATIVE NEGATIVE Final    Comment: (NOTE) The Xpert Xpress SARS-CoV-2/FLU/RSV plus assay is intended as an aid in the diagnosis of influenza from Nasopharyngeal swab specimens and should not be used as a sole basis for treatment. Nasal washings and aspirates are unacceptable for Xpert Xpress SARS-CoV-2/FLU/RSV testing.  Fact Sheet for Patients: EntrepreneurPulse.com.au  Fact Sheet for Healthcare Providers: IncredibleEmployment.be  This test is not yet approved or cleared by the [place] FDA and has been authorized for detection and/or diagnosis of SARS-CoV-2 by FDA under an Emergency Use Authorization (EUA). This EUA will remain in effect (meaning this test can be used) for the duration of the COVID-19 declaration under Section 564(b)(1) of the Act, 21 U.S.C. section 360bbb-3(b)(1), unless the authorization is terminated or revoked.  Performed at [facility], [place] [address], [place], [place] [address]   Blood Culture (routine x 2)     Status: Abnormal   Collection Time: 10/22/21  3:00 AM   Specimen: BLOOD  Result Value Ref Range Status   Specimen Description BLOOD RIGHT ANTECUBITAL  Final   Special Requests   Final    BOTTLES DRAWN AEROBIC AND ANAEROBIC Blood Culture adequate volume   Culture  Setup Time   Final    GRAM POSITIVE COCCI IN BOTH AEROBIC AND ANAEROBIC BOTTLES CRITICAL RESULT CALLED TO, READ BACK BY AND VERIFIED WITH: B MANCHERIL,PHARMD@0625  10/23/21 [place]    Culture (A)  Final    STAPHYLOCOCCUS HOMINIS ENTEROCOCCUS RAFFINOSUS CRITICAL RESULT CALLED TO, READ BACK BY AND VERIFIED WITH: PHARMD A UELAND [id number] AT 749 AM BY CM STAPHYLOCOCCUS EPIDERMIDIS THE SIGNIFICANCE OF ISOLATING THIS ORGANISM FROM A SINGLE  SET OF BLOOD CULTURES WHEN MULTIPLE SETS ARE DRAWN  IS UNCERTAIN. PLEASE NOTIFY THE MICROBIOLOGY DEPARTMENT WITHIN ONE WEEK IF SPECIATION AND SENSITIVITIES ARE REQUIRED. Performed at [facility], [place] [address] [place], [place] [address]    Report Status 10/25/2021 FINAL  Final   Organism ID, Bacteria ENTEROCOCCUS RAFFINOSUS  Final      Susceptibility   Enterococcus raffinosus - MIC*    AMPICILLIN 8 SENSITIVE Sensitive     VANCOMYCIN 1 SENSITIVE Sensitive     GENTAMICIN SYNERGY SENSITIVE Sensitive     * ENTEROCOCCUS RAFFINOSUS  Blood Culture ID Panel (Reflexed)     Status: Abnormal   Collection Time: 10/22/21  3:00 AM  Result Value Ref Range Status   Enterococcus faecalis NOT DETECTED NOT DETECTED Final   Enterococcus Faecium NOT DETECTED NOT DETECTED Final   Listeria monocytogenes NOT DETECTED NOT DETECTED Final   Staphylococcus species DETECTED (A) NOT DETECTED Final    Comment: CRITICAL RESULT CALLED TO, READ BACK BY AND VERIFIED WITH: B MANCHERIL,PHARMD@0625  10/23/21 [place]    Staphylococcus aureus (BCID) NOT DETECTED NOT DETECTED Final   Staphylococcus epidermidis DETECTED (A) NOT DETECTED Final    Comment: Methicillin (oxacillin) resistant coagulase negative staphylococcus. Possible blood culture contaminant (unless isolated from more than one blood culture draw or clinical case suggests pathogenicity). No antibiotic treatment is indicated for blood  culture contaminants. CRITICAL RESULT CALLED TO, READ BACK BY AND VERIFIED WITH: B MANCHERIL,PHARMD@0625  10/23/21 [place]    Staphylococcus lugdunensis NOT DETECTED NOT DETECTED Final   Streptococcus species NOT DETECTED NOT DETECTED Final   Streptococcus agalactiae NOT DETECTED NOT DETECTED Final   Streptococcus pneumoniae NOT DETECTED NOT DETECTED Final   Streptococcus pyogenes NOT DETECTED NOT DETECTED Final   A.calcoaceticus-baumannii NOT DETECTED NOT DETECTED Final   Bacteroides fragilis NOT DETECTED NOT DETECTED Final   Enterobacterales NOT DETECTED NOT DETECTED Final    Enterobacter cloacae complex NOT DETECTED NOT DETECTED Final   Escherichia coli NOT DETECTED NOT DETECTED Final   Klebsiella aerogenes NOT DETECTED NOT DETECTED Final   Klebsiella oxytoca NOT DETECTED NOT DETECTED Final   Klebsiella pneumoniae NOT DETECTED NOT DETECTED Final   Proteus species NOT DETECTED NOT DETECTED Final   Salmonella species NOT DETECTED NOT DETECTED Final   Serratia marcescens NOT DETECTED NOT DETECTED Final   Haemophilus influenzae NOT DETECTED NOT DETECTED Final   Neisseria meningitidis NOT DETECTED NOT DETECTED Final   Pseudomonas aeruginosa NOT DETECTED NOT DETECTED Final   Stenotrophomonas maltophilia NOT DETECTED NOT DETECTED Final   Candida albicans NOT DETECTED NOT DETECTED Final   Candida auris NOT DETECTED NOT DETECTED Final   Candida glabrata NOT DETECTED NOT DETECTED Final   Candida krusei NOT DETECTED NOT DETECTED Final   Candida parapsilosis NOT DETECTED NOT DETECTED Final   Candida tropicalis NOT DETECTED NOT DETECTED Final   Cryptococcus neoformans/gattii NOT DETECTED NOT DETECTED Final   Methicillin resistance mecA/C DETECTED (A) NOT DETECTED Final    Comment: CRITICAL RESULT CALLED TO, READ BACK BY AND VERIFIED WITH: B MANCHERIL,PHARMD@0625  10/23/21 [place] Performed at [facility], [address], [place], [place] [address]   Culture, blood (routine x 2)     Status: None (Preliminary result)   Collection Time: 10/25/21  9:08 AM   Specimen: BLOOD LEFT HAND  Result Value Ref Range Status   Specimen Description BLOOD LEFT HAND  Final   Special Requests   Final    BOTTLES DRAWN AEROBIC ONLY Blood Culture results may not be  optimal due to an inadequate volume of blood received in culture bottles   Culture   Final    NO GROWTH < 24 HOURS Performed at [facility] [address], [place], [place] [address]    Report Status PENDING  Incomplete  Culture, blood (routine x 2)     Status: None (Preliminary result)   Collection Time: 10/25/21   9:09 AM   Specimen: BLOOD RIGHT HAND  Result Value Ref Range Status   Specimen Description BLOOD RIGHT HAND  Final   Special Requests   Final    BOTTLES DRAWN AEROBIC ONLY Blood Culture results may not be optimal due to an inadequate volume of blood received in culture bottles   Culture   Final    NO GROWTH < 24 HOURS Performed at [facility], [place] [address], [place], [place] [address]    Report Status PENDING  Incomplete        Radiology Studies: No results found.      Scheduled Meds:  amoxicillin  500 mg Oral Q8H   bacitracin   Topical BID   Chlorhexidine Gluconate Cloth  6 each Topical Daily   enoxaparin (LOVENOX) injection  40 mg Subcutaneous Q24H   feeding supplement  237 mL Oral BID BM   folic acid  1 mg Oral Daily   Gerhardt's butt cream   Topical TID   lactulose  10 g Oral Daily   magnesium oxide  400 mg Oral BID   multivitamin with minerals  1 tablet Oral Daily   nicotine  21 mg Transdermal Daily   nutrition supplement (JUVEN)  1 packet Oral BID BM   nystatin cream   Topical BID   Continuous Infusions:  ringers 75 mL/hr at 10/25/21 0938     LOS: 4 days    Time spent: 35 minutes    Roe Koffman A Byran Bilotti, MD [facility]   If 7PM-7AM, please contact night-coverage [email or web address]  10/26/2021, 2:11 PM

## 2021-10-26 NOTE — Plan of Care (Signed)

## 2021-10-26 NOTE — TOC Initial Note (Signed)
Transition of Care [facility]) - Initial/Assessment Note    Patient Details  Name: Dylan Weaver MRN: [id number] Date of Birth: [birth year]  Transition of Care [facility]) CM/SW Contact:    Benard Halsted, LCSW Phone Number: 10/26/2021, 3:40 PM  Clinical Narrative:                 CSW received approval from the [place] for a 32 day contract (not the optum list). Once facility is located, [facility] to call [place] [phone]) for auth details.   -CSW contacted [facility] (now [place]). Per Mechele Claude they do not currently have beds available but can review referral and keep CSW posted (f. [phone], do not accept COVID+ until day 11).   -[facility]: reviewing referral (does not accept COVID + until day 14)  -[place]: Spoke with Fraser Din and sent referral to [email or web address]; they are reviewing referral (does not accept COVID + until day 11).  [facility]: Spoke with Maggie; reviewing referral (f. [phone], do not accept COVID + until day 11).   Expected Discharge Plan: Skilled Nursing Facility Barriers to Discharge: SNF Covid, SNF Pending bed offer   Patient Goals and CMS Choice Patient states their goals for this hospitalization and ongoing recovery are:: Rehab CMS Medicare.gov Compare Post Acute Care list provided to:: Patient Choice offered to / list presented to : Patient  Expected Discharge Plan and Services Expected Discharge Plan: [place] In-house Referral: Clinical Social Work   Post Acute Care Choice: [place] Living arrangements for the past 2 months: [place]                                      Prior Living Arrangements/Services Living arrangements for the past 2 months: Single Family Home Lives with:: Self Patient language and need for interpreter reviewed:: Yes Do you feel safe going back to the place where you live?: Yes      Need for Family Participation in Patient  Care: Yes (Comment) Care giver support system in place?: Yes (comment) Current home services: DME Criminal Activity/Legal Involvement Pertinent to Current Situation/Hospitalization: No - Comment as needed  Activities of Daily Living      Permission Sought/Granted Permission sought to share information with : Facility Sport and exercise psychologist, Family Supports Permission granted to share information with : No     Permission granted to share info w AGENCY: SNFs        Emotional Assessment Appearance:: Appears stated age Attitude/Demeanor/Rapport: Unable to Assess Affect (typically observed): Unable to Assess Orientation: : Oriented to Self, Oriented to Place Alcohol / Substance Use: Alcohol Use Psych Involvement: No (comment)  Admission diagnosis:  Cellulitis [L03.90] Non-traumatic rhabdomyolysis [M62.82] Cellulitis, unspecified cellulitis site [L03.90] Alcohol withdrawal ([place]) [F10.939] Patient Active Problem List   Diagnosis Date Noted   Enterococcal bacteremia    Pressure injury of skin 10/24/2021   Irritant contact dermatitis due to incontinence of both feces and urine 10/22/2021   Rhabdomyolysis 10/22/2021   Sepsis with acute organ dysfunction without septic shock ([place]) 10/22/2021   Hepatic steatosis 08/31/2021   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 breakdown of skin ([place])    Cellulitis 08/26/2021   Tobacco abuse 08/26/2021   Infection of right foot with necrosis 08/26/2021   Foot infection 08/26/2021   Decompensated hepatic cirrhosis ([place]) 08/25/2021   Protein-calorie malnutrition, severe 06/14/2021  Elevated LFTs 11/29/2020   Intertrigo 11/29/2020   Prolonged QT interval 11/29/2020   Malnutrition of moderate degree 09/16/2020   Alcohol withdrawal ([place]) 123456   Alcoholic hepatitis 123456   Thrombocytopenia ([place]) 09/14/2020   Normochromic normocytic anemia 09/14/2020   Alcohol use disorder, severe, dependence ([place]) 06/19/2020    Alcohol abuse with intoxication ([place]) 06/19/2020   PCP:  Clinic, [place]:   [id number] [place], [place] [address] [place] [address] Phone: [phone] Fax: [phone]  [place] [address] Phone: [phone] Fax: [phone]     Social Determinants of Health (SDOH) Interventions    Readmission Risk Interventions No flowsheet data found.

## 2021-10-27 DIAGNOSIS — L03119 Cellulitis of unspecified part of limb: Secondary | ICD-10-CM | POA: Diagnosis not present

## 2021-10-27 LAB — BASIC METABOLIC PANEL
Anion gap: 7 (ref 5–15)
BUN: <5 mg/dL — ABNORMAL LOW (ref 6–20)
CO2: 24 mmol/L (ref 22–32)
Calcium: 8.3 mg/dL — ABNORMAL LOW (ref 8.9–10.3)
Chloride: 104 mmol/L (ref 98–111)
Creatinine, Ser: 0.47 mg/dL — ABNORMAL LOW (ref 0.61–1.24)
GFR, Estimated: >60 mL/min (ref >60)
Glucose, Bld: 85 mg/dL (ref 70–99)
Potassium: 3.7 mmol/L (ref 3.5–5.1)
Sodium: 135 mmol/L (ref 135–145)

## 2021-10-27 LAB — MAGNESIUM: Magnesium: 1.7 mg/dL (ref 1.7–2.4)

## 2021-10-27 LAB — CULTURE, BLOOD (ROUTINE X 2)
Culture: NO GROWTH
Special Requests: ADEQUATE

## 2021-10-27 MED ORDER — MAGNESIUM SULFATE 2 GM/50ML IV SOLN
2.0000 g | Freq: Once | INTRAVENOUS | Status: AC
  Administered 2021-10-27: 2 g via INTRAVENOUS
  Filled 2021-10-27: qty 50

## 2021-10-27 NOTE — Progress Notes (Signed)
Occupational Therapy Treatment Patient Details Name: Dylan Weaver MRN: [id number] DOB: [birth year] Today's Date: 10/27/2021   History of present illness 56 year old male presented to the ER 10/22/21 after being found down on the floor. Had been unable to get off the floor x 4 days when housekeeper found him. +incontinence of urine and feces with irritant contact dermatitis over bil anterior, medial thighs and perineum; sepsis; +CIWA  PMH chronic alcohol abuse, alcoholic cirrhosis, malnutrition,   OT comments  Pt making progress with functional goals. Pt in bed upon arrival and required some encouragement to participate but eventually agreed after educating pt on benefits of being OOB. Session focused on bed mobility to sit EOB, sit - stand to SPT to chair and BSC, toileting tasks, UB dressing, grooming and donning socks. Pt continues to demonstrate cognitive deficits with STM, comprehension, attention, safety awareness, insight and problem solving; pt impulsive. OT will continue to follow acutely to maximize level of function and safety   Recommendations for follow up therapy are one component of a multi-disciplinary discharge planning process, led by the attending physician.  Recommendations may be updated based on patient status, additional functional criteria and insurance authorization.    Follow Up Recommendations  Skilled nursing-short term rehab (<3 hours/day)    Assistance Recommended at Discharge Frequent or constant Supervision/Assistance  Patient can return home with the following  A lot of help with bathing/dressing/bathroom;A lot of help with walking and/or transfers;Direct supervision/assist for medications management;Assist for transportation;Help with stairs or ramp for entrance;Assistance with cooking/housework   Equipment Recommendations  BSC/3in1    Recommendations for Other Services      Precautions / Restrictions Precautions Precautions: Fall;Other  (comment) Precaution Comments: poor safety awareness Restrictions Weight Bearing Restrictions: No       Mobility Bed Mobility Overal bed mobility: Needs Assistance Bed Mobility: Supine to Sit, Sit to Supine     Supine to sit: Min guard Sit to supine: Min guard   General bed mobility comments: increased time and effort    Transfers Overall transfer level: Needs assistance Equipment used: 1 person hand held assist Transfers: Sit to/from Stand Sit to Stand: Mod assist           General transfer comment: SPT to chair and back to bed     Balance Overall balance assessment: Needs assistance Sitting-balance support: Bilateral upper extremity supported, Feet supported Sitting balance-Leahy Scale: Fair     Standing balance support: Bilateral upper extremity supported, During functional activity Standing balance-Leahy Scale: Poor                             ADL either performed or assessed with clinical judgement   ADL Overall ADL's : Needs assistance/impaired     Grooming: Wash/dry hands;Wash/dry face;Min guard;Sitting           Upper Body Dressing : Min guard;Sitting   Lower Body Dressing: Moderate assistance;Sitting/lateral leans Lower Body Dressing Details (indicate cue type and reason): to don socks seated EOB Toilet Transfer: Moderate assistance;Stand-pivot;Cueing for safety;Cueing for sequencing Toilet Transfer Details (indicate cue type and reason): simulated to recliner Toileting- Clothing Manipulation and Hygiene: Moderate assistance       Functional mobility during ADLs: Moderate assistance;Cueing for safety;Cueing for sequencing      Extremity/Trunk Assessment Upper Extremity Assessment Upper Extremity Assessment: Generalized weakness   Lower Extremity Assessment Lower Extremity Assessment: Defer to PT evaluation   Cervical / Trunk Assessment Cervical / Trunk Assessment: Kyphotic  Vision Baseline Vision/History: 1 Wears  glasses Ability to See in Adequate Light: 0 Adequate Patient Visual Report: No change from baseline     Perception     Praxis      Cognition Arousal/Alertness: Awake/alert Behavior During Therapy: Flat affect, Impulsive Overall Cognitive Status: No family/caregiver present to determine baseline cognitive functioning Area of Impairment: Orientation, Memory, Safety/judgement, Awareness, Problem solving                 Orientation Level: Disoriented to, Time, Situation   Memory: Decreased short-term memory   Safety/Judgement: Decreased awareness of safety, Decreased awareness of deficits     General Comments: pt now aware that he is in hospital but doesn't remember what happened        Exercises      Shoulder Instructions       General Comments      Pertinent Vitals/ Pain       Pain Assessment Pain Assessment: Faces Faces Pain Scale: Hurts a little bit Pain Location: legs and buttocks Pain Descriptors / Indicators: Sore Pain Intervention(s): Monitored during session, Repositioned  Home Living                                          Prior Functioning/Environment              Frequency  Min 2X/week        Progress Toward Goals  OT Goals(current goals can now be found in the care plan section)  Progress towards OT goals: Progressing toward goals     Plan Discharge plan remains appropriate;Frequency remains appropriate    Co-evaluation                 AM-PAC OT "6 Clicks" Daily Activity     Outcome Measure   Help from another person eating meals?: None Help from another person taking care of personal grooming?: A Little Help from another person toileting, which includes using toliet, bedpan, or urinal?: A Lot Help from another person bathing (including washing, rinsing, drying)?: A Lot Help from another person to put on and taking off regular upper body clothing?: A Little Help from another person to put on and  taking off regular lower body clothing?: A Lot 6 Click Score: 16    End of Session Equipment Utilized During Treatment: Gait belt  OT Visit Diagnosis: Unsteadiness on feet (R26.81);Other abnormalities of gait and mobility (R26.89);Muscle weakness (generalized) (M62.81);Other symptoms and signs involving cognitive function;Pain Pain - part of body: Leg   Activity Tolerance Patient tolerated treatment well   Patient Left with call bell/phone within reach;in bed;with bed alarm set   Nurse Communication Mobility status        Time: [id number] OT Time Calculation (min): 26 min  Charges: OT General Charges $OT Visit: 1 Visit OT Treatments $Self Care/Home Management : 8-22 mins $Therapeutic Activity: 8-22 mins    Britt Bottom 10/27/2021, 3:23 PM

## 2021-10-27 NOTE — Progress Notes (Signed)
PROGRESS NOTE    Dylan Weaver  [id number] DOB: 1966/03/04 DOA: 10/22/2021 PCP: [facility]   Brief Narrative: 56 year old past medical history significant for chronic alcohol abuse, alcoholic cirrhosis, malnutrition presented to ER after being found down on the floor.  Patient stated that he has been sleeping on the floor for the last 4 days on his back.  He has been unable to get off of the floor.  He has been urinating and defecating on himself.  His housekeeper came over  and called EMS.  Patient presented with lactic acid of 4.4, COVID-positive, total CK1490, white blood cell 13.  IV fluids lactate decreased to 2.3.  Patient was admitted with cellulitis, Rhabdomyolysis and sepsis with acute organ dysfunction.  CT abdomen and pelvis was negative for perineal abscess.    Assessment & Plan:   Principal Problem:   Cellulitis Active Problems:   Alcohol use disorder, severe, dependence (HCC)   Alcohol withdrawal (HCC)   Elevated LFTs   Tobacco abuse   Irritant contact dermatitis due to incontinence of both feces and urine   Rhabdomyolysis   Sepsis with acute organ dysfunction without septic shock (HCC)   Pressure injury of skin   Enterococcal bacteremia   1-Cellulitis of Bilateral thigh and Pubic Area;  -2/4 Blood culture positive for Stap epidermidis, and Enterococcus Raffinosus.  -Treated initially  with vancomycin and ceftriaxone. Transition to  Amoxicillin to complete 8 days.  -Repeated Blood Cultures 1/24: No growth to date.  -ID consulted. Recommend complete 7 days for enterococcus Bacteremia.  -Continue with IV antibiotics.  -Cellulitis resolving.    2-Alcohol use disorder, alcohol withdrawal: Completed  CIWA protocol. Continue with thiamine and folic acid. Stable.   Transaminases: Secondary to alcohol abuse and history of cirrhosis -Follow trend/.  -LFT trending down.   Tobacco abuse: We will need counseling  Sepsis with acute organ  dysfunction without septic shock: secondary to cellulitis.  Presented with hypothermia, Tachypnea RR 26, Tachycardia , leukocytosis.  Sepsis POA> resolved/  Treated with fluids, and antibiotics.   Acute metabolic encephalopathy: Secondary to alcohol withdrawal and infectious process Ammonia 42, started lactulose daily.  Mental status improving.   Irritant contact dermatitis due to incontinence of both feces and urine: Continue with nystatin cream Improving.   Rhabdomyolysis: CK 1490--- decreased to 147 after hydration Treated with IV fluids  Hyponatremia: Treated with IV fluids resolved.  Hypokalemia: Replaced.   Hypomagnesemia: Mg 1.7. replete Magnesium/ orally. He remove IV , refuse IV mag.   Thrombocytopenia: Secondary to cirrhosis/ alcohol. Resolved.   COVID-positive: Chest x-ray negative, no hypoxic. Unable to provide significant history. Monotor  COVID-19 Labs Needs 10 days Quarantine.  No results for input(s): DDIMER, FERRITIN, LDH, CRP in the last 72 hours.  Lab Results  Component Value Date   [place] (A) 10/22/2021   [place] NEGATIVE 09/20/2021   [place] NEGATIVE 08/25/2021   [place] NEGATIVE 06/04/2021                                  See Wound documentation Below.      Pressure Injury 10/22/21 Toe (Comment  which one) Anterior;Left Unstageable - Full thickness tissue loss in which the base of the injury is covered by slough (yellow, tan, gray, green or brown) and/or eschar (tan, brown or black) in the wound bed. Right  (Active)  10/22/21 1640  Location: Toe (Comment  which one)  Location Orientation: Anterior;Left  Staging: Unstageable - Full thickness tissue loss in which the base of the injury is covered by slough (yellow, tan, gray, green or brown) and/or eschar (tan, brown or black) in the wound bed.  Wound Description (Comments): Right great toe Black wound bed  Present on Admission: Yes     Pressure Injury  10/22/21 Toe (Comment  which one) Anterior;Left Unstageable - Full thickness tissue loss in which the base of the injury is covered by slough (yellow, tan, gray, green or brown) and/or eschar (tan, brown or black) in the wound bed. left s (Active)  10/22/21 1640  Location: Toe (Comment  which one)  Location Orientation: Anterior;Left  Staging: Unstageable - Full thickness tissue loss in which the base of the injury is covered by slough (yellow, tan, gray, green or brown) and/or eschar (tan, brown or black) in the wound bed.  Wound Description (Comments): left second toe, black  Present on Admission: Yes     Pressure Injury 10/22/21 Toe (Comment  which one) Anterior;Right Unstageable - Full thickness tissue loss in which the base of the injury is covered by slough (yellow, tan, gray, green or brown) and/or eschar (tan, brown or black) in the wound bed. right (Active)  10/22/21 1600  Location: Toe (Comment  which one)  Location Orientation: Anterior;Right  Staging: Unstageable - Full thickness tissue loss in which the base of the injury is covered by slough (yellow, tan, gray, green or brown) and/or eschar (tan, brown or black) in the wound bed.  Wound Description (Comments): right second toe, black  Present on Admission: Yes     Pressure Injury 10/22/21 Penis Distal Stage 2 -  Partial thickness loss of dermis presenting as a shallow open injury with a red, pink wound bed without slough. (Active)  10/22/21 1640  Location: Penis  Location Orientation: Distal  Staging: Stage 2 -  Partial thickness loss of dermis presenting as a shallow open injury with a red, pink wound bed without slough.  Wound Description (Comments):   Present on Admission:      Nutrition Problem: Severe Malnutrition Etiology: social / environmental circumstances (EtOH abuse)    Signs/Symptoms: severe muscle depletion, severe fat depletion    Interventions: Ensure Enlive (each supplement provides 350kcal and 20  grams of protein), MVI  Estimated body mass index is 23.73 kg/m as calculated from the following:   Height as of 09/20/21: 6' (1.829 m).   Weight as of this encounter: 79.4 kg.   DVT prophylaxis: Heparin  Code Status: Full code Family Communication: Aunt over phone updated 1/24  Disposition Plan:  Status is: Inpatient  Remains inpatient appropriate because: encephalopathy, alcohol withdrawal. Awaiting SNF  Consultants:  None  Procedures:  None  Antimicrobials:    Subjective: He is alert, no new complaints.   Objective: Vitals:   10/26/21 2300 10/27/21 0451 10/27/21 0715 10/27/21 1201  BP: 127/81 138/85 128/85 110/90  Pulse: 68 65 86 85  Resp: 14 15 20 18   Temp: 98.2 F (36.8 C)  (!) 97.2 F (36.2 C) 97.6 F (36.4 C)  TempSrc: Oral  Axillary Oral  SpO2: 98% 95% 96% 99%  Weight:        Intake/Output Summary (Last 24 hours) at 10/27/2021 1358 Last data filed at 10/27/2021 1203 Gross per 24 hour  Intake 1605.57 ml  Output 2400 ml  Net -794.43 ml    Filed Weights   10/24/21 1100  Weight: 79.4 kg    Examination:  General exam: Chronic ill Appearing Respiratory system:  CTA Cardiovascular system:  S 1, S 2 RRR Gastrointestinal system: BS present, soft, nt Central nervous system: alert Extremities: No edema Skin: multiples bruise on his faces. BL thigh with less redness, abrasions.    Data Reviewed: I have personally reviewed following labs and imaging studies  CBC: Recent Labs  Lab 10/22/21 0103 10/23/21 0049 10/24/21 0851 10/25/21 0210 10/26/21 0424  WBC 13.3* 13.2* 3.8* 4.5 4.8  NEUTROABS 10.7* 10.5*  --   --   --   HGB 16.7 11.6* 13.0 12.2* 11.4*  HCT 51.9 35.4* 40.3 36.4* 35.1*  MCV 99.4 96.2 96.9 93.8 95.9  PLT 85* 110* 106* 104* 0000000    Basic Metabolic Panel: Recent Labs  Lab 10/22/21 0731 10/23/21 0049 10/24/21 0851 10/25/21 0210 10/25/21 0909 10/26/21 0424 10/27/21 0844  NA 134*  --  142 138  --  132* 135  K 3.1* 3.9 3.5 3.7   --  3.2* 3.7  CL 100  --  109 109  --  102 104  CO2 23  --  23 21*  --  24 24  GLUCOSE 112*  --  79 78  --  97 85  BUN 14  --  6 9  --  7 <5*  CREATININE 0.74  --  0.53* 0.53*  --  0.59* 0.47*  CALCIUM 8.3*  --  8.3* 8.0*  --  8.0* 8.3*  MG  --  1.8 1.6*  --  1.5* 1.4* 1.7    GFR: Estimated Creatinine Clearance: 114.5 mL/min (A) (by C-G formula based on SCr of 0.47 mg/dL (L)). Liver Function Tests: Recent Labs  Lab 10/22/21 0103 10/22/21 0731 10/25/21 0210 10/26/21 0424  AST 189* 120* 93* 65*  ALT 98* 67* 53* 46*  ALKPHOS 172* 123 128* 142*  BILITOT 1.9* 1.0 1.4* 0.6  PROT 9.7* 6.6 5.9* 5.9*  ALBUMIN 2.9* 2.0* 1.8* 1.7*    No results for input(s): LIPASE, AMYLASE in the last 168 hours. Recent Labs  Lab 10/24/21 1751  AMMONIA 42*    Coagulation Profile: Recent Labs  Lab 10/22/21 0334  INR 1.0    Cardiac Enzymes: Recent Labs  Lab 10/22/21 0103 10/22/21 1812 10/23/21 0049 10/24/21 0047  CKTOTAL 1,490* 934* 855* 347    BNP (last 3 results) No results for input(s): PROBNP in the last 8760 hours. HbA1C: No results for input(s): HGBA1C in the last 72 hours. CBG: No results for input(s): GLUCAP in the last 168 hours. Lipid Profile: No results for input(s): CHOL, HDL, LDLCALC, TRIG, CHOLHDL, LDLDIRECT in the last 72 hours. Thyroid Function Tests: No results for input(s): TSH, T4TOTAL, FREET4, T3FREE, THYROIDAB in the last 72 hours. Anemia Panel: No results for input(s): VITAMINB12, FOLATE, FERRITIN, TIBC, IRON, RETICCTPCT in the last 72 hours. Sepsis Labs: Recent Labs  Lab 10/22/21 0104 10/22/21 0300 10/23/21 0823 10/23/21 1140  LATICACIDVEN 4.4* 2.3* 1.4 0.9     Recent Results (from the past 240 hour(s))  Urine Culture     Status: None   Collection Time: 10/22/21 12:26 AM   Specimen: In/Out Cath Urine  Result Value Ref Range Status   Specimen Description IN/OUT CATH URINE  Final   Special Requests NONE  Final   Culture   Final    NO  GROWTH Performed at [facility], [place] [address] [place], [place] [address]    Report Status 10/24/2021 FINAL  Final  Blood Culture (routine x 2)     Status: None   Collection Time: 10/22/21  1:03 AM   Specimen: BLOOD  Result Value Ref Range Status   Specimen Description BLOOD LEFT ANTECUBITAL  Final   Special Requests AEROBIC BOTTLE ONLY Blood Culture adequate volume  Final   Culture   Final    NO GROWTH 5 DAYS Performed at [facility] [address], [place], [place] [address]    Report Status 10/27/2021 FINAL  Final  Resp Panel by RT-PCR (Flu A&B, Covid) Nasopharyngeal Swab     Status: Abnormal   Collection Time: 10/22/21  1:04 AM   Specimen: Nasopharyngeal Swab; Nasopharyngeal(NP) swabs in vial transport medium  Result Value Ref Range Status   SARS Coronavirus 2 by RT PCR POSITIVE (A) NEGATIVE Final    Comment: (NOTE) SARS-CoV-2 target nucleic acids are DETECTED.  The SARS-CoV-2 RNA is generally detectable in upper respiratory specimens during the acute phase of infection. Positive results are indicative of the presence of the identified virus, but do not rule out bacterial infection or co-infection with other pathogens not detected by the test. Clinical correlation with patient history and other diagnostic information is necessary to determine patient infection status. The expected result is Negative.  Fact Sheet for Patients: EntrepreneurPulse.com.au  Fact Sheet for Healthcare Providers: IncredibleEmployment.be  This test is not yet approved or cleared by the [place] FDA and  has been authorized for detection and/or diagnosis of SARS-CoV-2 by FDA under an Emergency Use Authorization (EUA).  This EUA will remain in effect (meaning this test can be used) for the duration of  the COVID-19 declaration under Section 564(b)(1) of the A ct, 21 U.S.C. section 360bbb-3(b)(1), unless the authorization is terminated or  revoked sooner.     Influenza A by PCR NEGATIVE NEGATIVE Final   Influenza B by PCR NEGATIVE NEGATIVE Final    Comment: (NOTE) The Xpert Xpress SARS-CoV-2/FLU/RSV plus assay is intended as an aid in the diagnosis of influenza from Nasopharyngeal swab specimens and should not be used as a sole basis for treatment. Nasal washings and aspirates are unacceptable for Xpert Xpress SARS-CoV-2/FLU/RSV testing.  Fact Sheet for Patients: EntrepreneurPulse.com.au  Fact Sheet for Healthcare Providers: IncredibleEmployment.be  This test is not yet approved or cleared by the [place] FDA and has been authorized for detection and/or diagnosis of SARS-CoV-2 by FDA under an Emergency Use Authorization (EUA). This EUA will remain in effect (meaning this test can be used) for the duration of the COVID-19 declaration under Section 564(b)(1) of the Act, 21 U.S.C. section 360bbb-3(b)(1), unless the authorization is terminated or revoked.  Performed at [facility], [place] [address], [place], [place] [address]   Blood Culture (routine x 2)     Status: Abnormal   Collection Time: 10/22/21  3:00 AM   Specimen: BLOOD  Result Value Ref Range Status   Specimen Description BLOOD RIGHT ANTECUBITAL  Final   Special Requests   Final    BOTTLES DRAWN AEROBIC AND ANAEROBIC Blood Culture adequate volume   Culture  Setup Time   Final    GRAM POSITIVE COCCI IN BOTH AEROBIC AND ANAEROBIC BOTTLES CRITICAL RESULT CALLED TO, READ BACK BY AND VERIFIED WITH: B MANCHERIL,PHARMD@0625  10/23/21 [place]    Culture (A)  Final    STAPHYLOCOCCUS HOMINIS ENTEROCOCCUS RAFFINOSUS CRITICAL RESULT CALLED TO, READ BACK BY AND VERIFIED WITH: PHARMD A UELAND [id number] AT 52 AM BY CM STAPHYLOCOCCUS EPIDERMIDIS THE SIGNIFICANCE OF ISOLATING THIS ORGANISM FROM A SINGLE SET OF BLOOD CULTURES WHEN MULTIPLE SETS ARE DRAWN IS UNCERTAIN. PLEASE NOTIFY THE  MICROBIOLOGY DEPARTMENT WITHIN ONE WEEK IF  SPECIATION AND SENSITIVITIES ARE REQUIRED. Performed at [facility], [place] [address], [place], [place] [address]    Report Status 10/25/2021 FINAL  Final   Organism ID, Bacteria ENTEROCOCCUS RAFFINOSUS  Final      Susceptibility   Enterococcus raffinosus - MIC*    AMPICILLIN 8 SENSITIVE Sensitive     VANCOMYCIN 1 SENSITIVE Sensitive     GENTAMICIN SYNERGY SENSITIVE Sensitive     * ENTEROCOCCUS RAFFINOSUS  Blood Culture ID Panel (Reflexed)     Status: Abnormal   Collection Time: 10/22/21  3:00 AM  Result Value Ref Range Status   Enterococcus faecalis NOT DETECTED NOT DETECTED Final   Enterococcus Faecium NOT DETECTED NOT DETECTED Final   Listeria monocytogenes NOT DETECTED NOT DETECTED Final   Staphylococcus species DETECTED (A) NOT DETECTED Final    Comment: CRITICAL RESULT CALLED TO, READ BACK BY AND VERIFIED WITH: B MANCHERIL,PHARMD@0625  10/23/21 [place]    Staphylococcus aureus (BCID) NOT DETECTED NOT DETECTED Final   Staphylococcus epidermidis DETECTED (A) NOT DETECTED Final    Comment: Methicillin (oxacillin) resistant coagulase negative staphylococcus. Possible blood culture contaminant (unless isolated from more than one blood culture draw or clinical case suggests pathogenicity). No antibiotic treatment is indicated for blood  culture contaminants. CRITICAL RESULT CALLED TO, READ BACK BY AND VERIFIED WITH: B MANCHERIL,PHARMD@0625  10/23/21 [place]    Staphylococcus lugdunensis NOT DETECTED NOT DETECTED Final   Streptococcus species NOT DETECTED NOT DETECTED Final   Streptococcus agalactiae NOT DETECTED NOT DETECTED Final   Streptococcus pneumoniae NOT DETECTED NOT DETECTED Final   Streptococcus pyogenes NOT DETECTED NOT DETECTED Final   A.calcoaceticus-baumannii NOT DETECTED NOT DETECTED Final   Bacteroides fragilis NOT DETECTED NOT DETECTED Final   Enterobacterales NOT DETECTED NOT DETECTED Final   Enterobacter cloacae complex NOT DETECTED NOT DETECTED Final   Escherichia  coli NOT DETECTED NOT DETECTED Final   Klebsiella aerogenes NOT DETECTED NOT DETECTED Final   Klebsiella oxytoca NOT DETECTED NOT DETECTED Final   Klebsiella pneumoniae NOT DETECTED NOT DETECTED Final   Proteus species NOT DETECTED NOT DETECTED Final   Salmonella species NOT DETECTED NOT DETECTED Final   Serratia marcescens NOT DETECTED NOT DETECTED Final   Haemophilus influenzae NOT DETECTED NOT DETECTED Final   Neisseria meningitidis NOT DETECTED NOT DETECTED Final   Pseudomonas aeruginosa NOT DETECTED NOT DETECTED Final   Stenotrophomonas maltophilia NOT DETECTED NOT DETECTED Final   Candida albicans NOT DETECTED NOT DETECTED Final   Candida auris NOT DETECTED NOT DETECTED Final   Candida glabrata NOT DETECTED NOT DETECTED Final   Candida krusei NOT DETECTED NOT DETECTED Final   Candida parapsilosis NOT DETECTED NOT DETECTED Final   Candida tropicalis NOT DETECTED NOT DETECTED Final   Cryptococcus neoformans/gattii NOT DETECTED NOT DETECTED Final   Methicillin resistance mecA/C DETECTED (A) NOT DETECTED Final    Comment: CRITICAL RESULT CALLED TO, READ BACK BY AND VERIFIED WITH: B MANCHERIL,PHARMD@0625  10/23/21 [place] Performed at [facility], [address] [place], [place] [address]   Culture, blood (routine x 2)     Status: None (Preliminary result)   Collection Time: 10/25/21  9:08 AM   Specimen: BLOOD LEFT HAND  Result Value Ref Range Status   Specimen Description BLOOD LEFT HAND  Final   Special Requests   Final    BOTTLES DRAWN AEROBIC ONLY Blood Culture results may not be optimal due to an inadequate volume of blood received in culture bottles  Culture   Final    NO GROWTH 2 DAYS Performed at [facility], [place] [address] [place], [place] [address]    Report Status PENDING  Incomplete  Culture, blood (routine x 2)     Status: None (Preliminary result)   Collection Time: 10/25/21  9:09 AM   Specimen: BLOOD RIGHT HAND  Result Value Ref Range Status    Specimen Description BLOOD RIGHT HAND  Final   Special Requests   Final    BOTTLES DRAWN AEROBIC ONLY Blood Culture results may not be optimal due to an inadequate volume of blood received in culture bottles   Culture   Final    NO GROWTH 2 DAYS Performed at [facility], [place] [address], [place], [place] [address]    Report Status PENDING  Incomplete        Radiology Studies: No results found.      Scheduled Meds:  amoxicillin  500 mg Oral Q8H   bacitracin   Topical BID   Chlorhexidine Gluconate Cloth  6 each Topical Daily   enoxaparin (LOVENOX) injection  40 mg Subcutaneous Q24H   feeding supplement  237 mL Oral BID BM   folic acid  1 mg Oral Daily   Gerhardt's butt cream   Topical TID   hydrocerin   Topical Daily   lactulose  10 g Oral Daily   magnesium oxide  400 mg Oral BID   multivitamin with minerals  1 tablet Oral Daily   nicotine  21 mg Transdermal Daily   nutrition supplement (JUVEN)  1 packet Oral BID BM   nystatin cream   Topical BID   Continuous Infusions:  magnesium sulfate bolus IVPB     ringers Stopped (10/26/21 1148)     LOS: 5 days    Time spent: 35 minutes    Simisola Sandles A Lael Wetherbee, MD [facility]   If 7PM-7AM, please contact night-coverage [email or web address]  10/27/2021, 1:58 PM

## 2021-10-27 NOTE — TOC Progression Note (Signed)
Transition of Care [facility]) - Progression Note    Patient Details  Name: Dylan Weaver MRN: [id number] Date of Birth: 01-09-1966  [facility] CM/SW Contact  Mearl Latin, LCSW Phone Number: 10/27/2021, 11:56 AM  Clinical Narrative:    [facility] unable to accept patient. CSW awaiting response from other [facility] contracted facilities.    Expected Discharge Plan: Skilled Nursing Facility Barriers to Discharge: SNF Covid, SNF Pending bed offer  Expected Discharge Plan and Services Expected Discharge Plan: Skilled Nursing Facility In-house Referral: Clinical Social Work   Post Acute Care Choice: Skilled Nursing Facility Living arrangements for the past 2 months: Single Family Home                                       Social Determinants of Health (SDOH) Interventions    Readmission Risk Interventions No flowsheet data found.

## 2021-10-28 DIAGNOSIS — L03119 Cellulitis of unspecified part of limb: Secondary | ICD-10-CM | POA: Diagnosis not present

## 2021-10-28 LAB — MAGNESIUM: Magnesium: 1.8 mg/dL (ref 1.7–2.4)

## 2021-10-28 LAB — BASIC METABOLIC PANEL
Anion gap: 7 (ref 5–15)
BUN: <5 mg/dL — ABNORMAL LOW (ref 6–20)
CO2: 24 mmol/L (ref 22–32)
Calcium: 8.1 mg/dL — ABNORMAL LOW (ref 8.9–10.3)
Chloride: 102 mmol/L (ref 98–111)
Creatinine, Ser: 0.63 mg/dL (ref 0.61–1.24)
GFR, Estimated: >60 mL/min (ref >60)
Glucose, Bld: 113 mg/dL — ABNORMAL HIGH (ref 70–99)
Potassium: 3.6 mmol/L (ref 3.5–5.1)
Sodium: 133 mmol/L — ABNORMAL LOW (ref 135–145)

## 2021-10-28 NOTE — Plan of Care (Signed)
  Problem: Nutrition: Goal: Adequate nutrition will be maintained Outcome: Progressing   Problem: Coping: Goal: Level of anxiety will decrease Outcome: Progressing   Problem: Activity: Goal: Risk for activity intolerance will decrease Outcome: Not Progressing   

## 2021-10-28 NOTE — Progress Notes (Signed)
PROGRESS NOTE    Dylan Weaver  [id number] DOB: April 23, 1966 DOA: 10/22/2021 PCP: [facility]   Brief Narrative: 56 year old past medical history significant for chronic alcohol abuse, alcoholic cirrhosis, malnutrition presented to ER after being found down on the floor.  Patient stated that he has been sleeping on the floor for the last 4 days on his back.  He has been unable to get off of the floor.  He has been urinating and defecating on himself.  His housekeeper came over  and called EMS.  Patient presented with lactic acid of 4.4, COVID-positive, total CK1490, white blood cell 13.  IV fluids lactate decreased to 2.3.  Patient was admitted with cellulitis, Rhabdomyolysis and sepsis with acute organ dysfunction.  CT abdomen and pelvis was negative for perineal abscess.    Assessment & Plan:   Principal Problem:   Cellulitis Active Problems:   Alcohol use disorder, severe, dependence (HCC)   Alcohol withdrawal (HCC)   Elevated LFTs   Tobacco abuse   Irritant contact dermatitis due to incontinence of both feces and urine   Rhabdomyolysis   Sepsis with acute organ dysfunction without septic shock (HCC)   Pressure injury of skin   Enterococcal bacteremia   1-Cellulitis of Bilateral thigh and Pubic Area;  -2/4 Blood culture positive for Stap epidermidis, and Enterococcus Raffinosus.  -Treated initially  with vancomycin and ceftriaxone. Transition to  Amoxicillin to complete 8 days.  -Repeated Blood Cultures 1/24: No growth to date.  -ID consulted. Recommend complete 7 days for enterococcus Bacteremia.  -Cellulitis resolving.    2-Alcohol use disorder, alcohol withdrawal: Completed  CIWA protocol. Continue with thiamine and folic acid. Stable.   Transaminases: Secondary to alcohol abuse and history of cirrhosis -Follow trend/.  -LFT trending down.   Tobacco abuse: We will need counseling.  Sepsis with acute organ dysfunction without septic  shock: secondary to cellulitis.  Presented with hypothermia, Tachypnea RR 26, Tachycardia , leukocytosis.  Sepsis POA> resolved/  Treated with fluids, and antibiotics.   Acute metabolic encephalopathy: Secondary to alcohol withdrawal and infectious process Ammonia 42, started lactulose daily.  Mental status improving.   Irritant contact dermatitis due to incontinence of both feces and urine: Continue with nystatin cream Improving.   Rhabdomyolysis: CK 1490--- decreased to 147 after hydration Treated with IV fluids  Hyponatremia: Treated with IV fluids resolved.  Hypokalemia: Replaced.   Hypomagnesemia: Mg 1.8. replete Magnesium/ orally.   Thrombocytopenia: Secondary to cirrhosis/ alcohol. Resolved.   COVID-positive: Chest x-ray negative, no hypoxic. Unable to provide significant history. Monotor  COVID-19 Labs Needs 10 days Quarantine.  No results for input(s): DDIMER, FERRITIN, LDH, CRP in the last 72 hours.  Lab Results  Component Value Date   [place] (A) 10/22/2021   [place] NEGATIVE 09/20/2021   [place] NEGATIVE 08/25/2021   [place] NEGATIVE 06/04/2021                                  See Wound documentation Below.      Pressure Injury 10/22/21 Toe (Comment  which one) Anterior;Left Unstageable - Full thickness tissue loss in which the base of the injury is covered by slough (yellow, tan, gray, green or brown) and/or eschar (tan, brown or black) in the wound bed. Right  (Active)  10/22/21 1640  Location: Toe (Comment  which one)  Location Orientation: Anterior;Left  Staging: Unstageable - Full thickness tissue loss in which the base  of the injury is covered by slough (yellow, tan, gray, green or brown) and/or eschar (tan, brown or black) in the wound bed.  Wound Description (Comments): Right great toe Black wound bed  Present on Admission: Yes     Pressure Injury 10/22/21 Toe (Comment  which one) Anterior;Left Unstageable  - Full thickness tissue loss in which the base of the injury is covered by slough (yellow, tan, gray, green or brown) and/or eschar (tan, brown or black) in the wound bed. left s (Active)  10/22/21 1640  Location: Toe (Comment  which one)  Location Orientation: Anterior;Left  Staging: Unstageable - Full thickness tissue loss in which the base of the injury is covered by slough (yellow, tan, gray, green or brown) and/or eschar (tan, brown or black) in the wound bed.  Wound Description (Comments): left second toe, black  Present on Admission: Yes     Pressure Injury 10/22/21 Toe (Comment  which one) Anterior;Right Unstageable - Full thickness tissue loss in which the base of the injury is covered by slough (yellow, tan, gray, green or brown) and/or eschar (tan, brown or black) in the wound bed. right (Active)  10/22/21 1600  Location: Toe (Comment  which one)  Location Orientation: Anterior;Right  Staging: Unstageable - Full thickness tissue loss in which the base of the injury is covered by slough (yellow, tan, gray, green or brown) and/or eschar (tan, brown or black) in the wound bed.  Wound Description (Comments): right second toe, black  Present on Admission: Yes     Pressure Injury 10/22/21 Penis Distal Stage 2 -  Partial thickness loss of dermis presenting as a shallow open injury with a red, pink wound bed without slough. (Active)  10/22/21 1640  Location: Penis  Location Orientation: Distal  Staging: Stage 2 -  Partial thickness loss of dermis presenting as a shallow open injury with a red, pink wound bed without slough.  Wound Description (Comments):   Present on Admission:      Nutrition Problem: Severe Malnutrition Etiology: social / environmental circumstances (EtOH abuse)    Signs/Symptoms: severe muscle depletion, severe fat depletion    Interventions: Ensure Enlive (each supplement provides 350kcal and 20 grams of protein), MVI  Estimated body mass index is 23.73  kg/m as calculated from the following:   Height as of 09/20/21: 6' (1.829 m).   Weight as of this encounter: 79.4 kg.   DVT prophylaxis: Heparin  Code Status: Full code Family Communication: Aunt over phone updated 1/24  Disposition Plan:  Status is: Inpatient  Remains inpatient appropriate because: encephalopathy, alcohol withdrawal. Awaiting SNF  Consultants:  None  Procedures:  None  Antimicrobials:    Subjective: No new complaints. Alert pleasant.   Objective: Vitals:   10/28/21 0034 10/28/21 0434 10/28/21 0900 10/28/21 1203  BP: 96/69 92/62 109/72 107/74  Pulse: 76 80 87 76  Resp: 14 16 19 20   Temp: (!) 97.4 F (36.3 C) 98.3 F (36.8 C) 98.3 F (36.8 C) 97.9 F (36.6 C)  TempSrc: Axillary Oral Oral Oral  SpO2: 92% 94% 95% 95%  Weight:        Intake/Output Summary (Last 24 hours) at 10/28/2021 1450 Last data filed at 10/28/2021 0500 Gross per 24 hour  Intake 640 ml  Output 1365 ml  Net -725 ml    Filed Weights   10/24/21 1100  Weight: 79.4 kg    Examination:  General exam: Thin appearing Respiratory system: CTA Cardiovascular system:  S 1, S 2 RRR Gastrointestinal  system: BS present, soft, nt Central nervous system: Alert Extremities: No edmea Skin: multiples bruise on his faces. BL thigh no significant redness, abrasions.    Data Reviewed: I have personally reviewed following labs and imaging studies  CBC: Recent Labs  Lab 10/22/21 0103 10/23/21 0049 10/24/21 0851 10/25/21 0210 10/26/21 0424  WBC 13.3* 13.2* 3.8* 4.5 4.8  NEUTROABS 10.7* 10.5*  --   --   --   HGB 16.7 11.6* 13.0 12.2* 11.4*  HCT 51.9 35.4* 40.3 36.4* 35.1*  MCV 99.4 96.2 96.9 93.8 95.9  PLT 85* 110* 106* 104* 0000000    Basic Metabolic Panel: Recent Labs  Lab 10/24/21 0851 10/25/21 0210 10/25/21 0909 10/26/21 0424 10/27/21 0844 10/28/21 0107  NA 142 138  --  132* 135 133*  K 3.5 3.7  --  3.2* 3.7 3.6  CL 109 109  --  102 104 102  CO2 23 21*  --  24 24 24    GLUCOSE 79 78  --  97 85 113*  BUN 6 9  --  7 <5* <5*  CREATININE 0.53* 0.53*  --  0.59* 0.47* 0.63  CALCIUM 8.3* 8.0*  --  8.0* 8.3* 8.1*  MG 1.6*  --  1.5* 1.4* 1.7 1.8    GFR: Estimated Creatinine Clearance: 114.5 mL/min (by C-G formula based on SCr of 0.63 mg/dL). Liver Function Tests: Recent Labs  Lab 10/22/21 0103 10/22/21 0731 10/25/21 0210 10/26/21 0424  AST 189* 120* 93* 65*  ALT 98* 67* 53* 46*  ALKPHOS 172* 123 128* 142*  BILITOT 1.9* 1.0 1.4* 0.6  PROT 9.7* 6.6 5.9* 5.9*  ALBUMIN 2.9* 2.0* 1.8* 1.7*    No results for input(s): LIPASE, AMYLASE in the last 168 hours. Recent Labs  Lab 10/24/21 1751  AMMONIA 42*    Coagulation Profile: Recent Labs  Lab 10/22/21 0334  INR 1.0    Cardiac Enzymes: Recent Labs  Lab 10/22/21 0103 10/22/21 1812 10/23/21 0049 10/24/21 0047  CKTOTAL 1,490* 934* 855* 347    BNP (last 3 results) No results for input(s): PROBNP in the last 8760 hours. HbA1C: No results for input(s): HGBA1C in the last 72 hours. CBG: No results for input(s): GLUCAP in the last 168 hours. Lipid Profile: No results for input(s): CHOL, HDL, LDLCALC, TRIG, CHOLHDL, LDLDIRECT in the last 72 hours. Thyroid Function Tests: No results for input(s): TSH, T4TOTAL, FREET4, T3FREE, THYROIDAB in the last 72 hours. Anemia Panel: No results for input(s): VITAMINB12, FOLATE, FERRITIN, TIBC, IRON, RETICCTPCT in the last 72 hours. Sepsis Labs: Recent Labs  Lab 10/22/21 0104 10/22/21 0300 10/23/21 0823 10/23/21 1140  LATICACIDVEN 4.4* 2.3* 1.4 0.9     Recent Results (from the past 240 hour(s))  Urine Culture     Status: None   Collection Time: 10/22/21 12:26 AM   Specimen: In/Out Cath Urine  Result Value Ref Range Status   Specimen Description IN/OUT CATH URINE  Final   Special Requests NONE  Final   Culture   Final    NO GROWTH Performed at [facility], [place] [address], [place], [place] [address]    Report Status 10/24/2021 FINAL   Final  Blood Culture (routine x 2)     Status: None   Collection Time: 10/22/21  1:03 AM   Specimen: BLOOD  Result Value Ref Range Status   Specimen Description BLOOD LEFT ANTECUBITAL  Final   Special Requests AEROBIC BOTTLE ONLY Blood Culture adequate volume  Final   Culture  Final    NO GROWTH 5 DAYS Performed at [facility], [place] [address] [place], [place] [address]    Report Status 10/27/2021 FINAL  Final  Resp Panel by RT-PCR (Flu A&B, Covid) Nasopharyngeal Swab     Status: Abnormal   Collection Time: 10/22/21  1:04 AM   Specimen: Nasopharyngeal Swab; Nasopharyngeal(NP) swabs in vial transport medium  Result Value Ref Range Status   SARS Coronavirus 2 by RT PCR POSITIVE (A) NEGATIVE Final    Comment: (NOTE) SARS-CoV-2 target nucleic acids are DETECTED.  The SARS-CoV-2 RNA is generally detectable in upper respiratory specimens during the acute phase of infection. Positive results are indicative of the presence of the identified virus, but do not rule out bacterial infection or co-infection with other pathogens not detected by the test. Clinical correlation with patient history and other diagnostic information is necessary to determine patient infection status. The expected result is Negative.  Fact Sheet for Patients: EntrepreneurPulse.com.au  Fact Sheet for Healthcare Providers: IncredibleEmployment.be  This test is not yet approved or cleared by the [place] FDA and  has been authorized for detection and/or diagnosis of SARS-CoV-2 by FDA under an Emergency Use Authorization (EUA).  This EUA will remain in effect (meaning this test can be used) for the duration of  the COVID-19 declaration under Section 564(b)(1) of the A ct, 21 U.S.C. section 360bbb-3(b)(1), unless the authorization is terminated or revoked sooner.     Influenza A by PCR NEGATIVE NEGATIVE Final   Influenza B by PCR NEGATIVE NEGATIVE Final    Comment:  (NOTE) The Xpert Xpress SARS-CoV-2/FLU/RSV plus assay is intended as an aid in the diagnosis of influenza from Nasopharyngeal swab specimens and should not be used as a sole basis for treatment. Nasal washings and aspirates are unacceptable for Xpert Xpress SARS-CoV-2/FLU/RSV testing.  Fact Sheet for Patients: EntrepreneurPulse.com.au  Fact Sheet for Healthcare Providers: IncredibleEmployment.be  This test is not yet approved or cleared by the [place] FDA and has been authorized for detection and/or diagnosis of SARS-CoV-2 by FDA under an Emergency Use Authorization (EUA). This EUA will remain in effect (meaning this test can be used) for the duration of the COVID-19 declaration under Section 564(b)(1) of the Act, 21 U.S.C. section 360bbb-3(b)(1), unless the authorization is terminated or revoked.  Performed at [facility], [place] [address], [place], [place] [address]   Blood Culture (routine x 2)     Status: Abnormal   Collection Time: 10/22/21  3:00 AM   Specimen: BLOOD  Result Value Ref Range Status   Specimen Description BLOOD RIGHT ANTECUBITAL  Final   Special Requests   Final    BOTTLES DRAWN AEROBIC AND ANAEROBIC Blood Culture adequate volume   Culture  Setup Time   Final    GRAM POSITIVE COCCI IN BOTH AEROBIC AND ANAEROBIC BOTTLES CRITICAL RESULT CALLED TO, READ BACK BY AND VERIFIED WITH: B MANCHERIL,PHARMD@0625  10/23/21 [place]    Culture (A)  Final    STAPHYLOCOCCUS HOMINIS ENTEROCOCCUS RAFFINOSUS CRITICAL RESULT CALLED TO, READ BACK BY AND VERIFIED WITH: PHARMD A UELAND [id number] AT 23 AM BY CM STAPHYLOCOCCUS EPIDERMIDIS THE SIGNIFICANCE OF ISOLATING THIS ORGANISM FROM A SINGLE SET OF BLOOD CULTURES WHEN MULTIPLE SETS ARE DRAWN IS UNCERTAIN. PLEASE NOTIFY THE MICROBIOLOGY DEPARTMENT WITHIN ONE WEEK IF SPECIATION AND SENSITIVITIES ARE REQUIRED. Performed at [facility], [place] [address] [place], [place] [address]     Report Status 10/25/2021 FINAL  Final   Organism ID, Bacteria  ENTEROCOCCUS RAFFINOSUS  Final      Susceptibility   Enterococcus raffinosus - MIC*    AMPICILLIN 8 SENSITIVE Sensitive     VANCOMYCIN 1 SENSITIVE Sensitive     GENTAMICIN SYNERGY SENSITIVE Sensitive     * ENTEROCOCCUS RAFFINOSUS  Blood Culture ID Panel (Reflexed)     Status: Abnormal   Collection Time: 10/22/21  3:00 AM  Result Value Ref Range Status   Enterococcus faecalis NOT DETECTED NOT DETECTED Final   Enterococcus Faecium NOT DETECTED NOT DETECTED Final   Listeria monocytogenes NOT DETECTED NOT DETECTED Final   Staphylococcus species DETECTED (A) NOT DETECTED Final    Comment: CRITICAL RESULT CALLED TO, READ BACK BY AND VERIFIED WITH: B MANCHERIL,PHARMD@0625  10/23/21 [place]    Staphylococcus aureus (BCID) NOT DETECTED NOT DETECTED Final   Staphylococcus epidermidis DETECTED (A) NOT DETECTED Final    Comment: Methicillin (oxacillin) resistant coagulase negative staphylococcus. Possible blood culture contaminant (unless isolated from more than one blood culture draw or clinical case suggests pathogenicity). No antibiotic treatment is indicated for blood  culture contaminants. CRITICAL RESULT CALLED TO, READ BACK BY AND VERIFIED WITH: B MANCHERIL,PHARMD@0625  10/23/21 [place]    Staphylococcus lugdunensis NOT DETECTED NOT DETECTED Final   Streptococcus species NOT DETECTED NOT DETECTED Final   Streptococcus agalactiae NOT DETECTED NOT DETECTED Final   Streptococcus pneumoniae NOT DETECTED NOT DETECTED Final   Streptococcus pyogenes NOT DETECTED NOT DETECTED Final   A.calcoaceticus-baumannii NOT DETECTED NOT DETECTED Final   Bacteroides fragilis NOT DETECTED NOT DETECTED Final   Enterobacterales NOT DETECTED NOT DETECTED Final   Enterobacter cloacae complex NOT DETECTED NOT DETECTED Final   Escherichia coli NOT DETECTED NOT DETECTED Final   Klebsiella aerogenes NOT DETECTED NOT DETECTED Final   Klebsiella oxytoca NOT DETECTED  NOT DETECTED Final   Klebsiella pneumoniae NOT DETECTED NOT DETECTED Final   Proteus species NOT DETECTED NOT DETECTED Final   Salmonella species NOT DETECTED NOT DETECTED Final   Serratia marcescens NOT DETECTED NOT DETECTED Final   Haemophilus influenzae NOT DETECTED NOT DETECTED Final   Neisseria meningitidis NOT DETECTED NOT DETECTED Final   Pseudomonas aeruginosa NOT DETECTED NOT DETECTED Final   Stenotrophomonas maltophilia NOT DETECTED NOT DETECTED Final   Candida albicans NOT DETECTED NOT DETECTED Final   Candida auris NOT DETECTED NOT DETECTED Final   Candida glabrata NOT DETECTED NOT DETECTED Final   Candida krusei NOT DETECTED NOT DETECTED Final   Candida parapsilosis NOT DETECTED NOT DETECTED Final   Candida tropicalis NOT DETECTED NOT DETECTED Final   Cryptococcus neoformans/gattii NOT DETECTED NOT DETECTED Final   Methicillin resistance mecA/C DETECTED (A) NOT DETECTED Final    Comment: CRITICAL RESULT CALLED TO, READ BACK BY AND VERIFIED WITH: B MANCHERIL,PHARMD@0625  10/23/21 [place] Performed at [facility], [address], [place], [place] [address]   Culture, blood (routine x 2)     Status: None (Preliminary result)   Collection Time: 10/25/21  9:08 AM   Specimen: BLOOD LEFT HAND  Result Value Ref Range Status   Specimen Description BLOOD LEFT HAND  Final   Special Requests   Final    BOTTLES DRAWN AEROBIC ONLY Blood Culture results may not be optimal due to an inadequate volume of blood received in culture bottles   Culture   Final    NO GROWTH 3 DAYS Performed at [facility], [place] [address], [place], [place] [address]    Report Status PENDING  Incomplete  Culture, blood (routine x 2)  Status: None (Preliminary result)   Collection Time: 10/25/21  9:09 AM   Specimen: BLOOD RIGHT HAND  Result Value Ref Range Status   Specimen Description BLOOD RIGHT HAND  Final   Special Requests   Final    BOTTLES DRAWN AEROBIC ONLY Blood Culture results may not  be optimal due to an inadequate volume of blood received in culture bottles   Culture   Final    NO GROWTH 3 DAYS Performed at [facility], [place] [address] [place], [place] [address]    Report Status PENDING  Incomplete        Radiology Studies: No results found.      Scheduled Meds:  amoxicillin  500 mg Oral Q8H   bacitracin   Topical BID   Chlorhexidine Gluconate Cloth  6 each Topical Daily   enoxaparin (LOVENOX) injection  40 mg Subcutaneous Q24H   feeding supplement  237 mL Oral BID BM   folic acid  1 mg Oral Daily   Gerhardt's butt cream   Topical TID   hydrocerin   Topical Daily   lactulose  10 g Oral Daily   magnesium oxide  400 mg Oral BID   multivitamin with minerals  1 tablet Oral Daily   nicotine  21 mg Transdermal Daily   nutrition supplement (JUVEN)  1 packet Oral BID BM   nystatin cream   Topical BID   Continuous Infusions:     LOS: 6 days    Time spent: 35 minutes    Neziah Vogelgesang A Azilee Pirro, MD [facility]   If 7PM-7AM, please contact night-coverage [email or web address]  10/28/2021, 2:50 PM

## 2021-10-28 NOTE — TOC Progression Note (Addendum)
Transition of Care [facility]) - Progression Note    Patient Details  Name: Dylan Weaver MRN: [id number] Date of Birth: November 07, 1965  [facility] CM/SW Contact  Mearl Latin, LCSW Phone Number: 10/28/2021, 9:53 AM  Clinical Narrative:    CSW spoke with Dennie Bible at [facility] [place] and [facility] with [facility]. Both are still reviewing referral. [facility] still [facility] not [facility] beds.         Update: [facility] declined patient.                       Expected Discharge Plan: Skilled Nursing Facility Barriers to Discharge: SNF Covid, SNF Pending bed offer  Expected Discharge Plan and Services Expected Discharge Plan: Skilled Nursing Facility In-house Referral: Clinical Social Work   Post Acute Care Choice: Skilled Nursing Facility Living arrangements for the past 2 months: Single Family Home                                       Social Determinants of Health (SDOH) Interventions    Readmission Risk Interventions No flowsheet data found.

## 2021-10-28 NOTE — Progress Notes (Signed)
Physical Therapy Treatment Patient Details Name: Dylan Weaver MRN: [id number] DOB: [birth year] Today's Date: 10/28/2021   History of Present Illness 56 year old male presented to the ER 10/22/21 after being found down on the floor. Had been unable to get off the floor x 4 days when housekeeper found him. +incontinence of urine and feces with irritant contact dermatitis over bil anterior, medial thighs and perineum; sepsis; +CIWA  PMH chronic alcohol abuse, alcoholic cirrhosis, malnutrition,    PT Comments    Pt making progress with mobility. Continues to demonstrate cognitive as well as mobility deficits. Continue to recommend SNF.    Recommendations for follow up therapy are one component of a multi-disciplinary discharge planning process, led by the attending physician.  Recommendations may be updated based on patient status, additional functional criteria and insurance authorization.  Follow Up Recommendations  Skilled nursing-short term rehab (<3 hours/day)     Assistance Recommended at Discharge Frequent or constant Supervision/Assistance  Patient can return home with the following A little help with walking and/or transfers;Help with stairs or ramp for entrance;Assist for transportation;Direct supervision/assist for medications management;Assistance with cooking/housework;Direct supervision/assist for financial management;A little help with bathing/dressing/bathroom   Equipment Recommendations  Rolling walker (2 wheels)    Recommendations for Other Services       Precautions / Restrictions Precautions Precautions: Fall;Other (comment) Precaution Comments: poor safety awareness     Mobility  Bed Mobility Overal bed mobility: Needs Assistance Bed Mobility: Supine to Sit, Sit to Supine     Supine to sit: Supervision Sit to supine: Supervision   General bed mobility comments: Assist for safety    Transfers Overall transfer level: Needs assistance Equipment  used: 1 person hand held assist, Rolling walker (2 wheels) Transfers: Sit to/from Stand Sit to Stand: Min assist           General transfer comment: Assist to bring hips up and for balance. Pt pulling up on walker despite verbal/tactile cues    Ambulation/Gait Ambulation/Gait assistance: Min assist Gait Distance (Feet): 20 Feet Assistive device: Rolling walker (2 wheels) Gait Pattern/deviations: Step-through pattern, Decreased step length - right, Decreased step length - left, Narrow base of support Gait velocity: decr Gait velocity interpretation: 1.31 - 2.62 ft/sec, indicative of limited community ambulator   General Gait Details: Assist for balance due to instability   Stairs             Wheelchair Mobility    Modified Rankin (Stroke Patients Only)       Balance Overall balance assessment: Needs assistance Sitting-balance support: No upper extremity supported, Feet supported Sitting balance-Leahy Scale: Fair     Standing balance support: Bilateral upper extremity supported, During functional activity Standing balance-Leahy Scale: Poor Standing balance comment: walker and min assist for static standing                            Cognition Arousal/Alertness: Awake/alert Behavior During Therapy: Flat affect, Impulsive Overall Cognitive Status: No family/caregiver present to determine baseline cognitive functioning Area of Impairment: Orientation, Memory, Safety/judgement, Awareness, Problem solving                 Orientation Level: Disoriented to, Time   Memory: Decreased short-term memory   Safety/Judgement: Decreased awareness of safety, Decreased awareness of deficits   Problem Solving: Requires verbal cues          Exercises      General Comments  Pertinent Vitals/Pain Pain Assessment Pain Assessment: Faces Faces Pain Scale: Hurts a little bit Pain Location: legs and buttocks Pain Descriptors / Indicators:  Sore Pain Intervention(s): Limited activity within patient's tolerance    Home Living                          Prior Function            PT Goals (current goals can now be found in the care plan section) Acute Rehab PT Goals Patient Stated Goal: go home Progress towards PT goals: Progressing toward goals    Frequency    Min 2X/week      PT Plan Current plan remains appropriate    Co-evaluation              AM-PAC PT "6 Clicks" Mobility   Outcome Measure  Help needed turning from your back to your side while in a flat bed without using bedrails?: A Little Help needed moving from lying on your back to sitting on the side of a flat bed without using bedrails?: A Little Help needed moving to and from a bed to a chair (including a wheelchair)?: A Little Help needed standing up from a chair using your arms (e.g., wheelchair or bedside chair)?: A Little Help needed to walk in hospital room?: A Little Help needed climbing 3-5 steps with a railing? : Total 6 Click Score: 16    End of Session Equipment Utilized During Treatment: Gait belt Activity Tolerance: Patient tolerated treatment well Patient left: in bed;with call bell/phone within reach;with bed alarm set Nurse Communication: Mobility status PT Visit Diagnosis: Unsteadiness on feet (R26.81);Difficulty in walking, not elsewhere classified (R26.2)     Time: 2440-1027 PT Time Calculation (min) (ACUTE ONLY): 19 min  Charges:  $Gait Training: 8-22 mins                     [facility] PT Acute Rehabilitation Services Pager [phone] Office [phone]    Angelina Ok [facility] 10/28/2021, 5:18 PM

## 2021-10-29 DIAGNOSIS — L03119 Cellulitis of unspecified part of limb: Secondary | ICD-10-CM | POA: Diagnosis not present

## 2021-10-29 MED ORDER — SODIUM CHLORIDE 0.9 % IV BOLUS
500.0000 mL | Freq: Once | INTRAVENOUS | Status: AC
Start: 2021-10-29 — End: 2021-10-29
  Administered 2021-10-29: 500 mL via INTRAVENOUS

## 2021-10-29 NOTE — Plan of Care (Signed)

## 2021-10-29 NOTE — Progress Notes (Signed)
PROGRESS NOTE    Dylan Weaver  [id number] DOB: May 18, 1966 DOA: 10/22/2021 PCP: [facility]   Brief Narrative: 56 year old past medical history significant for chronic alcohol abuse, alcoholic cirrhosis, malnutrition presented to ER after being found down on the floor.  Patient stated that he has been sleeping on the floor for the last 4 days on his back.  He has been unable to get off of the floor.  He has been urinating and defecating on himself.  His housekeeper came over  and called EMS.  Patient presented with lactic acid of 4.4, COVID-positive, total CK1490, white blood cell 13.  IV fluids lactate decreased to 2.3.  Patient was admitted with cellulitis, Rhabdomyolysis and sepsis with acute organ dysfunction.  CT abdomen and pelvis was negative for perineal abscess.    Assessment & Plan:   Principal Problem:   Cellulitis Active Problems:   Alcohol use disorder, severe, dependence (HCC)   Alcohol withdrawal (HCC)   Elevated LFTs   Tobacco abuse   Irritant contact dermatitis due to incontinence of both feces and urine   Rhabdomyolysis   Sepsis with acute organ dysfunction without septic shock (HCC)   Pressure injury of skin   Enterococcal bacteremia   1-Cellulitis of Bilateral thigh and Pubic Area;  -2/4 Blood culture positive for Stap epidermidis, and Enterococcus Raffinosus.  -Treated initially  with vancomycin and ceftriaxone. Completed 8 days  treatment of Amoxicillin.  -Repeated Blood Cultures 1/24: No growth to date.  -ID consulted. Recommend complete 7 days for enterococcus Bacteremia.  -Cellulitis resolving.    2-Alcohol use disorder, alcohol withdrawal: Completed  CIWA protocol. Continue with thiamine and folic acid. Stable.   Transaminases: Secondary to alcohol abuse and history of cirrhosis -Follow trend/.  -LFT trending down.   Tobacco abuse: We will need counseling.  Sepsis with acute organ dysfunction without septic shock:  secondary to cellulitis.  Presented with hypothermia, Tachypnea RR 26, Tachycardia , leukocytosis.  Sepsis POA> resolved/  Treated with fluids, and antibiotics.   Acute metabolic encephalopathy: Secondary to alcohol withdrawal and infectious process Ammonia 42, started lactulose daily.  Mental status improving.   Irritant contact dermatitis due to incontinence of both feces and urine: Continue with nystatin cream Improving.   Rhabdomyolysis: CK 1490--- decreased to 147 after hydration Treated with IV fluids  Hyponatremia: Treated with IV fluids resolved.  Hypokalemia: Replaced.   Hypomagnesemia: Mg 1.8. replete Magnesium/ orally.   Thrombocytopenia: Secondary to cirrhosis/ alcohol. Resolved.   COVID-positive: Chest x-ray negative, no hypoxic. Unable to provide significant history. Monotor  COVID-19 Labs Needs 10 days Quarantine. Last day of Quarantine: 1/30.  No results for input(s): DDIMER, FERRITIN, LDH, CRP in the last 72 hours.  Lab Results  Component Value Date   [place] (A) 10/22/2021   [place] NEGATIVE 09/20/2021   [place] NEGATIVE 08/25/2021   [place] NEGATIVE 06/04/2021                                  See Wound documentation Below.      Pressure Injury 10/22/21 Toe (Comment  which one) Anterior;Left Unstageable - Full thickness tissue loss in which the base of the injury is covered by slough (yellow, tan, gray, green or brown) and/or eschar (tan, brown or black) in the wound bed. Right  (Active)  10/22/21 1640  Location: Toe (Comment  which one)  Location Orientation: Anterior;Left  Staging: Unstageable - Full thickness tissue loss  in which the base of the injury is covered by slough (yellow, tan, gray, green or brown) and/or eschar (tan, brown or black) in the wound bed.  Wound Description (Comments): Right great toe Black wound bed  Present on Admission: Yes     Pressure Injury 10/22/21 Toe (Comment  which one)  Anterior;Left Unstageable - Full thickness tissue loss in which the base of the injury is covered by slough (yellow, tan, gray, green or brown) and/or eschar (tan, brown or black) in the wound bed. left s (Active)  10/22/21 1640  Location: Toe (Comment  which one)  Location Orientation: Anterior;Left  Staging: Unstageable - Full thickness tissue loss in which the base of the injury is covered by slough (yellow, tan, gray, green or brown) and/or eschar (tan, brown or black) in the wound bed.  Wound Description (Comments): left second toe, black  Present on Admission: Yes     Pressure Injury 10/22/21 Toe (Comment  which one) Anterior;Right Unstageable - Full thickness tissue loss in which the base of the injury is covered by slough (yellow, tan, gray, green or brown) and/or eschar (tan, brown or black) in the wound bed. right (Active)  10/22/21 1600  Location: Toe (Comment  which one)  Location Orientation: Anterior;Right  Staging: Unstageable - Full thickness tissue loss in which the base of the injury is covered by slough (yellow, tan, gray, green or brown) and/or eschar (tan, brown or black) in the wound bed.  Wound Description (Comments): right second toe, black  Present on Admission: Yes     Pressure Injury 10/22/21 Penis Distal Stage 2 -  Partial thickness loss of dermis presenting as a shallow open injury with a red, pink wound bed without slough. (Active)  10/22/21 1640  Location: Penis  Location Orientation: Distal  Staging: Stage 2 -  Partial thickness loss of dermis presenting as a shallow open injury with a red, pink wound bed without slough.  Wound Description (Comments):   Present on Admission:      Nutrition Problem: Severe Malnutrition Etiology: social / environmental circumstances (EtOH abuse)    Signs/Symptoms: severe muscle depletion, severe fat depletion    Interventions: Ensure Enlive (each supplement provides 350kcal and 20 grams of protein), MVI  Estimated  body mass index is 23.73 kg/m as calculated from the following:   Height as of 09/20/21: 6' (1.829 m).   Weight as of this encounter: 79.4 kg.   DVT prophylaxis: Heparin  Code Status: Full code Family Communication: Aunt over phone updated 1/24  Disposition Plan:  Status is: Inpatient  Remains inpatient appropriate because: encephalopathy, alcohol withdrawal. Awaiting SNF  Consultants:  None  Procedures:  None  Antimicrobials:    Subjective: Denies pain., no new complaints   Objective: Vitals:   10/28/21 1923 10/28/21 2355 10/29/21 0323 10/29/21 0813  BP: (!) 90/53 100/61 97/67 (!) 87/60  Pulse: 79 75 79 77  Resp: 18 20 18 16   Temp: 97.9 F (36.6 C) 97.9 F (36.6 C) 98.1 F (36.7 C) 97.6 F (36.4 C)  TempSrc: Oral Oral Oral Oral  SpO2: 96% 98% 96% 97%  Weight:        Intake/Output Summary (Last 24 hours) at 10/29/2021 [id number] Last data filed at 10/29/2021 [id number] Gross per 24 hour  Intake 711 ml  Output 800 ml  Net -89 ml    Filed Weights   10/24/21 1100  Weight: 79.4 kg    Examination:  General exam: Thin appearing Respiratory system: CTA Cardiovascular system: S 1,  S 2 RRR Gastrointestinal system: BS present, soft, nt Central nervous system: Alert Extremities: No edema Skin: multiples bruise on his faces. BL thigh no significant redness, abrasions.    Data Reviewed: I have personally reviewed following labs and imaging studies  CBC: Recent Labs  Lab 10/23/21 0049 10/24/21 0851 10/25/21 0210 10/26/21 0424  WBC 13.2* 3.8* 4.5 4.8  NEUTROABS 10.5*  --   --   --   HGB 11.6* 13.0 12.2* 11.4*  HCT 35.4* 40.3 36.4* 35.1*  MCV 96.2 96.9 93.8 95.9  PLT 110* 106* 104* 0000000    Basic Metabolic Panel: Recent Labs  Lab 10/24/21 0851 10/25/21 0210 10/25/21 0909 10/26/21 0424 10/27/21 0844 10/28/21 0107  NA 142 138  --  132* 135 133*  K 3.5 3.7  --  3.2* 3.7 3.6  CL 109 109  --  102 104 102  CO2 23 21*  --  24 24 24   GLUCOSE 79 78  --  97 85  113*  BUN 6 9  --  7 <5* <5*  CREATININE 0.53* 0.53*  --  0.59* 0.47* 0.63  CALCIUM 8.3* 8.0*  --  8.0* 8.3* 8.1*  MG 1.6*  --  1.5* 1.4* 1.7 1.8    GFR: Estimated Creatinine Clearance: 114.5 mL/min (by C-G formula based on SCr of 0.63 mg/dL). Liver Function Tests: Recent Labs  Lab 10/25/21 0210 10/26/21 0424  AST 93* 65*  ALT 53* 46*  ALKPHOS 128* 142*  BILITOT 1.4* 0.6  PROT 5.9* 5.9*  ALBUMIN 1.8* 1.7*    No results for input(s): LIPASE, AMYLASE in the last 168 hours. Recent Labs  Lab 10/24/21 1751  AMMONIA 42*    Coagulation Profile: No results for input(s): INR, PROTIME in the last 168 hours.  Cardiac Enzymes: Recent Labs  Lab 10/22/21 1812 10/23/21 0049 10/24/21 0047  CKTOTAL 934* 855* 347    BNP (last 3 results) No results for input(s): PROBNP in the last 8760 hours. HbA1C: No results for input(s): HGBA1C in the last 72 hours. CBG: No results for input(s): GLUCAP in the last 168 hours. Lipid Profile: No results for input(s): CHOL, HDL, LDLCALC, TRIG, CHOLHDL, LDLDIRECT in the last 72 hours. Thyroid Function Tests: No results for input(s): TSH, T4TOTAL, FREET4, T3FREE, THYROIDAB in the last 72 hours. Anemia Panel: No results for input(s): VITAMINB12, FOLATE, FERRITIN, TIBC, IRON, RETICCTPCT in the last 72 hours. Sepsis Labs: Recent Labs  Lab 10/23/21 0823 10/23/21 1140  LATICACIDVEN 1.4 0.9     Recent Results (from the past 240 hour(s))  Urine Culture     Status: None   Collection Time: 10/22/21 12:26 AM   Specimen: In/Out Cath Urine  Result Value Ref Range Status   Specimen Description IN/OUT CATH URINE  Final   Special Requests NONE  Final   Culture   Final    NO GROWTH Performed at [facility], [place] [address], [place], [place] [address]    Report Status 10/24/2021 FINAL  Final  Blood Culture (routine x 2)     Status: None   Collection Time: 10/22/21  1:03 AM   Specimen: BLOOD  Result Value Ref Range Status   Specimen  Description BLOOD LEFT ANTECUBITAL  Final   Special Requests AEROBIC BOTTLE ONLY Blood Culture adequate volume  Final   Culture   Final    NO GROWTH 5 DAYS Performed at [place] [address], [place], [place] [address]    Report Status 10/27/2021 FINAL  Final  Resp Panel by RT-PCR (Flu A&B, Covid) Nasopharyngeal Swab     Status: Abnormal   Collection Time: 10/22/21  1:04 AM   Specimen: Nasopharyngeal Swab; Nasopharyngeal(NP) swabs in vial transport medium  Result Value Ref Range Status   SARS Coronavirus 2 by RT PCR POSITIVE (A) NEGATIVE Final    Comment: (NOTE) SARS-CoV-2 target nucleic acids are DETECTED.  The SARS-CoV-2 RNA is generally detectable in upper respiratory specimens during the acute phase of infection. Positive results are indicative of the presence of the identified virus, but do not rule out bacterial infection or co-infection with other pathogens not detected by the test. Clinical correlation with patient history and other diagnostic information is necessary to determine patient infection status. The expected result is Negative.  Fact Sheet for Patients: EntrepreneurPulse.com.au  Fact Sheet for Healthcare Providers: IncredibleEmployment.be  This test is not yet approved or cleared by the [place] FDA and  has been authorized for detection and/or diagnosis of SARS-CoV-2 by FDA under an Emergency Use Authorization (EUA).  This EUA will remain in effect (meaning this test can be used) for the duration of  the COVID-19 declaration under Section 564(b)(1) of the A ct, 21 U.S.C. section 360bbb-3(b)(1), unless the authorization is terminated or revoked sooner.     Influenza A by PCR NEGATIVE NEGATIVE Final   Influenza B by PCR NEGATIVE NEGATIVE Final    Comment: (NOTE) The Xpert Xpress SARS-CoV-2/FLU/RSV plus assay is intended as an aid in the diagnosis of influenza from Nasopharyngeal swab specimens and should  not be used as a sole basis for treatment. Nasal washings and aspirates are unacceptable for Xpert Xpress SARS-CoV-2/FLU/RSV testing.  Fact Sheet for Patients: EntrepreneurPulse.com.au  Fact Sheet for Healthcare Providers: IncredibleEmployment.be  This test is not yet approved or cleared by the [place] FDA and has been authorized for detection and/or diagnosis of SARS-CoV-2 by FDA under an Emergency Use Authorization (EUA). This EUA will remain in effect (meaning this test can be used) for the duration of the COVID-19 declaration under Section 564(b)(1) of the Act, 21 U.S.C. section 360bbb-3(b)(1), unless the authorization is terminated or revoked.  Performed at [facility], [place] [address], [place], [place] [address]   Blood Culture (routine x 2)     Status: Abnormal   Collection Time: 10/22/21  3:00 AM   Specimen: BLOOD  Result Value Ref Range Status   Specimen Description BLOOD RIGHT ANTECUBITAL  Final   Special Requests   Final    BOTTLES DRAWN AEROBIC AND ANAEROBIC Blood Culture adequate volume   Culture  Setup Time   Final    GRAM POSITIVE COCCI IN BOTH AEROBIC AND ANAEROBIC BOTTLES CRITICAL RESULT CALLED TO, READ BACK BY AND VERIFIED WITH: B MANCHERIL,PHARMD@0625  10/23/21 [place]    Culture (A)  Final    STAPHYLOCOCCUS HOMINIS ENTEROCOCCUS RAFFINOSUS CRITICAL RESULT CALLED TO, READ BACK BY AND VERIFIED WITH: PHARMD A UELAND [id number] AT 67 AM BY CM STAPHYLOCOCCUS EPIDERMIDIS THE SIGNIFICANCE OF ISOLATING THIS ORGANISM FROM A SINGLE SET OF BLOOD CULTURES WHEN MULTIPLE SETS ARE DRAWN IS UNCERTAIN. PLEASE NOTIFY THE MICROBIOLOGY DEPARTMENT WITHIN ONE WEEK IF SPECIATION AND SENSITIVITIES ARE REQUIRED. Performed at [facility], [place] [address], [place], [place] [address]    Report Status 10/25/2021 FINAL  Final   Organism ID, Bacteria ENTEROCOCCUS RAFFINOSUS  Final      Susceptibility   Enterococcus raffinosus - MIC*     AMPICILLIN 8 SENSITIVE Sensitive     VANCOMYCIN 1  SENSITIVE Sensitive     GENTAMICIN SYNERGY SENSITIVE Sensitive     * ENTEROCOCCUS RAFFINOSUS  Blood Culture ID Panel (Reflexed)     Status: Abnormal   Collection Time: 10/22/21  3:00 AM  Result Value Ref Range Status   Enterococcus faecalis NOT DETECTED NOT DETECTED Final   Enterococcus Faecium NOT DETECTED NOT DETECTED Final   Listeria monocytogenes NOT DETECTED NOT DETECTED Final   Staphylococcus species DETECTED (A) NOT DETECTED Final    Comment: CRITICAL RESULT CALLED TO, READ BACK BY AND VERIFIED WITH: B MANCHERIL,PHARMD@0625  10/23/21 [place]    Staphylococcus aureus (BCID) NOT DETECTED NOT DETECTED Final   Staphylococcus epidermidis DETECTED (A) NOT DETECTED Final    Comment: Methicillin (oxacillin) resistant coagulase negative staphylococcus. Possible blood culture contaminant (unless isolated from more than one blood culture draw or clinical case suggests pathogenicity). No antibiotic treatment is indicated for blood  culture contaminants. CRITICAL RESULT CALLED TO, READ BACK BY AND VERIFIED WITH: B MANCHERIL,PHARMD@0625  10/23/21 [place]    Staphylococcus lugdunensis NOT DETECTED NOT DETECTED Final   Streptococcus species NOT DETECTED NOT DETECTED Final   Streptococcus agalactiae NOT DETECTED NOT DETECTED Final   Streptococcus pneumoniae NOT DETECTED NOT DETECTED Final   Streptococcus pyogenes NOT DETECTED NOT DETECTED Final   A.calcoaceticus-baumannii NOT DETECTED NOT DETECTED Final   Bacteroides fragilis NOT DETECTED NOT DETECTED Final   Enterobacterales NOT DETECTED NOT DETECTED Final   Enterobacter cloacae complex NOT DETECTED NOT DETECTED Final   Escherichia coli NOT DETECTED NOT DETECTED Final   Klebsiella aerogenes NOT DETECTED NOT DETECTED Final   Klebsiella oxytoca NOT DETECTED NOT DETECTED Final   Klebsiella pneumoniae NOT DETECTED NOT DETECTED Final   Proteus species NOT DETECTED NOT DETECTED Final   Salmonella species NOT  DETECTED NOT DETECTED Final   Serratia marcescens NOT DETECTED NOT DETECTED Final   Haemophilus influenzae NOT DETECTED NOT DETECTED Final   Neisseria meningitidis NOT DETECTED NOT DETECTED Final   Pseudomonas aeruginosa NOT DETECTED NOT DETECTED Final   Stenotrophomonas maltophilia NOT DETECTED NOT DETECTED Final   Candida albicans NOT DETECTED NOT DETECTED Final   Candida auris NOT DETECTED NOT DETECTED Final   Candida glabrata NOT DETECTED NOT DETECTED Final   Candida krusei NOT DETECTED NOT DETECTED Final   Candida parapsilosis NOT DETECTED NOT DETECTED Final   Candida tropicalis NOT DETECTED NOT DETECTED Final   Cryptococcus neoformans/gattii NOT DETECTED NOT DETECTED Final   Methicillin resistance mecA/C DETECTED (A) NOT DETECTED Final    Comment: CRITICAL RESULT CALLED TO, READ BACK BY AND VERIFIED WITH: B MANCHERIL,PHARMD@0625  10/23/21 [place] Performed at [facility], [address] [place], [place] [address]   Culture, blood (routine x 2)     Status: None (Preliminary result)   Collection Time: 10/25/21  9:08 AM   Specimen: BLOOD LEFT HAND  Result Value Ref Range Status   Specimen Description BLOOD LEFT HAND  Final   Special Requests   Final    BOTTLES DRAWN AEROBIC ONLY Blood Culture results may not be optimal due to an inadequate volume of blood received in culture bottles   Culture   Final    NO GROWTH 3 DAYS Performed at [facility], [place] [address] [place], [place] [address]    Report Status PENDING  Incomplete  Culture, blood (routine x 2)     Status: None (Preliminary result)   Collection Time: 10/25/21  9:09 AM   Specimen: BLOOD RIGHT HAND  Result Value Ref Range Status  Specimen Description BLOOD RIGHT HAND  Final   Special Requests   Final    BOTTLES DRAWN AEROBIC ONLY Blood Culture results may not be optimal due to an inadequate volume of blood received in culture bottles   Culture   Final    NO GROWTH 3 DAYS Performed at [facility],  [place] [address], [place], [place] [address]    Report Status PENDING  Incomplete        Radiology Studies: No results found.      Scheduled Meds:  bacitracin   Topical BID   Chlorhexidine Gluconate Cloth  6 each Topical Daily   enoxaparin (LOVENOX) injection  40 mg Subcutaneous Q24H   feeding supplement  237 mL Oral BID BM   folic acid  1 mg Oral Daily   Gerhardt's butt cream   Topical TID   lactulose  10 g Oral Daily   magnesium oxide  400 mg Oral BID   multivitamin with minerals  1 tablet Oral Daily   nicotine  21 mg Transdermal Daily   nutrition supplement (JUVEN)  1 packet Oral BID BM   nystatin cream   Topical BID   Continuous Infusions:  sodium chloride        LOS: 7 days    Time spent: 35 minutes    Andi [place]field A Demonica Farrey, MD [facility]   If 7PM-7AM, please contact night-coverage [email or web address]  10/29/2021, 8:22 AM

## 2021-10-30 DIAGNOSIS — L03119 Cellulitis of unspecified part of limb: Secondary | ICD-10-CM | POA: Diagnosis not present

## 2021-10-30 LAB — CULTURE, BLOOD (ROUTINE X 2)
Culture: NO GROWTH
Culture: NO GROWTH

## 2021-10-30 LAB — BASIC METABOLIC PANEL
Anion gap: 10 (ref 5–15)
BUN: 7 mg/dL (ref 6–20)
CO2: 21 mmol/L — ABNORMAL LOW (ref 22–32)
Calcium: 8.7 mg/dL — ABNORMAL LOW (ref 8.9–10.3)
Chloride: 99 mmol/L (ref 98–111)
Creatinine, Ser: 0.65 mg/dL (ref 0.61–1.24)
GFR, Estimated: >60 mL/min (ref >60)
Glucose, Bld: 121 mg/dL — ABNORMAL HIGH (ref 70–99)
Potassium: 4.2 mmol/L (ref 3.5–5.1)
Sodium: 130 mmol/L — ABNORMAL LOW (ref 135–145)

## 2021-10-30 LAB — CORTISOL-AM, BLOOD: Cortisol - AM: 5.4 ug/dL — ABNORMAL LOW (ref 6.7–22.6)

## 2021-10-30 MED ORDER — LACTATED RINGERS IV BOLUS
500.0000 mL | Freq: Once | INTRAVENOUS | Status: AC
  Administered 2021-10-30: 500 mL via INTRAVENOUS

## 2021-10-30 MED ORDER — LACTATED RINGERS IV SOLN: INTRAVENOUS | Status: DC

## 2021-10-30 MED ORDER — COSYNTROPIN 0.25 MG IJ SOLR
0.2500 mg | Freq: Once | INTRAMUSCULAR | Status: AC
  Administered 2021-10-31: 0.25 mg via INTRAVENOUS
  Filled 2021-10-30: qty 0.25

## 2021-10-30 NOTE — Progress Notes (Signed)
PROGRESS NOTE    Dylan Weaver  [id number] DOB: January 23, 1966 DOA: 10/22/2021 PCP: [facility]   Brief Narrative: 56 year old past medical history significant for chronic alcohol abuse, alcoholic cirrhosis, malnutrition presented to ER after being found down on the floor.  Patient stated that he has been sleeping on the floor for the last 4 days on his back.  He has been unable to get off of the floor.  He has been urinating and defecating on himself.  His housekeeper came over  and called EMS.  Patient presented with lactic acid of 4.4, COVID-positive, total CK1490, white blood cell 13.  IV fluids lactate decreased to 2.3.  Patient was admitted with cellulitis, Rhabdomyolysis and sepsis with acute organ dysfunction.  CT abdomen and pelvis was negative for perineal abscess.    Assessment & Plan:   Principal Problem:   Cellulitis Active Problems:   Alcohol use disorder, severe, dependence (HCC)   Alcohol withdrawal (HCC)   Elevated LFTs   Tobacco abuse   Irritant contact dermatitis due to incontinence of both feces and urine   Rhabdomyolysis   Sepsis with acute organ dysfunction without septic shock (HCC)   Pressure injury of skin   Enterococcal bacteremia   1-Cellulitis of Bilateral thigh and Pubic Area;  -2/4 Blood culture positive for Stap epidermidis, and Enterococcus Raffinosus.  -Treated initially  with vancomycin and ceftriaxone. Completed 8 days  treatment of Amoxicillin.  -Repeated Blood Cultures 1/24: No growth to date.  -ID consulted. Recommend complete 7 days for enterococcus Bacteremia.  -Cellulitis resolved.    2-Alcohol use disorder, alcohol withdrawal: Completed  CIWA protocol. Continue with thiamine and folic acid. Stable.   Transaminases: Secondary to alcohol abuse and history of cirrhosis -Follow trend/.  -LFT trending down.   Tobacco abuse: We will need counseling.  Sepsis with acute organ dysfunction without septic shock:  secondary to cellulitis.  Presented with hypothermia, Tachypnea RR 26, Tachycardia , leukocytosis.  Sepsis POA> resolved/  Treated with fluids, and antibiotics.   Acute metabolic encephalopathy: Secondary to alcohol withdrawal and infectious process Ammonia 42, started lactulose daily.  Mental status improving.   Irritant contact dermatitis due to incontinence of both feces and urine: Continue with nystatin cream Improving.   Hypotension;  Improved with IV fluids.  Will continue with IV fluids.  Cortisol low at 5. Will proceed with Cosyntropin test.   Rhabdomyolysis: CK 1490--- decreased to 147 after hydration Treated with IV fluids  Hyponatremia: Treated with IV fluids resolved.  Hypokalemia: Replaced.   Hypomagnesemia: Mg 1.8. replete Magnesium/ orally.   Thrombocytopenia: Secondary to cirrhosis/ alcohol. Resolved.   COVID-positive: Chest x-ray negative, no hypoxic. Unable to provide significant history. Monotor  COVID-19 Labs Needs 10 days Quarantine. Last day of Quarantine: 1/30.  No results for input(s): DDIMER, FERRITIN, LDH, CRP in the last 72 hours.  Lab Results  Component Value Date   [place] (A) 10/22/2021   [place] NEGATIVE 09/20/2021   [place] NEGATIVE 08/25/2021   [place] NEGATIVE 06/04/2021                                  See Wound documentation Below.      Pressure Injury 10/22/21 Toe (Comment  which one) Anterior;Left Unstageable - Full thickness tissue loss in which the base of the injury is covered by slough (yellow, tan, gray, green or brown) and/or eschar (tan, brown or black) in the wound bed. Right  (  Active)  10/22/21 1640  Location: Toe (Comment  which one)  Location Orientation: Anterior;Left  Staging: Unstageable - Full thickness tissue loss in which the base of the injury is covered by slough (yellow, tan, gray, green or brown) and/or eschar (tan, brown or black) in the wound bed.  Wound  Description (Comments): Right great toe Black wound bed  Present on Admission: Yes     Pressure Injury 10/22/21 Toe (Comment  which one) Anterior;Left Unstageable - Full thickness tissue loss in which the base of the injury is covered by slough (yellow, tan, gray, green or brown) and/or eschar (tan, brown or black) in the wound bed. left s (Active)  10/22/21 1640  Location: Toe (Comment  which one)  Location Orientation: Anterior;Left  Staging: Unstageable - Full thickness tissue loss in which the base of the injury is covered by slough (yellow, tan, gray, green or brown) and/or eschar (tan, brown or black) in the wound bed.  Wound Description (Comments): left second toe, black  Present on Admission: Yes     Pressure Injury 10/22/21 Toe (Comment  which one) Anterior;Right Unstageable - Full thickness tissue loss in which the base of the injury is covered by slough (yellow, tan, gray, green or brown) and/or eschar (tan, brown or black) in the wound bed. right (Active)  10/22/21 1600  Location: Toe (Comment  which one)  Location Orientation: Anterior;Right  Staging: Unstageable - Full thickness tissue loss in which the base of the injury is covered by slough (yellow, tan, gray, green or brown) and/or eschar (tan, brown or black) in the wound bed.  Wound Description (Comments): right second toe, black  Present on Admission: Yes     Pressure Injury 10/22/21 Penis Distal Stage 2 -  Partial thickness loss of dermis presenting as a shallow open injury with a red, pink wound bed without slough. (Active)  10/22/21 1640  Location: Penis  Location Orientation: Distal  Staging: Stage 2 -  Partial thickness loss of dermis presenting as a shallow open injury with a red, pink wound bed without slough.  Wound Description (Comments):   Present on Admission:      Nutrition Problem: Severe Malnutrition Etiology: social / environmental circumstances (EtOH abuse)    Signs/Symptoms: severe muscle  depletion, severe fat depletion    Interventions: Ensure Enlive (each supplement provides 350kcal and 20 grams of protein), MVI  Estimated body mass index is 23.73 kg/m as calculated from the following:   Height as of 09/20/21: 6' (1.829 m).   Weight as of this encounter: 79.4 kg.   DVT prophylaxis: Heparin  Code Status: Full code Family Communication: Aunt over phone updated 1/24  Disposition Plan:  Status is: Inpatient  Remains inpatient appropriate because: encephalopathy, alcohol withdrawal. Awaiting SNF  Consultants:  None  Procedures:  None  Antimicrobials:    Subjective: He report feeling ok, he went to bathroom and fell. Did not hit hard.   Objective: Vitals:   10/29/21 1200 10/29/21 2100 10/30/21 0320 10/30/21 0803  BP: 93/71 92/66 98/76  (!) 89/65  Pulse:  79 70 74  Resp:  18 18 18   Temp:  98.4 F (36.9 C) 98.2 F (36.8 C) 98.1 F (36.7 C)  TempSrc:  Oral Oral Oral  SpO2:  92% 96%   Weight:        Intake/Output Summary (Last 24 hours) at 10/30/2021 0935 Last data filed at 10/30/2021 0300 Gross per 24 hour  Intake 1200 ml  Output 2025 ml  Net -825 ml  Filed Weights   10/24/21 1100  Weight: 79.4 kg    Examination:  General exam: Thin appearing.  Respiratory system: CTA Cardiovascular system: S 1, S 2 RRR Gastrointestinal system: BS present, soft, nt Central nervous system: Alert, follows command Extremities: No edema Skin: rash LE improved Data Reviewed: I have personally reviewed following labs and imaging studies  CBC: Recent Labs  Lab 10/24/21 0851 10/25/21 0210 10/26/21 0424  WBC 3.8* 4.5 4.8  HGB 13.0 12.2* 11.4*  HCT 40.3 36.4* 35.1*  MCV 96.9 93.8 95.9  PLT 106* 104* 0000000    Basic Metabolic Panel: Recent Labs  Lab 10/24/21 0851 10/25/21 0210 10/25/21 0909 10/26/21 0424 10/27/21 0844 10/28/21 0107  NA 142 138  --  132* 135 133*  K 3.5 3.7  --  3.2* 3.7 3.6  CL 109 109  --  102 104 102  CO2 23 21*  --  24 24 24    GLUCOSE 79 78  --  97 85 113*  BUN 6 9  --  7 <5* <5*  CREATININE 0.53* 0.53*  --  0.59* 0.47* 0.63  CALCIUM 8.3* 8.0*  --  8.0* 8.3* 8.1*  MG 1.6*  --  1.5* 1.4* 1.7 1.8    GFR: Estimated Creatinine Clearance: 114.5 mL/min (by C-G formula based on SCr of 0.63 mg/dL). Liver Function Tests: Recent Labs  Lab 10/25/21 0210 10/26/21 0424  AST 93* 65*  ALT 53* 46*  ALKPHOS 128* 142*  BILITOT 1.4* 0.6  PROT 5.9* 5.9*  ALBUMIN 1.8* 1.7*    No results for input(s): LIPASE, AMYLASE in the last 168 hours. Recent Labs  Lab 10/24/21 1751  AMMONIA 42*    Coagulation Profile: No results for input(s): INR, PROTIME in the last 168 hours.  Cardiac Enzymes: Recent Labs  Lab 10/24/21 0047  CKTOTAL 347    BNP (last 3 results) No results for input(s): PROBNP in the last 8760 hours. HbA1C: No results for input(s): HGBA1C in the last 72 hours. CBG: No results for input(s): GLUCAP in the last 168 hours. Lipid Profile: No results for input(s): CHOL, HDL, LDLCALC, TRIG, CHOLHDL, LDLDIRECT in the last 72 hours. Thyroid Function Tests: No results for input(s): TSH, T4TOTAL, FREET4, T3FREE, THYROIDAB in the last 72 hours. Anemia Panel: No results for input(s): VITAMINB12, FOLATE, FERRITIN, TIBC, IRON, RETICCTPCT in the last 72 hours. Sepsis Labs: Recent Labs  Lab 10/23/21 1140  LATICACIDVEN 0.9     Recent Results (from the past 240 hour(s))  Urine Culture     Status: None   Collection Time: 10/22/21 12:26 AM   Specimen: In/Out Cath Urine  Result Value Ref Range Status   Specimen Description IN/OUT CATH URINE  Final   Special Requests NONE  Final   Culture   Final    NO GROWTH Performed at [facility], [place] [address] [place], [place] [address]    Report Status 10/24/2021 FINAL  Final  Blood Culture (routine x 2)     Status: None   Collection Time: 10/22/21  1:03 AM   Specimen: BLOOD  Result Value Ref Range Status   Specimen Description BLOOD LEFT ANTECUBITAL   Final   Special Requests AEROBIC BOTTLE ONLY Blood Culture adequate volume  Final   Culture   Final    NO GROWTH 5 DAYS Performed at [facility] [address], [place], [place] [address]    Report Status 10/27/2021 FINAL  Final  Resp Panel by RT-PCR (Flu A&B, Covid) Nasopharyngeal  Swab     Status: Abnormal   Collection Time: 10/22/21  1:04 AM   Specimen: Nasopharyngeal Swab; Nasopharyngeal(NP) swabs in vial transport medium  Result Value Ref Range Status   SARS Coronavirus 2 by RT PCR POSITIVE (A) NEGATIVE Final    Comment: (NOTE) SARS-CoV-2 target nucleic acids are DETECTED.  The SARS-CoV-2 RNA is generally detectable in upper respiratory specimens during the acute phase of infection. Positive results are indicative of the presence of the identified virus, but do not rule out bacterial infection or co-infection with other pathogens not detected by the test. Clinical correlation with patient history and other diagnostic information is necessary to determine patient infection status. The expected result is Negative.  Fact Sheet for Patients: EntrepreneurPulse.com.au  Fact Sheet for Healthcare Providers: IncredibleEmployment.be  This test is not yet approved or cleared by the [place] FDA and  has been authorized for detection and/or diagnosis of SARS-CoV-2 by FDA under an Emergency Use Authorization (EUA).  This EUA will remain in effect (meaning this test can be used) for the duration of  the COVID-19 declaration under Section 564(b)(1) of the A ct, 21 U.S.C. section 360bbb-3(b)(1), unless the authorization is terminated or revoked sooner.     Influenza A by PCR NEGATIVE NEGATIVE Final   Influenza B by PCR NEGATIVE NEGATIVE Final    Comment: (NOTE) The Xpert Xpress SARS-CoV-2/FLU/RSV plus assay is intended as an aid in the diagnosis of influenza from Nasopharyngeal swab specimens and should not be used as a sole basis for  treatment. Nasal washings and aspirates are unacceptable for Xpert Xpress SARS-CoV-2/FLU/RSV testing.  Fact Sheet for Patients: EntrepreneurPulse.com.au  Fact Sheet for Healthcare Providers: IncredibleEmployment.be  This test is not yet approved or cleared by the [place] FDA and has been authorized for detection and/or diagnosis of SARS-CoV-2 by FDA under an Emergency Use Authorization (EUA). This EUA will remain in effect (meaning this test can be used) for the duration of the COVID-19 declaration under Section 564(b)(1) of the Act, 21 U.S.C. section 360bbb-3(b)(1), unless the authorization is terminated or revoked.  Performed at [facility], [place] [address], [place], [place] [address]   Blood Culture (routine x 2)     Status: Abnormal   Collection Time: 10/22/21  3:00 AM   Specimen: BLOOD  Result Value Ref Range Status   Specimen Description BLOOD RIGHT ANTECUBITAL  Final   Special Requests   Final    BOTTLES DRAWN AEROBIC AND ANAEROBIC Blood Culture adequate volume   Culture  Setup Time   Final    GRAM POSITIVE COCCI IN BOTH AEROBIC AND ANAEROBIC BOTTLES CRITICAL RESULT CALLED TO, READ BACK BY AND VERIFIED WITH: B MANCHERIL,PHARMD@0625  10/23/21 [place]    Culture (A)  Final    STAPHYLOCOCCUS HOMINIS ENTEROCOCCUS RAFFINOSUS CRITICAL RESULT CALLED TO, READ BACK BY AND VERIFIED WITH: PHARMD A UELAND [id number] AT 38 AM BY CM STAPHYLOCOCCUS EPIDERMIDIS THE SIGNIFICANCE OF ISOLATING THIS ORGANISM FROM A SINGLE SET OF BLOOD CULTURES WHEN MULTIPLE SETS ARE DRAWN IS UNCERTAIN. PLEASE NOTIFY THE MICROBIOLOGY DEPARTMENT WITHIN ONE WEEK IF SPECIATION AND SENSITIVITIES ARE REQUIRED. Performed at [facility], [place] [address] [place],  [address]    Report Status 10/25/2021 FINAL  Final   Organism ID, Bacteria ENTEROCOCCUS RAFFINOSUS  Final      Susceptibility   Enterococcus raffinosus - MIC*    AMPICILLIN 8 SENSITIVE Sensitive      VANCOMYCIN 1 SENSITIVE Sensitive     GENTAMICIN SYNERGY SENSITIVE Sensitive     *  ENTEROCOCCUS RAFFINOSUS  Blood Culture ID Panel (Reflexed)     Status: Abnormal   Collection Time: 10/22/21  3:00 AM  Result Value Ref Range Status   Enterococcus faecalis NOT DETECTED NOT DETECTED Final   Enterococcus Faecium NOT DETECTED NOT DETECTED Final   Listeria monocytogenes NOT DETECTED NOT DETECTED Final   Staphylococcus species DETECTED (A) NOT DETECTED Final    Comment: CRITICAL RESULT CALLED TO, READ BACK BY AND VERIFIED WITH: B MANCHERIL,PHARMD@0625  10/23/21 [place]    Staphylococcus aureus (BCID) NOT DETECTED NOT DETECTED Final   Staphylococcus epidermidis DETECTED (A) NOT DETECTED Final    Comment: Methicillin (oxacillin) resistant coagulase negative staphylococcus. Possible blood culture contaminant (unless isolated from more than one blood culture draw or clinical case suggests pathogenicity). No antibiotic treatment is indicated for blood  culture contaminants. CRITICAL RESULT CALLED TO, READ BACK BY AND VERIFIED WITH: B MANCHERIL,PHARMD@0625  10/23/21 [place]    Staphylococcus lugdunensis NOT DETECTED NOT DETECTED Final   Streptococcus species NOT DETECTED NOT DETECTED Final   Streptococcus agalactiae NOT DETECTED NOT DETECTED Final   Streptococcus pneumoniae NOT DETECTED NOT DETECTED Final   Streptococcus pyogenes NOT DETECTED NOT DETECTED Final   A.calcoaceticus-baumannii NOT DETECTED NOT DETECTED Final   Bacteroides fragilis NOT DETECTED NOT DETECTED Final   Enterobacterales NOT DETECTED NOT DETECTED Final   Enterobacter cloacae complex NOT DETECTED NOT DETECTED Final   Escherichia coli NOT DETECTED NOT DETECTED Final   Klebsiella aerogenes NOT DETECTED NOT DETECTED Final   Klebsiella oxytoca NOT DETECTED NOT DETECTED Final   Klebsiella pneumoniae NOT DETECTED NOT DETECTED Final   Proteus species NOT DETECTED NOT DETECTED Final   Salmonella species NOT DETECTED NOT DETECTED Final    Serratia marcescens NOT DETECTED NOT DETECTED Final   Haemophilus influenzae NOT DETECTED NOT DETECTED Final   Neisseria meningitidis NOT DETECTED NOT DETECTED Final   Pseudomonas aeruginosa NOT DETECTED NOT DETECTED Final   Stenotrophomonas maltophilia NOT DETECTED NOT DETECTED Final   Candida albicans NOT DETECTED NOT DETECTED Final   Candida auris NOT DETECTED NOT DETECTED Final   Candida glabrata NOT DETECTED NOT DETECTED Final   Candida krusei NOT DETECTED NOT DETECTED Final   Candida parapsilosis NOT DETECTED NOT DETECTED Final   Candida tropicalis NOT DETECTED NOT DETECTED Final   Cryptococcus neoformans/gattii NOT DETECTED NOT DETECTED Final   Methicillin resistance mecA/C DETECTED (A) NOT DETECTED Final    Comment: CRITICAL RESULT CALLED TO, READ BACK BY AND VERIFIED WITH: B MANCHERIL,PHARMD@0625  10/23/21 [place] Performed at [facility], [address], [place], [place] [address]   Culture, blood (routine x 2)     Status: None   Collection Time: 10/25/21  9:08 AM   Specimen: BLOOD LEFT HAND  Result Value Ref Range Status   Specimen Description BLOOD LEFT HAND  Final   Special Requests   Final    BOTTLES DRAWN AEROBIC ONLY Blood Culture results may not be optimal due to an inadequate volume of blood received in culture bottles   Culture   Final    NO GROWTH 5 DAYS Performed at [facility], [place] [address], [place], [place] [address]    Report Status 10/30/2021 FINAL  Final  Culture, blood (routine x 2)     Status: None   Collection Time: 10/25/21  9:09 AM   Specimen: BLOOD RIGHT HAND  Result Value Ref Range Status   Specimen Description BLOOD RIGHT HAND  Final   Special Requests   Final    BOTTLES  DRAWN AEROBIC ONLY Blood Culture results may not be optimal due to an inadequate volume of blood received in culture bottles   Culture   Final    NO GROWTH 5 DAYS Performed at [facility], [place] [address], [place], [place] [address]    Report Status 10/30/2021  FINAL  Final        Radiology Studies: No results found.      Scheduled Meds:  bacitracin   Topical BID   Chlorhexidine Gluconate Cloth  6 each Topical Daily   enoxaparin (LOVENOX) injection  40 mg Subcutaneous Q24H   feeding supplement  237 mL Oral BID BM   folic acid  1 mg Oral Daily   Gerhardt's butt cream   Topical TID   lactulose  10 g Oral Daily   magnesium oxide  400 mg Oral BID   multivitamin with minerals  1 tablet Oral Daily   nicotine  21 mg Transdermal Daily   nutrition supplement (JUVEN)  1 packet Oral BID BM   nystatin cream   Topical BID   Continuous Infusions:  lactated ringers     lactated ringers        LOS: 8 days    Time spent: 35 minutes    Kaisen Ackers A Annie Roseboom, MD [facility]   If 7PM-7AM, please contact night-coverage [email or web address]  10/30/2021, 9:35 AM

## 2021-10-31 DIAGNOSIS — L03119 Cellulitis of unspecified part of limb: Secondary | ICD-10-CM | POA: Diagnosis not present

## 2021-10-31 LAB — ACTH STIMULATION, 3 TIME POINTS
Cortisol, 30 Min: 19.4 ug/dL
Cortisol, 60 Min: 24.9 ug/dL
Cortisol, Base: 7.4 ug/dL

## 2021-10-31 NOTE — TOC Progression Note (Signed)
Transition of Care [facility]) - Progression Note    Patient Details  Name: Dylan Weaver MRN: [id number] Date of Birth: Dec 18, 1965  [facility] CM/SW Contact  Mearl Latin, LCSW Phone Number: 10/31/2021, 4:14 PM  Clinical Narrative:    Patient continues without SNF bed offers. CSW will continue to contact facilities.    Expected Discharge Plan: Skilled Nursing Facility Barriers to Discharge: SNF Covid, SNF Pending bed offer  Expected Discharge Plan and Services Expected Discharge Plan: Skilled Nursing Facility In-house Referral: Clinical Social Work   Post Acute Care Choice: Skilled Nursing Facility Living arrangements for the past 2 months: Single Family Home                                       Social Determinants of Health (SDOH) Interventions    Readmission Risk Interventions No flowsheet data found.

## 2021-10-31 NOTE — Progress Notes (Signed)
PROGRESS NOTE    Dylan Weaver  [id number] DOB: 04-06-1966 DOA: 10/22/2021 PCP: [facility]   Brief Narrative: 56 year old past medical history significant for chronic alcohol abuse, alcoholic cirrhosis, malnutrition presented to ER after being found down on the floor.  Patient stated that he has been sleeping on the floor for the last 4 days on his back.  He has been unable to get off of the floor.  He has been urinating and defecating on himself.  His housekeeper came over  and called EMS.  Patient presented with lactic acid of 4.4, COVID-positive, total CK1490, white blood cell 13.  IV fluids lactate decreased to 2.3.  Patient was admitted with cellulitis, Rhabdomyolysis and sepsis with acute organ dysfunction.  CT abdomen and pelvis was negative for perineal abscess.    Assessment & Plan:   Principal Problem:   Cellulitis Active Problems:   Alcohol use disorder, severe, dependence (HCC)   Alcohol withdrawal (HCC)   Elevated LFTs   Tobacco abuse   Irritant contact dermatitis due to incontinence of both feces and urine   Rhabdomyolysis   Sepsis with acute organ dysfunction without septic shock (HCC)   Pressure injury of skin   Enterococcal bacteremia   1-Cellulitis of Bilateral thigh and Pubic Area;  -2/4 Blood culture positive for Stap epidermidis, and Enterococcus Raffinosus.  -Treated initially  with vancomycin and ceftriaxone. Completed 8 days  treatment of Amoxicillin.  -Repeated Blood Cultures 1/24: No growth to date.  -ID consulted. Recommend complete 7 days for enterococcus Bacteremia.  -Cellulitis resolved.    2-Alcohol use disorder, alcohol withdrawal: Completed  CIWA protocol. Continue with thiamine and folic acid. Stable.   Transaminases: Secondary to alcohol abuse and history of cirrhosis -Follow trend/.  -LFT trending down.   Tobacco abuse: We will need counseling.  Sepsis with acute organ dysfunction without septic shock:  secondary to cellulitis.  Presented with hypothermia, Tachypnea RR 26, Tachycardia , leukocytosis.  Sepsis POA> resolved/  Treated with fluids, and antibiotics.   Acute metabolic encephalopathy: Secondary to alcohol withdrawal and infectious process Ammonia 42, started lactulose daily.  Mental status improving.   Irritant contact dermatitis due to incontinence of both feces and urine: Continue with nystatin cream Improving.   Hypotension;  Improved with IV fluids.  Cortisol low at 5.  Cosyntropin test negative for  adrenal insufficiency.   Rhabdomyolysis: CK 1490--- decreased to 147 after hydration Treated with IV fluids  Hyponatremia: Treated with IV fluids resolved.  Hypokalemia: Replaced.   Hypomagnesemia: Mg 1.8. replete Magnesium/ orally.   Thrombocytopenia: Secondary to cirrhosis/ alcohol. Resolved.   COVID-positive: Chest x-ray negative, no hypoxic. Unable to provide significant history. Monotor  COVID-19 Labs Needs 10 days Quarantine. Last day of Quarantine: 1/30.  No results for input(s): DDIMER, FERRITIN, LDH, CRP in the last 72 hours.  Lab Results  Component Value Date   [place] (A) 10/22/2021   [place] NEGATIVE 09/20/2021   [place] NEGATIVE 08/25/2021   [place] NEGATIVE 06/04/2021                                  See Wound documentation Below.      Pressure Injury 10/22/21 Toe (Comment  which one) Anterior;Left Unstageable - Full thickness tissue loss in which the base of the injury is covered by slough (yellow, tan, gray, green or brown) and/or eschar (tan, brown or black) in the wound bed. Right  (Active)  10/22/21 1640  Location: Toe (Comment  which one)  Location Orientation: Anterior;Left  Staging: Unstageable - Full thickness tissue loss in which the base of the injury is covered by slough (yellow, tan, gray, green or brown) and/or eschar (tan, brown or black) in the wound bed.  Wound Description (Comments):  Right great toe Black wound bed  Present on Admission: Yes     Pressure Injury 10/22/21 Toe (Comment  which one) Anterior;Left Unstageable - Full thickness tissue loss in which the base of the injury is covered by slough (yellow, tan, gray, green or brown) and/or eschar (tan, brown or black) in the wound bed. left s (Active)  10/22/21 1640  Location: Toe (Comment  which one)  Location Orientation: Anterior;Left  Staging: Unstageable - Full thickness tissue loss in which the base of the injury is covered by slough (yellow, tan, gray, green or brown) and/or eschar (tan, brown or black) in the wound bed.  Wound Description (Comments): left second toe, black  Present on Admission: Yes     Pressure Injury 10/22/21 Toe (Comment  which one) Anterior;Right Unstageable - Full thickness tissue loss in which the base of the injury is covered by slough (yellow, tan, gray, green or brown) and/or eschar (tan, brown or black) in the wound bed. right (Active)  10/22/21 1600  Location: Toe (Comment  which one)  Location Orientation: Anterior;Right  Staging: Unstageable - Full thickness tissue loss in which the base of the injury is covered by slough (yellow, tan, gray, green or brown) and/or eschar (tan, brown or black) in the wound bed.  Wound Description (Comments): right second toe, black  Present on Admission: Yes     Pressure Injury 10/22/21 Penis Distal Stage 2 -  Partial thickness loss of dermis presenting as a shallow open injury with a red, pink wound bed without slough. (Active)  10/22/21 1640  Location: Penis  Location Orientation: Distal  Staging: Stage 2 -  Partial thickness loss of dermis presenting as a shallow open injury with a red, pink wound bed without slough.  Wound Description (Comments):   Present on Admission:      Nutrition Problem: Severe Malnutrition Etiology: social / environmental circumstances (EtOH abuse)    Signs/Symptoms: severe muscle depletion, severe fat  depletion    Interventions: Ensure Enlive (each supplement provides 350kcal and 20 grams of protein), MVI, Juven  Estimated body mass index is 23.73 kg/m as calculated from the following:   Height as of 09/20/21: 6' (1.829 m).   Weight as of this encounter: 79.4 kg.   DVT prophylaxis: Heparin  Code Status: Full code Family Communication: Aunt over phone updated 1/24  Disposition Plan:  Status is: Inpatient  Remains inpatient appropriate because: encephalopathy, alcohol withdrawal. Awaiting SNF. Off Quarantine 1/31  Consultants:  None  Procedures:  None  Antimicrobials:    Subjective: He is feeling well, no new complaints.   Objective: Vitals:   10/30/21 1938 10/31/21 0646 10/31/21 0825 10/31/21 1158  BP:  91/64 93/62 98/72   Pulse:  81 72 78  Resp: 16 20 16 16   Temp:  98.6 F (37 C) 98 F (36.7 C) 98 F (36.7 C)  TempSrc:  Oral Oral Oral  SpO2:  95% 99% 98%  Weight:        Intake/Output Summary (Last 24 hours) at 10/31/2021 1413 Last data filed at 10/31/2021 1158 Gross per 24 hour  Intake 2071.06 ml  Output 225 ml  Net 1846.06 ml    [employer]  10/24/21 1100  Weight: 79.4 kg    Examination:  General exam: Thin appearing Respiratory system: CTA Cardiovascular system: S 1, S 2 RRR Gastrointestinal system: BS present, soft, nt Central nervous system: Alert, follows command Extremities: No edema Skin: rash LE improved  Data Reviewed: I have personally reviewed following labs and imaging studies  CBC: Recent Labs  Lab 10/25/21 0210 10/26/21 0424  WBC 4.5 4.8  HGB 12.2* 11.4*  HCT 36.4* 35.1*  MCV 93.8 95.9  PLT 104* 0000000    Basic Metabolic Panel: Recent Labs  Lab 10/25/21 0210 10/25/21 0909 10/26/21 0424 10/27/21 0844 10/28/21 0107 10/30/21 1012  NA 138  --  132* 135 133* 130*  K 3.7  --  3.2* 3.7 3.6 4.2  CL 109  --  102 104 102 99  CO2 21*  --  24 24 24  21*  GLUCOSE 78  --  97 85 113* 121*  BUN 9  --  7 <5* <5* 7   CREATININE 0.53*  --  0.59* 0.47* 0.63 0.65  CALCIUM 8.0*  --  8.0* 8.3* 8.1* 8.7*  MG  --  1.5* 1.4* 1.7 1.8  --     GFR: Estimated Creatinine Clearance: 114.5 mL/min (by C-G formula based on SCr of 0.65 mg/dL). Liver Function Tests: Recent Labs  Lab 10/25/21 0210 10/26/21 0424  AST 93* 65*  ALT 53* 46*  ALKPHOS 128* 142*  BILITOT 1.4* 0.6  PROT 5.9* 5.9*  ALBUMIN 1.8* 1.7*    No results for input(s): LIPASE, AMYLASE in the last 168 hours. Recent Labs  Lab 10/24/21 1751  AMMONIA 42*    Coagulation Profile: No results for input(s): INR, PROTIME in the last 168 hours.  Cardiac Enzymes: No results for input(s): CKTOTAL, CKMB, CKMBINDEX, TROPONINI in the last 168 hours.  BNP (last 3 results) No results for input(s): PROBNP in the last 8760 hours. HbA1C: No results for input(s): HGBA1C in the last 72 hours. CBG: No results for input(s): GLUCAP in the last 168 hours. Lipid Profile: No results for input(s): CHOL, HDL, LDLCALC, TRIG, CHOLHDL, LDLDIRECT in the last 72 hours. Thyroid Function Tests: No results for input(s): TSH, T4TOTAL, FREET4, T3FREE, THYROIDAB in the last 72 hours. Anemia Panel: No results for input(s): VITAMINB12, FOLATE, FERRITIN, TIBC, IRON, RETICCTPCT in the last 72 hours. Sepsis Labs: No results for input(s): PROCALCITON, LATICACIDVEN in the last 168 hours.   Recent Results (from the past 240 hour(s))  Urine Culture     Status: None   Collection Time: 10/22/21 12:26 AM   Specimen: In/Out Cath Urine  Result Value Ref Range Status   Specimen Description IN/OUT CATH URINE  Final   Special Requests NONE  Final   Culture   Final    NO GROWTH Performed at [facility], [address] [place], [place] [address]    Report Status 10/24/2021 FINAL  Final  Blood Culture (routine x 2)     Status: None   Collection Time: 10/22/21  1:03 AM   Specimen: BLOOD  Result Value Ref Range Status   Specimen Description BLOOD LEFT ANTECUBITAL  Final    Special Requests AEROBIC BOTTLE ONLY Blood Culture adequate volume  Final   Culture   Final    NO GROWTH 5 DAYS Performed at [facility] [address] [place], [place] [address]    Report Status 10/27/2021 FINAL  Final  Resp Panel by RT-PCR (Flu A&B, Covid) Nasopharyngeal Swab     Status: Abnormal  Collection Time: 10/22/21  1:04 AM   Specimen: Nasopharyngeal Swab; Nasopharyngeal(NP) swabs in vial transport medium  Result Value Ref Range Status   SARS Coronavirus 2 by RT PCR POSITIVE (A) NEGATIVE Final    Comment: (NOTE) SARS-CoV-2 target nucleic acids are DETECTED.  The SARS-CoV-2 RNA is generally detectable in upper respiratory specimens during the acute phase of infection. Positive results are indicative of the presence of the identified virus, but do not rule out bacterial infection or co-infection with other pathogens not detected by the test. Clinical correlation with patient history and other diagnostic information is necessary to determine patient infection status. The expected result is Negative.  Fact Sheet for Patients: EntrepreneurPulse.com.au  Fact Sheet for Healthcare Providers: IncredibleEmployment.be  This test is not yet approved or cleared by the [place] FDA and  has been authorized for detection and/or diagnosis of SARS-CoV-2 by FDA under an Emergency Use Authorization (EUA).  This EUA will remain in effect (meaning this test can be used) for the duration of  the COVID-19 declaration under Section 564(b)(1) of the A ct, 21 U.S.C. section 360bbb-3(b)(1), unless the authorization is terminated or revoked sooner.     Influenza A by PCR NEGATIVE NEGATIVE Final   Influenza B by PCR NEGATIVE NEGATIVE Final    Comment: (NOTE) The Xpert Xpress SARS-CoV-2/FLU/RSV plus assay is intended as an aid in the diagnosis of influenza from Nasopharyngeal swab specimens and should not be used as a sole basis for  treatment. Nasal washings and aspirates are unacceptable for Xpert Xpress SARS-CoV-2/FLU/RSV testing.  Fact Sheet for Patients: EntrepreneurPulse.com.au  Fact Sheet for Healthcare Providers: IncredibleEmployment.be  This test is not yet approved or cleared by the [place] FDA and has been authorized for detection and/or diagnosis of SARS-CoV-2 by FDA under an Emergency Use Authorization (EUA). This EUA will remain in effect (meaning this test can be used) for the duration of the COVID-19 declaration under Section 564(b)(1) of the Act, 21 U.S.C. section 360bbb-3(b)(1), unless the authorization is terminated or revoked.  Performed at [facility], [place] [address], [place], [place] [address]   Blood Culture (routine x 2)     Status: Abnormal   Collection Time: 10/22/21  3:00 AM   Specimen: BLOOD  Result Value Ref Range Status   Specimen Description BLOOD RIGHT ANTECUBITAL  Final   Special Requests   Final    BOTTLES DRAWN AEROBIC AND ANAEROBIC Blood Culture adequate volume   Culture  Setup Time   Final    GRAM POSITIVE COCCI IN BOTH AEROBIC AND ANAEROBIC BOTTLES CRITICAL RESULT CALLED TO, READ BACK BY AND VERIFIED WITH: B MANCHERIL,PHARMD@0625  10/23/21 [place]    Culture (A)  Final    STAPHYLOCOCCUS HOMINIS ENTEROCOCCUS RAFFINOSUS CRITICAL RESULT CALLED TO, READ BACK BY AND VERIFIED WITH: PHARMD A UELAND [id number] AT 36 AM BY CM STAPHYLOCOCCUS EPIDERMIDIS THE SIGNIFICANCE OF ISOLATING THIS ORGANISM FROM A SINGLE SET OF BLOOD CULTURES WHEN MULTIPLE SETS ARE DRAWN IS UNCERTAIN. PLEASE NOTIFY THE MICROBIOLOGY DEPARTMENT WITHIN ONE WEEK IF SPECIATION AND SENSITIVITIES ARE REQUIRED. Performed at [facility], [place] [address] [place], [place] [address]    Report Status 10/25/2021 FINAL  Final   Organism ID, Bacteria ENTEROCOCCUS RAFFINOSUS  Final      Susceptibility   Enterococcus raffinosus - MIC*    AMPICILLIN 8 SENSITIVE Sensitive      VANCOMYCIN 1 SENSITIVE Sensitive     GENTAMICIN SYNERGY SENSITIVE Sensitive     * ENTEROCOCCUS RAFFINOSUS  Blood  Culture ID Panel (Reflexed)     Status: Abnormal   Collection Time: 10/22/21  3:00 AM  Result Value Ref Range Status   Enterococcus faecalis NOT DETECTED NOT DETECTED Final   Enterococcus Faecium NOT DETECTED NOT DETECTED Final   Listeria monocytogenes NOT DETECTED NOT DETECTED Final   Staphylococcus species DETECTED (A) NOT DETECTED Final    Comment: CRITICAL RESULT CALLED TO, READ BACK BY AND VERIFIED WITH: B MANCHERIL,PHARMD@0625  10/23/21 [place]    Staphylococcus aureus (BCID) NOT DETECTED NOT DETECTED Final   Staphylococcus epidermidis DETECTED (A) NOT DETECTED Final    Comment: Methicillin (oxacillin) resistant coagulase negative staphylococcus. Possible blood culture contaminant (unless isolated from more than one blood culture draw or clinical case suggests pathogenicity). No antibiotic treatment is indicated for blood  culture contaminants. CRITICAL RESULT CALLED TO, READ BACK BY AND VERIFIED WITH: B MANCHERIL,PHARMD@0625  10/23/21 [place]    Staphylococcus lugdunensis NOT DETECTED NOT DETECTED Final   Streptococcus species NOT DETECTED NOT DETECTED Final   Streptococcus agalactiae NOT DETECTED NOT DETECTED Final   Streptococcus pneumoniae NOT DETECTED NOT DETECTED Final   Streptococcus pyogenes NOT DETECTED NOT DETECTED Final   A.calcoaceticus-baumannii NOT DETECTED NOT DETECTED Final   Bacteroides fragilis NOT DETECTED NOT DETECTED Final   Enterobacterales NOT DETECTED NOT DETECTED Final   Enterobacter cloacae complex NOT DETECTED NOT DETECTED Final   Escherichia coli NOT DETECTED NOT DETECTED Final   Klebsiella aerogenes NOT DETECTED NOT DETECTED Final   Klebsiella oxytoca NOT DETECTED NOT DETECTED Final   Klebsiella pneumoniae NOT DETECTED NOT DETECTED Final   Proteus species NOT DETECTED NOT DETECTED Final   Salmonella species NOT DETECTED NOT DETECTED Final    Serratia marcescens NOT DETECTED NOT DETECTED Final   Haemophilus influenzae NOT DETECTED NOT DETECTED Final   Neisseria meningitidis NOT DETECTED NOT DETECTED Final   Pseudomonas aeruginosa NOT DETECTED NOT DETECTED Final   Stenotrophomonas maltophilia NOT DETECTED NOT DETECTED Final   Candida albicans NOT DETECTED NOT DETECTED Final   Candida auris NOT DETECTED NOT DETECTED Final   Candida glabrata NOT DETECTED NOT DETECTED Final   Candida krusei NOT DETECTED NOT DETECTED Final   Candida parapsilosis NOT DETECTED NOT DETECTED Final   Candida tropicalis NOT DETECTED NOT DETECTED Final   Cryptococcus neoformans/gattii NOT DETECTED NOT DETECTED Final   Methicillin resistance mecA/C DETECTED (A) NOT DETECTED Final    Comment: CRITICAL RESULT CALLED TO, READ BACK BY AND VERIFIED WITH: B MANCHERIL,PHARMD@0625  10/23/21 [place] Performed at [facility], [address], [place], [place] [address]   Culture, blood (routine x 2)     Status: None   Collection Time: 10/25/21  9:08 AM   Specimen: BLOOD LEFT HAND  Result Value Ref Range Status   Specimen Description BLOOD LEFT HAND  Final   Special Requests   Final    BOTTLES DRAWN AEROBIC ONLY Blood Culture results may not be optimal due to an inadequate volume of blood received in culture bottles   Culture   Final    NO GROWTH 5 DAYS Performed at [facility], [place] [address], [place], [place] [address]    Report Status 10/30/2021 FINAL  Final  Culture, blood (routine x 2)     Status: None   Collection Time: 10/25/21  9:09 AM   Specimen: BLOOD RIGHT HAND  Result Value Ref Range Status   Specimen Description BLOOD RIGHT HAND  Final   Special Requests   Final    BOTTLES DRAWN AEROBIC ONLY Blood  Culture results may not be optimal due to an inadequate volume of blood received in culture bottles   Culture   Final    NO GROWTH 5 DAYS Performed at [facility], [place] [address] [place], [place] [address]    Report Status 10/30/2021  FINAL  Final        Radiology Studies: No results found.      Scheduled Meds:  Chlorhexidine Gluconate Cloth  6 each Topical Daily   enoxaparin (LOVENOX) injection  40 mg Subcutaneous Q24H   feeding supplement  237 mL Oral BID BM   folic acid  1 mg Oral Daily   Gerhardt's butt cream   Topical TID   lactulose  10 g Oral Daily   magnesium oxide  400 mg Oral BID   multivitamin with minerals  1 tablet Oral Daily   nicotine  21 mg Transdermal Daily   nutrition supplement (JUVEN)  1 packet Oral BID BM   nystatin cream   Topical BID   Continuous Infusions:  lactated ringers 100 mL/hr at 10/31/21 1100      LOS: 9 days    Time spent: 35 minutes    Lurlene Ronda A Kolyn Rozario, MD [facility]   If 7PM-7AM, please contact night-coverage [email or web address]  10/31/2021, 2:13 PM

## 2021-10-31 NOTE — Plan of Care (Signed)

## 2021-10-31 NOTE — Plan of Care (Signed)
°  Problem: Clinical Measurements: Goal: Ability to maintain clinical measurements within normal limits will improve Outcome: Progressing Goal: Will remain free from infection Outcome: Progressing Goal: Respiratory complications will improve Outcome: Progressing Goal: Cardiovascular complication will be avoided Outcome: Progressing   Problem: Nutrition: Goal: Adequate nutrition will be maintained Outcome: Progressing   Problem: Coping: Goal: Level of anxiety will decrease Outcome: Progressing   Problem: Elimination: Goal: Will not experience complications related to bowel motility Outcome: Progressing Goal: Will not experience complications related to urinary retention Outcome: Progressing   Problem: Pain Managment: Goal: General experience of comfort will improve Outcome: Progressing   Problem: Education: Goal: Knowledge of General Education information will improve Description: Including pain rating scale, medication(s)/side effects and non-pharmacologic comfort measures Outcome: Not Progressing   Problem: Health Behavior/Discharge Planning: Goal: Ability to manage health-related needs will improve Outcome: Not Progressing   Problem: Clinical Measurements: Goal: Diagnostic test results will improve Outcome: Not Progressing   Problem: Activity: Goal: Risk for activity intolerance will decrease Outcome: Not Progressing   Problem: Safety: Goal: Ability to remain free from injury will improve Outcome: Not Progressing   Problem: Skin Integrity: Goal: Risk for impaired skin integrity will decrease Outcome: Not Progressing

## 2021-10-31 NOTE — Progress Notes (Signed)
Initial Nutrition Assessment  DOCUMENTATION CODES:   Severe malnutrition in context of social or environmental circumstances  INTERVENTION:   Continue Multivitamin w/ minerals daily Continue Ensure Enlive po BID, each supplement provides 350 kcal and 20 grams of protein Continue Juven BID  NUTRITION DIAGNOSIS:   Severe Malnutrition related to social / environmental circumstances (EtOH abuse) as evidenced by severe muscle depletion, severe fat depletion. - Ongoing   GOAL:   Patient will meet greater than or equal to 90% of their needs - Ongoing  MONITOR:   PO intake, Supplement acceptance, Skin, Weight trends  REASON FOR ASSESSMENT:   Consult Assessment of nutrition requirement/status  ASSESSMENT:   56 y.o. male presented to the ED after being found down for 4 days. PMH includes EtOH abuse w/ cirrhosis, and malnutrition. Pt admitted with cellulitis.   Pt sleeping at time of visit. RN reports that pt appetite is really good and has been eating well. RN reports that pt drinks 1-2 Ensures per day.   Per EMR, pt intake includes: 1/25: Lunch 100% 1/26: Breakfast 100%, Lunch 95% 1/27: Breakfast 100%, Lunch 100% 1/28: Breakfast 100%, Lunch 100%, Dinner 100%  Per Meds History, pt is taking all ONS that are offered.   TOC is currently working on SNF placement for pt.   Medications reviewed and include: Folic Acid, Lactulose, Magnesium Oxide, MVI Labs reviewed.  Diet Order:   Diet Order             Diet regular Room service appropriate? No; Fluid consistency: Thin  Diet effective now                   EDUCATION NEEDS:   Not appropriate for education at this time  Skin:  Skin Assessment: Skin Integrity Issues: Skin Integrity Issues:: Unstageable, Stage II Stage II: Penis Unstageable: R Heel, L & R Toes  Last BM:  1/29  Height:   Ht Readings from Last 1 Encounters:  09/20/21 6' (1.829 m)    Weight:   Wt Readings from Last 1 Encounters:  10/24/21  79.4 kg    Ideal Body Weight:  80.9 kg  BMI:  Body mass index is 23.73 kg/m.  Estimated Nutritional Needs:   Kcal:  2100-2300  Protein:  105-120 grams  Fluid:  >/= 2.1 L    Neema Barreira Louie Casa, RD, LDN Clinical Dietitian See [facility] for contact information.

## 2021-11-01 DIAGNOSIS — L03119 Cellulitis of unspecified part of limb: Secondary | ICD-10-CM | POA: Diagnosis not present

## 2021-11-01 LAB — BASIC METABOLIC PANEL
Anion gap: 12 (ref 5–15)
BUN: 8 mg/dL (ref 6–20)
CO2: 19 mmol/L — ABNORMAL LOW (ref 22–32)
Calcium: 8.9 mg/dL (ref 8.9–10.3)
Chloride: 98 mmol/L (ref 98–111)
Creatinine, Ser: 0.63 mg/dL (ref 0.61–1.24)
GFR, Estimated: >60 mL/min (ref >60)
Glucose, Bld: 73 mg/dL (ref 70–99)
Potassium: 4.7 mmol/L (ref 3.5–5.1)
Sodium: 129 mmol/L — ABNORMAL LOW (ref 135–145)

## 2021-11-01 MED ORDER — SODIUM CHLORIDE 0.9 % IV SOLN: INTRAVENOUS | Status: DC

## 2021-11-01 NOTE — Progress Notes (Signed)
Physical Therapy Treatment Patient Details Name: Dylan Weaver MRN: [id number] DOB: 1966/01/03 Today's Date: 11/01/2021   History of Present Illness 56 year old male presented to the ER 10/22/21 after being found down on the floor. Had been unable to get off the floor x 4 days when housekeeper found him. +incontinence of urine and feces with irritant contact dermatitis over bil anterior, medial thighs and perineum; sepsis; +CIWA  PMH chronic alcohol abuse, alcoholic cirrhosis, malnutrition,    PT Comments    Patient remains confused with decr safety awareness during mobility. Endurance improved with ability to walk a longer distance, however continues to need min assist for balance (especially when running into objects in hallway with RW). Patient moves impulsively and too quick for his level of balance and his limited introduction to using RW. Will incr freq to assist with potential discharge home vs long-term care, however unless cognition improves, he may still be unsafe to be alone.     Recommendations for follow up therapy are one component of a multi-disciplinary discharge planning process, led by the attending physician.  Recommendations may be updated based on patient status, additional functional criteria and insurance authorization.  Follow Up Recommendations  Skilled nursing-short term rehab (<3 hours/day)     Assistance Recommended at Discharge Frequent or constant Supervision/Assistance  Patient can return home with the following A little help with walking and/or transfers;Help with stairs or ramp for entrance;Assist for transportation;Direct supervision/assist for medications management;Assistance with cooking/housework;Direct supervision/assist for financial management;A little help with bathing/dressing/bathroom   Equipment Recommendations  Rolling walker (2 wheels)    Recommendations for Other Services OT consult     Precautions / Restrictions  Precautions Precautions: Fall;Other (comment) Precaution Comments: poor safety awareness Restrictions Weight Bearing Restrictions: No     Mobility  Bed Mobility Overal bed mobility: Needs Assistance Bed Mobility: Supine to Sit, Sit to Supine     Supine to sit: Supervision Sit to supine: Supervision   General bed mobility comments: Assist for safety; impulsivity    Transfers Overall transfer level: Needs assistance Equipment used: Rolling walker (2 wheels) Transfers: Sit to/from Stand Sit to Stand: Min guard           General transfer comment: vc for correct hand placemnent wiht RW; slightly unsteady as rising to stand    Ambulation/Gait Ambulation/Gait assistance: Min assist Gait Distance (Feet): 200 Feet Assistive device: Rolling walker (2 wheels) Gait Pattern/deviations: Step-through pattern, Narrow base of support Gait velocity: decr     General Gait Details: Assist for balance due to instability, narrow base of support; ran into objects with wheels of RW x 2 with imbalance and min assist to recover   Stairs             Wheelchair Mobility    Modified Rankin (Stroke Patients Only)       Balance Overall balance assessment: Needs assistance Sitting-balance support: No upper extremity supported, Feet supported Sitting balance-Leahy Scale: Fair Sitting balance - Comments: leaning to his right   Standing balance support: Bilateral upper extremity supported, During functional activity Standing balance-Leahy Scale: Poor Standing balance comment: walker and min assist for static standing                            Cognition Arousal/Alertness: Awake/alert Behavior During Therapy: Impulsive Overall Cognitive Status: No family/caregiver present to determine baseline cognitive functioning Area of Impairment: Orientation, Memory, Safety/judgement, Awareness, Problem solving, Attention, Following commands  Orientation  Level: Disoriented to, Time ("2024") Current Attention Level: Sustained Memory: Decreased short-term memory Following Commands: Follows multi-step commands inconsistently Safety/Judgement: Decreased awareness of safety, Decreased awareness of deficits Awareness: Intellectual Problem Solving: Requires verbal cues General Comments: moves more quickly than is safe for situation (i.e. unsteady and IV in RUE); multiple repeat cues given for safe use of RW        Exercises      General Comments        Pertinent Vitals/Pain Pain Assessment Pain Assessment: No/denies pain Faces Pain Scale: Hurts a little bit Pain Location: legs and buttocks Pain Descriptors / Indicators: Sore    Home Living                          Prior Function            PT Goals (current goals can now be found in the care plan section) Acute Rehab PT Goals Patient Stated Goal: go home PT Goal Formulation: With patient Time For Goal Achievement: 11/08/21 Potential to Achieve Goals: Fair Progress towards PT goals: Progressing toward goals    Frequency    Min 2X/week      PT Plan Current plan remains appropriate    Co-evaluation              AM-PAC PT "6 Clicks" Mobility   Outcome Measure  Help needed turning from your back to your side while in a flat bed without using bedrails?: A Little Help needed moving from lying on your back to sitting on the side of a flat bed without using bedrails?: A Little Help needed moving to and from a bed to a chair (including a wheelchair)?: A Little Help needed standing up from a chair using your arms (e.g., wheelchair or bedside chair)?: A Little Help needed to walk in hospital room?: A Little Help needed climbing 3-5 steps with a railing? : Total 6 Click Score: 16    End of Session Equipment Utilized During Treatment: Gait belt Activity Tolerance: Patient tolerated treatment well Patient left: with call bell/phone within reach;in  chair;with chair alarm set Nurse Communication: Mobility status PT Visit Diagnosis: Unsteadiness on feet (R26.81);Difficulty in walking, not elsewhere classified (R26.2)     Time: 0347-4259 PT Time Calculation (min) (ACUTE ONLY): 22 min  Charges:  $Gait Training: 8-22 mins                      Jerolyn Center, PT Acute Rehabilitation Services  Pager [phone] Office [phone]    Zena Amos 11/01/2021, 1:27 PM

## 2021-11-01 NOTE — Progress Notes (Addendum)
PROGRESS NOTE    Dylan Weaver  QQI:[id number] DOB: 1965-10-24 DOA: 10/22/2021 PCP: [facility]   Brief Narrative: 56 year old past medical history significant for chronic alcohol abuse, alcoholic cirrhosis, malnutrition presented to ER after being found down on the floor.  Patient stated that he has been sleeping on the floor for the last 4 days on his back.  He has been unable to get off of the floor.  He has been urinating and defecating on himself.  His housekeeper came over  and called EMS.  Patient presented with lactic acid of 4.4, COVID-positive, total CK1490, white blood cell 13.  IV fluids lactate decreased to 2.3.  Patient was admitted with cellulitis, Rhabdomyolysis and sepsis with acute organ dysfunction.  CT abdomen and pelvis was negative for perineal abscess.  He is stable, awaiting SNF>    Assessment & Plan:   Principal Problem:   Cellulitis Active Problems:   Alcohol use disorder, severe, dependence (HCC)   Alcohol withdrawal (HCC)   Elevated LFTs   Tobacco abuse   Irritant contact dermatitis due to incontinence of both feces and urine   Rhabdomyolysis   Sepsis with acute organ dysfunction without septic shock (HCC)   Pressure injury of skin   Enterococcal bacteremia   1-Cellulitis of Bilateral thigh and Pubic Area;  -2/4 Blood culture positive for Stap epidermidis, and Enterococcus Raffinosus.  -Treated initially  with vancomycin and ceftriaxone. Completed 8 days  treatment of Amoxicillin.  -Repeated Blood Cultures 1/24: No growth to date.  -ID consulted. Recommend complete 7 days for enterococcus Bacteremia.  -Cellulitis resolved.   2-Alcohol use disorder, alcohol withdrawal: Completed  CIWA protocol. Continue with thiamine and folic acid. Stable.   Transaminases: Secondary to alcohol abuse and history of cirrhosis -Follow trend/.  -LFT trending down.   Tobacco abuse: Counseling provided.   Sepsis with acute organ dysfunction  without septic shock: secondary to cellulitis.  Presented with hypothermia, Tachypnea RR 26, Tachycardia , leukocytosis.  Sepsis POA> resolved/  Treated with fluids, and antibiotics.   Acute metabolic encephalopathy: Secondary to alcohol withdrawal and infectious process Ammonia 42, started lactulose daily.  Mental status Improved.   Irritant contact dermatitis due to incontinence of both feces and urine: Continue with nystatin cream Improving.   Hypotension;  Improved with IV fluids.  Cortisol low at 5.  Cosyntropin test negative for  adrenal insufficiency.   Rhabdomyolysis: CK 1490--- decreased to 147 after hydration Treated with IV fluids  Hyponatremia: Treated with IV fluids , repeat B-met Addendum;  -sodium down to 129, change LR to NS IV fluids. Repeat labs in am.   Hypokalemia: Replaced.   Hypomagnesemia: Mg 1.8. replete Magnesium/ orally.   Thrombocytopenia: Secondary to cirrhosis/ alcohol. Resolved.   COVID-positive: Chest x-ray negative, no hypoxic. Unable to provide significant history. Monotor  COVID-19 Labs Needs 10 days Quarantine. Completed  Quarantine: 1/30.   No results for input(s): DDIMER, FERRITIN, LDH, CRP in the last 72 hours.  Lab Results  Component Value Date   [place] (A) 10/22/2021   [place] NEGATIVE 09/20/2021   [place] NEGATIVE 08/25/2021   [facility] NEGATIVE 06/04/2021                                  See Wound documentation Below.      Pressure Injury 10/22/21 Toe (Comment  which one) Anterior;Left Unstageable - Full thickness tissue loss in which the base of the injury is  covered by slough (yellow, tan, gray, green or brown) and/or eschar (tan, brown or black) in the wound bed. Right  (Active)  10/22/21 1640  Location: Toe (Comment  which one)  Location Orientation: Anterior;Left  Staging: Unstageable - Full thickness tissue loss in which the base of the injury is covered by slough (yellow, tan,  gray, green or brown) and/or eschar (tan, brown or black) in the wound bed.  Wound Description (Comments): Right great toe Black wound bed  Present on Admission: Yes     Pressure Injury 10/22/21 Toe (Comment  which one) Anterior;Left Unstageable - Full thickness tissue loss in which the base of the injury is covered by slough (yellow, tan, gray, green or brown) and/or eschar (tan, brown or black) in the wound bed. left s (Active)  10/22/21 1640  Location: Toe (Comment  which one)  Location Orientation: Anterior;Left  Staging: Unstageable - Full thickness tissue loss in which the base of the injury is covered by slough (yellow, tan, gray, green or brown) and/or eschar (tan, brown or black) in the wound bed.  Wound Description (Comments): left second toe, black  Present on Admission: Yes     Pressure Injury 10/22/21 Toe (Comment  which one) Anterior;Right Unstageable - Full thickness tissue loss in which the base of the injury is covered by slough (yellow, tan, gray, green or brown) and/or eschar (tan, brown or black) in the wound bed. right (Active)  10/22/21 1600  Location: Toe (Comment  which one)  Location Orientation: Anterior;Right  Staging: Unstageable - Full thickness tissue loss in which the base of the injury is covered by slough (yellow, tan, gray, green or brown) and/or eschar (tan, brown or black) in the wound bed.  Wound Description (Comments): right second toe, black  Present on Admission: Yes     Pressure Injury 10/22/21 Penis Distal Stage 2 -  Partial thickness loss of dermis presenting as a shallow open injury with a red, pink wound bed without slough. (Active)  10/22/21 1640  Location: Penis  Location Orientation: Distal  Staging: Stage 2 -  Partial thickness loss of dermis presenting as a shallow open injury with a red, pink wound bed without slough.  Wound Description (Comments):   Present on Admission:      Nutrition Problem: Severe Malnutrition Etiology: social /  environmental circumstances (EtOH abuse)    Signs/Symptoms: severe muscle depletion, severe fat depletion    Interventions: Ensure Enlive (each supplement provides 350kcal and 20 grams of protein), MVI, Juven  Estimated body mass index is 23.73 kg/m as calculated from the following:   Height as of 09/20/21: 6' (1.829 m).   Weight as of this encounter: 79.4 kg.   DVT prophylaxis: Heparin  Code Status: Full code Family Communication: Aunt over phone updated 1/24  Disposition Plan:  Status is: Inpatient  Remains inpatient appropriate because: encephalopathy, alcohol withdrawal. Awaiting SNF. Off Quarantine 1/31  Consultants:  None  Procedures:  None  Antimicrobials:    Subjective: He is feeling well, no new complaints.   Objective: Vitals:   10/31/21 2310 11/01/21 0308 11/01/21 0808 11/01/21 1208  BP: (!) _0 96/74  Pulse: 74 68 70 74  Resp: _1 Temp: 97.8 F (36.6 C) 97.7 F (36.5 C) 98.2 F (36.8 C) 98 F (36.7 C)  TempSrc: Oral Oral Oral Oral  SpO2: 95% 97% 98% 99%  Weight:        Intake/Output Summary (Last 24 hours) at 11/01/2021 1353 Last data  filed at 11/01/2021 1100 Gross per 24 hour  Intake 1398.82 ml  Output 1775 ml  Net -376.18 ml    Filed Weights   10/24/21 1100  Weight: 79.4 kg    Examination:  General exam: Thing appearing Respiratory system: CTA Cardiovascular system: S 1, S 2 RRR Gastrointestinal system: BS present, soft, nt Central nervous system: Alert, follows command Extremities: No edema Skin: rash LE improved  Data Reviewed: I have personally reviewed following labs and imaging studies  CBC: Recent Labs  Lab 10/26/21 0424  WBC 4.8  HGB 11.4*  HCT 35.1*  MCV 95.9  PLT 834    Basic Metabolic Panel: Recent Labs  Lab 10/26/21 0424 10/27/21 0844 10/28/21 0107 10/30/21 1012  NA 132* 135 133* 130*  K 3.2* 3.7 3.6 4.2  CL 102 104 102 99  CO2 _0 21*  GLUCOSE 97 85 113* 121*  BUN 7  <5* <5* 7  CREATININE 0.59* 0.47* 0.63 0.65  CALCIUM 8.0* 8.3* 8.1* 8.7*  MG 1.4* 1.7 1.8  --     GFR: Estimated Creatinine Clearance: 114.5 mL/min (by C-G formula based on SCr of 0.65 mg/dL). Liver Function Tests: Recent Labs  Lab 10/26/21 0424  AST 65*  ALT 46*  ALKPHOS 142*  BILITOT 0.6  PROT 5.9*  ALBUMIN 1.7*    No results for input(s): LIPASE, AMYLASE in the last 168 hours. No results for input(s): AMMONIA in the last 168 hours.  Coagulation Profile: No results for input(s): INR, PROTIME in the last 168 hours.  Cardiac Enzymes: No results for input(s): CKTOTAL, CKMB, CKMBINDEX, TROPONINI in the last 168 hours.  BNP (last 3 results) No results for input(s): PROBNP in the last 8760 hours. HbA1C: No results for input(s): HGBA1C in the last 72 hours. CBG: No results for input(s): GLUCAP in the last 168 hours. Lipid Profile: No results for input(s): CHOL, HDL, LDLCALC, TRIG, CHOLHDL, LDLDIRECT in the last 72 hours. Thyroid Function Tests: No results for input(s): TSH, T4TOTAL, FREET4, T3FREE, THYROIDAB in the last 72 hours. Anemia Panel: No results for input(s): VITAMINB12, FOLATE, FERRITIN, TIBC, IRON, RETICCTPCT in the last 72 hours. Sepsis Labs: No results for input(s): PROCALCITON, LATICACIDVEN in the last 168 hours.   Recent Results (from the past 240 hour(s))  Culture, blood (routine x 2)     Status: None   Collection Time: 10/25/21  9:08 AM   Specimen: BLOOD LEFT HAND  Result Value Ref Range Status   Specimen Description BLOOD LEFT HAND  Final   Special Requests   Final    BOTTLES DRAWN AEROBIC ONLY Blood Culture results may not be optimal due to an inadequate volume of blood received in culture bottles   Culture   Final    NO GROWTH 5 DAYS Performed at [facility], [place] [address] [place], [place] [address]    Report Status 10/30/2021 FINAL  Final  Culture, blood (routine x 2)     Status: None   Collection Time: 10/25/21  9:09 AM   Specimen:  BLOOD RIGHT HAND  Result Value Ref Range Status   Specimen Description BLOOD RIGHT HAND  Final   Special Requests   Final    BOTTLES DRAWN AEROBIC ONLY Blood Culture results may not be optimal due to an inadequate volume of blood received in culture bottles   Culture   Final    NO GROWTH 5 DAYS Performed at [facility], [place] [address], [place], [place] [address]  Report Status 10/30/2021 FINAL  Final        Radiology Studies: No results found.      Scheduled Meds:  Chlorhexidine Gluconate Cloth  6 each Topical Daily   enoxaparin (LOVENOX) injection  40 mg Subcutaneous Q24H   feeding supplement  237 mL Oral BID BM   folic acid  1 mg Oral Daily   Gerhardt's butt cream   Topical TID   lactulose  10 g Oral Daily   magnesium oxide  400 mg Oral BID   multivitamin with minerals  1 tablet Oral Daily   nicotine  21 mg Transdermal Daily   nutrition supplement (JUVEN)  1 packet Oral BID BM   nystatin cream   Topical BID   Continuous Infusions:  lactated ringers 100 mL/hr at 11/01/21 1100      LOS: 10 days    Time spent: 35 minutes    Shadie Sweatman A Koda Routon, MD [facility]   If 7PM-7AM, please contact night-coverage [email or web address]  11/01/2021, 1:53 PM

## 2021-11-01 NOTE — Progress Notes (Addendum)
Occupational Therapy Treatment Patient Details Name: Dylan Weaver MRN: [id number] DOB: 26-Apr-1966 Today's Date: 11/01/2021   History of present illness 56 year old male presented to the ER 10/22/21 after being found down on the floor. Had been unable to get off the floor x 4 days when housekeeper found him. +incontinence of urine and feces with irritant contact dermatitis over bil anterior, medial thighs and perineum; sepsis; +CIWA  PMH chronic alcohol abuse, alcoholic cirrhosis, malnutrition,   OT comments  Pt in bed upon arrival with covers pulled over his head. Pt required max encouragement for OOB activity. Patient remains confused with poor safety awareness during ADL and ADL mobility. Session focused on grooming, UB dressing , donning socks, using RW to walk to commode. Pt refused to sit up in recliner and adamantly insisted on going back to bed. OT will continue to follow acutely   Recommendations for follow up therapy are one component of a multi-disciplinary discharge planning process, led by the attending physician.  Recommendations may be updated based on patient status, additional functional criteria and insurance authorization.    Follow Up Recommendations  Skilled nursing-short term rehab (<3 hours/day)    Assistance Recommended at Discharge Frequent or constant Supervision/Assistance  Patient can return home with the following  A lot of help with bathing/dressing/bathroom;Direct supervision/assist for medications management;Assist for transportation;Help with stairs or ramp for entrance;Assistance with cooking/housework;A little help with walking and/or transfers   Equipment Recommendations  BSC/3in1    Recommendations for Other Services      Precautions / Restrictions Precautions Precautions: Fall;Other (comment) Precaution Comments: poor safety awareness Restrictions Weight Bearing Restrictions: No       Mobility Bed Mobility Overal bed mobility: Needs  Assistance Bed Mobility: Supine to Sit, Sit to Supine     Supine to sit: Supervision Sit to supine: Supervision   General bed mobility comments: Sup for safety    Transfers Overall transfer level: Needs assistance Equipment used: 1 person hand held assist, Rolling walker (2 wheels) Transfers: Sit to/from Stand Sit to Stand: Min assist           General transfer comment: Pt pulling up on walker despite verbal/tactile cues     Balance Overall balance assessment: Needs assistance Sitting-balance support: No upper extremity supported, Feet supported Sitting balance-Leahy Scale: Fair Sitting balance - Comments: leaning to his right   Standing balance support: Bilateral upper extremity supported, During functional activity Standing balance-Leahy Scale: Poor                             ADL either performed or assessed with clinical judgement   ADL Overall ADL's : Needs assistance/impaired     Grooming: Wash/dry hands;Wash/dry face;Minimal assistance;Standing           Upper Body Dressing : Min guard;Sitting   Lower Body Dressing: Sitting/lateral leans;Minimal assistance Lower Body Dressing Details (indicate cue type and reason): to don socks seated EOB Toilet Transfer: Minimal assistance;Stand-pivot;Cueing for safety;Cueing for sequencing;Rolling walker (2 wheels)   Toileting- Clothing Manipulation and Hygiene: Moderate assistance Toileting - Clothing Manipulation Details (indicate cue type and reason): clothing mgt     Functional mobility during ADLs: Minimal assistance;Cueing for safety;Cueing for sequencing;Rolling walker (2 wheels)      Extremity/Trunk Assessment Upper Extremity Assessment Upper Extremity Assessment: Generalized weakness   Lower Extremity Assessment Lower Extremity Assessment: Defer to PT evaluation   Cervical / Trunk Assessment Cervical / Trunk Assessment: Kyphotic    Vision  Baseline Vision/History: 1 Wears glasses Ability  to See in Adequate Light: 0 Adequate Patient Visual Report: No change from baseline     Perception     Praxis      Cognition Arousal/Alertness: Awake/alert Behavior During Therapy: Flat affect, Impulsive Overall Cognitive Status: No family/caregiver present to determine baseline cognitive functioning Area of Impairment: Orientation, Memory, Safety/judgement, Awareness, Problem solving                     Memory: Decreased short-term memory   Safety/Judgement: Decreased awareness of safety, Decreased awareness of deficits   Problem Solving: Requires verbal cues, Decreased initiation          Exercises      Shoulder Instructions       General Comments      Pertinent Vitals/ Pain       Pain Assessment Pain Assessment: Faces Faces Pain Scale: Hurts a little bit Pain Location: legs and buttocks Pain Descriptors / Indicators: Sore Pain Intervention(s): Limited activity within patient's tolerance, Monitored during session, Repositioned  Home Living                                          Prior Functioning/Environment              Frequency           Progress Toward Goals  OT Goals(current goals can now be found in the care plan section)  Progress towards OT goals: Progressing toward goals     Plan Discharge plan remains appropriate;Frequency remains appropriate    Co-evaluation                 AM-PAC OT "6 Clicks" Daily Activity     Outcome Measure   Help from another person eating meals?: None Help from another person taking care of personal grooming?: A Little Help from another person toileting, which includes using toliet, bedpan, or urinal?: A Little Help from another person bathing (including washing, rinsing, drying)?: A Lot Help from another person to put on and taking off regular upper body clothing?: A Little Help from another person to put on and taking off regular lower body clothing?: A Lot 6 Click  Score: 17    End of Session Equipment Utilized During Treatment: Gait belt;Rolling walker (2 wheels)  OT Visit Diagnosis: Unsteadiness on feet (R26.81);Other abnormalities of gait and mobility (R26.89);Muscle weakness (generalized) (M62.81);Other symptoms and signs involving cognitive function;Pain Pain - part of body: Leg (buttocks)   Activity Tolerance Patient limited by fatigue   Patient Left with call bell/phone within reach;in bed;with bed alarm set   Nurse Communication Mobility status        Time: 0946-1000 OT Time Calculation (min): 14 min  Charges: OT General Charges $OT Visit: 1 Visit OT Treatments $Self Care/Home Management : 8-22 mins    Britt Bottom 11/01/2021, 1:52 PM

## 2021-11-01 NOTE — Plan of Care (Signed)

## 2021-11-02 DIAGNOSIS — G9341 Metabolic encephalopathy: Secondary | ICD-10-CM

## 2021-11-02 DIAGNOSIS — F10939 Alcohol use, unspecified with withdrawal, unspecified: Secondary | ICD-10-CM

## 2021-11-02 DIAGNOSIS — E876 Hypokalemia: Secondary | ICD-10-CM

## 2021-11-02 DIAGNOSIS — U071 COVID-19: Secondary | ICD-10-CM | POA: Diagnosis present

## 2021-11-02 LAB — BASIC METABOLIC PANEL
Anion gap: 9 (ref 5–15)
BUN: 8 mg/dL (ref 6–20)
CO2: 20 mmol/L — ABNORMAL LOW (ref 22–32)
Calcium: 8.5 mg/dL — ABNORMAL LOW (ref 8.9–10.3)
Chloride: 106 mmol/L (ref 98–111)
Creatinine, Ser: 0.95 mg/dL (ref 0.61–1.24)
GFR, Estimated: >60 mL/min (ref >60)
Glucose, Bld: 116 mg/dL — ABNORMAL HIGH (ref 70–99)
Potassium: 3.6 mmol/L (ref 3.5–5.1)
Sodium: 135 mmol/L (ref 135–145)

## 2021-11-02 NOTE — Assessment & Plan Note (Signed)
Due to dehydration-resolved-recheck electrolytes periodically.

## 2021-11-02 NOTE — Hospital Course (Addendum)
56 year old with history of EtOH use, cirrhosis-was found down on the floor of his house-covered with feces/urine-he was found to have COVID-19 infection, rhabdomyolysis, cellulitis of his bilateral thighs and staff epidermidis/Enterococcus bacteremia.  He was managed with supportive care-he has stabilized and is significantly better.  See below for further details.  Significant imaging studies: 1/21>> CT head: No acute abnormalities 1/21>> CT C-spine: No fracture/dislocation 1/21>> CT chest/abdomen/pelvis: No acute traumatic injury to chest/abdomen/pelvis.  Multiple old left rib fractures.  Significant microbiology data: 1/21>> COVID/flu PCR: Negative 1/21>> urine culture: Negative 1/21>> 1/2 blood culture: Positive for staph hominis, Enterococcus raffinosus 1/24>> blood culture: Negative

## 2021-11-02 NOTE — Assessment & Plan Note (Signed)
Repleted-recheck periodically. °

## 2021-11-02 NOTE — TOC Progression Note (Signed)
Transition of Care [facility]) - Progression Note    Patient Details  Name: Dylan Weaver MRN: [id number] Date of Birth: 14-Aug-1966  Transition of Care [facility]) CM/SW [place], RN Phone Number: 11/02/2021, 3:57 PM  Clinical Narrative:     Spoke to [place], [place] at [place] [phone] regarding patient's transition needs. This RN-CM emailed [place], [place] the DME form request for Rolling walker. TOC will continue to follow    Expected Discharge Plan: [place] Barriers to Discharge: SNF Covid, SNF Pending bed offer  Expected Discharge Plan and Services Expected Discharge Plan: [place] In-house Referral: Clinical Social Work   Post Acute Care Choice: [place] Living arrangements for the past 2 months: Single Family Home                                       Social Determinants of Health (SDOH) Interventions    Readmission Risk Interventions No flowsheet data found.

## 2021-11-02 NOTE — TOC Progression Note (Signed)
Transition of Care [facility]) - Progression Note    Patient Details  Name: Suleman Wools MRN: [id number] Date of Birth: 1966-06-20  Transition of Care [facility]) CM/SW [place], LCSW Phone Number: 11/02/2021, 10:12 AM  Clinical Narrative:    CSW sent message to [facility] to inquire if they are contracted with the [place] as instructed by [facility] leadership.    Expected Discharge Plan: Skilled Nursing Facility Barriers to Discharge: SNF Covid, SNF Pending bed offer  Expected Discharge Plan and Services Expected Discharge Plan: [place] In-house Referral: Clinical Social Work   Post Acute Care Choice: [place] Living arrangements for the past 2 months: Single Family Home                                       Social Determinants of Health (SDOH) Interventions    Readmission Risk Interventions No flowsheet data found.

## 2021-11-02 NOTE — Progress Notes (Signed)
Physical Therapy Treatment Patient Details Name: Dylan Weaver MRN: [id number] DOB: [birth year] Today's Date: 11/02/2021   History of Present Illness 56 year old male presented to the ER 10/22/21 after being found down on the floor. Had been unable to get off the floor x 4 days when housekeeper found him. +incontinence of urine and feces with irritant contact dermatitis over bil anterior, medial thighs and perineum; sepsis; +CIWA  PMH chronic alcohol abuse, alcoholic cirrhosis, malnutrition,    PT Comments    Patient able to better follow safety instructions with regards to proper use of RW and slower velocity during gait (more appropriate for his balance deficits and unfamiliarity with use of RW). Continues to require at least light bil UE support for dynamic balance activities.     Recommendations for follow up therapy are one component of a multi-disciplinary discharge planning process, led by the attending physician.  Recommendations may be updated based on patient status, additional functional criteria and insurance authorization.  Follow Up Recommendations  Skilled nursing-short term rehab (<3 hours/day)     Assistance Recommended at Discharge Frequent or constant Supervision/Assistance  Patient can return home with the following A little help with walking and/or transfers;Help with stairs or ramp for entrance;Assist for transportation;Direct supervision/assist for medications management;Assistance with cooking/housework;Direct supervision/assist for financial management;A little help with bathing/dressing/bathroom   Equipment Recommendations  Rolling walker (2 wheels)    Recommendations for Other Services       Precautions / Restrictions Precautions Precautions: Fall;Other (comment) Precaution Comments: poor safety awareness     Mobility  Bed Mobility Overal bed mobility: Needs Assistance Bed Mobility: Supine to Sit, Sit to Supine     Supine to sit:  Supervision Sit to supine: Supervision   General bed mobility comments: safety with lines as pt does not attend to these    Transfers Overall transfer level: Needs assistance Equipment used: Rolling walker (2 wheels) Transfers: Sit to/from Stand Sit to Stand: Min assist           General transfer comment: Pt pulling up on walker but did follow instructions to move hands to bed to push up; on repetition required same cues repeated    Ambulation/Gait Ambulation/Gait assistance: Min assist Gait Distance (Feet): 250 Feet Assistive device: Rolling walker (2 wheels) Gait Pattern/deviations: Step-through pattern, Narrow base of support Gait velocity: more appropriate for his level of ability     General Gait Details: Assist for balance due to instability, narrow base of support;   Stairs             Wheelchair Mobility    Modified Rankin (Stroke Patients Only)       Balance Overall balance assessment: Needs assistance Sitting-balance support: No upper extremity supported, Feet supported Sitting balance-Leahy Scale: Fair Sitting balance - Comments: leaning to his right   Standing balance support: Bilateral upper extremity supported, During functional activity Standing balance-Leahy Scale: Poor Standing balance comment: walker and min assist for static standing         [place]berg - Eyes Opened: 15 [place] - Eyes Closed: 10   High Level Balance Comments: heel raises x 10 reps with light UE support via RW for balance challenge; same light UE support for squats            Cognition Arousal/Alertness: Awake/alert Behavior During Therapy: Flat affect, Impulsive Overall Cognitive Status: No family/caregiver present to determine baseline cognitive functioning Area of Impairment: Memory, Safety/judgement, Awareness, Problem solving  Orientation Level:  (NT) Current Attention Level: Selective Memory: Decreased short-term memory Following  Commands: Follows multi-step commands inconsistently Safety/Judgement: Decreased awareness of safety, Decreased awareness of deficits Awareness: Intellectual Problem Solving: Requires verbal cues, Decreased initiation General Comments: better job following instructions to move more slowly and in control        Exercises      General Comments        Pertinent Vitals/Pain Pain Assessment Pain Assessment: No/denies pain    Home Living                          Prior Function            PT Goals (current goals can now be found in the care plan section) Acute Rehab PT Goals Patient Stated Goal: go home Time For Goal Achievement: 11/08/21 Potential to Achieve Goals: Fair Progress towards PT goals: Progressing toward goals    Frequency    Min 2X/week      PT Plan Current plan remains appropriate    Co-evaluation              AM-PAC PT "6 Clicks" Mobility   Outcome Measure  Help needed turning from your back to your side while in a flat bed without using bedrails?: A Little Help needed moving from lying on your back to sitting on the side of a flat bed without using bedrails?: A Little Help needed moving to and from a bed to a chair (including a wheelchair)?: A Little Help needed standing up from a chair using your arms (e.g., wheelchair or bedside chair)?: A Little Help needed to walk in hospital room?: A Little Help needed climbing 3-5 steps with a railing? : Total 6 Click Score: 16    End of Session Equipment Utilized During Treatment: Gait belt Activity Tolerance: Patient tolerated treatment well Patient left: in bed;with bed alarm set;with call bell/phone within reach Nurse Communication: Mobility status PT Visit Diagnosis: Unsteadiness on feet (R26.81);Difficulty in walking, not elsewhere classified (R26.2)     Time: 9373-4287 PT Time Calculation (min) (ACUTE ONLY): 19 min  Charges:  $Gait Training: 8-22 mins                       Jerolyn Center, PT Acute Rehabilitation Services  Pager [phone] Office [phone]    Zena Amos 11/02/2021, 12:58 PM

## 2021-11-02 NOTE — Progress Notes (Addendum)
PROGRESS NOTE        PATIENT DETAILS Name: Dylan Weaver Age: 56 y.o. Sex: male Date of Birth: 1965/12/05 Admit Date: 10/22/2021 Admitting Physician Kristopher Oppenheim, DO [id number], [place]  Brief Summary: 56 year old with history of EtOH use, cirrhosis-was found down on the floor of his house-covered with feces/urine-he was found to have COVID-19 infection, rhabdomyolysis, cellulitis of his bilateral thighs and staff epidermidis/Enterococcus bacteremia.  He was managed with supportive care-he has stabilized and is now awaiting SNF placement.  See below for further details.  Subjective: Lying comfortably in bed-denies any chest pain or shortness of breath.  Objective: Vitals: Blood pressure 109/70, pulse 76, temperature 97.9 F (36.6 C), temperature source Oral, resp. rate 17, weight 79.4 kg, SpO2 99 %.   Exam: Gen Exam:Alert awake-not in any distress HEENT:atraumatic, normocephalic Chest: B/L clear to auscultation anteriorly CVS:S1S2 regular Abdomen:soft non tender, non distended Extremities:no edema Neurology: Non focal Skin: no rash  Pertinent Labs/Radiology: CBC Latest Ref Rng & Units 10/26/2021 10/25/2021 10/24/2021  WBC 4.0 - 10.5 K/uL 4.8 4.5 3.8(L)  Hemoglobin 13.0 - 17.0 g/dL 11.4(L) 12.2(L) 13.0  Hematocrit 39.0 - 52.0 % 35.1(L) 36.4(L) 40.3  Platelets 150 - 400 K/uL 152 104(L) 106(L)    Lab Results  Component Value Date   NA 135 11/02/2021   K 3.6 11/02/2021   CL 106 11/02/2021   CO2 20 (L) 11/02/2021      Assessment/Plan: * Cellulitis- (present on admission) Admit to observation telemetry bed. IV rocephin, IV Vancomycin.  Sepsis with acute organ dysfunction without septic shock due to bilateral thigh/pubic area cellulitis and enterococcal bacteremia Sepsis physiology resolved resolved-repeat blood cultures negative-completed a course of antibiotics.  ID consulted during this hospital stay.  Acute metabolic  encephalopathy Due to sepsis/bacteremia-neuro imaging negative-encephalopathy has resolved-he is back to baseline.  Rhabdomyolysis Improved with IVF.  Elevated LFTs- (present on admission) Multifactorial etiology-from rhabdomyolysis/alcoholic hepatitis and underlying cirrhosis.  LFTs have stabilized-repeat electrolytes periodically  Alcohol use disorder with alcohol withdrawal - (present on admission) No signs of withdrawal-out of window for any withdrawal symptoms.  Managed with CIWA protocol  Hyponatremia Due to dehydration-resolved-recheck electrolytes periodically.  Hypokalemia/hypomagnesemia Repleted-recheck periodically.  Irritant contact dermatitis due to incontinence of both feces and urine- (present on admission) Continue wound care-cellulitic area has significantly improved.  COVID-19 virus infection- (present on admission) Now asymptomatic-has completed 10 days of isolation.  Tobacco abuse- (present on admission) Chronic.   Nutrition Status: Nutrition Problem: Severe Malnutrition Etiology: social / environmental circumstances (EtOH abuse) Signs/Symptoms: severe muscle depletion, severe fat depletion Interventions: Ensure Enlive (each supplement provides 350kcal and 20 grams of protein), MVI, Juven  Pressure Injury 08/26/21 Buttocks Right Unstageable - Full thickness tissue loss in which the base of the injury is covered by slough (yellow, tan, gray, green or brown) and/or eschar (tan, brown or black) in the wound bed. (Active)  08/26/21 0231  Location: Buttocks  Location Orientation: Right  Staging: Unstageable - Full thickness tissue loss in which the base of the injury is covered by slough (yellow, tan, gray, green or brown) and/or eschar (tan, brown or black) in the wound bed.  Wound Description (Comments):   Present on Admission: Yes     Pressure Injury 08/26/21 Heel Right Unstageable - Full thickness tissue loss in which the base of the injury is covered by  slough (yellow, tan, gray,  green or brown) and/or eschar (tan, brown or black) in the wound bed. (Active)  08/26/21 0232  Location: Heel  Location Orientation: Right  Staging: Unstageable - Full thickness tissue loss in which the base of the injury is covered by slough (yellow, tan, gray, green or brown) and/or eschar (tan, brown or black) in the wound bed.  Wound Description (Comments):   Present on Admission: Yes     Pressure Injury 10/22/21 Toe (Comment  which one) Anterior;Left Unstageable - Full thickness tissue loss in which the base of the injury is covered by slough (yellow, tan, gray, green or brown) and/or eschar (tan, brown or black) in the wound bed. Right  (Active)  10/22/21 1640  Location: Toe (Comment  which one)  Location Orientation: Anterior;Left  Staging: Unstageable - Full thickness tissue loss in which the base of the injury is covered by slough (yellow, tan, gray, green or brown) and/or eschar (tan, brown or black) in the wound bed.  Wound Description (Comments): Right great toe Black wound bed  Present on Admission: Yes     Pressure Injury 10/22/21 Toe (Comment  which one) Anterior;Left Unstageable - Full thickness tissue loss in which the base of the injury is covered by slough (yellow, tan, gray, green or brown) and/or eschar (tan, brown or black) in the wound bed. left s (Active)  10/22/21 1640  Location: Toe (Comment  which one)  Location Orientation: Anterior;Left  Staging: Unstageable - Full thickness tissue loss in which the base of the injury is covered by slough (yellow, tan, gray, green or brown) and/or eschar (tan, brown or black) in the wound bed.  Wound Description (Comments): left second toe, black  Present on Admission: Yes     Pressure Injury 10/22/21 Toe (Comment  which one) Anterior;Right Unstageable - Full thickness tissue loss in which the base of the injury is covered by slough (yellow, tan, gray, green or brown) and/or eschar (tan, brown or black)  in the wound bed. right (Active)  10/22/21 1600  Location: Toe (Comment  which one)  Location Orientation: Anterior;Right  Staging: Unstageable - Full thickness tissue loss in which the base of the injury is covered by slough (yellow, tan, gray, green or brown) and/or eschar (tan, brown or black) in the wound bed.  Wound Description (Comments): right second toe, black  Present on Admission: Yes     Pressure Injury 10/22/21 Penis Distal Stage 2 -  Partial thickness loss of dermis presenting as a shallow open injury with a red, pink wound bed without slough. (Active)  10/22/21 1640  Location: Penis  Location Orientation: Distal  Staging: Stage 2 -  Partial thickness loss of dermis presenting as a shallow open injury with a red, pink wound bed without slough.  Wound Description (Comments):   Present on Admission:    BMI: Estimated body mass index is 23.73 kg/m as calculated from the following:   Height as of 09/20/21: 6' (1.829 m).   Weight as of this encounter: 79.4 kg.    DVT Prophylaxis: SQ Lovenox Procedures: None Consults: None Code Status:Full code  Family Communication: None at bedside   Disposition Plan: Status is: Inpatient Remains inpatient appropriate because: Awaiting SNF  Planned Discharge Destination: Skilled nursing facility    Diet: Diet Order             Diet regular Room service appropriate? No; Fluid consistency: Thin  Diet effective now  Antimicrobial agents: Anti-infectives (From admission, onward)    Start     Dose/Rate Route Frequency Ordered Stop   10/26/21 1400  amoxicillin (AMOXIL) capsule 500 mg        500 mg Oral Every 8 hours 10/26/21 1150 10/28/21 2145   10/25/21 1200  Ampicillin-Sulbactam (UNASYN) 3 g in sodium chloride 0.9 % 100 mL IVPB  Status:  Discontinued        3 g 200 mL/hr over 30 Minutes Intravenous Every 6 hours 10/25/21 1112 10/26/21 1150   10/24/21 2200  cefTRIAXone (ROCEPHIN) 2 g in sodium chloride  0.9 % 100 mL IVPB  Status:  Discontinued        2 g 200 mL/hr over 30 Minutes Intravenous Every 24 hours 10/24/21 1108 10/25/21 1112   10/24/21 2200  vancomycin (VANCOREADY) IVPB 1250 mg/250 mL  Status:  Discontinued        1,250 mg 166.7 mL/hr over 90 Minutes Intravenous Every 12 hours 10/24/21 1128 10/25/21 1112   10/23/21 0500  vancomycin (VANCOREADY) IVPB 1750 mg/350 mL  Status:  Discontinued        1,750 mg 175 mL/hr over 120 Minutes Intravenous Every 24 hours 10/22/21 0446 10/24/21 1128   10/22/21 2200  cefTRIAXone (ROCEPHIN) 1 g in sodium chloride 0.9 % 100 mL IVPB  Status:  Discontinued        1 g 200 mL/hr over 30 Minutes Intravenous Every 24 hours 10/22/21 0513 10/24/21 1108   10/22/21 0515  vancomycin (VANCOREADY) IVPB 1500 mg/300 mL        1,500 mg 150 mL/hr over 120 Minutes Intravenous  Once 10/22/21 0444 10/22/21 0728   10/22/21 0515  cefTRIAXone (ROCEPHIN) 1 g in sodium chloride 0.9 % 100 mL IVPB  Status:  Discontinued        1 g 200 mL/hr over 30 Minutes Intravenous Every 24 hours 10/22/21 0505 10/22/21 0513   10/22/21 0030  cefTRIAXone (ROCEPHIN) 2 g in sodium chloride 0.9 % 100 mL IVPB        2 g 200 mL/hr over 30 Minutes Intravenous  Once 10/22/21 0028 10/22/21 0142        MEDICATIONS: Scheduled Meds:  Chlorhexidine Gluconate Cloth  6 each Topical Daily   enoxaparin (LOVENOX) injection  40 mg Subcutaneous Q24H   feeding supplement  237 mL Oral BID BM   folic acid  1 mg Oral Daily   Gerhardt's butt cream   Topical TID   lactulose  10 g Oral Daily   magnesium oxide  400 mg Oral BID   multivitamin with minerals  1 tablet Oral Daily   nicotine  21 mg Transdermal Daily   nutrition supplement (JUVEN)  1 packet Oral BID BM   nystatin cream   Topical BID   Continuous Infusions:  sodium chloride 100 mL/hr at 11/02/21 0959   PRN Meds:.Zinc Oxide   I have personally reviewed following labs and imaging studies  LABORATORY DATA: CBC: No results for input(s):  WBC, NEUTROABS, HGB, HCT, MCV, PLT in the last 168 hours.  Basic Metabolic Panel: Recent Labs  Lab 10/27/21 0844 10/28/21 0107 10/30/21 1012 11/01/21 1357 11/02/21 0221  NA 135 133* 130* 129* 135  K 3.7 3.6 4.2 4.7 3.6  CL 104 102 99 98 106  CO2 24 24 21* 19* 20*  GLUCOSE 85 113* 121* 73 116*  BUN <5* <5* 7 8 8   CREATININE 0.47* 0.63 0.65 0.63 0.95  CALCIUM 8.3* 8.1* 8.7* 8.9 8.5*  MG 1.7 1.8  --   --   --  GFR: Estimated Creatinine Clearance: 96.4 mL/min (by C-G formula based on SCr of 0.95 mg/dL).  Liver Function Tests: No results for input(s): AST, ALT, ALKPHOS, BILITOT, PROT, ALBUMIN in the last 168 hours. No results for input(s): LIPASE, AMYLASE in the last 168 hours. No results for input(s): AMMONIA in the last 168 hours.  Coagulation Profile: No results for input(s): INR, PROTIME in the last 168 hours.  Cardiac Enzymes: No results for input(s): CKTOTAL, CKMB, CKMBINDEX, TROPONINI in the last 168 hours.  BNP (last 3 results) No results for input(s): PROBNP in the last 8760 hours.  Lipid Profile: No results for input(s): CHOL, HDL, LDLCALC, TRIG, CHOLHDL, LDLDIRECT in the last 72 hours.  Thyroid Function Tests: No results for input(s): TSH, T4TOTAL, FREET4, T3FREE, THYROIDAB in the last 72 hours.  Anemia Panel: No results for input(s): VITAMINB12, FOLATE, FERRITIN, TIBC, IRON, RETICCTPCT in the last 72 hours.  Urine analysis:    Component Value Date/Time   COLORURINE AMBER (A) 10/22/2021 0026   APPEARANCEUR CLEAR 10/22/2021 0026   LABSPEC 1.024 10/22/2021 0026   PHURINE 5.0 10/22/2021 0026   GLUCOSEU NEGATIVE 10/22/2021 0026   HGBUR NEGATIVE 10/22/2021 0026   BILIRUBINUR NEGATIVE 10/22/2021 0026   KETONESUR 5 (A) 10/22/2021 0026   PROTEINUR NEGATIVE 10/22/2021 0026   NITRITE NEGATIVE 10/22/2021 0026   LEUKOCYTESUR TRACE (A) 10/22/2021 0026    Sepsis Labs: Lactic Acid, Venous    Component Value Date/Time   LATICACIDVEN 0.9 10/23/2021 1140     MICROBIOLOGY: Recent Results (from the past 240 hour(s))  Culture, blood (routine x 2)     Status: None   Collection Time: 10/25/21  9:08 AM   Specimen: BLOOD LEFT HAND  Result Value Ref Range Status   Specimen Description BLOOD LEFT HAND  Final   Special Requests   Final    BOTTLES DRAWN AEROBIC ONLY Blood Culture results may not be optimal due to an inadequate volume of blood received in culture bottles   Culture   Final    NO GROWTH 5 DAYS Performed at [facility], [place] [address], [place], [place] [address]    Report Status 10/30/2021 FINAL  Final  Culture, blood (routine x 2)     Status: None   Collection Time: 10/25/21  9:09 AM   Specimen: BLOOD RIGHT HAND  Result Value Ref Range Status   Specimen Description BLOOD RIGHT HAND  Final   Special Requests   Final    BOTTLES DRAWN AEROBIC ONLY Blood Culture results may not be optimal due to an inadequate volume of blood received in culture bottles   Culture   Final    NO GROWTH 5 DAYS Performed at [facility], [place] [address] [place], [place] [address]    Report Status 10/30/2021 FINAL  Final    RADIOLOGY STUDIES/RESULTS: No results found.   LOS: 11 days   Oren Binet, MD  [facility]    To contact the attending provider between 7A-7P or the covering provider during after hours 7P-7A, please log into the web site [email or web address] and access using universal [place] password for that web site. If you do not have the password, please call the hospital operator.  11/02/2021, 12:04 PM   p

## 2021-11-02 NOTE — Assessment & Plan Note (Addendum)
Due to sepsis/bacteremia/alcohol withdrawal-neuro imaging negative-encephalopathy has resolved-he is back to baseline.

## 2021-11-02 NOTE — Assessment & Plan Note (Signed)
Now asymptomatic-has completed 10 days of isolation.

## 2021-11-03 DIAGNOSIS — R5381 Other malaise: Secondary | ICD-10-CM | POA: Diagnosis present

## 2021-11-03 NOTE — TOC Progression Note (Signed)
Transition of Care [facility]) - Progression Note    Patient Details  Name: Dylan Weaver MRN: [id number] Date of Birth: September 10, 1966  Transition [facility] Care [facility]) CM/SW Contact  Harriet Masson, RN Phone Number: 11/03/2021, 3:19 PM  Clinical Narrative:    Spoke to patient regarding transition needs. Patient is agreeable to discharge home tomorrow Patient is agreeable to HH-PT/OT/aide. Patient defers to [facility] to find home health agency. Called Cory with [facility] and referral accepted.  Orders for 3&1 and walker DME [facility] [place] [facility] Also [facility] the [facility] for [facility] to [place] [facility]. Spoke to [employer], and said patient takes Benedetto Goad when he needs to get to appointments. She states he has a car but it isn't drivable  Patient will need transportation home once discharged.  Expected Discharge Plan: Home w Home Health Services Barriers to Discharge: Continued Medical Work up  Expected Discharge Plan and Services Expected Discharge Plan: Home w Home Health Services In-house Referral: Clinical Social Work Discharge Planning Services: CM Consult Post Acute Care Choice: Home Health, Durable Medical Equipment Living arrangements for the past 2 months: Single Family Home                 DME Arranged: Dan Humphreys, 3-N-1 DME Agency: Other - Comment ([facility] [place]) Date DME Agency Contacted: 11/03/21 Time DME Agency Contacted: [phone] Representative spoke with at DME Agency: Colin Mulders HH Arranged: PT, OT, Nurse's Aide HH Agency: [facility] Date [facility] Agency Contacted: 11/03/21 Time HH Agency Contacted: [phone] Representative spoke with at [facility] Agency: Kandee Keen   Social Determinants of Health (SDOH) Interventions    Readmission Risk Interventions No flowsheet data found.

## 2021-11-03 NOTE — Progress Notes (Signed)
Order from Dr. Jerral Ralph to remove sutures from patient's forehead. Patient agreeable. Sutures were removed with no complications. Skin was clean, dry and intact.

## 2021-11-03 NOTE — Progress Notes (Signed)
Occupational Therapy Treatment Patient Details Name: Dylan Weaver MRN: [id number] DOB: 11/04/1965 Today's Date: 11/03/2021   History of present illness 56 year old male presented to the ER 10/22/21 after being found down on the floor. Had been unable to get off the floor x 4 days when housekeeper found him. +incontinence of urine and feces with irritant contact dermatitis over bil anterior, medial thighs and perineum; sepsis; +CIWA  PMH chronic alcohol abuse, alcoholic cirrhosis, malnutrition,   OT comments  Pt making progress with functional goals. Pt more alert, jovial but still requires cues for safety during ADL mobility tasks. Pt focused on getting OOB sit - stand to RW min A, walking to bathroom, toilet transfers, toileting, grooming/hygiene at sink. Pt declined sitting in recliner at end of session. OT will continue to follow acutely to maximize level of function and safety   Recommendations for follow up therapy are one component of a multi-disciplinary discharge planning process, led by the attending physician.  Recommendations may be updated based on patient status, additional functional criteria and insurance authorization.    Follow Up Recommendations  Skilled nursing-short term rehab (<3 hours/day)    Assistance Recommended at Discharge Frequent or constant Supervision/Assistance  Patient can return home with the following  Direct supervision/assist for medications management;Assist for transportation;Help with stairs or ramp for entrance;Assistance with cooking/housework;A little help with walking and/or transfers;A little help with bathing/dressing/bathroom   Equipment Recommendations  BSC/3in1    Recommendations for Other Services      Precautions / Restrictions Precautions Precautions: Fall;Other (comment) Precaution Comments: poor safety awareness Restrictions Weight Bearing Restrictions: No       Mobility Bed Mobility Overal bed mobility: Needs  Assistance Bed Mobility: Supine to Sit, Sit to Supine     Supine to sit: Supervision Sit to supine: Supervision   General bed mobility comments: pr declined sitting up in recliner    Transfers Overall transfer level: Needs assistance Equipment used: Rolling walker (2 wheels) Transfers: Sit to/from Stand Sit to Stand: Min assist           General transfer comment: required cues for hand placement (pt pulling up in RW, ditching RW at sink after walking out of bathroomo)     Balance Overall balance assessment: Needs assistance Sitting-balance support: No upper extremity supported, Feet supported Sitting balance-Leahy Scale: Fair     Standing balance support: Bilateral upper extremity supported, During functional activity Standing balance-Leahy Scale: Poor                             ADL either performed or assessed with clinical judgement   ADL Overall ADL's : Needs assistance/impaired     Grooming: Wash/dry hands;Wash/dry face;Oral care;Brushing hair;Min guard;Standing;Cueing for safety           Upper Body Dressing : Min guard;Standing   Lower Body Dressing: Min guard;Sitting/lateral leans Lower Body Dressing Details (indicate cue type and reason): to don socks seated EOB Toilet Transfer: Minimal assistance;Cueing for safety;Rolling walker (2 wheels);Ambulation;Regular Toilet;Grab bars   Toileting- Clothing Manipulation and Hygiene: Minimal assistance;Sit to/from stand       Functional mobility during ADLs: Minimal assistance;Cueing for safety;Cueing for sequencing;Rolling walker (2 wheels)      Extremity/Trunk Assessment Upper Extremity Assessment Upper Extremity Assessment: Generalized weakness   Lower Extremity Assessment Lower Extremity Assessment: Defer to PT evaluation   Cervical / Trunk Assessment Cervical / Trunk Assessment: Kyphotic    Vision Baseline Vision/History: 0 No visual  deficits Ability to See in Adequate Light: 0  Adequate Patient Visual Report: No change from baseline     Perception     Praxis      Cognition Arousal/Alertness: Awake/alert Behavior During Therapy: Flat affect, Impulsive Overall Cognitive Status: No family/caregiver present to determine baseline cognitive functioning Area of Impairment: Memory, Safety/judgement, Awareness, Problem solving                 Orientation Level:  (pt states that he is in the hospital after he fell from drinking too much ETOH)   Memory: Decreased short-term memory Following Commands: Follows multi-step commands inconsistently Safety/Judgement: Decreased awareness of safety, Decreased awareness of deficits   Problem Solving: Requires verbal cues, Decreased initiation General Comments: more alert, jovial. Required mutliple verbal cues to slow pace of speed of mobility whne suing RW to walk to bathroom and to sink        Exercises      Shoulder Instructions       General Comments      Pertinent Vitals/ Pain       Pain Assessment Pain Assessment: No/denies pain Pain Score: 0-No pain Pain Intervention(s): Monitored during session, Repositioned  Home Living                                          Prior Functioning/Environment              Frequency  Min 2X/week        Progress Toward Goals  OT Goals(current goals can now be found in the care plan section)  Progress towards OT goals: Progressing toward goals     Plan Discharge plan remains appropriate;Frequency remains appropriate    Co-evaluation                 AM-PAC OT "6 Clicks" Daily Activity     Outcome Measure   Help from another person eating meals?: None Help from another person taking care of personal grooming?: A Little Help from another person toileting, which includes using toliet, bedpan, or urinal?: A Little Help from another person bathing (including washing, rinsing, drying)?: A Lot Help from another person to put on  and taking off regular upper body clothing?: A Little Help from another person to put on and taking off regular lower body clothing?: A Lot 6 Click Score: 17    End of Session Equipment Utilized During Treatment: Gait belt;Rolling walker (2 wheels)  OT Visit Diagnosis: Unsteadiness on feet (R26.81);Other abnormalities of gait and mobility (R26.89);Muscle weakness (generalized) (M62.81);Other symptoms and signs involving cognitive function;Pain   Activity Tolerance Patient tolerated treatment well   Patient Left with call bell/phone within reach;in bed;with bed alarm set   Nurse Communication          Time: 2094-7096 OT Time Calculation (min): 18 min  Charges: OT General Charges $OT Visit: 1 Visit OT Treatments $Self Care/Home Management : 8-22 mins   Galen Manila 11/03/2021, 1:54 PM

## 2021-11-03 NOTE — Assessment & Plan Note (Addendum)
Due to acute illness-debility seems to be rapidly improving-Per patient-at baseline he uses a cane/walker to walk.  He thinks that he is now not very close to baseline-and is agreeable to be discharged home with home health services.  Evaluated by PT-recommendations were for SNF--however per social worker-patient difficult to place due to insurance issues.  This MD and case management spoke with patient's aunt Chip Boer yesterday-and made aware of discharge plans today.  Family to provide supervision intermittently-however per patient's aunt-patient has a longstanding history of noncompliance/alcohol use-and has numerous visits to the ED for alcohol intoxication.

## 2021-11-03 NOTE — Progress Notes (Signed)
PROGRESS NOTE        PATIENT DETAILS Name: Dylan Weaver Age: 56 y.o. Sex: male Date of Birth: Feb 03, 1966 Admit Date: 10/22/2021 Admitting Physician Kristopher Oppenheim, DO [id number], [place]  Brief Summary: 56 year old with history of EtOH use, cirrhosis-was found down on the floor of his house-covered with feces/urine-he was found to have COVID-19 infection, rhabdomyolysis, cellulitis of his bilateral thighs and staff epidermidis/Enterococcus bacteremia.  He was managed with supportive care-he has stabilized and is now awaiting SNF placement.  See below for further details.  Significant imaging studies: 1/21>> CT head: No acute abnormalities 1/21>> CT C-spine: No fracture/dislocation 1/21>> CT chest/abdomen/pelvis: No acute traumatic injury to chest/abdomen/pelvis.  Multiple old left rib fractures.  Significant microbiology data: 1/21>> COVID/flu PCR: Negative 1/21>> urine culture: Negative 1/21>> 1/2 blood culture: Positive for staph hominis, Enterococcus raffinosus 1/24>> blood culture: Negative  Subjective: Awake/alert-acknowledges improvement in debility/deconditioning.  Objective: Vitals: Blood pressure 97/63, pulse 79, temperature 98.3 F (36.8 C), temperature source Oral, resp. rate 18, weight 79.4 kg, SpO2 98 %.   Exam: Gen Exam:Alert awake-not in any distress HEENT:atraumatic, normocephalic Chest: B/L clear to auscultation anteriorly CVS:S1S2 regular Abdomen:soft non tender, non distended Extremities:no edema Neurology: Non focal Skin: no rash   Pertinent Labs/Radiology: CBC Latest Ref Rng & Units 10/26/2021 10/25/2021 10/24/2021  WBC 4.0 - 10.5 K/uL 4.8 4.5 3.8(L)  Hemoglobin 13.0 - 17.0 g/dL 11.4(L) 12.2(L) 13.0  Hematocrit 39.0 - 52.0 % 35.1(L) 36.4(L) 40.3  Platelets 150 - 400 K/uL 152 104(L) 106(L)    Lab Results  Component Value Date   NA 135 11/02/2021   K 3.6 11/02/2021   CL 106 11/02/2021   CO2 20 (L) 11/02/2021       Assessment/Plan: * Cellulitis- (present on admission) Admit to observation telemetry bed. IV rocephin, IV Vancomycin.  Sepsis with acute organ dysfunction without septic shock due to bilateral thigh/pubic area cellulitis and enterococcal bacteremia Sepsis physiology resolved resolved-repeat blood cultures negative-completed a course of antibiotics.  ID consulted during this hospital stay.  Acute metabolic encephalopathy Due to sepsis/bacteremia-neuro imaging negative-encephalopathy has resolved-he is back to baseline.  Rhabdomyolysis Improved with IVF.  Elevated LFTs- (present on admission) Multifactorial etiology-from rhabdomyolysis/alcoholic hepatitis and underlying cirrhosis.  LFTs have stabilized-repeat electrolytes periodically  Alcohol use disorder with alcohol withdrawal - (present on admission) No signs of withdrawal-out of window for any withdrawal symptoms.  Managed with CIWA protocol  Hyponatremia Due to dehydration-resolved-recheck electrolytes periodically.  Hypokalemia/hypomagnesemia Repleted-recheck periodically.  Irritant contact dermatitis due to incontinence of both feces and urine- (present on admission) Significant improvement in dermatitis/erythematic area in bilateral thighs-minimal erythema persists on exam.  COVID-19 virus infection- (present on admission) Now asymptomatic-has completed 10 days of isolation.  Debility- (present on admission) Due to acute illness-seems to be improving-awaiting SNF placement but per social work-may be difficult to get him to SNF.  Since seems to be improving-discussed with patient about the possibility that he may have to go home with maximal home health-he seems to be agreeable-but will discuss with family to see how much of social support he has.  Hopefully he will get a SNF bed-but if it is difficult-we will have to consider getting him home with maximal home health/family support.  Tobacco abuse- (present on  admission) Chronic.   Nutrition Status: Nutrition Problem: Severe Malnutrition Etiology: social / environmental circumstances (EtOH abuse) Signs/Symptoms: severe  muscle depletion, severe fat depletion Interventions: Ensure Enlive (each supplement provides 350kcal and 20 grams of protein), MVI, Juven  Pressure Injury 08/26/21 Buttocks Right Unstageable - Full thickness tissue loss in which the base of the injury is covered by slough (yellow, tan, gray, green or brown) and/or eschar (tan, brown or black) in the wound bed. (Active)  08/26/21 0231  Location: Buttocks  Location Orientation: Right  Staging: Unstageable - Full thickness tissue loss in which the base of the injury is covered by slough (yellow, tan, gray, green or brown) and/or eschar (tan, brown or black) in the wound bed.  Wound Description (Comments):   Present on Admission: Yes     Pressure Injury 08/26/21 Heel Right Unstageable - Full thickness tissue loss in which the base of the injury is covered by slough (yellow, tan, gray, green or brown) and/or eschar (tan, brown or black) in the wound bed. (Active)  08/26/21 0232  Location: Heel  Location Orientation: Right  Staging: Unstageable - Full thickness tissue loss in which the base of the injury is covered by slough (yellow, tan, gray, green or brown) and/or eschar (tan, brown or black) in the wound bed.  Wound Description (Comments):   Present on Admission: Yes     Pressure Injury 10/22/21 Toe (Comment  which one) Anterior;Left Unstageable - Full thickness tissue loss in which the base of the injury is covered by slough (yellow, tan, gray, green or brown) and/or eschar (tan, brown or black) in the wound bed. Right  (Active)  10/22/21 1640  Location: Toe (Comment  which one)  Location Orientation: Anterior;Left  Staging: Unstageable - Full thickness tissue loss in which the base of the injury is covered by slough (yellow, tan, gray, green or brown) and/or eschar (tan, brown  or black) in the wound bed.  Wound Description (Comments): Right great toe Black wound bed  Present on Admission: Yes     Pressure Injury 10/22/21 Toe (Comment  which one) Anterior;Left Unstageable - Full thickness tissue loss in which the base of the injury is covered by slough (yellow, tan, gray, green or brown) and/or eschar (tan, brown or black) in the wound bed. left s (Active)  10/22/21 1640  Location: Toe (Comment  which one)  Location Orientation: Anterior;Left  Staging: Unstageable - Full thickness tissue loss in which the base of the injury is covered by slough (yellow, tan, gray, green or brown) and/or eschar (tan, brown or black) in the wound bed.  Wound Description (Comments): left second toe, black  Present on Admission: Yes     Pressure Injury 10/22/21 Toe (Comment  which one) Anterior;Right Unstageable - Full thickness tissue loss in which the base of the injury is covered by slough (yellow, tan, gray, green or brown) and/or eschar (tan, brown or black) in the wound bed. right (Active)  10/22/21 1600  Location: Toe (Comment  which one)  Location Orientation: Anterior;Right  Staging: Unstageable - Full thickness tissue loss in which the base of the injury is covered by slough (yellow, tan, gray, green or brown) and/or eschar (tan, brown or black) in the wound bed.  Wound Description (Comments): right second toe, black  Present on Admission: Yes     Pressure Injury 10/22/21 Penis Distal Stage 2 -  Partial thickness loss of dermis presenting as a shallow open injury with a red, pink wound bed without slough. (Active)  10/22/21 1640  Location: Penis  Location Orientation: Distal  Staging: Stage 2 -  Partial thickness loss of  dermis presenting as a shallow open injury with a red, pink wound bed without slough.  Wound Description (Comments):   Present on Admission:    BMI: Estimated body mass index is 23.73 kg/m as calculated from the following:   Height as of 09/20/21: 6'  (1.829 m).   Weight as of this encounter: 79.4 kg.    DVT Prophylaxis: SQ Lovenox Procedures: None Consults: None Code Status:Full code  Family Communication: Aunt-[phone]   Disposition Plan: Status is: Inpatient Remains inpatient appropriate because: Awaiting SNF  Planned Discharge Destination: Skilled nursing facility    Diet: Diet Order             Diet regular Room service appropriate? Yes; Fluid consistency: Thin  Diet effective now                     Antimicrobial agents: Anti-infectives (From admission, onward)    Start     Dose/Rate Route Frequency Ordered Stop   10/26/21 1400  amoxicillin (AMOXIL) capsule 500 mg        500 mg Oral Every 8 hours 10/26/21 1150 10/28/21 2145   10/25/21 1200  Ampicillin-Sulbactam (UNASYN) 3 g in sodium chloride 0.9 % 100 mL IVPB  Status:  Discontinued        3 g 200 mL/hr over 30 Minutes Intravenous Every 6 hours 10/25/21 1112 10/26/21 1150   10/24/21 2200  cefTRIAXone (ROCEPHIN) 2 g in sodium chloride 0.9 % 100 mL IVPB  Status:  Discontinued        2 g 200 mL/hr over 30 Minutes Intravenous Every 24 hours 10/24/21 1108 10/25/21 1112   10/24/21 2200  vancomycin (VANCOREADY) IVPB 1250 mg/250 mL  Status:  Discontinued        1,250 mg 166.7 mL/hr over 90 Minutes Intravenous Every 12 hours 10/24/21 1128 10/25/21 1112   10/23/21 0500  vancomycin (VANCOREADY) IVPB 1750 mg/350 mL  Status:  Discontinued        1,750 mg 175 mL/hr over 120 Minutes Intravenous Every 24 hours 10/22/21 0446 10/24/21 1128   10/22/21 2200  cefTRIAXone (ROCEPHIN) 1 g in sodium chloride 0.9 % 100 mL IVPB  Status:  Discontinued        1 g 200 mL/hr over 30 Minutes Intravenous Every 24 hours 10/22/21 0513 10/24/21 1108   10/22/21 0515  vancomycin (VANCOREADY) IVPB 1500 mg/300 mL        1,500 mg 150 mL/hr over 120 Minutes Intravenous  Once 10/22/21 0444 10/22/21 0728   10/22/21 0515  cefTRIAXone (ROCEPHIN) 1 g in sodium chloride 0.9 % 100 mL IVPB   Status:  Discontinued        1 g 200 mL/hr over 30 Minutes Intravenous Every 24 hours 10/22/21 0505 10/22/21 0513   10/22/21 0030  cefTRIAXone (ROCEPHIN) 2 g in sodium chloride 0.9 % 100 mL IVPB        2 g 200 mL/hr over 30 Minutes Intravenous  Once 10/22/21 0028 10/22/21 0142        MEDICATIONS: Scheduled Meds:  Chlorhexidine Gluconate Cloth  6 each Topical Daily   enoxaparin (LOVENOX) injection  40 mg Subcutaneous Q24H   feeding supplement  237 mL Oral BID BM   folic acid  1 mg Oral Daily   Gerhardt's butt cream   Topical TID   lactulose  10 g Oral Daily   magnesium oxide  400 mg Oral BID   multivitamin with minerals  1 tablet Oral Daily   nicotine  21  mg Transdermal Daily   nutrition supplement (JUVEN)  1 packet Oral BID BM   nystatin cream   Topical BID   Continuous Infusions:   PRN Meds:.Zinc Oxide   I have personally reviewed following labs and imaging studies  LABORATORY DATA: CBC: No results for input(s): WBC, NEUTROABS, HGB, HCT, MCV, PLT in the last 168 hours.  Basic Metabolic Panel: Recent Labs  Lab 10/28/21 0107 10/30/21 1012 11/01/21 1357 11/02/21 0221  NA 133* 130* 129* 135  K 3.6 4.2 4.7 3.6  CL 102 99 98 106  CO2 24 21* 19* 20*  GLUCOSE 113* 121* 73 116*  BUN <5* 7 8 8   CREATININE 0.63 0.65 0.63 0.95  CALCIUM 8.1* 8.7* 8.9 8.5*  MG 1.8  --   --   --     GFR: Estimated Creatinine Clearance: 96.4 mL/min (by C-G formula based on SCr of 0.95 mg/dL).  Liver Function Tests: No results for input(s): AST, ALT, ALKPHOS, BILITOT, PROT, ALBUMIN in the last 168 hours. No results for input(s): LIPASE, AMYLASE in the last 168 hours. No results for input(s): AMMONIA in the last 168 hours.  Coagulation Profile: No results for input(s): INR, PROTIME in the last 168 hours.  Cardiac Enzymes: No results for input(s): CKTOTAL, CKMB, CKMBINDEX, TROPONINI in the last 168 hours.  BNP (last 3 results) No results for input(s): PROBNP in the last 8760  hours.  Lipid Profile: No results for input(s): CHOL, HDL, LDLCALC, TRIG, CHOLHDL, LDLDIRECT in the last 72 hours.  Thyroid Function Tests: No results for input(s): TSH, T4TOTAL, FREET4, T3FREE, THYROIDAB in the last 72 hours.  Anemia Panel: No results for input(s): VITAMINB12, FOLATE, FERRITIN, TIBC, IRON, RETICCTPCT in the last 72 hours.  Urine analysis:    Component Value Date/Time   COLORURINE AMBER (A) 10/22/2021 0026   APPEARANCEUR CLEAR 10/22/2021 0026   LABSPEC 1.024 10/22/2021 0026   PHURINE 5.0 10/22/2021 0026   GLUCOSEU NEGATIVE 10/22/2021 0026   HGBUR NEGATIVE 10/22/2021 0026   BILIRUBINUR NEGATIVE 10/22/2021 0026   KETONESUR 5 (A) 10/22/2021 0026   PROTEINUR NEGATIVE 10/22/2021 0026   NITRITE NEGATIVE 10/22/2021 0026   LEUKOCYTESUR TRACE (A) 10/22/2021 0026    Sepsis Labs: Lactic Acid, Venous    Component Value Date/Time   LATICACIDVEN 0.9 10/23/2021 1140    MICROBIOLOGY: Recent Results (from the past 240 hour(s))  Culture, blood (routine x 2)     Status: None   Collection Time: 10/25/21  9:08 AM   Specimen: BLOOD LEFT HAND  Result Value Ref Range Status   Specimen Description BLOOD LEFT HAND  Final   Special Requests   Final    BOTTLES DRAWN AEROBIC ONLY Blood Culture results may not be optimal due to an inadequate volume of blood received in culture bottles   Culture   Final    NO GROWTH 5 DAYS Performed at [facility], [place] [address] [place], [place] [address]    Report Status 10/30/2021 FINAL  Final  Culture, blood (routine x 2)     Status: None   Collection Time: 10/25/21  9:09 AM   Specimen: BLOOD RIGHT HAND  Result Value Ref Range Status   Specimen Description BLOOD RIGHT HAND  Final   Special Requests   Final    BOTTLES DRAWN AEROBIC ONLY Blood Culture results may not be optimal due to an inadequate volume of blood received in culture bottles   Culture   Final    NO GROWTH 5 DAYS Performed at  [facility], [place] [address] [place], [place] [address]    Report Status 10/30/2021 FINAL  Final    RADIOLOGY STUDIES/RESULTS: No results found.   LOS: 12 days   Oren Binet, MD  [facility]    To contact the attending provider between 7A-7P or the covering provider during after hours 7P-7A, please log into the web site [email or web address] and access using universal [place] password for that web site. If you do not have the password, please call the hospital operator.  11/03/2021, 12:20 PM   p

## 2021-11-04 ENCOUNTER — Other Ambulatory Visit (HOSPITAL_COMMUNITY): Payer: Self-pay

## 2021-11-04 DIAGNOSIS — L0390 Cellulitis, unspecified: Secondary | ICD-10-CM

## 2021-11-04 MED ORDER — MAGNESIUM OXIDE 400 MG PO TABS
800.0000 mg | ORAL_TABLET | Freq: Two times a day (BID) | ORAL | 0 refills | Status: DC
  Filled 2021-11-04: qty 30, 8d supply, fill #0

## 2021-11-04 MED ORDER — FOLIC ACID 1 MG PO TABS
1.0000 mg | ORAL_TABLET | Freq: Every day | ORAL | 1 refills | Status: DC
  Filled 2021-11-04: qty 30, 30d supply, fill #0

## 2021-11-04 MED ORDER — CYANOCOBALAMIN 1000 MCG PO TABS
1000.0000 ug | ORAL_TABLET | Freq: Every day | ORAL | 1 refills | Status: DC
  Filled 2021-11-04: qty 30, 30d supply, fill #0

## 2021-11-04 MED ORDER — ZINC OXIDE 12.8 % EX OINT
1.0000 "application " | TOPICAL_OINTMENT | Freq: Two times a day (BID) | CUTANEOUS | 0 refills | Status: DC | PRN
  Filled 2021-11-04: qty 56.7, 28d supply, fill #0

## 2021-11-04 MED ORDER — GERHARDT'S BUTT CREAM
1.0000 "application " | TOPICAL_CREAM | Freq: Three times a day (TID) | CUTANEOUS | 0 refills | Status: DC
  Filled 2021-11-04: qty 1, 1d supply, fill #0

## 2021-11-04 MED ORDER — CERTAVITE/ANTIOXIDANTS PO TABS
1.0000 | ORAL_TABLET | Freq: Every day | ORAL | 1 refills | Status: DC
  Filled 2021-11-04: qty 30, 30d supply, fill #0

## 2021-11-04 NOTE — TOC Transition Note (Signed)
Transition of Care [facility]) - CM/SW Discharge Note   Patient Details  Name: Dylan Weaver MRN: [id number] Date of Birth: 06-Feb-1966  [facility]) CM/SW Contact:  Durenda Guthrie, RN Phone Number: 11/04/2021, 11:54 AM   Clinical Narrative:   Case Manager contacted to arrange transport home for patient. Confirmed with patient that he has keys to access his home. [facility]AR called and patient scheduled for pickup within the hour. Bedside nurse notified. Medical Necessity form has been completed. Patient's Home Health services were arranged by previous CM.TOC pharmacy has delivered meds to patient.  No further needs identified.       Barriers to Discharge: Continued Medical Work up   Patient Goals and CMS Choice Patient states their goals for this hospitalization and ongoing recovery are:: Patient is agreeable to discharging home CMS Medicare.gov Compare Post Acute Care list provided to:: Patient Choice offered to / list presented to : Patient  Discharge Placement                       Discharge Plan and Services In-house Referral: Clinical Social Work Discharge Planning Services: CM Consult Post Acute Care Choice: Home Health, Durable Medical Equipment          DME Arranged: Dan Humphreys, 3-N-1 DME Agency: Other - Comment ([facility] [place] Date DME Agency Contacted: 11/03/21 Time DME Agency Contacted: [phone] Representative spoke with at DME Agency: Colin Mulders HH Arranged: PT, OT, Nurse's Aide HH Agency: [facility] Date [facility] Agency Contacted: 11/03/21 Time HH Agency Contacted: [phone] Representative spoke with at [facility] Agency: Kandee Keen  Social Determinants of Health (SDOH) Interventions     Readmission Risk Interventions No flowsheet data found.

## 2021-11-04 NOTE — Discharge Summary (Signed)
PATIENT DETAILS Name: Dylan Weaver Age: 56 y.o. Sex: male Date of Birth: 1966-06-06 MRN: [id number]. Admitting Physician: Kristopher Oppenheim, DO GL:[id number], [place] [place]  Admit Date: 10/22/2021 Discharge date: 11/04/2021  Recommendations for Outpatient Follow-up:  Follow up with PCP in 1-2 weeks Please obtain CMP/CBC in one week Continue counseling regarding stopping alcohol use.  Admitted From:  Home  Disposition: Home health   Discharge Condition: good  CODE STATUS:   Code Status: Full Code   Diet recommendation:  Diet Order             Diet - low sodium heart healthy           Diet general           Diet regular Room service appropriate? Yes; Fluid consistency: Thin  Diet effective now                    Brief Summary: 56 year old with history of EtOH use, cirrhosis-was found down on the floor of his house-covered with feces/urine-he was found to have COVID-19 infection, rhabdomyolysis, cellulitis of his bilateral thighs and staff epidermidis/Enterococcus bacteremia.  He was managed with supportive care-he has stabilized and is now awaiting SNF placement.  See below for further details.  Significant imaging studies: 1/21>> CT head: No acute abnormalities 1/21>> CT C-spine: No fracture/dislocation 1/21>> CT chest/abdomen/pelvis: No acute traumatic injury to chest/abdomen/pelvis.  Multiple old left rib fractures.  Significant microbiology data: 1/21>> COVID/flu PCR: Negative 1/21>> urine culture: Negative 1/21>> 1/2 blood culture: Positive for staph hominis, Enterococcus raffinosus 1/24>> blood culture: Negative  Brief Hospital Course: Sepsis with acute organ dysfunction without septic shock due to bilateral thigh/pubic area cellulitis and enterococcal bacteremia- (present on admission) Sepsis physiology resolved resolved-repeat blood cultures negative-completed a course of antibiotics.  ID consulted during this hospital stay.  Acute metabolic  encephalopathy Due to sepsis/bacteremia/alcohol withdrawal-neuro imaging negative-encephalopathy has resolved-he is back to baseline.  Rhabdomyolysis Improved with IVF.  Elevated LFTs- (present on admission) Multifactorial etiology-from rhabdomyolysis/alcoholic hepatitis and underlying cirrhosis.  LFTs have stabilized-repeat electrolytes periodically  Alcohol use disorder with alcohol withdrawal - (present on admission) No signs of withdrawal-out of window for any withdrawal symptoms.  Managed with CIWA protocol.  Has been counseled extensively regarding importance of stopping further alcohol use.  He claims he already is aware of local AA programs.  Hyponatremia Due to dehydration-resolved-recheck electrolytes periodically.  Hypokalemia/hypomagnesemia Repleted-recheck periodically.  Irritant contact dermatitis due to incontinence of both feces and urine- (present on admission) Significant improvement in dermatitis/erythematic area in bilateral thighs-minimal erythema persists on exam.  COVID-19 virus infection- (present on admission) Now asymptomatic-has completed 10 days of isolation.  Debility- (present on admission) Due to acute illness-debility seems to be rapidly improving-Per patient-at baseline he uses a cane/walker to walk.  He thinks that he is now not very close to baseline-and is agreeable to be discharged home with home health services.  Evaluated by PT-recommendations were for SNF--however per social worker-patient difficult to place due to insurance issues.  This MD and case management spoke with patient's aunt Jocelyn Lamer yesterday-and made aware of discharge plans today.  Family to provide supervision intermittently-however per patient's aunt-patient has a longstanding history of noncompliance/alcohol use-and has numerous visits to the ED for alcohol intoxication.  Tobacco abuse- (present on admission) Chronic.   Nutrition Status: Nutrition Problem: Severe  Malnutrition Etiology: social / environmental circumstances (EtOH abuse) Signs/Symptoms: severe muscle depletion, severe fat depletion Interventions: Ensure Enlive (each supplement provides 350kcal and 20 grams  of protein), MVI, Juven    Obesity: Estimated body mass index is 23.73 kg/m as calculated from the following:   Height as of 09/20/21: 6' (1.829 m).   Weight as of this encounter: 79.4 kg.   RN pressure injury documentation: Pressure Injury 08/26/21 Buttocks Right Unstageable - Full thickness tissue loss in which the base of the injury is covered by slough (yellow, tan, gray, green or brown) and/or eschar (tan, brown or black) in the wound bed. (Active)  08/26/21 0231  Location: Buttocks  Location Orientation: Right  Staging: Unstageable - Full thickness tissue loss in which the base of the injury is covered by slough (yellow, tan, gray, green or brown) and/or eschar (tan, brown or black) in the wound bed.  Wound Description (Comments):   Present on Admission: Yes     Pressure Injury 08/26/21 Heel Right Unstageable - Full thickness tissue loss in which the base of the injury is covered by slough (yellow, tan, gray, green or brown) and/or eschar (tan, brown or black) in the wound bed. (Active)  08/26/21 0232  Location: Heel  Location Orientation: Right  Staging: Unstageable - Full thickness tissue loss in which the base of the injury is covered by slough (yellow, tan, gray, green or brown) and/or eschar (tan, brown or black) in the wound bed.  Wound Description (Comments):   Present on Admission: Yes     Pressure Injury 10/22/21 Toe (Comment  which one) Anterior;Left Unstageable - Full thickness tissue loss in which the base of the injury is covered by slough (yellow, tan, gray, green or brown) and/or eschar (tan, brown or black) in the wound bed. Right  (Active)  10/22/21 1640  Location: Toe (Comment  which one)  Location Orientation: Anterior;Left  Staging: Unstageable - Full  thickness tissue loss in which the base of the injury is covered by slough (yellow, tan, gray, green or brown) and/or eschar (tan, brown or black) in the wound bed.  Wound Description (Comments): Right great toe Black wound bed  Present on Admission: Yes     Pressure Injury 10/22/21 Toe (Comment  which one) Anterior;Left Unstageable - Full thickness tissue loss in which the base of the injury is covered by slough (yellow, tan, gray, green or brown) and/or eschar (tan, brown or black) in the wound bed. left s (Active)  10/22/21 1640  Location: Toe (Comment  which one)  Location Orientation: Anterior;Left  Staging: Unstageable - Full thickness tissue loss in which the base of the injury is covered by slough (yellow, tan, gray, green or brown) and/or eschar (tan, brown or black) in the wound bed.  Wound Description (Comments): left second toe, black  Present on Admission: Yes     Pressure Injury 10/22/21 Toe (Comment  which one) Anterior;Right Unstageable - Full thickness tissue loss in which the base of the injury is covered by slough (yellow, tan, gray, green or brown) and/or eschar (tan, brown or black) in the wound bed. right (Active)  10/22/21 1600  Location: Toe (Comment  which one)  Location Orientation: Anterior;Right  Staging: Unstageable - Full thickness tissue loss in which the base of the injury is covered by slough (yellow, tan, gray, green or brown) and/or eschar (tan, brown or black) in the wound bed.  Wound Description (Comments): right second toe, black  Present on Admission: Yes     Pressure Injury 10/22/21 Penis Distal Stage 2 -  Partial thickness loss of dermis presenting as a shallow open injury with a red, pink wound  bed without slough. (Active)  10/22/21 1640  Location: Penis  Location Orientation: Distal  Staging: Stage 2 -  Partial thickness loss of dermis presenting as a shallow open injury with a red, pink wound bed without slough.  Wound Description (Comments):    Present on Admission:    Discharge Diagnoses:  Active Problems:   Sepsis with acute organ dysfunction without septic shock due to bilateral thigh/pubic area cellulitis and enterococcal bacteremia   Acute metabolic encephalopathy   Alcohol use disorder with alcohol withdrawal    Elevated LFTs   Rhabdomyolysis   Hyponatremia   Hypokalemia/hypomagnesemia   Irritant contact dermatitis due to incontinence of both feces and urine   COVID-19 virus infection   Debility   Alcohol withdrawal (HCC)   Tobacco abuse   Hypomagnesemia   Pressure injury of skin   Enterococcal bacteremia   Discharge Instructions:  Activity:  As tolerated with Full fall precautions use walker/cane & assistance as needed   Discharge Instructions     Call MD for:  persistant nausea and vomiting   Complete by: As directed    Call MD for:  redness, tenderness, or signs of infection (pain, swelling, redness, odor or green/yellow discharge around incision site)   Complete by: As directed    Diet - low sodium heart healthy   Complete by: As directed    Diet general   Complete by: As directed    Discharge instructions   Complete by: As directed    Follow with Primary MD  [facility] in 1-2 weeks  Please get a complete blood count and chemistry panel checked by your Primary MD at your next visit, and again as instructed by your Primary MD.  Get Medicines reviewed and adjusted: Please take all your medications with you for your next visit with your Primary MD  Laboratory/radiological data: Please request your Primary MD to go over all hospital tests and procedure/radiological results at the follow up, please ask your Primary MD to get all Hospital records sent to his/her office.  In some cases, they will be blood work, cultures and biopsy results pending at the time of your discharge. Please request that your primary care M.D. follows up on these results.  Also Note the following: If you  experience worsening of your admission symptoms, develop shortness of breath, life threatening emergency, suicidal or homicidal thoughts you must seek medical attention immediately by calling 911 or calling your MD immediately  if symptoms less severe.  You must read complete instructions/literature along with all the possible adverse reactions/side effects for all the Medicines you take and that have been prescribed to you. Take any new Medicines after you have completely understood and accpet all the possible adverse reactions/side effects.   Do not drive when taking Pain medications or sleeping medications (Benzodaizepines)  Do not take more than prescribed Pain, Sleep and Anxiety Medications. It is not advisable to combine anxiety,sleep and pain medications without talking with your primary care practitioner  Special Instructions: If you have smoked or chewed Tobacco  in the last 2 yrs please stop smoking, stop any regular Alcohol  and or any Recreational drug use.  Wear Seat belts while driving.  Please note: You were cared for by a hospitalist during your hospital stay. Once you are discharged, your primary care physician will handle any further medical issues. Please note that NO REFILLS for any discharge medications will be authorized once you are discharged, as it is imperative that you return to  your primary care physician (or establish a relationship with a primary care physician if you do not have one) for your post hospital discharge needs so that they can reassess your need for medications and monitor your lab values.   Stop further alcohol use.   Increase activity slowly   Complete by: As directed    No wound care   Complete by: As directed       Allergies as of 11/04/2021   No Known Allergies      Medication List     STOP taking these medications    chlordiazePOXIDE 25 MG capsule Commonly known as: LIBRIUM   collagenase ointment Commonly known as: SANTYL    famotidine 40 MG tablet Commonly known as: PEPCID   gabapentin 100 MG capsule Commonly known as: NEURONTIN   magnesium oxide 400 (240 Mg) MG tablet Commonly known as: MAG-OX Replaced by: magnesium oxide 400 MG tablet       TAKE these medications    CertaVite/Antioxidants Tabs Take 1 tablet by mouth daily before breakfast.   cyanocobalamin 1000 MCG tablet Take 1 tablet (1,000 mcg total) by mouth daily.   folic acid 1 MG tablet Commonly known as: FOLVITE Take 1 tablet (1 mg total) by mouth daily.   Gerhardt's butt cream Crea Apply 1 application topically 3 (three) times daily.   magnesium oxide 400 MG tablet Commonly known as: MAG-OX Take 2 tablets (800 mg total) by mouth 2 (two) times daily. Replaces: magnesium oxide 400 (240 Mg) MG tablet   Zinc Oxide 12.8 % ointment Commonly known as: TRIPLE PASTE Apply 1 application topically 2 (two) times daily as needed. What changed:  how much to take when to take this reasons to take this               Durable Medical Equipment  (From admission, onward)           Start     Ordered   11/03/21 1413  For home use only DME Bedside commode  Once       Question:  Patient needs a bedside commode to treat with the following condition  Answer:  Physical deconditioning   11/03/21 1412   11/03/21 1413  For home use only DME 3 n 1  Once        11/03/21 1412            No Known Allergies   Other Procedures/Studies: CT HEAD WO CONTRAST (5MM)  Result Date: 10/22/2021 CLINICAL DATA:  Found down, head and neck trauma. EXAM: CT HEAD WITHOUT CONTRAST CT CERVICAL SPINE WITHOUT CONTRAST TECHNIQUE: Multidetector CT imaging of the head and cervical spine was performed following the standard protocol without intravenous contrast. Multiplanar CT image reconstructions of the cervical spine were also generated. RADIATION DOSE REDUCTION: This exam was performed according to the departmental dose-optimization program which  includes automated exposure control, adjustment of the mA and/or kV according to patient size and/or use of iterative reconstruction technique. COMPARISON:  09/20/2021. FINDINGS: CT HEAD FINDINGS Brain: No acute intracranial hemorrhage, midline shift or mass effect. No extra-axial fluid collection. Mild atrophy is noted. Periventricular white matter hypodensities are noted bilaterally. No hydrocephalus. Vascular: No hyperdense vessel or unexpected calcification. Skull: Normal. Negative for fracture or focal lesion. Sinuses/Orbits: No acute finding. Other: None. CT CERVICAL SPINE FINDINGS Alignment: Mild retrolisthesis at C3-C4. Skull base and vertebrae: No acute fracture. No primary bone lesion or focal pathologic process. Soft tissues and spinal canal: No prevertebral fluid or swelling.  No visible canal hematoma. Disc levels: Multilevel intervertebral disc space narrowing, disc herniation, osteophyte formation, and facet arthropathy resulting in mild to moderate stenosis, not significantly changed from the prior exam. Upper chest: Apical pleural blebs are present bilaterally. Other: None. IMPRESSION: 1. No acute intracranial process. 2. Atrophy with chronic microvascular ischemic changes. 3. Degenerative changes in the cervical spine without evidence of acute fracture. Electronically Signed   By: Brett Fairy M.D.   On: 10/22/2021 01:40   CT CERVICAL SPINE WO CONTRAST  Result Date: 10/22/2021 CLINICAL DATA:  Found down, head and neck trauma. EXAM: CT HEAD WITHOUT CONTRAST CT CERVICAL SPINE WITHOUT CONTRAST TECHNIQUE: Multidetector CT imaging of the head and cervical spine was performed following the standard protocol without intravenous contrast. Multiplanar CT image reconstructions of the cervical spine were also generated. RADIATION DOSE REDUCTION: This exam was performed according to the departmental dose-optimization program which includes automated exposure control, adjustment of the mA and/or kV  according to patient size and/or use of iterative reconstruction technique. COMPARISON:  09/20/2021. FINDINGS: CT HEAD FINDINGS Brain: No acute intracranial hemorrhage, midline shift or mass effect. No extra-axial fluid collection. Mild atrophy is noted. Periventricular white matter hypodensities are noted bilaterally. No hydrocephalus. Vascular: No hyperdense vessel or unexpected calcification. Skull: Normal. Negative for fracture or focal lesion. Sinuses/Orbits: No acute finding. Other: None. CT CERVICAL SPINE FINDINGS Alignment: Mild retrolisthesis at C3-C4. Skull base and vertebrae: No acute fracture. No primary bone lesion or focal pathologic process. Soft tissues and spinal canal: No prevertebral fluid or swelling. No visible canal hematoma. Disc levels: Multilevel intervertebral disc space narrowing, disc herniation, osteophyte formation, and facet arthropathy resulting in mild to moderate stenosis, not significantly changed from the prior exam. Upper chest: Apical pleural blebs are present bilaterally. Other: None. IMPRESSION: 1. No acute intracranial process. 2. Atrophy with chronic microvascular ischemic changes. 3. Degenerative changes in the cervical spine without evidence of acute fracture. Electronically Signed   By: Brett Fairy M.D.   On: 10/22/2021 01:40   DG Chest Port 1 View  Result Date: 10/22/2021 CLINICAL DATA:  Possible sepsis. EXAM: PORTABLE CHEST 1 VIEW COMPARISON:  08/25/2021. FINDINGS: The heart size and mediastinal contours are within normal limits. There is mild hyperinflation of the lungs. No consolidation, effusion, or pneumothorax. No acute osseous abnormality. IMPRESSION: No active disease. Electronically Signed   By: Brett Fairy M.D.   On: 10/22/2021 00:54   CT CHEST ABDOMEN PELVIS WO CONTRAST  Result Date: 10/22/2021 CLINICAL DATA:  Found down.  Polytrauma, blunt EXAM: CT CHEST, ABDOMEN AND PELVIS WITHOUT CONTRAST TECHNIQUE: Multidetector CT imaging of the chest, abdomen  and pelvis was performed following the standard protocol without IV contrast. RADIATION DOSE REDUCTION: This exam was performed according to the departmental dose-optimization program which includes automated exposure control, adjustment of the mA and/or kV according to patient size and/or use of iterative reconstruction technique. COMPARISON:  None. FINDINGS: CT CHEST FINDINGS Cardiovascular: Heart is normal size. Aorta is normal caliber. Scattered coronary artery and aortic calcifications. Mediastinum/Nodes: No mediastinal, hilar, or axillary adenopathy. Trachea and esophagus are unremarkable. Thyroid unremarkable. Lungs/Pleura: No confluent opacities, effusions or pneumothorax. Musculoskeletal: Chest wall soft tissues are unremarkable. Multiple chronic appearing left rib fractures. No definite acute rib fracture. CT ABDOMEN PELVIS FINDINGS Hepatobiliary: Numerous gallstones layering within the gallbladder. Diffuse low-density throughout the liver compatible with fatty infiltration. No evidence of a hepatic injury or perihepatic hematoma. Pancreas: No focal abnormality or ductal dilatation. Spleen: No splenic injury or perisplenic hematoma. Adrenals/Urinary  Tract: No adrenal hemorrhage or renal injury identified. Bladder is unremarkable. Stomach/Bowel: Scattered colonic diverticula. No active diverticulitis. Stomach and small bowel grossly unremarkable. Vascular/Lymphatic: Aortic atherosclerosis. No evidence of aneurysm or adenopathy. Reproductive: No visible focal abnormality. Other: No free fluid or free air. Musculoskeletal: Mild compression deformity at L1, age indeterminate. Six IMPRESSION: Multiple old left rib fractures. Age-indeterminate mild compression fracture at L1. No acute findings or evidence of significant traumatic injury in the chest, abdomen or pelvis. Cholelithiasis. Hepatic steatosis. Coronary artery disease, aortic atherosclerosis. Electronically Signed   By: Rolm Baptise M.D.   On:  10/22/2021 01:37     TODAY-DAY OF DISCHARGE:  Subjective:   Magda Bernheim today has no headache,no chest abdominal pain,no new weakness tingling or numbness, feels much better wants to go home today.  Objective:   Blood pressure 95/61, pulse 72, temperature 97.8 F (36.6 C), temperature source Oral, resp. rate 18, weight 79.4 kg, SpO2 95 %.  Intake/Output Summary (Last 24 hours) at 11/04/2021 1040 Last data filed at 11/04/2021 0900 Gross per 24 hour  Intake --  Output 1000 ml  Net -1000 ml   Filed Weights   10/24/21 1100  Weight: 79.4 kg    Exam: Awake Alert, Oriented *3, No new F.N deficits, Normal affect .AT,PERRAL Supple Neck,No JVD, No cervical lymphadenopathy appriciated.  Symmetrical Chest wall movement, Good air movement bilaterally, CTAB RRR,No Gallops,Rubs or new Murmurs, No Parasternal Heave +ve B.Sounds, Abd Soft, Non tender, No organomegaly appriciated, No rebound -guarding or rigidity. No Cyanosis, Clubbing or edema, No new Rash or bruise   PERTINENT RADIOLOGIC STUDIES: No results found.   PERTINENT LAB RESULTS: CBC: No results for input(s): WBC, HGB, HCT, PLT in the last 72 hours. CMET CMP     Component Value Date/Time   NA 135 11/02/2021 0221   K 3.6 11/02/2021 0221   CL 106 11/02/2021 0221   CO2 20 (L) 11/02/2021 0221   GLUCOSE 116 (H) 11/02/2021 0221   BUN 8 11/02/2021 0221   CREATININE 0.95 11/02/2021 0221   CALCIUM 8.5 (L) 11/02/2021 0221   PROT 5.9 (L) 10/26/2021 0424   ALBUMIN 1.7 (L) 10/26/2021 0424   AST 65 (H) 10/26/2021 0424   ALT 46 (H) 10/26/2021 0424   ALKPHOS 142 (H) 10/26/2021 0424   BILITOT 0.6 10/26/2021 0424   GFRNONAA >60 11/02/2021 0221    GFR Estimated Creatinine Clearance: 96.4 mL/min (by C-G formula based on SCr of 0.95 mg/dL). No results for input(s): LIPASE, AMYLASE in the last 72 hours. No results for input(s): CKTOTAL, CKMB, CKMBINDEX, TROPONINI in the last 72 hours. Invalid input(s): POCBNP No results for  input(s): DDIMER in the last 72 hours. No results for input(s): HGBA1C in the last 72 hours. No results for input(s): CHOL, HDL, LDLCALC, TRIG, CHOLHDL, LDLDIRECT in the last 72 hours. No results for input(s): TSH, T4TOTAL, T3FREE, THYROIDAB in the last 72 hours.  Invalid input(s): FREET3 No results for input(s): VITAMINB12, FOLATE, FERRITIN, TIBC, IRON, RETICCTPCT in the last 72 hours. Coags: No results for input(s): INR in the last 72 hours.  Invalid input(s): PT Microbiology: No results found for this or any previous visit (from the past 240 hour(s)).  FURTHER DISCHARGE INSTRUCTIONS:  Get Medicines reviewed and adjusted: Please take all your medications with you for your next visit with your Primary MD  Laboratory/radiological data: Please request your Primary MD to go over all hospital tests and procedure/radiological results at the follow up, please ask your Primary MD to get all  Hospital records sent to his/her office.  In some cases, they will be blood work, cultures and biopsy results pending at the time of your discharge. Please request that your primary care M.D. goes through all the records of your hospital data and follows up on these results.  Also Note the following: If you experience worsening of your admission symptoms, develop shortness of breath, life threatening emergency, suicidal or homicidal thoughts you must seek medical attention immediately by calling 911 or calling your MD immediately  if symptoms less severe.  You must read complete instructions/literature along with all the possible adverse reactions/side effects for all the Medicines you take and that have been prescribed to you. Take any new Medicines after you have completely understood and accpet all the possible adverse reactions/side effects.   Do not drive when taking Pain medications or sleeping medications (Benzodaizepines)  Do not take more than prescribed Pain, Sleep and Anxiety Medications. It is  not advisable to combine anxiety,sleep and pain medications without talking with your primary care practitioner  Special Instructions: If you have smoked or chewed Tobacco  in the last 2 yrs please stop smoking, stop any regular Alcohol  and or any Recreational drug use.  Wear Seat belts while driving.  Please note: You were cared for by a hospitalist during your hospital stay. Once you are discharged, your primary care physician will handle any further medical issues. Please note that NO REFILLS for any discharge medications will be authorized once you are discharged, as it is imperative that you return to your primary care physician (or establish a relationship with a primary care physician if you do not have one) for your post hospital discharge needs so that they can reassess your need for medications and monitor your lab values.  Total Time spent coordinating discharge including counseling, education and face to face time equals greater than 30 minutes.  Signed: Oren Binet 11/04/2021 10:40 AM

## 2021-11-04 NOTE — Progress Notes (Signed)
Patient now requesting uber home instead of PTAR. Uber scheduled.   Joaquin Courts, MSW, [facility]

## 2021-11-04 NOTE — Progress Notes (Signed)
All of the paperwork needed for PTAR is in pt's discharge folder. [facility] has been set up for pt again.

## 2021-11-04 NOTE — Progress Notes (Signed)
Discharge paperwork reviewed with pt. Pt verbalized understanding. Medications from [facility] have been delivered to pt. Pt alert and oriented x4 in no acute distress. Awaiting for [facility] to transport pt home.

## 2021-11-04 NOTE — Progress Notes (Signed)
Occupational Therapy Treatment Patient Details Name: Dylan Weaver MRN: [id number] DOB: June 23, 1966 Today's Date: 11/04/2021   History of present illness 56 year old male presented to the ER 10/22/21 after being found down on the floor. Had been unable to get off the floor x 4 days when housekeeper found him. +incontinence of urine and feces with irritant contact dermatitis over bil anterior, medial thighs and perineum; sepsis; +CIWA  PMH chronic alcohol abuse, alcoholic cirrhosis, malnutrition,   OT comments  Pt in bed, alert, but still requires cues for safety during ADL mobility tasks. Session focused on getting OOB sit - stand to RW min A, walking to bathroom, toilet transfers, toileting, bathing, dressing and grooming/hygiene at sink. Pt declined sitting in recliner at end of session. Pt eager to d/c home today.   Recommendations for follow up therapy are one component of a multi-disciplinary discharge planning process, led by the attending physician.  Recommendations may be updated based on patient status, additional functional criteria and insurance authorization.    Follow Up Recommendations  Skilled nursing-short term rehab (<3 hours/day) (HH if unable to got o SNF)    Assistance Recommended at Discharge Frequent or constant Supervision/Assistance  Patient can return home with the following  Direct supervision/assist for medications management;Assist for transportation;Help with stairs or ramp for entrance;Assistance with cooking/housework;A little help with walking and/or transfers;A little help with bathing/dressing/bathroom   Equipment Recommendations  BSC/3in1    Recommendations for Other Services      Precautions / Restrictions Precautions Precautions: Fall;Other (comment) Precaution Comments: poor safety awareness Restrictions Weight Bearing Restrictions: No       Mobility Bed Mobility Overal bed mobility: Needs Assistance Bed Mobility: Supine to Sit, Sit to  Supine       Sit to supine: Supervision   General bed mobility comments: pt declined sitting up in recliner    Transfers Overall transfer level: Needs assistance Equipment used: Rolling walker (2 wheels) Transfers: Sit to/from Stand Sit to Stand: Min assist           General transfer comment: required cues for hand placement (pt pulling up in RW, ditching RW at sink after walking out of bathroomo)     Balance Overall balance assessment: Needs assistance Sitting-balance support: No upper extremity supported, Feet supported Sitting balance-Leahy Scale: Fair     Standing balance support: Bilateral upper extremity supported, During functional activity Standing balance-Leahy Scale: Poor Standing balance comment: walker and min assist for static standing                           ADL either performed or assessed with clinical judgement   ADL Overall ADL's : Needs assistance/impaired     Grooming: Wash/dry hands;Wash/dry face;Oral care;Brushing hair;Min guard;Standing;Cueing for safety   Upper Body Bathing: Min guard;Standing   Lower Body Bathing: Minimal assistance;Sit to/from stand   Upper Body Dressing : Supervision/safety;Set up;Sitting   Lower Body Dressing: Sitting/lateral leans;Sit to/from stand;Min guard   Toilet Transfer: Minimal assistance;Cueing for safety;Rolling walker (2 wheels);Ambulation;Regular Toilet;Grab bars   Toileting- Clothing Manipulation and Hygiene: Min guard;Sit to/from stand       Functional mobility during ADLs: Minimal assistance;Cueing for safety;Cueing for sequencing;Rolling walker (2 wheels)      Extremity/Trunk Assessment Upper Extremity Assessment Upper Extremity Assessment: Generalized weakness   Lower Extremity Assessment Lower Extremity Assessment: Defer to PT evaluation   Cervical / Trunk Assessment Cervical / Trunk Assessment: Kyphotic    Vision Baseline Vision/History: 0 No  visual deficits Ability to See  in Adequate Light: 0 Adequate Patient Visual Report: No change from baseline     Perception     Praxis      Cognition Arousal/Alertness: Awake/alert Behavior During Therapy: Impulsive, WFL for tasks assessed/performed Overall Cognitive Status: No family/caregiver present to determine baseline cognitive functioning Area of Impairment: Memory, Safety/judgement, Awareness, Problem solving                       Following Commands: Follows multi-step commands inconsistently Safety/Judgement: Decreased awareness of safety, Decreased awareness of deficits     General Comments: more alert, jovial. Required mutliple verbal cues to slow pace of speed of mobility when using RW to walk to bathroom and to sink        Exercises      Shoulder Instructions       General Comments      Pertinent Vitals/ Pain       Pain Assessment Pain Assessment: No/denies pain Pain Score: 0-No pain Pain Intervention(s): Monitored during session, Repositioned  Home Living                                          Prior Functioning/Environment              Frequency  Min 2X/week        Progress Toward Goals  OT Goals(current goals can now be found in the care plan section)  Progress towards OT goals: Progressing toward goals     Plan Discharge plan remains appropriate;Frequency remains appropriate;Discharge plan needs to be updated    Co-evaluation                 AM-PAC OT "6 Clicks" Daily Activity     Outcome Measure   Help from another person eating meals?: None Help from another person taking care of personal grooming?: A Little Help from another person toileting, which includes using toliet, bedpan, or urinal?: A Little Help from another person bathing (including washing, rinsing, drying)?: A Lot Help from another person to put on and taking off regular upper body clothing?: None Help from another person to put on and taking off regular lower  body clothing?: A Lot 6 Click Score: 18    End of Session Equipment Utilized During Treatment: Gait belt;Rolling walker (2 wheels)  OT Visit Diagnosis: Unsteadiness on feet (R26.81);Other abnormalities of gait and mobility (R26.89);Muscle weakness (generalized) (M62.81);Other symptoms and signs involving cognitive function;Pain   Activity Tolerance Patient tolerated treatment well   Patient Left with call bell/phone within reach;in bed;with bed alarm set   Nurse Communication          Time: 2549-8264 OT Time Calculation (min): 26 min  Charges: OT Treatments $Self Care/Home Management : 8-22 mins $Therapeutic Activity: 8-22 mins    Galen Manila 11/04/2021, 2:48 PM

## 2022-03-21 IMAGING — CT CT HEAD W/O CM
4 series · 15 of 47 positions shown, 17 images · non-contrast
Comparison: 09/20/2021.

CLINICAL DATA: Found down, head and neck trauma.



[Series 3: head without · axial · non-contrast · 0.44mm/px · z∈[-74,+46]mm · 7 of 32 slices shown, 9 images]
[im 4/32  brain]
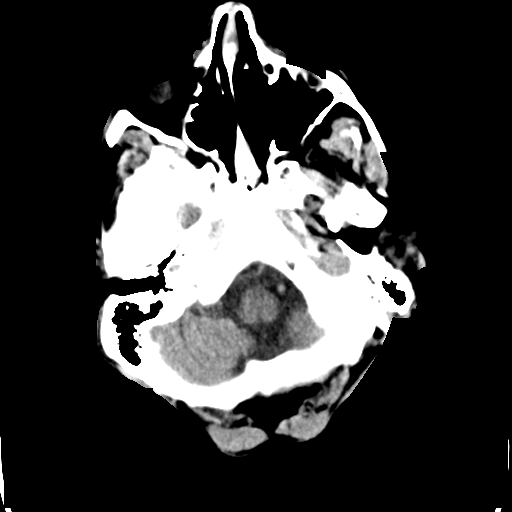
[im 4/32  bone]
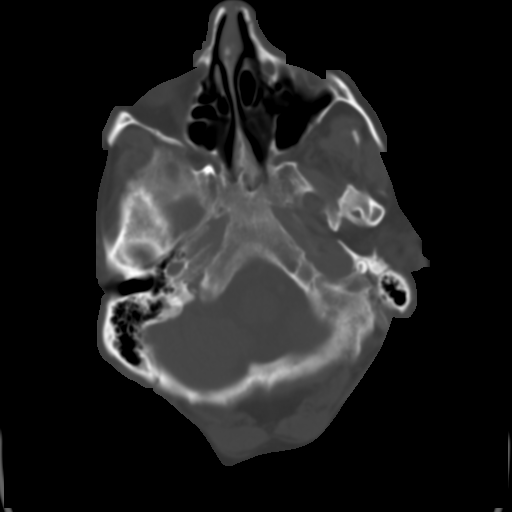
[im 8/32  brain]
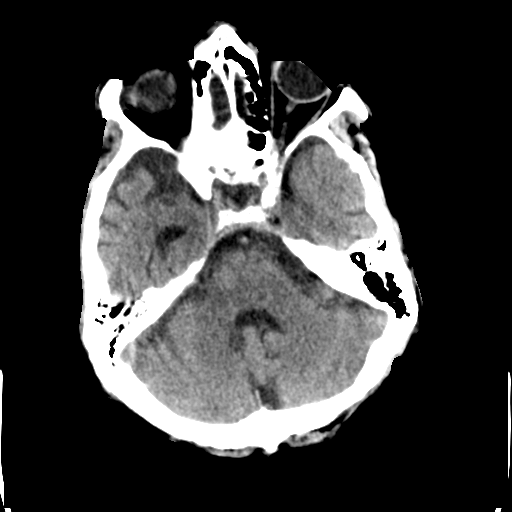
[im 12/32  brain]
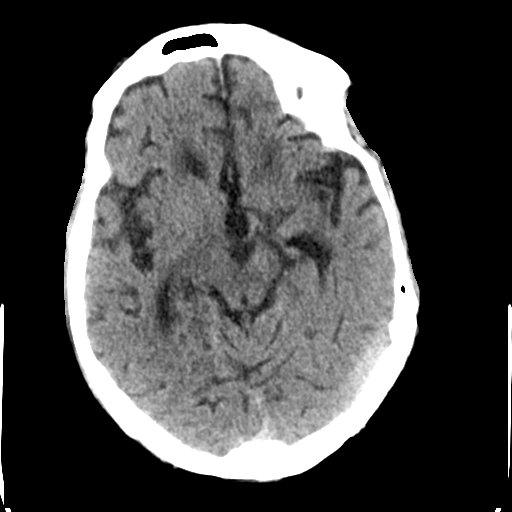
[im 16/32  brain]
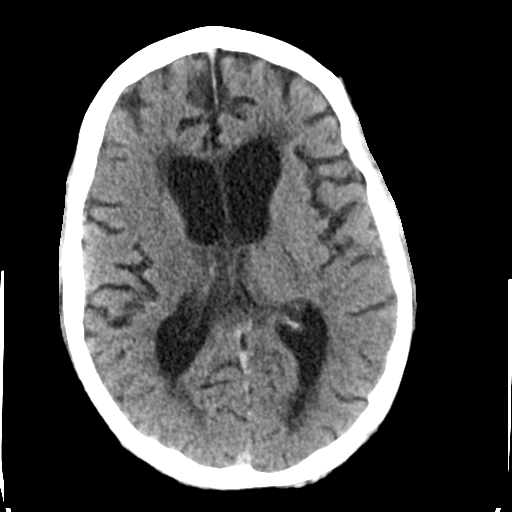
[im 20/32  brain]
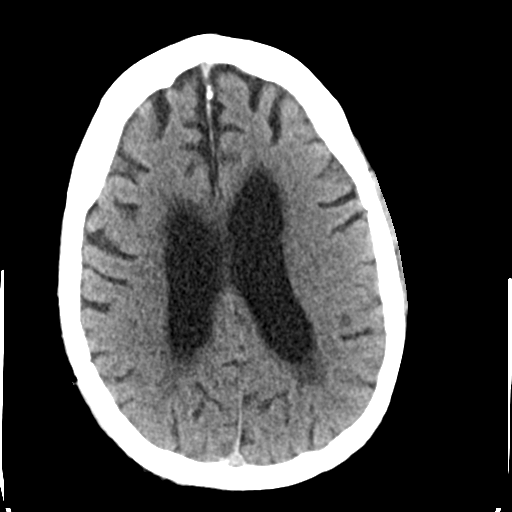
[im 20/32  bone]
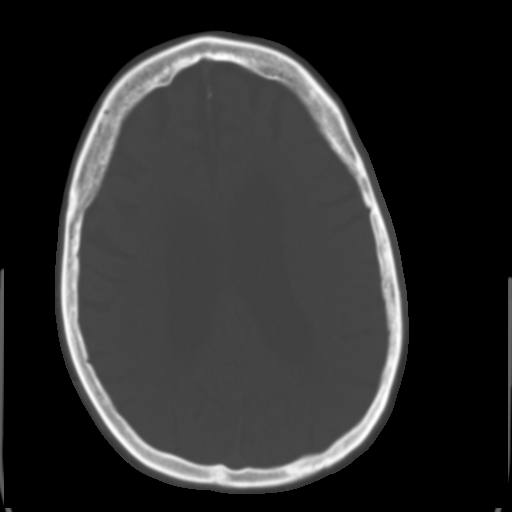
[im 24/32  brain]
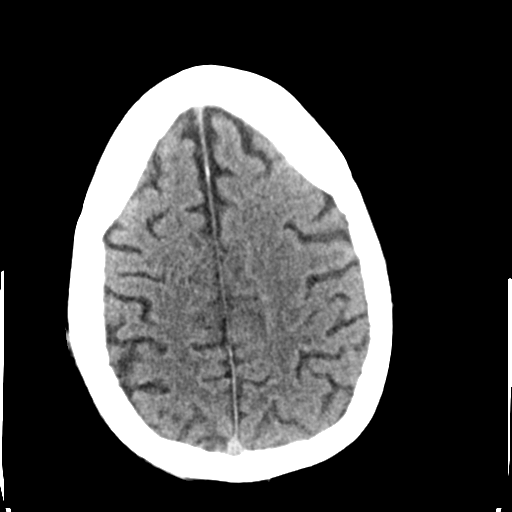
[im 28/32  brain]
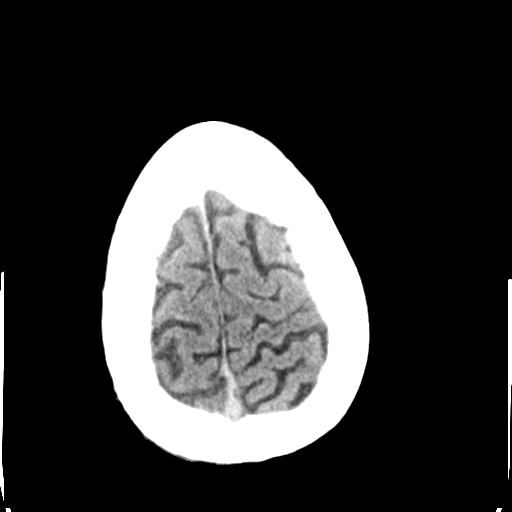

[Series 4: head bone · axial · 0.44mm/px · z∈[-75,-59]mm · 2 of 80 slices shown]
[im 8/80  bone]
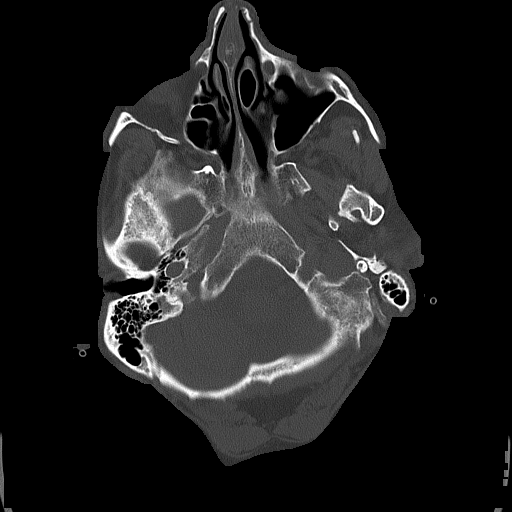
[im 16/80  bone]
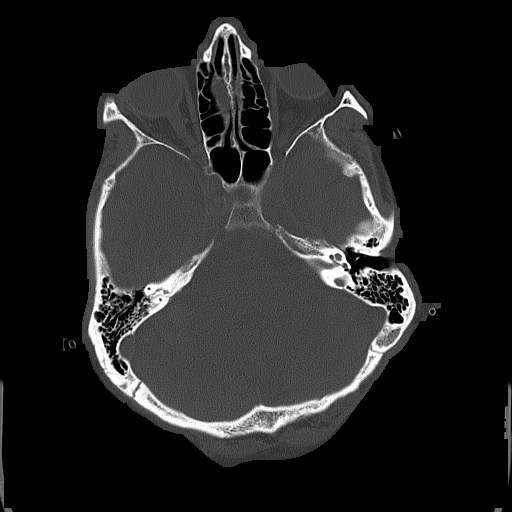

[Series 5: head without cor · coronal · non-contrast · 0.33mm/px · 3 of 75 slices shown]
[im 25/75  brain]
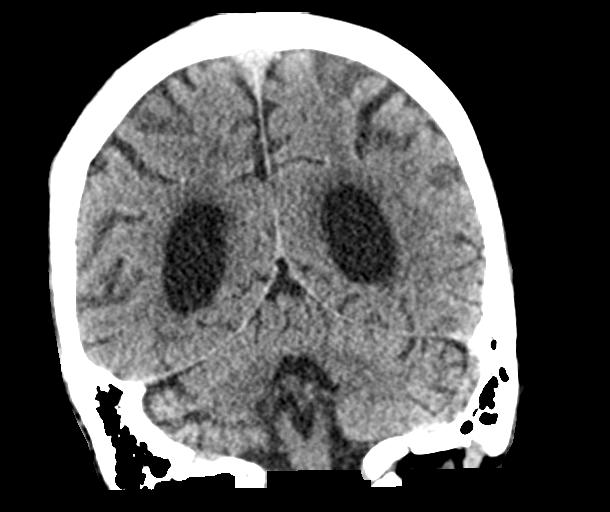
[im 33/75  brain]
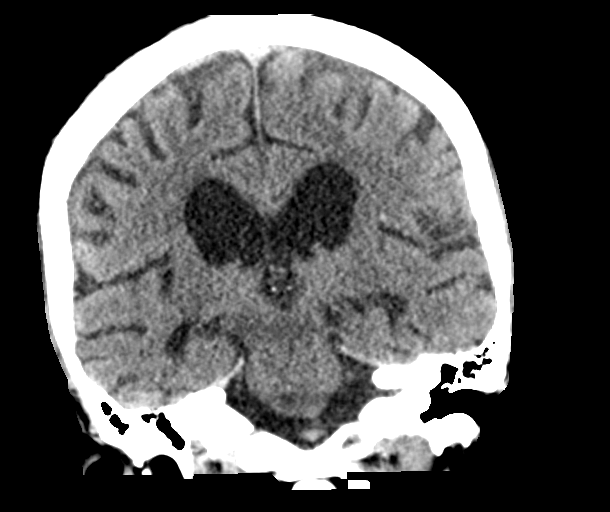
[im 42/75  brain]
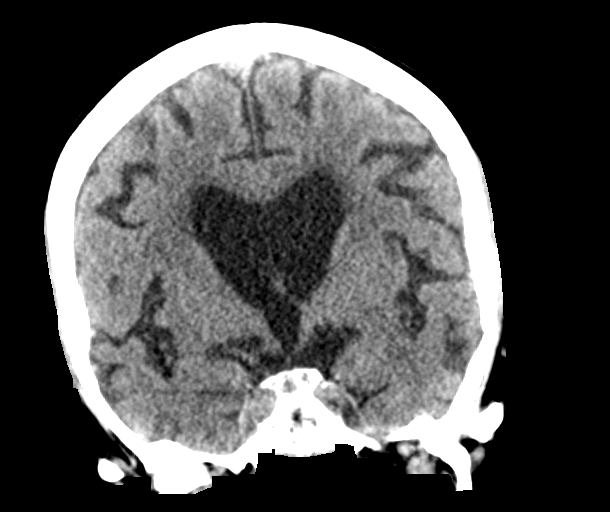

[Series 6: head without sag · sagittal · non-contrast · 0.34mm/px · 3 of 67 slices shown]
[im 23/67  brain]
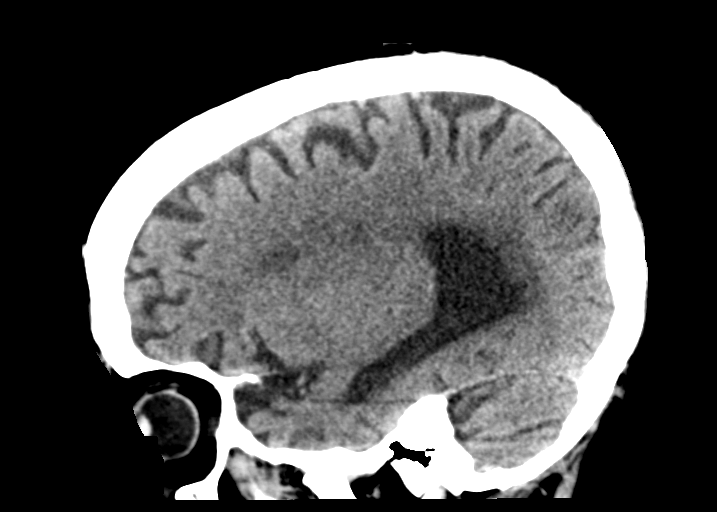
[im 34/67  brain]
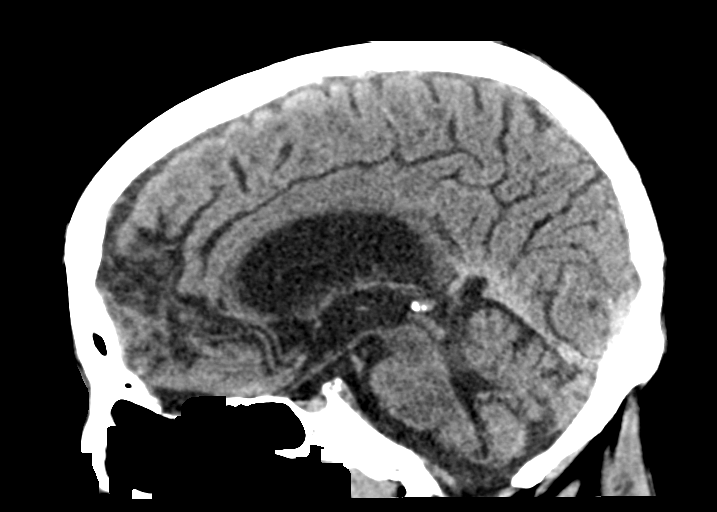
[im 45/67  brain]
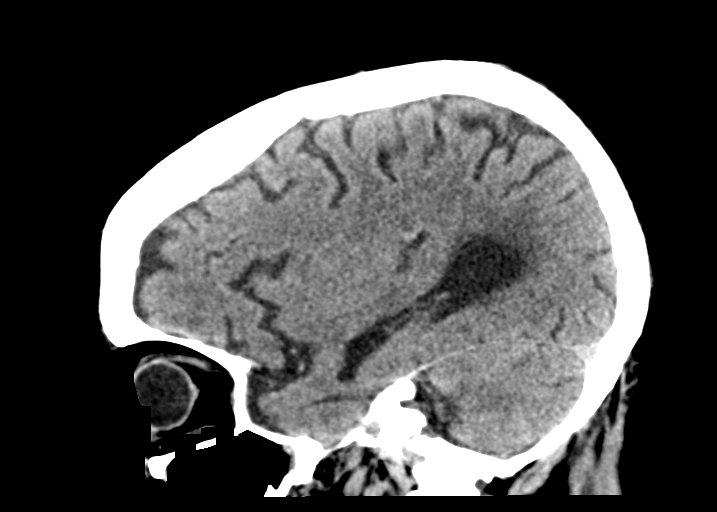

[15 of 47 positions shown; findings below may reference images not displayed]

FINDINGS: CT HEAD FINDINGS

Brain: No acute intracranial hemorrhage, midline shift or mass
effect. No extra-axial fluid collection. Mild atrophy is noted.
Periventricular white matter hypodensities are noted bilaterally. No
hydrocephalus.

Vascular: No hyperdense vessel or unexpected calcification.

Skull: Normal. Negative for fracture or focal lesion.

Sinuses/Orbits: No acute finding.

Other: None.

CT CERVICAL SPINE FINDINGS

Alignment: Mild retrolisthesis at C3-C4.

Skull base and vertebrae: No acute fracture. No primary bone lesion
or focal pathologic process.

Soft tissues and spinal canal: No prevertebral fluid or swelling. No
visible canal hematoma.

Disc levels: Multilevel intervertebral disc space narrowing, disc
herniation, osteophyte formation, and facet arthropathy resulting in
mild to moderate stenosis, not significantly changed from the prior
exam.

Upper chest: Apical pleural blebs are present bilaterally.

Other: None.
IMPRESSION: 1. No acute intracranial process.
2. Atrophy with chronic microvascular ischemic changes.
3. Degenerative changes in the cervical spine without evidence of
acute fracture.

## 2022-04-25 ENCOUNTER — Emergency Department (HOSPITAL_COMMUNITY): Payer: No Typology Code available for payment source

## 2022-04-25 ENCOUNTER — Other Ambulatory Visit: Payer: Self-pay

## 2022-04-25 ENCOUNTER — Encounter (HOSPITAL_COMMUNITY): Payer: Self-pay

## 2022-04-25 ENCOUNTER — Inpatient Hospital Stay (HOSPITAL_COMMUNITY)
Admission: EM | Admit: 2022-04-25 | Discharge: 2022-05-11 | DRG: 177 | Disposition: A | Discharge status: Home Health | Payer: No Typology Code available for payment source | Attending: Internal Medicine | Admitting: Internal Medicine

## 2022-04-25 DIAGNOSIS — E876 Hypokalemia: Secondary | ICD-10-CM | POA: Diagnosis present

## 2022-04-25 DIAGNOSIS — K8020 Calculus of gallbladder without cholecystitis without obstruction: Secondary | ICD-10-CM | POA: Diagnosis present

## 2022-04-25 DIAGNOSIS — Y92239 Unspecified place in hospital as the place of occurrence of the external cause: Secondary | ICD-10-CM | POA: Diagnosis not present

## 2022-04-25 DIAGNOSIS — Z20822 Contact with and (suspected) exposure to covid-19: Secondary | ICD-10-CM | POA: Diagnosis present

## 2022-04-25 DIAGNOSIS — F10239 Alcohol dependence with withdrawal, unspecified: Secondary | ICD-10-CM | POA: Diagnosis present

## 2022-04-25 DIAGNOSIS — A040 Enteropathogenic Escherichia coli infection: Secondary | ICD-10-CM | POA: Diagnosis not present

## 2022-04-25 DIAGNOSIS — K760 Fatty (change of) liver, not elsewhere classified: Secondary | ICD-10-CM | POA: Diagnosis present

## 2022-04-25 DIAGNOSIS — L8942 Pressure ulcer of contiguous site of back, buttock and hip, stage 2: Secondary | ICD-10-CM | POA: Diagnosis present

## 2022-04-25 DIAGNOSIS — R7989 Other specified abnormal findings of blood chemistry: Secondary | ICD-10-CM | POA: Diagnosis present

## 2022-04-25 DIAGNOSIS — R9431 Abnormal electrocardiogram [ECG] [EKG]: Secondary | ICD-10-CM | POA: Diagnosis not present

## 2022-04-25 DIAGNOSIS — J690 Pneumonitis due to inhalation of food and vomit: Principal | ICD-10-CM | POA: Diagnosis present

## 2022-04-25 DIAGNOSIS — E871 Hypo-osmolality and hyponatremia: Secondary | ICD-10-CM | POA: Diagnosis present

## 2022-04-25 DIAGNOSIS — G9341 Metabolic encephalopathy: Secondary | ICD-10-CM | POA: Diagnosis present

## 2022-04-25 DIAGNOSIS — D649 Anemia, unspecified: Secondary | ICD-10-CM | POA: Diagnosis present

## 2022-04-25 DIAGNOSIS — F1020 Alcohol dependence, uncomplicated: Secondary | ICD-10-CM | POA: Diagnosis not present

## 2022-04-25 DIAGNOSIS — K7010 Alcoholic hepatitis without ascites: Secondary | ICD-10-CM | POA: Diagnosis present

## 2022-04-25 DIAGNOSIS — F17210 Nicotine dependence, cigarettes, uncomplicated: Secondary | ICD-10-CM | POA: Diagnosis present

## 2022-04-25 DIAGNOSIS — E878 Other disorders of electrolyte and fluid balance, not elsewhere classified: Secondary | ICD-10-CM | POA: Diagnosis present

## 2022-04-25 DIAGNOSIS — K7460 Unspecified cirrhosis of liver: Secondary | ICD-10-CM | POA: Diagnosis present

## 2022-04-25 DIAGNOSIS — E8889 Other specified metabolic disorders: Secondary | ICD-10-CM | POA: Diagnosis present

## 2022-04-25 DIAGNOSIS — L8990 Pressure ulcer of unspecified site, unspecified stage: Secondary | ICD-10-CM

## 2022-04-25 DIAGNOSIS — E861 Hypovolemia: Secondary | ICD-10-CM | POA: Diagnosis present

## 2022-04-25 DIAGNOSIS — Y900 Blood alcohol level of less than 20 mg/100 ml: Secondary | ICD-10-CM | POA: Diagnosis present

## 2022-04-25 DIAGNOSIS — M4856XA Collapsed vertebra, not elsewhere classified, lumbar region, initial encounter for fracture: Secondary | ICD-10-CM | POA: Diagnosis present

## 2022-04-25 DIAGNOSIS — L8946 Pressure-induced deep tissue damage of contiguous site of back, buttock and hip: Secondary | ICD-10-CM | POA: Diagnosis present

## 2022-04-25 DIAGNOSIS — R5381 Other malaise: Secondary | ICD-10-CM | POA: Diagnosis present

## 2022-04-25 DIAGNOSIS — F1010 Alcohol abuse, uncomplicated: Secondary | ICD-10-CM | POA: Diagnosis not present

## 2022-04-25 DIAGNOSIS — Z8249 Family history of ischemic heart disease and other diseases of the circulatory system: Secondary | ICD-10-CM

## 2022-04-25 DIAGNOSIS — D696 Thrombocytopenia, unspecified: Secondary | ICD-10-CM | POA: Diagnosis not present

## 2022-04-25 DIAGNOSIS — E872 Acidosis, unspecified: Secondary | ICD-10-CM | POA: Diagnosis not present

## 2022-04-25 DIAGNOSIS — W19XXXA Unspecified fall, initial encounter: Secondary | ICD-10-CM | POA: Diagnosis not present

## 2022-04-25 DIAGNOSIS — D539 Nutritional anemia, unspecified: Secondary | ICD-10-CM | POA: Diagnosis present

## 2022-04-25 DIAGNOSIS — E538 Deficiency of other specified B group vitamins: Secondary | ICD-10-CM | POA: Diagnosis present

## 2022-04-25 DIAGNOSIS — E860 Dehydration: Secondary | ICD-10-CM | POA: Diagnosis present

## 2022-04-25 LAB — COMPREHENSIVE METABOLIC PANEL
ALT: 67 U/L — ABNORMAL HIGH (ref 0–44)
AST: 93 U/L — ABNORMAL HIGH (ref 15–41)
Albumin: 3.1 g/dL — ABNORMAL LOW (ref 3.5–5.0)
Alkaline Phosphatase: 175 U/L — ABNORMAL HIGH (ref 38–126)
Anion gap: 18 — ABNORMAL HIGH (ref 5–15)
BUN: 15 mg/dL (ref 6–20)
CO2: 29 mmol/L (ref 22–32)
Calcium: 9.3 mg/dL (ref 8.9–10.3)
Chloride: 79 mmol/L — ABNORMAL LOW (ref 98–111)
Creatinine, Ser: 0.66 mg/dL (ref 0.61–1.24)
GFR, Estimated: >60 mL/min (ref >60)
Glucose, Bld: 121 mg/dL — ABNORMAL HIGH (ref 70–99)
Potassium: 2.7 mmol/L — CL (ref 3.5–5.1)
Sodium: 126 mmol/L — ABNORMAL LOW (ref 135–145)
Total Bilirubin: 4 mg/dL — ABNORMAL HIGH (ref 0.3–1.2)
Total Protein: 8.9 g/dL — ABNORMAL HIGH (ref 6.5–8.1)

## 2022-04-25 LAB — LIPASE, BLOOD: Lipase: 38 U/L (ref 11–51)

## 2022-04-25 LAB — CBC WITH DIFFERENTIAL/PLATELET
Abs Immature Granulocytes: 0.26 10*3/uL — ABNORMAL HIGH (ref 0.00–0.07)
Basophils Absolute: 0 10*3/uL (ref 0.0–0.1)
Basophils Relative: 0 %
Eosinophils Absolute: 0 10*3/uL (ref 0.0–0.5)
Eosinophils Relative: 0 %
HCT: 26.7 % — ABNORMAL LOW (ref 39.0–52.0)
Hemoglobin: 9.6 g/dL — ABNORMAL LOW (ref 13.0–17.0)
Immature Granulocytes: 2 %
Lymphocytes Relative: 11 %
Lymphs Abs: 1.4 10*3/uL (ref 0.7–4.0)
MCH: 33.8 pg (ref 26.0–34.0)
MCHC: 36 g/dL (ref 30.0–36.0)
MCV: 94 fL (ref 80.0–100.0)
Monocytes Absolute: 0.4 10*3/uL (ref 0.1–1.0)
Monocytes Relative: 3 %
Neutro Abs: 10.9 10*3/uL — ABNORMAL HIGH (ref 1.7–7.7)
Neutrophils Relative %: 84 %
Platelets: 181 10*3/uL (ref 150–400)
RBC: 2.84 MIL/uL — ABNORMAL LOW (ref 4.22–5.81)
RDW: 14.2 % (ref 11.5–15.5)
WBC: 13 10*3/uL — ABNORMAL HIGH (ref 4.0–10.5)
nRBC: 0.3 % — ABNORMAL HIGH (ref 0.0–0.2)

## 2022-04-25 LAB — LACTIC ACID, PLASMA
Lactic Acid, Venous: 2.2 mmol/L (ref 0.5–1.9)
Lactic Acid, Venous: 2 mmol/L (ref 0.5–1.9)

## 2022-04-25 LAB — I-STAT CHEM 8, ED
BUN: 13 mg/dL (ref 6–20)
Calcium, Ion: 1.06 mmol/L — ABNORMAL LOW (ref 1.15–1.40)
Chloride: 79 mmol/L — ABNORMAL LOW (ref 98–111)
Creatinine, Ser: 0.5 mg/dL — ABNORMAL LOW (ref 0.61–1.24)
Glucose, Bld: 123 mg/dL — ABNORMAL HIGH (ref 70–99)
HCT: 30 % — ABNORMAL LOW (ref 39.0–52.0)
Hemoglobin: 10.2 g/dL — ABNORMAL LOW (ref 13.0–17.0)
Potassium: 2.8 mmol/L — ABNORMAL LOW (ref 3.5–5.1)
Sodium: 124 mmol/L — ABNORMAL LOW (ref 135–145)
TCO2: 31 mmol/L (ref 22–32)

## 2022-04-25 LAB — TROPONIN I (HIGH SENSITIVITY)
Troponin I (High Sensitivity): 3 ng/L (ref <18)
Troponin I (High Sensitivity): 2 ng/L (ref <18)

## 2022-04-25 LAB — SARS CORONAVIRUS 2 BY RT PCR: SARS Coronavirus 2 by RT PCR: NEGATIVE

## 2022-04-25 LAB — AMMONIA: Ammonia: 22 umol/L (ref 9–35)

## 2022-04-25 LAB — ETHANOL: Alcohol, Ethyl (B): <10 mg/dL (ref <10)

## 2022-04-25 LAB — PROTIME-INR
INR: 1 (ref 0.8–1.2)
Prothrombin Time: 13.3 seconds (ref 11.4–15.2)

## 2022-04-25 LAB — CK: Total CK: 44 U/L — ABNORMAL LOW (ref 49–397)

## 2022-04-25 LAB — MAGNESIUM: Magnesium: 2.1 mg/dL (ref 1.7–2.4)

## 2022-04-25 MED ORDER — LACTATED RINGERS IV BOLUS (SEPSIS)
1000.0000 mL | Freq: Once | INTRAVENOUS | Status: AC
Start: 2022-04-25 — End: 2022-04-25
  Administered 2022-04-25: 1000 mL via INTRAVENOUS

## 2022-04-25 MED ORDER — POTASSIUM CHLORIDE IN NACL 20-0.9 MEQ/L-% IV SOLN
INTRAVENOUS | Status: DC
  Filled 2022-04-25 (×2): qty 1000

## 2022-04-25 MED ORDER — ADULT MULTIVITAMIN W/MINERALS CH
1.0000 | ORAL_TABLET | Freq: Every day | ORAL | Status: DC
  Administered 2022-04-26 – 2022-05-10 (×15): 1 via ORAL
  Filled 2022-04-25 (×15): qty 1

## 2022-04-25 MED ORDER — PROCHLORPERAZINE EDISYLATE 10 MG/2ML IJ SOLN
10.0000 mg | INTRAMUSCULAR | Status: DC | PRN
Start: 2022-04-25 — End: 2022-05-11

## 2022-04-25 MED ORDER — THIAMINE HCL 100 MG/ML IJ SOLN
100.0000 mg | Freq: Every day | INTRAMUSCULAR | Status: DC
  Administered 2022-04-25: 100 mg via INTRAVENOUS
  Filled 2022-04-25: qty 2

## 2022-04-25 MED ORDER — FOLIC ACID 1 MG PO TABS: 1.0000 mg | ORAL_TABLET | Freq: Every day | ORAL | Status: DC

## 2022-04-25 MED ORDER — ACETAMINOPHEN 650 MG RE SUPP: 650.0000 mg | Freq: Four times a day (QID) | RECTAL | Status: DC | PRN

## 2022-04-25 MED ORDER — LORAZEPAM 2 MG/ML IJ SOLN: 1.0000 mg | INTRAMUSCULAR | Status: AC | PRN

## 2022-04-25 MED ORDER — LACTATED RINGERS IV SOLN: INTRAVENOUS | Status: DC

## 2022-04-25 MED ORDER — POTASSIUM CHLORIDE 20 MEQ PO PACK: 40.0000 meq | PACK | Freq: Two times a day (BID) | ORAL | Status: DC

## 2022-04-25 MED ORDER — ACETAMINOPHEN 325 MG PO TABS: 650.0000 mg | ORAL_TABLET | Freq: Four times a day (QID) | ORAL | Status: DC | PRN

## 2022-04-25 MED ORDER — LORAZEPAM 1 MG PO TABS: 1.0000 mg | ORAL_TABLET | ORAL | Status: AC | PRN

## 2022-04-25 MED ORDER — POTASSIUM CHLORIDE 20 MEQ PO PACK
40.0000 meq | PACK | Freq: Two times a day (BID) | ORAL | Status: DC
Start: 2022-04-25 — End: 2022-04-25

## 2022-04-25 MED ORDER — POTASSIUM CHLORIDE 10 MEQ/100ML IV SOLN
10.0000 meq | INTRAVENOUS | Status: AC
  Administered 2022-04-25 (×5): 10 meq via INTRAVENOUS
  Filled 2022-04-25 (×5): qty 100

## 2022-04-25 MED ORDER — AMPICILLIN-SULBACTAM SODIUM 3 (2-1) G IJ SOLR
3.0000 g | Freq: Four times a day (QID) | INTRAMUSCULAR | Status: DC
  Administered 2022-04-25 – 2022-04-28 (×12): 3 g via INTRAVENOUS
  Filled 2022-04-25 (×12): qty 8

## 2022-04-25 NOTE — ED Notes (Signed)
Bladder scanned pt, pt retains 104 mL of urine in bladder

## 2022-04-25 NOTE — ED Provider Notes (Signed)
[facility]-EMERGENCY DEPT Provider Note   CSN: [id number] Arrival date & time: 04/25/22  1352     History {Add pertinent medical, surgical, social history, OB history to HPI:1} Chief Complaint  Patient presents with   Fall   Alcohol Problem    Kasean Denherder is a 56 y.o. male.   Fall  Alcohol Problem  Patient presents for generalized weakness.  Medical history includes alcohol abuse, alcoholic hepatitis, cirrhosis.  He was reportedly found by his neighbor, lying on the floor covered in feces and surrounded by empty beer cans.  Patient reported to EMS that he fell a couple days ago.  Patient currently reports that he was only on the floor for 1 hour.  History is limited by what appears to be altered mental status.  He currently denies any areas of discomfort.     Home Medications Prior to Admission medications   Medication Sig Start Date End Date Taking? Authorizing Provider  cyanocobalamin 1000 MCG tablet Take 1 tablet (1,000 mcg total) by mouth daily. Patient not taking: Reported on 04/25/2022 11/04/21   Maretta Bees, MD  folic acid (FOLVITE) 1 MG tablet Take 1 tablet (1 mg total) by mouth daily. Patient not taking: Reported on 04/25/2022 11/04/21   Maretta Bees, MD  magnesium oxide (MAG-OX) 400 MG tablet Take 2 tablets (800 mg total) by mouth 2 (two) times daily. Patient not taking: Reported on 04/25/2022 11/04/21   Maretta Bees, MD  Multiple Vitamins-Minerals (CERTAVITE/ANTIOXIDANTS) TABS Take 1 tablet by mouth daily before breakfast. Patient not taking: Reported on 04/25/2022 11/04/21   Maretta Bees, MD  Nystatin (GERHARDT'S BUTT CREAM) CREA Apply 1 application topically 3 (three) times daily. Patient not taking: Reported on 04/25/2022 11/04/21   Maretta Bees, MD  Zinc Oxide (TRIPLE PASTE) 12.8 % ointment Apply 1 application topically 2 (two) times daily as needed. Patient not taking: Reported on 04/25/2022 11/04/21   Maretta Bees, MD      Allergies    Patient has no known allergies.    Review of Systems   Review of Systems  Unable to perform ROS: Mental status change    Physical Exam Updated Vital Signs BP 105/74   Pulse 100   Temp 97.9 F (36.6 C) (Axillary)   Resp 16   Ht 6' (1.829 m)   Wt 83.9 kg   SpO2 100%   BMI 25.09 kg/m  Physical Exam Vitals and nursing note reviewed.  Constitutional:      General: He is not in acute distress.    Appearance: He is well-developed. He is ill-appearing. He is not toxic-appearing or diaphoretic.  HENT:     Head: Normocephalic and atraumatic.     Right Ear: External ear normal.     Left Ear: External ear normal.     Nose: Nose normal.     Mouth/Throat:     Mouth: Mucous membranes are dry.  Eyes:     General: No scleral icterus.    Extraocular Movements: Extraocular movements intact.     Conjunctiva/sclera: Conjunctivae normal.  Cardiovascular:     Rate and Rhythm: Normal rate and regular rhythm.     Heart sounds: No murmur heard. Pulmonary:     Effort: Pulmonary effort is normal. No respiratory distress.     Breath sounds: Normal breath sounds. No wheezing or rales.  Chest:     Chest wall: No tenderness.  Abdominal:     Palpations: Abdomen is soft.  Tenderness: There is no abdominal tenderness.  Musculoskeletal:        General: No swelling or deformity. Normal range of motion.     Cervical back: Normal range of motion and neck supple. No rigidity or tenderness.     Right lower leg: No edema.     Left lower leg: No edema.  Skin:    General: Skin is warm and dry.     Capillary Refill: Capillary refill takes less than 2 seconds.     Coloration: Skin is pale.     Comments: Areas of stage I skin breakdown on back and buttocks suggestive of prolonged downtime on floor  Neurological:     General: No focal deficit present.     Mental Status: He is alert. He is disoriented.  Psychiatric:        Mood and Affect: Mood normal.         Behavior: Behavior normal.     ED Results / Procedures / Treatments   Labs (all labs ordered are listed, but only abnormal results are displayed) Labs Reviewed - No data to display  EKG None  Radiology No results found.  Procedures Procedures  {Document cardiac monitor, telemetry assessment procedure when appropriate:1}  Medications Ordered in ED Medications - No data to display  ED Course/ Medical Decision Making/ A&P                           Medical Decision Making  This patient presents to the ED for concern of ***, this involves an extensive number of treatment options, and is a complaint that carries with it a high risk of complications and morbidity.  The differential diagnosis includes ***   Co morbidities that complicate the patient evaluation  ***   Additional history obtained:  Additional history obtained from *** External records from outside source obtained and reviewed including ***   Lab Tests:  I Ordered, and personally interpreted labs.  The pertinent results include:  ***   Imaging Studies ordered:  I ordered imaging studies including ***  I independently visualized and interpreted imaging which showed *** I agree with the radiologist interpretation   Cardiac Monitoring: / EKG:  The patient was maintained on a cardiac monitor.  I personally viewed and interpreted the cardiac monitored which showed an underlying rhythm of: ***   Consultations Obtained:  I requested consultation with the ***,  and discussed lab and imaging findings as well as pertinent plan - they recommend: ***   Problem List / ED Course / Critical interventions / Medication management  *** I ordered medication including ***  for ***  Reevaluation of the patient after these medicines showed that the patient {resolved/improved/worsened:23923::"improved"} I have reviewed the patients home medicines and have made adjustments as needed   Social Determinants of  Health:  ***   Test / Admission - Considered:  ***   {Document critical care time when appropriate:1} {Document review of labs and clinical decision tools ie heart score, Chads2Vasc2 etc:1}  {Document your independent review of radiology images, and any outside records:1} {Document your discussion with family members, caretakers, and with consultants:1} {Document social determinants of health affecting pt's care:1} {Document your decision making why or why not admission, treatments were needed:1} Final Clinical Impression(s) / ED Diagnoses Final diagnoses:  None    Rx / DC Orders ED Discharge Orders     None

## 2022-04-25 NOTE — ED Notes (Signed)
Assumed pt care with RN oversight. Pt A&Ox4, respirations equal & unlabored.  

## 2022-04-25 NOTE — ED Notes (Signed)
Attempted to in and out pt, pt has a yellowish white blockage that prevents the in and out cath to go fully in. There was no indication of trama.

## 2022-04-25 NOTE — ED Triage Notes (Signed)
PT arrives via EMS from home. Pt was found by his neighbor laying in his floor covered in feces and empty beer cans beside him. Pt states he slipped and fell a couple a couple days ago and was unable to get off the floor. PT is AxOx4.

## 2022-04-25 NOTE — H&P (Signed)
[facility] History and Physical  Dylan Weaver [id number] DOB: [birth year] DOA: 04/25/2022   PCP: [facility]  Specialists: None  Chief Complaint: Confused  HPI: Dylan Weaver is a 56 y.o. male with a past medical history of alcohol abuse, previous history of alcoholic hepatitis who was brought into the emergency department by EMS.  EMS was called by patient's neighbor after the neighbor found the patient lying on the floor covered in feces and surrounded by empty beer cans.  Patient apparently reported to EMS that he fell 2 days prior to presentation.  Patient is confused.  History is very limited.  He complains of headache but denies any other discomfort at this time.  Does not complain of any nausea vomiting.  Once again history is very limited due to his confusion.  In the emergency department patient was found to have stable vital signs.  Blood work showed hyponatremia hypokalemia elevated LFTs and anemia.  Imaging studies did not show any obvious injuries except for rib fractures noted on CT scan.  Patient will be hospitalized for further management.  Home Medications: Prior to Admission medications   Medication Sig Start Date End Date Taking? Authorizing Provider  cyanocobalamin 1000 MCG tablet Take 1 tablet (1,000 mcg total) by mouth daily. Patient not taking: Reported on 04/25/2022 11/04/21   Jonetta Osgood, MD  folic acid (FOLVITE) 1 MG tablet Take 1 tablet (1 mg total) by mouth daily. Patient not taking: Reported on 04/25/2022 11/04/21   Jonetta Osgood, MD  magnesium oxide (MAG-OX) 400 MG tablet Take 2 tablets (800 mg total) by mouth 2 (two) times daily. Patient not taking: Reported on 04/25/2022 11/04/21   Jonetta Osgood, MD  Multiple Vitamins-Minerals (CERTAVITE/ANTIOXIDANTS) TABS Take 1 tablet by mouth daily before breakfast. Patient not taking: Reported on 04/25/2022 11/04/21   Jonetta Osgood, MD  Nystatin (GERHARDT'S BUTT CREAM)  CREA Apply 1 application topically 3 (three) times daily. Patient not taking: Reported on 04/25/2022 11/04/21   Jonetta Osgood, MD  Zinc Oxide (TRIPLE PASTE) 12.8 % ointment Apply 1 application topically 2 (two) times daily as needed. Patient not taking: Reported on 04/25/2022 11/04/21   Jonetta Osgood, MD    Allergies: No Known Allergies  Past Medical History: Past Medical History:  Diagnosis Date   Acute liver failure with hepatic coma ([place]) 06/04/2021   ETOH abuse     Past Surgical History:  Procedure Laterality Date   BIOPSY  06/08/2021   Procedure: BIOPSY;  Surgeon: Sharyn Creamer, MD;  Location: [facility] ENDOSCOPY;  Service: Endoscopy;;   BIOPSY  06/10/2021   Procedure: BIOPSY;  Surgeon: Jackquline Denmark, MD;  Location: [facility] ENDOSCOPY;  Service: Gastroenterology;;   COLONOSCOPY WITH PROPOFOL N/A 06/10/2021   Procedure: COLONOSCOPY WITH PROPOFOL;  Surgeon: Jackquline Denmark, MD;  Location: [place];  Service: Gastroenterology;  Laterality: N/A;   ESOPHAGOGASTRODUODENOSCOPY (EGD) WITH PROPOFOL N/A 06/08/2021   Procedure: ESOPHAGOGASTRODUODENOSCOPY (EGD) WITH PROPOFOL;  Surgeon: Sharyn Creamer, MD;  Location: [facility] ENDOSCOPY;  Service: Endoscopy;  Laterality: N/A;   NASAL SEPTUM SURGERY     WRIST SURGERY      Social History: Lives by himself.  Smokes half to 1 pack of cigarettes on a daily basis.  Drinks 12 pack of beer on a daily basis and might beer wine and liquor occasionally.  Family History:  Family History  Problem Relation Age of Onset   Hypertension Other      Review of Systems -unable to do  due to his altered mental status  Physical Examination  Vitals:   04/25/22 1615 04/25/22 1630 04/25/22 1700 04/25/22 1730  BP: 107/74 111/84 103/76 103/70  Pulse: (!) 102 99 97 92  Resp: 15 14 18 18   Temp:      TempSrc:      SpO2: 100% 100% 100% 99%  Weight:      Height:        BP 103/70   Pulse 92   Temp 98.4 F (36.9 C) (Rectal)   Resp 18   Ht 6' (1.829 m)   Wt 83.9 kg   SpO2  99%   BMI 25.09 kg/m   General appearance: alert, cooperative, distracted, fatigued, and no distress Head: Normocephalic, without obvious abnormality, atraumatic Eyes: conjunctivae/corneas clear. PERRL, EOM's intact.  Throat: lips, mucosa, and tongue normal; teeth and gums normal Neck: no adenopathy, no carotid bruit, no JVD, supple, symmetrical, trachea midline, and thyroid not enlarged, symmetric, no tenderness/mass/nodules Back: symmetric, no curvature. ROM normal. No CVA tenderness. Resp: Normal effort noted.  Diminished air entry at the bases.  Few scattered crackles.  No wheezing or rhonchi. Cardio: regular rate and rhythm, S1, S2 normal, no murmur, click, rub or gallop GI: soft, non-tender; bowel sounds normal; no masses,  no organomegaly Extremities: extremities normal, atraumatic, no cyanosis or edema Pulses: 2+ and symmetric Skin: Skin color, texture, turgor normal. No rashes or lesions Lymph nodes: Cervical, supraclavicular, and axillary nodes normal. Neurologic: Awake alert.  Was able to tell me he was at [place] the year.  Otherwise very distracted.  Cranial nerves II to XII intact.  Motor strength equal bilateral upper and lower extremities.  Was able to raise both legs off the bed.    Labs on Admission: I have personally reviewed following labs and imaging studies  CBC: Recent Labs  Lab 04/25/22 1456 04/25/22 1536  WBC 13.0*  --   NEUTROABS 10.9*  --   HGB 9.6* 10.2*  HCT 26.7* 30.0*  MCV 94.0  --   PLT 181  --    Basic Metabolic Panel: Recent Labs  Lab 04/25/22 1456 04/25/22 1536  NA 126* 124*  K 2.7* 2.8*  CL 79* 79*  CO2 29  --   GLUCOSE 121* 123*  BUN 15 13  CREATININE 0.66 0.50*  CALCIUM 9.3  --   MG 2.1  --    GFR: Estimated Creatinine Clearance: 114.5 mL/min (A) (by C-G formula based on SCr of 0.5 mg/dL (L)).  Liver Function Tests: Recent Labs  Lab 04/25/22 1456  AST 93*  ALT 67*  ALKPHOS 175*  BILITOT 4.0*  PROT  8.9*  ALBUMIN 3.1*   Recent Labs  Lab 04/25/22 1456  LIPASE 38    Coagulation Profile: Recent Labs  Lab 04/25/22 1456  INR 1.0      Radiological Exams on Admission: CT CHEST ABDOMEN PELVIS WO CONTRAST  Result Date: 04/25/2022 CLINICAL DATA:  Polytrauma, blunt. Pt was found by his neighbor laying in his floor covered in feces and empty beer cans beside him. Pt states he slipped and fell a couple a couple days ago and was unable to get off the floor" EXAM: CT CHEST, ABDOMEN AND PELVIS WITHOUT CONTRAST TECHNIQUE: Multidetector CT imaging of the chest, abdomen and pelvis was performed following the standard protocol without IV contrast. RADIATION DOSE REDUCTION: This exam was performed according to the departmental dose-optimization program which includes automated exposure control, adjustment of the mA and/or kV according to patient  size and/or use of iterative reconstruction technique. COMPARISON:  None Available. FINDINGS: CHEST: Cardiovascular: The thoracic aorta is normal in caliber. The heart is normal in size. No significant pericardial effusion. Mild atherosclerotic plaque. At least 2 vessel coronary calcification. Lungs/Pleura: Several vague scattered left upper lobe ground-glass airspace opacities. No pulmonary nodule. No pulmonary mass. No pulmonary contusion or laceration. No pneumatocele formation. No pleural effusion. No pneumothorax. No hemothorax. Mediastinum/Nodes: No pneumomediastinum. The central airways are patent. The esophagus is unremarkable. The thyroid is unremarkable. Limited evaluation for hilar lymphadenopathy on this noncontrast study. No mediastinal or axillary lymphadenopathy. Musculoskeletal/Chest wall No chest wall mass. No acute rib or sternal fracture. Likely old healed left clavicular fracture. Old healed left rib fractures. No spinal fracture. ABDOMEN / PELVIS: Hepatobiliary: Not enlarged. No focal lesion. The hepatic parenchyma is diffusely hypodense compared to  the splenic parenchyma consistent with fatty infiltration. Tiny gallstones within the gallbladder lumen. No gallbladder wall thickening or pericholecystic fluid. No biliary ductal dilatation. Pancreas: Normal pancreatic contour. No main pancreatic duct dilatation. Spleen: Not enlarged. No focal lesion. Adrenals/Urinary Tract: No nodularity bilaterally. No hydroureteronephrosis. No nephroureterolithiasis. No contour deforming renal mass. The urinary bladder is unremarkable. Stomach/Bowel: No small or large bowel wall thickening or dilatation. Fatty infiltration of the small and large bowel wall suggestive of chronic inflammatory changes. Colonic diverticulosis. The appendix is unremarkable. Vasculature/Lymphatic: No abdominal aorta or iliac aneurysm. No abdominal, pelvic, inguinal lymphadenopathy. Reproductive: Normal. Other: No simple free fluid ascites. No pneumoperitoneum. No mesenteric hematoma identified. No organized fluid collection. Musculoskeletal: No significant soft tissue hematoma. Tiny fat containing umbilical hernia. No acute pelvic fracture. No spinal fracture. Chronic L1 compression fracture. Mild retrolisthesis of L5 on S1. Intervertebral disc space vacuum phenomenon at the L5-S1 level. Ports and Devices: None. IMPRESSION: 1. Several vague scattered left upper lobe ground-glass airspace opacities that could represent developing infection/inflammation. 2. No acute traumatic injury to the chest, abdomen, or pelvis with limited evaluation on this noncontrast study. 3. No acute fracture or traumatic malalignment of the thoracic or lumbar spine in a patient with a chronic L1 compression fracture. 4. Other imaging findings of potential clinical significance: Hepatic steatosis. Cholelithiasis with no acute cholecystitis. Colonic diverticulosis with no acute diverticulitis. Left rib fractures. Electronically Signed   By: Tish Frederickson M.D.   On: 04/25/2022 17:09   CT Head Wo Contrast  Result Date:  04/25/2022 CLINICAL DATA:  Mental status change, unknown cause; Neck trauma, dangerous injury mechanism (Age 39-64y). Pt was found by his neighbor laying in his floor covered in feces and empty beer cans beside him. Pt states he slipped and fell a couple a couple days ago and was unable to get off the floor" EXAM: CT HEAD WITHOUT CONTRAST CT CERVICAL SPINE WITHOUT CONTRAST TECHNIQUE: Multidetector CT imaging of the head and cervical spine was performed following the standard protocol without intravenous contrast. Multiplanar CT image reconstructions of the cervical spine were also generated. RADIATION DOSE REDUCTION: This exam was performed according to the departmental dose-optimization program which includes automated exposure control, adjustment of the mA and/or kV according to patient size and/or use of iterative reconstruction technique. COMPARISON:  Ct head and cervical spine 09/20/2021 FINDINGS: CT HEAD FINDINGS BRAIN: BRAIN Stable prominence of the lateral ventricles may be related to central predominant atrophy, although a component of normal pressure/communicating hydrocephalus cannot be excluded. No evidence of large-territorial acute infarction. No parenchymal hemorrhage. No mass lesion. No extra-axial collection. No mass effect or midline shift. No hydrocephalus. Basilar cisterns are  patent. Vascular: No hyperdense vessel. Skull: No acute fracture or focal lesion. Sinuses/Orbits: Paranasal sinuses and mastoid air cells are clear. The orbits are unremarkable. Other: None. CT CERVICAL SPINE FINDINGS Alignment: Reversal of normal cervical lordosis likely due to positioning. Mild retrolisthesis C3 on C4 likely due to degenerative changes. Skull base and vertebrae: Multilevel mild-to-moderate degenerative changes of the spine. No acute fracture. No aggressive appearing focal osseous lesion or focal pathologic process. Soft tissues and spinal canal: No prevertebral fluid or swelling. No visible canal  hematoma. Upper chest: Trace biapical pleural/pulmonary scarring. Other: None. IMPRESSION: 1. No acute intracranial abnormality. 2. No acute displaced fracture or traumatic listhesis of the cervical spine. Electronically Signed   By: Tish Frederickson M.D.   On: 04/25/2022 17:00   CT Cervical Spine Wo Contrast  Result Date: 04/25/2022 CLINICAL DATA:  Mental status change, unknown cause; Neck trauma, dangerous injury mechanism (Age 64-64y). Pt was found by his neighbor laying in his floor covered in feces and empty beer cans beside him. Pt states he slipped and fell a couple a couple days ago and was unable to get off the floor" EXAM: CT HEAD WITHOUT CONTRAST CT CERVICAL SPINE WITHOUT CONTRAST TECHNIQUE: Multidetector CT imaging of the head and cervical spine was performed following the standard protocol without intravenous contrast. Multiplanar CT image reconstructions of the cervical spine were also generated. RADIATION DOSE REDUCTION: This exam was performed according to the departmental dose-optimization program which includes automated exposure control, adjustment of the mA and/or kV according to patient size and/or use of iterative reconstruction technique. COMPARISON:  Ct head and cervical spine 09/20/2021 FINDINGS: CT HEAD FINDINGS BRAIN: BRAIN Stable prominence of the lateral ventricles may be related to central predominant atrophy, although a component of normal pressure/communicating hydrocephalus cannot be excluded. No evidence of large-territorial acute infarction. No parenchymal hemorrhage. No mass lesion. No extra-axial collection. No mass effect or midline shift. No hydrocephalus. Basilar cisterns are patent. Vascular: No hyperdense vessel. Skull: No acute fracture or focal lesion. Sinuses/Orbits: Paranasal sinuses and mastoid air cells are clear. The orbits are unremarkable. Other: None. CT CERVICAL SPINE FINDINGS Alignment: Reversal of normal cervical lordosis likely due to positioning. Mild  retrolisthesis C3 on C4 likely due to degenerative changes. Skull base and vertebrae: Multilevel mild-to-moderate degenerative changes of the spine. No acute fracture. No aggressive appearing focal osseous lesion or focal pathologic process. Soft tissues and spinal canal: No prevertebral fluid or swelling. No visible canal hematoma. Upper chest: Trace biapical pleural/pulmonary scarring. Other: None. IMPRESSION: 1. No acute intracranial abnormality. 2. No acute displaced fracture or traumatic listhesis of the cervical spine. Electronically Signed   By: Tish Frederickson M.D.   On: 04/25/2022 17:00   DG Chest Port 1 View  Result Date: 04/25/2022 CLINICAL DATA:  Questionable sepsis - evaluate for abnormality EXAM: PORTABLE CHEST 1 VIEW COMPARISON:  Radiograph 10/22/2021, CT 10/22/2021 FINDINGS: Unchanged cardiomediastinal silhouette. There is no focal airspace consolidation. There is no pleural effusion. No pneumothorax. There is no acute osseous abnormality. Chronic clavicle and rib fracture deformities. IMPRESSION: No evidence of acute cardiopulmonary disease. Electronically Signed   By: Caprice Renshaw M.D.   On: 04/25/2022 16:31    My interpretation of Electrocardiogram: Sinus rhythm in the 90s.  Normal axis.  QT prolongation is noted.  Nonspecific ST segment changes.   Problem List  Principal Problem:   Hyponatremia Active Problems:   Alcohol use disorder with alcohol withdrawal    Normochromic normocytic anemia   Elevated LFTs  Prolonged QT interval   Acute metabolic encephalopathy   Hypokalemia   Aspiration pneumonia (HCC)   Assessment: This is a 56 year old Caucasian male with a past medical history of alcohol abuse who was found on the floor of his home by his neighbor.  Fall was reportedly 2 days prior to admission.  Patient has hyponatremia hypokalemia abnormal LFTs.  He is noted to be confused.  All of this is possibly secondary to hypovolemia and excessive alcohol abuse.  Plan:  #1.  Hyponatremia and hypokalemia: Magnesium level noted to be normal.  Potassium will be repleted intravenously for now.  He is high aspiration risk we will avoid oral supplementation.  Once he is more awake alert this can be attempted.  Recheck labs in the morning.  Check urine osmolality.  Avoid overcorrection of sodium levels.  #2.  Aspiration pneumonia: CT scan shows a left-sided groundglass opacities.  Chest x-ray was noted to be without any abnormalities.  Could have aspirated.  WBC is mildly elevated.  He has very poor oral dentition.  We will place him on Unasyn for now.  COVID-19 PCR is pending.  He is noted to be afebrile however.  Saturations are normal.  #3.  Abnormal LFTs: He is noted to have elevated AST ALT and hyperbilirubinemia.  All of this is likely secondary to his alcohol abuse.  CT scan showed cholelithiasis without evidence of cholecystitis.  Hepatic steatosis noted.  Abdomen is benign on examination.  Recheck labs tomorrow.  Hepatitis panel will be ordered.  #4. Acute metabolic encephalopathy: Likely secondary to metabolic derangements as well as alcoholism.  No focal neurological deficits are noted.  CT scan of the head did not show any acute findings.  Check ammonia level.  #5. Alcohol abuse with alcohol withdrawal: Last alcohol use is unknown.  He drinks at least 12 beers on a daily basis.  Possibly even drinks wine and liquor.  Place him on CIWA protocol with as needed medications only for now.  Thiamine multivitamins folic acid.  We will check thiamine level as well.  He may need high-dose thiamine.  Mildly elevated lactic acid probably due to hypovolemia.  #6. Left sided rib fracture: Could be a result of his recent fall.  Respiratory status is stable.  He is saturating normal on room air.  Incentive spirometry when he is able to cooperate.  #7. Normocytic anemia: Hemoglobin noted to be slightly lower than his baseline.  No overt bleeding identified.  Possibly from nutritional  deficiencies.  We will check anemia panel.  #8. High risk for rhabdomyolysis.  CK level is pending.  Continue IV fluids for now.  #9. Cholelithiasis: Noted incidentally on CT scan.  No cholecystitis.  Abdomen is benign on examination.  Continue to monitor.  #10. QT prolongation: Likely due to electrolyte imbalance.  Will need repeat EKG once electrolytes have been corrected.  DVT Prophylaxis: SCDs only for now Code Status: Full code Family Communication: No family at bedside Disposition: To be determined Consults called: None Admission Status: Inpatient    Severity of Illness: The appropriate patient status for this patient is INPATIENT. Inpatient status is judged to be reasonable and necessary in order to provide the required intensity of service to ensure the patient's safety. The patient's presenting symptoms, physical exam findings, and initial radiographic and laboratory data in the context of their chronic comorbidities is felt to place them at high risk for further clinical deterioration. Furthermore, it is not anticipated that the patient will be medically stable  for discharge from the hospital within 2 midnights of admission.   * I certify that at the point of admission it is my clinical judgment that the patient will require inpatient hospital care spanning beyond 2 midnights from the point of admission due to high intensity of service, high risk for further deterioration and high frequency of surveillance required.*   Further management decisions will depend on results of further testing and patient's response to treatment.   Krystl Wickware [employer]  [facility] operational officer on [email or web address]  04/25/2022, 5:48 PM

## 2022-04-26 DIAGNOSIS — E871 Hypo-osmolality and hyponatremia: Secondary | ICD-10-CM | POA: Diagnosis not present

## 2022-04-26 LAB — URINALYSIS, ROUTINE W REFLEX MICROSCOPIC
Bacteria, UA: NONE SEEN
Glucose, UA: NEGATIVE mg/dL
Hgb urine dipstick: NEGATIVE
Ketones, ur: 20 mg/dL — AB
Leukocytes,Ua: NEGATIVE
Nitrite: NEGATIVE
Protein, ur: 30 mg/dL — AB
Specific Gravity, Urine: 1.024 (ref 1.005–1.030)
pH: 5 (ref 5.0–8.0)

## 2022-04-26 LAB — COMPREHENSIVE METABOLIC PANEL
ALT: 50 U/L — ABNORMAL HIGH (ref 0–44)
AST: 69 U/L — ABNORMAL HIGH (ref 15–41)
Albumin: 2.4 g/dL — ABNORMAL LOW (ref 3.5–5.0)
Alkaline Phosphatase: 132 U/L — ABNORMAL HIGH (ref 38–126)
Anion gap: 10 (ref 5–15)
BUN: 12 mg/dL (ref 6–20)
CO2: 30 mmol/L (ref 22–32)
Calcium: 8.4 mg/dL — ABNORMAL LOW (ref 8.9–10.3)
Chloride: 90 mmol/L — ABNORMAL LOW (ref 98–111)
Creatinine, Ser: 0.56 mg/dL — ABNORMAL LOW (ref 0.61–1.24)
GFR, Estimated: >60 mL/min (ref >60)
Glucose, Bld: 88 mg/dL (ref 70–99)
Potassium: 3 mmol/L — ABNORMAL LOW (ref 3.5–5.1)
Sodium: 130 mmol/L — ABNORMAL LOW (ref 135–145)
Total Bilirubin: 2.7 mg/dL — ABNORMAL HIGH (ref 0.3–1.2)
Total Protein: 6.9 g/dL (ref 6.5–8.1)

## 2022-04-26 LAB — HEPATITIS PANEL, ACUTE
HCV Ab: NONREACTIVE
Hep A IgM: NONREACTIVE
Hep B C IgM: NONREACTIVE
Hepatitis B Surface Ag: NONREACTIVE

## 2022-04-26 LAB — CBC
HCT: 20.6 % — ABNORMAL LOW (ref 39.0–52.0)
Hemoglobin: 7.4 g/dL — ABNORMAL LOW (ref 13.0–17.0)
MCH: 34.1 pg — ABNORMAL HIGH (ref 26.0–34.0)
MCHC: 35.9 g/dL (ref 30.0–36.0)
MCV: 94.9 fL (ref 80.0–100.0)
Platelets: 137 10*3/uL — ABNORMAL LOW (ref 150–400)
RBC: 2.17 MIL/uL — ABNORMAL LOW (ref 4.22–5.81)
RDW: 14.5 % (ref 11.5–15.5)
WBC: 10.5 10*3/uL (ref 4.0–10.5)
nRBC: 0.3 % — ABNORMAL HIGH (ref 0.0–0.2)

## 2022-04-26 LAB — HEMOGLOBIN AND HEMATOCRIT, BLOOD
HCT: 26.6 % — ABNORMAL LOW (ref 39.0–52.0)
HCT: 22.7 % — ABNORMAL LOW (ref 39.0–52.0)
Hemoglobin: 9.1 g/dL — ABNORMAL LOW (ref 13.0–17.0)
Hemoglobin: 7.9 g/dL — ABNORMAL LOW (ref 13.0–17.0)

## 2022-04-26 LAB — BASIC METABOLIC PANEL
Anion gap: 12 (ref 5–15)
BUN: 10 mg/dL (ref 6–20)
CO2: 26 mmol/L (ref 22–32)
Calcium: 8.4 mg/dL — ABNORMAL LOW (ref 8.9–10.3)
Chloride: 92 mmol/L — ABNORMAL LOW (ref 98–111)
Creatinine, Ser: 0.38 mg/dL — ABNORMAL LOW (ref 0.61–1.24)
GFR, Estimated: >60 mL/min (ref >60)
Glucose, Bld: 72 mg/dL (ref 70–99)
Potassium: 3.9 mmol/L (ref 3.5–5.1)
Sodium: 130 mmol/L — ABNORMAL LOW (ref 135–145)

## 2022-04-26 LAB — RETICULOCYTES
Immature Retic Fract: 43.3 % — ABNORMAL HIGH (ref 2.3–15.9)
RBC.: 2.2 MIL/uL — ABNORMAL LOW (ref 4.22–5.81)
Retic Count, Absolute: 20.7 10*3/uL (ref 19.0–186.0)
Retic Ct Pct: 0.9 % (ref 0.4–3.1)

## 2022-04-26 LAB — FOLATE: Folate: 5.6 ng/mL — ABNORMAL LOW (ref >5.9)

## 2022-04-26 LAB — C DIFFICILE QUICK SCREEN W PCR REFLEX
C Diff antigen: NEGATIVE
C Diff interpretation: NOT DETECTED
C Diff toxin: NEGATIVE

## 2022-04-26 LAB — IRON AND TIBC
Iron: 53 ug/dL (ref 45–182)
Saturation Ratios: 27 % (ref 17.9–39.5)
TIBC: 200 ug/dL — ABNORMAL LOW (ref 250–450)
UIBC: 147 ug/dL

## 2022-04-26 LAB — LACTIC ACID, PLASMA: Lactic Acid, Venous: 1 mmol/L (ref 0.5–1.9)

## 2022-04-26 LAB — OCCULT BLOOD X 1 CARD TO LAB, STOOL: Fecal Occult Bld: NEGATIVE

## 2022-04-26 LAB — CBG MONITORING, ED: Glucose-Capillary: 76 mg/dL (ref 70–99)

## 2022-04-26 LAB — OSMOLALITY, URINE: Osmolality, Ur: 687 mOsm/kg (ref 300–900)

## 2022-04-26 LAB — MAGNESIUM: Magnesium: 2 mg/dL (ref 1.7–2.4)

## 2022-04-26 LAB — SODIUM, URINE, RANDOM: Sodium, Ur: <10 mmol/L

## 2022-04-26 LAB — VITAMIN B12: Vitamin B-12: 780 pg/mL (ref 180–914)

## 2022-04-26 LAB — FERRITIN: Ferritin: 913 ng/mL — ABNORMAL HIGH (ref 24–336)

## 2022-04-26 LAB — AMMONIA: Ammonia: 19 umol/L (ref 9–35)

## 2022-04-26 LAB — POTASSIUM: Potassium: 3.4 mmol/L — ABNORMAL LOW (ref 3.5–5.1)

## 2022-04-26 MED ORDER — POTASSIUM CHLORIDE 2 MEQ/ML IV SOLN: INTRAVENOUS | Status: DC

## 2022-04-26 MED ORDER — THIAMINE HCL 100 MG/ML IJ SOLN
400.0000 mg | Freq: Three times a day (TID) | INTRAVENOUS | Status: AC
  Administered 2022-04-26 – 2022-04-28 (×9): 400 mg via INTRAVENOUS
  Filled 2022-04-26 (×12): qty 4

## 2022-04-26 MED ORDER — POTASSIUM CHLORIDE CRYS ER 20 MEQ PO TBCR
40.0000 meq | EXTENDED_RELEASE_TABLET | Freq: Every day | ORAL | Status: AC
  Administered 2022-04-26: 40 meq via ORAL
  Filled 2022-04-26: qty 2

## 2022-04-26 MED ORDER — ZINC OXIDE 40 % EX OINT
TOPICAL_OINTMENT | Freq: Three times a day (TID) | CUTANEOUS | Status: DC | PRN
  Filled 2022-04-26: qty 57

## 2022-04-26 MED ORDER — POTASSIUM CHLORIDE CRYS ER 20 MEQ PO TBCR
40.0000 meq | EXTENDED_RELEASE_TABLET | Freq: Once | ORAL | Status: AC
  Administered 2022-04-26: 40 meq via ORAL
  Filled 2022-04-26: qty 2

## 2022-04-26 MED ORDER — KCL IN DEXTROSE-NACL 20-5-0.9 MEQ/L-%-% IV SOLN
INTRAVENOUS | Status: DC
  Filled 2022-04-26 (×3): qty 1000

## 2022-04-26 MED ORDER — SODIUM CHLORIDE 0.9 % IV BOLUS
500.0000 mL | Freq: Once | INTRAVENOUS | Status: AC
  Administered 2022-04-26: 500 mL via INTRAVENOUS

## 2022-04-26 MED ORDER — ENSURE ENLIVE PO LIQD: 237.0000 mL | Freq: Two times a day (BID) | ORAL | Status: DC

## 2022-04-26 MED ORDER — ORAL CARE MOUTH RINSE: 15.0000 mL | OROMUCOSAL | Status: DC | PRN

## 2022-04-26 MED ORDER — POTASSIUM CHLORIDE 10 MEQ/100ML IV SOLN
10.0000 meq | INTRAVENOUS | Status: AC
  Administered 2022-04-26 (×2): 10 meq via INTRAVENOUS
  Filled 2022-04-26 (×2): qty 100

## 2022-04-26 MED ORDER — SODIUM CHLORIDE 0.9 % IV BOLUS
250.0000 mL | Freq: Once | INTRAVENOUS | Status: AC
  Administered 2022-04-26: 250 mL via INTRAVENOUS

## 2022-04-26 MED ORDER — POTASSIUM CHLORIDE CRYS ER 20 MEQ PO TBCR
20.0000 meq | EXTENDED_RELEASE_TABLET | Freq: Two times a day (BID) | ORAL | Status: DC
Start: 2022-04-26 — End: 2022-04-26

## 2022-04-26 MED ORDER — SODIUM CHLORIDE 0.9 % IV SOLN
1.0000 mg | Freq: Every day | INTRAVENOUS | Status: DC
  Administered 2022-04-26 – 2022-04-28 (×3): 1 mg via INTRAVENOUS
  Filled 2022-04-26 (×4): qty 0.2

## 2022-04-26 MED ORDER — THIAMINE HCL 100 MG/ML IJ SOLN
100.0000 mg | Freq: Three times a day (TID) | INTRAMUSCULAR | Status: DC
  Administered 2022-04-29: 100 mg via INTRAVENOUS
  Filled 2022-04-26: qty 2

## 2022-04-26 NOTE — Hospital Course (Addendum)
55 y.o.m w/ history of alcohol abuse, previous history of alcoholic hepatitis brought into the ED by EMS after neighbor found the patient lying on the floor covered in feces and surrounded by empty beer cans.  Patient apparently reported to EMS that he fell 2 days prior to presentation. He was confused and history is very limited.  Was complaining of headache.  In ED-initially blood pressure was soft, labs showed-hyponatremia hypokalemia elevated LFTs and anemia,Imaging studies-CT head chest x-ray CT cervical spine, CT chest abdomen pelvis did not show any obvious injuries except likely healed old clavicular fracture, healed old left rib fracture,chronic L1 compression fracture hepatic steatosis, cholelithiasis, scattered left upper lobe groundglass airspace opacities.  Patient has treated with Unasyn.  He had diarrhea and GI panel came back positive for enteropathogenic E. coli and resolved without treatment he had hypovolemic hyponatremia dehydration that resolved with IV fluids.  His encephalopathy has significantly improved.  He remains weak and deconditioned PT OT suggesting skilled nursing facility placement

## 2022-04-26 NOTE — Plan of Care (Signed)

## 2022-04-26 NOTE — ED Notes (Signed)
Patient denies pain and is resting comfortably.  

## 2022-04-26 NOTE — ED Notes (Signed)
Fall risk precautions implemented. Yellow grippy socks, fall risk wristband applied, bed alarm activated.  

## 2022-04-26 NOTE — ED Notes (Signed)
Pt resting, respirations equal & unlabored. 

## 2022-04-26 NOTE — Progress Notes (Signed)
PROGRESS NOTE Dylan Weaver  WJX:[id number] DOB: [birth year] DOA: 04/25/2022 [facility]   Brief Narrative/Hospital Course: 56 y.o.m w/ history of alcohol abuse, previous history of alcoholic hepatitis brought into the ED by EMS after neighbor found the patient lying on the floor covered in feces and surrounded by empty beer cans.  Patient apparently reported to EMS that he fell 2 days prior to presentation. He was confused and history is very limited.  Was complaining of headache.  In ED-initially blood pressure was soft, labs showed-hyponatremia hypokalemia elevated LFTs and anemia,Imaging studies-CT head chest x-ray CT cervical spine, CT chest abdomen pelvis did not show any obvious injuries except likely healed old clavicular fracture, healed old left rib fracture,chronic L1 compression fracture hepatic steatosis, cholelithiasis, scattered left upper lobe groundglass airspace opacities.    Subjective: Seen and examined this am Alert awake oriented to place, year, president. "I drink too much"Last drink couple of days ago he says. Denies any pain or any complaints. 7/26-sodium improved to 130 potassium 3.0, LFTs slightly better, CK 44 negative troponins x2, folic acid level 5.6 hemoglobin downtrending 7.4,wbc normalized. Lactic acid 2.2>2.0,   Assessment and Plan: Principal Problem:   Hyponatremia Active Problems:   Alcohol use disorder with alcohol withdrawal    Normochromic normocytic anemia   Elevated LFTs   Prolonged QT interval   Acute metabolic encephalopathy   Hypokalemia   Aspiration pneumonia (HCC)   Hypovolemic hyponatremia Dehydration/ketosis in urine: Presentation consistent with dehydration with alcohol abuse, sodium improving on ivf. Suspect dehydration related. U na was low, u osmol high- sodium up 16 on admit-this am 130.  Avoid rapid correctionin first 24 hrs.Keep on ivf. Able to take po pills- slp eval requested for diet Recent Labs  Lab  04/25/22 1456 04/25/22 1536 04/26/22 0100 04/26/22 1000  NA 126* 124* 130* 130*    Hypokalemia Hypomagnesemia: repleted oral and iv-Cont ivf w/ kcl.  Recheck potassium later today Recent Labs  Lab 04/25/22 1456 04/25/22 1536 04/26/22 0100 04/26/22 0436 04/26/22 1000  K 2.7* 2.8* 3.0*  --  3.9  CALCIUM 9.3  --  8.4*  --  8.4*  MG 2.1  --   --  2.0  --     Aspiration pneumonia with CT showing-left-sided groundglass opacities cxr neg.Likely aspirated.He has very poor oral dentition.  Cont  unasyn. COVID-19 PCR negative.  Transaminitis/abnormal lfts: likely etoh related.likely secondary to his alcohol abuse. CT scan showed cholelithiasis without evidence of cholecystitis.Hepatic steatosis noted.  Acute hepatitis panel negative.  Abdomen benign on exam.  Trend  Acute metabolic encephalopathy:  likely due to metabolic derangements/alcoholism/ aspiration.non focal on exam. CT head- non acute-ammonia level normal. Cont ptot slp, fall precautions.  Start high-dose thiamine,ciwa ativan, etoh cessation. Monitor closely.  Alcohol abuse with alcohol withdrawal: Last alcohol use unknown patient reports 2 days ago drinks at least 2 beers on daily basis, possibly liquid.  Continue high-dose thiamine, folic acid, CIWA scale Ativan.  Mildly elevated lactic acid likely from dehydration related decreased clearance due to his hepatic derangement steatosis.  Repeat lactic acid to ensure resolution  Old healed left rib fractures/old clavicle fracture/chronic L1 compression fracture : Respiratory status is stable, no chest pain.  Continue incentive spirometry, pain control.    Normocytic anemia Folic acid deficiency Hemoglobin down trended, Hemoccult is negative, H&H is up and 9 this morning on recheck, transfuse if less than 7 g.  Start folic acid supplement.  B12 is normal, iron normal 53 ferritin high 913.Last  colonoscopy couple of years ago he says and was " okay"   Cholelithiasis: Noted incidentally  on CT scan.No cholecystitis. Monitor.  QT prolongation: Likely from electrolyte imbalance repleting electrolytes aggressively.monitor.   DVT prophylaxis: SCDs Start: 04/25/22 1729 Code Status:   Code Status: Full Code Family Communication: plan of care discussed with patient at bedside. Patient status is: Inpatient because of ongoing management of acute metabolic encephalopathy Level of care: Progressive   Dispo: The patient is from: home            Anticipated disposition: TBD  Mobility Assessment (last 72 hours)     Mobility Assessment   No documentation.            Objective: Vitals last 24 hrs: Vitals:   04/26/22 0800 04/26/22 0834 04/26/22 1100 04/26/22 1200  BP: (!) 87/65  (!) 90/59 94/69  Pulse: 77  79 77  Resp: 15  15 (!) 21  Temp:  98.1 F (36.7 C)  98 F (36.7 C)  TempSrc:  Oral  Axillary  SpO2: 100%  99% 100%  Weight:      Height:       Weight change:   Physical Examination: General exam: alert awake,older than stated age, weak appearing. HEENT:Oral mucosa moist, Ear/Nose WNL grossly, dentition normal. Respiratory system: bilaterally diminished BS, no use of accessory muscle Cardiovascular system: S1 & S2 +, No JVD. Gastrointestinal system: Abdomen soft,NT,ND, BS+ Nervous System:Alert, awake, moving extremities and grossly nonfocal Extremities: LE edema neg,distal peripheral pulses palpable.  Skin: No rashes,no icterus. MSK: Normal muscle bulk,tone, power  Medications reviewed:  Scheduled Meds:  multivitamin with minerals  1 tablet Oral Daily   [START ON 04/29/2022] thiamine injection  100 mg Intravenous Q8H   Continuous Infusions:  ampicillin-sulbactam (UNASYN) IV 3 g (04/26/22 1151)   dextrose 5 % and 0.9 % NaCl with KCl 20 mEq/L     folic acid 1 mg in sodium chloride 0.9 % 50 mL IVPB 1 mg (04/26/22 1101)   thiamine injection Stopped (04/26/22 1007)   Diet Order             Diet NPO time specified  Diet effective now                   Intake/Output Summary (Last 24 hours) at 04/26/2022 1252 Last data filed at 04/26/2022 1007 Gross per 24 hour  Intake 2717.97 ml  Output 300 ml  Net 2417.97 ml   Net IO Since Admission: 2,417.97 mL [04/26/22 1252]  Wt Readings from Last 3 Encounters:  04/25/22 83.9 kg  10/24/21 79.4 kg  09/20/21 79.4 kg     Unresulted Labs (From admission, onward)     Start     Ordered   04/27/22 0500  CBC  Daily at 5am,   R      04/26/22 0732   04/27/22 0500  Basic metabolic panel  Daily at 5am,   R      04/26/22 0732   04/27/22 0000  Hemoglobin and hematocrit, blood  Once,   R        04/26/22 0732   04/26/22 1600  Potassium  Once-Timed,   TIMED        04/26/22 1119   04/26/22 1000  Hemoglobin and hematocrit, blood  Now then every 8 hours,   R      04/26/22 0732   04/26/22 0500  Vitamin B1  Tomorrow morning,   R        04/25/22 1732  Data Reviewed: I have personally reviewed following labs and imaging studies CBC: Recent Labs  Lab 04/25/22 1456 04/25/22 1536 04/26/22 0100 04/26/22 1000  WBC 13.0*  --  10.5  --   NEUTROABS 10.9*  --   --   --   HGB 9.6* 10.2* 7.4* 9.1*  HCT 26.7* 30.0* 20.6* 26.6*  MCV 94.0  --  94.9  --   PLT 181  --  137*  --    Basic Metabolic Panel: Recent Labs  Lab 04/25/22 1456 04/25/22 1536 04/26/22 0100 04/26/22 0436 04/26/22 1000  NA 126* 124* 130*  --  130*  K 2.7* 2.8* 3.0*  --  3.9  CL 79* 79* 90*  --  92*  CO2 29  --  30  --  26  GLUCOSE 121* 123* 88  --  72  BUN --  10  CREATININE 0.66 0.50* 0.56*  --  0.38*  CALCIUM 9.3  --  8.4*  --  8.4*  MG 2.1  --   --  2.0  --    GFR: Estimated Creatinine Clearance: 114.5 mL/min (A) (by C-G formula based on SCr of 0.38 mg/dL (L)). Liver Function Tests: Recent Labs  Lab 04/25/22 1456 04/26/22 0100  AST 93* 69*  ALT 67* 50*  ALKPHOS 175* 132*  BILITOT 4.0* 2.7*  PROT 8.9* 6.9  ALBUMIN 3.1* 2.4*   Recent Labs  Lab 04/25/22 1456  LIPASE 38   Recent Labs  Lab  04/25/22 1708 04/26/22 0436  AMMONIA 22 19   Coagulation Profile: Recent Labs  Lab 04/25/22 1456  INR 1.0   BNP (last 3 results) No results for input(s): "PROBNP" in the last 8760 hours. HbA1C: No results for input(s): "HGBA1C" in the last 72 hours. CBG: Recent Labs  Lab 04/26/22 0433  GLUCAP 76   Lipid Profile: No results for input(s): "CHOL", "HDL", "LDLCALC", "TRIG", "CHOLHDL", "LDLDIRECT" in the last 72 hours. Thyroid Function Tests: No results for input(s): "TSH", "T4TOTAL", "FREET4", "T3FREE", "THYROIDAB" in the last 72 hours. Sepsis Labs: Recent Labs  Lab 04/25/22 1456 04/25/22 1708  LATICACIDVEN 2.2* 2.0*    Recent Results (from the past 240 hour(s))  Blood Culture (routine x 2)     Status: None (Preliminary result)   Collection Time: 04/25/22  2:44 PM   Specimen: Site Not Specified; Blood  Result Value Ref Range Status   Specimen Description   Final    SITE NOT SPECIFIED Performed at [facility], [address] [place], [place] [address]    Special Requests   Final    BOTTLES DRAWN AEROBIC AND ANAEROBIC Blood Culture adequate volume Performed at [facility], [address] [place], [place] [address]    Culture   Final    NO GROWTH < 12 HOURS Performed at [facility], [address], [place], [place] [address]    Report Status PENDING  Incomplete  Blood Culture (routine x 2)     Status: None (Preliminary result)   Collection Time: 04/25/22  2:56 PM   Specimen: BLOOD LEFT HAND  Result Value Ref Range Status   Specimen Description   Final    BLOOD LEFT HAND Performed at [facility], [address], [place], [place] [address]    Special Requests   Final    BOTTLES DRAWN AEROBIC ONLY Blood Culture results may not be optimal due to an inadequate volume of blood received in culture bottles Performed  at [facility], [address], [place], [place] [address]     Culture   Final    NO GROWTH < 12 HOURS Performed at [facility], [address] [place], [place] [address]    Report Status PENDING  Incomplete  SARS Coronavirus 2 by RT PCR (hospital order, performed in [facility] hospital lab) *cepheid single result test* Anterior Nasal Swab     Status: None   Collection Time: 04/25/22  5:04 PM   Specimen: Anterior Nasal Swab  Result Value Ref Range Status   SARS Coronavirus 2 by RT PCR NEGATIVE NEGATIVE Final    Comment: (NOTE) SARS-CoV-2 target nucleic acids are NOT DETECTED.  The SARS-CoV-2 RNA is generally detectable in upper and lower respiratory specimens during the acute phase of infection. The lowest concentration of SARS-CoV-2 viral copies this assay can detect is 250 copies / mL. A negative result does not preclude SARS-CoV-2 infection and should not be used as the sole basis for treatment or other patient management decisions.  A negative result may occur with improper specimen collection / handling, submission of specimen other than nasopharyngeal swab, presence of viral mutation(s) within the areas targeted by this assay, and inadequate number of viral copies (<250 copies / mL). A negative result must be combined with clinical observations, patient history, and epidemiological information.  Fact Sheet for Patients:   RoadLapTop.co.za  Fact Sheet for Healthcare Providers: http://kim-miller.com/  This test is not yet approved or  cleared by the [place] FDA and has been authorized for detection and/or diagnosis of SARS-CoV-2 by FDA under an Emergency Use Authorization (EUA).  This EUA will remain in effect (meaning this test can be used) for the duration of the COVID-19 declaration under Section 564(b)(1) of the Act, 21 U.S.C. section 360bbb-3(b)(1), unless the authorization is terminated or revoked sooner.  Performed at [facility], [address], [place], [place] [address]     Antimicrobials: Anti-infectives (From admission, onward)    Start     Dose/Rate Route Frequency Ordered Stop   04/25/22 1800  Ampicillin-Sulbactam (UNASYN) 3 g in sodium chloride 0.9 % 100 mL IVPB        3 g 200 mL/hr over 30 Minutes Intravenous Every 6 hours 04/25/22 1748        Culture/Microbiology    Component Value Date/Time   SDES  04/25/2022 1456    BLOOD LEFT HAND Performed at [facility], [address] [place], [place] [address]    SPECREQUEST  04/25/2022 1456    BOTTLES DRAWN AEROBIC ONLY Blood Culture results may not be optimal due to an inadequate volume of blood received in culture bottles Performed at [facility], [address] [place], [place] [address]    CULT  04/25/2022 1456    NO GROWTH < 12 HOURS Performed at [facility], [address] [place], [place] [address]    REPTSTATUS PENDING 04/25/2022 1456  Other culture-see note  Radiology Studies: CT CHEST ABDOMEN PELVIS WO CONTRAST  Result Date: 04/25/2022 CLINICAL DATA:  Polytrauma, blunt. Pt was found by his neighbor laying in his floor covered in feces and empty beer cans beside him. Pt states he slipped and fell a couple a couple days ago and was unable to get off the floor" EXAM: CT CHEST, ABDOMEN AND PELVIS WITHOUT CONTRAST TECHNIQUE: Multidetector CT imaging of the chest, abdomen and pelvis was performed following the standard protocol without IV contrast.  RADIATION DOSE REDUCTION: This exam was performed according to the departmental dose-optimization program which includes automated exposure control, adjustment of the mA and/or kV according to patient size and/or use of iterative reconstruction technique. COMPARISON:  None Available. FINDINGS: CHEST: Cardiovascular: The thoracic aorta is normal in caliber. The heart is normal in size. No significant pericardial effusion. Mild atherosclerotic plaque. At least 2 vessel coronary  calcification. Lungs/Pleura: Several vague scattered left upper lobe ground-glass airspace opacities. No pulmonary nodule. No pulmonary mass. No pulmonary contusion or laceration. No pneumatocele formation. No pleural effusion. No pneumothorax. No hemothorax. Mediastinum/Nodes: No pneumomediastinum. The central airways are patent. The esophagus is unremarkable. The thyroid is unremarkable. Limited evaluation for hilar lymphadenopathy on this noncontrast study. No mediastinal or axillary lymphadenopathy. Musculoskeletal/Chest wall No chest wall mass. No acute rib or sternal fracture. Likely old healed left clavicular fracture. Old healed left rib fractures. No spinal fracture. ABDOMEN / PELVIS: Hepatobiliary: Not enlarged. No focal lesion. The hepatic parenchyma is diffusely hypodense compared to the splenic parenchyma consistent with fatty infiltration. Tiny gallstones within the gallbladder lumen. No gallbladder wall thickening or pericholecystic fluid. No biliary ductal dilatation. Pancreas: Normal pancreatic contour. No main pancreatic duct dilatation. Spleen: Not enlarged. No focal lesion. Adrenals/Urinary Tract: No nodularity bilaterally. No hydroureteronephrosis. No nephroureterolithiasis. No contour deforming renal mass. The urinary bladder is unremarkable. Stomach/Bowel: No small or large bowel wall thickening or dilatation. Fatty infiltration of the small and large bowel wall suggestive of chronic inflammatory changes. Colonic diverticulosis. The appendix is unremarkable. Vasculature/Lymphatic: No abdominal aorta or iliac aneurysm. No abdominal, pelvic, inguinal lymphadenopathy. Reproductive: Normal. Other: No simple free fluid ascites. No pneumoperitoneum. No mesenteric hematoma identified. No organized fluid collection. Musculoskeletal: No significant soft tissue hematoma. Tiny fat containing umbilical hernia. No acute pelvic fracture. No spinal fracture. Chronic L1 compression fracture. Mild  retrolisthesis of L5 on S1. Intervertebral disc space vacuum phenomenon at the L5-S1 level. Ports and Devices: None. IMPRESSION: 1. Several vague scattered left upper lobe ground-glass airspace opacities that could represent developing infection/inflammation. 2. No acute traumatic injury to the chest, abdomen, or pelvis with limited evaluation on this noncontrast study. 3. No acute fracture or traumatic malalignment of the thoracic or lumbar spine in a patient with a chronic L1 compression fracture. 4. Other imaging findings of potential clinical significance: Hepatic steatosis. Cholelithiasis with no acute cholecystitis. Colonic diverticulosis with no acute diverticulitis. Left rib fractures. Electronically Signed   By: Tish Frederickson M.D.   On: 04/25/2022 17:09   CT Head Wo Contrast  Result Date: 04/25/2022 CLINICAL DATA:  Mental status change, unknown cause; Neck trauma, dangerous injury mechanism (Age 18-64y). Pt was found by his neighbor laying in his floor covered in feces and empty beer cans beside him. Pt states he slipped and fell a couple a couple days ago and was unable to get off the floor" EXAM: CT HEAD WITHOUT CONTRAST CT CERVICAL SPINE WITHOUT CONTRAST TECHNIQUE: Multidetector CT imaging of the head and cervical spine was performed following the standard protocol without intravenous contrast. Multiplanar CT image reconstructions of the cervical spine were also generated. RADIATION DOSE REDUCTION: This exam was performed according to the departmental dose-optimization program which includes automated exposure control, adjustment of the mA and/or kV according to patient size and/or use of iterative reconstruction technique. COMPARISON:  Ct head and cervical spine 09/20/2021 FINDINGS: CT HEAD FINDINGS BRAIN: BRAIN Stable prominence of the lateral ventricles may be related to central predominant atrophy, although a component of normal pressure/communicating hydrocephalus cannot be  excluded. No  evidence of large-territorial acute infarction. No parenchymal hemorrhage. No mass lesion. No extra-axial collection. No mass effect or midline shift. No hydrocephalus. Basilar cisterns are patent. Vascular: No hyperdense vessel. Skull: No acute fracture or focal lesion. Sinuses/Orbits: Paranasal sinuses and mastoid air cells are clear. The orbits are unremarkable. Other: None. CT CERVICAL SPINE FINDINGS Alignment: Reversal of normal cervical lordosis likely due to positioning. Mild retrolisthesis C3 on C4 likely due to degenerative changes. Skull base and vertebrae: Multilevel mild-to-moderate degenerative changes of the spine. No acute fracture. No aggressive appearing focal osseous lesion or focal pathologic process. Soft tissues and spinal canal: No prevertebral fluid or swelling. No visible canal hematoma. Upper chest: Trace biapical pleural/pulmonary scarring. Other: None. IMPRESSION: 1. No acute intracranial abnormality. 2. No acute displaced fracture or traumatic listhesis of the cervical spine. Electronically Signed   By: Tish Frederickson M.D.   On: 04/25/2022 17:00   CT Cervical Spine Wo Contrast  Result Date: 04/25/2022 CLINICAL DATA:  Mental status change, unknown cause; Neck trauma, dangerous injury mechanism (Age 14-64y). Pt was found by his neighbor laying in his floor covered in feces and empty beer cans beside him. Pt states he slipped and fell a couple a couple days ago and was unable to get off the floor" EXAM: CT HEAD WITHOUT CONTRAST CT CERVICAL SPINE WITHOUT CONTRAST TECHNIQUE: Multidetector CT imaging of the head and cervical spine was performed following the standard protocol without intravenous contrast. Multiplanar CT image reconstructions of the cervical spine were also generated. RADIATION DOSE REDUCTION: This exam was performed according to the departmental dose-optimization program which includes automated exposure control, adjustment of the mA and/or kV according to patient size  and/or use of iterative reconstruction technique. COMPARISON:  Ct head and cervical spine 09/20/2021 FINDINGS: CT HEAD FINDINGS BRAIN: BRAIN Stable prominence of the lateral ventricles may be related to central predominant atrophy, although a component of normal pressure/communicating hydrocephalus cannot be excluded. No evidence of large-territorial acute infarction. No parenchymal hemorrhage. No mass lesion. No extra-axial collection. No mass effect or midline shift. No hydrocephalus. Basilar cisterns are patent. Vascular: No hyperdense vessel. Skull: No acute fracture or focal lesion. Sinuses/Orbits: Paranasal sinuses and mastoid air cells are clear. The orbits are unremarkable. Other: None. CT CERVICAL SPINE FINDINGS Alignment: Reversal of normal cervical lordosis likely due to positioning. Mild retrolisthesis C3 on C4 likely due to degenerative changes. Skull base and vertebrae: Multilevel mild-to-moderate degenerative changes of the spine. No acute fracture. No aggressive appearing focal osseous lesion or focal pathologic process. Soft tissues and spinal canal: No prevertebral fluid or swelling. No visible canal hematoma. Upper chest: Trace biapical pleural/pulmonary scarring. Other: None. IMPRESSION: 1. No acute intracranial abnormality. 2. No acute displaced fracture or traumatic listhesis of the cervical spine. Electronically Signed   By: Tish Frederickson M.D.   On: 04/25/2022 17:00   DG Chest Port 1 View  Result Date: 04/25/2022 CLINICAL DATA:  Questionable sepsis - evaluate for abnormality EXAM: PORTABLE CHEST 1 VIEW COMPARISON:  Radiograph 10/22/2021, CT 10/22/2021 FINDINGS: Unchanged cardiomediastinal silhouette. There is no focal airspace consolidation. There is no pleural effusion. No pneumothorax. There is no acute osseous abnormality. Chronic clavicle and rib fracture deformities. IMPRESSION: No evidence of acute cardiopulmonary disease. Electronically Signed   By: Caprice Renshaw M.D.   On:  04/25/2022 16:31     LOS: 1 day   Lanae Boast, MD [facility]  04/26/2022, 12:52 PM

## 2022-04-26 NOTE — Evaluation (Signed)
Clinical/Bedside Swallow Evaluation Patient Details  Name: Jerrik Housholder MRN: [id number] Date of Birth: 1966/03/05  Today's Date: 04/26/2022 Time: SLP Start Time (ACUTE ONLY): 1530 SLP Stop Time (ACUTE ONLY): 1545 SLP Time Calculation (min) (ACUTE ONLY): 15 min  Past Medical History:  Past Medical History:  Diagnosis Date   Acute liver failure with hepatic coma (HCC) 06/04/2021   ETOH abuse    Past Surgical History:  Past Surgical History:  Procedure Laterality Date   BIOPSY  06/08/2021   Procedure: BIOPSY;  Surgeon: Imogene Burn, MD;  Location: [facility] ENDOSCOPY;  Service: Endoscopy;;   BIOPSY  06/10/2021   Procedure: BIOPSY;  Surgeon: Lynann Bologna, MD;  Location: [facility] ENDOSCOPY;  Service: Gastroenterology;;   COLONOSCOPY WITH PROPOFOL N/A 06/10/2021   Procedure: COLONOSCOPY WITH PROPOFOL;  Surgeon: Lynann Bologna, MD;  Location: [facility] ENDOSCOPY;  Service: Gastroenterology;  Laterality: N/A;   ESOPHAGOGASTRODUODENOSCOPY (EGD) WITH PROPOFOL N/A 06/08/2021   Procedure: ESOPHAGOGASTRODUODENOSCOPY (EGD) WITH PROPOFOL;  Surgeon: Imogene Burn, MD;  Location: [facility] ENDOSCOPY;  Service: Endoscopy;  Laterality: N/A;   NASAL SEPTUM SURGERY     WRIST SURGERY     HPI:  Patient is a 56 y.o. male with PMH: ETOH abuse, previous h/o alcoholic hepatitis who was brought to ED by EMS. Patient's neighbor found patient lying on the floor covered in feces and surrounded by empty beer cans. In ED patient had stable vital signs, bloodwork showed hyponatremia, hypokalemia, elevated LFT's and anemia. Imaging studies did not show any obvious injuries except rib fractures seen on CT scan. Patient made NPO awaiting SLP swallow evaluation.    Assessment / Plan / Recommendation  Clinical Impression  Patient not currently presenting with clinical s/s of dysphagia as per this clinical/bedside swallow evaluation. SLP spoke with patient's RN prior to entering room and she reported that she cleaned patient's mouth but he still  had some secretions. Patient was awake, alert and agreeable to evaluation. SLP provided oral care to remove some thick sticky secretions hanging down from hard palate onto tongue and after setup assist, patient able to brush his teeth. SLP then observed patient with straw sips of thin liquids (water), puree (applesauce) and regular solids (crackers). No overt s/s aspiration or penetration observed and no oral residuals post swallows. He did not have any difficulty feeding himself. SLP is recommending to initiate regular solids, thin liquids diet at this time and no further SLP interventions needed at this time. SLP Visit Diagnosis: Dysphagia, unspecified (R13.10)    Aspiration Risk  No limitations    Diet Recommendation Regular;Thin liquid   Liquid Administration via: Straw;Cup Medication Administration: Whole meds with liquid Supervision: Patient able to self feed Compensations: Minimize environmental distractions;Slow rate;Small sips/bites Postural Changes: Seated upright at 90 degrees    Other  Recommendations Oral Care Recommendations: Oral care BID    Recommendations for follow up therapy are one component of a multi-disciplinary discharge planning process, led by the attending physician.  Recommendations may be updated based on patient status, additional functional criteria and insurance authorization.  Follow up Recommendations No SLP follow up      Assistance Recommended at Discharge None  Functional Status Assessment Patient has had a recent decline in their functional status and demonstrates the ability to make significant improvements in function in a reasonable and predictable amount of time.  Frequency and Duration            Prognosis        Swallow Study   General Date  of Onset: 04/25/22 HPI: Patient is a 56 y.o. male with PMH: ETOH abuse, previous h/o alcoholic hepatitis who was brought to ED by EMS. Patient's neighbor found patient lying on the floor covered in  feces and surrounded by empty beer cans. In ED patient had stable vital signs, bloodwork showed hyponatremia, hypokalemia, elevated LFT's and anemia. Imaging studies did not show any obvious injuries except rib fractures seen on CT scan. Patient made NPO awaiting SLP swallow evaluation. Type of Study: Bedside Swallow Evaluation Previous Swallow Assessment: none found Diet Prior to this Study: NPO Temperature Spikes Noted: No Respiratory Status: Room air History of Recent Intubation: No Behavior/Cognition: Alert;Cooperative;Pleasant mood Oral Cavity Assessment: Excessive secretions Oral Care Completed by SLP: Yes Oral Cavity - Dentition: Adequate natural dentition Vision: Functional for self-feeding Self-Feeding Abilities: Able to feed self Patient Positioning: Upright in bed Baseline Vocal Quality: Normal Volitional Cough: Strong Volitional Swallow: Able to elicit    Oral/Motor/Sensory Function Overall Oral Motor/Sensory Function: Within functional limits   Ice Chips     Thin Liquid Thin Liquid: Within functional limits Presentation: Straw;Self Fed    Nectar Thick     Honey Thick     Puree Puree: Within functional limits Presentation: Self Fed   Solid     Solid: Within functional limits Presentation: Self Fed     Angela Nevin, MA, CCC-SLP Speech Therapy

## 2022-04-26 NOTE — ED Notes (Signed)
Pt provided perineal care/bath. New brief applied, repositioned to comfort.

## 2022-04-26 NOTE — Progress Notes (Signed)
PT Cancellation Note  Patient Details Name: Dylan Weaver MRN: [id number] DOB: 1966/02/21   Cancelled Treatment:    Reason Eval/Treat Not Completed: Other (comment) Pt found down at home and admitted with dehydration.  Pt's BP low at this time.  Will check back as schedule permits.   Janan Halter Payson 04/26/2022, 2:33 PM Thomasene Mohair PT, DPT Physical Therapist Acute Rehabilitation Services Preferred contact method: Secure Chat Weekend Pager Only: [phone] Office: [phone]

## 2022-04-27 DIAGNOSIS — A040 Enteropathogenic Escherichia coli infection: Secondary | ICD-10-CM

## 2022-04-27 DIAGNOSIS — E871 Hypo-osmolality and hyponatremia: Secondary | ICD-10-CM | POA: Diagnosis not present

## 2022-04-27 LAB — BASIC METABOLIC PANEL
Anion gap: 7 (ref 5–15)
BUN: 7 mg/dL (ref 6–20)
CO2: 26 mmol/L (ref 22–32)
Calcium: 8.2 mg/dL — ABNORMAL LOW (ref 8.9–10.3)
Chloride: 98 mmol/L (ref 98–111)
Creatinine, Ser: 0.52 mg/dL — ABNORMAL LOW (ref 0.61–1.24)
GFR, Estimated: >60 mL/min (ref >60)
Glucose, Bld: 99 mg/dL (ref 70–99)
Potassium: 3.6 mmol/L (ref 3.5–5.1)
Sodium: 131 mmol/L — ABNORMAL LOW (ref 135–145)

## 2022-04-27 LAB — GASTROINTESTINAL PANEL BY PCR, STOOL (REPLACES STOOL CULTURE)

## 2022-04-27 LAB — CBC
HCT: 21.6 % — ABNORMAL LOW (ref 39.0–52.0)
Hemoglobin: 7.3 g/dL — ABNORMAL LOW (ref 13.0–17.0)
MCH: 33.2 pg (ref 26.0–34.0)
MCHC: 33.8 g/dL (ref 30.0–36.0)
MCV: 98.2 fL (ref 80.0–100.0)
Platelets: 132 10*3/uL — ABNORMAL LOW (ref 150–400)
RBC: 2.2 MIL/uL — ABNORMAL LOW (ref 4.22–5.81)
RDW: 14.7 % (ref 11.5–15.5)
WBC: 9.8 10*3/uL (ref 4.0–10.5)
nRBC: 0.6 % — ABNORMAL HIGH (ref 0.0–0.2)

## 2022-04-27 MED ORDER — ASCORBIC ACID 500 MG PO TABS
500.0000 mg | ORAL_TABLET | Freq: Two times a day (BID) | ORAL | Status: DC
  Administered 2022-04-27 – 2022-05-11 (×28): 500 mg via ORAL
  Filled 2022-04-27 (×28): qty 1

## 2022-04-27 MED ORDER — ENSURE ENLIVE PO LIQD
237.0000 mL | Freq: Three times a day (TID) | ORAL | Status: DC
Start: 2022-04-27 — End: 2022-05-11
  Administered 2022-04-27 – 2022-05-09 (×19): 237 mL via ORAL

## 2022-04-27 NOTE — Evaluation (Signed)
Physical Therapy Evaluation Patient Details Name: Dylan Weaver MRN: [id number] DOB: 01-24-1966 Today's Date: 04/27/2022  History of Present Illness  56 y.o.m w/ history of alcohol abuse, previous history of alcoholic hepatitis brought into the ED by EMS after neighbor found the patient lying on the floor covered in feces and surrounded by empty beer cans.  Patient apparently reported to EMS that he fell 2 days prior to presentation. He was confused and history is very limited.  Was complaining of headache.   Pt admitted 04/25/22 for hyponatremia and Enteropathogenic E. coli diarrhea  Clinical Impression  Pt admitted with above diagnosis.  Pt currently with functional limitations due to the deficits listed below (see PT Problem List). Pt will benefit from skilled PT to increase their independence and safety with mobility to allow discharge to the venue listed below.  Pt requiring mod assist +2 for standing and transfers. Pt with weak LEs and observed buckling.  Pt would benefit from d/c to SNF.        Recommendations for follow up therapy are one component of a multi-disciplinary discharge planning process, led by the attending physician.  Recommendations may be updated based on patient status, additional functional criteria and insurance authorization.  Follow Up Recommendations Skilled nursing-short term rehab (<3 hours/day) Can patient physically be transported by private vehicle: No    Assistance Recommended at Discharge Intermittent Supervision/Assistance  Patient can return home with the following  A lot of help with walking and/or transfers    Equipment Recommendations None recommended by PT  Recommendations for Other Services       Functional Status Assessment Patient has had a recent decline in their functional status and demonstrates the ability to make significant improvements in function in a reasonable and predictable amount of time.     Precautions / Restrictions  Precautions Precautions: Fall      Mobility  Bed Mobility Overal bed mobility: Needs Assistance Bed Mobility: Rolling, Sidelying to Sit Rolling: Supervision Sidelying to sit: Supervision       General bed mobility comments: utilizes bed rail to self assist    Transfers Overall transfer level: Needs assistance Equipment used: Rolling walker (2 wheels) Transfers: Sit to/from Stand, Bed to chair/wheelchair/BSC Sit to Stand: Mod assist, Min assist, +2 physical assistance Stand pivot transfers: Mod assist, +2 physical assistance         General transfer comment: cues for hand placement,  Pt assisted with rise and steady, pt with buckling LEs and assisted to sitting; pt felt unable to ambulate but agreeable for OOB to recliner for lunch; requiring mod assist +2 for weakness and stability    Ambulation/Gait                  Stairs            Wheelchair Mobility    Modified Rankin (Stroke Patients Only)       Balance Overall balance assessment: History of Falls, Needs assistance         Standing balance support: Bilateral upper extremity supported, Reliant on assistive device for balance Standing balance-Leahy Scale: Zero Standing balance comment: requiring external assist at this time                             Pertinent Vitals/Pain Pain Assessment Pain Assessment: No/denies pain    Home Living Family/patient expects to be discharged to:: Private residence Living Arrangements: Alone   Type of Home: House  Home Access: Stairs to enter Entrance Stairs-Rails: Right;Left Entrance Stairs-Number of Steps: 3-4   Home Layout: One level Home Equipment: Agricultural consultant (2 wheels);Cane - single point      Prior Function Prior Level of Function : Independent/Modified Independent                     Hand Dominance        Extremity/Trunk Assessment        Lower Extremity Assessment Lower Extremity Assessment: Generalized  weakness    Cervical / Trunk Assessment Cervical / Trunk Assessment: Normal  Communication   Communication: No difficulties  Cognition Arousal/Alertness: Awake/alert Behavior During Therapy: Flat affect Overall Cognitive Status: Within Functional Limits for tasks assessed                                          General Comments      Exercises     Assessment/Plan    PT Assessment Patient needs continued PT services  PT Problem List Decreased mobility;Decreased balance;Decreased strength;Decreased activity tolerance;Decreased safety awareness;Decreased skin integrity       PT Treatment Interventions Gait training;DME instruction;Therapeutic exercise;Functional mobility training;Therapeutic activities;Patient/family education;Balance training    PT Goals (Current goals can be found in the Care Plan section)  Acute Rehab PT Goals PT Goal Formulation: With patient Time For Goal Achievement: 05/11/22 Potential to Achieve Goals: Good    Frequency Min 3X/week     Co-evaluation               AM-PAC PT "6 Clicks" Mobility  Outcome Measure Help needed turning from your back to your side while in a flat bed without using bedrails?: A Little Help needed moving from lying on your back to sitting on the side of a flat bed without using bedrails?: A Little Help needed moving to and from a bed to a chair (including a wheelchair)?: A Lot Help needed standing up from a chair using your arms (e.g., wheelchair or bedside chair)?: A Lot Help needed to walk in hospital room?: Total Help needed climbing 3-5 steps with a railing? : Total 6 Click Score: 12    End of Session Equipment Utilized During Treatment: Gait belt Activity Tolerance: Patient tolerated treatment well Patient left: in chair;with call bell/phone within reach;with chair alarm set NT aware pt left in recliner for lunch and will need assist back to bed (+2 for safety) after lunch (return to bed for  air mattress) Nurse Communication: Mobility status PT Visit Diagnosis: Difficulty in walking, not elsewhere classified (R26.2);Muscle weakness (generalized) (M62.81)    Time: 8270-7867 PT Time Calculation (min) (ACUTE ONLY): 15 min   Charges:   PT Evaluation $PT Eval Low Complexity: 1 Low         Kati PT, DPT Physical Therapist Acute Rehabilitation Services Preferred contact method: Secure Chat Weekend Pager Only: [phone] Office: [phone]   Janan Halter Payson 04/27/2022, 2:03 PM

## 2022-04-27 NOTE — Progress Notes (Signed)
Initial Nutrition Assessment  DOCUMENTATION CODES:   Not applicable  INTERVENTION:   Ensure Enlive po TID, each supplement provides 350 kcal and 20 grams of protein.  MVI, thiamine and folic acid daily   Vitamin C 500mg  po BID   Pt at high refeed risk; recommend monitor potassium, magnesium and phosphorus labs daily until stable  NUTRITION DIAGNOSIS:   Inadequate oral intake related to acute illness as evidenced by per patient/family report.  GOAL:   Patient will meet greater than or equal to 90% of their needs  MONITOR:   PO intake, Supplement acceptance, Labs, Weight trends, Skin, I & O's  REASON FOR ASSESSMENT:   Malnutrition Screening Tool    ASSESSMENT:   56 y/o male with h/o etoh abuse, cirrhosis, hepatitis and compression fracture who is admitted with E-coli diarrhea, rib fractures, encephalopathy and aspiration PNA.  RD working remotely.  Unable to reach pt by phone. Per chart review, pt reports poor oral intake for several days pta. RD suspects pt with decreased oral intake at baseline r/ etoh abuse. Per chart, pt appears weight stable at baseline. Pt is documented to be eating 75% of meals in hospital and is drinking Ensure supplements. RD will increase Ensure to three times daily and add vitamins to support wound healing. Pt noted to have folic acid deficiency and is receiving supplementation. Pt is at high refeed risk. RD will obtain nutrition related history and exam at follow up. Pt is at high risk for malnutrition.   Medications reviewed and include: thiamine, unasyn, folic acid  Labs reviewed: Na 131(L), K 3.6 wnl, creat 0.52(L) Mg 2.0 wnl- 7/26 Ammonia- 19- 7/26 Folate- 5.6(L)- 7/26 Hgb 7.3(L), Hct 21.6(L)  NUTRITION - FOCUSED PHYSICAL EXAM: Unable to perform at this time   Diet Order:   Diet Order             Diet regular Room service appropriate? Yes; Fluid consistency: Thin  Diet effective now                  EDUCATION NEEDS:   No  education needs have been identified at this time  Skin:  Skin Assessment: Reviewed RN Assessment (Left posterior shoulder 8X2cm, Middle back Stage 2 pressure injury 23X10X.2cm, Inner gluteal fold 2X.1X.1cm)  Last BM:  7/26- TYPE 6  Height:   Ht Readings from Last 1 Encounters:  04/25/22 6' (1.829 m)    Weight:   Wt Readings from Last 1 Encounters:  04/25/22 83.9 kg    Ideal Body Weight:  80.9 kg  BMI:  Body mass index is 25.09 kg/m.  Estimated Nutritional Needs:   Kcal:  2200-2500kcal/day  Protein:  110-125g/day  Fluid:  2.2-2.5L/day  04/27/22 MS, RD, LDN Please refer to [facility] for RD and/or RD on-call/weekend/after hours pager

## 2022-04-27 NOTE — Consult Note (Addendum)
WOC Nurse Consult Note: Reason for Consult: Consult requested for back.  Pt fell at home and was found down after an extended period of time and was covered in stool.  Wound type: Left posterior shoulder with dark red-purple deep tissue pressure injuries in a patchy area; approx 8X2cm Middle back with extensive area of Stage 2 pressure injury which is high risk to evolve into full thickness tissue loss; 23X10X.2cm, red and moist with large amt yellow drainage.  Inner gluteal fold with red moist partial thickness fissure; 2X.1X.1cm; appearance is consistent with moisture associated skin damage.  ICD-10 CM Codes for Irritant Dermatitis L24A2 - Due to fecal, urinary or dual incontinence  Pressure Injury POA: Yes Dressing procedure/placement/frequency: Pt already has an order for Desitin to the buttocks/inner gluteal fold. Topical treatment orders provided for bedside nurses to perform as follows to absorb drainage and provide antimicrobial benefits: 1. Apply foam dressing to upper middle back and change Q 3 days or PRN soiling. 2. Apply Aquacel [place] # [phone]) to lower middle back Q day, then cover with foam dressing. (Change foam dressing Q 3 days or PRN soiling.) Please re-consult if further assistance is needed.  Thank-you,  Cammie Mcgee MSN, RN, CWOCN, [place], CNS [phone]

## 2022-04-27 NOTE — TOC Initial Note (Signed)
Transition of Care [facility]) - Initial/Assessment Note    Patient Details  Name: Dylan Weaver MRN: [id number] Date of Birth: 1965/12/30  [facility] CM/SW Contact:    Golda Acre, RN Phone Number: 04/27/2022, 9:03 AM  Clinical Narrative:                 Hx of etoh abuse.  Found down in the home.  Originally was confused but now clear as to time,place and person.  Substance abuse resources given to patient.  Expected Discharge Plan: Home/Self Care Barriers to Discharge: Continued Medical Work up   Patient Goals and CMS Choice Patient states their goals for this hospitalization and ongoing recovery are:: to go home, i need help, i drink to much CMS Medicare.gov Compare Post Acute Care list provided to:: Patient Choice offered to / list presented to : Patient  Expected Discharge Plan and Services Expected Discharge Plan: Home/Self Care   Discharge Planning Services: CM Consult   Living arrangements for the past 2 months: Single Family Home                                      Prior Living Arrangements/Services Living arrangements for the past 2 months: Single Family Home Lives with:: Self Patient language and need for interpreter reviewed:: Yes Do you feel safe going back to the place where you live?: Yes            Criminal Activity/Legal Involvement Pertinent to Current Situation/Hospitalization: No - Comment as needed  Activities of Daily Living Home Assistive Devices/Equipment: Cane (specify quad or straight) ADL Screening (condition at time of admission) Patient's cognitive ability adequate to safely complete daily activities?: No Is the patient deaf or have difficulty hearing?: No Does the patient have difficulty seeing, even when wearing glasses/contacts?: No Does the patient have difficulty concentrating, remembering, or making decisions?: Yes Patient able to express need for assistance with ADLs?: No Does the patient have  difficulty dressing or bathing?: No Independently performs ADLs?: No Communication: Independent Is this a change from baseline?: Change from baseline, expected to last >3 days Dressing (OT): Needs assistance Is this a change from baseline?: Change from baseline, expected to last <3days Grooming: Needs assistance Is this a change from baseline?: Change from baseline, expected to last <3 days Feeding: Independent Is this a change from baseline?: Change from baseline, expected to last >3 days Bathing: Needs assistance Is this a change from baseline?: Change from baseline, expected to last <3 days Toileting: Needs assistance Is this a change from baseline?: Change from baseline, expected to last <3 days In/Out Bed: Needs assistance Is this a change from baseline?: Change from baseline, expected to last <3 days Walks in Home: Independent with device (comment) Is this a change from baseline?: Pre-admission baseline Does the patient have difficulty walking or climbing stairs?: Yes Weakness of Legs: Both Weakness of Arms/Hands: None  Permission Sought/Granted                  Emotional Assessment Appearance:: Appears stated age Attitude/Demeanor/Rapport: Engaged Affect (typically observed): Calm Orientation: : Oriented to Self, Oriented to Place, Oriented to  Time, Oriented to Situation Alcohol / Substance Use: Alcohol Use Psych Involvement: No (comment)  Admission diagnosis:  Hypokalemia [E87.6] Alcohol abuse [F10.10] Hyponatremia [E87.1] Patient Active Problem List   Diagnosis Date Noted   Hypokalemia 04/25/2022   Aspiration pneumonia (HCC) 04/25/2022  Debility 11/03/2021   Acute metabolic encephalopathy 11/02/2021   Enterococcal bacteremia    Pressure injury of skin 10/24/2021   Irritant contact dermatitis due to incontinence of both feces and urine 10/22/2021   Rhabdomyolysis 10/22/2021   Sepsis with acute organ dysfunction without septic shock due to bilateral  thigh/pubic area cellulitis and enterococcal bacteremia 10/22/2021   Hypomagnesemia 08/31/2021   Hepatic steatosis 08/31/2021   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)    Tobacco abuse 08/26/2021   Infection of right foot with necrosis 08/26/2021   Foot infection 08/26/2021   Hyponatremia    Decompensated hepatic cirrhosis (HCC) 08/25/2021   Protein-calorie malnutrition, severe 06/14/2021   Elevated LFTs 11/29/2020   Intertrigo 11/29/2020   Prolonged QT interval 11/29/2020   Malnutrition of moderate degree 09/16/2020   Alcohol withdrawal (HCC) 09/14/2020   Alcoholic hepatitis 09/14/2020   Thrombocytopenia (HCC) 09/14/2020   Normochromic normocytic anemia 09/14/2020   Alcohol use disorder with alcohol withdrawal  06/19/2020   Alcohol abuse with intoxication (HCC) 06/19/2020   PCP:  [facility], [name] Pharmacy:   [id number] - [place], [place] - [address] [place] [address] Phone: [phone] Fax: [phone]  [facility] [address] [place] [address] Phone: [phone] Fax: [phone]  [facility] [address] [place] [address] Phone: [phone] Fax: [phone]     Social Determinants of Health (SDOH) Interventions    Readmission Risk Interventions     No data to display

## 2022-04-27 NOTE — Progress Notes (Signed)
PROGRESS NOTE Dylan Weaver  ZOX:[id number] DOB: 09-24-1966 DOA: 04/25/2022 [facility]   Brief Narrative/Hospital Course: 56 y.o.m w/ history of alcohol abuse, previous history of alcoholic hepatitis brought into the ED by EMS after neighbor found the patient lying on the floor covered in feces and surrounded by empty beer cans.  Patient apparently reported to EMS that he fell 2 days prior to presentation. He was confused and history is very limited.  Was complaining of headache.  In ED-initially blood pressure was soft, labs showed-hyponatremia hypokalemia elevated LFTs and anemia,Imaging studies-CT head chest x-ray CT cervical spine, CT chest abdomen pelvis did not show any obvious injuries except likely healed old clavicular fracture, healed old left rib fracture,chronic L1 compression fracture hepatic steatosis, cholelithiasis, scattered left upper lobe groundglass airspace opacities.    Subjective:  Seen and examined Overnight no fever BP has been soft 88-96 systolic,  Hemoglobin downtrending 7.3 g, hypokalemia resolved Patient had profuse diarrhea yesterday- but better today   Assessment and Plan: Principal Problem:   Hyponatremia Active Problems:   Alcohol use disorder with alcohol withdrawal    Normochromic normocytic anemia   Elevated LFTs   Prolonged QT interval   Acute metabolic encephalopathy   Hypokalemia   Aspiration pneumonia (HCC)   Enteropathogenic E. coli diarrhea: Continue enteric precaution, supportive care.  Diarrhea improving wean off IV fluids encourage p.o. intake.  Hypovolemic hyponatremia Dehydration/ketosis in urine: Presentation consistent with dehydration with diarrhea, poor intake, alcohol abuse.  Sodium holding at 131, now tolerating diet DC IV fluids-SLP eval appreciated.  If BP remains soft we will resume IV fluids Recent Labs  Lab 04/25/22 1456 04/25/22 1536 04/26/22 0100 04/26/22 1000 04/27/22 0103  NA 126* 124* 130*  130* 131*     Hypokalemia-likely from diarrhea and decreased oral intake.  Resolved continue iv fluids Hypomagnesemia: repleted Recent Labs  Lab 04/25/22 1456 04/25/22 1536 04/26/22 0100 04/26/22 0436 04/26/22 1000 04/26/22 1530 04/27/22 0103  K 2.7* 2.8* 3.0*  --  3.9 3.4* 3.6  CALCIUM 9.3  --  8.4*  --  8.4*  --  8.2*  MG 2.1  --   --  2.0  --   --   --    Aspiration pneumonia with CT showing-left-sided groundglass opacities cxr neg.Likely aspirated.He has very poor oral dentition.  Remains afebrile, cont  unasyn. COVID-19 PCR negative.  Transaminitis/abnormal lfts: likely etoh related.likely secondary to his alcohol abuse. CT scan showed cholelithiasis without evidence of cholecystitis.Hepatic steatosis noted.  Acute hepatitis panel negative.  AST/ALT 69/50 T. bili down to 2.7 from 4.  Trend  Acute metabolic encephalopathy:  likely due to metabolic derangements/alcoholism/ aspiration.non focal on exam. CT head- non acute-ammonia level normal.  Due to his alcohol abuse history started on high-dose thiamine,ciwa ativan, etoh cessation counseling. PTOT eval.  Alcohol abuse with alcohol withdrawal: Last alcohol use unknown patient reports 2 days PTA, drinks at least 12 beers on daily basis, possibly liquid.  Continue high-dose thiamine, folic acid, CIWA scale Ativan prn. TOC consult for cessation.  Mildly elevated lactic acid likely from dehydration related decreased clearance due to his hepatic derangement steatosis.  Resolved  Old healed left rib fractures/old clavicle fracture/chronic L1 compression fracture : Respiratory status is stable, no chest pain.  Continue incentive spirometry, pain control.    Normocytic anemia Folic acid deficiency Hemoglobin down trending-Hemoccult is negative, no evidence of acute blood loss.- trend and transfuse if less than 7 g.  Started folic acid supplement.  B12 is  normal, iron normal 53 ferritin high 913.Last colonoscopy couple of years ago he says  and was " okay" Recent Labs  Lab 04/25/22 1536 04/26/22 0100 04/26/22 1000 04/26/22 1741 04/27/22 0103  HGB 10.2* 7.4* 9.1* 7.9* 7.3*  HCT 30.0* 20.6* 26.6* 22.7* 21.6*      Cholelithiasis: Noted incidentally on CT scan.No cholecystitis. Monitor.  QT prolongation: Likely from electrolyte imbalance repleting electrolytes aggressively.monitor in tele. Ekg in am again.   Deconditioning/debility obtain PT OT eval, encourage ambulation  DVT prophylaxis: SCDs Start: 04/25/22 1729.  No chemical prophylaxis due to anemia/ and drop in hemoglobin  Code Status:   Code Status: Full Code Family Communication: plan of care discussed with patient at bedside.  Patient status is: Inpatient because of ongoing management of anemia, deconditioning and debility pending PT OT evaluation   Level of care: Progressive  Dispo: The patient is from: home            Anticipated disposition: Pending PT OT for disposition   Mobility Assessment (last 72 hours)     Mobility Assessment     Row Name 04/26/22 2000 04/26/22 1509         Does patient have an order for bedrest or is patient medically unstable No - Continue assessment No - Continue assessment      What is the highest level of mobility based on the progressive mobility assessment? Level 3 (Stands with assist) - Balance while standing  and cannot march in place Level 3 (Stands with assist) - Balance while standing  and cannot march in place      Is the above level different from baseline mobility prior to current illness? -- Yes - Recommend PT order                Objective: Vitals last 24 hrs: Vitals:   04/26/22 2000 04/26/22 2207 04/26/22 2212 04/27/22 0224  BP:  (!) 88/57 (!) 88/57 (!) 96/56  Pulse:  81 81 80  Resp: 17 19 19 18   Temp:  98 F (36.7 C) 98 F (36.7 C) 97.9 F (36.6 C)  TempSrc:  Oral Oral Oral  SpO2:  98% 98% 99%  Weight:      Height:       Weight change:   Physical Examination: General exam: AA oriented,  pleasant, older than stated age, weak appearing. HEENT:Oral mucosa moist, Ear/Nose WNL grossly, dentition normal. Respiratory system: bilaterally diminished, no use of accessory muscle Cardiovascular system: S1 & S2 +, No JVD,. Gastrointestinal system: Abdomen soft,NT,ND,BS+ Nervous System:Alert, awake, moving extremities and grossly nonfocal Extremities: LE ankle edema neg, distal peripheral pulses palpable.  Skin: No rashes,no icterus. MSK: Normal muscle bulk,tone, power   Medications reviewed:  Scheduled Meds:  feeding supplement  237 mL Oral BID BM   multivitamin with minerals  1 tablet Oral Daily   [START ON 04/29/2022] thiamine (VITAMIN B1) injection  100 mg Intravenous Q8H   Continuous Infusions:  ampicillin-sulbactam (UNASYN) IV Stopped (04/27/22 0533)   dextrose 5 % and 0.9 % NaCl with KCl 20 mEq/L 100 mL/hr at 04/27/22 0600   folic acid 1 mg in sodium chloride 0.9 % 50 mL IVPB 1 mg (04/26/22 1101)   thiamine (VITAMIN B1) injection 100 mL/hr at 04/27/22 0600   Diet Order             Diet regular Room service appropriate? Yes; Fluid consistency: Thin  Diet effective now  Intake/Output Summary (Last 24 hours) at 04/27/2022 0820 Last data filed at 04/27/2022 0818 Gross per 24 hour  Intake 2379.28 ml  Output 800 ml  Net 1579.28 ml    Net IO Since Admission: 3,847.25 mL [04/27/22 0820]  Wt Readings from Last 3 Encounters:  04/25/22 83.9 kg  10/24/21 79.4 kg  09/20/21 79.4 kg     Unresulted Labs (From admission, onward)     Start     Ordered   04/27/22 0500  CBC  Daily at 5am,   R      04/26/22 0732   04/27/22 0500  Basic metabolic panel  Daily at 5am,   R      04/26/22 0732   04/26/22 1621  Gastrointestinal Panel by PCR , Stool  (Gastrointestinal Panel by PCR, Stool                                                                                                                                                     **Does Not include CLOSTRIDIUM  DIFFICILE testing. **If CDIFF testing is needed, place order from the "C Difficile Testing" order set.**)  Once,   R        04/26/22 1621   04/26/22 0500  Vitamin B1  Tomorrow morning,   R        04/25/22 1732          Data Reviewed: I have personally reviewed following labs and imaging studies CBC: Recent Labs  Lab 04/25/22 1456 04/25/22 1536 04/26/22 0100 04/26/22 1000 04/26/22 1741 04/27/22 0103  WBC 13.0*  --  10.5  --   --  9.8  NEUTROABS 10.9*  --   --   --   --   --   HGB 9.6* 10.2* 7.4* 9.1* 7.9* 7.3*  HCT 26.7* 30.0* 20.6* 26.6* 22.7* 21.6*  MCV 94.0  --  94.9  --   --  98.2  PLT 181  --  137*  --   --  132*    Basic Metabolic Panel: Recent Labs  Lab 04/25/22 1456 04/25/22 1536 04/26/22 0100 04/26/22 0436 04/26/22 1000 04/26/22 1530 04/27/22 0103  NA 126* 124* 130*  --  130*  --  131*  K 2.7* 2.8* 3.0*  --  3.9 3.4* 3.6  CL 79* 79* 90*  --  92*  --  98  CO2 29  --  30  --  26  --  26  GLUCOSE 121* 123* 88  --  72  --  99  BUN 15 13 12   --  10  --  7  CREATININE 0.66 0.50* 0.56*  --  0.38*  --  0.52*  CALCIUM 9.3  --  8.4*  --  8.4*  --  8.2*  MG 2.1  --   --  2.0  --   --   --  GFR: Estimated Creatinine Clearance: 114.5 mL/min (A) (by C-G formula based on SCr of 0.52 mg/dL (L)). Liver Function Tests: Recent Labs  Lab 04/25/22 1456 04/26/22 0100  AST 93* 69*  ALT 67* 50*  ALKPHOS 175* 132*  BILITOT 4.0* 2.7*  PROT 8.9* 6.9  ALBUMIN 3.1* 2.4*    Recent Labs  Lab 04/25/22 1456  LIPASE 38    Recent Labs  Lab 04/25/22 1708 04/26/22 0436  AMMONIA 22 19    Coagulation Profile: Recent Labs  Lab 04/25/22 1456  INR 1.0    BNP (last 3 results) No results for input(s): "PROBNP" in the last 8760 hours. HbA1C: No results for input(s): "HGBA1C" in the last 72 hours. CBG: Recent Labs  Lab 04/26/22 0433  GLUCAP 76    Lipid Profile: No results for input(s): "CHOL", "HDL", "LDLCALC", "TRIG", "CHOLHDL", "LDLDIRECT" in the last 72  hours. Thyroid Function Tests: No results for input(s): "TSH", "T4TOTAL", "FREET4", "T3FREE", "THYROIDAB" in the last 72 hours. Sepsis Labs: Recent Labs  Lab 04/25/22 1456 04/25/22 1708 04/26/22 1530  LATICACIDVEN 2.2* 2.0* 1.0     Recent Results (from the past 240 hour(s))  Blood Culture (routine x 2)     Status: None (Preliminary result)   Collection Time: 04/25/22  2:44 PM   Specimen: Site Not Specified; Blood  Result Value Ref Range Status   Specimen Description   Final    SITE NOT SPECIFIED Performed at [facility], [address] [place], [place] [address]    Special Requests   Final    BOTTLES DRAWN AEROBIC AND ANAEROBIC Blood Culture adequate volume Performed at [facility], [address] [place], [place] [address]    Culture   Final    NO GROWTH < 12 HOURS Performed at [facility], [address] [place], [place] [address]    Report Status PENDING  Incomplete  Blood Culture (routine x 2)     Status: None (Preliminary result)   Collection Time: 04/25/22  2:56 PM   Specimen: BLOOD LEFT HAND  Result Value Ref Range Status   Specimen Description   Final    BLOOD LEFT HAND Performed at [facility], [address], [place], [place] [address]    Special Requests   Final    BOTTLES DRAWN AEROBIC ONLY Blood Culture results may not be optimal due to an inadequate volume of blood received in culture bottles Performed at [facility], [address], [place], [place] [address]    Culture   Final    NO GROWTH < 12 HOURS Performed at [facility], [address], [place], [place] [address]    Report Status PENDING  Incomplete  SARS Coronavirus 2 by RT PCR (hospital order, performed in [facility] hospital lab) *cepheid single result test* Anterior Nasal Swab     Status: None   Collection Time: 04/25/22  5:04 PM   Specimen: Anterior Nasal Swab  Result Value Ref Range Status    SARS Coronavirus 2 by RT PCR NEGATIVE NEGATIVE Final    Comment: (NOTE) SARS-CoV-2 target nucleic acids are NOT DETECTED.  The SARS-CoV-2 RNA is generally detectable in upper and lower respiratory specimens during the acute phase of infection. The lowest concentration of SARS-CoV-2 viral copies this assay can detect is 250 copies / mL. A negative result does not preclude SARS-CoV-2 infection and should not be used as the sole basis for treatment or other patient management decisions.  A negative result may occur with improper specimen collection / handling, submission of specimen other than nasopharyngeal swab, presence of viral mutation(s) within the areas targeted by this assay, and inadequate number of viral copies (<250 copies / mL). A negative result must be combined with clinical observations, patient history, and epidemiological information.  Fact Sheet for Patients:   RoadLapTop.co.za  Fact Sheet for Healthcare Providers: http://kim-miller.com/  This test is not yet approved or  cleared by the [place] FDA and has been authorized for detection and/or diagnosis of SARS-CoV-2 by FDA under an Emergency Use Authorization (EUA).  This EUA will remain in effect (meaning this test can be used) for the duration of the COVID-19 declaration under Section 564(b)(1) of the Act, 21 U.S.C. section 360bbb-3(b)(1), unless the authorization is terminated or revoked sooner.  Performed at [facility], [address], [place], [place] [address]   C Difficile Quick Screen w PCR reflex     Status: None   Collection Time: 04/26/22  6:00 PM   Specimen: STOOL  Result Value Ref Range Status   C Diff antigen NEGATIVE NEGATIVE Final   C Diff toxin NEGATIVE NEGATIVE Final   C Diff interpretation No C. difficile detected.  Final    Comment: Performed at [facility], [address], [place], [place] [address]     Antimicrobials: Anti-infectives (From admission, onward)    Start     Dose/Rate Route Frequency Ordered Stop   04/25/22 1800  Ampicillin-Sulbactam (UNASYN) 3 g in sodium chloride 0.9 % 100 mL IVPB        3 g 200 mL/hr over 30 Minutes Intravenous Every 6 hours 04/25/22 1748        Culture/Microbiology    Component Value Date/Time   SDES  04/25/2022 1456    BLOOD LEFT HAND Performed at [facility], [address], [place], [place] [address]    SPECREQUEST  04/25/2022 1456    BOTTLES DRAWN AEROBIC ONLY Blood Culture results may not be optimal due to an inadequate volume of blood received in culture bottles Performed at [facility], [address], [place], [place] [address]    CULT  04/25/2022 1456    NO GROWTH < 12 HOURS Performed at [facility], [address], [place], [place] [address]    REPTSTATUS PENDING 04/25/2022 1456  Other culture-see note  Radiology Studies: CT CHEST ABDOMEN PELVIS WO CONTRAST  Result Date: 04/25/2022 CLINICAL DATA:  Polytrauma, blunt. Pt was found by his neighbor laying in his floor covered in feces and empty beer cans beside him. Pt states he slipped and fell a couple a couple days ago and was unable to get off the floor" EXAM: CT CHEST, ABDOMEN AND PELVIS WITHOUT CONTRAST TECHNIQUE: Multidetector CT imaging of the chest, abdomen and pelvis was performed following the standard protocol without IV contrast. RADIATION DOSE REDUCTION: This exam was performed according to the departmental dose-optimization program which includes automated exposure control, adjustment of the mA and/or kV according to patient size and/or use of iterative reconstruction technique. COMPARISON:  None Available. FINDINGS: CHEST: Cardiovascular: The thoracic aorta is normal in caliber. The heart is normal in size. No significant pericardial effusion. Mild atherosclerotic plaque. At least 2 vessel coronary calcification. Lungs/Pleura:  Several vague scattered left upper lobe ground-glass airspace opacities. No pulmonary nodule. No pulmonary mass. No pulmonary contusion or laceration. No pneumatocele formation. No pleural effusion. No pneumothorax. No hemothorax. Mediastinum/Nodes: No pneumomediastinum. The central  airways are patent. The esophagus is unremarkable. The thyroid is unremarkable. Limited evaluation for hilar lymphadenopathy on this noncontrast study. No mediastinal or axillary lymphadenopathy. Musculoskeletal/Chest wall No chest wall mass. No acute rib or sternal fracture. Likely old healed left clavicular fracture. Old healed left rib fractures. No spinal fracture. ABDOMEN / PELVIS: Hepatobiliary: Not enlarged. No focal lesion. The hepatic parenchyma is diffusely hypodense compared to the splenic parenchyma consistent with fatty infiltration. Tiny gallstones within the gallbladder lumen. No gallbladder wall thickening or pericholecystic fluid. No biliary ductal dilatation. Pancreas: Normal pancreatic contour. No main pancreatic duct dilatation. Spleen: Not enlarged. No focal lesion. Adrenals/Urinary Tract: No nodularity bilaterally. No hydroureteronephrosis. No nephroureterolithiasis. No contour deforming renal mass. The urinary bladder is unremarkable. Stomach/Bowel: No small or large bowel wall thickening or dilatation. Fatty infiltration of the small and large bowel wall suggestive of chronic inflammatory changes. Colonic diverticulosis. The appendix is unremarkable. Vasculature/Lymphatic: No abdominal aorta or iliac aneurysm. No abdominal, pelvic, inguinal lymphadenopathy. Reproductive: Normal. Other: No simple free fluid ascites. No pneumoperitoneum. No mesenteric hematoma identified. No organized fluid collection. Musculoskeletal: No significant soft tissue hematoma. Tiny fat containing umbilical hernia. No acute pelvic fracture. No spinal fracture. Chronic L1 compression fracture. Mild retrolisthesis of L5 on S1.  Intervertebral disc space vacuum phenomenon at the L5-S1 level. Ports and Devices: None. IMPRESSION: 1. Several vague scattered left upper lobe ground-glass airspace opacities that could represent developing infection/inflammation. 2. No acute traumatic injury to the chest, abdomen, or pelvis with limited evaluation on this noncontrast study. 3. No acute fracture or traumatic malalignment of the thoracic or lumbar spine in a patient with a chronic L1 compression fracture. 4. Other imaging findings of potential clinical significance: Hepatic steatosis. Cholelithiasis with no acute cholecystitis. Colonic diverticulosis with no acute diverticulitis. Left rib fractures. Electronically Signed   By: Tish Frederickson M.D.   On: 04/25/2022 17:09   CT Head Wo Contrast  Result Date: 04/25/2022 CLINICAL DATA:  Mental status change, unknown cause; Neck trauma, dangerous injury mechanism (Age 14-64y). Pt was found by his neighbor laying in his floor covered in feces and empty beer cans beside him. Pt states he slipped and fell a couple a couple days ago and was unable to get off the floor" EXAM: CT HEAD WITHOUT CONTRAST CT CERVICAL SPINE WITHOUT CONTRAST TECHNIQUE: Multidetector CT imaging of the head and cervical spine was performed following the standard protocol without intravenous contrast. Multiplanar CT image reconstructions of the cervical spine were also generated. RADIATION DOSE REDUCTION: This exam was performed according to the departmental dose-optimization program which includes automated exposure control, adjustment of the mA and/or kV according to patient size and/or use of iterative reconstruction technique. COMPARISON:  Ct head and cervical spine 09/20/2021 FINDINGS: CT HEAD FINDINGS BRAIN: BRAIN Stable prominence of the lateral ventricles may be related to central predominant atrophy, although a component of normal pressure/communicating hydrocephalus cannot be excluded. No evidence of large-territorial  acute infarction. No parenchymal hemorrhage. No mass lesion. No extra-axial collection. No mass effect or midline shift. No hydrocephalus. Basilar cisterns are patent. Vascular: No hyperdense vessel. Skull: No acute fracture or focal lesion. Sinuses/Orbits: Paranasal sinuses and mastoid air cells are clear. The orbits are unremarkable. Other: None. CT CERVICAL SPINE FINDINGS Alignment: Reversal of normal cervical lordosis likely due to positioning. Mild retrolisthesis C3 on C4 likely due to degenerative changes. Skull base and vertebrae: Multilevel mild-to-moderate degenerative changes of the spine. No acute fracture. No aggressive appearing focal osseous lesion or focal pathologic process. Soft  tissues and spinal canal: No prevertebral fluid or swelling. No visible canal hematoma. Upper chest: Trace biapical pleural/pulmonary scarring. Other: None. IMPRESSION: 1. No acute intracranial abnormality. 2. No acute displaced fracture or traumatic listhesis of the cervical spine. Electronically Signed   By: Tish Frederickson M.D.   On: 04/25/2022 17:00   CT Cervical Spine Wo Contrast  Result Date: 04/25/2022 CLINICAL DATA:  Mental status change, unknown cause; Neck trauma, dangerous injury mechanism (Age 40-64y). Pt was found by his neighbor laying in his floor covered in feces and empty beer cans beside him. Pt states he slipped and fell a couple a couple days ago and was unable to get off the floor" EXAM: CT HEAD WITHOUT CONTRAST CT CERVICAL SPINE WITHOUT CONTRAST TECHNIQUE: Multidetector CT imaging of the head and cervical spine was performed following the standard protocol without intravenous contrast. Multiplanar CT image reconstructions of the cervical spine were also generated. RADIATION DOSE REDUCTION: This exam was performed according to the departmental dose-optimization program which includes automated exposure control, adjustment of the mA and/or kV according to patient size and/or use of iterative  reconstruction technique. COMPARISON:  Ct head and cervical spine 09/20/2021 FINDINGS: CT HEAD FINDINGS BRAIN: BRAIN Stable prominence of the lateral ventricles may be related to central predominant atrophy, although a component of normal pressure/communicating hydrocephalus cannot be excluded. No evidence of large-territorial acute infarction. No parenchymal hemorrhage. No mass lesion. No extra-axial collection. No mass effect or midline shift. No hydrocephalus. Basilar cisterns are patent. Vascular: No hyperdense vessel. Skull: No acute fracture or focal lesion. Sinuses/Orbits: Paranasal sinuses and mastoid air cells are clear. The orbits are unremarkable. Other: None. CT CERVICAL SPINE FINDINGS Alignment: Reversal of normal cervical lordosis likely due to positioning. Mild retrolisthesis C3 on C4 likely due to degenerative changes. Skull base and vertebrae: Multilevel mild-to-moderate degenerative changes of the spine. No acute fracture. No aggressive appearing focal osseous lesion or focal pathologic process. Soft tissues and spinal canal: No prevertebral fluid or swelling. No visible canal hematoma. Upper chest: Trace biapical pleural/pulmonary scarring. Other: None. IMPRESSION: 1. No acute intracranial abnormality. 2. No acute displaced fracture or traumatic listhesis of the cervical spine. Electronically Signed   By: Tish Frederickson M.D.   On: 04/25/2022 17:00   DG Chest Port 1 View  Result Date: 04/25/2022 CLINICAL DATA:  Questionable sepsis - evaluate for abnormality EXAM: PORTABLE CHEST 1 VIEW COMPARISON:  Radiograph 10/22/2021, CT 10/22/2021 FINDINGS: Unchanged cardiomediastinal silhouette. There is no focal airspace consolidation. There is no pleural effusion. No pneumothorax. There is no acute osseous abnormality. Chronic clavicle and rib fracture deformities. IMPRESSION: No evidence of acute cardiopulmonary disease. Electronically Signed   By: Caprice Renshaw M.D.   On: 04/25/2022 16:31     LOS: 2  days   Lanae Boast, MD [facility]  04/27/2022, 8:20 AM

## 2022-04-28 DIAGNOSIS — E871 Hypo-osmolality and hyponatremia: Secondary | ICD-10-CM | POA: Diagnosis not present

## 2022-04-28 LAB — CBC
HCT: 22.3 % — ABNORMAL LOW (ref 39.0–52.0)
Hemoglobin: 7.5 g/dL — ABNORMAL LOW (ref 13.0–17.0)
MCH: 33.3 pg (ref 26.0–34.0)
MCHC: 33.6 g/dL (ref 30.0–36.0)
MCV: 99.1 fL (ref 80.0–100.0)
Platelets: 129 10*3/uL — ABNORMAL LOW (ref 150–400)
RBC: 2.25 MIL/uL — ABNORMAL LOW (ref 4.22–5.81)
RDW: 15 % (ref 11.5–15.5)
WBC: 8 10*3/uL (ref 4.0–10.5)
nRBC: 0.8 % — ABNORMAL HIGH (ref 0.0–0.2)

## 2022-04-28 LAB — HEPATIC FUNCTION PANEL
ALT: 47 U/L — ABNORMAL HIGH (ref 0–44)
AST: 81 U/L — ABNORMAL HIGH (ref 15–41)
Albumin: 2.1 g/dL — ABNORMAL LOW (ref 3.5–5.0)
Alkaline Phosphatase: 136 U/L — ABNORMAL HIGH (ref 38–126)
Bilirubin, Direct: 0.7 mg/dL — ABNORMAL HIGH (ref 0.0–0.2)
Indirect Bilirubin: 0.6 mg/dL (ref 0.3–0.9)
Total Bilirubin: 1.3 mg/dL — ABNORMAL HIGH (ref 0.3–1.2)
Total Protein: 6.1 g/dL — ABNORMAL LOW (ref 6.5–8.1)

## 2022-04-28 LAB — BASIC METABOLIC PANEL
Anion gap: 9 (ref 5–15)
BUN: 5 mg/dL — ABNORMAL LOW (ref 6–20)
CO2: 23 mmol/L (ref 22–32)
Calcium: 8.3 mg/dL — ABNORMAL LOW (ref 8.9–10.3)
Chloride: 100 mmol/L (ref 98–111)
Creatinine, Ser: 0.58 mg/dL — ABNORMAL LOW (ref 0.61–1.24)
GFR, Estimated: >60 mL/min (ref >60)
Glucose, Bld: 100 mg/dL — ABNORMAL HIGH (ref 70–99)
Potassium: 3.6 mmol/L (ref 3.5–5.1)
Sodium: 132 mmol/L — ABNORMAL LOW (ref 135–145)

## 2022-04-28 LAB — MAGNESIUM: Magnesium: 1.4 mg/dL — ABNORMAL LOW (ref 1.7–2.4)

## 2022-04-28 LAB — PHOSPHORUS: Phosphorus: 2.4 mg/dL — ABNORMAL LOW (ref 2.5–4.6)

## 2022-04-28 LAB — VITAMIN B1: Vitamin B1 (Thiamine): 76 nmol/L (ref 66.5–200.0)

## 2022-04-28 MED ORDER — MAGNESIUM SULFATE 4 GM/100ML IV SOLN
4.0000 g | Freq: Once | INTRAVENOUS | Status: AC
  Administered 2022-04-28: 4 g via INTRAVENOUS
  Filled 2022-04-28: qty 100

## 2022-04-28 MED ORDER — FERROUS SULFATE 325 (65 FE) MG PO TABS
325.0000 mg | ORAL_TABLET | Freq: Two times a day (BID) | ORAL | Status: DC
  Administered 2022-04-28 – 2022-05-11 (×26): 325 mg via ORAL
  Filled 2022-04-28 (×26): qty 1

## 2022-04-28 MED ORDER — AMOXICILLIN-POT CLAVULANATE 875-125 MG PO TABS
1.0000 | ORAL_TABLET | Freq: Two times a day (BID) | ORAL | Status: AC
  Administered 2022-04-28 – 2022-04-30 (×6): 1 via ORAL
  Filled 2022-04-28 (×6): qty 1

## 2022-04-28 NOTE — Progress Notes (Addendum)
PROGRESS NOTE Dylan Weaver  [id number] DOB: July 15, 1966 DOA: 04/25/2022 PCP: [facility]   Brief Narrative/Hospital Course: 56 y.o.m w/ history of alcohol abuse, previous history of alcoholic hepatitis brought into the ED by EMS after neighbor found the patient lying on the floor covered in feces and surrounded by empty beer cans.  Patient apparently reported to EMS that he fell 2 days prior to presentation. He was confused and history is very limited.  Was complaining of headache.  In ED-initially blood pressure was soft, labs showed-hyponatremia hypokalemia elevated LFTs and anemia,Imaging studies-CT head chest x-ray CT cervical spine, CT chest abdomen pelvis did not show any obvious injuries except likely healed old clavicular fracture, healed old left rib fracture,chronic L1 compression fracture hepatic steatosis, cholelithiasis, scattered left upper lobe groundglass airspace opacities.    Subjective: Seen and examined this morning Reports he does not have diarrhea anymore Appears alert awake  no BM charted yesterday left-sided low back, improving potassium and LFTs   Assessment and Plan: Principal Problem:   Hyponatremia Active Problems:   Alcohol use disorder with alcohol withdrawal    Normochromic normocytic anemia   Elevated LFTs   Prolonged QT interval   Pressure ulcer   Acute metabolic encephalopathy   Hypokalemia   Aspiration pneumonia (HCC)   Enteropathogenic Escherichia coli infection   Enteropathogenic E. coli diarrhea: Continue enteric precaution, supportive care.  Diarrhea improved weaned off IV fluids encourage p.o. intake.  Hypovolemic hyponatremia Dehydration/ketosis in urine: Sodium stable at 132, improved tolerating diet stop IV fluids, Recent Labs  Lab 04/25/22 1536 04/26/22 0100 04/26/22 1000 04/27/22 0103 04/28/22 0358  NA 124* 130* 130* 131* 132*    Hypokalemia-likely from diarrhea and decreased oral  intake.Resolved Hypomagnesemia: Repleted IV -recheck in am. Recent Labs  Lab 04/25/22 1456 04/25/22 1536 04/26/22 0100 04/26/22 0436 04/26/22 1000 04/26/22 1530 04/27/22 0103 04/28/22 0358  K 2.7*   < > 3.0*  --  3.9 3.4* 3.6 3.6  CALCIUM 9.3  --  8.4*  --  8.4*  --  8.2* 8.3*  MG 2.1  --   --  2.0  --   --   --  1.4*  PHOS  --   --   --   --   --   --   --  2.4*   < > = values in this interval not displayed.  Aspiration pneumonia with CT showing-left-sided groundglass opacities cxr neg.Likely aspirated.He has very poor oral dentition.  Remains afebrile, change unasyn to augmentin for 3 more. COVID-19 PCR negative.  Transaminitis/abnormal lfts: likely etoh related.likely secondary to his alcohol abuse. CT scan showed cholelithiasis without evidence of cholecystitis.Hepatic steatosis noted.  Acute hepatitis panel negative.  AST/ALT 69/50 T. bili2.7 >improving.    Acute metabolic encephalopathy:  likely due to metabolic derangements/alcoholism/ aspiration.non focal on exam. CT head- non acute-ammonia level normal.  Due to his alcohol abuse history started on high-dose thiamine,ciwa ativan, etoh cessation counseling. PTOT eval-advised skilled nursing facility, mental status has improved   Alcohol abuse with alcohol withdrawal: Last alcohol use unknown patient reports 2 days PTA, drinks at least 12 beers on daily basis, possibly liquid.  Continue high-dose thiamine, folic acid, CIWA scale Ativan prn. TOC consult for cessation.  Mildly elevated lactic acid likely from dehydration related decreased clearance due to his hepatic derangement steatosis.  Resolved  Old healed left rib fractures/old clavicle fracture/chronic L1 compression fracture : Respiratory status is stable, no chest pain.  Continue incentive spirometry, pain control.  Normocytic anemia Folic acid deficiency Hemoglobin was down trending but up in mid 7 now- hemoccult is negative, no evidence of acute blood loss.- trend and  transfuse if less than 7 g.  Started folic acid supplement.  B12 is normal, iron normal 53 ferritin high 913.Last colonoscopy couple of years ago he says and was " okay".  Patient will need extensive follow-up with GI PCP and age-appropriate cancer screening as outpatient. Add po iron Recent Labs  Lab 04/26/22 0100 04/26/22 1000 04/26/22 1741 04/27/22 0103 04/28/22 0358  HGB 7.4* 9.1* 7.9* 7.3* 7.5*  HCT 20.6* 26.6* 22.7* 21.6* 22.3*      Cholelithiasis: Noted incidentally on CT scan.No cholecystitis. Monitor.  QT prolongation: Likely from electrolyte imbalance repleting electrolytes aggressively.monitor in tele. Ekg in am again.   Deconditioning/debility obtain PT OT eval, encourage ambulation  Pressure ulcer POA on the back stage II on the left shoulder DTI Pressure Injury 04/26/22 Back Mid Stage 2 -  Partial thickness loss of dermis presenting as a shallow open injury with a red, pink wound bed without slough. (Active)  04/26/22 1517  Location: Back  Location Orientation: Mid  Staging: Stage 2 -  Partial thickness loss of dermis presenting as a shallow open injury with a red, pink wound bed without slough.  Wound Description (Comments):   Present on Admission: Yes     Pressure Injury 04/27/22 Shoulder Left;Posterior Deep Tissue Pressure Injury - Purple or maroon localized area of discolored intact skin or blood-filled blister due to damage of underlying soft tissue from pressure and/or shear. (Active)  04/27/22   Location: Shoulder  Location Orientation: Left;Posterior  Staging: Deep Tissue Pressure Injury - Purple or maroon localized area of discolored intact skin or blood-filled blister due to damage of underlying soft tissue from pressure and/or shear.  Wound Description (Comments):   Present on Admission: Yes     DVT prophylaxis: SCDs Start: 04/25/22 1729.  No chemical prophylaxis due to anemia/ and drop in hemoglobin  Code Status:   Code Status: Full Code Family  Communication: plan of care discussed with patient at bedside.  Patient status is: Inpatient because of ongoing management of anemia, deconditioning and debility pending PT OT evaluation   Level of care: Progressive  Dispo: The patient is from: home            Anticipated disposition: SNF hopefully over the weekend pending bed.    Mobility Assessment (last 72 hours)     Mobility Assessment     Row Name 04/28/22 1250 04/28/22 0815 04/27/22 2009 04/27/22 1400 04/27/22 0823   Does patient have an order for bedrest or is patient medically unstable -- No - Continue assessment No - Continue assessment -- No - Continue assessment   What is the highest level of mobility based on the progressive mobility assessment? Level 4 (Walks with assist in room) - Balance while marching in place and cannot step forward and back - Complete Level 3 (Stands with assist) - Balance while standing  and cannot march in place -- Level 3 (Stands with assist) - Balance while standing  and cannot march in place Level 3 (Stands with assist) - Balance while standing  and cannot march in place   Is the above level different from baseline mobility prior to current illness? -- Yes - Recommend PT order -- -- Yes - Recommend PT order    Row Name 04/26/22 2000 04/26/22 1509         Does patient have an order  for bedrest or is patient medically unstable No - Continue assessment No - Continue assessment      What is the highest level of mobility based on the progressive mobility assessment? Level 3 (Stands with assist) - Balance while standing  and cannot march in place Level 3 (Stands with assist) - Balance while standing  and cannot march in place      Is the above level different from baseline mobility prior to current illness? -- Yes - Recommend PT order                Objective: Vitals last 24 hrs: Vitals:   04/27/22 2103 04/28/22 0435 04/28/22 0436 04/28/22 0800  BP: 127/74 91/63 93/65  105/62  Pulse: 79 76 76 74   Resp: 19 15    Temp: 98.6 F (37 C) 98.4 F (36.9 C)    TempSrc: Oral Oral    SpO2: 94% 99%    Weight:      Height:       Weight change:   Physical Examination: General exam: AAox3, older than stated age, weak appearing. HEENT:Oral mucosa moist, Ear/Nose WNL grossly, dentition normal. Respiratory system: bilaterally diminished, no use of accessory muscle Cardiovascular system: S1 & S2 +, No JVD,. Gastrointestinal system: Abdomen soft,NT,ND,BS+ Nervous System:Alert, awake, moving extremities and grossly nonfocal Extremities: LE ankle edema neg, distal peripheral pulses palpable.  Skin: No rashes,no icterus. MSK: Normal muscle bulk,tone, power   Medications reviewed:  Scheduled Meds:  amoxicillin-clavulanate  1 tablet Oral Q12H   vitamin C  500 mg Oral BID   feeding supplement  237 mL Oral TID BM   ferrous sulfate  325 mg Oral BID WC   multivitamin with minerals  1 tablet Oral Daily   [START ON 04/29/2022] thiamine (VITAMIN B1) injection  100 mg Intravenous Q8H   Continuous Infusions:  folic acid 1 mg in sodium chloride 0.9 % 50 mL IVPB 1 mg (04/28/22 0852)   thiamine (VITAMIN B1) injection 400 mg (04/28/22 [id number])   Diet Order             Diet regular Room service appropriate? Yes; Fluid consistency: Thin  Diet effective now                  Intake/Output Summary (Last 24 hours) at 04/28/2022 1254 Last data filed at 04/28/2022 0900 Gross per 24 hour  Intake 780 ml  Output 350 ml  Net 430 ml   Net IO Since Admission: 4,674.49 mL [04/28/22 1254]  Wt Readings from Last 3 Encounters:  04/25/22 83.9 kg  10/24/21 79.4 kg  09/20/21 79.4 kg     Unresulted Labs (From admission, onward)     Start     Ordered   04/29/22 0500  Magnesium  Tomorrow morning,   R       Question:  Specimen collection method  Answer:  Lab=Lab collect   04/28/22 1139   04/27/22 0500  CBC  Daily at 5am,   R      04/26/22 0732   04/27/22 [id number]  Basic metabolic panel  Daily at 5am,   R       04/26/22 0732   04/26/22 0500  Vitamin B1  Tomorrow morning,   R        04/25/22 1732          Data Reviewed: I have personally reviewed following labs and imaging studies CBC: Recent Labs  Lab 04/25/22 1456 04/25/22 1536 04/26/22 0100 04/26/22 1000 04/26/22 1741 04/27/22  0103 04/28/22 0358  WBC 13.0*  --  10.5  --   --  9.8 8.0  NEUTROABS 10.9*  --   --   --   --   --   --   HGB 9.6*   < > 7.4* 9.1* 7.9* 7.3* 7.5*  HCT 26.7*   < > 20.6* 26.6* 22.7* 21.6* 22.3*  MCV 94.0  --  94.9  --   --  98.2 99.1  PLT 181  --  137*  --   --  132* 129*   < > = values in this interval not displayed.   Basic Metabolic Panel: Recent Labs  Lab 04/25/22 1456 04/25/22 1536 04/26/22 0100 04/26/22 0436 04/26/22 1000 04/26/22 1530 04/27/22 0103 04/28/22 0358  NA 126* 124* 130*  --  130*  --  131* 132*  K 2.7* 2.8* 3.0*  --  3.9 3.4* 3.6 3.6  CL 79* 79* 90*  --  92*  --  98 100  CO2 29  --  30  --  26  --  26 23  GLUCOSE 121* 123* 88  --  72  --  99 100*  BUN 15 13 12   --  10  --  7 5*  CREATININE 0.66 0.50* 0.56*  --  0.38*  --  0.52* 0.58*  CALCIUM 9.3  --  8.4*  --  8.4*  --  8.2* 8.3*  MG 2.1  --   --  2.0  --   --   --  1.4*  PHOS  --   --   --   --   --   --   --  2.4*   GFR: Estimated Creatinine Clearance: 114.5 mL/min (A) (by C-G formula based on SCr of 0.58 mg/dL (L)). Liver Function Tests: Recent Labs  Lab 04/25/22 1456 04/26/22 0100 04/28/22 0358  AST 93* 69* 81*  ALT 67* 50* 47*  ALKPHOS 175* 132* 136*  BILITOT 4.0* 2.7* 1.3*  PROT 8.9* 6.9 6.1*  ALBUMIN 3.1* 2.4* 2.1*   Recent Labs  Lab 04/25/22 1456  LIPASE 38   Recent Labs  Lab 04/25/22 1708 04/26/22 0436  AMMONIA 22 19   Coagulation Profile: Recent Labs  Lab 04/25/22 1456  INR 1.0   BNP (last 3 results) No results for input(s): "PROBNP" in the last 8760 hours. HbA1C: No results for input(s): "HGBA1C" in the last 72 hours. CBG: Recent Labs  Lab 04/26/22 0433  GLUCAP 76   Lipid  Profile: No results for input(s): "CHOL", "HDL", "LDLCALC", "TRIG", "CHOLHDL", "LDLDIRECT" in the last 72 hours. Thyroid Function Tests: No results for input(s): "TSH", "T4TOTAL", "FREET4", "T3FREE", "THYROIDAB" in the last 72 hours. Sepsis Labs: Recent Labs  Lab 04/25/22 1456 04/25/22 1708 04/26/22 1530  LATICACIDVEN 2.2* 2.0* 1.0    Recent Results (from the past 240 hour(s))  Blood Culture (routine x 2)     Status: None (Preliminary result)   Collection Time: 04/25/22  2:44 PM   Specimen: Site Not Specified; Blood  Result Value Ref Range Status   Specimen Description   Final    SITE NOT SPECIFIED Performed at [facility], [address], [place], [place]    Special Requests   Final    BOTTLES DRAWN AEROBIC AND ANAEROBIC Blood Culture adequate volume Performed at [facility], [address] [place], [place]    Culture   Final    NO GROWTH 3 DAYS Performed at [facility], [place] [address], [place], [place] [address]    Report Status PENDING  Incomplete  Blood Culture (routine x 2)     Status: None (Preliminary result)   Collection Time: 04/25/22  2:56 PM   Specimen: BLOOD LEFT HAND  Result Value Ref Range Status   Specimen Description   Final    BLOOD LEFT HAND Performed at [place] [address], [place], [place] [address]    Special Requests   Final    BOTTLES DRAWN AEROBIC ONLY Blood Culture results may not be optimal due to an inadequate volume of blood received in culture bottles Performed at [place] [address], [place], [place] [address]    Culture   Final    NO GROWTH 3 DAYS Performed at [facility], [place] [address], [place], [place] [address]    Report Status PENDING  Incomplete  SARS Coronavirus 2 by RT PCR (hospital order, performed in [facility] hospital lab) *cepheid single result test* Anterior Nasal Swab     Status: None    Collection Time: 04/25/22  5:04 PM   Specimen: Anterior Nasal Swab  Result Value Ref Range Status   SARS Coronavirus 2 by RT PCR NEGATIVE NEGATIVE Final    Comment: (NOTE) SARS-CoV-2 target nucleic acids are NOT DETECTED.  The SARS-CoV-2 RNA is generally detectable in upper and lower respiratory specimens during the acute phase of infection. The lowest concentration of SARS-CoV-2 viral copies this assay can detect is 250 copies / mL. A negative result does not preclude SARS-CoV-2 infection and should not be used as the sole basis for treatment or other patient management decisions.  A negative result may occur with improper specimen collection / handling, submission of specimen other than nasopharyngeal swab, presence of viral mutation(s) within the areas targeted by this assay, and inadequate number of viral copies (<250 copies / mL). A negative result must be combined with clinical observations, patient history, and epidemiological information.  Fact Sheet for Patients:   https://www.patel.info/  Fact Sheet for Healthcare Providers: https://hall.com/  This test is not yet approved or  cleared by the [place] FDA and has been authorized for detection and/or diagnosis of SARS-CoV-2 by FDA under an Emergency Use Authorization (EUA).  This EUA will remain in effect (meaning this test can be used) for the duration of the COVID-19 declaration under Section 564(b)(1) of the Act, 21 U.S.C. section 360bbb-3(b)(1), unless the authorization is terminated or revoked sooner.  Performed at [facility], [place] [address] [place], [place] [address]   C Difficile Quick Screen w PCR reflex     Status: None   Collection Time: 04/26/22  6:00 PM   Specimen: STOOL  Result Value Ref Range Status   C Diff antigen NEGATIVE NEGATIVE Final   C Diff toxin NEGATIVE NEGATIVE Final   C Diff interpretation No C. difficile detected.  Final     Comment: Performed at [facility], [place] [address], [place], [place] [address]  Gastrointestinal Panel by PCR , Stool     Status: Abnormal   Collection Time: 04/26/22  6:00 PM   Specimen: STOOL  Result Value Ref Range Status   Campylobacter species NOT DETECTED NOT DETECTED Final   Plesimonas shigelloides NOT DETECTED NOT DETECTED Final   Salmonella species NOT DETECTED NOT DETECTED Final   Yersinia enterocolitica NOT DETECTED NOT DETECTED Final   Vibrio species NOT DETECTED NOT DETECTED Final   Vibrio  cholerae NOT DETECTED NOT DETECTED Final   Enteroaggregative E coli (EAEC) NOT DETECTED NOT DETECTED Final   Enteropathogenic E coli (EPEC) DETECTED (A) NOT DETECTED Final    Comment: RESULT CALLED TO, READ BACK BY AND VERIFIED WITH: DERRICK RICHTER 04/27/22 1028 KLW    Enterotoxigenic E coli (ETEC) NOT DETECTED NOT DETECTED Final   Shiga like toxin producing E coli (STEC) NOT DETECTED NOT DETECTED Final   Shigella/Enteroinvasive E coli (EIEC) NOT DETECTED NOT DETECTED Final   Cryptosporidium NOT DETECTED NOT DETECTED Final   Cyclospora cayetanensis NOT DETECTED NOT DETECTED Final   Entamoeba histolytica NOT DETECTED NOT DETECTED Final   Giardia lamblia NOT DETECTED NOT DETECTED Final   Adenovirus F40/41 NOT DETECTED NOT DETECTED Final   Astrovirus NOT DETECTED NOT DETECTED Final   Norovirus GI/GII NOT DETECTED NOT DETECTED Final   Rotavirus A NOT DETECTED NOT DETECTED Final   Sapovirus (I, II, IV, and V) NOT DETECTED NOT DETECTED Final    Comment: Performed at [facility], [address] [place], [place] [address]    Antimicrobials: Anti-infectives (From admission, onward)    Start     Dose/Rate Route Frequency Ordered Stop   04/28/22 1400  amoxicillin-clavulanate (AUGMENTIN) 875-125 MG per tablet 1 tablet        1 tablet Oral Every 12 hours 04/28/22 1139 05/01/22 0959   04/25/22 1800  Ampicillin-Sulbactam (UNASYN) 3 g in sodium chloride 0.9 % 100  mL IVPB  Status:  Discontinued        3 g 200 mL/hr over 30 Minutes Intravenous Every 6 hours 04/25/22 1748 04/28/22 1139      Culture/Microbiology    Component Value Date/Time   SDES  04/25/2022 1456    BLOOD LEFT HAND Performed at [facility], [address], [place], [place] [address]    SPECREQUEST  04/25/2022 1456    BOTTLES DRAWN AEROBIC ONLY Blood Culture results may not be optimal due to an inadequate volume of blood received in culture bottles Performed at [facility], [address], [place], [place] [address]    CULT  04/25/2022 1456    NO GROWTH 3 DAYS Performed at [facility], [address] [place], [place] [address]    REPTSTATUS PENDING 04/25/2022 1456  Other culture-see note  Radiology Studies: No results found.   LOS: 3 days   Lanae Boast, MD [facility]  04/28/2022, 12:54 PM

## 2022-04-28 NOTE — Evaluation (Signed)
Occupational Therapy Evaluation Patient Details Name: Dylan Weaver MRN: [id number] DOB: July 02, 1966 Today's Date: 04/28/2022   History of Present Illness 56 y.o.m w/ history of alcohol abuse, previous history of alcoholic hepatitis brought into the ED by EMS after neighbor found the patient lying on the floor covered in feces and surrounded by empty beer cans.  Patient apparently reported to EMS that he fell 2 days prior to presentation. He was confused and history is very limited.  Was complaining of headache.   Pt admitted 04/25/22 for hyponatremia and Enteropathogenic E. coli diarrhea   Clinical Impression   Mr. Marguerite Jarboe is a 56 year old man who presents with generalized weakness, dereased actvity tolerance and impaired balance as well as mild confusion. On evaluation he needs increased assistance for transfers, minimal ambulation and max-total for LB ADLs. He is typically independent and lives alone.  Patient will benefit from skilled OT services while in hospital to improve deficits and learn compensatory strategies as needed in order to return to PLOF.       Recommendations for follow up therapy are one component of a multi-disciplinary discharge planning process, led by the attending physician.  Recommendations may be updated based on patient status, additional functional criteria and insurance authorization.   Follow Up Recommendations  Skilled nursing-short term rehab (<3 hours/day)    Assistance Recommended at Discharge Frequent or constant Supervision/Assistance  Patient can return home with the following A little help with walking and/or transfers;A lot of help with bathing/dressing/bathroom;Assistance with cooking/housework    Functional Status Assessment  Patient has had a recent decline in their functional status and demonstrates the ability to make significant improvements in function in a reasonable and predictable amount of time.  Equipment Recommendations  Other  (comment) (TBD)    Recommendations for Other Services       Precautions / Restrictions Precautions Precautions: Fall Restrictions Weight Bearing Restrictions: No      Mobility Bed Mobility Overal bed mobility: Needs Assistance Bed Mobility: Supine to Sit   Sidelying to sit: Supervision       General bed mobility comments: utilizes bed rail to self assist    Transfers Overall transfer level: Needs assistance Equipment used: Rolling walker (2 wheels) Transfers: Sit to/from Stand Sit to Stand: Min guard, From elevated surface Stand pivot transfers: Min assist         General transfer comment: min guard to rise from elevated bed height and min assist to take steps to recilner. Had an uncontroleld descent into recliner.      Balance Overall balance assessment: History of Falls                                         ADL either performed or assessed with clinical judgement   ADL Overall ADL's : Needs assistance/impaired Eating/Feeding: Set up;Sitting   Grooming: Set up;Sitting   Upper Body Bathing: Set up;Sitting;Supervision/ safety   Lower Body Bathing: Moderate assistance;Sit to/from stand;Set up   Upper Body Dressing : Set up;Sitting   Lower Body Dressing: Maximal assistance;Sit to/from stand   Toilet Transfer: Minimal assistance;Rolling walker (2 wheels);BSC/3in1   Toileting- Clothing Manipulation and Hygiene: Maximal assistance;Sit to/from stand       Functional mobility during ADLs: Minimal assistance;Rolling walker (2 wheels)       Vision Patient Visual Report: No change from baseline       Perception  Praxis      Pertinent Vitals/Pain Pain Assessment Pain Assessment: No/denies pain     Hand Dominance     Extremity/Trunk Assessment Upper Extremity Assessment Upper Extremity Assessment: Overall WFL for tasks assessed (grossly 4/5 strength)   Lower Extremity Assessment Lower Extremity Assessment: Defer to PT  evaluation   Cervical / Trunk Assessment Cervical / Trunk Assessment: Normal   Communication Communication Communication: No difficulties   Cognition Arousal/Alertness: Awake/alert Behavior During Therapy: WFL for tasks assessed/performed Overall Cognitive Status: Within Functional Limits for tasks assessed                                 General Comments: Alert to self and that he is in the hospital and month. Grossly alert to situation.     General Comments       Exercises     Shoulder Instructions      Home Living Family/patient expects to be discharged to:: Private residence Living Arrangements: Alone   Type of Home: House Home Access: Stairs to enter [employer] of Steps: 3-4 Entrance Stairs-Rails: Right;Left Home Layout: One level               Home Equipment: Agricultural consultant (2 wheels);Cane - single point          Prior Functioning/Environment Prior Level of Function : Independent/Modified Independent                        OT Problem List: Decreased activity tolerance;Decreased strength;Impaired balance (sitting and/or standing);Decreased safety awareness;Decreased cognition;Decreased knowledge of use of DME or AE      OT Treatment/Interventions: Self-care/ADL training;Therapeutic exercise;DME and/or AE instruction;Therapeutic activities;Balance training;Patient/family education    OT Goals(Current goals can be found in the care plan section) Acute Rehab OT Goals Patient Stated Goal: to get stronger and be safe OT Goal Formulation: With patient Time For Goal Achievement: 05/12/22 Potential to Achieve Goals: Good  OT Frequency: Min 2X/week    Co-evaluation              AM-PAC OT "6 Clicks" Daily Activity     Outcome Measure Help from another person eating meals?: A Little Help from another person taking care of personal grooming?: A Little Help from another person toileting, which includes using toliet,  bedpan, or urinal?: Total Help from another person bathing (including washing, rinsing, drying)?: A Lot Help from another person to put on and taking off regular upper body clothing?: A Little Help from another person to put on and taking off regular lower body clothing?: A Lot 6 Click Score: 14   End of Session Equipment Utilized During Treatment: Rolling walker (2 wheels) Nurse Communication: Mobility status  Activity Tolerance: Patient tolerated treatment well Patient left: in chair;with call bell/phone within reach;with chair alarm set  OT Visit Diagnosis: Unsteadiness on feet (R26.81);History of falling (Z91.81)                Time: 0160-1093 OT Time Calculation (min): 18 min Charges:  OT General Charges $OT Visit: 1 Visit OT Evaluation $OT Eval Low Complexity: 1 Low  Weston Kallman, OTR/L Acute Care Rehab Services  Office [phone] Pager: [phone]   Kelli Churn 04/28/2022, 12:52 PM

## 2022-04-29 DIAGNOSIS — E871 Hypo-osmolality and hyponatremia: Secondary | ICD-10-CM | POA: Diagnosis not present

## 2022-04-29 LAB — BASIC METABOLIC PANEL
Anion gap: 7 (ref 5–15)
BUN: 9 mg/dL (ref 6–20)
CO2: 22 mmol/L (ref 22–32)
Calcium: 8.1 mg/dL — ABNORMAL LOW (ref 8.9–10.3)
Chloride: 104 mmol/L (ref 98–111)
Creatinine, Ser: 0.55 mg/dL — ABNORMAL LOW (ref 0.61–1.24)
GFR, Estimated: >60 mL/min (ref >60)
Glucose, Bld: 97 mg/dL (ref 70–99)
Potassium: 3.6 mmol/L (ref 3.5–5.1)
Sodium: 133 mmol/L — ABNORMAL LOW (ref 135–145)

## 2022-04-29 LAB — CBC
HCT: 22.8 % — ABNORMAL LOW (ref 39.0–52.0)
Hemoglobin: 7.7 g/dL — ABNORMAL LOW (ref 13.0–17.0)
MCH: 34.2 pg — ABNORMAL HIGH (ref 26.0–34.0)
MCHC: 33.8 g/dL (ref 30.0–36.0)
MCV: 101.3 fL — ABNORMAL HIGH (ref 80.0–100.0)
Platelets: 118 10*3/uL — ABNORMAL LOW (ref 150–400)
RBC: 2.25 MIL/uL — ABNORMAL LOW (ref 4.22–5.81)
RDW: 15.9 % — ABNORMAL HIGH (ref 11.5–15.5)
WBC: 7 10*3/uL (ref 4.0–10.5)
nRBC: 0 % (ref 0.0–0.2)

## 2022-04-29 LAB — MAGNESIUM: Magnesium: 1.6 mg/dL — ABNORMAL LOW (ref 1.7–2.4)

## 2022-04-29 MED ORDER — FOLIC ACID 1 MG PO TABS
1.0000 mg | ORAL_TABLET | Freq: Every day | ORAL | Status: DC
  Administered 2022-04-29 – 2022-05-11 (×13): 1 mg via ORAL
  Filled 2022-04-29 (×13): qty 1

## 2022-04-29 MED ORDER — K PHOS MONO-SOD PHOS DI & MONO 155-852-130 MG PO TABS
250.0000 mg | ORAL_TABLET | Freq: Three times a day (TID) | ORAL | Status: AC
  Administered 2022-04-29 – 2022-04-30 (×6): 250 mg via ORAL
  Filled 2022-04-29 (×6): qty 1

## 2022-04-29 MED ORDER — MAGNESIUM SULFATE 2 GM/50ML IV SOLN
2.0000 g | Freq: Once | INTRAVENOUS | Status: AC
  Administered 2022-04-29: 2 g via INTRAVENOUS
  Filled 2022-04-29: qty 50

## 2022-04-29 MED ORDER — THIAMINE HCL 100 MG PO TABS
100.0000 mg | ORAL_TABLET | Freq: Every day | ORAL | Status: DC
  Administered 2022-04-29 – 2022-05-11 (×13): 100 mg via ORAL
  Filled 2022-04-29 (×13): qty 1

## 2022-04-29 NOTE — Progress Notes (Signed)
Patient assisted to chair from bed, requiring front wheel walker and minimal assist.  Bradd Burner, RN

## 2022-04-29 NOTE — Progress Notes (Signed)
PROGRESS NOTE Lonas Calver  [id number] DOB: [birth year] DOA: 04/25/2022 PCP: [facility]   Brief Narrative/Hospital Course: 56 y.o.m w/ history of alcohol abuse, previous history of alcoholic hepatitis brought into the ED by EMS after neighbor found the patient lying on the floor covered in feces and surrounded by empty beer cans.  Patient apparently reported to EMS that he fell 2 days prior to presentation. He was confused and history is very limited.  Was complaining of headache.  In ED-initially blood pressure was soft, labs showed-hyponatremia hypokalemia elevated LFTs and anemia,Imaging studies-CT head chest x-ray CT cervical spine, CT chest abdomen pelvis did not show any obvious injuries except likely healed old clavicular fracture, healed old left rib fracture,chronic L1 compression fracture hepatic steatosis, cholelithiasis, scattered left upper lobe groundglass airspace opacities.  Patient has treated with Unasyn.  He had diarrhea and GI panel came back positive for enteropathogenic E. coli and resolved without treatment he had hypovolemic hyponatremia dehydration that resolved with IV fluids.  His encephalopathy has significantly improved.  He remains weak and deconditioned PT OT suggesting skilled nursing facility placement    Subjective: Seen and examined this morning alert awake Reports he had BM yesterday-not charted Overnight afebrile BP soft in 90s Labs with stable renal function and sodium level improving magnesium uptrending hemoglobin 7.7 g and thrombocytopenia    Assessment and Plan: Principal Problem:   Hyponatremia Active Problems:   Alcohol use disorder with alcohol withdrawal    Thrombocytopenia (HCC)   Normochromic normocytic anemia   Elevated LFTs   Prolonged QT interval   Pressure ulcer   Acute metabolic encephalopathy   Hypokalemia   Aspiration pneumonia (HCC)   Enteropathogenic Escherichia coli infection   Hypophosphatemia    Enteropathogenic E. coli diarrhea: diarrhea resolved.  Continue enteric precaution continue oral diet  Hypovolemic hyponatremia Dehydration/ketosis in urine: Resolved.Tolerating p.o. off IV fluids    Hypokalemia/hypomagnesemia hypophosphatemia: K improved for slightly low mag low will replete orally.  Replete mag IV Recent Labs  Lab 04/25/22 1456 04/25/22 1536 04/26/22 0100 04/26/22 0436 04/26/22 1000 04/26/22 1530 04/27/22 0103 04/28/22 0358 04/29/22 0422  K 2.7*   < > 3.0*  --  3.9 3.4* 3.6 3.6 3.6  CALCIUM 9.3  --  8.4*  --  8.4*  --  8.2* 8.3* 8.1*  MG 2.1  --   --  2.0  --   --   --  1.4* 1.6*  PHOS  --   --   --   --   --   --   --  2.4*  --    < > = values in this interval not displayed.   Aspiration pneumonia with CT showing-left-sided groundglass opacities cxr neg.Likely aspirated.He has very poor oral dentition.  Remains afebrile, changed unasyn to augmentin for 3 more days on 7.28.COVID-19 PCR negative.  Transaminitis/abnormal lfts: likely etoh related.likely secondary to his alcohol abuse. CT scan showed cholelithiasis without evidence of cholecystitis.Hepatic steatosis noted.  Acute hepatitis panel negative.  AST/ALT 69/50 T. bili2.7 > downtrending.  Trend   Acute metabolic encephalopathy:  likely due to metabolic derangements/alcoholism/ aspiration.non focal on exam. CT head- non acute-ammonia level normal.  Due to his alcohol abuse history started on high-dose thiamine,ciwa ativan, etoh cessation counseling.  Mental status has improved.  PTOT eval-advised skilled nursing facility, mental status has improved   Alcohol abuse with alcohol withdrawal: Last alcohol use unknown patient reports 2 days PTA, drinks at least 12 beers on daily basis, possibly liquid.  Continue high-dose thiamine, folic acid, CIWA scale Ativan prn. TOC consult for cessation.  Mildly elevated lactic acid likely from dehydration related decreased clearance due to his hepatic derangement  steatosis.  Resolved  Old healed left rib fractures/old clavicle fracture/chronic L1 compression fracture : Respiratory status is stable, no chest pain.  Continue incentive spirometry, pain control.    Normocytic anemia Folic acid deficiency Thrombocytopenia: Suspect multifactorial hemodilution, anemia of chronic disease.  No active bleeding noted Hemoccult was negative.B12 is normal, iron normal 53 ferritin high 913.Last colonoscopy couple of years ago he says and was " okay" per him. Patient will need extensive follow-up with GI PCP and age-appropriate cancer screening as outpatient. Added po iron Recent Labs  Lab 04/26/22 1000 04/26/22 1741 04/27/22 0103 04/28/22 0358 04/29/22 0422  HGB 9.1* 7.9* 7.3* 7.5* 7.7*  HCT 26.6* 22.7* 21.6* 22.3* 22.8*    Cholelithiasis: Noted incidentally on CT scan.No cholecystitis. Monitor.  QT prolongation: Likely from electrolyte imbalance repleting electrolytes aggressively.monitor in tele. Ekg in am again.   Deconditioning/debility obtain PT OT eval, encourage ambulation  Pressure ulcer POA on the back stage II on the left shoulder DTI Pressure Injury 04/26/22 Back Mid Stage 2 -  Partial thickness loss of dermis presenting as a shallow open injury with a red, pink wound bed without slough. (Active)  04/26/22 1517  Location: Back  Location Orientation: Mid  Staging: Stage 2 -  Partial thickness loss of dermis presenting as a shallow open injury with a red, pink wound bed without slough.  Wound Description (Comments):   Present on Admission: Yes     Pressure Injury 04/27/22 Shoulder Left;Posterior Deep Tissue Pressure Injury - Purple or maroon localized area of discolored intact skin or blood-filled blister due to damage of underlying soft tissue from pressure and/or shear. (Active)  04/27/22   Location: Shoulder  Location Orientation: Left;Posterior  Staging: Deep Tissue Pressure Injury - Purple or maroon localized area of discolored intact  skin or blood-filled blister due to damage of underlying soft tissue from pressure and/or shear.  Wound Description (Comments):   Present on Admission: Yes     DVT prophylaxis: SCDs Start: 04/25/22 1729.  No chemical prophylaxis due to anemia/ and drop in hemoglobin  Code Status:   Code Status: Full Code Family Communication: plan of care discussed with patient at bedside.  Patient status is: Inpatient because of ongoing management of anemia, deconditioning and debility pending PT OT evaluation   Level of care: Progressive  Dispo: The patient is from: home            Anticipated disposition: SNF once available.  Medically stable   Mobility Assessment (last 72 hours)     Mobility Assessment     Row Name 04/28/22 2026 04/28/22 1250 04/28/22 0815 04/27/22 2009 04/27/22 1400   Does patient have an order for bedrest or is patient medically unstable No - Continue assessment -- No - Continue assessment No - Continue assessment --   What is the highest level of mobility based on the progressive mobility assessment? -- Level 4 (Walks with assist in room) - Balance while marching in place and cannot step forward and back - Complete Level 3 (Stands with assist) - Balance while standing  and cannot march in place -- Level 3 (Stands with assist) - Balance while standing  and cannot march in place   Is the above level different from baseline mobility prior to current illness? -- -- Yes - Recommend PT order -- --  Row Name 04/27/22 2694 04/26/22 2000 04/26/22 1509       Does patient have an order for bedrest or is patient medically unstable No - Continue assessment No - Continue assessment No - Continue assessment     What is the highest level of mobility based on the progressive mobility assessment? Level 3 (Stands with assist) - Balance while standing  and cannot march in place Level 3 (Stands with assist) - Balance while standing  and cannot march in place Level 3 (Stands with assist) - Balance  while standing  and cannot march in place     Is the above level different from baseline mobility prior to current illness? Yes - Recommend PT order -- Yes - Recommend PT order               Objective: Vitals last 24 hrs: Vitals:   04/28/22 0800 04/28/22 1522 04/28/22 2020 04/29/22 0504  BP: 105/62 (!) 146/105 101/64 (!) 91/49  Pulse: 74 78 81 72  Resp:  18 (!) 21 17  Temp:  99.1 F (37.3 C) 98.1 F (36.7 C) 98.3 F (36.8 C)  TempSrc:  Oral Oral   SpO2:  99% 99% 94%  Weight:      Height:       Weight change:   Physical Examination: General exam: AA oriented to self place,older than stated age, weak appearing. HEENT:Oral mucosa moist, Ear/Nose WNL grossly, dentition normal. Respiratory system: bilaterally diminished,no use of accessory muscle Cardiovascular system: S1 & S2 +, No JVD,. Gastrointestinal system: Abdomen soft,NT,ND, BS+ Nervous System:Alert, awake, moving extremities and grossly nonfocal Extremities: edema neg,distal peripheral pulses palpable.  Skin: No rashes,no icterus. MSK: Normal muscle bulk,tone, power   Medications reviewed:  Scheduled Meds:  amoxicillin-clavulanate  1 tablet Oral Q12H   vitamin C  500 mg Oral BID   feeding supplement  237 mL Oral TID BM   ferrous sulfate  325 mg Oral BID WC   multivitamin with minerals  1 tablet Oral Daily   thiamine (VITAMIN B1) injection  100 mg Intravenous Q8H   Continuous Infusions:  folic acid 1 mg in sodium chloride 0.9 % 50 mL IVPB 1 mg (04/28/22 8546)   Diet Order             Diet regular Room service appropriate? Yes; Fluid consistency: Thin  Diet effective now                  Intake/Output Summary (Last 24 hours) at 04/29/2022 0847 Last data filed at 04/29/2022 0443 Gross per 24 hour  Intake 668.32 ml  Output 900 ml  Net -231.68 ml   Net IO Since Admission: 4,202.81 mL [04/29/22 0847]  Wt Readings from Last 3 Encounters:  04/25/22 83.9 kg  10/24/21 79.4 kg  09/20/21 79.4 kg      Unresulted Labs (From admission, onward)     Start     Ordered   04/27/22 0500  CBC  Daily at 5am,   R      04/26/22 0732   04/27/22 0500  Basic metabolic panel  Daily at 5am,   R      04/26/22 0732          Data Reviewed: I have personally reviewed following labs and imaging studies CBC: Recent Labs  Lab 04/25/22 1456 04/25/22 1536 04/26/22 0100 04/26/22 1000 04/26/22 1741 04/27/22 0103 04/28/22 0358 04/29/22 0422  WBC 13.0*  --  10.5  --   --  9.8 8.0 7.0  NEUTROABS 10.9*  --   --   --   --   --   --   --   HGB 9.6*   < > 7.4* 9.1* 7.9* 7.3* 7.5* 7.7*  HCT 26.7*   < > 20.6* 26.6* 22.7* 21.6* 22.3* 22.8*  MCV 94.0  --  94.9  --   --  98.2 99.1 101.3*  PLT 181  --  137*  --   --  132* 129* 118*   < > = values in this interval not displayed.   Basic Metabolic Panel: Recent Labs  Lab 04/25/22 1456 04/25/22 1536 04/26/22 0100 04/26/22 0436 04/26/22 1000 04/26/22 1530 04/27/22 0103 04/28/22 0358 04/29/22 0422  NA 126*   < > 130*  --  130*  --  131* 132* 133*  K 2.7*   < > 3.0*  --  3.9 3.4* 3.6 3.6 3.6  CL 79*   < > 90*  --  92*  --  98 100 104  CO2 29  --  30  --  26  --  26 23 22   GLUCOSE 121*   < > 88  --  72  --  99 100* 97  BUN 15   < > 12  --  10  --  7 5* 9  CREATININE 0.66   < > 0.56*  --  0.38*  --  0.52* 0.58* 0.55*  CALCIUM 9.3  --  8.4*  --  8.4*  --  8.2* 8.3* 8.1*  MG 2.1  --   --  2.0  --   --   --  1.4* 1.6*  PHOS  --   --   --   --   --   --   --  2.4*  --    < > = values in this interval not displayed.   GFR: Estimated Creatinine Clearance: 114.5 mL/min (A) (by C-G formula based on SCr of 0.55 mg/dL (L)). Liver Function Tests: Recent Labs  Lab 04/25/22 1456 04/26/22 0100 04/28/22 0358  AST 93* 69* 81*  ALT 67* 50* 47*  ALKPHOS 175* 132* 136*  BILITOT 4.0* 2.7* 1.3*  PROT 8.9* 6.9 6.1*  ALBUMIN 3.1* 2.4* 2.1*   Recent Labs  Lab 04/25/22 1456  LIPASE 38   Recent Labs  Lab 04/25/22 1708 04/26/22 0436  AMMONIA 22 19    Coagulation Profile: Recent Labs  Lab 04/25/22 1456  INR 1.0   BNP (last 3 results) No results for input(s): "PROBNP" in the last 8760 hours. HbA1C: No results for input(s): "HGBA1C" in the last 72 hours. CBG: Recent Labs  Lab 04/26/22 0433  GLUCAP 76   Lipid Profile: No results for input(s): "CHOL", "HDL", "LDLCALC", "TRIG", "CHOLHDL", "LDLDIRECT" in the last 72 hours. Thyroid Function Tests: No results for input(s): "TSH", "T4TOTAL", "FREET4", "T3FREE", "THYROIDAB" in the last 72 hours. Sepsis Labs: Recent Labs  Lab 04/25/22 1456 04/25/22 1708 04/26/22 1530  LATICACIDVEN 2.2* 2.0* 1.0    Recent Results (from the past 240 hour(s))  Blood Culture (routine x 2)     Status: None (Preliminary result)   Collection Time: 04/25/22  2:44 PM   Specimen: Site Not Specified; Blood  Result Value Ref Range Status   Specimen Description   Final    SITE NOT SPECIFIED Performed at [facility], [address] [place], [place]    Special Requests   Final    BOTTLES DRAWN AEROBIC AND ANAEROBIC Blood Culture adequate volume  Performed at [facility], [place] [address], [place], [place] [address]    Culture   Final    NO GROWTH 3 DAYS Performed at [facility], [place] [address], [place], [place] [address]    Report Status PENDING  Incomplete  Blood Culture (routine x 2)     Status: None (Preliminary result)   Collection Time: 04/25/22  2:56 PM   Specimen: BLOOD LEFT HAND  Result Value Ref Range Status   Specimen Description   Final    BLOOD LEFT HAND Performed at [place] [address], [place], [place] [address]    Special Requests   Final    BOTTLES DRAWN AEROBIC ONLY Blood Culture results may not be optimal due to an inadequate volume of blood received in culture bottles Performed at [place] [address], [place], [place] [address]    Culture   Final    NO GROWTH 3  DAYS Performed at [facility], [place] [address], [place], [place] [address]    Report Status PENDING  Incomplete  SARS Coronavirus 2 by RT PCR (hospital order, performed in [facility] hospital lab) *cepheid single result test* Anterior Nasal Swab     Status: None   Collection Time: 04/25/22  5:04 PM   Specimen: Anterior Nasal Swab  Result Value Ref Range Status   SARS Coronavirus 2 by RT PCR NEGATIVE NEGATIVE Final    Comment: (NOTE) SARS-CoV-2 target nucleic acids are NOT DETECTED.  The SARS-CoV-2 RNA is generally detectable in upper and lower respiratory specimens during the acute phase of infection. The lowest concentration of SARS-CoV-2 viral copies this assay can detect is 250 copies / mL. A negative result does not preclude SARS-CoV-2 infection and should not be used as the sole basis for treatment or other patient management decisions.  A negative result may occur with improper specimen collection / handling, submission of specimen other than nasopharyngeal swab, presence of viral mutation(s) within the areas targeted by this assay, and inadequate number of viral copies (<250 copies / mL). A negative result must be combined with clinical observations, patient history, and epidemiological information.  Fact Sheet for Patients:   https://www.patel.info/  Fact Sheet for Healthcare Providers: https://hall.com/  This test is not yet approved or  cleared by the [place] FDA and has been authorized for detection and/or diagnosis of SARS-CoV-2 by FDA under an Emergency Use Authorization (EUA).  This EUA will remain in effect (meaning this test can be used) for the duration of the COVID-19 declaration under Section 564(b)(1) of the Act, 21 U.S.C. section 360bbb-3(b)(1), unless the authorization is terminated or revoked sooner.  Performed at [facility], [place] [address], [place], [place] [address]   C  Difficile Quick Screen w PCR reflex     Status: None   Collection Time: 04/26/22  6:00 PM   Specimen: STOOL  Result Value Ref Range Status   C Diff antigen NEGATIVE NEGATIVE Final   C Diff toxin NEGATIVE NEGATIVE Final   C Diff interpretation No C. difficile detected.  Final    Comment: Performed at [facility], [place] [address] [place], [place] [address]  Gastrointestinal Panel by PCR , Stool     Status: Abnormal   Collection Time: 04/26/22  6:00 PM   Specimen: STOOL  Result Value Ref Range Status   Campylobacter species NOT DETECTED NOT DETECTED Final   Plesimonas shigelloides NOT DETECTED NOT DETECTED  Final   Salmonella species NOT DETECTED NOT DETECTED Final   Yersinia enterocolitica NOT DETECTED NOT DETECTED Final   Vibrio species NOT DETECTED NOT DETECTED Final   Vibrio cholerae NOT DETECTED NOT DETECTED Final   Enteroaggregative E coli (EAEC) NOT DETECTED NOT DETECTED Final   Enteropathogenic E coli (EPEC) DETECTED (A) NOT DETECTED Final    Comment: RESULT CALLED TO, READ BACK BY AND VERIFIED WITH: DERRICK RICHTER 04/27/22 1028 KLW    Enterotoxigenic E coli (ETEC) NOT DETECTED NOT DETECTED Final   Shiga like toxin producing E coli (STEC) NOT DETECTED NOT DETECTED Final   Shigella/Enteroinvasive E coli (EIEC) NOT DETECTED NOT DETECTED Final   Cryptosporidium NOT DETECTED NOT DETECTED Final   Cyclospora cayetanensis NOT DETECTED NOT DETECTED Final   Entamoeba histolytica NOT DETECTED NOT DETECTED Final   Giardia lamblia NOT DETECTED NOT DETECTED Final   Adenovirus F40/41 NOT DETECTED NOT DETECTED Final   Astrovirus NOT DETECTED NOT DETECTED Final   Norovirus GI/GII NOT DETECTED NOT DETECTED Final   Rotavirus A NOT DETECTED NOT DETECTED Final   Sapovirus (I, II, IV, and V) NOT DETECTED NOT DETECTED Final    Comment: Performed at [facility], [place] [place], [place] [address]    Antimicrobials: Anti-infectives (From admission, onward)     Start     Dose/Rate Route Frequency Ordered Stop   04/28/22 1400  amoxicillin-clavulanate (AUGMENTIN) 875-125 MG per tablet 1 tablet        1 tablet Oral Every 12 hours 04/28/22 1139 05/01/22 0959   04/25/22 1800  Ampicillin-Sulbactam (UNASYN) 3 g in sodium chloride 0.9 % 100 mL IVPB  Status:  Discontinued        3 g 200 mL/hr over 30 Minutes Intravenous Every 6 hours 04/25/22 1748 04/28/22 1139      Culture/Microbiology    Component Value Date/Time   SDES  04/25/2022 1456    BLOOD LEFT HAND Performed at [facility], [place] [address], [place], [place] [address]    SPECREQUEST  04/25/2022 1456    BOTTLES DRAWN AEROBIC ONLY Blood Culture results may not be optimal due to an inadequate volume of blood received in culture bottles Performed at [facility], [place] [address], [place], [place] [address]    CULT  04/25/2022 1456    NO GROWTH 3 DAYS Performed at [place] [address], [place],  [address]    REPTSTATUS PENDING 04/25/2022 1456  Other culture-see note  Radiology Studies: No results found.   LOS: 4 days   Antonieta Pert, MD [facility]  04/29/2022, 8:47 AM

## 2022-04-30 DIAGNOSIS — E871 Hypo-osmolality and hyponatremia: Secondary | ICD-10-CM | POA: Diagnosis not present

## 2022-04-30 LAB — CBC
HCT: 24.1 % — ABNORMAL LOW (ref 39.0–52.0)
Hemoglobin: 8 g/dL — ABNORMAL LOW (ref 13.0–17.0)
MCH: 33.8 pg (ref 26.0–34.0)
MCHC: 33.2 g/dL (ref 30.0–36.0)
MCV: 101.7 fL — ABNORMAL HIGH (ref 80.0–100.0)
Platelets: 129 10*3/uL — ABNORMAL LOW (ref 150–400)
RBC: 2.37 MIL/uL — ABNORMAL LOW (ref 4.22–5.81)
RDW: 16.8 % — ABNORMAL HIGH (ref 11.5–15.5)
WBC: 6.9 10*3/uL (ref 4.0–10.5)
nRBC: 0 % (ref 0.0–0.2)

## 2022-04-30 LAB — MAGNESIUM: Magnesium: 1.8 mg/dL (ref 1.7–2.4)

## 2022-04-30 LAB — BASIC METABOLIC PANEL
Anion gap: 8 (ref 5–15)
BUN: 9 mg/dL (ref 6–20)
CO2: 23 mmol/L (ref 22–32)
Calcium: 8.2 mg/dL — ABNORMAL LOW (ref 8.9–10.3)
Chloride: 102 mmol/L (ref 98–111)
Creatinine, Ser: 0.59 mg/dL — ABNORMAL LOW (ref 0.61–1.24)
GFR, Estimated: >60 mL/min (ref >60)
Glucose, Bld: 94 mg/dL (ref 70–99)
Potassium: 3.3 mmol/L — ABNORMAL LOW (ref 3.5–5.1)
Sodium: 133 mmol/L — ABNORMAL LOW (ref 135–145)

## 2022-04-30 LAB — CULTURE, BLOOD (ROUTINE X 2)
Culture: NO GROWTH
Culture: NO GROWTH
Special Requests: ADEQUATE

## 2022-04-30 MED ORDER — POTASSIUM CHLORIDE CRYS ER 20 MEQ PO TBCR
40.0000 meq | EXTENDED_RELEASE_TABLET | Freq: Once | ORAL | Status: AC
Start: 2022-04-30 — End: 2022-04-30
  Administered 2022-04-30: 40 meq via ORAL
  Filled 2022-04-30: qty 2

## 2022-04-30 MED ORDER — MAGNESIUM SULFATE 2 GM/50ML IV SOLN
2.0000 g | Freq: Once | INTRAVENOUS | Status: AC
  Administered 2022-04-30: 2 g via INTRAVENOUS
  Filled 2022-04-30: qty 50

## 2022-04-30 NOTE — Progress Notes (Signed)
Patient refused mobility, reported feeling depressed, and "not in the mood."  Chaplain consulted.  Bradd Burner, RN

## 2022-04-30 NOTE — Progress Notes (Signed)
PROGRESS NOTE Dylan Weaver  YHC:[id number] DOB: 13-Oct-1965 DOA: 04/25/2022 PCP: [name]   Brief Narrative/Hospital Course: 56 y.o.m w/ history of alcohol abuse, previous history of alcoholic hepatitis brought into the ED by EMS after neighbor found the patient lying on the floor covered in feces and surrounded by empty beer cans.  Patient apparently reported to EMS that he fell 2 days prior to presentation. He was confused and history is very limited.  He had diarrhea and GI panel came back positive for enteropathogenic E. coli and resolved without treatment he had hypovolemic hyponatremia dehydration that resolved with IV fluids.  His encephalopathy has significantly improved.  He remains weak and deconditioned PT OT recommend skilled nursing facility placement    Subjective: No complaints, BM last PM   Assessment and Plan: Principal Problem:   Hyponatremia Active Problems:   Alcohol use disorder with alcohol withdrawal    Thrombocytopenia (HCC)   Normochromic normocytic anemia   Elevated LFTs   Prolonged QT interval   Pressure ulcer   Acute metabolic encephalopathy   Hypokalemia   Aspiration pneumonia (HCC)   Enteropathogenic Escherichia coli infection   Hypophosphatemia    Enteropathogenic E. coli diarrhea: diarrhea resolved.   -Continue enteric precaution  -continue oral diet  Hypovolemic hyponatremia Dehydration/ketosis in urine: Resolved  Hypokalemia/hypomagnesemia hypophosphatemia: K improved for slightly low mag  -Replete mag IV  Aspiration pneumonia with CT showing-left-sided groundglass opacities  -cxr neg. -Likely aspirated. -changed unasyn to augmentin for 3 more days on 7.28  Transaminitis/abnormal lfts:  -likely etoh related.likely secondary to his alcohol abuse. CT scan showed cholelithiasis without evidence of cholecystitis.Hepatic steatosis noted.  Acute hepatitis panel negative.  AST/ALT 69/50 T. bili2.7 > downtrending.    -Trend   Acute metabolic encephalopathy:  likely due to metabolic derangements/alcoholism/ aspiration.non focal on exam. CT head- non acute-ammonia level normal.  Due to his alcohol abuse history started on high-dose thiamine,ciwa ativan, etoh cessation counseling.  Mental status has improved.  PTOT eval-advised skilled nursing facility  Alcohol abuse with alcohol withdrawal: Last alcohol use unknown patient reports 2 days PTA, drinks at least 12 beers on daily basis, possibly liquid.  Continue high-dose thiamine, folic acid, CIWA scale Ativan prn. TOC consult for cessation.  Mildly elevated lactic acid likely from dehydration related decreased clearance due to his hepatic derangement steatosis.  Resolved  Old healed left rib fractures/old clavicle fracture/chronic L1 compression fracture : Respiratory status is stable, no chest pain.  Continue incentive spirometry, pain control.    Normocytic anemia Folic acid deficiency Thrombocytopenia: Suspect multifactorial hemodilution, anemia of chronic disease.  No active bleeding noted Hemoccult was negative.B12 is normal, iron normal 53 ferritin high 913.Last colonoscopy couple of years ago he says and was " okay" per him. Patient will need extensive follow-up with GI PCP and age-appropriate cancer screening as outpatient. Added po iron  Cholelithiasis: Noted incidentally on CT scan -No cholecystitis. Monitor.  QT prolongation: Likely from electrolyte imbalance repleting electrolytes aggressively.monitor in tele.   Deconditioning/debility obtain PT OT eval, encourage ambulation  Pressure ulcer POA on the back stage II on the left shoulder DTI Pressure Injury 04/26/22 Back Mid Stage 2 -  Partial thickness loss of dermis presenting as a shallow open injury with a red, pink wound bed without slough. (Active)  04/26/22 1517  Location: Back  Location Orientation: Mid  Staging: Stage 2 -  Partial thickness loss of dermis presenting as a shallow open  injury with a red, pink wound bed without slough.  Wound Description (Comments):   Present on Admission: Yes     Pressure Injury 04/27/22 Shoulder Left;Posterior Deep Tissue Pressure Injury - Purple or maroon localized area of discolored intact skin or blood-filled blister due to damage of underlying soft tissue from pressure and/or shear. (Active)  04/27/22   Location: Shoulder  Location Orientation: Left;Posterior  Staging: Deep Tissue Pressure Injury - Purple or maroon localized area of discolored intact skin or blood-filled blister due to damage of underlying soft tissue from pressure and/or shear.  Wound Description (Comments):   Present on Admission: Yes     DVT prophylaxis: SCDs Start: 04/25/22 1729.  No chemical prophylaxis due to anemia/ and drop in hemoglobin  Code Status:   Code Status: Full Code   Patient status is: Inpatient because of ongoing management of anemia, deconditioning and debility pending PT OT evaluation   Level of care: Med-Surg  Dispo: The patient is from: home            Anticipated disposition: SNF once available.  Medically stable    Objective: Vitals last 24 hrs: Vitals:   04/29/22 1236 04/29/22 2137 04/30/22 0030 04/30/22 0535  BP: 107/79 (!) 83/53 92/65 104/69  Pulse: 83 77 80 80  Resp: 19     Temp: 98.1 F (36.7 C) 98 F (36.7 C)  98.4 F (36.9 C)  TempSrc: Oral Oral  Oral  SpO2: 100% 96%  99%  Weight:      Height:       Weight change:   Physical Examination:  General: Appearance:     Overweight male in no acute distress     Lungs:     Clear to auscultation bilaterally, respirations unlabored  Heart:    Normal heart rate.   MS:   All extremities are intact.   Neurologic:   Awake, alert      Medications reviewed:  Scheduled Meds:  amoxicillin-clavulanate  1 tablet Oral Q12H   vitamin C  500 mg Oral BID   feeding supplement  237 mL Oral TID BM   ferrous sulfate  325 mg Oral BID WC   folic acid  1 mg Oral Daily    multivitamin with minerals  1 tablet Oral Daily   phosphorus  250 mg Oral TID   potassium chloride  40 mEq Oral Once   thiamine  100 mg Oral Daily   Continuous Infusions:   Diet Order             Diet regular Room service appropriate? Yes; Fluid consistency: Thin  Diet effective now                  Intake/Output Summary (Last 24 hours) at 04/30/2022 1149 Last data filed at 04/30/2022 0900 Gross per 24 hour  Intake 720 ml  Output 2400 ml  Net -1680 ml   Net IO Since Admission: 2,761.81 mL [04/30/22 1149]  Wt Readings from Last 3 Encounters:  04/25/22 83.9 kg  10/24/21 79.4 kg  09/20/21 79.4 kg     Unresulted Labs (From admission, onward)     Start     Ordered   04/27/22 0500  CBC  Daily at 5am,   R      04/26/22 0732   04/27/22 [id number]  Basic metabolic panel  Daily at 5am,   R      04/26/22 0732          Data Reviewed: I have personally reviewed following labs and imaging studies CBC: Recent Labs  Lab 04/25/22 1456 04/25/22 1536 04/26/22 0100 04/26/22 1000 04/26/22 1741 04/27/22 0103 04/28/22 0358 04/29/22 0422 04/30/22 0346  WBC 13.0*  --  10.5  --   --  9.8 8.0 7.0 6.9  NEUTROABS 10.9*  --   --   --   --   --   --   --   --   HGB 9.6*   < > 7.4*   < > 7.9* 7.3* 7.5* 7.7* 8.0*  HCT 26.7*   < > 20.6*   < > 22.7* 21.6* 22.3* 22.8* 24.1*  MCV 94.0  --  94.9  --   --  98.2 99.1 101.3* 101.7*  PLT 181  --  137*  --   --  132* 129* 118* 129*   < > = values in this interval not displayed.   Basic Metabolic Panel: Recent Labs  Lab 04/25/22 1456 04/25/22 1536 04/26/22 0436 04/26/22 1000 04/26/22 1530 04/27/22 0103 04/28/22 0358 04/29/22 0422 04/30/22 0346  NA 126*   < >  --  130*  --  131* 132* 133* 133*  K 2.7*   < >  --  3.9 3.4* 3.6 3.6 3.6 3.3*  CL 79*   < >  --  92*  --  98 100 104 102  CO2 29   < >  --  26  --  26 23 22 23   GLUCOSE 121*   < >  --  72  --  99 100* 97 94  BUN 15   < >  --  10  --  7 5* 9 9  CREATININE 0.66   < >  --  0.38*   --  0.52* 0.58* 0.55* 0.59*  CALCIUM 9.3   < >  --  8.4*  --  8.2* 8.3* 8.1* 8.2*  MG 2.1  --  2.0  --   --   --  1.4* 1.6* 1.8  PHOS  --   --   --   --   --   --  2.4*  --   --    < > = values in this interval not displayed.   GFR: Estimated Creatinine Clearance: 114.5 mL/min (A) (by C-G formula based on SCr of 0.59 mg/dL (L)). Liver Function Tests: Recent Labs  Lab 04/25/22 1456 04/26/22 0100 04/28/22 0358  AST 93* 69* 81*  ALT 67* 50* 47*  ALKPHOS 175* 132* 136*  BILITOT 4.0* 2.7* 1.3*  PROT 8.9* 6.9 6.1*  ALBUMIN 3.1* 2.4* 2.1*   Recent Labs  Lab 04/25/22 1456  LIPASE 38   Recent Labs  Lab 04/25/22 1708 04/26/22 0436  AMMONIA 22 19   Coagulation Profile: Recent Labs  Lab 04/25/22 1456  INR 1.0   BNP (last 3 results) No results for input(s): "PROBNP" in the last 8760 hours. HbA1C: No results for input(s): "HGBA1C" in the last 72 hours. CBG: Recent Labs  Lab 04/26/22 0433  GLUCAP 76   Lipid Profile: No results for input(s): "CHOL", "HDL", "LDLCALC", "TRIG", "CHOLHDL", "LDLDIRECT" in the last 72 hours. Thyroid Function Tests: No results for input(s): "TSH", "T4TOTAL", "FREET4", "T3FREE", "THYROIDAB" in the last 72 hours. Sepsis Labs: Recent Labs  Lab 04/25/22 1456 04/25/22 1708 04/26/22 1530  LATICACIDVEN 2.2* 2.0* 1.0    Recent Results (from the past 240 hour(s))  Blood Culture (routine x 2)     Status: None   Collection Time: 04/25/22  2:44 PM   Specimen: Site Not Specified; Blood  Result Value Ref  Range Status   Specimen Description   Final    SITE NOT SPECIFIED Performed at [facility] [address], [place], [place] [address]    Special Requests   Final    BOTTLES DRAWN AEROBIC AND ANAEROBIC Blood Culture adequate volume Performed at [facility] [address] [place], [place] [address]    Culture   Final    NO GROWTH 5 DAYS Performed at [facility], [place] [address], [place],  [place] [address]    Report Status 04/30/2022 FINAL  Final  Blood Culture (routine x 2)     Status: None   Collection Time: 04/25/22  2:56 PM   Specimen: BLOOD LEFT HAND  Result Value Ref Range Status   Specimen Description   Final    BLOOD LEFT HAND Performed at [facility] [address], [place], [place] [address]    Special Requests   Final    BOTTLES DRAWN AEROBIC ONLY Blood Culture results may not be optimal due to an inadequate volume of blood received in culture bottles Performed at [facility] [address], [place], [place] [address]    Culture   Final    NO GROWTH 5 DAYS Performed at [facility], [place] [address], [place], [place] [address]    Report Status 04/30/2022 FINAL  Final  SARS Coronavirus 2 by RT PCR (hospital order, performed in [facility] hospital lab) *cepheid single result test* Anterior Nasal Swab     Status: None   Collection Time: 04/25/22  5:04 PM   Specimen: Anterior Nasal Swab  Result Value Ref Range Status   SARS Coronavirus 2 by RT PCR NEGATIVE NEGATIVE Final    Comment: (NOTE) SARS-CoV-2 target nucleic acids are NOT DETECTED.  The SARS-CoV-2 RNA is generally detectable in upper and lower respiratory specimens during the acute phase of infection. The lowest concentration of SARS-CoV-2 viral copies this assay can detect is 250 copies / mL. A negative result does not preclude SARS-CoV-2 infection and should not be used as the sole basis for treatment or other patient management decisions.  A negative result may occur with improper specimen collection / handling, submission of specimen other than nasopharyngeal swab, presence of viral mutation(s) within the areas targeted by this assay, and inadequate number of viral copies (<250 copies / mL). A negative result must be combined with clinical observations, patient history, and epidemiological information.  Fact Sheet for Patients:    https://www.patel.info/  Fact Sheet for Healthcare Providers: https://hall.com/  This test is not yet approved or  cleared by the [place] FDA and has been authorized for detection and/or diagnosis of SARS-CoV-2 by FDA under an Emergency Use Authorization (EUA).  This EUA will remain in effect (meaning this test can be used) for the duration of the COVID-19 declaration under Section 564(b)(1) of the Act, 21 U.S.C. section 360bbb-3(b)(1), unless the authorization is terminated or revoked sooner.  Performed at [facility], [place] [address], [place], [place] [address]   C Difficile Quick Screen w PCR reflex     Status: None   Collection Time: 04/26/22  6:00 PM   Specimen: STOOL  Result Value Ref Range Status   C Diff antigen NEGATIVE NEGATIVE Final   C Diff toxin NEGATIVE NEGATIVE Final   C Diff interpretation No C. difficile detected.  Final    Comment: Performed at [facility], [place] [address] [place],  [address]  Gastrointestinal Panel by PCR , Stool     Status: Abnormal   Collection Time: 04/26/22  6:00 PM   Specimen: STOOL  Result Value Ref Range Status   Campylobacter species NOT DETECTED NOT DETECTED Final   Plesimonas shigelloides NOT DETECTED NOT DETECTED Final   Salmonella species NOT DETECTED NOT DETECTED Final   Yersinia enterocolitica NOT DETECTED NOT DETECTED Final   Vibrio species NOT DETECTED NOT DETECTED Final   Vibrio cholerae NOT DETECTED NOT DETECTED Final   Enteroaggregative E coli (EAEC) NOT DETECTED NOT DETECTED Final   Enteropathogenic E coli (EPEC) DETECTED (A) NOT DETECTED Final    Comment: RESULT CALLED TO, READ BACK BY AND VERIFIED WITH: DERRICK RICHTER 04/27/22 1028 KLW    Enterotoxigenic E coli (ETEC) NOT DETECTED NOT DETECTED Final   Shiga like toxin producing E coli (STEC) NOT DETECTED NOT DETECTED Final   Shigella/Enteroinvasive E coli (EIEC) NOT DETECTED  NOT DETECTED Final   Cryptosporidium NOT DETECTED NOT DETECTED Final   Cyclospora cayetanensis NOT DETECTED NOT DETECTED Final   Entamoeba histolytica NOT DETECTED NOT DETECTED Final   Giardia lamblia NOT DETECTED NOT DETECTED Final   Adenovirus F40/41 NOT DETECTED NOT DETECTED Final   Astrovirus NOT DETECTED NOT DETECTED Final   Norovirus GI/GII NOT DETECTED NOT DETECTED Final   Rotavirus A NOT DETECTED NOT DETECTED Final   Sapovirus (I, II, IV, and V) NOT DETECTED NOT DETECTED Final    Comment: Performed at [facility], [address] [place], [place] [address]    Antimicrobials: Anti-infectives (From admission, onward)    Start     Dose/Rate Route Frequency Ordered Stop   04/28/22 1400  amoxicillin-clavulanate (AUGMENTIN) 875-125 MG per tablet 1 tablet        1 tablet Oral Every 12 hours 04/28/22 1139 05/01/22 0959   04/25/22 1800  Ampicillin-Sulbactam (UNASYN) 3 g in sodium chloride 0.9 % 100 mL IVPB  Status:  Discontinued        3 g 200 mL/hr over 30 Minutes Intravenous Every 6 hours 04/25/22 1748 04/28/22 1139      Culture/Microbiology    Component Value Date/Time   SDES  04/25/2022 1456    BLOOD LEFT HAND Performed at [facility], [address], [place], [place] [address]    SPECREQUEST  04/25/2022 1456    BOTTLES DRAWN AEROBIC ONLY Blood Culture results may not be optimal due to an inadequate volume of blood received in culture bottles Performed at [facility], [address] [place], [place] [address]    CULT  04/25/2022 1456    NO GROWTH 5 DAYS Performed at [facility], [address], [place], [place] [address]    REPTSTATUS 04/30/2022 FINAL 04/25/2022 1456  Other culture-see note  Radiology Studies: No results found.   LOS: 5 days   Joseph Art, DO [facility]  04/30/2022, 11:49 AM

## 2022-04-30 NOTE — Progress Notes (Signed)
   04/30/22 1800  Clinical Encounter Type  Visited With Patient  Visit Type Initial;Psychological support;Spiritual support;Social support  Referral From Nurse  Consult/Referral To Chaplain  Recommendations Assess spiritual distress  Stress Factors  Patient Stress Factors Family relationships;Health changes;Loss;Loss of control  Family Stress Factors None identified   Chaplain met with patient at befdside who was sitting up and ordering dinner menu.  Requested spiritual care visit  "to come and say the Lords Prayer with me." Spoke of his loneliness since the death of his father, his mother and his divorce three years ago.  Provided counseling and comfort for possible spiritual distress Stated "I just don't know what I  am doing and what my purpose is in life now."  Denied drinking problem and said he loved his first beer at age 83 and loved it ever since.  He stated he only stopped drinking when he was in the miliary or in the hospital.  He spoke at length of hwere he feels tranquil; and safe and calm is when he spent years going to [employer] and would surf fish with his family.  Chaplain encouraged him to remember that location and when he feels anxiety or stress or loneliness - imagine himself on the surf - fishing at [facility].   Provided literature and Scripture for him to read when lonely and provided counsel with ending prayer at bedside with anointing of oil.

## 2022-05-01 DIAGNOSIS — E871 Hypo-osmolality and hyponatremia: Secondary | ICD-10-CM | POA: Diagnosis not present

## 2022-05-01 LAB — CBC
HCT: 23.1 % — ABNORMAL LOW (ref 39.0–52.0)
Hemoglobin: 7.6 g/dL — ABNORMAL LOW (ref 13.0–17.0)
MCH: 33.3 pg (ref 26.0–34.0)
MCHC: 32.9 g/dL (ref 30.0–36.0)
MCV: 101.3 fL — ABNORMAL HIGH (ref 80.0–100.0)
Platelets: 149 10*3/uL — ABNORMAL LOW (ref 150–400)
RBC: 2.28 MIL/uL — ABNORMAL LOW (ref 4.22–5.81)
RDW: 17.2 % — ABNORMAL HIGH (ref 11.5–15.5)
WBC: 8.2 10*3/uL (ref 4.0–10.5)
nRBC: 0 % (ref 0.0–0.2)

## 2022-05-01 LAB — BASIC METABOLIC PANEL
Anion gap: 7 (ref 5–15)
BUN: 8 mg/dL (ref 6–20)
CO2: 20 mmol/L — ABNORMAL LOW (ref 22–32)
Calcium: 8.1 mg/dL — ABNORMAL LOW (ref 8.9–10.3)
Chloride: 104 mmol/L (ref 98–111)
Creatinine, Ser: 0.44 mg/dL — ABNORMAL LOW (ref 0.61–1.24)
GFR, Estimated: >60 mL/min (ref >60)
Glucose, Bld: 99 mg/dL (ref 70–99)
Potassium: 3.7 mmol/L (ref 3.5–5.1)
Sodium: 131 mmol/L — ABNORMAL LOW (ref 135–145)

## 2022-05-01 NOTE — Progress Notes (Signed)
PROGRESS NOTE Dylan Weaver  [id number] DOB: 12-14-1965 DOA: 04/25/2022 PCP: [facility]   Brief Narrative/Hospital Course: 56 y.o.m w/ history of alcohol abuse, previous history of alcoholic hepatitis brought into the ED by EMS after neighbor found the patient lying on the floor covered in feces and surrounded by empty beer cans.  Patient apparently reported to EMS that he fell 2 days prior to presentation. He was confused and history is very limited.  He had diarrhea and GI panel came back positive for enteropathogenic E. coli and resolved without treatment he had hypovolemic hyponatremia dehydration that resolved with IV fluids.  His encephalopathy has significantly improved.  He remains weak and deconditioned PT OT recommend skilled nursing facility placement    Subjective: Hungry this AM-- asking about breakfast   Assessment and Plan: Principal Problem:   Hyponatremia Active Problems:   Alcohol use disorder with alcohol withdrawal    Thrombocytopenia (HCC)   Normochromic normocytic anemia   Elevated LFTs   Prolonged QT interval   Pressure ulcer   Acute metabolic encephalopathy   Hypokalemia   Aspiration pneumonia (HCC)   Enteropathogenic Escherichia coli infection   Hypophosphatemia    Enteropathogenic E. coli diarrhea: diarrhea resolved.   -Continue enteric precaution  -continue oral diet  Hypovolemic hyponatremia Dehydration/ketosis in urine: Resolved  Hypokalemia/hypomagnesemia hypophosphatemia: K improved for slightly low mag  -Replete mag IV  Aspiration pneumonia with CT showing-left-sided groundglass opacities  -cxr neg. -Likely aspirated. -changed unasyn to augmentin for 3 more days on 7.28- now abx have finished  Transaminitis/abnormal lfts:  -likely etoh related.likely secondary to his alcohol abuse. CT scan showed cholelithiasis without evidence of cholecystitis.Hepatic steatosis noted.  Acute hepatitis panel negative.  AST/ALT  69/50 T. bili2.7 > downtrending.    Acute metabolic encephalopathy:  likely due to metabolic derangements/alcoholism/ aspiration.non focal on exam. CT head- non acute-ammonia level normal.  Due to his alcohol abuse history started on high-dose thiamine,ciwa ativan, etoh cessation counseling.  Mental status has improved.  PTOT eval-advised skilled nursing facility  Alcohol abuse with alcohol withdrawal: Last alcohol use unknown patient reports 2 days PTA, drinks at least 12 beers on daily basis, possibly liquid.  Continue high-dose thiamine, folic acid, CIWA scale Ativan prn. TOC consult for cessation.  Mildly elevated lactic acid likely from dehydration related decreased clearance due to his hepatic derangement steatosis.  Resolved  Old healed left rib fractures/old clavicle fracture/chronic L1 compression fracture : Respiratory status is stable, no chest pain.  Continue incentive spirometry, pain control.    Normocytic anemia Folic acid deficiency Thrombocytopenia: Suspect multifactorial hemodilution, anemia of chronic disease.  No active bleeding noted Hemoccult was negative.B12 is normal, iron normal 53 ferritin high 913.Last colonoscopy couple of years ago he says and was " okay" per him. Patient will need extensive follow-up with GI PCP and age-appropriate cancer screening as outpatient.  - po iron  Cholelithiasis: Noted incidentally on CT scan -No cholecystitis. Monitor.  QT prolongation: Likely from electrolyte imbalance repleting electrolytes aggressively.monitor in tele.   Deconditioning/debility obtain PT OT eval, encourage ambulation  Pressure ulcer POA on the back stage II on the left shoulder DTI Pressure Injury 04/26/22 Back Mid Stage 2 -  Partial thickness loss of dermis presenting as a shallow open injury with a red, pink wound bed without slough. (Active)  04/26/22 1517  Location: Back  Location Orientation: Mid  Staging: Stage 2 -  Partial thickness loss of dermis  presenting as a shallow open injury with a red, pink  wound bed without slough.  Wound Description (Comments):   Present on Admission: Yes     Pressure Injury 04/27/22 Shoulder Left;Posterior Deep Tissue Pressure Injury - Purple or maroon localized area of discolored intact skin or blood-filled blister due to damage of underlying soft tissue from pressure and/or shear. (Active)  04/27/22   Location: Shoulder  Location Orientation: Left;Posterior  Staging: Deep Tissue Pressure Injury - Purple or maroon localized area of discolored intact skin or blood-filled blister due to damage of underlying soft tissue from pressure and/or shear.  Wound Description (Comments):   Present on Admission: Yes     DVT prophylaxis: SCDs Start: 04/25/22 1729.  No chemical prophylaxis due to anemia/ and drop in hemoglobin  Code Status:   Code Status: Full Code   Patient status is: Inpatient because of ongoing management of anemia, deconditioning and debility pending PT OT evaluation   Level of care: Med-Surg  Dispo: The patient is from: home            Anticipated disposition: SNF once available.  Medically stable    Objective: Vitals last 24 hrs: Vitals:   04/30/22 0535 04/30/22 1342 04/30/22 2020 05/01/22 0349  BP: 104/69 96/61 104/67 106/65  Pulse: 80 82 80 74  Resp:  19 16 16   Temp: 98.4 F (36.9 C) 98.6 F (37 C) 98.7 F (37.1 C) 98 F (36.7 C)  TempSrc: Oral Oral Oral   SpO2: 99% 99% 98% 99%  Weight:      Height:       Weight change:   Physical Examination:   General: Appearance:     Overweight male in no acute distress     Lungs:     respirations unlabored  Heart:    Normal heart rate.   MS:   All extremities are intact.   Neurologic:   Awake, alert      Medications reviewed:  Scheduled Meds:  vitamin C  500 mg Oral BID   feeding supplement  237 mL Oral TID BM   ferrous sulfate  325 mg Oral BID WC   folic acid  1 mg Oral Daily   multivitamin with minerals  1 tablet Oral  Daily   thiamine  100 mg Oral Daily   Continuous Infusions:   Diet Order             Diet regular Room service appropriate? Yes; Fluid consistency: Thin  Diet effective now                  Intake/Output Summary (Last 24 hours) at 05/01/2022 1135 Last data filed at 05/01/2022 0600 Gross per 24 hour  Intake 250 ml  Output 1350 ml  Net -1100 ml   Net IO Since Admission: 1,661.81 mL [05/01/22 1135]  Wt Readings from Last 3 Encounters:  04/25/22 83.9 kg  10/24/21 79.4 kg  09/20/21 79.4 kg     Unresulted Labs (From admission, onward)    None     Data Reviewed: I have personally reviewed following labs and imaging studies CBC: Recent Labs  Lab 04/25/22 1456 04/25/22 1536 04/27/22 0103 04/28/22 0358 04/29/22 0422 04/30/22 0346 05/01/22 0355  WBC 13.0*   < > 9.8 8.0 7.0 6.9 8.2  NEUTROABS 10.9*  --   --   --   --   --   --   HGB 9.6*   < > 7.3* 7.5* 7.7* 8.0* 7.6*  HCT 26.7*   < > 21.6* 22.3* 22.8* 24.1* 23.1*  MCV  94.0   < > 98.2 99.1 101.3* 101.7* 101.3*  PLT 181   < > 132* 129* 118* 129* 149*   < > = values in this interval not displayed.   Basic Metabolic Panel: Recent Labs  Lab 04/25/22 1456 04/25/22 1536 04/26/22 0436 04/26/22 1000 04/27/22 0103 04/28/22 0358 04/29/22 0422 04/30/22 0346 05/01/22 0355  NA 126*   < >  --    < > 131* 132* 133* 133* 131*  K 2.7*   < >  --    < > 3.6 3.6 3.6 3.3* 3.7  CL 79*   < >  --    < > 98 100 104 102 104  CO2 29   < >  --    < > 26 23 22 23  20*  GLUCOSE 121*   < >  --    < > 99 100* 97 94 99  BUN 15   < >  --    < > 7 5* 9 9 8   CREATININE 0.66   < >  --    < > 0.52* 0.58* 0.55* 0.59* 0.44*  CALCIUM 9.3   < >  --    < > 8.2* 8.3* 8.1* 8.2* 8.1*  MG 2.1  --  2.0  --   --  1.4* 1.6* 1.8  --   PHOS  --   --   --   --   --  2.4*  --   --   --    < > = values in this interval not displayed.   GFR: Estimated Creatinine Clearance: 114.5 mL/min (A) (by C-G formula based on SCr of 0.44 mg/dL (L)). Liver Function  Tests: Recent Labs  Lab 04/25/22 1456 04/26/22 0100 04/28/22 0358  AST 93* 69* 81*  ALT 67* 50* 47*  ALKPHOS 175* 132* 136*  BILITOT 4.0* 2.7* 1.3*  PROT 8.9* 6.9 6.1*  ALBUMIN 3.1* 2.4* 2.1*   Recent Labs  Lab 04/25/22 1456  LIPASE 38   Recent Labs  Lab 04/25/22 1708 04/26/22 0436  AMMONIA 22 19   Coagulation Profile: Recent Labs  Lab 04/25/22 1456  INR 1.0   BNP (last 3 results) No results for input(s): "PROBNP" in the last 8760 hours. HbA1C: No results for input(s): "HGBA1C" in the last 72 hours. CBG: Recent Labs  Lab 04/26/22 0433  GLUCAP 76   Lipid Profile: No results for input(s): "CHOL", "HDL", "LDLCALC", "TRIG", "CHOLHDL", "LDLDIRECT" in the last 72 hours. Thyroid Function Tests: No results for input(s): "TSH", "T4TOTAL", "FREET4", "T3FREE", "THYROIDAB" in the last 72 hours. Sepsis Labs: Recent Labs  Lab 04/25/22 1456 04/25/22 1708 04/26/22 1530  LATICACIDVEN 2.2* 2.0* 1.0    Recent Results (from the past 240 hour(s))  Blood Culture (routine x 2)     Status: None   Collection Time: 04/25/22  2:44 PM   Specimen: Site Not Specified; Blood  Result Value Ref Range Status   Specimen Description   Final    SITE NOT SPECIFIED Performed at [facility], [address] [place], [place]    Special Requests   Final    BOTTLES DRAWN AEROBIC AND ANAEROBIC Blood Culture adequate volume Performed at [facility], [address], [place], [place]    Culture   Final    NO GROWTH 5 DAYS Performed at [facility], [address] [place], [address] [place]    Report Status 04/30/2022 FINAL  Final  Blood  Culture (routine x 2)     Status: None   Collection Time: 04/25/22  2:56 PM   Specimen: BLOOD LEFT HAND  Result Value Ref Range Status   Specimen Description   Final    BLOOD LEFT HAND Performed at [facility] [address] [place], [place] [address]    Special  Requests   Final    BOTTLES DRAWN AEROBIC ONLY Blood Culture results may not be optimal due to an inadequate volume of blood received in culture bottles Performed at [facility] [address], [place], [place] [address]    Culture   Final    NO GROWTH 5 DAYS Performed at [facility], [place] [address] [place], [place] [address]    Report Status 04/30/2022 FINAL  Final  SARS Coronavirus 2 by RT PCR (hospital order, performed in [facility] hospital lab) *cepheid single result test* Anterior Nasal Swab     Status: None   Collection Time: 04/25/22  5:04 PM   Specimen: Anterior Nasal Swab  Result Value Ref Range Status   SARS Coronavirus 2 by RT PCR NEGATIVE NEGATIVE Final    Comment: (NOTE) SARS-CoV-2 target nucleic acids are NOT DETECTED.  The SARS-CoV-2 RNA is generally detectable in upper and lower respiratory specimens during the acute phase of infection. The lowest concentration of SARS-CoV-2 viral copies this assay can detect is 250 copies / mL. A negative result does not preclude SARS-CoV-2 infection and should not be used as the sole basis for treatment or other patient management decisions.  A negative result may occur with improper specimen collection / handling, submission of specimen other than nasopharyngeal swab, presence of viral mutation(s) within the areas targeted by this assay, and inadequate number of viral copies (<250 copies / mL). A negative result must be combined with clinical observations, patient history, and epidemiological information.  Fact Sheet for Patients:   https://www.patel.info/  Fact Sheet for Healthcare Providers: https://hall.com/  This test is not yet approved or  cleared by the [place] FDA and has been authorized for detection and/or diagnosis of SARS-CoV-2 by FDA under an Emergency Use Authorization (EUA).  This EUA will remain in effect (meaning this test can be  used) for the duration of the COVID-19 declaration under Section 564(b)(1) of the Act, 21 U.S.C. section 360bbb-3(b)(1), unless the authorization is terminated or revoked sooner.  Performed at [facility], [place] [address] [place], [place] [address]   C Difficile Quick Screen w PCR reflex     Status: None   Collection Time: 04/26/22  6:00 PM   Specimen: STOOL  Result Value Ref Range Status   C Diff antigen NEGATIVE NEGATIVE Final   C Diff toxin NEGATIVE NEGATIVE Final   C Diff interpretation No C. difficile detected.  Final    Comment: Performed at [facility], [place] [address] [place],  [address]  Gastrointestinal Panel by PCR , Stool     Status: Abnormal   Collection Time: 04/26/22  6:00 PM   Specimen: STOOL  Result Value Ref Range Status   Campylobacter species NOT DETECTED NOT DETECTED Final   Plesimonas shigelloides NOT DETECTED NOT DETECTED Final   Salmonella species NOT DETECTED NOT DETECTED Final   Yersinia enterocolitica NOT DETECTED NOT DETECTED Final   Vibrio species NOT DETECTED NOT DETECTED Final   Vibrio cholerae NOT DETECTED NOT DETECTED Final   Enteroaggregative E coli (EAEC) NOT DETECTED NOT DETECTED Final   Enteropathogenic  E coli (EPEC) DETECTED (A) NOT DETECTED Final    Comment: RESULT CALLED TO, READ BACK BY AND VERIFIED WITH: DERRICK RICHTER 04/27/22 1028 KLW    Enterotoxigenic E coli (ETEC) NOT DETECTED NOT DETECTED Final   Shiga like toxin producing E coli (STEC) NOT DETECTED NOT DETECTED Final   Shigella/Enteroinvasive E coli (EIEC) NOT DETECTED NOT DETECTED Final   Cryptosporidium NOT DETECTED NOT DETECTED Final   Cyclospora cayetanensis NOT DETECTED NOT DETECTED Final   Entamoeba histolytica NOT DETECTED NOT DETECTED Final   Giardia lamblia NOT DETECTED NOT DETECTED Final   Adenovirus F40/41 NOT DETECTED NOT DETECTED Final   Astrovirus NOT DETECTED NOT DETECTED Final   Norovirus GI/GII NOT DETECTED NOT  DETECTED Final   Rotavirus A NOT DETECTED NOT DETECTED Final   Sapovirus (I, II, IV, and V) NOT DETECTED NOT DETECTED Final    Comment: Performed at [facility], [address] [place], [place] [address]    Antimicrobials: Anti-infectives (From admission, onward)    Start     Dose/Rate Route Frequency Ordered Stop   04/28/22 1400  amoxicillin-clavulanate (AUGMENTIN) 875-125 MG per tablet 1 tablet        1 tablet Oral Every 12 hours 04/28/22 1139 04/30/22 2139   04/25/22 1800  Ampicillin-Sulbactam (UNASYN) 3 g in sodium chloride 0.9 % 100 mL IVPB  Status:  Discontinued        3 g 200 mL/hr over 30 Minutes Intravenous Every 6 hours 04/25/22 1748 04/28/22 1139      Culture/Microbiology    Component Value Date/Time   SDES  04/25/2022 1456    BLOOD LEFT HAND Performed at [facility], [place] [address], [place], [place] [address]    SPECREQUEST  04/25/2022 1456    BOTTLES DRAWN AEROBIC ONLY Blood Culture results may not be optimal due to an inadequate volume of blood received in culture bottles Performed at [facility], [place] [address], [place], [place] [address]    CULT  04/25/2022 1456    NO GROWTH 5 DAYS Performed at [facility] [address], [place], [place] [address]    REPTSTATUS 04/30/2022 FINAL 04/25/2022 1456  Other culture-see note  Radiology Studies: No results found.   LOS: 6 days   Geradine Girt, DO [facility]  05/01/2022, 11:35 AM

## 2022-05-01 NOTE — TOC Progression Note (Signed)
Transition of Care [facility]) - Progression Note    Patient Details  Name: Aydien Majette MRN: [id number] Date of Birth: [birth year]  [facility]) CM/SW Contact  Golda Acre, RN Phone Number: 05/01/2022, 9:15 AM  Clinical Narrative:    [phone] chart reviewed.  Following for toc needs.  Plan is to be determined after being seen by physical therapy this am.   Expected Discharge Plan: Home/Self Care Barriers to Discharge: Continued Medical Work up  Expected Discharge Plan and Services Expected Discharge Plan: Home/Self Care   Discharge Planning Services: CM Consult   Living arrangements for the past 2 months: Single Family Home                                       Social Determinants of Health (SDOH) Interventions    Readmission Risk Interventions     No data to display

## 2022-05-01 NOTE — Progress Notes (Signed)
Physical Therapy Treatment Patient Details Name: Dylan Weaver MRN: [id number] DOB: 06/01/1966 Today's Date: 05/01/2022   History of Present Illness 55 y.o.m w/ history of alcohol abuse, previous history of alcoholic hepatitis brought into the ED by EMS after neighbor found the patient lying on the floor covered in feces and surrounded by empty beer cans.  Patient apparently reported to EMS that he fell 2 days prior to presentation. He was confused and history is very limited.  Was complaining of headache.   Pt admitted 04/25/22 for hyponatremia and Enteropathogenic E. coli diarrhea    PT Comments    Pt is progressing this date. Slowly improving tolerance to activity however continues to be extremely deconditioned, fatigues quickly, at risk for falls. Continue to recommend SNF. Will follow in acute setting.  Recommendations for follow up therapy are one component of a multi-disciplinary discharge planning process, led by the attending physician.  Recommendations may be updated based on patient status, additional functional criteria and insurance authorization.  Follow Up Recommendations  Skilled nursing-short term rehab (<3 hours/day) Can patient physically be transported by private vehicle: No   Assistance Recommended at Discharge Intermittent Supervision/Assistance  Patient can return home with the following A lot of help with walking and/or transfers;A lot of help with bathing/dressing/bathroom;Assist for transportation;Assistance with cooking/housework   Equipment Recommendations  None recommended by PT    Recommendations for Other Services       Precautions / Restrictions Precautions Precautions: Fall Restrictions Weight Bearing Restrictions: No     Mobility  Bed Mobility Overal bed mobility: Needs Assistance Bed Mobility: Supine to Sit     Supine to sit: Min guard, Supervision     General bed mobility comments: utilizes bed rail to self assist, uses momentum  to come to upright    Transfers Overall transfer level: Needs assistance Equipment used: Rolling walker (2 wheels) Transfers: Sit to/from Stand Sit to Stand: Min assist, +2 physical assistance, +2 safety/equipment           General transfer comment: assist to rise and transition to RW on standing; posterior bias on 1st rial, wide BOS to compensate for decr balance. improved on secodn trial and able to achieve midline with less assist; cues forhand placement and to power up with LEs    Ambulation/Gait Ambulation/Gait assistance: Min assist, +2 safety/equipment Gait Distance (Feet): 5 Feet Assistive device: Rolling walker (2 wheels) Gait Pattern/deviations: Step-to pattern, Wide base of support     Pre-gait activities: lateral wt shifting, marching in place\ General Gait Details: steps forward and back to chair, unsteady however no knee buckling this session. pt report significant fatigue   Stairs             Wheelchair Mobility    Modified Rankin (Stroke Patients Only)       Balance Overall balance assessment: History of Falls         Standing balance support: Bilateral upper extremity supported, Reliant on assistive device for balance Standing balance-Leahy Scale: Poor Standing balance comment: requiring external assist and UE support                            Cognition Arousal/Alertness: Awake/alert Behavior During Therapy: WFL for tasks assessed/performed   Area of Impairment: Following commands                       Following Commands: Follows one step commands with increased time  Exercises General Exercises - Lower Extremity Ankle Circles/Pumps: AROM, Both, 10 reps Quad Sets: AROM, Both, 10 reps    General Comments        Pertinent Vitals/Pain Pain Assessment Pain Assessment: No/denies pain    Home Living                          Prior Function            PT Goals (current goals  can now be found in the care plan section) Acute Rehab PT Goals PT Goal Formulation: With patient Time For Goal Achievement: 05/11/22 Potential to Achieve Goals: Good Progress towards PT goals: Progressing toward goals    Frequency    Min 3X/week      PT Plan Current plan remains appropriate    Co-evaluation              AM-PAC PT "6 Clicks" Mobility   Outcome Measure  Help needed turning from your back to your side while in a flat bed without using bedrails?: A Little Help needed moving from lying on your back to sitting on the side of a flat bed without using bedrails?: A Little Help needed moving to and from a bed to a chair (including a wheelchair)?: A Lot Help needed standing up from a chair using your arms (e.g., wheelchair or bedside chair)?: A Lot Help needed to walk in hospital room?: A Lot Help needed climbing 3-5 steps with a railing? : Total 6 Click Score: 13    End of Session   Activity Tolerance: Patient tolerated treatment well Patient left: in chair;with call bell/phone within reach;with chair alarm set   PT Visit Diagnosis: Difficulty in walking, not elsewhere classified (R26.2);Muscle weakness (generalized) (M62.81)     Time: 2778-2423 PT Time Calculation (min) (ACUTE ONLY): 14 min  Charges:  $Therapeutic Activity: 8-22 mins                     Delice Bison, PT  Acute Rehab Dept [facility]) [phone]  [facility] Weekend Pager [facility] only)  [phone]  05/01/2022    [facility] 05/01/2022, 4:55 PM

## 2022-05-02 DIAGNOSIS — E871 Hypo-osmolality and hyponatremia: Secondary | ICD-10-CM | POA: Diagnosis not present

## 2022-05-02 MED ORDER — SODIUM CHLORIDE 0.9 % IV SOLN: INTRAVENOUS | Status: AC

## 2022-05-02 NOTE — Plan of Care (Signed)
  Problem: Clinical Measurements: Goal: Respiratory complications will improve Outcome: Progressing Goal: Cardiovascular complication will be avoided Outcome: Progressing   Problem: Activity: Goal: Risk for activity intolerance will decrease Outcome: Progressing   Problem: Nutrition: Goal: Adequate nutrition will be maintained Outcome: Progressing   Problem: Coping: Goal: Level of anxiety will decrease Outcome: Progressing   

## 2022-05-02 NOTE — Progress Notes (Signed)
Occupational Therapy Treatment Patient Details Name: Dylan Weaver MRN: [id number] DOB: 18-Aug-1966 Today's Date: 05/02/2022   History of present illness 56 y.o.m w/ history of alcohol abuse, previous history of alcoholic hepatitis brought into the ED by EMS after neighbor found the patient lying on the floor covered in feces and surrounded by empty beer cans.  Patient apparently reported to EMS that he fell 2 days prior to presentation. He was confused and history is very limited.  Was complaining of headache.   Pt admitted 04/25/22 for hyponatremia and Enteropathogenic E. coli diarrhea   OT comments  Patient progressing and showed improved ability to take steps to sink for grooming and limited standing tolerance for oral and face hygiene, but able to complete majority of 2/3 tasks while standing with Min As, compared to previous session when pt performed seated ADLs only. Patient remains limited by impulsivity and confusion with poor safety awareness and decreased awareness of deficits, generalized weakness and decreased activity tolerance along with deficits noted below. Pt continues to demonstrate fair rehab potential and would benefit from continued skilled OT to increase safety and independence with ADLs and functional transfers to allow pt to return home safely and reduce caregiver burden and fall risk.    Recommendations for follow up therapy are one component of a multi-disciplinary discharge planning process, led by the attending physician.  Recommendations may be updated based on patient status, additional functional criteria and insurance authorization.    Follow Up Recommendations  Skilled nursing-short term rehab (<3 hours/day)    Assistance Recommended at Discharge Frequent or constant Supervision/Assistance  Patient can return home with the following  A little help with walking and/or transfers;A lot of help with bathing/dressing/bathroom;Assistance with cooking/housework    Equipment Recommendations       Recommendations for Other Services      Precautions / Restrictions Precautions Precautions: Fall Precaution Comments: Impulsive Restrictions Weight Bearing Restrictions: No       Mobility Bed Mobility Overal bed mobility: Needs Assistance Bed Mobility: Supine to Sit Rolling: Supervision              Transfers                         Balance Overall balance assessment: History of Falls, Needs assistance   Sitting balance-Leahy Scale: Fair     Standing balance support: Bilateral upper extremity supported, Reliant on assistive device for balance Standing balance-Leahy Scale: Poor Standing balance comment: requiring external assist and UE support                           ADL either performed or assessed with clinical judgement   ADL Overall ADL's : Needs assistance/impaired     Grooming: Standing;Sitting;Wash/dry face;Oral care;Cueing for sequencing;Cueing for safety;Minimal assistance;Maximal assistance Grooming Details (indicate cue type and reason): Pt stood from EOB to RW with Min As. Pt ambulated ~4' to sink and stood to initiate oral hygiene. During task, pt had sudden LOB, moving feet at awkard angle with need of Max As to recliner for safety. Recliner moved anterior to sink and pt stood 2nd time to complete oral hygiene. Pt sat again without warning. Needed cues to wash face and pt stood 3rd time recliner->sink and washed face with 1 cue needed to remain standing and pt initiated sit again, but able to correct and resume standing position until face washing completed. Pt applied lotion to face and  hands in recliner.                   Toilet Transfer Details (indicate cue type and reason): See Grooming above for transfers. Toileting- Clothing Manipulation and Hygiene: Total assistance Toileting - Clothing Manipulation Details (indicate cue type and reason): Pt on external catheter             Extremity/Trunk Assessment Upper Extremity Assessment Upper Extremity Assessment: Overall WFL for tasks assessed   Lower Extremity Assessment Lower Extremity Assessment: Defer to PT evaluation   Cervical / Trunk Assessment Cervical / Trunk Assessment: Normal    Vision   Vision Assessment?: No apparent visual deficits   Perception     Praxis      Cognition Arousal/Alertness: Awake/alert Behavior During Therapy: WFL for tasks assessed/performed Overall Cognitive Status: Within Functional Limits for tasks assessed Area of Impairment: Following commands, Orientation, Safety/judgement, Awareness, Problem solving, Attention, Memory                 Orientation Level: Time (Not month, "April" "May" Pt expressed surprise that it is August.) Current Attention Level: Sustained Memory: Decreased recall of precautions, Decreased short-term memory Following Commands: Follows one step commands with increased time Safety/Judgement: Decreased awareness of safety, Decreased awareness of deficits Awareness: Emergent Problem Solving: Requires verbal cues, Requires tactile cues, Slow processing General Comments: Impulsive.        Exercises      Shoulder Instructions       General Comments      Pertinent Vitals/ Pain       Pain Assessment Pain Assessment: No/denies pain  Home Living Family/patient expects to be discharged to:: Private residence Living Arrangements: Alone   Type of Home: House Home Access: Stairs to enter Secretary/administrator of Steps: 3-4 Entrance Stairs-Rails: Right;Left Home Layout: One level     Bathroom Shower/Tub: Producer, television/film/video: Standard                Prior Functioning/Environment              Frequency  Min 2X/week        Progress Toward Goals  OT Goals(current goals can now be found in the care plan section)  Progress towards OT goals: Progressing toward goals  Acute Rehab OT Goals Patient Stated  Goal: "More of this I guess. Get moving." OT Goal Formulation: With patient Time For Goal Achievement: 05/12/22 Potential to Achieve Goals: Good  Plan Discharge plan remains appropriate    Co-evaluation                 AM-PAC OT "6 Clicks" Daily Activity     Outcome Measure   Help from another person eating meals?: A Little Help from another person taking care of personal grooming?: A Little Help from another person toileting, which includes using toliet, bedpan, or urinal?: Total Help from another person bathing (including washing, rinsing, drying)?: A Lot Help from another person to put on and taking off regular upper body clothing?: A Little Help from another person to put on and taking off regular lower body clothing?: A Lot 6 Click Score: 14    End of Session Equipment Utilized During Treatment: Rolling walker (2 wheels);Gait belt  OT Visit Diagnosis: Unsteadiness on feet (R26.81);History of falling (Z91.81)   Activity Tolerance Patient tolerated treatment well   Patient Left in chair;with call bell/phone within reach;with chair alarm set   Nurse Communication Other (comment) (Cognition, up in chair with  chair alarm set, waiting breakfast)        Time: 7782-4235 OT Time Calculation (min): 25 min  Charges: OT General Charges $OT Visit: 1 Visit OT Treatments $Self Care/Home Management : 8-22 mins $Therapeutic Activity: 8-22 mins  Victorino Dike, OT Acute Rehab Services Office: [phone] 05/02/2022  Theodoro Clock 05/02/2022, 9:54 AM

## 2022-05-02 NOTE — Progress Notes (Signed)
PROGRESS NOTE Dylan Weaver  [id number] DOB: November 01, 1965 DOA: 04/25/2022 PCP: [facility]   Brief Narrative/Hospital Course: 56 y.o.m w/ history of alcohol abuse, previous history of alcoholic hepatitis brought into the ED by EMS after neighbor found the patient lying on the floor covered in feces and surrounded by empty beer cans.  Patient apparently reported to EMS that he fell 2 days prior to presentation. He was confused and history is very limited.  He had diarrhea and GI panel came back positive for enteropathogenic E. coli and resolved without treatment he had hypovolemic hyponatremia dehydration that resolved with IV fluids.  His encephalopathy has significantly improved.  He remains weak and deconditioned PT OT recommend skilled nursing facility placement    Subjective: Asking for a beer   Assessment and Plan: Principal Problem:   Hyponatremia Active Problems:   Alcohol use disorder with alcohol withdrawal    Thrombocytopenia (HCC)   Normochromic normocytic anemia   Elevated LFTs   Prolonged QT interval   Pressure ulcer   Acute metabolic encephalopathy   Hypokalemia   Aspiration pneumonia (HCC)   Enteropathogenic Escherichia coli infection   Hypophosphatemia    Enteropathogenic E. coli diarrhea: diarrhea resolved.   -enteric precaution  -continue diet  Hypovolemic hyponatremia Dehydration/ketosis in urine: -resume IVF for today as BP low as well  Hypokalemia/hypomagnesemia hypophosphatemia: K improved for slightly low mag  -Replete mag IV  Aspiration pneumonia with CT showing-left-sided groundglass opacities  -cxr neg. -Likely aspirated. -changed unasyn to augmentin- now abx have finished  Transaminitis/abnormal lfts:  -likely etoh related.likely secondary to his alcohol abuse. CT scan showed cholelithiasis without evidence of cholecystitis.Hepatic steatosis noted.  Acute hepatitis panel negative.  AST/ALT 69/50 T. bili2.7 > downtrending.     Acute metabolic encephalopathy:  likely due to metabolic derangements/alcoholism/ aspiration.non focal on exam. CT head- non acute-ammonia level normal.  Due to his alcohol abuse history started on high-dose thiamine,ciwa ativan, etoh cessation counseling.  Mental status has improved.  PTOT eval-advised skilled nursing facility  Alcohol abuse with alcohol withdrawal: Last alcohol use unknown patient reports 2 days PTA, drinks at least 12 beers on daily basis, possibly liquid.  Continue high-dose thiamine, folic acid, CIWA scale Ativan prn. TOC consult for cessation.  Mildly elevated lactic acid likely from dehydration related decreased clearance due to his hepatic derangement steatosis.  Resolved  Old healed left rib fractures/old clavicle fracture/chronic L1 compression fracture : Respiratory status is stable, no chest pain.  Continue incentive spirometry, pain control.    Normocytic anemia Folic acid deficiency Thrombocytopenia: Suspect multifactorial hemodilution, anemia of chronic disease.  No active bleeding noted Hemoccult was negative.B12 is normal, iron normal 53 ferritin high 913.Last colonoscopy couple of years ago he says and was " okay" per him. Patient will need extensive follow-up with GI PCP and age-appropriate cancer screening as outpatient.  - po iron  Cholelithiasis: Noted incidentally on CT scan -No cholecystitis. Monitor.  QT prolongation: Likely from electrolyte imbalance repleting electrolytes aggressively.monitor in tele.   Deconditioning/debility obtain PT OT eval, encourage ambulation  Pressure ulcer POA on the back stage II on the left shoulder DTI Pressure Injury 04/26/22 Back Mid Stage 2 -  Partial thickness loss of dermis presenting as a shallow open injury with a red, pink wound bed without slough. (Active)  04/26/22 1517  Location: Back  Location Orientation: Mid  Staging: Stage 2 -  Partial thickness loss of dermis presenting as a shallow open injury  with a red, pink wound bed  without slough.  Wound Description (Comments):   Present on Admission: Yes     Pressure Injury 04/27/22 Shoulder Left;Posterior Deep Tissue Pressure Injury - Purple or maroon localized area of discolored intact skin or blood-filled blister due to damage of underlying soft tissue from pressure and/or shear. (Active)  04/27/22   Location: Shoulder  Location Orientation: Left;Posterior  Staging: Deep Tissue Pressure Injury - Purple or maroon localized area of discolored intact skin or blood-filled blister due to damage of underlying soft tissue from pressure and/or shear.  Wound Description (Comments):   Present on Admission: Yes     DVT prophylaxis: SCDs Start: 04/25/22 1729.  No chemical prophylaxis due to anemia/ and drop in hemoglobin  Code Status:   Code Status: Full Code    Level of care: Med-Surg  Dispo: The patient is from: home            Anticipated disposition: SNF once available.  Medically stable    Objective: Vitals last 24 hrs: Vitals:   05/01/22 0349 05/01/22 1215 05/01/22 2137 05/02/22 0410  BP: 106/65 (!) 92/58 (!) 93/57 94/62  Pulse: 74 69 79 75  Resp: 16 19 18 18   Temp: 98 F (36.7 C) 97.9 F (36.6 C) 98 F (36.7 C) 97.6 F (36.4 C)  TempSrc:  Oral Oral Oral  SpO2: 99% 98% 99% 98%  Weight:      Height:       Weight change:   Physical Examination:   General: Appearance:     Overweight male in no acute distress     Lungs:     respirations unlabored  Heart:    Normal heart rate.   MS:   All extremities are intact.   Neurologic:   Awake, alert      Medications reviewed:  Scheduled Meds:  vitamin C  500 mg Oral BID   feeding supplement  237 mL Oral TID BM   ferrous sulfate  325 mg Oral BID WC   folic acid  1 mg Oral Daily   multivitamin with minerals  1 tablet Oral Daily   thiamine  100 mg Oral Daily   Continuous Infusions:   Diet Order             Diet regular Room service appropriate? Yes; Fluid consistency:  Thin  Diet effective now                  Intake/Output Summary (Last 24 hours) at 05/02/2022 1042 Last data filed at 05/02/2022 0600 Gross per 24 hour  Intake 960 ml  Output 1275 ml  Net -315 ml   Net IO Since Admission: 1,706.81 mL [05/02/22 1042]  Wt Readings from Last 3 Encounters:  04/25/22 83.9 kg  10/24/21 79.4 kg  09/20/21 79.4 kg     Unresulted Labs (From admission, onward)    None     Data Reviewed: I have personally reviewed following labs and imaging studies CBC: Recent Labs  Lab 04/25/22 1456 04/25/22 1536 04/27/22 0103 04/28/22 0358 04/29/22 0422 04/30/22 0346 05/01/22 0355  WBC 13.0*   < > 9.8 8.0 7.0 6.9 8.2  NEUTROABS 10.9*  --   --   --   --   --   --   HGB 9.6*   < > 7.3* 7.5* 7.7* 8.0* 7.6*  HCT 26.7*   < > 21.6* 22.3* 22.8* 24.1* 23.1*  MCV 94.0   < > 98.2 99.1 101.3* 101.7* 101.3*  PLT 181   < > 132*  129* 118* 129* 149*   < > = values in this interval not displayed.   Basic Metabolic Panel: Recent Labs  Lab 04/25/22 1456 04/25/22 1536 04/26/22 0436 04/26/22 1000 04/27/22 0103 04/28/22 0358 04/29/22 0422 04/30/22 0346 05/01/22 0355  NA 126*   < >  --    < > 131* 132* 133* 133* 131*  K 2.7*   < >  --    < > 3.6 3.6 3.6 3.3* 3.7  CL 79*   < >  --    < > 98 100 104 102 104  CO2 29   < >  --    < > 26 23 22 23  20*  GLUCOSE 121*   < >  --    < > 99 100* 97 94 99  BUN 15   < >  --    < > 7 5* 9 9 8   CREATININE 0.66   < >  --    < > 0.52* 0.58* 0.55* 0.59* 0.44*  CALCIUM 9.3   < >  --    < > 8.2* 8.3* 8.1* 8.2* 8.1*  MG 2.1  --  2.0  --   --  1.4* 1.6* 1.8  --   PHOS  --   --   --   --   --  2.4*  --   --   --    < > = values in this interval not displayed.   GFR: Estimated Creatinine Clearance: 114.5 mL/min (A) (by C-G formula based on SCr of 0.44 mg/dL (L)). Liver Function Tests: Recent Labs  Lab 04/25/22 1456 04/26/22 0100 04/28/22 0358  AST 93* 69* 81*  ALT 67* 50* 47*  ALKPHOS 175* 132* 136*  BILITOT 4.0* 2.7* 1.3*  PROT  8.9* 6.9 6.1*  ALBUMIN 3.1* 2.4* 2.1*   Recent Labs  Lab 04/25/22 1456  LIPASE 38   Recent Labs  Lab 04/25/22 1708 04/26/22 0436  AMMONIA 22 19   Coagulation Profile: Recent Labs  Lab 04/25/22 1456  INR 1.0   BNP (last 3 results) No results for input(s): "PROBNP" in the last 8760 hours. HbA1C: No results for input(s): "HGBA1C" in the last 72 hours. CBG: Recent Labs  Lab 04/26/22 0433  GLUCAP 76   Lipid Profile: No results for input(s): "CHOL", "HDL", "LDLCALC", "TRIG", "CHOLHDL", "LDLDIRECT" in the last 72 hours. Thyroid Function Tests: No results for input(s): "TSH", "T4TOTAL", "FREET4", "T3FREE", "THYROIDAB" in the last 72 hours. Sepsis Labs: Recent Labs  Lab 04/25/22 1456 04/25/22 1708 04/26/22 1530  LATICACIDVEN 2.2* 2.0* 1.0    Recent Results (from the past 240 hour(s))  Blood Culture (routine x 2)     Status: None   Collection Time: 04/25/22  2:44 PM   Specimen: Site Not Specified; Blood  Result Value Ref Range Status   Specimen Description   Final    SITE NOT SPECIFIED Performed at [facility], [address], [place], [place]    Special Requests   Final    BOTTLES DRAWN AEROBIC AND ANAEROBIC Blood Culture adequate volume Performed at [facility], [address] [place], [place]    Culture   Final    NO GROWTH 5 DAYS Performed at [facility], [address], [place], [address] [place]    Report Status 04/30/2022 FINAL  Final  Blood Culture (routine x 2)     Status: None   Collection Time: 04/25/22  2:56 PM  Specimen: BLOOD LEFT HAND  Result Value Ref Range Status   Specimen Description   Final    BLOOD LEFT HAND Performed at [facility] [address], [place], [place] [address]    Special Requests   Final    BOTTLES DRAWN AEROBIC ONLY Blood Culture results may not be optimal due to an inadequate volume of blood received in culture bottles Performed  at [facility] [place] [address], [place], [place] [address]    Culture   Final    NO GROWTH 5 DAYS Performed at [facility], [place] [address], [place], [place] [address]    Report Status 04/30/2022 FINAL  Final  SARS Coronavirus 2 by RT PCR (hospital order, performed in [facility] hospital lab) *cepheid single result test* Anterior Nasal Swab     Status: None   Collection Time: 04/25/22  5:04 PM   Specimen: Anterior Nasal Swab  Result Value Ref Range Status   SARS Coronavirus 2 by RT PCR NEGATIVE NEGATIVE Final    Comment: (NOTE) SARS-CoV-2 target nucleic acids are NOT DETECTED.  The SARS-CoV-2 RNA is generally detectable in upper and lower respiratory specimens during the acute phase of infection. The lowest concentration of SARS-CoV-2 viral copies this assay can detect is 250 copies / mL. A negative result does not preclude SARS-CoV-2 infection and should not be used as the sole basis for treatment or other patient management decisions.  A negative result may occur with improper specimen collection / handling, submission of specimen other than nasopharyngeal swab, presence of viral mutation(s) within the areas targeted by this assay, and inadequate number of viral copies (<250 copies / mL). A negative result must be combined with clinical observations, patient history, and epidemiological information.  Fact Sheet for Patients:   https://www.patel.info/  Fact Sheet for Healthcare Providers: https://hall.com/  This test is not yet approved or  cleared by the [place] FDA and has been authorized for detection and/or diagnosis of SARS-CoV-2 by FDA under an Emergency Use Authorization (EUA).  This EUA will remain in effect (meaning this test can be used) for the duration of the COVID-19 declaration under Section 564(b)(1) of the Act, 21 U.S.C. section 360bbb-3(b)(1), unless the authorization is terminated  or revoked sooner.  Performed at [facility], [place] [address] [place], [place] [address]   C Difficile Quick Screen w PCR reflex     Status: None   Collection Time: 04/26/22  6:00 PM   Specimen: STOOL  Result Value Ref Range Status   C Diff antigen NEGATIVE NEGATIVE Final   C Diff toxin NEGATIVE NEGATIVE Final   C Diff interpretation No C. difficile detected.  Final    Comment: Performed at [facility], [place] [address] [place], [place] [address]  Gastrointestinal Panel by PCR , Stool     Status: Abnormal   Collection Time: 04/26/22  6:00 PM   Specimen: STOOL  Result Value Ref Range Status   Campylobacter species NOT DETECTED NOT DETECTED Final   Plesimonas shigelloides NOT DETECTED NOT DETECTED Final   Salmonella species NOT DETECTED NOT DETECTED Final   Yersinia enterocolitica NOT DETECTED NOT DETECTED Final   Vibrio species NOT DETECTED NOT DETECTED Final   Vibrio cholerae NOT DETECTED NOT DETECTED Final   Enteroaggregative E coli (EAEC) NOT DETECTED NOT DETECTED Final   Enteropathogenic E coli (EPEC) DETECTED (A) NOT DETECTED Final    Comment: RESULT CALLED TO, READ BACK BY AND VERIFIED  WITH: DERRICK RICHTER 04/27/22 1028 KLW    Enterotoxigenic E coli (ETEC) NOT DETECTED NOT DETECTED Final   Shiga like toxin producing E coli (STEC) NOT DETECTED NOT DETECTED Final   Shigella/Enteroinvasive E coli (EIEC) NOT DETECTED NOT DETECTED Final   Cryptosporidium NOT DETECTED NOT DETECTED Final   Cyclospora cayetanensis NOT DETECTED NOT DETECTED Final   Entamoeba histolytica NOT DETECTED NOT DETECTED Final   Giardia lamblia NOT DETECTED NOT DETECTED Final   Adenovirus F40/41 NOT DETECTED NOT DETECTED Final   Astrovirus NOT DETECTED NOT DETECTED Final   Norovirus GI/GII NOT DETECTED NOT DETECTED Final   Rotavirus A NOT DETECTED NOT DETECTED Final   Sapovirus (I, II, IV, and V) NOT DETECTED NOT DETECTED Final    Comment: Performed at [facility], [address] [place], [place] [address]    Antimicrobials: Anti-infectives (From admission, onward)    Start     Dose/Rate Route Frequency Ordered Stop   04/28/22 1400  amoxicillin-clavulanate (AUGMENTIN) 875-125 MG per tablet 1 tablet        1 tablet Oral Every 12 hours 04/28/22 1139 04/30/22 2139   04/25/22 1800  Ampicillin-Sulbactam (UNASYN) 3 g in sodium chloride 0.9 % 100 mL IVPB  Status:  Discontinued        3 g 200 mL/hr over 30 Minutes Intravenous Every 6 hours 04/25/22 1748 04/28/22 1139      Culture/Microbiology    Component Value Date/Time   SDES  04/25/2022 1456    BLOOD LEFT HAND Performed at [facility], [address] [place], [place] [address]    SPECREQUEST  04/25/2022 1456    BOTTLES DRAWN AEROBIC ONLY Blood Culture results may not be optimal due to an inadequate volume of blood received in culture bottles Performed at [facility], [address], [place], [place] [address]    CULT  04/25/2022 1456    NO GROWTH 5 DAYS Performed at [facility], [address], [place], [place] [address]    REPTSTATUS 04/30/2022 FINAL 04/25/2022 1456  Other culture-see note  Radiology Studies: No results found.   LOS: 7 days   Joseph Art, DO [facility]  05/02/2022, 10:42 AM

## 2022-05-03 DIAGNOSIS — A040 Enteropathogenic Escherichia coli infection: Secondary | ICD-10-CM

## 2022-05-03 DIAGNOSIS — G9341 Metabolic encephalopathy: Secondary | ICD-10-CM | POA: Diagnosis not present

## 2022-05-03 DIAGNOSIS — F1020 Alcohol dependence, uncomplicated: Secondary | ICD-10-CM | POA: Diagnosis not present

## 2022-05-03 DIAGNOSIS — E871 Hypo-osmolality and hyponatremia: Secondary | ICD-10-CM | POA: Diagnosis not present

## 2022-05-03 DIAGNOSIS — R7989 Other specified abnormal findings of blood chemistry: Secondary | ICD-10-CM

## 2022-05-03 DIAGNOSIS — F1010 Alcohol abuse, uncomplicated: Secondary | ICD-10-CM

## 2022-05-03 DIAGNOSIS — D696 Thrombocytopenia, unspecified: Secondary | ICD-10-CM

## 2022-05-03 LAB — CBC WITH DIFFERENTIAL/PLATELET
Abs Immature Granulocytes: 0.05 10*3/uL (ref 0.00–0.07)
Basophils Absolute: 0.1 10*3/uL (ref 0.0–0.1)
Basophils Relative: 1 %
Eosinophils Absolute: 0.2 10*3/uL (ref 0.0–0.5)
Eosinophils Relative: 3 %
HCT: 27 % — ABNORMAL LOW (ref 39.0–52.0)
Hemoglobin: 9 g/dL — ABNORMAL LOW (ref 13.0–17.0)
Immature Granulocytes: 1 %
Lymphocytes Relative: 23 %
Lymphs Abs: 1.8 10*3/uL (ref 0.7–4.0)
MCH: 34 pg (ref 26.0–34.0)
MCHC: 33.3 g/dL (ref 30.0–36.0)
MCV: 101.9 fL — ABNORMAL HIGH (ref 80.0–100.0)
Monocytes Absolute: 0.5 10*3/uL (ref 0.1–1.0)
Monocytes Relative: 6 %
Neutro Abs: 5.2 10*3/uL (ref 1.7–7.7)
Neutrophils Relative %: 66 %
Platelets: 306 10*3/uL (ref 150–400)
RBC: 2.65 MIL/uL — ABNORMAL LOW (ref 4.22–5.81)
RDW: 16.8 % — ABNORMAL HIGH (ref 11.5–15.5)
WBC: 7.7 10*3/uL (ref 4.0–10.5)
nRBC: 0 % (ref 0.0–0.2)

## 2022-05-03 LAB — RAPID URINE DRUG SCREEN, HOSP PERFORMED
Amphetamines: NOT DETECTED
Barbiturates: NOT DETECTED
Benzodiazepines: NOT DETECTED
Cocaine: NOT DETECTED
Opiates: NOT DETECTED
Tetrahydrocannabinol: NOT DETECTED

## 2022-05-03 LAB — COMPREHENSIVE METABOLIC PANEL
ALT: 32 U/L (ref 0–44)
AST: 56 U/L — ABNORMAL HIGH (ref 15–41)
Albumin: 2.1 g/dL — ABNORMAL LOW (ref 3.5–5.0)
Alkaline Phosphatase: 157 U/L — ABNORMAL HIGH (ref 38–126)
Anion gap: 7 (ref 5–15)
BUN: 9 mg/dL (ref 6–20)
CO2: 22 mmol/L (ref 22–32)
Calcium: 8 mg/dL — ABNORMAL LOW (ref 8.9–10.3)
Chloride: 102 mmol/L (ref 98–111)
Creatinine, Ser: 0.46 mg/dL — ABNORMAL LOW (ref 0.61–1.24)
GFR, Estimated: >60 mL/min (ref >60)
Glucose, Bld: 101 mg/dL — ABNORMAL HIGH (ref 70–99)
Potassium: 3.7 mmol/L (ref 3.5–5.1)
Sodium: 131 mmol/L — ABNORMAL LOW (ref 135–145)
Total Bilirubin: 1.1 mg/dL (ref 0.3–1.2)
Total Protein: 6.4 g/dL — ABNORMAL LOW (ref 6.5–8.1)

## 2022-05-03 LAB — BASIC METABOLIC PANEL
Anion gap: 9 (ref 5–15)
BUN: 8 mg/dL (ref 6–20)
CO2: 19 mmol/L — ABNORMAL LOW (ref 22–32)
Calcium: 7.9 mg/dL — ABNORMAL LOW (ref 8.9–10.3)
Chloride: 105 mmol/L (ref 98–111)
Creatinine, Ser: 0.51 mg/dL — ABNORMAL LOW (ref 0.61–1.24)
GFR, Estimated: >60 mL/min (ref >60)
Glucose, Bld: 104 mg/dL — ABNORMAL HIGH (ref 70–99)
Potassium: 3.6 mmol/L (ref 3.5–5.1)
Sodium: 133 mmol/L — ABNORMAL LOW (ref 135–145)

## 2022-05-03 LAB — MAGNESIUM: Magnesium: 1.2 mg/dL — ABNORMAL LOW (ref 1.7–2.4)

## 2022-05-03 LAB — AMMONIA: Ammonia: 28 umol/L (ref 9–35)

## 2022-05-03 LAB — PHOSPHORUS: Phosphorus: 3.4 mg/dL (ref 2.5–4.6)

## 2022-05-03 MED ORDER — SODIUM CHLORIDE 0.9 % IV BOLUS
500.0000 mL | Freq: Once | INTRAVENOUS | Status: AC
  Administered 2022-05-03: 500 mL via INTRAVENOUS

## 2022-05-03 MED ORDER — DEXTROSE 5 % IV SOLN
3.0000 g | Freq: Once | INTRAVENOUS | Status: DC
Start: 2022-05-03 — End: 2022-05-03

## 2022-05-03 MED ORDER — MAGNESIUM SULFATE 4 GM/100ML IV SOLN
4.0000 g | Freq: Once | INTRAVENOUS | Status: DC
  Filled 2022-05-03: qty 100

## 2022-05-03 MED ORDER — MAGNESIUM SULFATE 2 GM/50ML IV SOLN
2.0000 g | Freq: Once | INTRAVENOUS | Status: AC
Start: 2022-05-03 — End: 2022-05-03
  Administered 2022-05-03: 2 g via INTRAVENOUS
  Filled 2022-05-03: qty 50

## 2022-05-03 MED ORDER — HEPARIN SODIUM (PORCINE) 5000 UNIT/ML IJ SOLN
5000.0000 [IU] | Freq: Three times a day (TID) | INTRAMUSCULAR | Status: DC
  Administered 2022-05-03 – 2022-05-10 (×22): 5000 [IU] via SUBCUTANEOUS
  Filled 2022-05-03 (×23): qty 1

## 2022-05-03 MED ORDER — MAGNESIUM SULFATE 4 GM/100ML IV SOLN
4.0000 g | Freq: Once | INTRAVENOUS | Status: AC
  Administered 2022-05-03: 4 g via INTRAVENOUS
  Filled 2022-05-03: qty 100

## 2022-05-03 NOTE — Progress Notes (Signed)
Pharmacy: SQ heparin for VTE prevention  Plan: heparin 5000 units SQ q8hrs Pharmacy to sign off  Herby Abraham, Pharm.D 05/03/2022 7:55 PM

## 2022-05-03 NOTE — Progress Notes (Addendum)
PROGRESS NOTE    Dylan Weaver  [id number] DOB: [birth year] DOA: 04/25/2022 PCP: [facility]     Brief Narrative:  56 y.o.WM PMHx EtOH abuse, Hx EtOH Hepatitis, prolonged QT interval, pressure ulcer,  Brought into the ED by EMS after neighbor found the patient lying on the floor covered in feces and surrounded by empty beer cans.  Patient apparently reported to EMS that he fell 2 days prior to presentation. He was confused and history is very limited.  He had diarrhea and GI panel came back positive for enteropathogenic E. coli and resolved without treatment he had hypovolemic hyponatremia dehydration that resolved with IV fluids.  His encephalopathy has significantly improved.  He remains weak and deconditioned PT OT recommend skilled nursing facility placement      Subjective: A/O x4, negative abdominal pain, negative SOB.  States lives at home alone.  Able to ambulate with walker.   Assessment & Plan: Covid vaccination;   Principal Problem:   Hyponatremia Active Problems:   Alcohol use disorder with alcohol withdrawal    Thrombocytopenia (HCC)   Normochromic normocytic anemia   Elevated LFTs   Prolonged QT interval   Pressure ulcer   Acute metabolic encephalopathy   Hypokalemia   Aspiration pneumonia (HCC)   Enteropathogenic Escherichia coli infection   Hypophosphatemia  Enteropathogenic E. coli diarrhea: diarrhea resolved.   -enteric precaution  -continue diet   Hypovolemic hyponatremia Dehydration/ketosis in urine: -resume IVF for today as BP low as well     Aspiration pneumonia with CT showing-left-sided groundglass opacities  -cxr neg. -Likely aspirated. -changed unasyn to augmentin- now abx have finished   Transaminitis/abnormal lfts:  -likely etoh related.likely secondary to his alcohol abuse. CT scan showed cholelithiasis without evidence of cholecystitis.Hepatic steatosis noted.  Acute hepatitis panel negative.  AST/ALT 69/50 T.  bili2.7 > downtrending.     Acute metabolic encephalopathy:  likely due to metabolic derangements/alcoholism/ aspiration.non focal on exam. CT head- non acute-ammonia level normal.  Due to his alcohol abuse history started on high-dose thiamine,ciwa ativan, etoh cessation counseling.  Mental status has improved.  PTOT eval-advised skilled nursing facility   Alcohol abuse with alcohol withdrawal:  Last alcohol use unknown patient reports 2 days PTA, drinks at least 12 beers on daily basis, possibly liquid.  Continue high-dose thiamine, folic acid, CIWA scale Ativan prn. TOC consult for cessation.   Lactic acidosis - 7/26 resolved     Old healed left rib fractures/old clavicle fracture/chronic L1 compression fracture : Respiratory status is stable, no chest pain.  Continue incentive spirometry, pain control.     Normocytic anemia Folic acid deficiency Thrombocytopenia: Suspect multifactorial hemodilution, anemia of chronic disease.  No active bleeding noted Hemoccult was negative.B12 is normal, iron normal 53 ferritin high 913.Last colonoscopy couple of years ago he says and was " okay" per him. Patient will need extensive follow-up with GI PCP and age-appropriate cancer screening as outpatient.  - po iron   Cholelithiasis: Noted incidentally on CT scan -No cholecystitis. Monitor.   QT prolongation: Likely from electrolyte imbalance repleting electrolytes aggressively.monitor in tele.    Deconditioning/debility obtain PT OT eval, encourage ambulation   Pressure ulcer POA on the back stage II on the left shoulder DTI  Pressure Injury 04/26/22 Back Mid Stage 2 -  Partial thickness loss of dermis presenting as a shallow open injury with a red, pink wound bed without slough. (Active)  04/26/22 1517  Location: Back  Location Orientation: Mid  Staging: Stage 2 -  Partial thickness loss of dermis presenting as a shallow open injury with a red, pink wound bed without slough.  Wound Description  (Comments):   Present on Admission: Yes  Dressing Type Foam - Lift dressing to assess site every shift 05/03/22 0544     Pressure Injury 04/27/22 Shoulder Left;Posterior Deep Tissue Pressure Injury - Purple or maroon localized area of discolored intact skin or blood-filled blister due to damage of underlying soft tissue from pressure and/or shear. (Active)  04/27/22   Location: Shoulder  Location Orientation: Left;Posterior  Staging: Deep Tissue Pressure Injury - Purple or maroon localized area of discolored intact skin or blood-filled blister due to damage of underlying soft tissue from pressure and/or shear.  Wound Description (Comments):   Present on Admission: Yes  Dressing Type None 05/01/22 2143    Hypomagnesmia - Magnesium goal> 2 - 8/2 Magnesium IV 3 g   Goals of care - 8/2 consult [facility] SNF placement    Mobility Assessment (last 72 hours)     Mobility Assessment     Row Name 05/02/22 2050 05/02/22 0952 05/02/22 0914 05/01/22 1600 05/01/22 1000   Does patient have an order for bedrest or is patient medically unstable No - Continue assessment -- No - Continue assessment -- No - Continue assessment   What is the highest level of mobility based on the progressive mobility assessment? Level 4 (Walks with assist in room) - Balance while marching in place and cannot step forward and back - Complete Level 4 (Walks with assist in room) - Balance while marching in place and cannot step forward and back - Complete Level 4 (Walks with assist in room) - Balance while marching in place and cannot step forward and back - Complete Level 4 (Walks with assist in room) - Balance while marching in place and cannot step forward and back - Complete Level 4 (Walks with assist in room) - Balance while marching in place and cannot step forward and back - Complete   Is the above level different from baseline mobility prior to current illness? -- -- Yes - Recommend PT order -- Yes - Recommend PT order     [place] Name 04/30/22 2300 04/30/22 1419         Does patient have an order for bedrest or is patient medically unstable No - Continue assessment No - Continue assessment      What is the highest level of mobility based on the progressive mobility assessment? Level 4 (Walks with assist in room) - Balance while marching in place and cannot step forward and back - Complete Level 4 (Walks with assist in room) - Balance while marching in place and cannot step forward and back - Complete      Is the above level different from baseline mobility prior to current illness? Yes - Recommend PT order Yes - Recommend PT order                       DVT prophylaxis: Subcu heparin Code Status: Full Family Communication:  Status is: Inpatient    Dispo: The patient is from: Home              Anticipated d/c is to: SNF              Anticipated d/c date is: 3 days              Patient currently is not medically stable to d/c.  Consultants:    Procedures/Significant Events:    I have personally reviewed and interpreted all radiology studies and my findings are as above.  VENTILATOR SETTINGS:    Cultures   Antimicrobials: Anti-infectives (From admission, onward)    Start     Dose/Rate Route Frequency Ordered Stop   04/28/22 1400  amoxicillin-clavulanate (AUGMENTIN) 875-125 MG per tablet 1 tablet        1 tablet Oral Every 12 hours 04/28/22 1139 04/30/22 2139   04/25/22 1800  Ampicillin-Sulbactam (UNASYN) 3 g in sodium chloride 0.9 % 100 mL IVPB  Status:  Discontinued        3 g 200 mL/hr over 30 Minutes Intravenous Every 6 hours 04/25/22 1748 04/28/22 1139         Devices    LINES / TUBES:      Continuous Infusions:   Objective: Vitals:   05/02/22 1332 05/02/22 1333 05/02/22 2039 05/03/22 0643  BP: (!) 103/59 (!) 103/59 110/67 105/70  Pulse: 76 76 76 79  Resp: 16  16 16   Temp: 98.4 F (36.9 C) 98.4 F (36.9 C) 97.7 F (36.5 C) 97.9 F (36.6 C)  TempSrc:  Oral Oral Oral Oral  SpO2: 100% 100% 99% 99%  Weight:      Height:        Intake/Output Summary (Last 24 hours) at 05/03/2022 [id number] Last data filed at 05/03/2022 0600 Gross per 24 hour  Intake 1369.22 ml  Output 1750 ml  Net -380.78 ml   Filed Weights   04/25/22 1403  Weight: 83.9 kg    Examination:  General: A/O x4, No acute respiratory distress Eyes: negative scleral hemorrhage, negative anisocoria, negative icterus ENT: Negative Runny nose, negative gingival bleeding, Neck:  Negative scars, masses, torticollis, lymphadenopathy, JVD Lungs: Clear to auscultation bilaterally without wheezes or crackles Cardiovascular: Regular rate and rhythm without murmur gallop or rub normal S1 and S2 Abdomen: negative abdominal pain, nondistended, positive soft, bowel sounds, no rebound, no ascites, no appreciable mass Extremities: No significant cyanosis, clubbing, or edema bilateral lower extremities Skin: Negative rashes, lesions, ulcers Psychiatric:  Negative depression, negative anxiety, negative fatigue, negative mania  Central nervous system:  Cranial nerves II through XII intact, tongue/uvula midline, all extremities muscle strength 5/5, sensation intact throughout, negative dysarthria, negative expressive aphasia, negative receptive aphasia.  .     Data Reviewed: Care during the described time interval was provided by me .  I have reviewed this patient's available data, including medical history, events of note, physical examination, and all test results as part of my evaluation.  CBC: Recent Labs  Lab 04/27/22 0103 04/28/22 0358 04/29/22 0422 04/30/22 0346 05/01/22 0355  WBC 9.8 8.0 7.0 6.9 8.2  HGB 7.3* 7.5* 7.7* 8.0* 7.6*  HCT 21.6* 22.3* 22.8* 24.1* 23.1*  MCV 98.2 99.1 101.3* 101.7* 101.3*  PLT 132* 129* 118* 129* [id number]*   Basic Metabolic Panel: Recent Labs  Lab 04/28/22 0358 04/29/22 0422 04/30/22 0346 05/01/22 0355 05/03/22 0333  NA 132* 133* 133* 131* 133*  K  3.6 3.6 3.3* 3.7 3.6  CL 100 104 102 104 105  CO2 23 22 23  20* 19*  GLUCOSE 100* 97 94 99 104*  BUN 5* 9 9 8 8   CREATININE 0.58* 0.55* 0.59* 0.44* 0.51*  CALCIUM 8.3* 8.1* 8.2* 8.1* 7.9*  MG 1.4* 1.6* 1.8  --   --   PHOS 2.4*  --   --   --   --    GFR: Estimated Creatinine Clearance: 114.5  mL/min (A) (by C-G formula based on SCr of 0.51 mg/dL (L)). Liver Function Tests: Recent Labs  Lab 04/28/22 0358  AST 81*  ALT 47*  ALKPHOS 136*  BILITOT 1.3*  PROT 6.1*  ALBUMIN 2.1*   No results for input(s): "LIPASE", "AMYLASE" in the last 168 hours. No results for input(s): "AMMONIA" in the last 168 hours. Coagulation Profile: No results for input(s): "INR", "PROTIME" in the last 168 hours. Cardiac Enzymes: No results for input(s): "CKTOTAL", "CKMB", "CKMBINDEX", "TROPONINI" in the last 168 hours. BNP (last 3 results) No results for input(s): "PROBNP" in the last 8760 hours. HbA1C: No results for input(s): "HGBA1C" in the last 72 hours. CBG: No results for input(s): "GLUCAP" in the last 168 hours. Lipid Profile: No results for input(s): "CHOL", "HDL", "LDLCALC", "TRIG", "CHOLHDL", "LDLDIRECT" in the last 72 hours. Thyroid Function Tests: No results for input(s): "TSH", "T4TOTAL", "FREET4", "T3FREE", "THYROIDAB" in the last 72 hours. Anemia Panel: No results for input(s): "VITAMINB12", "FOLATE", "FERRITIN", "TIBC", "IRON", "RETICCTPCT" in the last 72 hours. Sepsis Labs: Recent Labs  Lab 04/26/22 1530  LATICACIDVEN 1.0    Recent Results (from the past 240 hour(s))  Blood Culture (routine x 2)     Status: None   Collection Time: 04/25/22  2:44 PM   Specimen: Site Not Specified; Blood  Result Value Ref Range Status   Specimen Description   Final    SITE NOT SPECIFIED Performed at [facility], [address], [place], [place] [address]    Special Requests   Final    BOTTLES DRAWN AEROBIC AND ANAEROBIC Blood Culture adequate volume Performed at [facility], [address] [place], [place] [address]    Culture   Final    NO GROWTH 5 DAYS Performed at [facility], [address], [place], [place] [address]    Report Status 04/30/2022 FINAL  Final  Blood Culture (routine x 2)     Status: None   Collection Time: 04/25/22  2:56 PM   Specimen: BLOOD LEFT HAND  Result Value Ref Range Status   Specimen Description   Final    BLOOD LEFT HAND Performed at [facility], [address] [place], [place] [address]    Special Requests   Final    BOTTLES DRAWN AEROBIC ONLY Blood Culture results may not be optimal due to an inadequate volume of blood received in culture bottles Performed at [facility], [address] [place], [place] [address]    Culture   Final    NO GROWTH 5 DAYS Performed at [facility], [address], [place], [place] [address]    Report Status 04/30/2022 FINAL  Final  SARS Coronavirus 2 by RT PCR (hospital order, performed in [facility] hospital lab) *cepheid single result test* Anterior Nasal Swab     Status: None   Collection Time: 04/25/22  5:04 PM   Specimen: Anterior Nasal Swab  Result Value Ref Range Status   SARS Coronavirus 2 by RT PCR NEGATIVE NEGATIVE Final    Comment: (NOTE) SARS-CoV-2 target nucleic acids are NOT DETECTED.  The SARS-CoV-2 RNA is generally detectable in upper and lower respiratory specimens during the acute phase of infection. The lowest concentration of SARS-CoV-2 viral copies this assay can detect is 250 copies / mL. A negative result does not preclude SARS-CoV-2 infection and should not be used as the sole basis for treatment or other patient management decisions.  A negative  result may occur with improper specimen collection / handling, submission of specimen other than nasopharyngeal swab, presence of viral mutation(s) within the areas targeted by this assay, and inadequate number of viral copies (<250  copies / mL). A negative result must be combined with clinical observations, patient history, and epidemiological information.  Fact Sheet for Patients:   RoadLapTop.co.za  Fact Sheet for Healthcare Providers: http://kim-miller.com/  This test is not yet approved or  cleared by the [place] FDA and has been authorized for detection and/or diagnosis of SARS-CoV-2 by FDA under an Emergency Use Authorization (EUA).  This EUA will remain in effect (meaning this test can be used) for the duration of the COVID-19 declaration under Section 564(b)(1) of the Act, 21 U.S.C. section 360bbb-3(b)(1), unless the authorization is terminated or revoked sooner.  Performed at [facility], [address], [place], [place] [address]   C Difficile Quick Screen w PCR reflex     Status: None   Collection Time: 04/26/22  6:00 PM   Specimen: STOOL  Result Value Ref Range Status   C Diff antigen NEGATIVE NEGATIVE Final   C Diff toxin NEGATIVE NEGATIVE Final   C Diff interpretation No C. difficile detected.  Final    Comment: Performed at [facility], [address], [place], [place] [address]  Gastrointestinal Panel by PCR , Stool     Status: Abnormal   Collection Time: 04/26/22  6:00 PM   Specimen: STOOL  Result Value Ref Range Status   Campylobacter species NOT DETECTED NOT DETECTED Final   Plesimonas shigelloides NOT DETECTED NOT DETECTED Final   Salmonella species NOT DETECTED NOT DETECTED Final   Yersinia enterocolitica NOT DETECTED NOT DETECTED Final   Vibrio species NOT DETECTED NOT DETECTED Final   Vibrio cholerae NOT DETECTED NOT DETECTED Final   Enteroaggregative E coli (EAEC) NOT DETECTED NOT DETECTED Final   Enteropathogenic E coli (EPEC) DETECTED (A) NOT DETECTED Final    Comment: RESULT CALLED TO, READ BACK BY AND VERIFIED WITH: DERRICK RICHTER 04/27/22 1028 KLW    Enterotoxigenic E coli (ETEC) NOT  DETECTED NOT DETECTED Final   Shiga like toxin producing E coli (STEC) NOT DETECTED NOT DETECTED Final   Shigella/Enteroinvasive E coli (EIEC) NOT DETECTED NOT DETECTED Final   Cryptosporidium NOT DETECTED NOT DETECTED Final   Cyclospora cayetanensis NOT DETECTED NOT DETECTED Final   Entamoeba histolytica NOT DETECTED NOT DETECTED Final   Giardia lamblia NOT DETECTED NOT DETECTED Final   Adenovirus F40/41 NOT DETECTED NOT DETECTED Final   Astrovirus NOT DETECTED NOT DETECTED Final   Norovirus GI/GII NOT DETECTED NOT DETECTED Final   Rotavirus A NOT DETECTED NOT DETECTED Final   Sapovirus (I, II, IV, and V) NOT DETECTED NOT DETECTED Final    Comment: Performed at [facility], [address] [place], [place] [address]         Radiology Studies: No results found.      Scheduled Meds:  vitamin C  500 mg Oral BID   feeding supplement  237 mL Oral TID BM   ferrous sulfate  325 mg Oral BID WC   folic acid  1 mg Oral Daily   multivitamin with minerals  1 tablet Oral Daily   thiamine  100 mg Oral Daily   Continuous Infusions:   LOS: 8 days    Time spent:40 min    Frutoso Dimare, Roselind Messier, MD [facility]   If 7PM-7AM, please contact night-coverage 05/03/2022, 7:28 AM

## 2022-05-03 NOTE — Progress Notes (Signed)
Nutrition Follow-up  DOCUMENTATION CODES:   Not applicable  INTERVENTION:  - continue Ensure TID. - will enter smart phrase in AVS for patient to continue oral nutrition supplement BID for at least 1 month after d/c.  - re-weigh today.    NUTRITION DIAGNOSIS:   Inadequate oral intake related to acute illness as evidenced by per patient/family report. -resolving.  GOAL:   Patient will meet greater than or equal to 90% of their needs -met on average  MONITOR:   PO intake, Supplement acceptance, Labs, Weight trends, Skin, I & O's  ASSESSMENT:   56 y/o male with h/o etoh abuse, cirrhosis, hepatitis and compression fracture who is admitted with E-coli diarrhea, rib fractures, encephalopathy and aspiration PNA.  He has been eating 75-100% at all meals since lunch on 7/30 to include 90% of breakfast, 100% of lunch, and 100% of dinner yesterday.   He has been accepting Ensure 75% of the time offered.   He has not been weighed since admission on 7/25 at which time weight was documented as 185 lb and appears to be a stated weight. Mild pitting edema to BLE documented in the edema section of flow sheet.   Per notes: - E.coli diarrhea--diarrhea has now resolved - hypovolemic hyponatremia - hypokalemia, hypomagnesemia, hypophosphatemia--resolved s/p supplementation - concern for aspiration PNA on admission--abx course completed - alcohol abuse (12 beers/day) with withdrawal this admission - medically stable, pending SNF availability   Labs reviewed; Na: 133 mmol/l, creatinine: 0.51 mg/dl, Ca: 7.9 mg/dl.   Medications reviewed; 500 mg ascorbic acid BID, 325 mg ferrous sulfate BID, 1 mg folvite/day, 1 tablet multivitamin with minerals/day, 100 mg oral thiamine/day.     Diet Order:   Diet Order             Diet regular Room service appropriate? Yes; Fluid consistency: Thin  Diet effective now                   EDUCATION NEEDS:   No education needs have been identified  at this time  Skin:  Skin Assessment: Skin Integrity Issues: Skin Integrity Issues:: Other (Comment), Stage II, DTI DTI: L shoulder Stage II: mid-back Other: non-pressure injury to low back  Last BM:  8/1 (type 6 x3, all large amounts)  Height:   Ht Readings from Last 1 Encounters:  04/25/22 6' (1.829 m)    Weight:   Wt Readings from Last 1 Encounters:  04/25/22 83.9 kg     BMI:  Body mass index is 25.09 kg/m.  Estimated Nutritional Needs:  Kcal:  2200-2500kcal/day Protein:  110-125g/day Fluid:  2.2-2.5L/day     Jarome Matin, MS, RD, LDN, CNSC Registered Dietitian II Inpatient Clinical Nutrition RD pager # and on-call/weekend pager # available in [place]

## 2022-05-03 NOTE — TOC Progression Note (Addendum)
Transition of Care [facility] - Progression Note    Patient Details  Name: Dylan Weaver MRN: [id number] Date of Birth: January 02, 1966  Transition of [facility]) CM/SW Contact  Golda Acre, RN Phone Number: 05/03/2022, 2:09 PM  Clinical Narrative:    Tct-[place] [facility]Shay with Dr. [name] who is the primary care.  Referral will be given to the case worker and md and they will call back or the nurse here can call back. Tct-April with the [facility]-requested call back to clarify steps for getting this patient to a snf. My telephone number left on voice mail. [phone] at [facility] will send packet for snf admission in my email. Expected Discharge Plan: Home/Self Care Barriers to Discharge: Continued Medical Work up  Expected Discharge Plan and Services Expected Discharge Plan: Home/Self Care   Discharge Planning Services: CM Consult   Living arrangements for the past 2 months: Single Family Home                                       Social Determinants of Health (SDOH) Interventions    Readmission Risk Interventions     No data to display

## 2022-05-03 NOTE — Discharge Instructions (Signed)
Nutrition Post Hospital Stay Proper nutrition can help your body recover from illness and injury.   Foods and beverages high in protein, vitamins, and minerals help rebuild muscle loss, promote healing, & reduce fall risk.   In addition to eating healthy foods, a nutrition shake is an easy, delicious way to get the nutrition you need during and after your hospital stay  It is recommended that you continue to drink 2 bottles per day of: Ensure Plus or similar for at least 1 month (30 days) after your hospital stay   Tips for adding a nutrition shake into your routine: As allowed, drink one with vitamins or medications instead of water or juice Enjoy one as a tasty mid-morning or afternoon snack Drink cold or make a milkshake out of it Drink one instead of milk with cereal or snacks Use as a coffee creamer   Available at the following grocery stores and pharmacies:           * [employer] * [employer] * [employer]co  * Rite Aid          * Walmart * [employer]  * Walgreens      * Target  * BJ's   * CVS  * [employer]   * [facility] [phone]            For COUPONS visit: www.[email or web address]/join or [email or web address]   Suggested Substitutions Ensure Plus = Boost Plus = Carnation Breakfast Essentials = Boost Compact Ensure Active Clear = Boost Breeze Glucerna Shake = Boost Glucose Control = Carnation Breakfast Essentials SUGAR FREE     

## 2022-05-03 NOTE — Progress Notes (Signed)
Physical Therapy Treatment Patient Details Name: Dylan Weaver MRN: [id number] DOB: 1966/03/17 Today's Date: 05/03/2022   History of Present Illness 56 y.o.m w/ history of alcohol abuse, previous history of alcoholic hepatitis brought into the ED by EMS after neighbor found the patient lying on the floor covered in feces and surrounded by empty beer cans.  Patient apparently reported to EMS that he fell 2 days prior to presentation. He was confused and history is very limited.    PT Comments    General Comments: AxO x 3 impulsive, honary.  Familiar from prior admit.  Lives home alone.  Still coping with loss of his Mother "about a year now".  Drinks beer in excess.  Poor self care.  Required Max encouragement to participate. Assisted OOB to amb to bathroom was difficult.  General bed mobility comments: pt able to self sit EOB but was using excessive B UE's to support self. General transfer comment: VERY unsteady.  Mod Assist to rise from elevated bed but needed Max Assist to rise from lower toilet level.  Pt also with posterior LOB "fall" onto low toilet in which Therapist recovered using safety belt.  Esp unsteady with turns and back steps with another near fall stepping backward off standing scale.  General Gait Details: limited amb distance to and from bathroom 11 feet X 2 with poor balance, poor posture, impulsive, VERY "drunken" gait.  HIGH FALL RISK.  Near fall twice during session. Max c/o weakness/fatigue. Pt will need ST Rehab at SNF to address mobility and functional decline prior to safely returning home. Pt did have a large formed LIGHT GREY STOOL. Reported to RN.  Recommendations for follow up therapy are one component of a multi-disciplinary discharge planning process, led by the attending physician.  Recommendations may be updated based on patient status, additional functional criteria and insurance authorization.  Follow Up Recommendations  Skilled nursing-short term rehab (<3  hours/day) Can patient physically be transported by private vehicle: No   Assistance Recommended at Discharge    Patient can return home with the following A lot of help with walking and/or transfers;A lot of help with bathing/dressing/bathroom;Assist for transportation;Assistance with cooking/housework   Equipment Recommendations  None recommended by PT    Recommendations for Other Services       Precautions / Restrictions Precautions Precautions: Fall Precaution Comments: Impulsive Restrictions Weight Bearing Restrictions: No     Mobility  Bed Mobility Overal bed mobility: Needs Assistance Bed Mobility: Supine to Sit Rolling: Supervision         General bed mobility comments: pt able to self sit EOB but was using excessive B UE's to support self.    Transfers Overall transfer level: Needs assistance Equipment used: Rolling walker (2 wheels) Transfers: Sit to/from Stand Sit to Stand: Mod assist, Max assist, +2 physical assistance, +2 safety/equipment           General transfer comment: VERY unsteady.  Mod Assist to rise from elevated bed but needed Max Assist to rise from lower toilet level.  Pt also with posterior LOB "fall" onto low toilet in which Therapist recovered using safety belt.  Esp unsteady with turns and back steps with another near fall stepping backward off standing scalr.    Ambulation/Gait Ambulation/Gait assistance: Min assist, Mod assist, +2 physical assistance, +2 safety/equipment Gait Distance (Feet): 22 Feet Assistive device: Rolling walker (2 wheels) Gait Pattern/deviations: Step-to pattern, Wide base of support Gait velocity: decreased     General Gait Details: limited amb distance  to and from bathroom 11 feet X 2 with poor balance, poor posture, impulsive, VERY "drunken" gait.  HIGH FALL RISK.  Near fall twice during session. Max c/o weakness/fatigue.   Stairs             Wheelchair Mobility    Modified Rankin (Stroke  Patients Only)       Balance                                            Cognition Arousal/Alertness: Awake/alert Behavior During Therapy: WFL for tasks assessed/performed Overall Cognitive Status: Within Functional Limits for tasks assessed                                 General Comments: AxO x 3 impulsive, honary.  Familiar from prior admit.  Lives home alone.  Still coping with loss of his Mother "about a year now".  Drink beer in excess.  Required Max encouragement to participate.        Exercises      General Comments        Pertinent Vitals/Pain Pain Assessment Pain Assessment: No/denies pain    Home Living                          Prior Function            PT Goals (current goals can now be found in the care plan section) Progress towards PT goals: Progressing toward goals    Frequency    Min 3X/week      PT Plan Current plan remains appropriate    Co-evaluation              AM-PAC PT "6 Clicks" Mobility   Outcome Measure  Help needed turning from your back to your side while in a flat bed without using bedrails?: A Lot Help needed moving from lying on your back to sitting on the side of a flat bed without using bedrails?: A Lot Help needed moving to and from a bed to a chair (including a wheelchair)?: A Lot Help needed standing up from a chair using your arms (e.g., wheelchair or bedside chair)?: A Lot Help needed to walk in hospital room?: A Lot   6 Click Score: 10    End of Session  Equipment Utilized During Treatment: Gait belt Activity Tolerance: Patient tolerated treatment well Patient left: in bed;with call bell/phone within reach Nurse Communication: Mobility status PT Visit Diagnosis: Difficulty in walking, not elsewhere classified (R26.2);Muscle weakness (generalized) (M62.81)     Time: 1607-3710 PT Time Calculation (min) (ACUTE ONLY): 26 min  Charges:  $Gait Training: 8-22  mins $Therapeutic Activity: 8-22 mins                     Felecia Shelling  PTA Acute  Rehabilitation Services Office M-F          [phone] Weekend pager [phone]

## 2022-05-04 DIAGNOSIS — G9341 Metabolic encephalopathy: Secondary | ICD-10-CM | POA: Diagnosis not present

## 2022-05-04 DIAGNOSIS — F1020 Alcohol dependence, uncomplicated: Secondary | ICD-10-CM | POA: Diagnosis not present

## 2022-05-04 DIAGNOSIS — E871 Hypo-osmolality and hyponatremia: Secondary | ICD-10-CM | POA: Diagnosis not present

## 2022-05-04 DIAGNOSIS — F1010 Alcohol abuse, uncomplicated: Secondary | ICD-10-CM | POA: Diagnosis not present

## 2022-05-04 LAB — COMPREHENSIVE METABOLIC PANEL
ALT: 32 U/L (ref 0–44)
AST: 42 U/L — ABNORMAL HIGH (ref 15–41)
Albumin: 2.1 g/dL — ABNORMAL LOW (ref 3.5–5.0)
Alkaline Phosphatase: 145 U/L — ABNORMAL HIGH (ref 38–126)
Anion gap: 6 (ref 5–15)
BUN: 8 mg/dL (ref 6–20)
CO2: 21 mmol/L — ABNORMAL LOW (ref 22–32)
Calcium: 7.6 mg/dL — ABNORMAL LOW (ref 8.9–10.3)
Chloride: 106 mmol/L (ref 98–111)
Creatinine, Ser: 0.44 mg/dL — ABNORMAL LOW (ref 0.61–1.24)
GFR, Estimated: >60 mL/min (ref >60)
Glucose, Bld: 110 mg/dL — ABNORMAL HIGH (ref 70–99)
Potassium: 3.4 mmol/L — ABNORMAL LOW (ref 3.5–5.1)
Sodium: 133 mmol/L — ABNORMAL LOW (ref 135–145)
Total Bilirubin: 0.8 mg/dL (ref 0.3–1.2)
Total Protein: 6.3 g/dL — ABNORMAL LOW (ref 6.5–8.1)

## 2022-05-04 LAB — CBC WITH DIFFERENTIAL/PLATELET
Abs Immature Granulocytes: 0.04 10*3/uL (ref 0.00–0.07)
Basophils Absolute: 0.1 10*3/uL (ref 0.0–0.1)
Basophils Relative: 1 %
Eosinophils Absolute: 0.3 10*3/uL (ref 0.0–0.5)
Eosinophils Relative: 3 %
HCT: 26 % — ABNORMAL LOW (ref 39.0–52.0)
Hemoglobin: 8.5 g/dL — ABNORMAL LOW (ref 13.0–17.0)
Immature Granulocytes: 1 %
Lymphocytes Relative: 22 %
Lymphs Abs: 1.7 10*3/uL (ref 0.7–4.0)
MCH: 33.9 pg (ref 26.0–34.0)
MCHC: 32.7 g/dL (ref 30.0–36.0)
MCV: 103.6 fL — ABNORMAL HIGH (ref 80.0–100.0)
Monocytes Absolute: 0.4 10*3/uL (ref 0.1–1.0)
Monocytes Relative: 5 %
Neutro Abs: 5.4 10*3/uL (ref 1.7–7.7)
Neutrophils Relative %: 68 %
Platelets: 314 10*3/uL (ref 150–400)
RBC: 2.51 MIL/uL — ABNORMAL LOW (ref 4.22–5.81)
RDW: 16.6 % — ABNORMAL HIGH (ref 11.5–15.5)
WBC: 7.8 10*3/uL (ref 4.0–10.5)
nRBC: 0 % (ref 0.0–0.2)

## 2022-05-04 LAB — AMMONIA: Ammonia: 25 umol/L (ref 9–35)

## 2022-05-04 LAB — PHOSPHORUS: Phosphorus: 2.8 mg/dL (ref 2.5–4.6)

## 2022-05-04 LAB — MAGNESIUM: Magnesium: 1.9 mg/dL (ref 1.7–2.4)

## 2022-05-04 MED ORDER — POTASSIUM CHLORIDE CRYS ER 20 MEQ PO TBCR
50.0000 meq | EXTENDED_RELEASE_TABLET | Freq: Once | ORAL | Status: AC
  Administered 2022-05-04: 50 meq via ORAL
  Filled 2022-05-04: qty 1

## 2022-05-04 NOTE — Progress Notes (Signed)
PROGRESS NOTE    Dylan Weaver  [id number] DOB: 12-20-1965 DOA: 04/25/2022 PCP: [facility]     Brief Narrative:  56 y.o.WM PMHx EtOH abuse, Hx EtOH Hepatitis, prolonged QT interval, pressure ulcer,  Brought into the ED by EMS after neighbor found the patient lying on the floor covered in feces and surrounded by empty beer cans.  Patient apparently reported to EMS that he fell 2 days prior to presentation. He was confused and history is very limited.  He had diarrhea and GI panel came back positive for enteropathogenic E. coli and resolved without treatment he had hypovolemic hyponatremia dehydration that resolved with IV fluids.  His encephalopathy has significantly improved.  He remains weak and deconditioned PT OT recommend skilled nursing facility placement      Subjective: 8/3 A/O x4, negative abdominal pain, negative SOB.  States she is trying to decide if he would rather be discharged or await SNF placement.  We will give [place] a decision by the A.m.      Assessment & Plan: Covid vaccination;   Principal Problem:   Hyponatremia Active Problems:   Alcohol use disorder with alcohol withdrawal    Thrombocytopenia (HCC)   Normochromic normocytic anemia   Elevated LFTs   Prolonged QT interval   Pressure ulcer   Acute metabolic encephalopathy   Hypokalemia   Aspiration pneumonia (HCC)   Enteropathogenic Escherichia coli infection   Hypophosphatemia  Enteropathogenic E. coli diarrhea: diarrhea resolved.   -enteric precaution  -continue diet   Hypovolemic hyponatremia Lab Results  Component Value Date   NA 133 (L) 05/04/2022   NA 131 (L) 05/03/2022   NA 133 (L) 05/03/2022   NA 131 (L) 05/01/2022   NA 133 (L) 04/30/2022  -Resolving   Dehydration/ketosis in urine: -resume IVF for today as BP low as well -A/3 resolved     Aspiration pneumonia with CT showing-left-sided groundglass opacities  -cxr neg. -Likely aspirated. -changed unasyn  to augmentin -Completed course antibiotics.   Transaminitis/abnormal lfts:  -likely etoh related.likely secondary to his alcohol abuse. CT scan showed cholelithiasis without evidence of cholecystitis.Hepatic steatosis noted.  Acute hepatitis panel negative.  AST/ALT 69/50 T. bili2.7 > downtrending.     Acute metabolic encephalopathy:   likely due to metabolic derangements/alcoholism/ aspiration.non focal on exam. CT head- non acute-ammonia level normal.  Due to his alcohol abuse history started on high-dose thiamine,ciwa ativan, etoh cessation counseling.  Mental status has improved.  PTOT eval-advised skilled nursing facility -H/3 resolved   Alcohol abuse with alcohol withdrawal:  -Last alcohol use unknown patient reports 2 days PTA, drinks at least 12 beers on daily basis, possibly liquid.  -Continue high-dose thiamine, folic acid, CIWA scale Ativan prn. TOC consult for cessation.   Lactic acidosis - 7/26 resolved     Old healed left rib fractures/old clavicle fracture/chronic L1 compression fracture : Respiratory status is stable, no chest pain.  Continue incentive spirometry, pain control.     Normocytic anemia Folic acid deficiency Thrombocytopenia: Suspect multifactorial hemodilution, anemia of chronic disease.  No active bleeding noted Hemoccult was negative.B12 is normal, iron normal 53 ferritin high 913.Last colonoscopy couple of years ago he says and was " okay" per him. Patient will need extensive follow-up with GI PCP and age-appropriate cancer screening as outpatient.  - po iron   Cholelithiasis:  -Noted incidentally on CT scan -No cholecystitis. Monitor.   QT prolongation:  -Likely from electrolyte imbalance repleting electrolytes aggressively.monitor in tele.    Deconditioning/debility  -Obtain  PT OT eval, encourage ambulation.  SNF pending   Pressure ulcer POA on the back stage II on the left shoulder DTI  Pressure Injury 04/26/22 Back Mid Stage 2 -  Partial  thickness loss of dermis presenting as a shallow open injury with a red, pink wound bed without slough. (Active)  04/26/22 1517  Location: Back  Location Orientation: Mid  Staging: Stage 2 -  Partial thickness loss of dermis presenting as a shallow open injury with a red, pink wound bed without slough.  Wound Description (Comments):   Present on Admission: Yes  Dressing Type Foam - Lift dressing to assess site every shift 05/04/22 0800     Pressure Injury 04/27/22 Shoulder Left;Posterior Deep Tissue Pressure Injury - Purple or maroon localized area of discolored intact skin or blood-filled blister due to damage of underlying soft tissue from pressure and/or shear. (Active)  04/27/22   Location: Shoulder  Location Orientation: Left;Posterior  Staging: Deep Tissue Pressure Injury - Purple or maroon localized area of discolored intact skin or blood-filled blister due to damage of underlying soft tissue from pressure and/or shear.  Wound Description (Comments):   Present on Admission: Yes  Dressing Type None 05/04/22 0800    Hypomagnesmia - Magnesium goal> 2 - 8/2 Magnesium IV 3 g  Hypocalcemia  Hypokalemia - Potassium goal> 4 - 8/3 K-Dur 50 mEq   Goals of care - 8/2 consult [facility] SNF placement    Mobility Assessment (last 72 hours)     Mobility Assessment     Row Name 05/04/22 0800 05/03/22 2046 05/03/22 2035 05/03/22 1247 05/03/22 0753   Does patient have an order for bedrest or is patient medically unstable No - Continue assessment No - Continue assessment No - Continue assessment -- No - Continue assessment   What is the highest level of mobility based on the progressive mobility assessment? Level 4 (Walks with assist in room) - Balance while marching in place and cannot step forward and back - Complete Level 4 (Walks with assist in room) - Balance while marching in place and cannot step forward and back - Complete Level 4 (Walks with assist in room) - Balance while marching in  place and cannot step forward and back - Complete Level 4 (Walks with assist in room) - Balance while marching in place and cannot step forward and back - Complete Level 4 (Walks with assist in room) - Balance while marching in place and cannot step forward and back - Complete   Is the above level different from baseline mobility prior to current illness? Yes - Recommend PT order -- -- -- Yes - Recommend PT order    Row Name 05/02/22 2050 05/02/22 6010 05/02/22 0914       Does patient have an order for bedrest or is patient medically unstable No - Continue assessment -- No - Continue assessment     What is the highest level of mobility based on the progressive mobility assessment? Level 4 (Walks with assist in room) - Balance while marching in place and cannot step forward and back - Complete Level 4 (Walks with assist in room) - Balance while marching in place and cannot step forward and back - Complete Level 4 (Walks with assist in room) - Balance while marching in place and cannot step forward and back - Complete     Is the above level different from baseline mobility prior to current illness? -- -- Yes - Recommend PT order  DVT prophylaxis: Subcu heparin Code Status: Full Family Communication:  Status is: Inpatient    Dispo: The patient is from: Home              Anticipated d/c is to: SNF              Anticipated d/c date is: 3 days              Patient currently is not medically stable to d/c.      Consultants:    Procedures/Significant Events:    I have personally reviewed and interpreted all radiology studies and my findings are as above.  VENTILATOR SETTINGS:    Cultures   Antimicrobials: Anti-infectives (From admission, onward)    Start     Dose/Rate Route Frequency Ordered Stop   04/28/22 1400  amoxicillin-clavulanate (AUGMENTIN) 875-125 MG per tablet 1 tablet        1 tablet Oral Every 12 hours 04/28/22 1139 04/30/22 2139    04/25/22 1800  Ampicillin-Sulbactam (UNASYN) 3 g in sodium chloride 0.9 % 100 mL IVPB  Status:  Discontinued        3 g 200 mL/hr over 30 Minutes Intravenous Every 6 hours 04/25/22 1748 04/28/22 1139         Devices    LINES / TUBES:      Continuous Infusions:   Objective: Vitals:   05/03/22 2047 05/03/22 2250 05/04/22 0051 05/04/22 1518  BP: (!) 88/52 95/63 112/68 (!) 91/54  Pulse: 79 69 84 79  Resp: 18   20  Temp: 98.8 F (37.1 C)   98.2 F (36.8 C)  TempSrc: Oral   Oral  SpO2: 98%   100%  Weight:      Height:        Intake/Output Summary (Last 24 hours) at 05/04/2022 1613 Last data filed at 05/04/2022 1400 Gross per 24 hour  Intake 920.83 ml  Output 1450 ml  Net -529.17 ml    Filed Weights   04/25/22 1403 05/03/22 1200  Weight: 83.9 kg 76.1 kg    Examination:  General: A/O x4, No acute respiratory distress Eyes: negative scleral hemorrhage, negative anisocoria, negative icterus ENT: Negative Runny nose, negative gingival bleeding, Neck:  Negative scars, masses, torticollis, lymphadenopathy, JVD Lungs: Clear to auscultation bilaterally without wheezes or crackles Cardiovascular: Regular rate and rhythm without murmur gallop or rub normal S1 and S2 Abdomen: negative abdominal pain, nondistended, positive soft, bowel sounds, no rebound, no ascites, no appreciable mass Extremities: No significant cyanosis, clubbing, or edema bilateral lower extremities Skin: Negative rashes, lesions, ulcers Psychiatric:  Negative depression, negative anxiety, negative fatigue, negative mania  Central nervous system:  Cranial nerves II through XII intact, tongue/uvula midline, all extremities muscle strength 5/5, sensation intact throughout, negative dysarthria, negative expressive aphasia, negative receptive aphasia.  .     Data Reviewed: Care during the described time interval was provided by me .  I have reviewed this patient's available data, including medical history,  events of note, physical examination, and all test results as part of my evaluation.  CBC: Recent Labs  Lab 04/29/22 0422 04/30/22 0346 05/01/22 0355 05/03/22 1257 05/04/22 0337  WBC 7.0 6.9 8.2 7.7 7.8  NEUTROABS  --   --   --  5.2 5.4  HGB 7.7* 8.0* 7.6* 9.0* 8.5*  HCT 22.8* 24.1* 23.1* 27.0* 26.0*  MCV 101.3* 101.7* 101.3* 101.9* 103.6*  PLT 118* 129* 149* 306 [id number]    Basic Metabolic Panel: Recent Labs  Lab 04/28/22  [id number] 04/29/22 0422 04/30/22 0346 05/01/22 0355 05/03/22 0333 05/03/22 1257 05/04/22 0337  NA 132* 133* 133* 131* 133* 131* 133*  K 3.6 3.6 3.3* 3.7 3.6 3.7 3.4*  CL 100 104 102 104 105 102 106  CO2 23 22 23  20* 19* 22 21*  GLUCOSE 100* 97 94 99 104* 101* 110*  BUN 5* 9 9 8 8 9 8   CREATININE 0.58* 0.55* 0.59* 0.44* 0.51* 0.46* 0.44*  CALCIUM 8.3* 8.1* 8.2* 8.1* 7.9* 8.0* 7.6*  MG 1.4* 1.6* 1.8  --   --  1.2* 1.9  PHOS 2.4*  --   --   --   --  3.4 2.8    GFR: Estimated Creatinine Clearance: 112.3 mL/min (A) (by C-G formula based on SCr of 0.44 mg/dL (L)). Liver Function Tests: Recent Labs  Lab 04/28/22 0358 05/03/22 1257 05/04/22 0337  AST 81* 56* 42*  ALT 47* 32 32  ALKPHOS 136* 157* 145*  BILITOT 1.3* 1.1 0.8  PROT 6.1* 6.4* 6.3*  ALBUMIN 2.1* 2.1* 2.1*    No results for input(s): "LIPASE", "AMYLASE" in the last 168 hours. Recent Labs  Lab 05/03/22 1257 05/04/22 0337  AMMONIA 28 25   Coagulation Profile: No results for input(s): "INR", "PROTIME" in the last 168 hours. Cardiac Enzymes: No results for input(s): "CKTOTAL", "CKMB", "CKMBINDEX", "TROPONINI" in the last 168 hours. BNP (last 3 results) No results for input(s): "PROBNP" in the last 8760 hours. HbA1C: No results for input(s): "HGBA1C" in the last 72 hours. CBG: No results for input(s): "GLUCAP" in the last 168 hours. Lipid Profile: No results for input(s): "CHOL", "HDL", "LDLCALC", "TRIG", "CHOLHDL", "LDLDIRECT" in the last 72 hours. Thyroid Function Tests: No results  for input(s): "TSH", "T4TOTAL", "FREET4", "T3FREE", "THYROIDAB" in the last 72 hours. Anemia Panel: No results for input(s): "VITAMINB12", "FOLATE", "FERRITIN", "TIBC", "IRON", "RETICCTPCT" in the last 72 hours. Sepsis Labs: No results for input(s): "PROCALCITON", "LATICACIDVEN" in the last 168 hours.   Recent Results (from the past 240 hour(s))  Blood Culture (routine x 2)     Status: None   Collection Time: 04/25/22  2:44 PM   Specimen: Site Not Specified; Blood  Result Value Ref Range Status   Specimen Description   Final    SITE NOT SPECIFIED Performed at [facility] [address] [place], [place] [address]    Special Requests   Final    BOTTLES DRAWN AEROBIC AND ANAEROBIC Blood Culture adequate volume Performed at [facility] [address] [place], [place] [address]    Culture   Final    NO GROWTH 5 DAYS Performed at [facility], [place] [address], [place], [place] [address]    Report Status 04/30/2022 FINAL  Final  Blood Culture (routine x 2)     Status: None   Collection Time: 04/25/22  2:56 PM   Specimen: BLOOD LEFT HAND  Result Value Ref Range Status   Specimen Description   Final    BLOOD LEFT HAND Performed at [facility] [address], [place], [place] [address]    Special Requests   Final    BOTTLES DRAWN AEROBIC ONLY Blood Culture results may not be optimal due to an inadequate volume of blood received in culture bottles Performed at [facility] [address], [place], [place] [address]    Culture   Final    NO GROWTH 5 DAYS Performed at [facility], [place] [address],  [place], [place] [address]    Report Status 04/30/2022 FINAL  Final  SARS Coronavirus 2 by RT PCR (hospital order, performed in [facility] hospital lab) *cepheid single result test* Anterior Nasal Swab     Status: None   Collection Time: 04/25/22  5:04 PM   Specimen: Anterior Nasal Swab  Result  Value Ref Range Status   SARS Coronavirus 2 by RT PCR NEGATIVE NEGATIVE Final    Comment: (NOTE) SARS-CoV-2 target nucleic acids are NOT DETECTED.  The SARS-CoV-2 RNA is generally detectable in upper and lower respiratory specimens during the acute phase of infection. The lowest concentration of SARS-CoV-2 viral copies this assay can detect is 250 copies / mL. A negative result does not preclude SARS-CoV-2 infection and should not be used as the sole basis for treatment or other patient management decisions.  A negative result may occur with improper specimen collection / handling, submission of specimen other than nasopharyngeal swab, presence of viral mutation(s) within the areas targeted by this assay, and inadequate number of viral copies (<250 copies / mL). A negative result must be combined with clinical observations, patient history, and epidemiological information.  Fact Sheet for Patients:   RoadLapTop.co.za  Fact Sheet for Healthcare Providers: http://kim-miller.com/  This test is not yet approved or  cleared by the [place] FDA and has been authorized for detection and/or diagnosis of SARS-CoV-2 by FDA under an Emergency Use Authorization (EUA).  This EUA will remain in effect (meaning this test can be used) for the duration of the COVID-19 declaration under Section 564(b)(1) of the Act, 21 U.S.C. section 360bbb-3(b)(1), unless the authorization is terminated or revoked sooner.  Performed at [facility], [address] [place], [place] [address]   C Difficile Quick Screen w PCR reflex     Status: None   Collection Time: 04/26/22  6:00 PM   Specimen: STOOL  Result Value Ref Range Status   C Diff antigen NEGATIVE NEGATIVE Final   C Diff toxin NEGATIVE NEGATIVE Final   C Diff interpretation No C. difficile detected.  Final    Comment: Performed at [facility], [address], [place], [place] [address]  Gastrointestinal Panel by PCR , Stool     Status: Abnormal   Collection Time: 04/26/22  6:00 PM   Specimen: STOOL  Result Value Ref Range Status   Campylobacter species NOT DETECTED NOT DETECTED Final   Plesimonas shigelloides NOT DETECTED NOT DETECTED Final   Salmonella species NOT DETECTED NOT DETECTED Final   Yersinia enterocolitica NOT DETECTED NOT DETECTED Final   Vibrio species NOT DETECTED NOT DETECTED Final   Vibrio cholerae NOT DETECTED NOT DETECTED Final   Enteroaggregative E coli (EAEC) NOT DETECTED NOT DETECTED Final   Enteropathogenic E coli (EPEC) DETECTED (A) NOT DETECTED Final    Comment: RESULT CALLED TO, READ BACK BY AND VERIFIED WITH: DERRICK RICHTER 04/27/22 1028 KLW    Enterotoxigenic E coli (ETEC) NOT DETECTED NOT DETECTED Final   Shiga like toxin producing E coli (STEC) NOT DETECTED NOT DETECTED Final   Shigella/Enteroinvasive E coli (EIEC) NOT DETECTED NOT DETECTED Final   Cryptosporidium NOT DETECTED NOT DETECTED Final   Cyclospora cayetanensis NOT DETECTED NOT DETECTED Final   Entamoeba histolytica NOT DETECTED NOT DETECTED Final   Giardia lamblia NOT DETECTED NOT DETECTED Final   Adenovirus F40/41 NOT DETECTED NOT DETECTED Final   Astrovirus NOT DETECTED NOT DETECTED Final   Norovirus GI/GII NOT DETECTED NOT DETECTED Final  Rotavirus A NOT DETECTED NOT DETECTED Final   Sapovirus (I, II, IV, and V) NOT DETECTED NOT DETECTED Final    Comment: Performed at [facility], [address], [place], [place] [address]         Radiology Studies: No results found.      Scheduled Meds:  vitamin C  500 mg Oral BID   feeding supplement  237 mL Oral TID BM   ferrous sulfate  325 mg Oral BID WC   folic acid  1 mg Oral Daily   heparin injection (subcutaneous)  5,000 Units Subcutaneous Q8H   multivitamin with minerals  1 tablet Oral Daily   thiamine  100 mg Oral Daily   Continuous Infusions:   LOS: 9 days    Time  spent:40 min    Zavia Pullen, Geraldo Docker, MD [facility]   If 7PM-7AM, please contact night-coverage 05/04/2022, 4:13 PM

## 2022-05-04 NOTE — Plan of Care (Signed)
Discussed with patient plan of care for the evening, pain management and bed time medications with some teach back displayed.  Problem: Education: Goal: Knowledge of General Education information will improve Description: Including pain rating scale, medication(s)/side effects and non-pharmacologic comfort measures Outcome: Progressing

## 2022-05-04 NOTE — TOC Progression Note (Signed)
Transition of Care [facility]) - Progression Note    Patient Details  Name: Dylan Weaver MRN: [id number] Date of Birth: November 10, 1965  Transition of Care [facility]) CM/SW Contact  Golda Acre, RN Phone Number: 05/04/2022, 11:27 AM  Clinical Narrative:    Requested notes and chart information faxed to April at the [facility]. At 1115 for review for snf placement.   Expected Discharge Plan: Home/Self Care Barriers to Discharge: Continued Medical Work up  Expected Discharge Plan and Services Expected Discharge Plan: Home/Self Care   Discharge Planning Services: CM Consult   Living arrangements for the past 2 months: Single Family Home                                       Social Determinants of Health (SDOH) Interventions    Readmission Risk Interventions     No data to display

## 2022-05-05 DIAGNOSIS — E871 Hypo-osmolality and hyponatremia: Secondary | ICD-10-CM | POA: Diagnosis not present

## 2022-05-05 DIAGNOSIS — G9341 Metabolic encephalopathy: Secondary | ICD-10-CM | POA: Diagnosis not present

## 2022-05-05 DIAGNOSIS — F1010 Alcohol abuse, uncomplicated: Secondary | ICD-10-CM | POA: Diagnosis not present

## 2022-05-05 DIAGNOSIS — F1020 Alcohol dependence, uncomplicated: Secondary | ICD-10-CM | POA: Diagnosis not present

## 2022-05-05 LAB — COMPREHENSIVE METABOLIC PANEL
ALT: 32 U/L (ref 0–44)
AST: 49 U/L — ABNORMAL HIGH (ref 15–41)
Albumin: 2.4 g/dL — ABNORMAL LOW (ref 3.5–5.0)
Alkaline Phosphatase: 173 U/L — ABNORMAL HIGH (ref 38–126)
Anion gap: 8 (ref 5–15)
BUN: 8 mg/dL (ref 6–20)
CO2: 20 mmol/L — ABNORMAL LOW (ref 22–32)
Calcium: 8.5 mg/dL — ABNORMAL LOW (ref 8.9–10.3)
Chloride: 106 mmol/L (ref 98–111)
Creatinine, Ser: 0.33 mg/dL — ABNORMAL LOW (ref 0.61–1.24)
GFR, Estimated: >60 mL/min (ref >60)
Glucose, Bld: 92 mg/dL (ref 70–99)
Potassium: 4 mmol/L (ref 3.5–5.1)
Sodium: 134 mmol/L — ABNORMAL LOW (ref 135–145)
Total Bilirubin: 0.9 mg/dL (ref 0.3–1.2)
Total Protein: 7 g/dL (ref 6.5–8.1)

## 2022-05-05 LAB — CBC WITH DIFFERENTIAL/PLATELET
Abs Immature Granulocytes: 0.04 10*3/uL (ref 0.00–0.07)
Basophils Absolute: 0.1 10*3/uL (ref 0.0–0.1)
Basophils Relative: 1 %
Eosinophils Absolute: 0.2 10*3/uL (ref 0.0–0.5)
Eosinophils Relative: 3 %
HCT: 31.4 % — ABNORMAL LOW (ref 39.0–52.0)
Hemoglobin: 9.9 g/dL — ABNORMAL LOW (ref 13.0–17.0)
Immature Granulocytes: 1 %
Lymphocytes Relative: 23 %
Lymphs Abs: 1.7 10*3/uL (ref 0.7–4.0)
MCH: 33.1 pg (ref 26.0–34.0)
MCHC: 31.5 g/dL (ref 30.0–36.0)
MCV: 105 fL — ABNORMAL HIGH (ref 80.0–100.0)
Monocytes Absolute: 0.4 10*3/uL (ref 0.1–1.0)
Monocytes Relative: 6 %
Neutro Abs: 4.8 10*3/uL (ref 1.7–7.7)
Neutrophils Relative %: 66 %
Platelets: 319 10*3/uL (ref 150–400)
RBC: 2.99 MIL/uL — ABNORMAL LOW (ref 4.22–5.81)
RDW: 16.9 % — ABNORMAL HIGH (ref 11.5–15.5)
WBC: 7.3 10*3/uL (ref 4.0–10.5)
nRBC: 0 % (ref 0.0–0.2)

## 2022-05-05 LAB — MAGNESIUM: Magnesium: 1.6 mg/dL — ABNORMAL LOW (ref 1.7–2.4)

## 2022-05-05 LAB — AMMONIA: Ammonia: 38 umol/L — ABNORMAL HIGH (ref 9–35)

## 2022-05-05 LAB — PHOSPHORUS: Phosphorus: 3.2 mg/dL (ref 2.5–4.6)

## 2022-05-05 NOTE — Evaluation (Signed)
Occupational Therapy Evaluation Patient Details Name: Dylan Weaver MRN: [id number] DOB: 03-25-1966 Today's Date: 05/05/2022   History of Present Illness 55 y.o.m w/ history of alcohol abuse, previous history of alcoholic hepatitis brought into the ED by EMS after neighbor found the patient lying on the floor covered in feces and surrounded by empty beer cans.  Patient apparently reported to EMS that he fell 2 days prior to presentation. He was confused and history is very limited.   Clinical Impression   Treatment focused on working on functional mobility needed to advance ADLs. Patient supervision for bed mobility, min assist to stand and took several attempts to power up. Min guard for a couple feet but mostly min assist to steady with walker to ambulate to bathroom and then back to bed. Treatment limited due to patient's somewhat poor participation. He warmed up to therapist eventually. Still has some confusion because he mentioned he would be doing a lot of walking "at court tomorrow." Continue to recommend short term rehab at discharge. Discussed next treatment patient would stand at sink to brush his teeth.      Recommendations for follow up therapy are one component of a multi-disciplinary discharge planning process, led by the attending physician.  Recommendations may be updated based on patient status, additional functional criteria and insurance authorization.   Follow Up Recommendations  Skilled nursing-short term rehab (<3 hours/day)    Assistance Recommended at Discharge Frequent or constant Supervision/Assistance  Patient can return home with the following A little help with walking and/or transfers;A lot of help with bathing/dressing/bathroom;Assistance with cooking/housework    Functional Status Assessment  Patient has had a recent decline in their functional status and demonstrates the ability to make significant improvements in function in a reasonable and  predictable amount of time.  Equipment Recommendations  Other (comment) (defer)    Recommendations for Other Services       Precautions / Restrictions Precautions Precautions: Fall Precaution Comments: Impulsive Restrictions Weight Bearing Restrictions: No      Mobility Bed Mobility                    Transfers                          Balance Overall balance assessment: Needs assistance Sitting-balance support: No upper extremity supported Sitting balance-Leahy Scale: Fair     Standing balance support: Bilateral upper extremity supported, Reliant on assistive device for balance Standing balance-Leahy Scale: Poor                             ADL either performed or assessed with clinical judgement   ADL Overall ADL's : Needs assistance/impaired                                     Functional mobility during ADLs: Minimal assistance;Rolling walker (2 wheels) General ADL Comments: Patient supine in bed when therapist entered the room. He has nursing put him back to bed fairly soon after PT got him in the chair. Patient irritable with therapist intially and not overly receptive to therapy but acquiesed. Patient min assist to stand and min guard to take steps to the recliner with walker. Patient stating he only watned to get to chair. Thearpist bargained with patient - that if he walked to the bathroom  and back - would let him back in the bed. Patient min guard for a couple of steps and then min assist for steadying to ambualte to bathroom. He leaned against the wall in the bathroom before returning to edge of bed.     Vision Patient Visual Report: No change from baseline       Perception     Praxis      Pertinent Vitals/Pain Pain Assessment Pain Assessment: No/denies pain     Hand Dominance     Extremity/Trunk Assessment             Communication     Cognition Arousal/Alertness: Awake/alert Behavior During  Therapy: WFL for tasks assessed/performed Overall Cognitive Status: Within Functional Limits for tasks assessed                                 General Comments: Alert to self and grossly situation. Did tell me he was going to be walking alot tomorrow at court. Therapist cued patient that it was Friday and court doesn't typically happen on Saturday.     General Comments       Exercises     Shoulder Instructions      Home Living                                          Prior Functioning/Environment                          OT Problem List: Decreased activity tolerance;Decreased strength;Impaired balance (sitting and/or standing);Decreased safety awareness;Decreased cognition;Decreased knowledge of use of DME or AE      OT Treatment/Interventions: Self-care/ADL training;Therapeutic exercise;DME and/or AE instruction;Therapeutic activities;Balance training;Patient/family education    OT Goals(Current goals can be found in the care plan section) Acute Rehab OT Goals Patient Stated Goal: return home eventually OT Goal Formulation: With patient Time For Goal Achievement: 05/12/22 Potential to Achieve Goals: Fair  OT Frequency: Min 2X/week    Co-evaluation              AM-PAC OT "6 Clicks" Daily Activity     Outcome Measure Help from another person eating meals?: A Little Help from another person taking care of personal grooming?: A Little Help from another person toileting, which includes using toliet, bedpan, or urinal?: Total Help from another person bathing (including washing, rinsing, drying)?: A Lot Help from another person to put on and taking off regular upper body clothing?: A Little Help from another person to put on and taking off regular lower body clothing?: A Lot 6 Click Score: 14   End of Session Equipment Utilized During Treatment: Rolling walker (2 wheels);Gait belt Nurse Communication: Mobility status  Activity  Tolerance: Patient tolerated treatment well Patient left: in bed;with call bell/phone within reach  OT Visit Diagnosis: Unsteadiness on feet (R26.81);History of falling (Z91.81)                Time: 1440-1451 OT Time Calculation (min): 11 min Charges:  OT General Charges $OT Visit: 1 Visit OT Treatments $Self Care/Home Management : 8-22 mins  Selma Mink, OTR/L Acute Care Rehab Services  Office [phone] Pager: [phone]   Kelli Churn 05/05/2022, 3:05 PM

## 2022-05-05 NOTE — TOC Progression Note (Signed)
Transition of Care [facility]) - Progression Note    Patient Details  Name: Dylan Weaver MRN: [id number] Date of Birth: 1966-08-04  Transition [facility]) CM/SW Contact  Amada Jupiter, LCSW Phone Number: 05/05/2022, 4:39 PM  Clinical Narrative:     Received approval from the [place] for 30 day SNF rehab stay in [place] approved facility.  Bed search begun this afternoon.  Expected Discharge Plan: Home/Self Care Barriers to Discharge: Continued Medical Work up  Expected Discharge Plan and Services Expected Discharge Plan: Home/Self Care   Discharge Planning Services: CM Consult   Living arrangements for the past 2 months: Single Family Home                                       Social Determinants of Health (SDOH) Interventions    Readmission Risk Interventions     No data to display

## 2022-05-05 NOTE — Progress Notes (Signed)
Physical Therapy Treatment Patient Details Name: Dylan Weaver MRN: [id number] DOB: 09/05/1966 Today's Date: 05/05/2022   History of Present Illness 55 y.o.m w/ history of alcohol abuse, previous history of alcoholic hepatitis brought into the ED by EMS after neighbor found the patient lying on the floor covered in feces and surrounded by empty beer cans.  Patient apparently reported to EMS that he fell 2 days prior to presentation. He was confused and history is very limited.    PT Comments    General Comments: AxO x 3 impulsive, honary.  Familiar from prior admit.  Lives home alone.  Still coping with loss of his Mother "about a year now".  Drink beer in excess.  Required Max encouragement to participate. Pt has comes to terms he needs to "go to [employer]". Very unsteady gait with profound weakness.  Assisted OOB to amb to bathroom as pt declined to leave room.  General bed mobility comments: pt able to self sit EOB but was using excessive B UE's to support self. General transfer comment: severe posterior LOB with sit to stand and uncontrolled stand to sit.  Profoundly weak.  Unsteady.  Required + 2 assist for safety with turns and back steps.  Poor self balance correction and impaited coordination. General Gait Details: limited amb distance to and from bathroom  with poor balance, poor posture, impulsive, VERY "drunken" gait.  HIGH FALL RISK.  Near fall with stepping backward  during session. Max c/o weakness/fatigue.  Pt will need ST Rehab at SNF to address mobility and functional decline prior to safely returning home.   Recommendations for follow up therapy are one component of a multi-disciplinary discharge planning process, led by the attending physician.  Recommendations may be updated based on patient status, additional functional criteria and insurance authorization.  Follow Up Recommendations  Skilled nursing-short term rehab (<3 hours/day) Can patient physically be  transported by private vehicle: No   Assistance Recommended at Discharge Intermittent Supervision/Assistance  Patient can return home with the following A lot of help with walking and/or transfers;A lot of help with bathing/dressing/bathroom;Assist for transportation;Assistance with cooking/housework   Equipment Recommendations  None recommended by PT    Recommendations for Other Services       Precautions / Restrictions Precautions Precautions: Fall Precaution Comments: Impulsive Restrictions Weight Bearing Restrictions: No     Mobility  Bed Mobility Overal bed mobility: Needs Assistance Bed Mobility: Supine to Sit     Supine to sit: Min guard, Supervision     General bed mobility comments: pt able to self sit EOB but was using excessive B UE's to support self.    Transfers Overall transfer level: Needs assistance Equipment used: Rolling walker (2 wheels) Transfers: Sit to/from Stand Sit to Stand: Mod assist, Max assist, +2 physical assistance, +2 safety/equipment Stand pivot transfers: Max assist         General transfer comment: severe posterior LOB with sit to stand and uncontrolled stand to sit.  Profoundly weak.  Unsteady.  Required + 2 assist for safety with turns and back steps.  Poor self balance correction and impaited coordination.    Ambulation/Gait Ambulation/Gait assistance: Min assist, Mod assist, +2 physical assistance, +2 safety/equipment Gait Distance (Feet): 25 Feet Assistive device: Rolling walker (2 wheels) Gait Pattern/deviations: Step-to pattern, Wide base of support Gait velocity: decreased     General Gait Details: limited amb distance to and from bathroom  with poor balance, poor posture, impulsive, VERY "drunken" gait.  HIGH FALL RISK.  Near fall with stepping backward  during session. Max c/o weakness/fatigue.   Stairs             Wheelchair Mobility    Modified Rankin (Stroke Patients Only)       Balance                                             Cognition Arousal/Alertness: Awake/alert Behavior During Therapy: WFL for tasks assessed/performed Overall Cognitive Status: Within Functional Limits for tasks assessed                                 General Comments: AxO x 3 impulsive, honary.  Familiar from prior admit.  Lives home alone.  Still coping with loss of his Mother "about a year now".  Drink beer in excess.  Required Max encouragement to participate.        Exercises      General Comments        Pertinent Vitals/Pain Pain Assessment Pain Assessment: Faces Faces Pain Scale: Hurts a little bit Pain Location: back Pain Descriptors / Indicators: Aching    Home Living                          Prior Function            PT Goals (current goals can now be found in the care plan section) Progress towards PT goals: Progressing toward goals    Frequency    Min 3X/week      PT Plan Current plan remains appropriate    Co-evaluation              AM-PAC PT "6 Clicks" Mobility   Outcome Measure  Help needed turning from your back to your side while in a flat bed without using bedrails?: A Lot Help needed moving from lying on your back to sitting on the side of a flat bed without using bedrails?: A Lot Help needed moving to and from a bed to a chair (including a wheelchair)?: A Lot Help needed standing up from a chair using your arms (e.g., wheelchair or bedside chair)?: A Lot Help needed to walk in hospital room?: A Lot Help needed climbing 3-5 steps with a railing? : Total 6 Click Score: 11    End of Session Equipment Utilized During Treatment: Gait belt Activity Tolerance: Patient tolerated treatment well Patient left: in chair;with call bell/phone within reach;with chair alarm set Nurse Communication: Mobility status PT Visit Diagnosis: Difficulty in walking, not elsewhere classified (R26.2);Muscle weakness (generalized)  (M62.81)     Time: 6834-1962 PT Time Calculation (min) (ACUTE ONLY): 17 min  Charges:  $Gait Training: 8-22 mins                   Felecia Shelling  PTA Acute  Rehabilitation Services Office M-F          [phone] Weekend pager [phone]

## 2022-05-05 NOTE — NC FL2 (Signed)
MEDICAID FL2 LEVEL OF CARE SCREENING TOOL     IDENTIFICATION  Patient Name: Dylan Weaver Birthdate: December 07, 1965 Sex: male Admission Date (Current Location): 04/25/2022  [facility] and [place] Number:  Producer, television/film/video and Address:  [facility],  [address] [place], [place] [address]      Provider Number: [id number]  Attending Physician Name and Address:  Drema Dallas, MD  Relative Name and Phone Number:  aunt, Frederica Kuster    Current Level of Care: Hospital Recommended Level of Care:   Prior Approval Number:    Date Approved/Denied:   PASRR Number: [id number]  Discharge Plan: SNF    Current Diagnoses: Patient Active Problem List   Diagnosis Date Noted   Hypophosphatemia 04/29/2022   Enteropathogenic Escherichia coli infection 04/27/2022   Hypokalemia 04/25/2022   Aspiration pneumonia (HCC) 04/25/2022   Debility 11/03/2021   Acute metabolic encephalopathy 11/02/2021   Enterococcal bacteremia    Pressure ulcer 10/24/2021   Irritant contact dermatitis due to incontinence of both feces and urine 10/22/2021   Rhabdomyolysis 10/22/2021   Sepsis with acute organ dysfunction without septic shock due to bilateral thigh/pubic area cellulitis and enterococcal bacteremia 10/22/2021   Hypomagnesemia 08/31/2021   Hepatic steatosis 08/31/2021   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)    Tobacco abuse 08/26/2021   Infection of right foot with necrosis 08/26/2021   Foot infection 08/26/2021   Hyponatremia    Decompensated hepatic cirrhosis (HCC) 08/25/2021   Protein-calorie malnutrition, severe 06/14/2021   Elevated LFTs 11/29/2020   Intertrigo 11/29/2020   Prolonged QT interval 11/29/2020   Malnutrition of moderate degree 09/16/2020   Alcohol withdrawal (HCC) 09/14/2020   Alcoholic hepatitis 09/14/2020   Thrombocytopenia (HCC) 09/14/2020   Normochromic normocytic anemia 09/14/2020    Alcohol use disorder with alcohol withdrawal  06/19/2020   Alcohol abuse with intoxication (HCC) 06/19/2020    Orientation RESPIRATION BLADDER Height & Weight     Self, Time, Situation, Place  Normal Incontinent Weight: 167 lb 11.2 oz (76.1 kg) Height:  6' (182.9 cm)  BEHAVIORAL SYMPTOMS/MOOD NEUROLOGICAL BOWEL NUTRITION STATUS      Incontinent    AMBULATORY STATUS COMMUNICATION OF NEEDS Skin   Limited Assist Verbally PU Stage and Appropriate Care   PU Stage 2 Dressing: Daily                   Personal Care Assistance Level of Assistance  Bathing, Dressing Bathing Assistance: Limited assistance   Dressing Assistance: Limited assistance     Functional Limitations Info             SPECIAL CARE FACTORS FREQUENCY  PT (By licensed PT), OT (By licensed OT)     PT Frequency: 5x/wk OT Frequency: 5x/wk            Contractures Contractures Info: Not present    Additional Factors Info  Code Status, Allergies Code Status Info: Full Allergies Info: NKDA           Current Medications (05/05/2022):  This is the current hospital active medication list Current Facility-Administered Medications  Medication Dose Route Frequency Provider Last Rate Last Admin   acetaminophen (TYLENOL) tablet 650 mg  650 mg Oral Q6H PRN Osvaldo Shipper, MD       Or   acetaminophen (TYLENOL) suppository 650 mg  650 mg Rectal Q6H PRN Osvaldo Shipper, MD       ascorbic acid (VITAMIN C) tablet 500 mg  500 mg Oral BID Kc, Ramesh, MD   500 mg at 05/05/22 1054   feeding supplement (ENSURE ENLIVE / ENSURE PLUS) liquid 237 mL  237 mL Oral TID BM Kc, Ramesh, MD   237 mL at 05/04/22 1402   ferrous sulfate tablet 325 mg  325 mg Oral BID WC Kc, Ramesh, MD   325 mg at 05/05/22 0759   folic acid (FOLVITE) tablet 1 mg  1 mg Oral Daily Kc, Ramesh, MD   1 mg at 05/05/22 1054   heparin injection 5,000 Units  5,000 Units Subcutaneous Q8H Herby Abraham, RPH   5,000 Units at 05/05/22 1347   liver oil-zinc  oxide (DESITIN) 40 % ointment   Topical TID PRN Lanae Boast, MD       multivitamin with minerals tablet 1 tablet  1 tablet Oral Daily Osvaldo Shipper, MD   1 tablet at 05/05/22 1054   Oral care mouth rinse  15 mL Mouth Rinse PRN Kc, Dayna Barker, MD       prochlorperazine (COMPAZINE) injection 10 mg  10 mg Intravenous Q4H PRN Osvaldo Shipper, MD       thiamine (VITAMIN B1) tablet 100 mg  100 mg Oral Daily Kc, Ramesh, MD   100 mg at 05/05/22 1054     Discharge Medications: Please see discharge summary for a list of discharge medications.  Relevant Imaging Results:  Relevant Lab Results:   Additional Information SSN: [id number].  Makaveli Hoard, LCSW

## 2022-05-05 NOTE — Progress Notes (Signed)
PROGRESS NOTE    Dylan Weaver  SWF:[id number] DOB: [birth year] DOA: 04/25/2022 PCP: [name]     Brief Narrative:  56 y.o.WM PMHx EtOH abuse, Hx EtOH Hepatitis, prolonged QT interval, pressure ulcer,  Brought into the ED by EMS after neighbor found the patient lying on the floor covered in feces and surrounded by empty beer cans.  Patient apparently reported to EMS that he fell 2 days prior to presentation. He was confused and history is very limited.  He had diarrhea and GI panel came back positive for enteropathogenic E. coli and resolved without treatment he had hypovolemic hyponatremia dehydration that resolved with IV fluids.  His encephalopathy has significantly improved.  He remains weak and deconditioned PT OT recommend skilled nursing facility placement      Subjective: 8/4 afebrile overnight, A/O x4.  Negative abdominal pain, negative SOB.  Patient attempted to ambulate in hallway stated was very difficult, changed his mind and now believes SNF is the better plan for his care going forward.   Assessment & Plan: Covid vaccination;   Principal Problem:   Hyponatremia Active Problems:   Alcohol use disorder with alcohol withdrawal    Thrombocytopenia (HCC)   Normochromic normocytic anemia   Elevated LFTs   Prolonged QT interval   Pressure ulcer   Acute metabolic encephalopathy   Hypokalemia   Aspiration pneumonia (HCC)   Enteropathogenic Escherichia coli infection   Hypophosphatemia  Enteropathogenic E. coli diarrhea: diarrhea resolved.   -enteric precaution  -continue diet   Hypovolemic hyponatremia Lab Results  Component Value Date   NA 134 (L) 05/05/2022   NA 133 (L) 05/04/2022   NA 131 (L) 05/03/2022   NA 133 (L) 05/03/2022   NA 131 (L) 05/01/2022  -Resolving   Dehydration/ketosis in urine: -resume IVF for today as BP low as well -8/3 resolved     Aspiration pneumonia with CT showing-left-sided groundglass opacities  -cxr  neg. -Likely aspirated. -changed unasyn to augmentin -Completed course antibiotics.   Transaminitis/abnormal lfts:  -likely etoh related.likely secondary to his alcohol abuse. CT scan showed cholelithiasis without evidence of cholecystitis.Hepatic steatosis noted.  Acute hepatitis panel negative.  AST/ALT 69/50 T. bili2.7 > downtrending.     Acute metabolic encephalopathy:   likely due to metabolic derangements/alcoholism/ aspiration.non focal on exam. CT head- non acute-ammonia level normal.  Due to his alcohol abuse history started on high-dose thiamine,ciwa ativan, etoh cessation counseling.  Mental status has improved.  PTOT eval-advised skilled nursing facility -8/3 resolved   Alcohol abuse with alcohol withdrawal:  -Last alcohol use unknown patient reports 2 days PTA, drinks at least 12 beers on daily basis, possibly liquid.  -Continue high-dose thiamine, folic acid, CIWA scale Ativan prn. TOC consult for cessation.   Lactic acidosis - 7/26 resolved     Old healed left rib fractures/old clavicle fracture/chronic L1 compression fracture : Respiratory status is stable, no chest pain.  Continue incentive spirometry, pain control.     Normocytic anemia   Folic acid deficiency   Thrombocytopenia: Suspect multifactorial hemodilution, anemia of chronic disease.  No active bleeding noted Hemoccult was negative.B12 is normal, iron normal 53 ferritin high 913.Last colonoscopy couple of years ago he says and was " okay" per him. Patient will need extensive follow-up with GI PCP and age-appropriate cancer screening as outpatient.  - po iron   Cholelithiasis:  -Noted incidentally on CT scan -No cholecystitis. Monitor.   QT prolongation:  -Likely from electrolyte imbalance repleting electrolytes aggressively.monitor in tele.  Deconditioning/debility  -Obtain PT OT eval, encourage ambulation.  SNF pending   Pressure ulcer POA on the back stage II on the left shoulder  DTI  Pressure Injury 04/26/22 Back Mid Stage 2 -  Partial thickness loss of dermis presenting as a shallow open injury with a red, pink wound bed without slough. (Active)  04/26/22 1517  Location: Back  Location Orientation: Mid  Staging: Stage 2 -  Partial thickness loss of dermis presenting as a shallow open injury with a red, pink wound bed without slough.  Wound Description (Comments):   Present on Admission: Yes  Dressing Type Foam - Lift dressing to assess site every shift 05/05/22 1050    Hypomagnesmia - Magnesium goal> 2 - 8/2 Magnesium IV 3 g  Hypocalcemia  Hypokalemia - Potassium goal> 4 - 8/3 K-Dur 50 mEq   Goals of care - 8/2 consult [facility] SNF placement    Mobility Assessment (last 72 hours)     Mobility Assessment     Row Name 05/05/22 1504 05/05/22 1339 05/05/22 0758 05/04/22 2100 05/04/22 2000   Does patient have an order for bedrest or is patient medically unstable -- -- No - Continue assessment No - Continue assessment No - Continue assessment   What is the highest level of mobility based on the progressive mobility assessment? Level 5 (Walks with assist in room/hall) - Balance while stepping forward/back and can walk in room with assist - Complete Level 4 (Walks with assist in room) - Balance while marching in place and cannot step forward and back - Complete Level 3 (Stands with assist) - Balance while standing  and cannot march in place Level 4 (Walks with assist in room) - Balance while marching in place and cannot step forward and back - Complete Level 4 (Walks with assist in room) - Balance while marching in place and cannot step forward and back - Complete   Is the above level different from baseline mobility prior to current illness? -- -- Yes - Recommend PT order -- Yes - Recommend PT order    Row Name 05/04/22 0800 05/03/22 2046 05/03/22 2035 05/03/22 1247 05/03/22 0753   Does patient have an order for bedrest or is patient medically unstable No - Continue  assessment No - Continue assessment No - Continue assessment -- No - Continue assessment   What is the highest level of mobility based on the progressive mobility assessment? Level 4 (Walks with assist in room) - Balance while marching in place and cannot step forward and back - Complete Level 4 (Walks with assist in room) - Balance while marching in place and cannot step forward and back - Complete Level 4 (Walks with assist in room) - Balance while marching in place and cannot step forward and back - Complete Level 4 (Walks with assist in room) - Balance while marching in place and cannot step forward and back - Complete Level 4 (Walks with assist in room) - Balance while marching in place and cannot step forward and back - Complete   Is the above level different from baseline mobility prior to current illness? Yes - Recommend PT order -- -- -- Yes - Recommend PT order    Row Name 05/02/22 2050           Does patient have an order for bedrest or is patient medically unstable No - Continue assessment       What is the highest level of mobility based on the progressive mobility assessment? Level  4 (Walks with assist in room) - Balance while marching in place and cannot step forward and back - Complete                        DVT prophylaxis: Subcu heparin Code Status: Full Family Communication:  Status is: Inpatient    Dispo: The patient is from: Home              Anticipated d/c is to: SNF              Anticipated d/c date is: 3 days              Patient currently is not medically stable to d/c.      Consultants:    Procedures/Significant Events:    I have personally reviewed and interpreted all radiology studies and my findings are as above.  VENTILATOR SETTINGS:    Cultures   Antimicrobials: Anti-infectives (From admission, onward)    Start     Dose/Rate Route Frequency Ordered Stop   04/28/22 1400  amoxicillin-clavulanate (AUGMENTIN) 875-125 MG per tablet  1 tablet        1 tablet Oral Every 12 hours 04/28/22 1139 04/30/22 2139   04/25/22 1800  Ampicillin-Sulbactam (UNASYN) 3 g in sodium chloride 0.9 % 100 mL IVPB  Status:  Discontinued        3 g 200 mL/hr over 30 Minutes Intravenous Every 6 hours 04/25/22 1748 04/28/22 1139         Devices    LINES / TUBES:      Continuous Infusions:   Objective: Vitals:   05/04/22 0051 05/04/22 1518 05/04/22 2014 05/05/22 0420  BP: 112/68 (!) 91/54 96/70 (!) 93/58  Pulse: 84 79 71 79  Resp:  20 17 20   Temp:  98.2 F (36.8 C) 97.6 F (36.4 C) 98 F (36.7 C)  TempSrc:  Oral Oral Oral  SpO2:  100% 100% 100%  Weight:      Height:        Intake/Output Summary (Last 24 hours) at 05/05/2022 1536 Last data filed at 05/05/2022 1000 Gross per 24 hour  Intake 540 ml  Output 1150 ml  Net -610 ml    Filed Weights   04/25/22 1403 05/03/22 1200  Weight: 83.9 kg 76.1 kg    Examination:  General: A/O x4, No acute respiratory distress Eyes: negative scleral hemorrhage, negative anisocoria, negative icterus ENT: Negative Runny nose, negative gingival bleeding, Neck:  Negative scars, masses, torticollis, lymphadenopathy, JVD Lungs: Clear to auscultation bilaterally without wheezes or crackles Cardiovascular: Regular rate and rhythm without murmur gallop or rub normal S1 and S2 Abdomen: negative abdominal pain, nondistended, positive soft, bowel sounds, no rebound, no ascites, no appreciable mass Extremities: No significant cyanosis, clubbing, or edema bilateral lower extremities Skin: Negative rashes, lesions, ulcers Psychiatric:  Negative depression, negative anxiety, negative fatigue, negative mania  Central nervous system:  Cranial nerves II through XII intact, tongue/uvula midline, all extremities muscle strength 5/5, sensation intact throughout, negative dysarthria, negative expressive aphasia, negative receptive aphasia.  .     Data Reviewed: Care during the described time interval  was provided by me .  I have reviewed this patient's available data, including medical history, events of note, physical examination, and all test results as part of my evaluation.  CBC: Recent Labs  Lab 04/30/22 0346 05/01/22 0355 05/03/22 1257 05/04/22 0337 05/05/22 0517  WBC 6.9 8.2 7.7 7.8 7.3  NEUTROABS  --   --  5.2 5.4 4.8  HGB 8.0* 7.6* 9.0* 8.5* 9.9*  HCT 24.1* 23.1* 27.0* 26.0* 31.4*  MCV 101.7* 101.3* 101.9* 103.6* 105.0*  PLT 129* 149* 306 314 [id number]    Basic Metabolic Panel: Recent Labs  Lab 04/29/22 0422 04/30/22 0346 05/01/22 0355 05/03/22 0333 05/03/22 1257 05/04/22 0337 05/05/22 0517  NA 133* 133* 131* 133* 131* 133* 134*  K 3.6 3.3* 3.7 3.6 3.7 3.4* 4.0  CL 104 102 104 105 102 106 106  CO2 22 23 20* 19* 22 21* 20*  GLUCOSE 97 94 99 104* 101* 110* 92  BUN 9 9 8 8 9 8 8   CREATININE 0.55* 0.59* 0.44* 0.51* 0.46* 0.44* 0.33*  CALCIUM 8.1* 8.2* 8.1* 7.9* 8.0* 7.6* 8.5*  MG 1.6* 1.8  --   --  1.2* 1.9 1.6*  PHOS  --   --   --   --  3.4 2.8 3.2    GFR: Estimated Creatinine Clearance: 112.3 mL/min (A) (by C-G formula based on SCr of 0.33 mg/dL (L)). Liver Function Tests: Recent Labs  Lab 05/03/22 1257 05/04/22 0337 05/05/22 0517  AST 56* 42* 49*  ALT 32 32 32  ALKPHOS 157* 145* 173*  BILITOT 1.1 0.8 0.9  PROT 6.4* 6.3* 7.0  ALBUMIN 2.1* 2.1* 2.4*    No results for input(s): "LIPASE", "AMYLASE" in the last 168 hours. Recent Labs  Lab 05/03/22 1257 05/04/22 0337 05/05/22 0517  AMMONIA 28 25 38*    Coagulation Profile: No results for input(s): "INR", "PROTIME" in the last 168 hours. Cardiac Enzymes: No results for input(s): "CKTOTAL", "CKMB", "CKMBINDEX", "TROPONINI" in the last 168 hours. BNP (last 3 results) No results for input(s): "PROBNP" in the last 8760 hours. HbA1C: No results for input(s): "HGBA1C" in the last 72 hours. CBG: No results for input(s): "GLUCAP" in the last 168 hours. Lipid Profile: No results for input(s): "CHOL",  "HDL", "LDLCALC", "TRIG", "CHOLHDL", "LDLDIRECT" in the last 72 hours. Thyroid Function Tests: No results for input(s): "TSH", "T4TOTAL", "FREET4", "T3FREE", "THYROIDAB" in the last 72 hours. Anemia Panel: No results for input(s): "VITAMINB12", "FOLATE", "FERRITIN", "TIBC", "IRON", "RETICCTPCT" in the last 72 hours. Sepsis Labs: No results for input(s): "PROCALCITON", "LATICACIDVEN" in the last 168 hours.   Recent Results (from the past 240 hour(s))  SARS Coronavirus 2 by RT PCR (hospital order, performed in [facility] hospital lab) *cepheid single result test* Anterior Nasal Swab     Status: None   Collection Time: 04/25/22  5:04 PM   Specimen: Anterior Nasal Swab  Result Value Ref Range Status   SARS Coronavirus 2 by RT PCR NEGATIVE NEGATIVE Final    Comment: (NOTE) SARS-CoV-2 target nucleic acids are NOT DETECTED.  The SARS-CoV-2 RNA is generally detectable in upper and lower respiratory specimens during the acute phase of infection. The lowest concentration of SARS-CoV-2 viral copies this assay can detect is 250 copies / mL. A negative result does not preclude SARS-CoV-2 infection and should not be used as the sole basis for treatment or other patient management decisions.  A negative result may occur with improper specimen collection / handling, submission of specimen other than nasopharyngeal swab, presence of viral mutation(s) within the areas targeted by this assay, and inadequate number of viral copies (<250 copies / mL). A negative result must be combined with clinical observations, patient history, and epidemiological information.  Fact Sheet for Patients:   https://www.patel.info/  Fact Sheet for Healthcare Providers: https://hall.com/  This test is not yet approved or  cleared by  the [employer] and has been authorized for detection and/or diagnosis of SARS-CoV-2 by FDA under an Emergency Use Authorization (EUA).   This EUA will remain in effect (meaning this test can be used) for the duration of the COVID-19 declaration under Section 564(b)(1) of the Act, 21 U.S.C. section 360bbb-3(b)(1), unless the authorization is terminated or revoked sooner.  Performed at [facility], [place] [address] [place], [place] [address]   C Difficile Quick Screen w PCR reflex     Status: None   Collection Time: 04/26/22  6:00 PM   Specimen: STOOL  Result Value Ref Range Status   C Diff antigen NEGATIVE NEGATIVE Final   C Diff toxin NEGATIVE NEGATIVE Final   C Diff interpretation No C. difficile detected.  Final    Comment: Performed at [facility], [place] [address], [place], [place] [address]  Gastrointestinal Panel by PCR , Stool     Status: Abnormal   Collection Time: 04/26/22  6:00 PM   Specimen: STOOL  Result Value Ref Range Status   Campylobacter species NOT DETECTED NOT DETECTED Final   Plesimonas shigelloides NOT DETECTED NOT DETECTED Final   Salmonella species NOT DETECTED NOT DETECTED Final   Yersinia enterocolitica NOT DETECTED NOT DETECTED Final   Vibrio species NOT DETECTED NOT DETECTED Final   Vibrio cholerae NOT DETECTED NOT DETECTED Final   Enteroaggregative E coli (EAEC) NOT DETECTED NOT DETECTED Final   Enteropathogenic E coli (EPEC) DETECTED (A) NOT DETECTED Final    Comment: RESULT CALLED TO, READ BACK BY AND VERIFIED WITH: DERRICK RICHTER 04/27/22 1028 KLW    Enterotoxigenic E coli (ETEC) NOT DETECTED NOT DETECTED Final   Shiga like toxin producing E coli (STEC) NOT DETECTED NOT DETECTED Final   Shigella/Enteroinvasive E coli (EIEC) NOT DETECTED NOT DETECTED Final   Cryptosporidium NOT DETECTED NOT DETECTED Final   Cyclospora cayetanensis NOT DETECTED NOT DETECTED Final   Entamoeba histolytica NOT DETECTED NOT DETECTED Final   Giardia lamblia NOT DETECTED NOT DETECTED Final   Adenovirus F40/41 NOT DETECTED NOT DETECTED Final   Astrovirus NOT DETECTED  NOT DETECTED Final   Norovirus GI/GII NOT DETECTED NOT DETECTED Final   Rotavirus A NOT DETECTED NOT DETECTED Final   Sapovirus (I, II, IV, and V) NOT DETECTED NOT DETECTED Final    Comment: Performed at [facility], [address], [place], [place] [address]         Radiology Studies: No results found.      Scheduled Meds:  vitamin C  500 mg Oral BID   feeding supplement  237 mL Oral TID BM   ferrous sulfate  325 mg Oral BID WC   folic acid  1 mg Oral Daily   heparin injection (subcutaneous)  5,000 Units Subcutaneous Q8H   multivitamin with minerals  1 tablet Oral Daily   thiamine  100 mg Oral Daily   Continuous Infusions:   LOS: 10 days    Time spent:40 min    Bassy Fetterly, Geraldo Docker, MD [facility]   If 7PM-7AM, please contact night-coverage 05/05/2022, 3:36 PM

## 2022-05-06 DIAGNOSIS — F1010 Alcohol abuse, uncomplicated: Secondary | ICD-10-CM | POA: Diagnosis not present

## 2022-05-06 DIAGNOSIS — F1020 Alcohol dependence, uncomplicated: Secondary | ICD-10-CM | POA: Diagnosis not present

## 2022-05-06 DIAGNOSIS — G9341 Metabolic encephalopathy: Secondary | ICD-10-CM | POA: Diagnosis not present

## 2022-05-06 DIAGNOSIS — E871 Hypo-osmolality and hyponatremia: Secondary | ICD-10-CM | POA: Diagnosis not present

## 2022-05-06 LAB — COMPREHENSIVE METABOLIC PANEL
ALT: 30 U/L (ref 0–44)
AST: 47 U/L — ABNORMAL HIGH (ref 15–41)
Albumin: 2.2 g/dL — ABNORMAL LOW (ref 3.5–5.0)
Alkaline Phosphatase: 170 U/L — ABNORMAL HIGH (ref 38–126)
Anion gap: 7 (ref 5–15)
BUN: 8 mg/dL (ref 6–20)
CO2: 21 mmol/L — ABNORMAL LOW (ref 22–32)
Calcium: 8.4 mg/dL — ABNORMAL LOW (ref 8.9–10.3)
Chloride: 103 mmol/L (ref 98–111)
Creatinine, Ser: 0.44 mg/dL — ABNORMAL LOW (ref 0.61–1.24)
GFR, Estimated: >60 mL/min (ref >60)
Glucose, Bld: 87 mg/dL (ref 70–99)
Potassium: 3.9 mmol/L (ref 3.5–5.1)
Sodium: 131 mmol/L — ABNORMAL LOW (ref 135–145)
Total Bilirubin: 0.8 mg/dL (ref 0.3–1.2)
Total Protein: 6.6 g/dL (ref 6.5–8.1)

## 2022-05-06 LAB — CBC WITH DIFFERENTIAL/PLATELET
Abs Immature Granulocytes: 0.03 10*3/uL (ref 0.00–0.07)
Basophils Absolute: 0.1 10*3/uL (ref 0.0–0.1)
Basophils Relative: 1 %
Eosinophils Absolute: 0.3 10*3/uL (ref 0.0–0.5)
Eosinophils Relative: 3 %
HCT: 26.4 % — ABNORMAL LOW (ref 39.0–52.0)
Hemoglobin: 8.8 g/dL — ABNORMAL LOW (ref 13.0–17.0)
Immature Granulocytes: 0 %
Lymphocytes Relative: 23 %
Lymphs Abs: 1.8 10*3/uL (ref 0.7–4.0)
MCH: 33.5 pg (ref 26.0–34.0)
MCHC: 33.3 g/dL (ref 30.0–36.0)
MCV: 100.4 fL — ABNORMAL HIGH (ref 80.0–100.0)
Monocytes Absolute: 0.5 10*3/uL (ref 0.1–1.0)
Monocytes Relative: 7 %
Neutro Abs: 4.9 10*3/uL (ref 1.7–7.7)
Neutrophils Relative %: 66 %
Platelets: 400 10*3/uL (ref 150–400)
RBC: 2.63 MIL/uL — ABNORMAL LOW (ref 4.22–5.81)
RDW: 16.2 % — ABNORMAL HIGH (ref 11.5–15.5)
WBC: 7.6 10*3/uL (ref 4.0–10.5)
nRBC: 0 % (ref 0.0–0.2)

## 2022-05-06 LAB — MAGNESIUM: Magnesium: 1.5 mg/dL — ABNORMAL LOW (ref 1.7–2.4)

## 2022-05-06 LAB — AMMONIA: Ammonia: 33 umol/L (ref 9–35)

## 2022-05-06 LAB — PHOSPHORUS: Phosphorus: 4.3 mg/dL (ref 2.5–4.6)

## 2022-05-06 MED ORDER — MAGNESIUM SULFATE 4 GM/100ML IV SOLN
4.0000 g | Freq: Once | INTRAVENOUS | Status: AC
  Administered 2022-05-06: 4 g via INTRAVENOUS
  Filled 2022-05-06: qty 100

## 2022-05-06 MED ORDER — SODIUM CHLORIDE 0.9 % IV SOLN: INTRAVENOUS | Status: DC

## 2022-05-06 NOTE — TOC Progression Note (Signed)
Transition of Care [facility]) - Progression Note    Patient Details  Name: Dylan Weaver MRN: [id number] Date of Birth: 1966/02/16  [facility]) CM/SW Contact  [place], [place], [place] Phone Number: 05/06/2022, 11:48 AM  Clinical Narrative:     Faxed out to [facility] in [place] who are also under contract with [facility].    Expected Discharge Plan: Home/Self Care Barriers to Discharge: Continued Medical Work up  Expected Discharge Plan and Services Expected Discharge Plan: Home/Self Care   Discharge Planning Services: CM Consult   Living arrangements for the past 2 months: Single Family Home                                       Social Determinants of Health (SDOH) Interventions    Readmission Risk Interventions     No data to display

## 2022-05-06 NOTE — Plan of Care (Signed)
  Problem: Coping: Goal: Level of anxiety will decrease Outcome: Progressing   Problem: Pain Managment: Goal: General experience of comfort will improve Outcome: Progressing   Problem: Safety: Goal: Ability to remain free from injury will improve Outcome: Progressing   

## 2022-05-06 NOTE — Progress Notes (Signed)
PROGRESS NOTE    Dylan Weaver  CVE:[id number] DOB: 02-Oct-1966 DOA: 04/25/2022 PCP: [name]     Brief Narrative:  56 y.o.WM PMHx EtOH abuse, Hx EtOH Hepatitis, prolonged QT interval, pressure ulcer,  Brought into the ED by EMS after neighbor found the patient lying on the floor covered in feces and surrounded by empty beer cans.  Patient apparently reported to EMS that he fell 2 days prior to presentation. He was confused and history is very limited.  He had diarrhea and GI panel came back positive for enteropathogenic E. coli and resolved without treatment he had hypovolemic hyponatremia dehydration that resolved with IV fluids.  His encephalopathy has significantly improved.  He remains weak and deconditioned PT OT recommend skilled nursing facility placement      Subjective: 8/5 afebrile overnight, A/O x4.  Negative abdominal pain.  Negative SOB.   Assessment & Plan: Covid vaccination;   Principal Problem:   Hyponatremia Active Problems:   Alcohol use disorder with alcohol withdrawal    Thrombocytopenia (HCC)   Normochromic normocytic anemia   Elevated LFTs   Prolonged QT interval   Pressure ulcer   Acute metabolic encephalopathy   Hypokalemia   Aspiration pneumonia (HCC)   Enteropathogenic Escherichia coli infection   Hypophosphatemia  Enteropathogenic E. coli diarrhea: diarrhea resolved.   -enteric precaution  -continue diet   Hypovolemic hyponatremia Lab Results  Component Value Date   NA 131 (L) 05/06/2022   NA 134 (L) 05/05/2022   NA 133 (L) 05/04/2022   NA 131 (L) 05/03/2022   NA 133 (L) 05/03/2022  - 8/5 normal saline 75 ml/hr   Dehydration/ketosis in urine: -resume IVF for today as BP low as well -8/3 resolved     Aspiration pneumonia with CT showing-left-sided groundglass opacities  -cxr neg. -Likely aspirated. -changed unasyn to augmentin -Completed course antibiotics.   Transaminitis/Abnormal lfts:  -likely etoh  related.likely secondary to his alcohol abuse. CT scan showed cholelithiasis without evidence of cholecystitis.Hepatic steatosis noted.  Acute hepatitis panel negative.  AST/ALT 69/50 T. bili2.7 > downtrending.   -8/5 improved but still slightly elevated.   Acute metabolic encephalopathy:   likely due to metabolic derangements/alcoholism/ aspiration.non focal on exam. CT head- non acute-ammonia level normal.  Due to his alcohol abuse history started on high-dose thiamine,ciwa ativan, etoh cessation counseling.  Mental status has improved.  PTOT eval-advised skilled nursing facility -8/3 resolved   Alcohol abuse with alcohol withdrawal:  -Last alcohol use unknown patient reports 2 days PTA, drinks at least 12 beers on daily basis, possibly liquid.  -Continue high-dose thiamine, folic acid, CIWA scale Ativan prn. TOC consult for cessation.   Lactic acidosis - 7/26 resolved     Old healed left rib fractures/old clavicle fracture/chronic L1 compression fracture : Respiratory status is stable, no chest pain.  Continue incentive spirometry, pain control.     Normocytic anemia   Folic acid deficiency -Folic acid 1 mg daily  Thrombocytopenia: Suspect multifactorial hemodilution, anemia of chronic disease.  No active bleeding noted Hemoccult was negative.B12 is normal, iron normal 53 ferritin high 913.Last colonoscopy couple of years ago he says and was " okay" per him. Patient will need extensive follow-up with GI PCP and age-appropriate cancer screening as outpatient.  - po iron -8/5 resolved   Cholelithiasis:  -Noted incidentally on CT scan -No cholecystitis. Monitor.   QT prolongation:  -Likely from electrolyte imbalance repleting electrolytes aggressively.monitor in tele.    Deconditioning/debility  -Obtain PT OT eval, encourage ambulation.  SNF pending   Pressure ulcer POA on the back stage II on the left shoulder DTI  Pressure Injury 04/26/22 Back Mid Stage 2 -  Partial  thickness loss of dermis presenting as a shallow open injury with a red, pink wound bed without slough. (Active)  04/26/22 1517  Location: Back  Location Orientation: Mid  Staging: Stage 2 -  Partial thickness loss of dermis presenting as a shallow open injury with a red, pink wound bed without slough.  Wound Description (Comments):   Present on Admission: Yes  Dressing Type Foam - Lift dressing to assess site every shift 05/06/22 0728    Hypomagnesmia - Magnesium goal> 2 - 8/5 Magnesium IV 4 g  Hypocalcemia  Hypokalemia - Potassium goal> 4 - 8/3 K-Dur 50 mEq   Goals of care - 8/2 consult [facility] SNF placement    Mobility Assessment (last 72 hours)     Mobility Assessment     Row Name 05/06/22 0728 05/05/22 2134 05/05/22 1504 05/05/22 1339 05/05/22 0758   Does patient have an order for bedrest or is patient medically unstable No - Continue assessment No - Continue assessment -- -- No - Continue assessment   What is the highest level of mobility based on the progressive mobility assessment? Level 3 (Stands with assist) - Balance while standing  and cannot march in place Level 5 (Walks with assist in room/hall) - Balance while stepping forward/back and can walk in room with assist - Complete Level 5 (Walks with assist in room/hall) - Balance while stepping forward/back and can walk in room with assist - Complete Level 4 (Walks with assist in room) - Balance while marching in place and cannot step forward and back - Complete Level 3 (Stands with assist) - Balance while standing  and cannot march in place   Is the above level different from baseline mobility prior to current illness? Yes - Recommend PT order Yes - Recommend PT order -- -- Yes - Recommend PT order    Row Name 05/04/22 2100 05/04/22 2000 05/04/22 0800 05/03/22 2046 05/03/22 2035   Does patient have an order for bedrest or is patient medically unstable No - Continue assessment No - Continue assessment No - Continue assessment  No - Continue assessment No - Continue assessment   What is the highest level of mobility based on the progressive mobility assessment? Level 4 (Walks with assist in room) - Balance while marching in place and cannot step forward and back - Complete Level 4 (Walks with assist in room) - Balance while marching in place and cannot step forward and back - Complete Level 4 (Walks with assist in room) - Balance while marching in place and cannot step forward and back - Complete Level 4 (Walks with assist in room) - Balance while marching in place and cannot step forward and back - Complete Level 4 (Walks with assist in room) - Balance while marching in place and cannot step forward and back - Complete   Is the above level different from baseline mobility prior to current illness? -- Yes - Recommend PT order Yes - Recommend PT order -- --                    DVT prophylaxis: Subcu heparin Code Status: Full Family Communication:  Status is: Inpatient    Dispo: The patient is from: Home              Anticipated d/c is to: SNF  Anticipated d/c date is: 3 days              Patient currently is not medically stable to d/c.      Consultants:    Procedures/Significant Events:    I have personally reviewed and interpreted all radiology studies and my findings are as above.  VENTILATOR SETTINGS:    Cultures   Antimicrobials: Anti-infectives (From admission, onward)    Start     Ordered Stop   04/28/22 1400  amoxicillin-clavulanate (AUGMENTIN) 875-125 MG per tablet 1 tablet        04/28/22 1139 04/30/22 2139   04/25/22 1800  Ampicillin-Sulbactam (UNASYN) 3 g in sodium chloride 0.9 % 100 mL IVPB  Status:  Discontinued        04/25/22 1748 04/28/22 1139         Devices    LINES / TUBES:      Continuous Infusions:   Objective: Vitals:   05/05/22 0420 05/05/22 1610 05/06/22 0039 05/06/22 0655  BP: (!) 93/58 (!) 94/53 97/72 98/74   Pulse: 79 78 82 83   Resp: 20 20 19    Temp: 98 F (36.7 C) 98 F (36.7 C) 98.9 F (37.2 C) 98.6 F (37 C)  TempSrc: Oral Oral Oral Oral  SpO2: 100% 100% 100% 99%  Weight:      Height:        Intake/Output Summary (Last 24 hours) at 05/06/2022 1253 Last data filed at 05/06/2022 0900 Gross per 24 hour  Intake 480 ml  Output 1300 ml  Net -820 ml    Filed Weights   04/25/22 1403 05/03/22 1200  Weight: 83.9 kg 76.1 kg    Examination:  General: A/O x4, No acute respiratory distress Eyes: negative scleral hemorrhage, negative anisocoria, negative icterus ENT: Negative Runny nose, negative gingival bleeding, Neck:  Negative scars, masses, torticollis, lymphadenopathy, JVD Lungs: Clear to auscultation bilaterally without wheezes or crackles Cardiovascular: Regular rate and rhythm without murmur gallop or rub normal S1 and S2 Abdomen: negative abdominal pain, nondistended, positive soft, bowel sounds, no rebound, no ascites, no appreciable mass Extremities: No significant cyanosis, clubbing, or edema bilateral lower extremities Skin: Negative rashes, lesions, ulcers Psychiatric:  Negative depression, negative anxiety, negative fatigue, negative mania  Central nervous system:  Cranial nerves II through XII intact, tongue/uvula midline, all extremities muscle strength 5/5, sensation intact throughout, negative dysarthria, negative expressive aphasia, negative receptive aphasia.  .     Data Reviewed: Care during the described time interval was provided by me .  I have reviewed this patient's available data, including medical history, events of note, physical examination, and all test results as part of my evaluation.  CBC: Recent Labs  Lab 05/01/22 0355 05/03/22 1257 05/04/22 0337 05/05/22 0517 05/06/22 0412  WBC 8.2 7.7 7.8 7.3 7.6  NEUTROABS  --  5.2 5.4 4.8 4.9  HGB 7.6* 9.0* 8.5* 9.9* 8.8*  HCT 23.1* 27.0* 26.0* 31.4* 26.4*  MCV 101.3* 101.9* 103.6* 105.0* 100.4*  PLT 149* 306 314 319 400     Basic Metabolic Panel: Recent Labs  Lab 04/30/22 0346 05/01/22 0355 05/03/22 0333 05/03/22 1257 05/04/22 0337 05/05/22 0517 05/06/22 0412  NA 133*   < > 133* 131* 133* 134* 131*  K 3.3*   < > 3.6 3.7 3.4* 4.0 3.9  CL 102   < > 105 102 106 106 103  CO2 23   < > 19* 22 21* 20* 21*  GLUCOSE 94   < > 104* 101* 110*  92 87  BUN 9   < > 8 9 8 8 8   CREATININE 0.59*   < > 0.51* 0.46* 0.44* 0.33* 0.44*  CALCIUM 8.2*   < > 7.9* 8.0* 7.6* 8.5* 8.4*  MG 1.8  --   --  1.2* 1.9 1.6* 1.5*  PHOS  --   --   --  3.4 2.8 3.2 4.3   < > = values in this interval not displayed.    GFR: Estimated Creatinine Clearance: 112.3 mL/min (A) (by C-G formula based on SCr of 0.44 mg/dL (L)). Liver Function Tests: Recent Labs  Lab 05/03/22 1257 05/04/22 0337 05/05/22 0517 05/06/22 0412  AST 56* 42* 49* 47*  ALT 32 32 32 30  ALKPHOS 157* 145* 173* 170*  BILITOT 1.1 0.8 0.9 0.8  PROT 6.4* 6.3* 7.0 6.6  ALBUMIN 2.1* 2.1* 2.4* 2.2*    No results for input(s): "LIPASE", "AMYLASE" in the last 168 hours. Recent Labs  Lab 05/03/22 1257 05/04/22 0337 05/05/22 0517 05/06/22 0412  AMMONIA 28 25 38* 33    Coagulation Profile: No results for input(s): "INR", "PROTIME" in the last 168 hours. Cardiac Enzymes: No results for input(s): "CKTOTAL", "CKMB", "CKMBINDEX", "TROPONINI" in the last 168 hours. BNP (last 3 results) No results for input(s): "PROBNP" in the last 8760 hours. HbA1C: No results for input(s): "HGBA1C" in the last 72 hours. CBG: No results for input(s): "GLUCAP" in the last 168 hours. Lipid Profile: No results for input(s): "CHOL", "HDL", "LDLCALC", "TRIG", "CHOLHDL", "LDLDIRECT" in the last 72 hours. Thyroid Function Tests: No results for input(s): "TSH", "T4TOTAL", "FREET4", "T3FREE", "THYROIDAB" in the last 72 hours. Anemia Panel: No results for input(s): "VITAMINB12", "FOLATE", "FERRITIN", "TIBC", "IRON", "RETICCTPCT" in the last 72 hours. Sepsis Labs: No results for  input(s): "PROCALCITON", "LATICACIDVEN" in the last 168 hours.   Recent Results (from the past 240 hour(s))  C Difficile Quick Screen w PCR reflex     Status: None   Collection Time: 04/26/22  6:00 PM   Specimen: STOOL  Result Value Ref Range Status   C Diff antigen NEGATIVE NEGATIVE Final   C Diff toxin NEGATIVE NEGATIVE Final   C Diff interpretation No C. difficile detected.  Final    Comment: Performed at [facility], [place] [address] [place], [place] [address]  Gastrointestinal Panel by PCR , Stool     Status: Abnormal   Collection Time: 04/26/22  6:00 PM   Specimen: STOOL  Result Value Ref Range Status   Campylobacter species NOT DETECTED NOT DETECTED Final   Plesimonas shigelloides NOT DETECTED NOT DETECTED Final   Salmonella species NOT DETECTED NOT DETECTED Final   Yersinia enterocolitica NOT DETECTED NOT DETECTED Final   Vibrio species NOT DETECTED NOT DETECTED Final   Vibrio cholerae NOT DETECTED NOT DETECTED Final   Enteroaggregative E coli (EAEC) NOT DETECTED NOT DETECTED Final   Enteropathogenic E coli (EPEC) DETECTED (A) NOT DETECTED Final    Comment: RESULT CALLED TO, READ BACK BY AND VERIFIED WITH: DERRICK RICHTER 04/27/22 1028 KLW    Enterotoxigenic E coli (ETEC) NOT DETECTED NOT DETECTED Final   Shiga like toxin producing E coli (STEC) NOT DETECTED NOT DETECTED Final   Shigella/Enteroinvasive E coli (EIEC) NOT DETECTED NOT DETECTED Final   Cryptosporidium NOT DETECTED NOT DETECTED Final   Cyclospora cayetanensis NOT DETECTED NOT DETECTED Final   Entamoeba histolytica NOT DETECTED NOT DETECTED Final   Giardia lamblia NOT DETECTED NOT DETECTED Final   Adenovirus F40/41 NOT DETECTED  NOT DETECTED Final   Astrovirus NOT DETECTED NOT DETECTED Final   Norovirus GI/GII NOT DETECTED NOT DETECTED Final   Rotavirus A NOT DETECTED NOT DETECTED Final   Sapovirus (I, II, IV, and V) NOT DETECTED NOT DETECTED Final    Comment: Performed at [facility], [address], [place], [place] [address]         Radiology Studies: No results found.      Scheduled Meds:  vitamin C  500 mg Oral BID   feeding supplement  237 mL Oral TID BM   ferrous sulfate  325 mg Oral BID WC   folic acid  1 mg Oral Daily   heparin injection (subcutaneous)  5,000 Units Subcutaneous Q8H   multivitamin with minerals  1 tablet Oral Daily   thiamine  100 mg Oral Daily   Continuous Infusions:   LOS: 11 days    Time spent:40 min    Anisia Leija, Roselind Messier, MD [facility]   If 7PM-7AM, please contact night-coverage 05/06/2022, 12:53 PM

## 2022-05-07 DIAGNOSIS — F1020 Alcohol dependence, uncomplicated: Secondary | ICD-10-CM | POA: Diagnosis not present

## 2022-05-07 DIAGNOSIS — E871 Hypo-osmolality and hyponatremia: Secondary | ICD-10-CM | POA: Diagnosis not present

## 2022-05-07 DIAGNOSIS — G9341 Metabolic encephalopathy: Secondary | ICD-10-CM | POA: Diagnosis not present

## 2022-05-07 DIAGNOSIS — F1010 Alcohol abuse, uncomplicated: Secondary | ICD-10-CM | POA: Diagnosis not present

## 2022-05-07 LAB — COMPREHENSIVE METABOLIC PANEL
ALT: 31 U/L (ref 0–44)
AST: 54 U/L — ABNORMAL HIGH (ref 15–41)
Albumin: 2.4 g/dL — ABNORMAL LOW (ref 3.5–5.0)
Alkaline Phosphatase: 190 U/L — ABNORMAL HIGH (ref 38–126)
Anion gap: 6 (ref 5–15)
BUN: 6 mg/dL (ref 6–20)
CO2: 19 mmol/L — ABNORMAL LOW (ref 22–32)
Calcium: 8.7 mg/dL — ABNORMAL LOW (ref 8.9–10.3)
Chloride: 108 mmol/L (ref 98–111)
Creatinine, Ser: 0.42 mg/dL — ABNORMAL LOW (ref 0.61–1.24)
GFR, Estimated: >60 mL/min (ref >60)
Glucose, Bld: 97 mg/dL (ref 70–99)
Potassium: 4.5 mmol/L (ref 3.5–5.1)
Sodium: 133 mmol/L — ABNORMAL LOW (ref 135–145)
Total Bilirubin: 1 mg/dL (ref 0.3–1.2)
Total Protein: 7.2 g/dL (ref 6.5–8.1)

## 2022-05-07 LAB — CBC WITH DIFFERENTIAL/PLATELET
Abs Immature Granulocytes: 0.01 10*3/uL (ref 0.00–0.07)
Basophils Absolute: 0.1 10*3/uL (ref 0.0–0.1)
Basophils Relative: 1 %
Eosinophils Absolute: 0.3 10*3/uL (ref 0.0–0.5)
Eosinophils Relative: 4 %
HCT: 30.2 % — ABNORMAL LOW (ref 39.0–52.0)
Hemoglobin: 9.6 g/dL — ABNORMAL LOW (ref 13.0–17.0)
Immature Granulocytes: 0 %
Lymphocytes Relative: 27 %
Lymphs Abs: 1.8 10*3/uL (ref 0.7–4.0)
MCH: 33.6 pg (ref 26.0–34.0)
MCHC: 31.8 g/dL (ref 30.0–36.0)
MCV: 105.6 fL — ABNORMAL HIGH (ref 80.0–100.0)
Monocytes Absolute: 0.5 10*3/uL (ref 0.1–1.0)
Monocytes Relative: 7 %
Neutro Abs: 4.1 10*3/uL (ref 1.7–7.7)
Neutrophils Relative %: 61 %
Platelets: 346 10*3/uL (ref 150–400)
RBC: 2.86 MIL/uL — ABNORMAL LOW (ref 4.22–5.81)
RDW: 16.4 % — ABNORMAL HIGH (ref 11.5–15.5)
WBC: 6.7 10*3/uL (ref 4.0–10.5)
nRBC: 0 % (ref 0.0–0.2)

## 2022-05-07 LAB — MAGNESIUM: Magnesium: 1.8 mg/dL (ref 1.7–2.4)

## 2022-05-07 LAB — AMMONIA: Ammonia: 27 umol/L (ref 9–35)

## 2022-05-07 LAB — PHOSPHORUS: Phosphorus: 3.7 mg/dL (ref 2.5–4.6)

## 2022-05-07 NOTE — Plan of Care (Signed)
?  Problem: Clinical Measurements: ?Goal: Respiratory complications will improve ?Outcome: Progressing ?Goal: Cardiovascular complication will be avoided ?Outcome: Progressing ?  ?Problem: Nutrition: ?Goal: Adequate nutrition will be maintained ?Outcome: Progressing ?  ?Problem: Safety: ?Goal: Ability to remain free from injury will improve ?Outcome: Progressing ?  ?

## 2022-05-07 NOTE — Progress Notes (Signed)
PROGRESS NOTE    Dylan Weaver  ONG:[id number] DOB: 1966/09/13 DOA: 04/25/2022 PCP: [name]     Brief Narrative:  56 y.o.WM PMHx EtOH abuse, Hx EtOH Hepatitis, prolonged QT interval, pressure ulcer,  Brought into the ED by EMS after neighbor found the patient lying on the floor covered in feces and surrounded by empty beer cans.  Patient apparently reported to EMS that he fell 2 days prior to presentation. He was confused and history is very limited.  He had diarrhea and GI panel came back positive for enteropathogenic E. coli and resolved without treatment he had hypovolemic hyponatremia dehydration that resolved with IV fluids.  His encephalopathy has significantly improved.  He remains weak and deconditioned PT OT recommend skilled nursing facility placement      Subjective: 8/6 afebrile overnight A/O x4.  No complaints.  If [facility] not [facility] by tomorrow would like to speak with LCSW about safe home discharge.    Assessment & Plan: Covid vaccination;   Principal Problem:   Hyponatremia Active Problems:   Alcohol use disorder with alcohol withdrawal    Thrombocytopenia (HCC)   Normochromic normocytic anemia   Elevated LFTs   Prolonged QT interval   Pressure ulcer   Acute metabolic encephalopathy   Hypokalemia   Aspiration pneumonia (HCC)   Enteropathogenic Escherichia coli infection   Hypophosphatemia  Enteropathogenic E. coli diarrhea: diarrhea resolved.   -enteric precaution  -continue diet   Hypovolemic hyponatremia Lab Results  Component Value Date   NA 133 (L) 05/07/2022   NA 131 (L) 05/06/2022   NA 134 (L) 05/05/2022   NA 133 (L) 05/04/2022   NA 131 (L) 05/03/2022  - 8/5 normal saline 75 ml/hr   Dehydration/ketosis in urine: -resume IVF for today as BP low as well -8/3 resolved     Aspiration pneumonia with CT showing-left-sided groundglass opacities  -cxr neg. -Likely aspirated. -changed unasyn to  augmentin -Completed course antibiotics.   Transaminitis/Abnormal lfts:  -likely etoh related.likely secondary to his alcohol abuse. CT scan showed cholelithiasis without evidence of cholecystitis.Hepatic steatosis noted.  Acute hepatitis panel negative.  AST/ALT 69/50 T. bili2.7 > downtrending.   -8/5 improved but still slightly elevated.   Acute metabolic encephalopathy:   likely due to metabolic derangements/alcoholism/ aspiration.non focal on exam. CT head- non acute-ammonia level normal.  Due to his alcohol abuse history started on high-dose thiamine,ciwa ativan, etoh cessation counseling.  Mental status has improved.  PTOT eval-advised skilled nursing facility -8/3 resolved   Alcohol abuse with alcohol withdrawal:  -Last alcohol use unknown patient reports 2 days PTA, drinks at least 12 beers on daily basis, possibly liquid.  -Continue high-dose thiamine, folic acid, CIWA scale Ativan prn. TOC consult for cessation.   Lactic acidosis - 7/26 resolved     Old healed left rib fractures/old clavicle fracture/chronic L1 compression fracture : -Respiratory status is stable, no chest pain.  Continue incentive spirometry, pain control.     Macrocytic anemia -Normal B12, folate deficiency  Folic acid deficiency -Folic acid 1 mg daily  Thrombocytopenia: -Suspect multifactorial hemodilution, anemia of chronic disease.  No active bleeding noted -Last colonoscopy couple of years ago he says and was " okay" per him. Patient will need extensive follow-up with GI PCP and age-appropriate cancer screening as outpatient.  - po iron -8/5 resolved   Cholelithiasis:  -Noted incidentally on CT scan -No cholecystitis. Monitor.   QT prolongation:  -Likely from electrolyte imbalance repleting electrolytes aggressively.monitor in tele.  -8/6  repeat EKG in A.m.   Deconditioning/debility  -Obtain PT OT eval, encourage ambulation.  SNF pending   Pressure ulcer POA on the back stage II on the  left shoulder DTI  Pressure Injury 04/26/22 Back Mid Stage 2 -  Partial thickness loss of dermis presenting as a shallow open injury with a red, pink wound bed without slough. (Active)  04/26/22 1517  Location: Back  Location Orientation: Mid  Staging: Stage 2 -  Partial thickness loss of dermis presenting as a shallow open injury with a red, pink wound bed without slough.  Wound Description (Comments):   Present on Admission: Yes  Dressing Type Foam - Lift dressing to assess site every shift 05/07/22 0942    Hypomagnesmia - Magnesium goal> 2 - 8/5 Magnesium IV 4 g  Hypocalcemia  Hypokalemia - Potassium goal> 4 - 8/3 K-Dur 50 mEq   Goals of care - 8/2 consult [facility] SNF placement    Mobility Assessment (last 72 hours)     Mobility Assessment     Row Name 05/07/22 [id number] 05/06/22 2112 05/06/22 0728 05/05/22 2134 05/05/22 1504   Does patient have an order for bedrest or is patient medically unstable No - Continue assessment No - Continue assessment No - Continue assessment No - Continue assessment --   What is the highest level of mobility based on the progressive mobility assessment? Level 3 (Stands with assist) - Balance while standing  and cannot march in place Level 5 (Walks with assist in room/hall) - Balance while stepping forward/back and can walk in room with assist - Complete Level 3 (Stands with assist) - Balance while standing  and cannot march in place Level 5 (Walks with assist in room/hall) - Balance while stepping forward/back and can walk in room with assist - Complete Level 5 (Walks with assist in room/hall) - Balance while stepping forward/back and can walk in room with assist - Complete   Is the above level different from baseline mobility prior to current illness? Yes - Recommend PT order Yes - Recommend PT order Yes - Recommend PT order Yes - Recommend PT order --    [place] Name 05/05/22 1339 05/05/22 0758 05/04/22 2100 05/04/22 2000     Does patient have an order for  bedrest or is patient medically unstable -- No - Continue assessment No - Continue assessment No - Continue assessment    What is the highest level of mobility based on the progressive mobility assessment? Level 4 (Walks with assist in room) - Balance while marching in place and cannot step forward and back - Complete Level 3 (Stands with assist) - Balance while standing  and cannot march in place Level 4 (Walks with assist in room) - Balance while marching in place and cannot step forward and back - Complete Level 4 (Walks with assist in room) - Balance while marching in place and cannot step forward and back - Complete    Is the above level different from baseline mobility prior to current illness? -- Yes - Recommend PT order -- Yes - Recommend PT order                     DVT prophylaxis: Subcu heparin Code Status: Full Family Communication:  Status is: Inpatient    Dispo: The patient is from: Home              Anticipated d/c is to: SNF  Anticipated d/c date is: 3 days              Patient currently is not medically stable to d/c.      Consultants:    Procedures/Significant Events:    I have personally reviewed and interpreted all radiology studies and my findings are as above.  VENTILATOR SETTINGS:    Cultures   Antimicrobials: Anti-infectives (From admission, onward)    Start     Ordered Stop   04/28/22 1400  amoxicillin-clavulanate (AUGMENTIN) 875-125 MG per tablet 1 tablet        04/28/22 1139 04/30/22 2139   04/25/22 1800  Ampicillin-Sulbactam (UNASYN) 3 g in sodium chloride 0.9 % 100 mL IVPB  Status:  Discontinued        04/25/22 1748 04/28/22 1139         Devices    LINES / TUBES:      Continuous Infusions:  sodium chloride 75 mL/hr at 05/07/22 0459     Objective: Vitals:   05/06/22 1530 05/06/22 2004 05/07/22 0517 05/07/22 1346  BP: 100/80 104/61 105/66 97/70  Pulse: 80 75 66 82  Resp: 18 18 17 18   Temp: 98 F (36.7  C) 98.3 F (36.8 C) 98.7 F (37.1 C) 97.6 F (36.4 C)  TempSrc: Oral Oral  Oral  SpO2: 98% 100% 98% 98%  Weight:      Height:        Intake/Output Summary (Last 24 hours) at 05/07/2022 1502 Last data filed at 05/07/2022 1400 Gross per 24 hour  Intake 1661.1 ml  Output 1800 ml  Net -138.9 ml    Filed Weights   04/25/22 1403 05/03/22 1200  Weight: 83.9 kg 76.1 kg    Examination:  General: A/O x4, No acute respiratory distress Eyes: negative scleral hemorrhage, negative anisocoria, negative icterus ENT: Negative Runny nose, negative gingival bleeding, Neck:  Negative scars, masses, torticollis, lymphadenopathy, JVD Lungs: Clear to auscultation bilaterally without wheezes or crackles Cardiovascular: Regular rate and rhythm without murmur gallop or rub normal S1 and S2 Abdomen: negative abdominal pain, nondistended, positive soft, bowel sounds, no rebound, no ascites, no appreciable mass Extremities: No significant cyanosis, clubbing, or edema bilateral lower extremities Skin: Negative rashes, lesions, ulcers Psychiatric:  Negative depression, negative anxiety, negative fatigue, negative mania  Central nervous system:  Cranial nerves II through XII intact, tongue/uvula midline, all extremities muscle strength 5/5, sensation intact throughout, negative dysarthria, negative expressive aphasia, negative receptive aphasia.  .     Data Reviewed: Care during the described time interval was provided by me .  I have reviewed this patient's available data, including medical history, events of note, physical examination, and all test results as part of my evaluation.  CBC: Recent Labs  Lab 05/03/22 1257 05/04/22 0337 05/05/22 0517 05/06/22 0412 05/07/22 0420  WBC 7.7 7.8 7.3 7.6 6.7  NEUTROABS 5.2 5.4 4.8 4.9 4.1  HGB 9.0* 8.5* 9.9* 8.8* 9.6*  HCT 27.0* 26.0* 31.4* 26.4* 30.2*  MCV 101.9* 103.6* 105.0* 100.4* 105.6*  PLT 306 314 319 400 [id number]    Basic Metabolic Panel: Recent  Labs  Lab 05/03/22 1257 05/04/22 0337 05/05/22 0517 05/06/22 0412 05/07/22 0420  NA 131* 133* 134* 131* 133*  K 3.7 3.4* 4.0 3.9 4.5  CL 102 106 106 103 108  CO2 22 21* 20* 21* 19*  GLUCOSE 101* 110* 92 87 97  BUN 9 8 8 8 6   CREATININE 0.46* 0.44* 0.33* 0.44* 0.42*  CALCIUM 8.0* 7.6* 8.5* 8.4* 8.7*  MG 1.2* 1.9 1.6* 1.5* 1.8  PHOS 3.4 2.8 3.2 4.3 3.7    GFR: Estimated Creatinine Clearance: 112.3 mL/min (A) (by C-G formula based on SCr of 0.42 mg/dL (L)). Liver Function Tests: Recent Labs  Lab 05/03/22 1257 05/04/22 0337 05/05/22 0517 05/06/22 0412 05/07/22 0420  AST 56* 42* 49* 47* 54*  ALT 32 32 32 30 31  ALKPHOS 157* 145* 173* 170* 190*  BILITOT 1.1 0.8 0.9 0.8 1.0  PROT 6.4* 6.3* 7.0 6.6 7.2  ALBUMIN 2.1* 2.1* 2.4* 2.2* 2.4*    No results for input(s): "LIPASE", "AMYLASE" in the last 168 hours. Recent Labs  Lab 05/03/22 1257 05/04/22 0337 05/05/22 0517 05/06/22 0412 05/07/22 0420  AMMONIA 28 25 38* 33 27    Coagulation Profile: No results for input(s): "INR", "PROTIME" in the last 168 hours. Cardiac Enzymes: No results for input(s): "CKTOTAL", "CKMB", "CKMBINDEX", "TROPONINI" in the last 168 hours. BNP (last 3 results) No results for input(s): "PROBNP" in the last 8760 hours. HbA1C: No results for input(s): "HGBA1C" in the last 72 hours. CBG: No results for input(s): "GLUCAP" in the last 168 hours. Lipid Profile: No results for input(s): "CHOL", "HDL", "LDLCALC", "TRIG", "CHOLHDL", "LDLDIRECT" in the last 72 hours. Thyroid Function Tests: No results for input(s): "TSH", "T4TOTAL", "FREET4", "T3FREE", "THYROIDAB" in the last 72 hours. Anemia Panel: No results for input(s): "VITAMINB12", "FOLATE", "FERRITIN", "TIBC", "IRON", "RETICCTPCT" in the last 72 hours. Sepsis Labs: No results for input(s): "PROCALCITON", "LATICACIDVEN" in the last 168 hours.   No results found for this or any previous visit (from the past 240 hour(s)).         Radiology Studies: No results found.      Scheduled Meds:  vitamin C  500 mg Oral BID   feeding supplement  237 mL Oral TID BM   ferrous sulfate  325 mg Oral BID WC   folic acid  1 mg Oral Daily   heparin injection (subcutaneous)  5,000 Units Subcutaneous Q8H   multivitamin with minerals  1 tablet Oral Daily   thiamine  100 mg Oral Daily   Continuous Infusions:  sodium chloride 75 mL/hr at 05/07/22 0459     LOS: 12 days    Time spent:40 min    Zimere Dunlevy, Roselind Messier, MD [facility]   If 7PM-7AM, please contact night-coverage 05/07/2022, 3:02 PM

## 2022-05-08 DIAGNOSIS — G9341 Metabolic encephalopathy: Secondary | ICD-10-CM | POA: Diagnosis not present

## 2022-05-08 DIAGNOSIS — F1020 Alcohol dependence, uncomplicated: Secondary | ICD-10-CM | POA: Diagnosis not present

## 2022-05-08 DIAGNOSIS — E871 Hypo-osmolality and hyponatremia: Secondary | ICD-10-CM | POA: Diagnosis not present

## 2022-05-08 DIAGNOSIS — F1010 Alcohol abuse, uncomplicated: Secondary | ICD-10-CM | POA: Diagnosis not present

## 2022-05-08 LAB — CBC WITH DIFFERENTIAL/PLATELET
Abs Immature Granulocytes: 0.03 10*3/uL (ref 0.00–0.07)
Basophils Absolute: 0.1 10*3/uL (ref 0.0–0.1)
Basophils Relative: 2 %
Eosinophils Absolute: 0.3 10*3/uL (ref 0.0–0.5)
Eosinophils Relative: 4 %
HCT: 28.3 % — ABNORMAL LOW (ref 39.0–52.0)
Hemoglobin: 8.9 g/dL — ABNORMAL LOW (ref 13.0–17.0)
Immature Granulocytes: 0 %
Lymphocytes Relative: 33 %
Lymphs Abs: 2.2 10*3/uL (ref 0.7–4.0)
MCH: 33.1 pg (ref 26.0–34.0)
MCHC: 31.4 g/dL (ref 30.0–36.0)
MCV: 105.2 fL — ABNORMAL HIGH (ref 80.0–100.0)
Monocytes Absolute: 0.6 10*3/uL (ref 0.1–1.0)
Monocytes Relative: 9 %
Neutro Abs: 3.6 10*3/uL (ref 1.7–7.7)
Neutrophils Relative %: 52 %
Platelets: 361 10*3/uL (ref 150–400)
RBC: 2.69 MIL/uL — ABNORMAL LOW (ref 4.22–5.81)
RDW: 16 % — ABNORMAL HIGH (ref 11.5–15.5)
WBC: 6.8 10*3/uL (ref 4.0–10.5)
nRBC: 0 % (ref 0.0–0.2)

## 2022-05-08 LAB — COMPREHENSIVE METABOLIC PANEL
ALT: 31 U/L (ref 0–44)
AST: 56 U/L — ABNORMAL HIGH (ref 15–41)
Albumin: 2.3 g/dL — ABNORMAL LOW (ref 3.5–5.0)
Alkaline Phosphatase: 191 U/L — ABNORMAL HIGH (ref 38–126)
Anion gap: 5 (ref 5–15)
BUN: 8 mg/dL (ref 6–20)
CO2: 20 mmol/L — ABNORMAL LOW (ref 22–32)
Calcium: 8.3 mg/dL — ABNORMAL LOW (ref 8.9–10.3)
Chloride: 107 mmol/L (ref 98–111)
Creatinine, Ser: 0.49 mg/dL — ABNORMAL LOW (ref 0.61–1.24)
GFR, Estimated: >60 mL/min (ref >60)
Glucose, Bld: 96 mg/dL (ref 70–99)
Potassium: 3.8 mmol/L (ref 3.5–5.1)
Sodium: 132 mmol/L — ABNORMAL LOW (ref 135–145)
Total Bilirubin: 0.8 mg/dL (ref 0.3–1.2)
Total Protein: 6.8 g/dL (ref 6.5–8.1)

## 2022-05-08 LAB — AMMONIA: Ammonia: 25 umol/L (ref 9–35)

## 2022-05-08 LAB — PHOSPHORUS: Phosphorus: 3.8 mg/dL (ref 2.5–4.6)

## 2022-05-08 LAB — MAGNESIUM: Magnesium: 1.5 mg/dL — ABNORMAL LOW (ref 1.7–2.4)

## 2022-05-08 MED ORDER — MAGNESIUM SULFATE 50 % IJ SOLN
3.0000 g | Freq: Once | INTRAVENOUS | Status: AC
  Administered 2022-05-08: 3 g via INTRAVENOUS
  Filled 2022-05-08: qty 6

## 2022-05-08 NOTE — Progress Notes (Signed)
Physical Therapy Treatment Patient Details Name: Dylan Weaver MRN: [id number] DOB: 1965-10-18 Today's Date: 05/08/2022   History of Present Illness 56 y.o.m w/ history of alcohol abuse, previous history of alcoholic hepatitis brought into the ED by EMS after neighbor found the patient lying on the floor covered in feces and surrounded by empty beer cans.  Patient apparently reported to EMS that he fell 2 days prior to presentation. He was confused and history is very limited.    PT Comments    Patient making good progress with therapy and eager to progress today. Pt remains slightly impulsive but followed cues for safety consistently. Min guard provided for transfers and pt able to correct posterior lean on initial stand with use of RW. Intermittent assist for walker position during gait and cues/assist to guide turns. Pt will continue to benefit  from skilled PT interventions to progress mobility as able and ST rehab at SNF prior to return home.Will continue to progress as able.    Recommendations for follow up therapy are one component of a multi-disciplinary discharge planning process, led by the attending physician.  Recommendations may be updated based on patient status, additional functional criteria and insurance authorization.  Follow Up Recommendations  Skilled nursing-short term rehab (<3 hours/day) Can patient physically be transported by private vehicle: No   Assistance Recommended at Discharge Intermittent Supervision/Assistance  Patient can return home with the following A lot of help with walking and/or transfers;A lot of help with bathing/dressing/bathroom;Assist for transportation;Assistance with cooking/housework   Equipment Recommendations  None recommended by PT    Recommendations for Other Services       Precautions / Restrictions Precautions Precautions: Fall Precaution Comments: Impulsive Restrictions Weight Bearing Restrictions: No     Mobility   Bed Mobility Overal bed mobility: Needs Assistance Bed Mobility: Supine to Sit     Supine to sit: Supervision     General bed mobility comments: sup for safety, pt required cues to initiate and extra time. reaching out for assist and instructed to use bed rail.    Transfers Overall transfer level: Needs assistance Equipment used: Rolling walker (2 wheels) Transfers: Sit to/from Stand Sit to Stand: Min guard           General transfer comment: posterior lean in standing but able to maintain balacne with RW for support. pt reliant on bil UE use for powerup. close guard for safety with rise. mutliple from EOB and recliner.    Ambulation/Gait Ambulation/Gait assistance: Min guard, Min assist Gait Distance (Feet): 60 Feet Assistive device: Rolling walker (2 wheels) Gait Pattern/deviations: Step-through pattern, Decreased stride length, Shuffle, Decreased dorsiflexion - left, Decreased dorsiflexion - right, Wide base of support Gait velocity: decr     General Gait Details: overall pt unsteady but balance improved with cues to remain close to RW. pt with low hip flexion and decreased dorsiflexion bil, no buckling noted this session. VC's required to facilitate turning and pt taking standing rest break 2x during gait due to fatigue.   Stairs             Wheelchair Mobility    Modified Rankin (Stroke Patients Only)       Balance Overall balance assessment: Needs assistance Sitting-balance support: No upper extremity supported, Feet supported Sitting balance-Leahy Scale: Good Sitting balance - Comments: able to don socks at EOB Postural control: Posterior lean Standing balance support: Bilateral upper extremity supported, Reliant on assistive device for balance Standing balance-Leahy Scale: Poor  Cognition Arousal/Alertness: Awake/alert Behavior During Therapy: WFL for tasks assessed/performed Overall Cognitive Status:  Within Functional Limits for tasks assessed                                          Exercises Other Exercises Other Exercises: 5x sit<>stand (pt required UE use to power up) cues to avoid use of hands with lowering for eccentric LE strengthening    General Comments        Pertinent Vitals/Pain Pain Assessment Pain Assessment: No/denies pain    Home Living                          Prior Function            PT Goals (current goals can now be found in the care plan section) Acute Rehab PT Goals PT Goal Formulation: With patient Time For Goal Achievement: 05/11/22 Potential to Achieve Goals: Good Progress towards PT goals: Progressing toward goals    Frequency    Min 3X/week      PT Plan Current plan remains appropriate    Co-evaluation              AM-PAC PT "6 Clicks" Mobility   Outcome Measure  Help needed turning from your back to your side while in a flat bed without using bedrails?: A Little Help needed moving from lying on your back to sitting on the side of a flat bed without using bedrails?: A Little Help needed moving to and from a bed to a chair (including a wheelchair)?: A Little Help needed standing up from a chair using your arms (e.g., wheelchair or bedside chair)?: A Little Help needed to walk in hospital room?: A Little Help needed climbing 3-5 steps with a railing? : A Lot 6 Click Score: 17    End of Session Equipment Utilized During Treatment: Gait belt Activity Tolerance: Patient tolerated treatment well Patient left: in chair;with call bell/phone within reach;with chair alarm set Nurse Communication: Mobility status PT Visit Diagnosis: Difficulty in walking, not elsewhere classified (R26.2);Muscle weakness (generalized) (M62.81)     Time: 6063-0160 PT Time Calculation (min) (ACUTE ONLY): 21 min  Charges:  $Gait Training: 8-22 mins                     Wynn Maudlin, DPT Acute Rehabilitation  Services Office [phone] Pager [phone]  05/08/22 10:21 AM

## 2022-05-08 NOTE — TOC Progression Note (Signed)
Transition of Care [facility]) - Progression Note    Patient Details  Name: Dylan Weaver MRN: [id number] Date of Birth: 08/14/1966  [facility] CM/SW Contact  Lavenia Atlas, RN Phone Number: 05/08/2022, 2:09 PM  Clinical Narrative:   RNCM awaiting call back from [place] and SNF for acceptance.  TOC will continue to follow.    Expected Discharge Plan: Home/Self Care Barriers to Discharge: Continued Medical Work up  Expected Discharge Plan and Services Expected Discharge Plan: Home/Self Care   Discharge Planning Services: CM Consult   Living arrangements for the past 2 months: Single Family Home                                       Social Determinants of Health (SDOH) Interventions    Readmission Risk Interventions     No data to display

## 2022-05-08 NOTE — Plan of Care (Signed)
?  Problem: Clinical Measurements: ?Goal: Respiratory complications will improve ?Outcome: Progressing ?Goal: Cardiovascular complication will be avoided ?Outcome: Progressing ?  ?Problem: Safety: ?Goal: Ability to remain free from injury will improve ?Outcome: Progressing ?  ?

## 2022-05-08 NOTE — Progress Notes (Signed)
PROGRESS NOTE    Dylan Weaver  [id number] DOB: 1966-05-28 DOA: 04/25/2022 PCP: [facility]     Brief Narrative:  56 y.o.WM PMHx EtOH abuse, Hx EtOH Hepatitis, prolonged QT interval, pressure ulcer,  Brought into the ED by EMS after neighbor found the patient lying on the floor covered in feces and surrounded by empty beer cans.  Patient apparently reported to EMS that he fell 2 days prior to presentation. He was confused and history is very limited.  He had diarrhea and GI panel came back positive for enteropathogenic E. coli and resolved without treatment he had hypovolemic hyponatremia dehydration that resolved with IV fluids.  His encephalopathy has significantly improved.  He remains weak and deconditioned PT OT recommend skilled nursing facility placement      Subjective: 8/7 afebrile overnight   8/6 afebrile overnight A/O x4.  No complaints.  If [facility] not [facility] SN[facility] by tomorrow would like to speak with LCSW about safe home discharge.    Assessment & Plan: Covid vaccination;   Principal Problem:   Hyponatremia Active Problems:   Alcohol use disorder with alcohol withdrawal    Thrombocytopenia (HCC)   Normochromic normocytic anemia   Elevated LFTs   Prolonged QT interval   Pressure ulcer   Acute metabolic encephalopathy   Hypokalemia   Aspiration pneumonia (HCC)   Enteropathogenic Escherichia coli infection   Hypophosphatemia  Enteropathogenic E. coli diarrhea: diarrhea resolved.   -enteric precaution  -continue diet   Hypovolemic hyponatremia Lab Results  Component Value Date   NA 132 (L) 05/08/2022   NA 133 (L) 05/07/2022   NA 131 (L) 05/06/2022   NA 134 (L) 05/05/2022   NA 133 (L) 05/04/2022  - 8/5 normal saline 75 ml/hr   Dehydration/ketosis in urine: -resume IVF for today as BP low as well -8/3 resolved     Aspiration pneumonia with CT showing-left-sided groundglass opacities  -cxr neg. -Likely aspirated. -changed  unasyn to augmentin -Completed course antibiotics.   Transaminitis/Abnormal lfts:  -likely etoh related.likely secondary to his alcohol abuse. CT scan showed cholelithiasis without evidence of cholecystitis.Hepatic steatosis noted.  Acute hepatitis panel negative.  AST/ALT 69/50 T. bili2.7 > downtrending.   -8/5 improved but still slightly elevated.   Acute metabolic encephalopathy:   likely due to metabolic derangements/alcoholism/ aspiration.non focal on exam. CT head- non acute-ammonia level normal.  Due to his alcohol abuse history started on high-dose thiamine,ciwa ativan, etoh cessation counseling.  Mental status has improved.  PTOT eval-advised skilled nursing facility -8/3 resolved   Alcohol abuse with alcohol withdrawal:  -Last alcohol use unknown patient reports 2 days PTA, drinks at least 12 beers on daily basis, possibly liquid.  -Continue high-dose thiamine, folic acid, CIWA scale Ativan prn. TOC consult for cessation.   Lactic acidosis - 7/26 resolved     Old healed left rib fractures/old clavicle fracture/chronic L1 compression fracture : -Respiratory status is stable, no chest pain.  Continue incentive spirometry, pain control.     Macrocytic anemia -Normal B12, folate deficiency  Folic acid deficiency -Folic acid 1 mg daily  Thrombocytopenia: -Suspect multifactorial hemodilution, anemia of chronic disease.  No active bleeding noted -Last colonoscopy couple of years ago he says and was " okay" per him. Patient will need extensive follow-up with GI PCP and age-appropriate cancer screening as outpatient.  - po iron -8/5 resolved   Cholelithiasis:  -Noted incidentally on CT scan -No cholecystitis. Monitor.   QT prolongation:  -Likely from electrolyte imbalance repleting electrolytes  aggressively.monitor in tele.  -8/6 repeat EKG in A.m.   Deconditioning/debility  -Obtain PT OT eval, encourage ambulation.  SNF pending   Pressure ulcer POA on the back stage II  on the left shoulder DTI  Pressure Injury 04/26/22 Back Mid Stage 2 -  Partial thickness loss of dermis presenting as a shallow open injury with a red, pink wound bed without slough. (Active)  04/26/22 1517  Location: Back  Location Orientation: Mid  Staging: Stage 2 -  Partial thickness loss of dermis presenting as a shallow open injury with a red, pink wound bed without slough.  Wound Description (Comments):   Present on Admission: Yes  Dressing Type Foam - Lift dressing to assess site every shift 05/08/22 1100    Hypomagnesmia****** - Magnesium goal> 2 - 8/7 Magnesium IV 3 g  Hypocalcemia  Hypokalemia - Potassium goal> 4 - 8/3 K-Dur 50 mEq   Goals of care - 8/2 consult [facility] SNF placement    Mobility Assessment (last 72 hours)     Mobility Assessment     Row Name 05/08/22 0921 05/08/22 0800 05/07/22 2049 05/07/22 0942 05/06/22 2112   Does patient have an order for bedrest or is patient medically unstable -- No - Continue assessment No - Continue assessment No - Continue assessment No - Continue assessment   What is the highest level of mobility based on the progressive mobility assessment? Level 5 (Walks with assist in room/hall) - Balance while stepping forward/back and can walk in room with assist - Complete Level 5 (Walks with assist in room/hall) - Balance while stepping forward/back and can walk in room with assist - Complete Level 5 (Walks with assist in room/hall) - Balance while stepping forward/back and can walk in room with assist - Complete Level 3 (Stands with assist) - Balance while standing  and cannot march in place Level 5 (Walks with assist in room/hall) - Balance while stepping forward/back and can walk in room with assist - Complete   Is the above level different from baseline mobility prior to current illness? -- Yes - Recommend PT order Yes - Recommend PT order Yes - Recommend PT order Yes - Recommend PT order    Row Name 05/06/22 0728 05/05/22 2134 05/05/22  1504 05/05/22 1339     Does patient have an order for bedrest or is patient medically unstable No - Continue assessment No - Continue assessment -- --    What is the highest level of mobility based on the progressive mobility assessment? Level 3 (Stands with assist) - Balance while standing  and cannot march in place Level 5 (Walks with assist in room/hall) - Balance while stepping forward/back and can walk in room with assist - Complete Level 5 (Walks with assist in room/hall) - Balance while stepping forward/back and can walk in room with assist - Complete Level 4 (Walks with assist in room) - Balance while marching in place and cannot step forward and back - Complete    Is the above level different from baseline mobility prior to current illness? Yes - Recommend PT order Yes - Recommend PT order -- --                     DVT prophylaxis: Subcu heparin Code Status: Full Family Communication:  Status is: Inpatient    Dispo: The patient is from: Home              Anticipated d/c is to: SNF  Anticipated d/c date is: 3 days              Patient currently is not medically stable to d/c.      Consultants:    Procedures/Significant Events:    I have personally reviewed and interpreted all radiology studies and my findings are as above.  VENTILATOR SETTINGS:    Cultures   Antimicrobials: Anti-infectives (From admission, onward)    Start     Ordered Stop   04/28/22 1400  amoxicillin-clavulanate (AUGMENTIN) 875-125 MG per tablet 1 tablet        04/28/22 1139 04/30/22 2139   04/25/22 1800  Ampicillin-Sulbactam (UNASYN) 3 g in sodium chloride 0.9 % 100 mL IVPB  Status:  Discontinued        04/25/22 1748 04/28/22 1139         Devices    LINES / TUBES:      Continuous Infusions:  sodium chloride 75 mL/hr at 05/08/22 0550   magnesium sulfate bolus IVPB       Objective: Vitals:   05/07/22 1346 05/07/22 2041 05/08/22 0345 05/08/22 0730  BP:  97/70 94/64 104/69 102/63  Pulse: 82 73 74 72  Resp: 18 18 19    Temp: 97.6 F (36.4 C) 98.4 F (36.9 C) 97.8 F (36.6 C)   TempSrc: Oral Oral Oral   SpO2: 98% 97% 95%   Weight:      Height:        Intake/Output Summary (Last 24 hours) at 05/08/2022 1232 Last data filed at 05/08/2022 0550 Gross per 24 hour  Intake 1592.4 ml  Output 1100 ml  Net 492.4 ml    Filed Weights   04/25/22 1403 05/03/22 1200  Weight: 83.9 kg 76.1 kg    Examination:  General: A/O x4, No acute respiratory distress Eyes: negative scleral hemorrhage, negative anisocoria, negative icterus ENT: Negative Runny nose, negative gingival bleeding, Neck:  Negative scars, masses, torticollis, lymphadenopathy, JVD Lungs: Clear to auscultation bilaterally without wheezes or crackles Cardiovascular: Regular rate and rhythm without murmur gallop or rub normal S1 and S2 Abdomen: negative abdominal pain, nondistended, positive soft, bowel sounds, no rebound, no ascites, no appreciable mass Extremities: No significant cyanosis, clubbing, or edema bilateral lower extremities Skin: Negative rashes, lesions, ulcers Psychiatric:  Negative depression, negative anxiety, negative fatigue, negative mania  Central nervous system:  Cranial nerves II through XII intact, tongue/uvula midline, all extremities muscle strength 5/5, sensation intact throughout, negative dysarthria, negative expressive aphasia, negative receptive aphasia.  .     Data Reviewed: Care during the described time interval was provided by me .  I have reviewed this patient's available data, including medical history, events of note, physical examination, and all test results as part of my evaluation.  CBC: Recent Labs  Lab 05/04/22 0337 05/05/22 0517 05/06/22 0412 05/07/22 0420 05/08/22 0317  WBC 7.8 7.3 7.6 6.7 6.8  NEUTROABS 5.4 4.8 4.9 4.1 3.6  HGB 8.5* 9.9* 8.8* 9.6* 8.9*  HCT 26.0* 31.4* 26.4* 30.2* 28.3*  MCV 103.6* 105.0* 100.4* 105.6* 105.2*   PLT 314 319 400 346 361    Basic Metabolic Panel: Recent Labs  Lab 05/04/22 0337 05/05/22 0517 05/06/22 0412 05/07/22 0420 05/08/22 0317  NA 133* 134* 131* 133* 132*  K 3.4* 4.0 3.9 4.5 3.8  CL 106 106 103 108 107  CO2 21* 20* 21* 19* 20*  GLUCOSE 110* 92 87 97 96  BUN 8 8 8 6 8   CREATININE 0.44* 0.33* 0.44* 0.42* 0.49*  CALCIUM  7.6* 8.5* 8.4* 8.7* 8.3*  MG 1.9 1.6* 1.5* 1.8 1.5*  PHOS 2.8 3.2 4.3 3.7 3.8    GFR: Estimated Creatinine Clearance: 112.3 mL/min (A) (by C-G formula based on SCr of 0.49 mg/dL (L)). Liver Function Tests: Recent Labs  Lab 05/04/22 0337 05/05/22 0517 05/06/22 0412 05/07/22 0420 05/08/22 0317  AST 42* 49* 47* 54* 56*  ALT 32 32 30 31 31   ALKPHOS 145* 173* 170* 190* 191*  BILITOT 0.8 0.9 0.8 1.0 0.8  PROT 6.3* 7.0 6.6 7.2 6.8  ALBUMIN 2.1* 2.4* 2.2* 2.4* 2.3*    No results for input(s): "LIPASE", "AMYLASE" in the last 168 hours. Recent Labs  Lab 05/04/22 0337 05/05/22 0517 05/06/22 0412 05/07/22 0420 05/08/22 0317  AMMONIA 25 38* 33 27 25    Coagulation Profile: No results for input(s): "INR", "PROTIME" in the last 168 hours. Cardiac Enzymes: No results for input(s): "CKTOTAL", "CKMB", "CKMBINDEX", "TROPONINI" in the last 168 hours. BNP (last 3 results) No results for input(s): "PROBNP" in the last 8760 hours. HbA1C: No results for input(s): "HGBA1C" in the last 72 hours. CBG: No results for input(s): "GLUCAP" in the last 168 hours. Lipid Profile: No results for input(s): "CHOL", "HDL", "LDLCALC", "TRIG", "CHOLHDL", "LDLDIRECT" in the last 72 hours. Thyroid Function Tests: No results for input(s): "TSH", "T4TOTAL", "FREET4", "T3FREE", "THYROIDAB" in the last 72 hours. Anemia Panel: No results for input(s): "VITAMINB12", "FOLATE", "FERRITIN", "TIBC", "IRON", "RETICCTPCT" in the last 72 hours. Sepsis Labs: No results for input(s): "PROCALCITON", "LATICACIDVEN" in the last 168 hours.   No results found for this or any  previous visit (from the past 240 hour(s)).        Radiology Studies: No results found.      Scheduled Meds:  vitamin C  500 mg Oral BID   feeding supplement  237 mL Oral TID BM   ferrous sulfate  325 mg Oral BID WC   folic acid  1 mg Oral Daily   heparin injection (subcutaneous)  5,000 Units Subcutaneous Q8H   multivitamin with minerals  1 tablet Oral Daily   thiamine  100 mg Oral Daily   Continuous Infusions:  sodium chloride 75 mL/hr at 05/08/22 0550   magnesium sulfate bolus IVPB       LOS: 13 days    Time spent:40 min    Georgeanna Radziewicz, Geraldo Docker, MD [facility]   If 7PM-7AM, please contact night-coverage 05/08/2022, 12:32 PM

## 2022-05-09 DIAGNOSIS — G9341 Metabolic encephalopathy: Secondary | ICD-10-CM | POA: Diagnosis not present

## 2022-05-09 DIAGNOSIS — F1020 Alcohol dependence, uncomplicated: Secondary | ICD-10-CM | POA: Diagnosis not present

## 2022-05-09 DIAGNOSIS — F1010 Alcohol abuse, uncomplicated: Secondary | ICD-10-CM | POA: Diagnosis not present

## 2022-05-09 DIAGNOSIS — E871 Hypo-osmolality and hyponatremia: Secondary | ICD-10-CM | POA: Diagnosis not present

## 2022-05-09 LAB — CBC WITH DIFFERENTIAL/PLATELET
Abs Immature Granulocytes: 0.01 10*3/uL (ref 0.00–0.07)
Basophils Absolute: 0.1 10*3/uL (ref 0.0–0.1)
Basophils Relative: 2 %
Eosinophils Absolute: 0.3 10*3/uL (ref 0.0–0.5)
Eosinophils Relative: 6 %
HCT: 30.4 % — ABNORMAL LOW (ref 39.0–52.0)
Hemoglobin: 9.7 g/dL — ABNORMAL LOW (ref 13.0–17.0)
Immature Granulocytes: 0 %
Lymphocytes Relative: 33 %
Lymphs Abs: 1.9 10*3/uL (ref 0.7–4.0)
MCH: 33.2 pg (ref 26.0–34.0)
MCHC: 31.9 g/dL (ref 30.0–36.0)
MCV: 104.1 fL — ABNORMAL HIGH (ref 80.0–100.0)
Monocytes Absolute: 0.4 10*3/uL (ref 0.1–1.0)
Monocytes Relative: 7 %
Neutro Abs: 3 10*3/uL (ref 1.7–7.7)
Neutrophils Relative %: 52 %
Platelets: 409 10*3/uL — ABNORMAL HIGH (ref 150–400)
RBC: 2.92 MIL/uL — ABNORMAL LOW (ref 4.22–5.81)
RDW: 15.9 % — ABNORMAL HIGH (ref 11.5–15.5)
WBC: 5.7 10*3/uL (ref 4.0–10.5)
nRBC: 0 % (ref 0.0–0.2)

## 2022-05-09 LAB — COMPREHENSIVE METABOLIC PANEL
ALT: 33 U/L (ref 0–44)
AST: 50 U/L — ABNORMAL HIGH (ref 15–41)
Albumin: 2.4 g/dL — ABNORMAL LOW (ref 3.5–5.0)
Alkaline Phosphatase: 191 U/L — ABNORMAL HIGH (ref 38–126)
Anion gap: 7 (ref 5–15)
BUN: 6 mg/dL (ref 6–20)
CO2: 21 mmol/L — ABNORMAL LOW (ref 22–32)
Calcium: 8.5 mg/dL — ABNORMAL LOW (ref 8.9–10.3)
Chloride: 107 mmol/L (ref 98–111)
Creatinine, Ser: 0.51 mg/dL — ABNORMAL LOW (ref 0.61–1.24)
GFR, Estimated: >60 mL/min (ref >60)
Glucose, Bld: 86 mg/dL (ref 70–99)
Potassium: 3.9 mmol/L (ref 3.5–5.1)
Sodium: 135 mmol/L (ref 135–145)
Total Bilirubin: 0.8 mg/dL (ref 0.3–1.2)
Total Protein: 6.9 g/dL (ref 6.5–8.1)

## 2022-05-09 LAB — MAGNESIUM: Magnesium: 1.5 mg/dL — ABNORMAL LOW (ref 1.7–2.4)

## 2022-05-09 LAB — AMMONIA: Ammonia: 20 umol/L (ref 9–35)

## 2022-05-09 LAB — PHOSPHORUS: Phosphorus: 4 mg/dL (ref 2.5–4.6)

## 2022-05-09 MED ORDER — MAGNESIUM SULFATE 4 GM/100ML IV SOLN
4.0000 g | Freq: Once | INTRAVENOUS | Status: AC
Start: 2022-05-09 — End: 2022-05-09
  Administered 2022-05-09: 4 g via INTRAVENOUS
  Filled 2022-05-09: qty 100

## 2022-05-09 NOTE — TOC Progression Note (Signed)
Transition of Care [facility]) - Progression Note    Patient Details  Name: Dylan Weaver MRN: [id number] Date of Birth: 1966-08-05  [facility]) CM/SW Contact  Lavenia Atlas, RN Phone Number: 05/09/2022, 1:38 PM  Clinical Narrative:   Patient has received SNF declines: [facility] admission on hold, [facility]- no available beds. This RNCM has reached out to multiple other SNF within patient's [facility] options. Awaiting response.      TOC will continue to follow.   Expected Discharge Plan: Skilled Nursing Facility Barriers to Discharge: No SNF bed  Expected Discharge Plan and Services Expected Discharge Plan: Skilled Nursing Facility   Discharge Planning Services: CM Consult Post Acute Care Choice: Skilled Nursing Facility Living arrangements for the past 2 months: Single Family Home                                       Social Determinants of Health (SDOH) Interventions    Readmission Risk Interventions     No data to display

## 2022-05-09 NOTE — Progress Notes (Signed)
Occupational Therapy Treatment Patient Details Name: Dylan Weaver MRN: [id number] DOB: [birth year] Today's Date: 05/09/2022   History of present illness 56 y.o.m w/ history of alcohol abuse, previous history of alcoholic hepatitis brought into the ED by EMS after neighbor found the patient lying on the floor covered in feces and surrounded by empty beer cans.  Patient apparently reported to EMS that he fell 2 days prior to presentation. He was confused and history is very limited.   OT comments  Patient progressing and showed improved steadiness and activity tolerance with 10 reps of sit <>Stand transitions from EOB to RW and back, compared to previous session. Patient remains limited by confusion with poor safety awareness, generalized weakness and decreased activity tolerance along with deficits noted below. Pt continues to demonstrate fair rehab potential and would benefit from continued skilled OT to increase safety and independence with ADLs and functional transfers to allow pt to return home safely and reduce caregiver burden and fall risk.    Recommendations for follow up therapy are one component of a multi-disciplinary discharge planning process, led by the attending physician.  Recommendations may be updated based on patient status, additional functional criteria and insurance authorization.    Follow Up Recommendations  Skilled nursing-short term rehab (<3 hours/day)    Assistance Recommended at Discharge Frequent or constant Supervision/Assistance  Patient can return home with the following  A little help with walking and/or transfers;A lot of help with bathing/dressing/bathroom;Assistance with cooking/housework   Equipment Recommendations       Recommendations for Other Services      Precautions / Restrictions Precautions Precautions: Fall Precaution Comments: Impulsive Restrictions Weight Bearing Restrictions: No       Mobility Bed Mobility Overal bed  mobility: Needs Assistance Bed Mobility: Supine to Sit, Sit to Supine     Supine to sit: Supervision Sit to supine: Min guard        Transfers                         Balance Overall balance assessment: Needs assistance Sitting-balance support: No upper extremity supported, Feet supported Sitting balance-Leahy Scale: Good     Standing balance support: During functional activity, Reliant on assistive device for balance Standing balance-Leahy Scale: Poor                             ADL either performed or assessed with clinical judgement   ADL Overall ADL's : Needs assistance/impaired       Grooming Details (indicate cue type and reason): Pt declined grooming tasks today         Upper Body Dressing : Set up;Sitting;Cueing for sequencing Upper Body Dressing Details (indicate cue type and reason): To change gowns while seated EOB     Toilet Transfer: Min guard;Rolling walker (2 wheels) Toilet Transfer Details (indicate cue type and reason): Pt completed 10 reps of sit<->stand from EOB to RW, with 8 reps of these one right after the other as exercise. Pt does not follow cues for hands and needs RW secured to floor for safety. Pt stood ~2 min with RW to allow for completion of peri hygie enad bed linen change. Toileting- Clothing Manipulation and Hygiene: Total assistance;Bed level;Cueing for safety;Sit to/from stand;Cueing for sequencing Toileting - Clothing Manipulation Details (indicate cue type and reason): Pt reported bed level bowel movement as OT entered room. Pt required Total Assist for hygiene at bed  level and completed with pt standing and holding RW with supervision for balance and Total As for task.     Functional mobility during ADLs: Rolling walker (2 wheels);Min guard;Cueing for safety;Cueing for sequencing      Extremity/Trunk Assessment Upper Extremity Assessment Upper Extremity Assessment: Overall WFL for tasks assessed   Lower  Extremity Assessment Lower Extremity Assessment: Defer to PT evaluation   Cervical / Trunk Assessment Cervical / Trunk Assessment: Normal    Vision Patient Visual Report: No change from baseline Vision Assessment?: No apparent visual deficits   Perception     Praxis      Cognition Arousal/Alertness: Awake/alert Behavior During Therapy: WFL for tasks assessed/performed Overall Cognitive Status: Impaired/Different from baseline Area of Impairment: Following commands, Orientation, Safety/judgement, Awareness, Problem solving, Attention, Memory                 Orientation Level: Place, Time Current Attention Level: Sustained Memory: Decreased recall of precautions, Decreased short-term memory Following Commands: Follows one step commands with increased time, Follows multi-step commands inconsistently Safety/Judgement: Decreased awareness of safety, Decreased awareness of deficits Awareness: Emergent Problem Solving: Requires verbal cues, Requires tactile cues, Slow processing, Difficulty sequencing, Decreased initiation General Comments: Pt still confused with certain topics and situation. At one pooint pt reported that he was currently at home.        Exercises      Shoulder Instructions       General Comments      Pertinent Vitals/ Pain       Pain Assessment Pain Assessment: No/denies pain  Home Living                                          Prior Functioning/Environment              Frequency  Min 2X/week        Progress Toward Goals  OT Goals(current goals can now be found in the care plan section)  Progress towards OT goals: Progressing toward goals  Acute Rehab OT Goals Patient Stated Goal: "Get important things done." OT Goal Formulation: With patient Time For Goal Achievement: 05/12/22 Potential to Achieve Goals: Fair  Plan Discharge plan remains appropriate    Co-evaluation                 AM-PAC OT "6  Clicks" Daily Activity     Outcome Measure   Help from another person eating meals?: A Little Help from another person taking care of personal grooming?: A Little Help from another person toileting, which includes using toliet, bedpan, or urinal?: Total Help from another person bathing (including washing, rinsing, drying)?: A Lot Help from another person to put on and taking off regular upper body clothing?: A Little Help from another person to put on and taking off regular lower body clothing?: A Lot 6 Click Score: 14    End of Session Equipment Utilized During Treatment: Rolling walker (2 wheels);Gait belt  OT Visit Diagnosis: Unsteadiness on feet (R26.81);History of falling (Z91.81);Other symptoms and signs involving cognitive function   Activity Tolerance Patient tolerated treatment well   Patient Left in bed;with call bell/phone within reach;with nursing/sitter in room;with bed alarm set   Nurse Communication Other (comment) (Pt wanted to sit EOB. EN in with OT to assist pt back to supine.)        Time: 6578-4696 OT Time Calculation (min):  31 min  Charges: OT General Charges $OT Visit: 1 Visit OT Treatments $Self Care/Home Management : 8-22 mins $Therapeutic Activity: 8-22 mins  Victorino Dike, OT Acute Rehab Services Office: [phone] 05/09/2022  Theodoro Clock 05/09/2022, 1:40 PM

## 2022-05-09 NOTE — Progress Notes (Signed)
PROGRESS NOTE    Dylan Weaver  JJH:[id number] DOB: [birth year] DOA: 04/25/2022 PCP: [name]     Brief Narrative:  56 y.o.WM PMHx EtOH abuse, Hx EtOH Hepatitis, prolonged QT interval, pressure ulcer,  Brought into the ED by EMS after neighbor found the patient lying on the floor covered in feces and surrounded by empty beer cans.  Patient apparently reported to EMS that he fell 2 days prior to presentation. He was confused and history is very limited.  He had diarrhea and GI panel came back positive for enteropathogenic E. coli and resolved without treatment he had hypovolemic hyponatremia dehydration that resolved with IV fluids.  His encephalopathy has significantly improved.  He remains weak and deconditioned PT OT recommend skilled nursing facility placement      Subjective: 8/8 afebrile overnight, A/O x 4.  Becoming frustrated with [facility] not [facility].   Assessment & Plan: Covid vaccination;   Principal Problem:   Hyponatremia Active Problems:   Alcohol use disorder with alcohol withdrawal    Thrombocytopenia (HCC)   Normochromic normocytic anemia   Elevated LFTs   Prolonged QT interval   Pressure ulcer   Acute metabolic encephalopathy   Hypokalemia   Aspiration pneumonia (HCC)   Enteropathogenic Escherichia coli infection   Hypophosphatemia  Enteropathogenic E. coli diarrhea: diarrhea resolved.   -enteric precaution  -continue diet   Hypovolemic hyponatremia Lab Results  Component Value Date   NA 132 (L) 05/08/2022   NA 133 (L) 05/07/2022   NA 131 (L) 05/06/2022   NA 134 (L) 05/05/2022   NA 133 (L) 05/04/2022  - 8/5 normal saline 75 ml/hr   Dehydration/ketosis in urine: -resume IVF for today as BP low as well -8/3 resolved     Aspiration pneumonia with CT showing-left-sided groundglass opacities  -cxr neg. -Likely aspirated. -changed unasyn to augmentin -Completed course antibiotics.   Transaminitis/Abnormal lfts:   -likely etoh related.likely secondary to his alcohol abuse. CT scan showed cholelithiasis without evidence of cholecystitis.Hepatic steatosis noted.  Acute hepatitis panel negative.  AST/ALT 69/50 T. bili2.7 > downtrending.   -8/5 improved but still slightly elevated.   Acute metabolic encephalopathy:   likely due to metabolic derangements/alcoholism/ aspiration.non focal on exam. CT head- non acute-ammonia level normal.  Due to his alcohol abuse history started on high-dose thiamine,ciwa ativan, etoh cessation counseling.  Mental status has improved.  PTOT eval-advised skilled nursing facility -8/3 resolved   Alcohol abuse with alcohol withdrawal:  -Last alcohol use unknown patient reports 2 days PTA, drinks at least 12 beers on daily basis, possibly liquid.  -Continue high-dose thiamine, folic acid, CIWA scale Ativan prn. TOC consult for cessation.   Lactic acidosis - 7/26 resolved     Old healed left rib fractures/old clavicle fracture/chronic L1 compression fracture : -Respiratory status is stable, no chest pain.  Continue incentive spirometry, pain control.     Macrocytic anemia -Normal B12, folate deficiency  Folic acid deficiency -Folic acid 1 mg daily  Thrombocytopenia: -Suspect multifactorial hemodilution, anemia of chronic disease.  No active bleeding noted -Last colonoscopy couple of years ago he says and was " okay" per him. Patient will need extensive follow-up with GI PCP and age-appropriate cancer screening as outpatient.  - po iron -8/5 resolved   Cholelithiasis:  -Noted incidentally on CT scan -No cholecystitis. Monitor.   QT prolongation:  -Likely from electrolyte imbalance repleting electrolytes aggressively.monitor in tele.  -8/6 repeat EKG in A.m.   Deconditioning/debility  -Obtain PT OT eval, encourage ambulation.  SNF pending   Pressure ulcer POA on the back stage II on the left shoulder DTI  Pressure Injury 04/26/22 Back Mid Stage 2 -  Partial  thickness loss of dermis presenting as a shallow open injury with a red, pink wound bed without slough. (Active)  04/26/22 1517  Location: Back  Location Orientation: Mid  Staging: Stage 2 -  Partial thickness loss of dermis presenting as a shallow open injury with a red, pink wound bed without slough.  Wound Description (Comments):   Present on Admission: Yes  Dressing Type Foam - Lift dressing to assess site every shift 05/09/22 0604    Hypomagnesmia - Magnesium goal> 2 - 8/8 Magnesium IV 4 g  Hypocalcemia  Hypokalemia - Potassium goal> 4 - 8/3 K-Dur 50 mEq   Goals of care - 8/2 consult TOC SNF placement spoke with NCM Winnie awaiting word from [place] on placement. -8/8 Chat to  NCM Winnie  patient states that if [facility] does not authorize SNF by tomorrow would like to be discharged home.    Mobility Assessment (last 72 hours)     Mobility Assessment     Row Name 05/09/22 0604 05/08/22 2011 05/08/22 0921 05/08/22 0800 05/07/22 2049   Does patient have an order for bedrest or is patient medically unstable No - Continue assessment No - Continue assessment -- No - Continue assessment No - Continue assessment   What is the highest level of mobility based on the progressive mobility assessment? Level 5 (Walks with assist in room/hall) - Balance while stepping forward/back and can walk in room with assist - Complete Level 5 (Walks with assist in room/hall) - Balance while stepping forward/back and can walk in room with assist - Complete Level 5 (Walks with assist in room/hall) - Balance while stepping forward/back and can walk in room with assist - Complete Level 5 (Walks with assist in room/hall) - Balance while stepping forward/back and can walk in room with assist - Complete Level 5 (Walks with assist in room/hall) - Balance while stepping forward/back and can walk in room with assist - Complete   Is the above level different from baseline mobility prior to current illness? Yes - Recommend PT  order Yes - Recommend PT order -- Yes - Recommend PT order Yes - Recommend PT order    [place] Name 05/07/22 [id number] 05/06/22 2112         Does patient have an order for bedrest or is patient medically unstable No - Continue assessment No - Continue assessment      What is the highest level of mobility based on the progressive mobility assessment? Level 3 (Stands with assist) - Balance while standing  and cannot march in place Level 5 (Walks with assist in room/hall) - Balance while stepping forward/back and can walk in room with assist - Complete      Is the above level different from baseline mobility prior to current illness? Yes - Recommend PT order Yes - Recommend PT order                       DVT prophylaxis: Subcu heparin Code Status: Full Family Communication:  Status is: Inpatient    Dispo: The patient is from: Home              Anticipated d/c is to: SNF              Anticipated d/c date is: 3 days  Patient currently is not medically stable to d/c.      Consultants:    Procedures/Significant Events:    I have personally reviewed and interpreted all radiology studies and my findings are as above.  VENTILATOR SETTINGS:    Cultures   Antimicrobials: Anti-infectives (From admission, onward)    Start     Ordered Stop   04/28/22 1400  amoxicillin-clavulanate (AUGMENTIN) 875-125 MG per tablet 1 tablet        04/28/22 1139 04/30/22 2139   04/25/22 1800  Ampicillin-Sulbactam (UNASYN) 3 g in sodium chloride 0.9 % 100 mL IVPB  Status:  Discontinued        04/25/22 1748 04/28/22 1139         Devices    LINES / TUBES:      Continuous Infusions:  sodium chloride 75 mL/hr at 05/08/22 2007     Objective: Vitals:   05/08/22 0345 05/08/22 0730 05/08/22 2011 05/09/22 0420  BP: 104/69 102/63 (!) 95/58 96/64  Pulse: 74 72 70 69  Resp: 19  18 18   Temp: 97.8 F (36.6 C)  97.8 F (36.6 C) 98.8 F (37.1 C)  TempSrc: Oral  Oral Oral   SpO2: 95%  96% 97%  Weight:      Height:        Intake/Output Summary (Last 24 hours) at 05/09/2022 [id number] Last data filed at 05/09/2022 0600 Gross per 24 hour  Intake 2956.08 ml  Output 1200 ml  Net 1756.08 ml    Filed Weights   04/25/22 1403 05/03/22 1200  Weight: 83.9 kg 76.1 kg    Examination:  General: A/O x4, No acute respiratory distress Eyes: negative scleral hemorrhage, negative anisocoria, negative icterus ENT: Negative Runny nose, negative gingival bleeding, Neck:  Negative scars, masses, torticollis, lymphadenopathy, JVD Lungs: Clear to auscultation bilaterally without wheezes or crackles Cardiovascular: Regular rate and rhythm without murmur gallop or rub normal S1 and S2 Abdomen: negative abdominal pain, nondistended, positive soft, bowel sounds, no rebound, no ascites, no appreciable mass Extremities: No significant cyanosis, clubbing, or edema bilateral lower extremities Skin: Negative rashes, lesions, ulcers Psychiatric:  Negative depression, negative anxiety, negative fatigue, negative mania  Central nervous system:  Cranial nerves II through XII intact, tongue/uvula midline, all extremities muscle strength 5/5, sensation intact throughout, negative dysarthria, negative expressive aphasia, negative receptive aphasia.  .     Data Reviewed: Care during the described time interval was provided by me .  I have reviewed this patient's available data, including medical history, events of note, physical examination, and all test results as part of my evaluation.  CBC: Recent Labs  Lab 05/04/22 0337 05/05/22 0517 05/06/22 0412 05/07/22 0420 05/08/22 0317  WBC 7.8 7.3 7.6 6.7 6.8  NEUTROABS 5.4 4.8 4.9 4.1 3.6  HGB 8.5* 9.9* 8.8* 9.6* 8.9*  HCT 26.0* 31.4* 26.4* 30.2* 28.3*  MCV 103.6* 105.0* 100.4* 105.6* 105.2*  PLT 314 319 400 346 [id number]    Basic Metabolic Panel: Recent Labs  Lab 05/04/22 0337 05/05/22 0517 05/06/22 0412 05/07/22 0420 05/08/22 0317   NA 133* 134* 131* 133* 132*  K 3.4* 4.0 3.9 4.5 3.8  CL 106 106 103 108 107  CO2 21* 20* 21* 19* 20*  GLUCOSE 110* 92 87 97 96  BUN 8 8 8 6 8   CREATININE 0.44* 0.33* 0.44* 0.42* 0.49*  CALCIUM 7.6* 8.5* 8.4* 8.7* 8.3*  MG 1.9 1.6* 1.5* 1.8 1.5*  PHOS 2.8 3.2 4.3 3.7 3.8    GFR: Estimated Creatinine Clearance:  112.3 mL/min (A) (by C-G formula based on SCr of 0.49 mg/dL (L)). Liver Function Tests: Recent Labs  Lab 05/04/22 0337 05/05/22 0517 05/06/22 0412 05/07/22 0420 05/08/22 0317  AST 42* 49* 47* 54* 56*  ALT 32 32 30 31 31   ALKPHOS 145* 173* 170* 190* 191*  BILITOT 0.8 0.9 0.8 1.0 0.8  PROT 6.3* 7.0 6.6 7.2 6.8  ALBUMIN 2.1* 2.4* 2.2* 2.4* 2.3*    No results for input(s): "LIPASE", "AMYLASE" in the last 168 hours. Recent Labs  Lab 05/04/22 0337 05/05/22 0517 05/06/22 0412 05/07/22 0420 05/08/22 0317  AMMONIA 25 38* 33 27 25    Coagulation Profile: No results for input(s): "INR", "PROTIME" in the last 168 hours. Cardiac Enzymes: No results for input(s): "CKTOTAL", "CKMB", "CKMBINDEX", "TROPONINI" in the last 168 hours. BNP (last 3 results) No results for input(s): "PROBNP" in the last 8760 hours. HbA1C: No results for input(s): "HGBA1C" in the last 72 hours. CBG: No results for input(s): "GLUCAP" in the last 168 hours. Lipid Profile: No results for input(s): "CHOL", "HDL", "LDLCALC", "TRIG", "CHOLHDL", "LDLDIRECT" in the last 72 hours. Thyroid Function Tests: No results for input(s): "TSH", "T4TOTAL", "FREET4", "T3FREE", "THYROIDAB" in the last 72 hours. Anemia Panel: No results for input(s): "VITAMINB12", "FOLATE", "FERRITIN", "TIBC", "IRON", "RETICCTPCT" in the last 72 hours. Sepsis Labs: No results for input(s): "PROCALCITON", "LATICACIDVEN" in the last 168 hours.   No results found for this or any previous visit (from the past 240 hour(s)).        Radiology Studies: No results found.      Scheduled Meds:  vitamin C  500 mg Oral BID    feeding supplement  237 mL Oral TID BM   ferrous sulfate  325 mg Oral BID WC   folic acid  1 mg Oral Daily   heparin injection (subcutaneous)  5,000 Units Subcutaneous Q8H   multivitamin with minerals  1 tablet Oral Daily   thiamine  100 mg Oral Daily   Continuous Infusions:  sodium chloride 75 mL/hr at 05/08/22 2007     LOS: 14 days    Time spent:40 min    Nitika Jackowski, 2008, MD [facility]   If 7PM-7AM, please contact night-coverage 05/09/2022, 7:48 AM

## 2022-05-10 DIAGNOSIS — E871 Hypo-osmolality and hyponatremia: Secondary | ICD-10-CM | POA: Diagnosis not present

## 2022-05-10 LAB — CBC WITH DIFFERENTIAL/PLATELET
Abs Immature Granulocytes: 0.02 10*3/uL (ref 0.00–0.07)
Basophils Absolute: 0.1 10*3/uL (ref 0.0–0.1)
Basophils Relative: 2 %
Eosinophils Absolute: 0.4 10*3/uL (ref 0.0–0.5)
Eosinophils Relative: 6 %
HCT: 30.1 % — ABNORMAL LOW (ref 39.0–52.0)
Hemoglobin: 9.5 g/dL — ABNORMAL LOW (ref 13.0–17.0)
Immature Granulocytes: 0 %
Lymphocytes Relative: 33 %
Lymphs Abs: 2.3 10*3/uL (ref 0.7–4.0)
MCH: 33.5 pg (ref 26.0–34.0)
MCHC: 31.6 g/dL (ref 30.0–36.0)
MCV: 106 fL — ABNORMAL HIGH (ref 80.0–100.0)
Monocytes Absolute: 0.6 10*3/uL (ref 0.1–1.0)
Monocytes Relative: 8 %
Neutro Abs: 3.5 10*3/uL (ref 1.7–7.7)
Neutrophils Relative %: 51 %
Platelets: 337 10*3/uL (ref 150–400)
RBC: 2.84 MIL/uL — ABNORMAL LOW (ref 4.22–5.81)
RDW: 16 % — ABNORMAL HIGH (ref 11.5–15.5)
WBC: 6.8 10*3/uL (ref 4.0–10.5)
nRBC: 0 % (ref 0.0–0.2)

## 2022-05-10 LAB — COMPREHENSIVE METABOLIC PANEL
ALT: 30 U/L (ref 0–44)
AST: 57 U/L — ABNORMAL HIGH (ref 15–41)
Albumin: 2.3 g/dL — ABNORMAL LOW (ref 3.5–5.0)
Alkaline Phosphatase: 198 U/L — ABNORMAL HIGH (ref 38–126)
Anion gap: 6 (ref 5–15)
BUN: 8 mg/dL (ref 6–20)
CO2: 23 mmol/L (ref 22–32)
Calcium: 8.2 mg/dL — ABNORMAL LOW (ref 8.9–10.3)
Chloride: 108 mmol/L (ref 98–111)
Creatinine, Ser: 0.45 mg/dL — ABNORMAL LOW (ref 0.61–1.24)
GFR, Estimated: >60 mL/min (ref >60)
Glucose, Bld: 105 mg/dL — ABNORMAL HIGH (ref 70–99)
Potassium: 4.4 mmol/L (ref 3.5–5.1)
Sodium: 137 mmol/L (ref 135–145)
Total Bilirubin: 0.9 mg/dL (ref 0.3–1.2)
Total Protein: 6.7 g/dL (ref 6.5–8.1)

## 2022-05-10 LAB — AMMONIA: Ammonia: 29 umol/L (ref 9–35)

## 2022-05-10 LAB — MAGNESIUM: Magnesium: 2 mg/dL (ref 1.7–2.4)

## 2022-05-10 LAB — PHOSPHORUS: Phosphorus: 3.7 mg/dL (ref 2.5–4.6)

## 2022-05-10 MED ORDER — FOLIC ACID 1 MG PO TABS: 1.0000 mg | ORAL_TABLET | Freq: Every day | ORAL | 1 refills | Status: DC

## 2022-05-10 MED ORDER — ASCORBIC ACID 500 MG PO TABS: 500.0000 mg | ORAL_TABLET | Freq: Two times a day (BID) | ORAL | 1 refills | Status: DC

## 2022-05-10 MED ORDER — THIAMINE HCL 100 MG PO TABS: 100.0000 mg | ORAL_TABLET | Freq: Every day | ORAL | 1 refills | Status: DC

## 2022-05-10 MED ORDER — FERROUS SULFATE 325 (65 FE) MG PO TABS: 325.0000 mg | ORAL_TABLET | Freq: Two times a day (BID) | ORAL | 1 refills | Status: DC

## 2022-05-10 NOTE — Progress Notes (Signed)
   05/10/22 1630  What Happened  Was fall witnessed? No  Was patient injured? No  Patient found on floor  Found by Staff-comment (Anissa NT)  Stated prior activity ambulating-unassisted  Follow Up  MD notified Alvino Chapel  Time MD notified 1630  Family notified No - patient refusal  Time family notified  (N/A)  Additional tests No  Simple treatment  (none)  Progress note created (see row info) Yes  Adult Fall Risk Assessment  Risk Factor Category (scoring not indicated) High fall risk per protocol (document High fall risk)  Age 56  Fall History: Fall within 6 months prior to admission 5  Elimination; Bowel and/or Urine Incontinence 0  Elimination; Bowel and/or Urine Urgency/Frequency 2  Medications: includes PCA/Opiates, Anti-convulsants, Anti-hypertensives, Diuretics, Hypnotics, Laxatives, Sedatives, and Psychotropics 5  Patient Care Equipment 2  Mobility-Assistance 2  Mobility-Gait 2  Mobility-Sensory Deficit 2  Altered awareness of immediate physical environment 0  Impulsiveness 0  Lack of understanding of one's physical/cognitive limitations 0  Total Score 20  Patient Fall Risk Level High fall risk  Adult Fall Risk Interventions  Required Bundle Interventions *See Row Information* High fall risk - low, moderate, and high requirements implemented  Additional Interventions Use of appropriate toileting equipment (bedpan, BSC, etc.);Room near nurses station  Screening for Fall Injury Risk (To be completed on HIGH fall risk patients) - Assessing Need for Floor Mats  Risk For Fall Injury- Criteria for Floor Mats Previous fall this admission  Will Implement Floor Mats Yes  Pain Assessment  Pain Scale 0-10  Pain Score 0  PCA/Epidural/Spinal Assessment  Respiratory Pattern Regular;Unlabored  Neurological  Neuro (WDL) X  Level of Consciousness Alert  Orientation Level Oriented X4  Cognition Follows commands;Poor judgement  Speech Clear  Neuro Symptoms Forgetful  Musculoskeletal   Musculoskeletal (WDL) X  Assistive Device Front wheel walker  Generalized Weakness Yes  Weight Bearing Restrictions No  Integumentary  Integumentary (WDL) X  Skin Color Appropriate for ethnicity  Skin Condition Dry  Skin Integrity Ecchymosis;Rash  Ecchymosis Location Arm;Leg  Ecchymosis Location Orientation Bilateral;Lower  Erythema/Redness Location Buttocks  Erythema/Redness Location Orientation Bilateral  Rash Location Back  Rash Location Orientation Lower

## 2022-05-10 NOTE — Progress Notes (Signed)
Physical Therapy Treatment Patient Details Name: Dylan Weaver MRN: [id number] DOB: 08/13/1966 Today's Date: 05/10/2022   History of Present Illness 55 y.o.m w/ history of alcohol abuse, previous history of alcoholic hepatitis brought into the ED by EMS after neighbor found the patient lying on the floor covered in feces and surrounded by empty beer cans.  Patient apparently reported to EMS that he fell 2 days prior to presentation. He was confused and history is very limited.    PT Comments    Patient resting in bed and required assist with clean up from BM incontinence. Pt able to perform peri-care in side-lying independently and transfer sit<>stand at EOB with min guard and RW to steady balance. Pt ambulated increased distance of ~200' with min assist for intermittent LOB while negotiating obstacles (bed, doorway, table, carts) but overall steady in clear environment. He completed functional LE strengthening at EOS. Will progress pt as able, continue to recommend SNF for ST rehab.    Recommendations for follow up therapy are one component of a multi-disciplinary discharge planning process, led by the attending physician.  Recommendations may be updated based on patient status, additional functional criteria and insurance authorization.  Follow Up Recommendations  Skilled nursing-short term rehab (<3 hours/day) Can patient physically be transported by private vehicle: No   Assistance Recommended at Discharge Intermittent Supervision/Assistance  Patient can return home with the following A lot of help with walking and/or transfers;A lot of help with bathing/dressing/bathroom;Assist for transportation;Assistance with cooking/housework   Equipment Recommendations  None recommended by PT    Recommendations for Other Services       Precautions / Restrictions Precautions Precautions: Fall Precaution Comments: Impulsive Restrictions Weight Bearing Restrictions: No      Mobility  Bed Mobility Overal bed mobility: Needs Assistance Bed Mobility: Supine to Sit     Supine to sit: Supervision     General bed mobility comments: sup for safety, pt required cues to initiate and extra time.    Transfers Overall transfer level: Needs assistance Equipment used: Rolling walker (2 wheels) Transfers: Sit to/from Stand Sit to Stand: Min guard           General transfer comment: posterior lean in standing but able to maintain balacne with RW for support. pt reliant on bil UE use for powerup. close guard for safety with rise. from EOB and recliner.    Ambulation/Gait Ambulation/Gait assistance: Min guard, Min assist Gait Distance (Feet): 200 Feet Assistive device: Rolling walker (2 wheels) Gait Pattern/deviations: Step-through pattern, Decreased stride length, Decreased dorsiflexion - left, Decreased dorsiflexion - right, Wide base of support Gait velocity: decr     General Gait Details: pt with mutliple episodes of Rt/posterior LOB in room while ambulated to bathroom and around obstacles (bed, table, BSC). balance more stable in clear environment of hallway.   Stairs             Wheelchair Mobility    Modified Rankin (Stroke Patients Only)       Balance Overall balance assessment: Needs assistance Sitting-balance support: No upper extremity supported, Feet supported Sitting balance-Leahy Scale: Good Sitting balance - Comments: pt able to complete pericare independently sitting EOBin partway sidelying to be able to reach area easily. (pt had BM in bed and needed to clean up) Postural control: Posterior lean Standing balance support: Bilateral upper extremity supported, Reliant on assistive device for balance Standing balance-Leahy Scale: Poor  Cognition Arousal/Alertness: Awake/alert Behavior During Therapy: WFL for tasks assessed/performed Overall Cognitive Status: Impaired/Different from  baseline Area of Impairment: Following commands, Safety/judgement, Awareness, Problem solving, Attention, Memory                   Current Attention Level: Selective Memory: Decreased recall of precautions, Decreased short-term memory Following Commands: Follows one step commands with increased time, Follows multi-step commands inconsistently Safety/Judgement: Decreased awareness of safety, Decreased awareness of deficits Awareness: Emergent Problem Solving: Requires verbal cues, Difficulty sequencing General Comments: pt remains slightly impulsive but follows commands/cues        Exercises Other Exercises Other Exercises: 10x sit<>stand (pt required UE use to power up) cues to avoid use of hands with lowering for eccentric LE strengthening Other Exercises: 10x heel raises in standing    General Comments        Pertinent Vitals/Pain Pain Assessment Pain Assessment: No/denies pain    Home Living                          Prior Function            PT Goals (current goals can now be found in the care plan section) Acute Rehab PT Goals PT Goal Formulation: With patient Time For Goal Achievement: 05/11/22 Potential to Achieve Goals: Good Progress towards PT goals: Progressing toward goals    Frequency    Min 3X/week      PT Plan Current plan remains appropriate    Co-evaluation              AM-PAC PT "6 Clicks" Mobility   Outcome Measure  Help needed turning from your back to your side while in a flat bed without using bedrails?: A Little Help needed moving from lying on your back to sitting on the side of a flat bed without using bedrails?: A Little Help needed moving to and from a bed to a chair (including a wheelchair)?: A Little Help needed standing up from a chair using your arms (e.g., wheelchair or bedside chair)?: A Little Help needed to walk in hospital room?: A Little Help needed climbing 3-5 steps with a railing? : A Lot 6  Click Score: 17    End of Session Equipment Utilized During Treatment: Gait belt Activity Tolerance: Patient tolerated treatment well Patient left: in chair;with call bell/phone within reach;with chair alarm set Nurse Communication: Mobility status PT Visit Diagnosis: Difficulty in walking, not elsewhere classified (R26.2);Muscle weakness (generalized) (M62.81)     Time: 1497-0263 PT Time Calculation (min) (ACUTE ONLY): 26 min  Charges:  $Gait Training: 8-22 mins $Therapeutic Exercise: 8-22 mins                     Wynn Maudlin, DPT Acute Rehabilitation Services Office [phone] Pager [phone]  05/10/22 11:22 AM

## 2022-05-10 NOTE — Progress Notes (Signed)
  PROGRESS NOTE  Patient had an episode of unwitnessed fall this afternoon, states he was trying to get his phone and tripped. No injuries and did not hit his head.   Spoke with charge unit nurse regarding safety in returning home. Feels that we need a better plan with closer monitoring at home, since he does not have SNF options, esp with falling again here.    Noralee Stain, DO [facility] 05/10/2022, 4:55 PM  Available via Epic secure chat 7am-7pm After these hours, please refer to coverage provider listed on [email or web address]

## 2022-05-10 NOTE — TOC Transition Note (Addendum)
Transition of Care [facility]) - CM/SW Discharge Note   Patient Details  Name: Dylan Weaver MRN: [id number] Date of Birth: 1966-03-19  Transition of [facility] CM/SW Contact:  Lavenia Atlas, RN Phone Number: 05/10/2022, 4:18 PM   Clinical Narrative:   No SNF bed offers for patient, will discharge home with [facility] PT/OT via [facility]. Patient and aunt notified. RNCM spoke with patient's aunt Vickie who will pick patient up at discharge. RN Amil Amen notified to call when patient is ready for discharge. MD notified of need for HHPT/OT orders. Substance abuse resources attached to AVS.  No additional TOC needs at this time.        Barriers to Discharge: No Barriers Identified   Patient Goals and CMS Choice Patient states their goals for this hospitalization and ongoing recovery are:: Home with [facility] services CMS Medicare.gov Compare Post Acute Care list provided to:: Patient Choice offered to / list presented to : Patient (Patient is [facility] and has placement at select SNF)  Discharge Placement  Home with HHPT/OT    Discharge Plan and Services   Discharge Planning Services: CM Consult Post Acute Care Choice: Skilled Nursing Facility                    HH Arranged: PT, OT [facility] Agency: [facility] Date [facility] Agency Contacted: 05/10/22   Representative spoke with at [facility] Agency: Morrie Sheldon  Social Determinants of Health (SDOH) Interventions     Readmission Risk Interventions     No data to display

## 2022-05-10 NOTE — Discharge Summary (Signed)
Physician Discharge Summary  Dylan Weaver IOE:[id number] DOB: [birth year] DOA: 04/25/2022  PCP: [name]  Admit date: 04/25/2022 Discharge date: 05/10/2022  Admitted From: Home Disposition:  Home with home health, no SNF beds were accepting   Recommendations for Outpatient Follow-up:  Follow up with PCP in 1 week in [place]   Discharge Condition: Stable CODE STATUS: Full  Diet recommendation:  Diet Orders (From admission, onward)     Start     Ordered   05/10/22 0000  Diet general        05/10/22 1622   04/26/22 1543  Diet regular Room service appropriate? Yes; Fluid consistency: Thin  Diet effective now       Question Answer Comment  Room service appropriate? Yes   Fluid consistency: Thin      04/26/22 1542           Brief/Interim Summary: Dylan Weaver is a 56 y.o.male with PMHx EtOH abuse, Hx EtOH Hepatitis, prolonged QT interval, pressure ulcer.    Brought into the ED by EMS after neighbor found the patient lying on the floor covered in feces and surrounded by empty beer cans.  Patient apparently reported to EMS that he fell 2 days prior to presentation. He was confused and history is very limited.  He had diarrhea and GI panel came back positive for enteropathogenic E. coli and resolved without treatment. He had hypovolemic hyponatremia and dehydration that resolved with IV fluids.  His encephalopathy has significantly improved.  He remains weak and deconditioned PT OT recommend skilled nursing facility placement, but it was a difficult placement due to lack of bed availability and working through [place]. Patient agreeable to discharge home with family and home health services.   Discharge Diagnoses:  Principal Problem:   Hyponatremia Active Problems:   Alcohol use disorder with alcohol withdrawal    Thrombocytopenia (HCC)   Normochromic normocytic anemia   Elevated LFTs   Prolonged QT interval   Pressure ulcer   Acute metabolic  encephalopathy   Hypokalemia   Aspiration pneumonia (HCC)   Enteropathogenic Escherichia coli infection   Hypophosphatemia   Enteropathogenic E. coli diarrhea -Improved  Hypovolemic hyponatremia -Resolved. Stop IVF    Aspiration pneumonia -Completed antibiotic course  Transaminitis  -Likely alcohol related.  Acute hepatitis panel was negative -Improved   Thrombocytopenia -Resolved   QT prolongation -Resolved    Deconditioning/debility -SNF placement unsuccessful. Discharge home with home health    In agreement with assessment of the pressure ulcer as below:  Pressure Injury 04/26/22 Back Mid Stage 2 -  Partial thickness loss of dermis presenting as a shallow open injury with a red, pink wound bed without slough. (Active)  04/26/22 1517  Location: Back  Location Orientation: Mid  Staging: Stage 2 -  Partial thickness loss of dermis presenting as a shallow open injury with a red, pink wound bed without slough.  Wound Description (Comments):   Present on Admission: Yes  Dressing Type Foam - Lift dressing to assess site every shift 05/09/22 2020    Nutrition Problem: Inadequate oral intake Etiology: acute illness  Discharge Instructions  Discharge Instructions     Call MD for:  difficulty breathing, headache or visual disturbances   Complete by: As directed    Call MD for:  extreme fatigue   Complete by: As directed    Call MD for:  hives   Complete by: As directed    Call MD for:  persistant dizziness or light-headedness   Complete  by: As directed    Call MD for:  persistant nausea and vomiting   Complete by: As directed    Call MD for:  severe uncontrolled pain   Complete by: As directed    Call MD for:  temperature >100.4   Complete by: As directed    Diet general   Complete by: As directed    Discharge instructions   Complete by: As directed    You were cared for by a hospitalist during your hospital stay. If you have any questions about your  discharge medications or the care you received while you were in the hospital after you are discharged, you can call the unit and ask to speak with the hospitalist on call if the hospitalist that took care of you is not available. Once you are discharged, your primary care physician will handle any further medical issues. Please note that NO REFILLS for any discharge medications will be authorized once you are discharged, as it is imperative that you return to your primary care physician (or establish a relationship with a primary care physician if you do not have one) for your aftercare needs so that they can reassess your need for medications and monitor your lab values.   Discharge wound care:   Complete by: As directed    1. Apply foam dressing to upper middle back and change Q 3 days or PRN soiling. 2. Apply Aquacel Kellie Simmering # [phone]) to lower middle back Q day, then cover with foam dressing. (Change foam dressing Q 3 days or PRN soiling.)   Increase activity slowly   Complete by: As directed       Allergies as of 05/10/2022   No Known Allergies      Medication List     STOP taking these medications    CertaVite/Antioxidants Tabs   cyanocobalamin 1000 MCG tablet Commonly known as: VITAMIN B12   Gerhardt's butt cream Crea   magnesium oxide 400 MG tablet Commonly known as: MAG-OX       TAKE these medications    ascorbic acid 500 MG tablet Commonly known as: VITAMIN C Take 1 tablet (500 mg total) by mouth 2 (two) times daily.   ferrous sulfate 325 (65 FE) MG tablet Take 1 tablet (325 mg total) by mouth 2 (two) times daily with a meal.   folic acid 1 MG tablet Commonly known as: FOLVITE Take 1 tablet (1 mg total) by mouth daily.   thiamine 100 MG tablet Commonly known as: VITAMIN B1 Take 1 tablet (100 mg total) by mouth daily. Start taking on: May 11, 2022   Zinc Oxide 12.8 % ointment Commonly known as: TRIPLE PASTE Apply 1 application topically 2 (two) times  daily as needed.               Discharge Care Instructions  (From admission, onward)           Start     Ordered   05/10/22 0000  Discharge wound care:       Comments: 1. Apply foam dressing to upper middle back and change Q 3 days or PRN soiling. 2. Apply Aquacel Kellie Simmering # [id number]) to lower middle back Q day, then cover with foam dressing. (Change foam dressing Q 3 days or PRN soiling.)   05/10/22 1622            No Known Allergies    Procedures/Studies: CT CHEST ABDOMEN PELVIS WO CONTRAST  Result Date: 04/25/2022 CLINICAL DATA:  Polytrauma, blunt.  Pt was found by his neighbor laying in his floor covered in feces and empty beer cans beside him. Pt states he slipped and fell a couple a couple days ago and was unable to get off the floor" EXAM: CT CHEST, ABDOMEN AND PELVIS WITHOUT CONTRAST TECHNIQUE: Multidetector CT imaging of the chest, abdomen and pelvis was performed following the standard protocol without IV contrast. RADIATION DOSE REDUCTION: This exam was performed according to the departmental dose-optimization program which includes automated exposure control, adjustment of the mA and/or kV according to patient size and/or use of iterative reconstruction technique. COMPARISON:  None Available. FINDINGS: CHEST: Cardiovascular: The thoracic aorta is normal in caliber. The heart is normal in size. No significant pericardial effusion. Mild atherosclerotic plaque. At least 2 vessel coronary calcification. Lungs/Pleura: Several vague scattered left upper lobe ground-glass airspace opacities. No pulmonary nodule. No pulmonary mass. No pulmonary contusion or laceration. No pneumatocele formation. No pleural effusion. No pneumothorax. No hemothorax. Mediastinum/Nodes: No pneumomediastinum. The central airways are patent. The esophagus is unremarkable. The thyroid is unremarkable. Limited evaluation for hilar lymphadenopathy on this noncontrast study. No mediastinal or axillary  lymphadenopathy. Musculoskeletal/Chest wall No chest wall mass. No acute rib or sternal fracture. Likely old healed left clavicular fracture. Old healed left rib fractures. No spinal fracture. ABDOMEN / PELVIS: Hepatobiliary: Not enlarged. No focal lesion. The hepatic parenchyma is diffusely hypodense compared to the splenic parenchyma consistent with fatty infiltration. Tiny gallstones within the gallbladder lumen. No gallbladder wall thickening or pericholecystic fluid. No biliary ductal dilatation. Pancreas: Normal pancreatic contour. No main pancreatic duct dilatation. Spleen: Not enlarged. No focal lesion. Adrenals/Urinary Tract: No nodularity bilaterally. No hydroureteronephrosis. No nephroureterolithiasis. No contour deforming renal mass. The urinary bladder is unremarkable. Stomach/Bowel: No small or large bowel wall thickening or dilatation. Fatty infiltration of the small and large bowel wall suggestive of chronic inflammatory changes. Colonic diverticulosis. The appendix is unremarkable. Vasculature/Lymphatic: No abdominal aorta or iliac aneurysm. No abdominal, pelvic, inguinal lymphadenopathy. Reproductive: Normal. Other: No simple free fluid ascites. No pneumoperitoneum. No mesenteric hematoma identified. No organized fluid collection. Musculoskeletal: No significant soft tissue hematoma. Tiny fat containing umbilical hernia. No acute pelvic fracture. No spinal fracture. Chronic L1 compression fracture. Mild retrolisthesis of L5 on S1. Intervertebral disc space vacuum phenomenon at the L5-S1 level. Ports and Devices: None. IMPRESSION: 1. Several vague scattered left upper lobe ground-glass airspace opacities that could represent developing infection/inflammation. 2. No acute traumatic injury to the chest, abdomen, or pelvis with limited evaluation on this noncontrast study. 3. No acute fracture or traumatic malalignment of the thoracic or lumbar spine in a patient with a chronic L1 compression fracture.  4. Other imaging findings of potential clinical significance: Hepatic steatosis. Cholelithiasis with no acute cholecystitis. Colonic diverticulosis with no acute diverticulitis. Left rib fractures. Electronically Signed   By: Iven Finn M.D.   On: 04/25/2022 17:09   CT Head Wo Contrast  Result Date: 04/25/2022 CLINICAL DATA:  Mental status change, unknown cause; Neck trauma, dangerous injury mechanism (Age [age]y). Pt was found by his neighbor laying in his floor covered in feces and empty beer cans beside him. Pt states he slipped and fell a couple a couple days ago and was unable to get off the floor" EXAM: CT HEAD WITHOUT CONTRAST CT CERVICAL SPINE WITHOUT CONTRAST TECHNIQUE: Multidetector CT imaging of the head and cervical spine was performed following the standard protocol without intravenous contrast. Multiplanar CT image reconstructions of the cervical spine were also generated. RADIATION DOSE REDUCTION: This  exam was performed according to the departmental dose-optimization program which includes automated exposure control, adjustment of the mA and/or kV according to patient size and/or use of iterative reconstruction technique. COMPARISON:  Ct head and cervical spine 09/20/2021 FINDINGS: CT HEAD FINDINGS BRAIN: BRAIN Stable prominence of the lateral ventricles may be related to central predominant atrophy, although a component of normal pressure/communicating hydrocephalus cannot be excluded. No evidence of large-territorial acute infarction. No parenchymal hemorrhage. No mass lesion. No extra-axial collection. No mass effect or midline shift. No hydrocephalus. Basilar cisterns are patent. Vascular: No hyperdense vessel. Skull: No acute fracture or focal lesion. Sinuses/Orbits: Paranasal sinuses and mastoid air cells are clear. The orbits are unremarkable. Other: None. CT CERVICAL SPINE FINDINGS Alignment: Reversal of normal cervical lordosis likely due to positioning. Mild retrolisthesis C3 on C4  likely due to degenerative changes. Skull base and vertebrae: Multilevel mild-to-moderate degenerative changes of the spine. No acute fracture. No aggressive appearing focal osseous lesion or focal pathologic process. Soft tissues and spinal canal: No prevertebral fluid or swelling. No visible canal hematoma. Upper chest: Trace biapical pleural/pulmonary scarring. Other: None. IMPRESSION: 1. No acute intracranial abnormality. 2. No acute displaced fracture or traumatic listhesis of the cervical spine. Electronically Signed   By: Iven Finn M.D.   On: 04/25/2022 17:00   CT Cervical Spine Wo Contrast  Result Date: 04/25/2022 CLINICAL DATA:  Mental status change, unknown cause; Neck trauma, dangerous injury mechanism (Age 33-64y). Pt was found by his neighbor laying in his floor covered in feces and empty beer cans beside him. Pt states he slipped and fell a couple a couple days ago and was unable to get off the floor" EXAM: CT HEAD WITHOUT CONTRAST CT CERVICAL SPINE WITHOUT CONTRAST TECHNIQUE: Multidetector CT imaging of the head and cervical spine was performed following the standard protocol without intravenous contrast. Multiplanar CT image reconstructions of the cervical spine were also generated. RADIATION DOSE REDUCTION: This exam was performed according to the departmental dose-optimization program which includes automated exposure control, adjustment of the mA and/or kV according to patient size and/or use of iterative reconstruction technique. COMPARISON:  Ct head and cervical spine 09/20/2021 FINDINGS: CT HEAD FINDINGS BRAIN: BRAIN Stable prominence of the lateral ventricles may be related to central predominant atrophy, although a component of normal pressure/communicating hydrocephalus cannot be excluded. No evidence of large-territorial acute infarction. No parenchymal hemorrhage. No mass lesion. No extra-axial collection. No mass effect or midline shift. No hydrocephalus. Basilar cisterns are  patent. Vascular: No hyperdense vessel. Skull: No acute fracture or focal lesion. Sinuses/Orbits: Paranasal sinuses and mastoid air cells are clear. The orbits are unremarkable. Other: None. CT CERVICAL SPINE FINDINGS Alignment: Reversal of normal cervical lordosis likely due to positioning. Mild retrolisthesis C3 on C4 likely due to degenerative changes. Skull base and vertebrae: Multilevel mild-to-moderate degenerative changes of the spine. No acute fracture. No aggressive appearing focal osseous lesion or focal pathologic process. Soft tissues and spinal canal: No prevertebral fluid or swelling. No visible canal hematoma. Upper chest: Trace biapical pleural/pulmonary scarring. Other: None. IMPRESSION: 1. No acute intracranial abnormality. 2. No acute displaced fracture or traumatic listhesis of the cervical spine. Electronically Signed   By: Iven Finn M.D.   On: 04/25/2022 17:00   DG Chest Port 1 View  Result Date: 04/25/2022 CLINICAL DATA:  Questionable sepsis - evaluate for abnormality EXAM: PORTABLE CHEST 1 VIEW COMPARISON:  Radiograph 10/22/2021, CT 10/22/2021 FINDINGS: Unchanged cardiomediastinal silhouette. There is no focal airspace consolidation. There is no pleural  effusion. No pneumothorax. There is no acute osseous abnormality. Chronic clavicle and rib fracture deformities. IMPRESSION: No evidence of acute cardiopulmonary disease. Electronically Signed   By: Maurine Simmering M.D.   On: 04/25/2022 16:31      Discharge Exam: Vitals:   05/10/22 1157 05/10/22 1612  BP: (!) 88/68 114/80  Pulse: 82 91  Resp: 20   Temp: 97.7 F (36.5 C) 98.4 F (36.9 C)  SpO2: 99% 99%   General exam: Appears calm and comfortable  Respiratory system: Clear to auscultation. Respiratory effort normal. No respiratory distress. No conversational dyspnea.  Cardiovascular system: S1 & S2 heard, RRR. No murmurs. No pedal edema. Gastrointestinal system: Abdomen is nondistended, soft and nontender. Normal bowel  sounds heard. Central nervous system: Alert  Extremities: Symmetric in appearance  Skin: No rashes, lesions or ulcers on exposed skin  Psychiatry: Mood & affect appropriate.       The results of significant diagnostics from this hospitalization (including imaging, microbiology, ancillary and laboratory) are listed below for reference.     Microbiology: No results found for this or any previous visit (from the past 240 hour(s)).   Labs: BNP (last 3 results) Recent Labs    08/25/21 1805  BNP 0000000   Basic Metabolic Panel: Recent Labs  Lab 05/06/22 0412 05/07/22 0420 05/08/22 0317 05/09/22 0813 05/10/22 0357  NA 131* 133* 132* 135 137  K 3.9 4.5 3.8 3.9 4.4  CL 103 108 107 107 108  CO2 21* 19* 20* 21* 23  GLUCOSE 87 97 96 86 105*  BUN 8 6 8 6 8   CREATININE 0.44* 0.42* 0.49* 0.51* 0.45*  CALCIUM 8.4* 8.7* 8.3* 8.5* 8.2*  MG 1.5* 1.8 1.5* 1.5* 2.0  PHOS 4.3 3.7 3.8 4.0 3.7   Liver Function Tests: Recent Labs  Lab 05/06/22 0412 05/07/22 0420 05/08/22 0317 05/09/22 0813 05/10/22 0357  AST 47* 54* 56* 50* 57*  ALT 30 31 31  33 30  ALKPHOS 170* 190* 191* 191* 198*  BILITOT 0.8 1.0 0.8 0.8 0.9  PROT 6.6 7.2 6.8 6.9 6.7  ALBUMIN 2.2* 2.4* 2.3* 2.4* 2.3*   No results for input(s): "LIPASE", "AMYLASE" in the last 168 hours. Recent Labs  Lab 05/06/22 0412 05/07/22 0420 05/08/22 0317 05/09/22 0813 05/10/22 0357  AMMONIA 33 27 25 20 29    CBC: Recent Labs  Lab 05/06/22 0412 05/07/22 0420 05/08/22 0317 05/09/22 0813 05/10/22 0357  WBC 7.6 6.7 6.8 5.7 6.8  NEUTROABS 4.9 4.1 3.6 3.0 3.5  HGB 8.8* 9.6* 8.9* 9.7* 9.5*  HCT 26.4* 30.2* 28.3* 30.4* 30.1*  MCV 100.4* 105.6* 105.2* 104.1* 106.0*  PLT 400 346 361 409* 337   Cardiac Enzymes: No results for input(s): "CKTOTAL", "CKMB", "CKMBINDEX", "TROPONINI" in the last 168 hours. BNP: Invalid input(s): "POCBNP" CBG: No results for input(s): "GLUCAP" in the last 168 hours. D-Dimer No results for input(s):  "DDIMER" in the last 72 hours. Hgb A1c No results for input(s): "HGBA1C" in the last 72 hours. Lipid Profile No results for input(s): "CHOL", "HDL", "LDLCALC", "TRIG", "CHOLHDL", "LDLDIRECT" in the last 72 hours. Thyroid function studies No results for input(s): "TSH", "T4TOTAL", "T3FREE", "THYROIDAB" in the last 72 hours.  Invalid input(s): "FREET3" Anemia work up No results for input(s): "VITAMINB12", "FOLATE", "FERRITIN", "TIBC", "IRON", "RETICCTPCT" in the last 72 hours. Urinalysis    Component Value Date/Time   COLORURINE AMBER (A) 04/26/2022 [place] 04/26/2022 0052   LABSPEC 1.024 04/26/2022 0052   PHURINE 5.0 04/26/2022 0052  GLUCOSEU NEGATIVE 04/26/2022 0052   HGBUR NEGATIVE 04/26/2022 0052   BILIRUBINUR MODERATE (A) 04/26/2022 0052   KETONESUR 20 (A) 04/26/2022 0052   PROTEINUR 30 (A) 04/26/2022 0052   NITRITE NEGATIVE 04/26/2022 0052   LEUKOCYTESUR NEGATIVE 04/26/2022 0052   Sepsis Labs Recent Labs  Lab 05/07/22 0420 05/08/22 0317 05/09/22 0813 05/10/22 0357  WBC 6.7 6.8 5.7 6.8   Microbiology No results found for this or any previous visit (from the past 240 hour(s)).   Patient was seen and examined on the day of discharge and was found to be in stable condition. Time coordinating discharge: 35 minutes including assessment and coordination of care, as well as examination of the patient.   SIGNED:  Noralee Stain, DO [facility] 05/10/2022, 4:22 PM

## 2022-05-10 NOTE — Progress Notes (Signed)
Patients belongings including cell phone, charger and wallet delivered to room after being dropped off by patients family member.

## 2022-05-10 NOTE — Progress Notes (Signed)
Nutrition Follow-up  DOCUMENTATION CODES:   Not applicable  INTERVENTION:  - continue Ensure TID.   NUTRITION DIAGNOSIS:   Inadequate oral intake related to acute illness as evidenced by per patient/family report. -resolving.  GOAL:   Patient will meet greater than or equal to 90% of their needs -met  MONITOR:   PO intake, Supplement acceptance, Labs, Weight trends, Skin, I & O's  ASSESSMENT:   56 y/o male with h/o etoh abuse, cirrhosis, hepatitis and compression fracture who is admitted with E-coli diarrhea, rib fractures, encephalopathy and aspiration PNA.  Pending discharge plan to either SNF or home. He has been eating 100% at all meals since breakfast on 8/7 and accepting Ensure 75-90% of the time offered since RD assessment on 8/2.  He has not been weighed since 8/2. Non-pitting edema to BLE documented in the edema section of flow sheet yesterday AM.    Labs reviewed; creatinine: 0.45 mg/dl, Ca: 8.2 mg/dl, Alk Phos elevated and trending up.   Medications reviewed; 500 mg ascorbic acid BID, 325 mg ferrous sulfate BID, 1 mg folvite/day, 4 g IV Mg sulfate x1 run 8/8, 1 tablet multivitamin with minerals/day, 100 mg oral thiamine/day.  IVF; NS @ 75 ml/hr.    Diet Order:   Diet Order             Diet regular Room service appropriate? Yes; Fluid consistency: Thin  Diet effective now                   EDUCATION NEEDS:   No education needs have been identified at this time  Skin:  Skin Assessment: Skin Integrity Issues: Skin Integrity Issues:: Other (Comment), Stage II, DTI DTI: L shoulder Stage II: mid-back Other: non-pressure injury to low back  Last BM:  8/8 (type 6 x1, medium amount)  Height:   Ht Readings from Last 1 Encounters:  04/25/22 6' (1.829 m)    Weight:   Wt Readings from Last 1 Encounters:  05/03/22 76.1 kg     BMI:  Body mass index is 22.74 kg/m.  Estimated Nutritional Needs:  Kcal:  2200-2500kcal/day Protein:   110-125g/day Fluid:  2.2-2.5L/day     Jarome Matin, MS, RD, LDN, CNSC Registered Dietitian II Inpatient Clinical Nutrition RD pager # and on-call/weekend pager # available in [place]

## 2022-05-10 NOTE — Progress Notes (Signed)
PROGRESS NOTE    Dylan Weaver  MPN:[id number] DOB: Aug 09, 1966 DOA: 04/25/2022 PCP: [name]     Brief Narrative:  Dylan Weaver is a 56 y.o.male with PMHx EtOH abuse, Hx EtOH Hepatitis, prolonged QT interval, pressure ulcer.    Brought into the ED by EMS after neighbor found the patient lying on the floor covered in feces and surrounded by empty beer cans.  Patient apparently reported to EMS that he fell 2 days prior to presentation. He was confused and history is very limited.  He had diarrhea and GI panel came back positive for enteropathogenic E. coli and resolved without treatment. He had hypovolemic hyponatremia and dehydration that resolved with IV fluids.  His encephalopathy has significantly improved.  He remains weak and deconditioned PT OT recommend skilled nursing facility placement   New events last 24 hours / Subjective: Patient states that he was diagnosed for Crohn's years ago, and states that he had some incontinent stools attributed to that.  I did not see any mention of Crohn's anywhere in his chart.  He has no other physical complaints today.  Has been tolerating diet without any issues.  Assessment & Plan:   Principal Problem:   Hyponatremia Active Problems:   Alcohol use disorder with alcohol withdrawal    Thrombocytopenia (HCC)   Normochromic normocytic anemia   Elevated LFTs   Prolonged QT interval   Pressure ulcer   Acute metabolic encephalopathy   Hypokalemia   Aspiration pneumonia (HCC)   Enteropathogenic Escherichia coli infection   Hypophosphatemia   Enteropathogenic E. coli diarrhea -Improved  Hypovolemic hyponatremia -Resolved. Stop IVF   Aspiration pneumonia -Completed antibiotic course  Transaminitis  -Likely alcohol related.  Acute hepatitis panel was negative -Improved  Thrombocytopenia -Resolved  QT prolongation -Resolved   Deconditioning/debility -SNF placement is pending   In agreement  with assessment of the pressure ulcer as below:  Pressure Injury 04/26/22 Back Mid Stage 2 -  Partial thickness loss of dermis presenting as a shallow open injury with a red, pink wound bed without slough. (Active)  04/26/22 1517  Location: Back  Location Orientation: Mid  Staging: Stage 2 -  Partial thickness loss of dermis presenting as a shallow open injury with a red, pink wound bed without slough.  Wound Description (Comments):   Present on Admission: Yes  Dressing Type Foam - Lift dressing to assess site every shift 05/09/22 2020     Nutrition Problem: Inadequate oral intake Etiology: acute illness   DVT prophylaxis:  heparin injection 5,000 Units Start: 05/03/22 2200 SCDs Start: 04/25/22 1729  Code Status: Full Family Communication: None at bedside Disposition Plan:  Status is: Inpatient Remains inpatient appropriate because: disposition pending. Medically stable to discharge once SNF placement found.    Antimicrobials:  Anti-infectives (From admission, onward)    Start     Dose/Rate Route Frequency Ordered Stop   04/28/22 1400  amoxicillin-clavulanate (AUGMENTIN) 875-125 MG per tablet 1 tablet        1 tablet Oral Every 12 hours 04/28/22 1139 04/30/22 2139   04/25/22 1800  Ampicillin-Sulbactam (UNASYN) 3 g in sodium chloride 0.9 % 100 mL IVPB  Status:  Discontinued        3 g 200 mL/hr over 30 Minutes Intravenous Every 6 hours 04/25/22 1748 04/28/22 1139        Objective: Vitals:   05/09/22 0420 05/09/22 1423 05/09/22 2310 05/10/22 1157  BP: 96/64 110/72 102/77 (!) 88/68  Pulse: 69 73 88 82  Resp: 18 20 20 20   Temp: 98.8 F (37.1 C) 97.8 F (36.6 C) (!) 97.4 F (36.3 C) 97.7 F (36.5 C)  TempSrc: Oral Oral Oral Oral  SpO2: 97% 99% 99% 99%  Weight:      Height:        Intake/Output Summary (Last 24 hours) at 05/10/2022 1349 Last data filed at 05/10/2022 1311 Gross per 24 hour  Intake 2632.95 ml  Output 2000 ml  Net 632.95 ml   Filed Weights    04/25/22 1403 05/03/22 1200  Weight: 83.9 kg 76.1 kg    Examination:  General exam: Appears calm and comfortable  Respiratory system: Clear to auscultation. Respiratory effort normal. No respiratory distress. No conversational dyspnea.  Cardiovascular system: S1 & S2 heard, RRR. No murmurs. No pedal edema. Gastrointestinal system: Abdomen is nondistended, soft and nontender. Normal bowel sounds heard. Central nervous system: Alert  Extremities: Symmetric in appearance  Skin: No rashes, lesions or ulcers on exposed skin  Psychiatry: Mood & affect appropriate.   Data Reviewed: I have personally reviewed following labs and imaging studies  CBC: Recent Labs  Lab 05/06/22 0412 05/07/22 0420 05/08/22 0317 05/09/22 0813 05/10/22 0357  WBC 7.6 6.7 6.8 5.7 6.8  NEUTROABS 4.9 4.1 3.6 3.0 3.5  HGB 8.8* 9.6* 8.9* 9.7* 9.5*  HCT 26.4* 30.2* 28.3* 30.4* 30.1*  MCV 100.4* 105.6* 105.2* 104.1* 106.0*  PLT 400 346 361 409* [id number]   Basic Metabolic Panel: Recent Labs  Lab 05/06/22 0412 05/07/22 0420 05/08/22 0317 05/09/22 0813 05/10/22 0357  NA 131* 133* 132* 135 137  K 3.9 4.5 3.8 3.9 4.4  CL 103 108 107 107 108  CO2 21* 19* 20* 21* 23  GLUCOSE 87 97 96 86 105*  BUN 8 6 8 6 8   CREATININE 0.44* 0.42* 0.49* 0.51* 0.45*  CALCIUM 8.4* 8.7* 8.3* 8.5* 8.2*  MG 1.5* 1.8 1.5* 1.5* 2.0  PHOS 4.3 3.7 3.8 4.0 3.7   GFR: Estimated Creatinine Clearance: 112.3 mL/min (A) (by C-G formula based on SCr of 0.45 mg/dL (L)). Liver Function Tests: Recent Labs  Lab 05/06/22 0412 05/07/22 0420 05/08/22 0317 05/09/22 0813 05/10/22 0357  AST 47* 54* 56* 50* 57*  ALT 30 31 31  33 30  ALKPHOS 170* 190* 191* 191* 198*  BILITOT 0.8 1.0 0.8 0.8 0.9  PROT 6.6 7.2 6.8 6.9 6.7  ALBUMIN 2.2* 2.4* 2.3* 2.4* 2.3*   No results for input(s): "LIPASE", "AMYLASE" in the last 168 hours. Recent Labs  Lab 05/06/22 0412 05/07/22 0420 05/08/22 0317 05/09/22 0813 05/10/22 0357  AMMONIA 33 27 25 20 29     Coagulation Profile: No results for input(s): "INR", "PROTIME" in the last 168 hours. Cardiac Enzymes: No results for input(s): "CKTOTAL", "CKMB", "CKMBINDEX", "TROPONINI" in the last 168 hours. BNP (last 3 results) No results for input(s): "PROBNP" in the last 8760 hours. HbA1C: No results for input(s): "HGBA1C" in the last 72 hours. CBG: No results for input(s): "GLUCAP" in the last 168 hours. Lipid Profile: No results for input(s): "CHOL", "HDL", "LDLCALC", "TRIG", "CHOLHDL", "LDLDIRECT" in the last 72 hours. Thyroid Function Tests: No results for input(s): "TSH", "T4TOTAL", "FREET4", "T3FREE", "THYROIDAB" in the last 72 hours. Anemia Panel: No results for input(s): "VITAMINB12", "FOLATE", "FERRITIN", "TIBC", "IRON", "RETICCTPCT" in the last 72 hours. Sepsis Labs: No results for input(s): "PROCALCITON", "LATICACIDVEN" in the last 168 hours.  No results found for this or any previous visit (from the past 240 hour(s)).    Radiology Studies: No results found.  Scheduled Meds:  vitamin C  500 mg Oral BID   feeding supplement  237 mL Oral TID BM   ferrous sulfate  325 mg Oral BID WC   folic acid  1 mg Oral Daily   heparin injection (subcutaneous)  5,000 Units Subcutaneous Q8H   multivitamin with minerals  1 tablet Oral Daily   thiamine  100 mg Oral Daily   Continuous Infusions:  sodium chloride 75 mL/hr at 05/10/22 1222     LOS: 15 days     Noralee Stain, DO [facility] 05/10/2022, 1:49 PM   Available via Epic secure chat 7am-7pm After these hours, please refer to coverage provider listed on [email or web address]

## 2022-05-11 NOTE — Progress Notes (Signed)
Occupational Therapy Treatment Patient Details Name: Dylan Weaver MRN: [id number] DOB: Aug 05, 1966 Today's Date: 05/11/2022   History of present illness 56 y.o.m w/ history of alcohol abuse, previous history of alcoholic hepatitis brought into the ED by EMS after neighbor found the patient lying on the floor covered in feces and surrounded by empty beer cans.  Patient apparently reported to EMS that he fell 2 days prior to presentation. He was confused and history is very limited.   OT comments  Treatment focused on safe functional mobility and ADLs. Patient able to perform sponge bath seated on toilet with set up and able to stand at sink with min guard. Requires use of walker as he is unsteady. Still needs supervision and min guard for safety due to high fall risk. Cont POC.    Recommendations for follow up therapy are one component of a multi-disciplinary discharge planning process, led by the attending physician.  Recommendations may be updated based on patient status, additional functional criteria and insurance authorization.    Follow Up Recommendations  Skilled nursing-short term rehab (<3 hours/day)    Assistance Recommended at Discharge Frequent or constant Supervision/Assistance  Patient can return home with the following  A little help with walking and/or transfers;Assistance with cooking/housework;A little help with bathing/dressing/bathroom   Equipment Recommendations  Other (comment) (TBD)    Recommendations for Other Services      Precautions / Restrictions Precautions Precautions: Fall Precaution Comments: Impulsive, fell 8/9 getting up by himself Restrictions Weight Bearing Restrictions: No       Mobility Bed Mobility                    Transfers                         Balance Overall balance assessment: Mild deficits observed, not formally tested                                         ADL either performed or  assessed with clinical judgement   ADL Overall ADL's : Needs assistance/impaired Eating/Feeding: Independent   Grooming: Oral care;Standing;Min guard Grooming Details (indicate cue type and reason): perfomred oral care standing at sink with min guard Upper Body Bathing: Set up;Sitting Upper Body Bathing Details (indicate cue type and reason): sitting on toilet Lower Body Bathing: Set up;Supervison/ safety;Sitting/lateral leans Lower Body Bathing Details (indicate cue type and reason): sitting on toilet to wash up. Can leave over and reach his feet. Upper Body Dressing : Set up;Sitting   Lower Body Dressing: Set up;Sitting/lateral leans Lower Body Dressing Details (indicate cue type and reason): able to sit and don socks Toilet Transfer: Min guard;Rolling walker (2 wheels);Regular Toilet;Grab bars           Functional mobility during ADLs: Min guard;Rolling walker (2 wheels) General ADL Comments: Min guard to ambulate in room with RW and to stand at sink for grooming. Supervision and set upf or ADLs.    Extremity/Trunk Assessment Upper Extremity Assessment Upper Extremity Assessment: Overall WFL for tasks assessed   Lower Extremity Assessment Lower Extremity Assessment: Defer to PT evaluation   Cervical / Trunk Assessment Cervical / Trunk Assessment: Normal    Vision Patient Visual Report: No change from baseline     Perception     Praxis      Cognition Arousal/Alertness: Awake/alert Behavior  During Therapy: WFL for tasks assessed/performed Overall Cognitive Status: Within Functional Limits for tasks assessed                                 General Comments: Still has some deficits into insights and safety and memory. But able to follow commands. This may be his new baseline.        Exercises      Shoulder Instructions       General Comments      Pertinent Vitals/ Pain       Pain Assessment Pain Assessment: No/denies pain  Home Living                                           Prior Functioning/Environment              Frequency  Min 2X/week        Progress Toward Goals  OT Goals(current goals can now be found in the care plan section)  Progress towards OT goals: Progressing toward goals  Acute Rehab OT Goals Patient Stated Goal: walk by himself OT Goal Formulation: With patient Time For Goal Achievement: 05/26/22 Potential to Achieve Goals: Fair  Plan Discharge plan remains appropriate    Co-evaluation                 AM-PAC OT "6 Clicks" Daily Activity     Outcome Measure   Help from another person eating meals?: A Little Help from another person taking care of personal grooming?: A Little Help from another person toileting, which includes using toliet, bedpan, or urinal?: A Little Help from another person bathing (including washing, rinsing, drying)?: A Little Help from another person to put on and taking off regular upper body clothing?: A Little Help from another person to put on and taking off regular lower body clothing?: A Little 6 Click Score: 18    End of Session Equipment Utilized During Treatment: Rolling walker (2 wheels);Gait belt  OT Visit Diagnosis: Unsteadiness on feet (R26.81);History of falling (Z91.81);Other symptoms and signs involving cognitive function   Activity Tolerance Patient tolerated treatment well   Patient Left with call bell/phone within reach;with nursing/sitter in room;in chair;with chair alarm set   Nurse Communication Mobility status        Time: 2725-3664 OT Time Calculation (min): 17 min  Charges: OT General Charges $OT Visit: 1 Visit OT Treatments $Self Care/Home Management : 8-22 mins  Waldron Session, OTR/L Acute Care Rehab Services  Office [phone] Pager: [phone]   Kelli Churn 05/11/2022, 10:23 AM

## 2022-05-11 NOTE — Progress Notes (Addendum)
  PROGRESS NOTE  Patient was supposed to discharge home yesterday, although the discharge was delayed due to patient falling in the hospital room.  There was question for safety and returning home.  I spoke with aunt over the phone. Patient lives alone at home and does not have 24/7 supervision.  Aunt checks in on patient intermittently.  Unfortunately, there were no SNF bed options/offers.  I also discussed with TOC as well as difficult to place [facility] team.  They have recommended that patient return home with home health care.  Aunt is aware of patient's debility and high risk of readmission with falls.   Noralee Stain, DO [facility] 05/11/2022, 12:16 PM  Available via Epic secure chat 7am-7pm After these hours, please refer to coverage provider listed on [email or web address]

## 2022-05-11 NOTE — Progress Notes (Signed)
Patient did not d/c home yesterday due to medical. TOC will continue to follow.

## 2022-07-27 ENCOUNTER — Emergency Department (HOSPITAL_COMMUNITY): Payer: No Typology Code available for payment source

## 2022-07-27 ENCOUNTER — Encounter (HOSPITAL_COMMUNITY): Payer: Self-pay

## 2022-07-27 ENCOUNTER — Inpatient Hospital Stay (HOSPITAL_COMMUNITY)
Admission: EM | Admit: 2022-07-27 | Discharge: 2022-08-09 | DRG: 897 | Disposition: A | Discharge status: Skilled Nursing Facility | Payer: No Typology Code available for payment source | Attending: Internal Medicine | Admitting: Internal Medicine

## 2022-07-27 ENCOUNTER — Other Ambulatory Visit: Payer: Self-pay

## 2022-07-27 DIAGNOSIS — F05 Delirium due to known physiological condition: Secondary | ICD-10-CM | POA: Diagnosis present

## 2022-07-27 DIAGNOSIS — E440 Moderate protein-calorie malnutrition: Secondary | ICD-10-CM | POA: Diagnosis present

## 2022-07-27 DIAGNOSIS — R7401 Elevation of levels of liver transaminase levels: Secondary | ICD-10-CM | POA: Diagnosis present

## 2022-07-27 DIAGNOSIS — L24A2 Irritant contact dermatitis due to fecal, urinary or dual incontinence: Secondary | ICD-10-CM | POA: Diagnosis present

## 2022-07-27 DIAGNOSIS — F10931 Alcohol use, unspecified with withdrawal delirium: Secondary | ICD-10-CM | POA: Diagnosis not present

## 2022-07-27 DIAGNOSIS — E869 Volume depletion, unspecified: Secondary | ICD-10-CM | POA: Diagnosis present

## 2022-07-27 DIAGNOSIS — F10231 Alcohol dependence with withdrawal delirium: Principal | ICD-10-CM | POA: Diagnosis present

## 2022-07-27 DIAGNOSIS — F17210 Nicotine dependence, cigarettes, uncomplicated: Secondary | ICD-10-CM | POA: Diagnosis present

## 2022-07-27 DIAGNOSIS — F1020 Alcohol dependence, uncomplicated: Secondary | ICD-10-CM | POA: Diagnosis not present

## 2022-07-27 DIAGNOSIS — L8996 Pressure-induced deep tissue damage of unspecified site: Secondary | ICD-10-CM | POA: Diagnosis not present

## 2022-07-27 DIAGNOSIS — Y92009 Unspecified place in unspecified non-institutional (private) residence as the place of occurrence of the external cause: Secondary | ICD-10-CM

## 2022-07-27 DIAGNOSIS — D638 Anemia in other chronic diseases classified elsewhere: Secondary | ICD-10-CM | POA: Diagnosis present

## 2022-07-27 DIAGNOSIS — E876 Hypokalemia: Secondary | ICD-10-CM

## 2022-07-27 DIAGNOSIS — Z6824 Body mass index (BMI) 24.0-24.9, adult: Secondary | ICD-10-CM

## 2022-07-27 DIAGNOSIS — Z8249 Family history of ischemic heart disease and other diseases of the circulatory system: Secondary | ICD-10-CM | POA: Diagnosis not present

## 2022-07-27 DIAGNOSIS — W1830XA Fall on same level, unspecified, initial encounter: Secondary | ICD-10-CM | POA: Diagnosis present

## 2022-07-27 DIAGNOSIS — R9431 Abnormal electrocardiogram [ECG] [EKG]: Secondary | ICD-10-CM | POA: Diagnosis present

## 2022-07-27 DIAGNOSIS — F0392 Unspecified dementia, unspecified severity, with psychotic disturbance: Secondary | ICD-10-CM | POA: Diagnosis present

## 2022-07-27 DIAGNOSIS — I10 Essential (primary) hypertension: Secondary | ICD-10-CM | POA: Diagnosis present

## 2022-07-27 DIAGNOSIS — L8990 Pressure ulcer of unspecified site, unspecified stage: Secondary | ICD-10-CM | POA: Insufficient documentation

## 2022-07-27 DIAGNOSIS — L89102 Pressure ulcer of unspecified part of back, stage 2: Secondary | ICD-10-CM | POA: Diagnosis present

## 2022-07-27 DIAGNOSIS — F10932 Alcohol use, unspecified with withdrawal with perceptual disturbance: Secondary | ICD-10-CM | POA: Diagnosis not present

## 2022-07-27 DIAGNOSIS — L03317 Cellulitis of buttock: Secondary | ICD-10-CM

## 2022-07-27 DIAGNOSIS — Z79899 Other long term (current) drug therapy: Secondary | ICD-10-CM

## 2022-07-27 DIAGNOSIS — R600 Localized edema: Secondary | ICD-10-CM | POA: Diagnosis present

## 2022-07-27 DIAGNOSIS — L0390 Cellulitis, unspecified: Secondary | ICD-10-CM | POA: Diagnosis present

## 2022-07-27 DIAGNOSIS — F10232 Alcohol dependence with withdrawal with perceptual disturbance: Secondary | ICD-10-CM | POA: Diagnosis present

## 2022-07-27 DIAGNOSIS — R52 Pain, unspecified: Secondary | ICD-10-CM | POA: Diagnosis present

## 2022-07-27 DIAGNOSIS — E871 Hypo-osmolality and hyponatremia: Secondary | ICD-10-CM | POA: Diagnosis present

## 2022-07-27 DIAGNOSIS — M7989 Other specified soft tissue disorders: Secondary | ICD-10-CM | POA: Diagnosis not present

## 2022-07-27 LAB — COMPREHENSIVE METABOLIC PANEL
ALT: 61 U/L — ABNORMAL HIGH (ref 0–44)
AST: 96 U/L — ABNORMAL HIGH (ref 15–41)
Albumin: 3 g/dL — ABNORMAL LOW (ref 3.5–5.0)
Alkaline Phosphatase: 183 U/L — ABNORMAL HIGH (ref 38–126)
Anion gap: 14 (ref 5–15)
BUN: 8 mg/dL (ref 6–20)
CO2: 27 mmol/L (ref 22–32)
Calcium: 8.8 mg/dL — ABNORMAL LOW (ref 8.9–10.3)
Chloride: 87 mmol/L — ABNORMAL LOW (ref 98–111)
Creatinine, Ser: 0.52 mg/dL — ABNORMAL LOW (ref 0.61–1.24)
GFR, Estimated: >60 mL/min (ref >60)
Glucose, Bld: 84 mg/dL (ref 70–99)
Potassium: 3.1 mmol/L — ABNORMAL LOW (ref 3.5–5.1)
Sodium: 128 mmol/L — ABNORMAL LOW (ref 135–145)
Total Bilirubin: 2.5 mg/dL — ABNORMAL HIGH (ref 0.3–1.2)
Total Protein: 8.1 g/dL (ref 6.5–8.1)

## 2022-07-27 LAB — CBC WITH DIFFERENTIAL/PLATELET
Abs Immature Granulocytes: 0.09 10*3/uL — ABNORMAL HIGH (ref 0.00–0.07)
Basophils Absolute: 0.1 10*3/uL (ref 0.0–0.1)
Basophils Relative: 1 %
Eosinophils Absolute: 0.1 10*3/uL (ref 0.0–0.5)
Eosinophils Relative: 1 %
HCT: 32.4 % — ABNORMAL LOW (ref 39.0–52.0)
Hemoglobin: 11.2 g/dL — ABNORMAL LOW (ref 13.0–17.0)
Immature Granulocytes: 1 %
Lymphocytes Relative: 14 %
Lymphs Abs: 1.5 10*3/uL (ref 0.7–4.0)
MCH: 31.5 pg (ref 26.0–34.0)
MCHC: 34.6 g/dL (ref 30.0–36.0)
MCV: 91.3 fL (ref 80.0–100.0)
Monocytes Absolute: 0.5 10*3/uL (ref 0.1–1.0)
Monocytes Relative: 5 %
Neutro Abs: 8.5 10*3/uL — ABNORMAL HIGH (ref 1.7–7.7)
Neutrophils Relative %: 78 %
Platelets: 249 10*3/uL (ref 150–400)
RBC: 3.55 MIL/uL — ABNORMAL LOW (ref 4.22–5.81)
RDW: 14.2 % (ref 11.5–15.5)
WBC: 10.8 10*3/uL — ABNORMAL HIGH (ref 4.0–10.5)
nRBC: 0.2 % (ref 0.0–0.2)

## 2022-07-27 LAB — PROCALCITONIN: Procalcitonin: 0.51 ng/mL

## 2022-07-27 LAB — SALICYLATE LEVEL: Salicylate Lvl: <7 mg/dL — ABNORMAL LOW (ref 7.0–30.0)

## 2022-07-27 LAB — PROTIME-INR
INR: 1 (ref 0.8–1.2)
Prothrombin Time: 13.3 seconds (ref 11.4–15.2)

## 2022-07-27 LAB — LIPASE, BLOOD: Lipase: 31 U/L (ref 11–51)

## 2022-07-27 LAB — TROPONIN I (HIGH SENSITIVITY)
Troponin I (High Sensitivity): 3 ng/L (ref <18)
Troponin I (High Sensitivity): 3 ng/L (ref <18)

## 2022-07-27 LAB — MAGNESIUM: Magnesium: 1.7 mg/dL (ref 1.7–2.4)

## 2022-07-27 LAB — TSH: TSH: 1.325 u[IU]/mL (ref 0.350–4.500)

## 2022-07-27 LAB — MRSA NEXT GEN BY PCR, NASAL: MRSA by PCR Next Gen: NOT DETECTED

## 2022-07-27 LAB — LACTIC ACID, PLASMA
Lactic Acid, Venous: 2.1 mmol/L (ref 0.5–1.9)
Lactic Acid, Venous: 1.9 mmol/L (ref 0.5–1.9)

## 2022-07-27 LAB — PHOSPHORUS: Phosphorus: 3.2 mg/dL (ref 2.5–4.6)

## 2022-07-27 LAB — ETHANOL: Alcohol, Ethyl (B): <10 mg/dL (ref <10)

## 2022-07-27 LAB — ACETAMINOPHEN LEVEL: Acetaminophen (Tylenol), Serum: <10 ug/mL — ABNORMAL LOW (ref 10–30)

## 2022-07-27 LAB — CK: Total CK: 285 U/L (ref 49–397)

## 2022-07-27 LAB — AMMONIA: Ammonia: 26 umol/L (ref 9–35)

## 2022-07-27 MED ORDER — VANCOMYCIN HCL IN DEXTROSE 1-5 GM/200ML-% IV SOLN
1000.0000 mg | Freq: Once | INTRAVENOUS | Status: AC
  Administered 2022-07-27: 1000 mg via INTRAVENOUS
  Filled 2022-07-27: qty 200

## 2022-07-27 MED ORDER — LORAZEPAM 2 MG/ML IJ SOLN: 0.0000 mg | Freq: Two times a day (BID) | INTRAMUSCULAR | Status: DC

## 2022-07-27 MED ORDER — IOHEXOL 300 MG/ML  SOLN
100.0000 mL | Freq: Once | INTRAMUSCULAR | Status: AC | PRN
  Administered 2022-07-27: 100 mL via INTRAVENOUS

## 2022-07-27 MED ORDER — POTASSIUM CHLORIDE 10 MEQ/100ML IV SOLN
10.0000 meq | INTRAVENOUS | Status: AC
  Administered 2022-07-27 – 2022-07-28 (×4): 10 meq via INTRAVENOUS
  Filled 2022-07-27 (×4): qty 100

## 2022-07-27 MED ORDER — SODIUM CHLORIDE 0.9 % IV SOLN
2.0000 g | Freq: Three times a day (TID) | INTRAVENOUS | Status: DC
  Administered 2022-07-27 – 2022-07-28 (×2): 2 g via INTRAVENOUS
  Filled 2022-07-27 (×2): qty 12.5

## 2022-07-27 MED ORDER — LORAZEPAM 2 MG/ML IJ SOLN
0.0000 mg | Freq: Four times a day (QID) | INTRAMUSCULAR | Status: DC
  Administered 2022-07-27: 2 mg via INTRAVENOUS
  Filled 2022-07-27: qty 1

## 2022-07-27 MED ORDER — LACTATED RINGERS IV BOLUS
1000.0000 mL | Freq: Once | INTRAVENOUS | Status: AC
  Administered 2022-07-27: 1000 mL via INTRAVENOUS

## 2022-07-27 MED ORDER — THIAMINE MONONITRATE 100 MG PO TABS
100.0000 mg | ORAL_TABLET | Freq: Every day | ORAL | Status: DC
  Administered 2022-07-28 – 2022-08-09 (×13): 100 mg via ORAL
  Filled 2022-07-27 (×13): qty 1

## 2022-07-27 MED ORDER — ACETAMINOPHEN 325 MG PO TABS: 650.0000 mg | ORAL_TABLET | Freq: Four times a day (QID) | ORAL | Status: DC | PRN

## 2022-07-27 MED ORDER — POTASSIUM CHLORIDE CRYS ER 20 MEQ PO TBCR
40.0000 meq | EXTENDED_RELEASE_TABLET | Freq: Once | ORAL | Status: AC
  Administered 2022-07-27: 40 meq via ORAL
  Filled 2022-07-27: qty 2

## 2022-07-27 MED ORDER — LORAZEPAM 1 MG PO TABS
1.0000 mg | ORAL_TABLET | ORAL | Status: DC | PRN
  Administered 2022-07-27: 2 mg via ORAL

## 2022-07-27 MED ORDER — HYDROCODONE-ACETAMINOPHEN 5-325 MG PO TABS: 1.0000 | ORAL_TABLET | ORAL | Status: DC | PRN

## 2022-07-27 MED ORDER — FOLIC ACID 1 MG PO TABS
1.0000 mg | ORAL_TABLET | Freq: Every day | ORAL | Status: DC
  Administered 2022-07-27 – 2022-08-09 (×14): 1 mg via ORAL
  Filled 2022-07-27 (×14): qty 1

## 2022-07-27 MED ORDER — ADULT MULTIVITAMIN W/MINERALS CH
1.0000 | ORAL_TABLET | Freq: Every day | ORAL | Status: DC
  Administered 2022-07-27 – 2022-08-09 (×14): 1 via ORAL
  Filled 2022-07-27 (×14): qty 1

## 2022-07-27 MED ORDER — VANCOMYCIN HCL 1500 MG/300ML IV SOLN
1500.0000 mg | Freq: Two times a day (BID) | INTRAVENOUS | Status: DC
  Administered 2022-07-28: 1500 mg via INTRAVENOUS
  Filled 2022-07-27 (×3): qty 300

## 2022-07-27 MED ORDER — ACETAMINOPHEN 650 MG RE SUPP: 650.0000 mg | Freq: Four times a day (QID) | RECTAL | Status: DC | PRN

## 2022-07-27 MED ORDER — SODIUM CHLORIDE 0.9 % IV SOLN: INTRAVENOUS | Status: DC

## 2022-07-27 MED ORDER — LORAZEPAM 2 MG/ML IJ SOLN
1.0000 mg | INTRAMUSCULAR | Status: DC | PRN
  Filled 2022-07-27: qty 1

## 2022-07-27 MED ORDER — THIAMINE HCL 100 MG/ML IJ SOLN
100.0000 mg | Freq: Every day | INTRAMUSCULAR | Status: DC
  Administered 2022-07-27: 100 mg via INTRAVENOUS
  Filled 2022-07-27: qty 2

## 2022-07-27 NOTE — H&P (Signed)
Pryor Guettler Ducre ZOX:[id number] DOB: 05-04-1966 DOA: 07/27/2022     PCP: [name]   Outpatient Specialists:  NONE    Patient arrived to ER on 07/27/22 at 1409 Referred by Attending Rancour, Jeannett Senior, MD   Patient coming from:    home Lives  With family    Chief Complaint:   Chief Complaint  Patient presents with   Pressure Sores    HPI: Dylan Weaver is a 56 y.o. male with medical history significant of  Alcohol abuse, history of Enterococcus bacteremia and ulcers in the past    Presented with   buttock pain Patient with known history of alcohol abuse states that he drove here from of [place] patient is actively hallucinating admits to drinking at least 6-12 beers per day have not been having any alcohol for past 3 days.  Reports that he has sustained a fall on his buttocks Was noted to be covered in feces and disheveled. There is redness of his buttocks consistent with likely cellulitis. Patient is hallucinating stating that he has slipped on ice Admits to alcohol abuse     Chronic anemia - baseline hg Hemoglobin & Hematocrit  Recent Labs    05/09/22 0813 05/10/22 0357 07/27/22 1550  HGB 9.7* 9.5* 11.2*     While in ER:  CT pelvis showed no evidence of free air or abscess Noted to have CIWA score of at least 2 and started on CIWA protocol.  Start vancomycin for Cellulitis   Ordered  CT HEAD   NON acute  CXR - No acute intrathoracic process.  CTabd/pelvis - . No subcutaneus soft tissue edema, emphysema, or organized fluid collection of the perineum. Scrotum only partially visualized. 2. Indeterminate subpleural ground-glass airspace opacity within the right middle lobe. 3. Trace hiatal hernia. 4. Hepatic steatosis. 5. Cholelithiasis with no CT evidence of acute cholecystitis. 6. Colonic diverticulosis with no acute diverticulitis.   Following Medications were ordered in ER: Medications  LORazepam (ATIVAN) tablet 1-4 mg  (has no administration in time range)    Or  LORazepam (ATIVAN) injection 1-4 mg (has no administration in time range)  thiamine (VITAMIN B1) tablet 100 mg ( Oral See Alternative 07/27/22 1603)    Or  thiamine (VITAMIN B1) injection 100 mg (100 mg Intravenous Given 07/27/22 1603)  folic acid (FOLVITE) tablet 1 mg (1 mg Oral Given 07/27/22 1648)  multivitamin with minerals tablet 1 tablet (1 tablet Oral Given 07/27/22 1651)  LORazepam (ATIVAN) injection 0-4 mg (0 mg Intravenous Hold 07/27/22 1650)    Followed by  LORazepam (ATIVAN) injection 0-4 mg (has no administration in time range)  potassium chloride SA (KLOR-CON M) CR tablet 40 mEq (has no administration in time range)  lactated ringers bolus 1,000 mL (0 mLs Intravenous Stopped 07/27/22 1838)  vancomycin (VANCOCIN) IVPB 1000 mg/200 mL premix (0 mg Intravenous Stopped 07/27/22 1736)  iohexol (OMNIPAQUE) 300 MG/ML solution 100 mL (100 mLs Intravenous Contrast Given 07/27/22 1751)     ED Triage Vitals  Enc Vitals Group     BP 07/27/22 1435 (!) 126/104     Pulse Rate 07/27/22 1435 (!) 102     Resp 07/27/22 1435 18     Temp 07/27/22 1435 (!) 97.4 F (36.3 C)     Temp Source 07/27/22 1435 Oral     SpO2 07/27/22 1435 97 %     Weight 07/27/22 1430 180 lb (81.6 kg)     Height 07/27/22 1430 6' (1.829 m)  Head Circumference --      Peak Flow --      Pain Score 07/27/22 1429 5     Pain Loc --      Pain Edu? --      Excl. in GC? --   TMAX(24)@     _________________________________________ Significant initial  Findings: Abnormal Labs Reviewed  CBC WITH DIFFERENTIAL/PLATELET - Abnormal; Notable for the following components:      Result Value   WBC 10.8 (*)    RBC 3.55 (*)    Hemoglobin 11.2 (*)    HCT 32.4 (*)    Neutro Abs 8.5 (*)    Abs Immature Granulocytes 0.09 (*)    All other components within normal limits  COMPREHENSIVE METABOLIC PANEL - Abnormal; Notable for the following components:   Sodium 128 (*)    Potassium 3.1  (*)    Chloride 87 (*)    Creatinine, Ser 0.52 (*)    Calcium 8.8 (*)    Albumin 3.0 (*)    AST 96 (*)    ALT 61 (*)    Alkaline Phosphatase 183 (*)    Total Bilirubin 2.5 (*)    All other components within normal limits  ACETAMINOPHEN LEVEL - Abnormal; Notable for the following components:   Acetaminophen (Tylenol), Serum <10 (*)    All other components within normal limits  SALICYLATE LEVEL - Abnormal; Notable for the following components:   Salicylate Lvl <7.0 (*)    All other components within normal limits  LACTIC ACID, PLASMA - Abnormal; Notable for the following components:   Lactic Acid, Venous 2.1 (*)    All other components within normal limits     _________________________ Troponin 3  ECG: Ordered Personally reviewed and interpreted by me showing: HR : 89 Rhythm: Sinus rhythm Incomplete left bundle branch block QTC 532    WBC     Component Value Date/Time   WBC 10.8 (H) 07/27/2022 1550   LYMPHSABS 1.5 07/27/2022 1550   MONOABS 0.5 07/27/2022 1550   EOSABS 0.1 07/27/2022 1550   BASOSABS 0.1 07/27/2022 1550        Lactic Acid, Venous    Component Value Date/Time   LATICACIDVEN 1.9 07/27/2022 1735        UA  ordered     Results for orders placed or performed during the hospital encounter of 04/25/22  Blood Culture (routine x 2)     Status: None   Collection Time: 04/25/22  2:44 PM   Specimen: Site Not Specified; Blood  Result Value Ref Range Status   Specimen Description   Final    SITE NOT SPECIFIED Performed at [facility], [address] [place], [place] [address]    Special Requests   Final    BOTTLES DRAWN AEROBIC AND ANAEROBIC Blood Culture adequate volume Performed at [facility], [address], [place], [place] [address]    Culture   Final    NO GROWTH 5 DAYS Performed at [facility], [address], [place], [place] [address]    Report Status 04/30/2022 FINAL  Final  Blood Culture  (routine x 2)     Status: None   Collection Time: 04/25/22  2:56 PM   Specimen: BLOOD LEFT HAND  Result Value Ref Range Status   Specimen Description   Final    BLOOD LEFT HAND Performed at [facility], [address], [place], [place] [address]    Special Requests  Final    BOTTLES DRAWN AEROBIC ONLY Blood Culture results may not be optimal due to an inadequate volume of blood received in culture bottles Performed at [facility], [address], [place], [place] [address]    Culture   Final    NO GROWTH 5 DAYS Performed at [facility], [address], [place], [place] [address]    Report Status 04/30/2022 FINAL  Final  SARS Coronavirus 2 by RT PCR (hospital order, performed in [facility] hospital lab) *cepheid single result test* Anterior Nasal Swab     Status: None   Collection Time: 04/25/22  5:04 PM   Specimen: Anterior Nasal Swab  Result Value Ref Range Status   SARS Coronavirus 2 by RT PCR NEGATIVE NEGATIVE Final    Comment: (NOTE) SARS-CoV-2 target nucleic acids are NOT DETECTED.  The SARS-CoV-2 RNA is generally detectable in upper and lower respiratory specimens during the acute phase of infection. The lowest concentration of SARS-CoV-2 viral copies this assay can detect is 250 copies / mL. A negative result does not preclude SARS-CoV-2 infection and should not be used as the sole basis for treatment or other patient management decisions.  A negative result may occur with improper specimen collection / handling, submission of specimen other than nasopharyngeal swab, presence of viral mutation(s) within the areas targeted by this assay, and inadequate number of viral copies (<250 copies / mL). A negative result must be combined with clinical observations, patient history, and epidemiological information.  Fact Sheet for Patients:   RoadLapTop.co.za  Fact Sheet for Healthcare  Providers: http://kim-miller.com/  This test is not yet approved or  cleared by the [place] FDA and has been authorized for detection and/or diagnosis of SARS-CoV-2 by FDA under an Emergency Use Authorization (EUA).  This EUA will remain in effect (meaning this test can be used) for the duration of the COVID-19 declaration under Section 564(b)(1) of the Act, 21 U.S.C. section 360bbb-3(b)(1), unless the authorization is terminated or revoked sooner.  Performed at [facility], [address], [place], [place] [address]   C Difficile Quick Screen w PCR reflex     Status: None   Collection Time: 04/26/22  6:00 PM   Specimen: STOOL  Result Value Ref Range Status   C Diff antigen NEGATIVE NEGATIVE Final   C Diff toxin NEGATIVE NEGATIVE Final   C Diff interpretation No C. difficile detected.  Final    Comment: Performed at [facility], [address], [place], [place] [address]  Gastrointestinal Panel by PCR , Stool     Status: Abnormal   Collection Time: 04/26/22  6:00 PM   Specimen: STOOL  Result Value Ref Range Status   Campylobacter species NOT DETECTED NOT DETECTED Final   Plesimonas shigelloides NOT DETECTED NOT DETECTED Final   Salmonella species NOT DETECTED NOT DETECTED Final   Yersinia enterocolitica NOT DETECTED NOT DETECTED Final   Vibrio species NOT DETECTED NOT DETECTED Final   Vibrio cholerae NOT DETECTED NOT DETECTED Final   Enteroaggregative E coli (EAEC) NOT DETECTED NOT DETECTED Final   Enteropathogenic E coli (EPEC) DETECTED (A) NOT DETECTED Final    Comment: RESULT CALLED TO, READ BACK BY AND VERIFIED WITH: DERRICK RICHTER 04/27/22 1028 KLW    Enterotoxigenic E coli (ETEC) NOT DETECTED NOT DETECTED Final   Shiga like toxin producing E coli (STEC) NOT DETECTED NOT DETECTED Final   Shigella/Enteroinvasive E coli (EIEC) NOT DETECTED NOT DETECTED Final  Cryptosporidium NOT DETECTED NOT DETECTED Final    Cyclospora cayetanensis NOT DETECTED NOT DETECTED Final   Entamoeba histolytica NOT DETECTED NOT DETECTED Final   Giardia lamblia NOT DETECTED NOT DETECTED Final   Adenovirus F40/41 NOT DETECTED NOT DETECTED Final   Astrovirus NOT DETECTED NOT DETECTED Final   Norovirus GI/GII NOT DETECTED NOT DETECTED Final   Rotavirus A NOT DETECTED NOT DETECTED Final   Sapovirus (I, II, IV, and V) NOT DETECTED NOT DETECTED Final    Comment: Performed at [facility], [address] [place], [place] [address]     _______________________________________________ Hospitalist was called for admission for   Alcohol withdrawal syndrome with perceptual disturbance   Cellulitis of buttock  Hypokalemia      The following Work up has been ordered so far:  Orders Placed This Encounter  Procedures   Critical Care   Blood culture (routine x 2)   DG Chest 2 View   CT Head Wo Contrast   CT ABDOMEN PELVIS W CONTRAST   CBC with Differential   Comprehensive metabolic panel   Urinalysis, Routine w reflex microscopic   Ethanol   Acetaminophen level   Salicylate level   Lipase, blood   Lactic acid, plasma   Protime-INR   TSH   Ammonia   Vitamin B1   Magnesium   Phosphorus   Vital signs every 6 hours X 48 hours, then per unit protocol   Refer to Sidebar Report for reference: ETOH Withdrawal Guidelines   Clinical Institute Withdrawal Assessent (CIWA)   If Ativan given, reassess Clinical Institute Withdrawal Assessment (CIWA) q 1 hour   Notify Pharmacy to change IV Ativan to PO if tolerating POs well.   Notify physician (specify)   Consult to hospitalist   EKG 12-Lead   EKG 12-Lead   EKG 12-Lead   Insert peripheral IV     OTHER Significant initial  Findings:  labs showing:    Recent Labs  Lab 07/27/22 1550  NA 128*  K 3.1*  CO2 27  GLUCOSE 84  BUN 8  CREATININE 0.52*  CALCIUM 8.8*    Cr   stable,   Lab Results  Component Value Date   CREATININE 0.52 (L) 07/27/2022    CREATININE 0.45 (L) 05/10/2022   CREATININE 0.51 (L) 05/09/2022    Recent Labs  Lab 07/27/22 1550  AST 96*  ALT 61*  ALKPHOS 183*  BILITOT 2.5*  PROT 8.1  ALBUMIN 3.0*   Lab Results  Component Value Date   CALCIUM 8.8 (L) 07/27/2022   PHOS 3.7 05/10/2022          Plt: Lab Results  Component Value Date   PLT 249 07/27/2022       COVID-19 Labs  No results for input(s): "DDIMER", "FERRITIN", "LDH", "CRP" in the last 72 hours.  Lab Results  Component Value Date   SARSCOV2NAA NEGATIVE 04/25/2022   SARSCOV2NAA POSITIVE (A) 10/22/2021   SARSCOV2NAA NEGATIVE 09/20/2021   SARSCOV2NAA NEGATIVE 08/25/2021        Recent Labs  Lab 07/27/22 1550  WBC 10.8*  NEUTROABS 8.5*  HGB 11.2*  HCT 32.4*  MCV 91.3  PLT 249    HG/HCT   stable,      Component Value Date/Time   HGB 11.2 (L) 07/27/2022 1550   HCT 32.4 (L) 07/27/2022 1550   MCV 91.3 07/27/2022 1550      Recent Labs  Lab 07/27/22 1550  LIPASE 31   Recent Labs  Lab 07/27/22 1735  AMMONIA 26      Cardiac Panel (last 3 results) No results for input(s): "CKTOTAL", "CKMB", "TROPONINI", "RELINDX" in the last 72 hours.  .car BNP (last 3 results) Recent Labs    08/25/21 1805  BNP 25.8          Cultures:    Component Value Date/Time   SDES  04/25/2022 1456    BLOOD LEFT HAND Performed at [facility], [address] [place], [place] [address]    SPECREQUEST  04/25/2022 1456    BOTTLES DRAWN AEROBIC ONLY Blood Culture results may not be optimal due to an inadequate volume of blood received in culture bottles Performed at [facility], [address], [place], [place] [address]    CULT  04/25/2022 1456    NO GROWTH 5 DAYS Performed at [facility], [address] [place], [place] [address]    REPTSTATUS 04/30/2022 FINAL 04/25/2022 1456     Radiological Exams on Admission: CT ABDOMEN PELVIS W CONTRAST  Result Date: 07/27/2022 CLINICAL DATA:   Sepsis cellulitis to buttock and perineum.r/o gas collection EXAM: CT ABDOMEN AND PELVIS WITH CONTRAST TECHNIQUE: Multidetector CT imaging of the abdomen and pelvis was performed using the standard protocol following bolus administration of intravenous contrast. RADIATION DOSE REDUCTION: This exam was performed according to the departmental dose-optimization program which includes automated exposure control, adjustment of the mA and/or kV according to patient size and/or use of iterative reconstruction technique. CONTRAST:  OMNIPAQUE IOHEXOL 300 MG/ML  SOLN COMPARISON:  None Available. FINDINGS: Lower chest: Indeterminate subpleural ground-glass airspace opacity within the right middle lobe (4:36.). Trace hiatal hernia. Hepatobiliary: The hepatic parenchyma is diffusely hypodense compared to the splenic parenchyma consistent with fatty infiltration. No focal liver abnormality. Calcified stones within the gallbladder lumen. No gallbladder wall thickening or pericholecystic fluid. No biliary dilatation. Pancreas: No focal lesion. Normal pancreatic contour. No surrounding inflammatory changes. No main pancreatic ductal dilatation. Spleen: Normal in size without focal abnormality. Adrenals/Urinary Tract: No adrenal nodule bilaterally. Bilateral kidneys enhance symmetrically. No hydronephrosis. No hydroureter. The urinary bladder is unremarkable. Stomach/Bowel: Stomach is within normal limits. No evidence of bowel wall thickening or dilatation. Colonic diverticulosis. Fatty infiltration of the wall of the appendix and ascending colon likely due to chronic inflammatory changes. The appendix is enlarged in caliber measuring up to 1 cm-nonspecific. No associated periappendiceal fat stranding or fluid. No appendicoliths. Gas is noted within the lumen of the appendix. Vascular/Lymphatic: No abdominal aorta or iliac aneurysm. Mild atherosclerotic plaque of the aorta and its branches. No abdominal, pelvic, or inguinal  lymphadenopathy. Reproductive: Prostate is unremarkable. Other: No intraperitoneal free fluid. No intraperitoneal free gas. No organized fluid collection. Musculoskeletal: Bilateral gynecomastia. No subcutaneus soft tissue edema, emphysema, or organized fluid collection of the perineum. Scrotum only partially visualized. Tiny fat containing umbilical hernia. No suspicious lytic or blastic osseous lesions. No acute displaced fracture. Multilevel degenerative changes of the spine. Old healed left rib fractures. IMPRESSION: 1. No subcutaneus soft tissue edema, emphysema, or organized fluid collection of the perineum. Scrotum only partially visualized. 2. Indeterminate subpleural ground-glass airspace opacity within the right middle lobe. 3. Trace hiatal hernia. 4. Hepatic steatosis. 5. Cholelithiasis with no CT evidence of acute cholecystitis. 6. Colonic diverticulosis with no acute diverticulitis. 7.  Aortic Atherosclerosis (ICD10-I70.0). Electronically Signed   By: Tish Frederickson M.D.   On: 07/27/2022 18:09   CT Head Wo Contrast  Result Date: 07/27/2022 CLINICAL DATA:  Altered mental status. EXAM: CT  HEAD WITHOUT CONTRAST TECHNIQUE: Contiguous axial images were obtained from the base of the skull through the vertex without intravenous contrast. RADIATION DOSE REDUCTION: This exam was performed according to the departmental dose-optimization program which includes automated exposure control, adjustment of the mA and/or kV according to patient size and/or use of iterative reconstruction technique. COMPARISON:  Head CT dated 04/25/2022. FINDINGS: Brain: Mild age-related atrophy and chronic microvascular ischemic changes. There is no acute intracranial hemorrhage. No mass effect or midline shift. No extra-axial fluid collection. Vascular: No hyperdense vessel or unexpected calcification. Skull: Normal. Negative for fracture or focal lesion. Sinuses/Orbits: Partially visualized left maxillary sinus retention cyst or  polyp. The remainder of the visualized paranasal sinuses and mastoid air cells are clear. Other: None IMPRESSION: 1. No acute intracranial pathology. 2. Mild age-related atrophy and chronic microvascular ischemic changes. Electronically Signed   By: Elgie Collard M.D.   On: 07/27/2022 18:03   DG Chest 2 View  Result Date: 07/27/2022 CLINICAL DATA:  Altered level of consciousness EXAM: CHEST - 2 VIEW COMPARISON:  04/25/2022 FINDINGS: Frontal and lateral views of the chest demonstrate an unremarkable cardiac silhouette. No acute airspace disease, effusion, or pneumothorax. Chronic L1 compression deformity. No acute bony abnormality. IMPRESSION: 1. No acute intrathoracic process. Electronically Signed   By: Sharlet Salina M.D.   On: 07/27/2022 15:51   _______________________________________________________________________________________________________ Latest  Blood pressure (!) 151/119, pulse 97, temperature 97.8 F (36.6 C), temperature source Oral, resp. rate 15, height 6' (1.829 m), weight 81.6 kg, SpO2 99 %.   Vitals  labs and radiology finding personally reviewed  Review of Systems:    Pertinent positives include:   fatigue, weight loss rash  Constitutional:  No weight loss, night sweats, Fevers, chills, HEENT:  No headaches, Difficulty swallowing,Tooth/dental problems,Sore throat,  No sneezing, itching, ear ache, nasal congestion, post nasal drip,  Cardio-vascular:  No chest pain, Orthopnea, PND, anasarca, dizziness, palpitations.no Bilateral lower extremity swelling  GI:  No heartburn, indigestion, abdominal pain, nausea, vomiting, diarrhea, change in bowel habits, loss of appetite, melena, blood in stool, hematemesis Resp:  no shortness of breath at rest. No dyspnea on exertion, No excess mucus, no productive cough, No non-productive cough, No coughing up of blood.No change in color of mucus.No wheezing. Skin:    No jaundice GU:  no dysuria, change in color of urine, no urgency  or frequency. No straining to urinate.  No flank pain.  Musculoskeletal:  No joint pain or no joint swelling. No decreased range of motion. No back pain.  Psych:  No change in mood or affect. No depression or anxiety. No memory loss.  Neuro: no localizing neurological complaints, no tingling, no weakness, no double vision, no gait abnormality, no slurred speech, no confusion  All systems reviewed and apart from HOPI all are negative _______________________________________________________________________________________________ Past Medical History:   Past Medical History:  Diagnosis Date   Acute liver failure with hepatic coma (HCC) 06/04/2021   ETOH abuse       Past Surgical History:  Procedure Laterality Date   BIOPSY  06/08/2021   Procedure: BIOPSY;  Surgeon: Imogene Burn, MD;  Location: [facility] ENDOSCOPY;  Service: Endoscopy;;   BIOPSY  06/10/2021   Procedure: BIOPSY;  Surgeon: Lynann Bologna, MD;  Location: [facility] ENDOSCOPY;  Service: Gastroenterology;;   COLONOSCOPY WITH PROPOFOL N/A 06/10/2021   Procedure: COLONOSCOPY WITH PROPOFOL;  Surgeon: Lynann Bologna, MD;  Location: [facility] ENDOSCOPY;  Service: Gastroenterology;  Laterality: N/A;   ESOPHAGOGASTRODUODENOSCOPY (EGD) WITH PROPOFOL N/A 06/08/2021  Procedure: ESOPHAGOGASTRODUODENOSCOPY (EGD) WITH PROPOFOL;  Surgeon: Imogene Burn, MD;  Location: [facility] ENDOSCOPY;  Service: Endoscopy;  Laterality: N/A;   NASAL SEPTUM SURGERY     WRIST SURGERY      Social History:  Ambulatory   independently       reports that he has been smoking cigarettes. He has been smoking an average of .5 packs per day. He has never used smokeless tobacco. He reports current alcohol use. He reports that he does not use drugs.     Family History:   Family History  Problem Relation Age of Onset   Hypertension Other    ______________________________________________________________________________________________ Allergies: No Known Allergies   Prior to Admission  medications   Medication Sig Start Date End Date Taking? Authorizing Provider  ascorbic acid (VITAMIN C) 500 MG tablet Take 1 tablet (500 mg total) by mouth 2 (two) times daily. 05/10/22   Noralee Stain, DO  ferrous sulfate 325 (65 FE) MG tablet Take 1 tablet (325 mg total) by mouth 2 (two) times daily with a meal. 05/10/22   Noralee Stain, DO  folic acid (FOLVITE) 1 MG tablet Take 1 tablet (1 mg total) by mouth daily. 05/10/22   Noralee Stain, DO  thiamine (VITAMIN B1) 100 MG tablet Take 1 tablet (100 mg total) by mouth daily. 05/11/22   Noralee Stain, DO  Zinc Oxide (TRIPLE PASTE) 12.8 % ointment Apply 1 application topically 2 (two) times daily as needed. Patient not taking: Reported on 04/25/2022 11/04/21   Maretta Bees, MD    ___________________________________________________________________________________________________ Physical Exam:    07/27/2022    6:51 PM 07/27/2022    5:00 PM 07/27/2022    4:49 PM  Vitals with BMI  Systolic 151 110 161  Diastolic 119 98 87  Pulse 97 83 103     1. General:  in No  Acute distress   Chronically ill   -appearing disheveled multiple excoriations 2. Psychological: Alert and   Oriented to self not situation 3. Head/ENT:    Dry Mucous Membranes                          Head Non traumatic, neck supple                           Poor Dentition 4. SKIN: n decreased Skin turgor,  Skin clean Dry and intact multiple rashes and excoriations noted     5. Heart: Regular rate and rhythm no  Murmur, no Rub or gallop 6. Lungs:  Clear to auscultation bilaterally, no wheezes or crackles   7. Abdomen: Soft,  non-tender, Non distended   8. Lower extremities: no clubbing, mild cyanosis, no  edema de crease capillary refill bilaterally 9. Neurologically Grossly intact, moving all 4 extremities equally tremulous 10. MSK: Normal range of motion    Chart has been  reviewed  ______________________________________________________________________________________________  Assessment/Plan 56 y.o. male with medical history significant of  Alcohol abuse, history of Enterococcus bacteremia and ulcers in the past    Admitted for Alcohol withdrawal syndrome with perceptual disturbance   Cellulitis of buttock  Hypokalemia      Present on Admission:  Cellulitis  QT prolongation  Alcohol use disorder with alcohol withdrawal   Hyponatremia  Hypokalemia    QT prolongation - will monitor on tele avoid QT prolonging medications, rehydrate correct electrolytes   Alcohol use disorder with alcohol withdrawal  Order CIWA protocol and  Transitional care consult  Cellulitis -admit per  cellulitis protocol will      continue current antibiotic choice vancomycin and cefepime      plain films showed:  no evidence of air no evidence of osteomyelitis  no  foreign   objects      Will obtain MRSA screening,       obtain blood cultures      further antibiotic adjustment pending above results   Hyponatremia In the setting of alcohol abuse.  Obtain urine electrolytes and rehydrate follow sodium levels  Hypokalemia Replace and check magnesium level  Patient appears to be possibly confabulating.  Thiamine already administered When stable can attempt to obtain MRI of the brain at this time patient unable to answer screening questions no family at bedside If no improvement may benefit from neurology consult  Other plan as per orders.  DVT prophylaxis:  SCD       Code Status:    Code Status: Prior FULL CODE  as per patient  I had personally discussed CODE STATUS with patient     Family Communication:   Family not at  Bedside    Disposition Plan:     Would benefit from placement  Following barriers for discharge:                            Electrolytes corrected                                                     Pain controlled with PO medications                                Afebrile, white count improving able to transition to PO antibiotics                                                 Would benefit from PT/OT eval prior to DC  Ordered                   Swallow eval - SLP ordered                                     Transition of care consulted                   Nutrition    consulted                                Consults called:  none if no improvement in mental status would benefit from neurology consult and further imaging  Admission status:  ED Disposition     ED Disposition  Admit   Condition  --   [place]: [place] [[id number]]  Level of Care: Progressive [102]  Admit to Progressive based on following criteria: NEUROLOGICAL AND NEUROSURGICAL complex patients with significant risk of instability, who do not meet ICU criteria, yet require close observation or frequent  assessment (< / = every 2 - 4 hours) with medical / nursing intervention.  May admit patient to [facility] or [facility] if equivalent level of care is available:: No  Covid Evaluation: Asymptomatic - no recent exposure (last 10 days) testing not required  Diagnosis: Cellulitis [[id number]]  Admitting Physician: Therisa Doyne [[id number]]  Attending Physician: Therisa Doyne [[id number]]  Certification:: I certify this patient will need inpatient services for at least 2 midnights  Estimated Length of Stay: 2           inpatient     I Expect 2 midnight stay secondary to severity of patient's current illness need for inpatient interventions justified by the following:  hemodynamic instability despite optimal treatment (tachycardia  )   Severe lab/radiological/exam abnormalities including:    cellulitis and extensive comorbidities including:  substance abuse   That are currently affecting medical management.   I expect  patient to be hospitalized for 2 midnights requiring inpatient medical care.  Patient is at high  risk for adverse outcome (such as loss of life or disability) if not treated.  Indication for inpatient stay as follows:  Severe change from baseline regarding mental status     Need for IV antibiotics, IV fluids,     Level of care    progressive tele indefinitely please discontinue once patient no longer qualifies COVID-19 Labs   Yailin Biederman 07/27/2022, 10:32 PM    [facility]     after 2 AM please page floor coverage PA If 7AM-7PM, please contact the day team taking care of the patient using [email or web address]   Patient was evaluated in the context of the global COVID-19 pandemic, which necessitated consideration that the patient might be at risk for infection with the SARS-CoV-2 virus that causes COVID-19. Institutional protocols and algorithms that pertain to the evaluation of patients at risk for COVID-19 are in a state of rapid change based on information released by regulatory bodies including the CDC and federal and state organizations. These policies and algorithms were followed during the patient's care.

## 2022-07-27 NOTE — Assessment & Plan Note (Signed)
-  admit per cellulitis protocol will      continue current antibiotic choice vancomycin and cefepime      plain films showed:  no evidence of air no evidence of osteomyelitis  no  foreign   objects      Will obtain MRSA screening,       obtain blood cultures      further antibiotic adjustment pending above results

## 2022-07-27 NOTE — Assessment & Plan Note (Signed)
Order CIWA protocol and Transitional care consult

## 2022-07-27 NOTE — ED Notes (Signed)
Pt in bed, number 20 gauge iv placed L ac, md notified that abx was complete prior to second set of blood cultures obtained.  Blood culture number 2 drawn from iv start and sent to lab.

## 2022-07-27 NOTE — ED Notes (Signed)
Pt was able to take his medication and after that he became very confused. He was unaware of what city he was in, and thought some man named "Eduard Clos" was out "down there on the left". He then said "Eduard Clos" was on the TV and that "his name was right below him". "Eduard Clos" was not on the TV. The name read "Sam Acho". Pt began to get loud with this medic and I informed he was not going to yell at me. He stated "yes I am if you keep asking me these dumb ass questions trying to be a smart ass". I told him I would not be disrespected and exited the room.

## 2022-07-27 NOTE — Progress Notes (Signed)
Pharmacy Antibiotic Note  Dylan Weaver is a 56 y.o. male admitted on 07/27/2022 with concern for cellulitis. Pharmacy has been consulted for vancomycin and cefepime dosing.  Plan: -Vancomycin 1 g IV given 1660 - follow with vanc 1500 mg IV q12h to start at 2300 (given insufficient LD) -Cefepime 2 g IV q8h -Pharmacy to continue to follow renal function, cultures and clinical progress for dose adjustments and de-escalation as indicated   Height: 6' (182.9 cm) Weight: 81.6 kg (180 lb) IBW/kg (Calculated) : 77.6  Temp (24hrs), Avg:97.6 F (36.4 C), Min:97.4 F (36.3 C), Max:97.8 F (36.6 C)  Recent Labs  Lab 07/27/22 1550 07/27/22 1735  WBC 10.8*  --   CREATININE 0.52*  --   LATICACIDVEN 2.1* 1.9    Estimated Creatinine Clearance: 113.2 mL/min (A) (by C-G formula based on SCr of 0.52 mg/dL (L)).    No Known Allergies  Antimicrobials this admission: Vancomycin 10/26 >> Cefepime 10/26 >>  Dose adjustments this admission: NA  Microbiology results: 10/26 BCx: pending 10/26 UCx: ordered  10/26 MRSA PCR: ordered  Thank you for allowing pharmacy to be a part of this patient's care.  Tawnya Crook, PharmD, BCPS Clinical Pharmacist 07/27/2022 7:48 PM

## 2022-07-27 NOTE — Subjective & Objective (Signed)
Patient with known history of alcohol abuse states that he drove here from of [place] patient is actively hallucinating admits to drinking at least 6-12 beers per day have not been having any alcohol for past 3 days.  Reports that he has sustained a fall on his buttocks Was noted to be covered in feces and disheveled. There is redness of his buttocks consistent with likely cellulitis. Patient is hallucinating stating that he has slipped on ice

## 2022-07-27 NOTE — ED Triage Notes (Addendum)
Patient BIB [facility] from home. Incontinent. Not complaining of any pain. Patient said his neighbor called EMS, because his family pressured them to but he did not elaborate. Patient has ulcer sores on his sacrum and said his buttocks feels raw. Alert and oriented x4.

## 2022-07-27 NOTE — Assessment & Plan Note (Signed)
In the setting of alcohol abuse.  Obtain urine electrolytes and rehydrate follow sodium levels

## 2022-07-27 NOTE — ED Provider Notes (Signed)
[place] DEPT Provider Note   CSN: [id number] Arrival date & time: 07/27/22  1409     History  Chief Complaint  Patient presents with   Pressure Sores    Dylan Weaver is a 56 y.o. male.  Patient with a history of hypertension and alcohol abuse here with buttock soreness.  He is a poor historian.  States he drove from [place] last week and had a sore but since then.  He also reports he fell twice today "on the ice" though is notably 70 degrees outside.  He denies hitting his head or losing consciousness.  States he is here because he got an argument with his mother and she wanted him to come to the hospital.  He denies any head, neck, back, chest or abdominal pain.  Complains of soreness to his buttock and perineum where he has redness and draining wounds.  No fever.  He is mildly tremulous and states his last alcoholic drink was 3 days ago.  He only drinks between 4-12 beers a day.  Denies any other drug use. States he takes high blood pressure medication only. Denies any suicidal or homicidal thoughts.  The history is provided by the patient.       Home Medications Prior to Admission medications   Medication Sig Start Date End Date Taking? Authorizing Provider  ascorbic acid (VITAMIN C) 500 MG tablet Take 1 tablet (500 mg total) by mouth 2 (two) times daily. 05/10/22   Dessa Phi, DO  ferrous sulfate 325 (65 FE) MG tablet Take 1 tablet (325 mg total) by mouth 2 (two) times daily with a meal. 05/10/22   Dessa Phi, DO  folic acid (FOLVITE) 1 MG tablet Take 1 tablet (1 mg total) by mouth daily. 05/10/22   Dessa Phi, DO  thiamine (VITAMIN B1) 100 MG tablet Take 1 tablet (100 mg total) by mouth daily. 05/11/22   Dessa Phi, DO  Zinc Oxide (TRIPLE PASTE) 12.8 % ointment Apply 1 application topically 2 (two) times daily as needed. Patient not taking: Reported on 04/25/2022 11/04/21   Jonetta Osgood, MD      Allergies    Patient  has no known allergies.    Review of Systems   Review of Systems  Constitutional:  Positive for fatigue. Negative for activity change, appetite change and fever.  Gastrointestinal:  Negative for abdominal pain, nausea and vomiting.  Musculoskeletal:  Negative for arthralgias and myalgias.  Skin:  Positive for rash and wound.  Neurological:  Positive for weakness and light-headedness.   all other systems are negative except as noted in the HPI and PMH.    Physical Exam Updated Vital Signs BP (!) 126/104 (BP Location: Left Arm)   Pulse (!) 102   Temp (!) 97.4 F (36.3 C) (Oral)   Resp 18   Ht 6' (1.829 m)   Wt 81.6 kg   SpO2 97%   BMI 24.41 kg/m  Physical Exam Vitals and nursing note reviewed.  Constitutional:      General: He is not in acute distress.    Appearance: He is well-developed.     Comments: Disheveled, covered in urine and feces.  Tremulous.  HENT:     Head: Normocephalic and atraumatic.     Mouth/Throat:     Pharynx: No oropharyngeal exudate.  Eyes:     Conjunctiva/sclera: Conjunctivae normal.     Pupils: Pupils are equal, round, and reactive to light.  Neck:     Comments: No  meningismus. Cardiovascular:     Rate and Rhythm: Normal rate and regular rhythm.     Heart sounds: Normal heart sounds. No murmur heard. Pulmonary:     Effort: Pulmonary effort is normal. No respiratory distress.     Breath sounds: Normal breath sounds.  Abdominal:     Palpations: Abdomen is soft.     Tenderness: There is no abdominal tenderness. There is no guarding or rebound.  Genitourinary:    Comments: Extensive erythema involving low back, buttocks, perineum, scrotum as depicted.  No crepitus. Musculoskeletal:        General: No tenderness. Normal range of motion.     Cervical back: Normal range of motion and neck supple.  Skin:    General: Skin is warm.     Findings: Erythema and rash present.  Neurological:     Mental Status: He is alert and oriented to person, place,  and time.     Cranial Nerves: No cranial nerve deficit.     Motor: No abnormal muscle tone.     Coordination: Coordination normal.     Comments:  5/5 strength throughout. CN 2-12 intact.Equal grip strength.   Upper extremity tremors  Psychiatric:        Behavior: Behavior normal.        ED Results / Procedures / Treatments   Labs (all labs ordered are listed, but only abnormal results are displayed) Labs Reviewed  CBC WITH DIFFERENTIAL/PLATELET - Abnormal; Notable for the following components:      Result Value   WBC 10.8 (*)    RBC 3.55 (*)    Hemoglobin 11.2 (*)    HCT 32.4 (*)    Neutro Abs 8.5 (*)    Abs Immature Granulocytes 0.09 (*)    All other components within normal limits  COMPREHENSIVE METABOLIC PANEL - Abnormal; Notable for the following components:   Sodium 128 (*)    Potassium 3.1 (*)    Chloride 87 (*)    Creatinine, Ser 0.52 (*)    Calcium 8.8 (*)    Albumin 3.0 (*)    AST 96 (*)    ALT 61 (*)    Alkaline Phosphatase 183 (*)    Total Bilirubin 2.5 (*)    All other components within normal limits  ACETAMINOPHEN LEVEL - Abnormal; Notable for the following components:   Acetaminophen (Tylenol), Serum <10 (*)    All other components within normal limits  SALICYLATE LEVEL - Abnormal; Notable for the following components:   Salicylate Lvl <0.1 (*)    All other components within normal limits  LACTIC ACID, PLASMA - Abnormal; Notable for the following components:   Lactic Acid, Venous 2.1 (*)    All other components within normal limits  CULTURE, BLOOD (ROUTINE X 2)  CULTURE, BLOOD (ROUTINE X 2)  ETHANOL  LIPASE, BLOOD  LACTIC ACID, PLASMA  PROTIME-INR  TSH  AMMONIA  URINALYSIS, ROUTINE W REFLEX MICROSCOPIC  VITAMIN B1  MAGNESIUM  PHOSPHORUS  TROPONIN I (HIGH SENSITIVITY)  TROPONIN I (HIGH SENSITIVITY)    EKG EKG Interpretation  Date/Time:  Thursday July 27 2022 16:07:51 EDT Ventricular Rate:  89 PR Interval:  171 QRS  Duration: 115 QT Interval:  437 QTC Calculation: 532 R Axis:   17 Text Interpretation: Sinus rhythm Incomplete left bundle branch block Artifact in lead(s) I II III aVR aVL V1 V2 V3 Nonspecific ST and T wave abnormality Confirmed by Ezequiel Essex [phone]) on 07/27/2022 4:14:30 PM  Radiology CT ABDOMEN PELVIS W CONTRAST  Result Date: 07/27/2022 CLINICAL DATA:  Sepsis cellulitis to buttock and perineum.r/o gas collection EXAM: CT ABDOMEN AND PELVIS WITH CONTRAST TECHNIQUE: Multidetector CT imaging of the abdomen and pelvis was performed using the standard protocol following bolus administration of intravenous contrast. RADIATION DOSE REDUCTION: This exam was performed according to the departmental dose-optimization program which includes automated exposure control, adjustment of the mA and/or kV according to patient size and/or use of iterative reconstruction technique. CONTRAST:  149mL OMNIPAQUE IOHEXOL 300 MG/ML  SOLN COMPARISON:  None Available. FINDINGS: Lower chest: Indeterminate subpleural ground-glass airspace opacity within the right middle lobe (4:36.). Trace hiatal hernia. Hepatobiliary: The hepatic parenchyma is diffusely hypodense compared to the splenic parenchyma consistent with fatty infiltration. No focal liver abnormality. Calcified stones within the gallbladder lumen. No gallbladder wall thickening or pericholecystic fluid. No biliary dilatation. Pancreas: No focal lesion. Normal pancreatic contour. No surrounding inflammatory changes. No main pancreatic ductal dilatation. Spleen: Normal in size without focal abnormality. Adrenals/Urinary Tract: No adrenal nodule bilaterally. Bilateral kidneys enhance symmetrically. No hydronephrosis. No hydroureter. The urinary bladder is unremarkable. Stomach/Bowel: Stomach is within normal limits. No evidence of bowel wall thickening or dilatation. Colonic diverticulosis. Fatty infiltration of the wall of the appendix and ascending colon likely due to  chronic inflammatory changes. The appendix is enlarged in caliber measuring up to 1 cm-nonspecific. No associated periappendiceal fat stranding or fluid. No appendicoliths. Gas is noted within the lumen of the appendix. Vascular/Lymphatic: No abdominal aorta or iliac aneurysm. Mild atherosclerotic plaque of the aorta and its branches. No abdominal, pelvic, or inguinal lymphadenopathy. Reproductive: Prostate is unremarkable. Other: No intraperitoneal free fluid. No intraperitoneal free gas. No organized fluid collection. Musculoskeletal: Bilateral gynecomastia. No subcutaneus soft tissue edema, emphysema, or organized fluid collection of the perineum. Scrotum only partially visualized. Tiny fat containing umbilical hernia. No suspicious lytic or blastic osseous lesions. No acute displaced fracture. Multilevel degenerative changes of the spine. Old healed left rib fractures. IMPRESSION: 1. No subcutaneus soft tissue edema, emphysema, or organized fluid collection of the perineum. Scrotum only partially visualized. 2. Indeterminate subpleural ground-glass airspace opacity within the right middle lobe. 3. Trace hiatal hernia. 4. Hepatic steatosis. 5. Cholelithiasis with no CT evidence of acute cholecystitis. 6. Colonic diverticulosis with no acute diverticulitis. 7.  Aortic Atherosclerosis (ICD10-I70.0). Electronically Signed   By: Iven Finn M.D.   On: 07/27/2022 18:09   CT Head Wo Contrast  Result Date: 07/27/2022 CLINICAL DATA:  Altered mental status. EXAM: CT HEAD WITHOUT CONTRAST TECHNIQUE: Contiguous axial images were obtained from the base of the skull through the vertex without intravenous contrast. RADIATION DOSE REDUCTION: This exam was performed according to the departmental dose-optimization program which includes automated exposure control, adjustment of the mA and/or kV according to patient size and/or use of iterative reconstruction technique. COMPARISON:  Head CT dated 04/25/2022. FINDINGS:  Brain: Mild age-related atrophy and chronic microvascular ischemic changes. There is no acute intracranial hemorrhage. No mass effect or midline shift. No extra-axial fluid collection. Vascular: No hyperdense vessel or unexpected calcification. Skull: Normal. Negative for fracture or focal lesion. Sinuses/Orbits: Partially visualized left maxillary sinus retention cyst or polyp. The remainder of the visualized paranasal sinuses and mastoid air cells are clear. Other: None IMPRESSION: 1. No acute intracranial pathology. 2. Mild age-related atrophy and chronic microvascular ischemic changes. Electronically Signed   By: Anner Crete M.D.   On: 07/27/2022 18:03   DG Chest 2 View  Result Date: 07/27/2022 CLINICAL DATA:  Altered level of consciousness EXAM: CHEST -  2 VIEW COMPARISON:  04/25/2022 FINDINGS: Frontal and lateral views of the chest demonstrate an unremarkable cardiac silhouette. No acute airspace disease, effusion, or pneumothorax. Chronic L1 compression deformity. No acute bony abnormality. IMPRESSION: 1. No acute intrathoracic process. Electronically Signed   By: Randa Ngo M.D.   On: 07/27/2022 15:51    Procedures .Critical Care  Performed by: Ezequiel Essex, MD Authorized by: Ezequiel Essex, MD   Critical care provider statement:    Critical care time (minutes):  35   Critical care time was exclusive of:  Separately billable procedures and treating other patients   Critical care was necessary to treat or prevent imminent or life-threatening deterioration of the following conditions: Alcohol withdrawal.   Critical care was time spent personally by me on the following activities:  Development of treatment plan with patient or surrogate, discussions with consultants, evaluation of patient's response to treatment, examination of patient, ordering and review of laboratory studies, ordering and review of radiographic studies, ordering and performing treatments and interventions, pulse  oximetry, re-evaluation of patient's condition, review of old charts and blood draw for specimens   I assumed direction of critical care for this patient from another provider in my specialty: no     Care discussed with: admitting provider       Medications Ordered in ED Medications  lactated ringers bolus 1,000 mL (has no administration in time range)  LORazepam (ATIVAN) tablet 1-4 mg (has no administration in time range)    Or  LORazepam (ATIVAN) injection 1-4 mg (has no administration in time range)  thiamine (VITAMIN B1) tablet 100 mg (has no administration in time range)    Or  thiamine (VITAMIN B1) injection 100 mg (has no administration in time range)  folic acid (FOLVITE) tablet 1 mg (has no administration in time range)  multivitamin with minerals tablet 1 tablet (has no administration in time range)  LORazepam (ATIVAN) injection 0-4 mg (has no administration in time range)    Followed by  LORazepam (ATIVAN) injection 0-4 mg (has no administration in time range)    ED Course/ Medical Decision Making/ A&P                           Medical Decision Making Amount and/or Complexity of Data Reviewed Labs: ordered. Radiology: ordered. ECG/medicine tests: ordered.  Risk OTC drugs. Prescription drug management. Decision regarding hospitalization.  Patient brought in from home with wounds, failure to thrive. Stable vitals, appearing tremulous. Suspect some early alcohol withdrawal.   IV fluids, CIWA protocol.  Appears to have cellulitis involving buttock and perineum.  Will need to rule out necrotizing infection.  Will treat for sepsis and possible cellulitis currently.  Initiate alcohol withdrawal protocol given his tremors and tachycardia.  EKG shows tremors as well as some ST depression anteriorly which is new and nonspecific.  Denies chest pain.  Work-up significant for mild lactic acidosis.  Mild hypokalemia of 3.1.  Heart rate has improved to the 80s.  Patient  given IV fluids, IV Ativan, folate and thiamine with concern for alcohol withdrawal.  Started on broad-spectrum antibiotics after cultures were obtained for his cellulitis of his perineum.  CT scan is obtained that shows no soft tissue gas or drainable abscess.  No evidence of Fournier's gangrene.  Hospitalization for IV hydration, IV antibiotics for perineal cellulitis as well as alcohol withdrawal.  D/w Dr. Roel Cluck.        Final Clinical Impression(s) / ED Diagnoses Final  diagnoses:  Alcohol withdrawal syndrome with perceptual disturbance (HCC)  Cellulitis of buttock  Hypokalemia    Rx / DC Orders ED Discharge Orders     None         Nikodem Leadbetter, Annie Main, MD 07/27/22 1920

## 2022-07-27 NOTE — Assessment & Plan Note (Signed)
-   will monitor on tele avoid QT prolonging medications, rehydrate correct electrolytes ? ?

## 2022-07-27 NOTE — Assessment & Plan Note (Signed)
Replace and check magnesium level 

## 2022-07-28 ENCOUNTER — Other Ambulatory Visit: Payer: Self-pay

## 2022-07-28 ENCOUNTER — Inpatient Hospital Stay (HOSPITAL_COMMUNITY): Payer: No Typology Code available for payment source

## 2022-07-28 DIAGNOSIS — F1020 Alcohol dependence, uncomplicated: Secondary | ICD-10-CM | POA: Diagnosis not present

## 2022-07-28 DIAGNOSIS — F10932 Alcohol use, unspecified with withdrawal with perceptual disturbance: Secondary | ICD-10-CM | POA: Diagnosis not present

## 2022-07-28 DIAGNOSIS — R9431 Abnormal electrocardiogram [ECG] [EKG]: Secondary | ICD-10-CM

## 2022-07-28 DIAGNOSIS — L03317 Cellulitis of buttock: Secondary | ICD-10-CM | POA: Diagnosis not present

## 2022-07-28 DIAGNOSIS — E876 Hypokalemia: Secondary | ICD-10-CM | POA: Diagnosis not present

## 2022-07-28 LAB — URINALYSIS, MICROSCOPIC (REFLEX)
Bacteria, UA: NONE SEEN
RBC / HPF: NONE SEEN RBC/hpf (ref 0–5)
Squamous Epithelial / HPF: NONE SEEN (ref 0–5)
WBC, UA: NONE SEEN WBC/hpf (ref 0–5)

## 2022-07-28 LAB — URINALYSIS, ROUTINE W REFLEX MICROSCOPIC
Bilirubin Urine: NEGATIVE
Glucose, UA: NEGATIVE mg/dL
Hgb urine dipstick: NEGATIVE
Ketones, ur: 15 mg/dL — AB
Nitrite: NEGATIVE
Protein, ur: NEGATIVE mg/dL
Specific Gravity, Urine: <1.005 — ABNORMAL LOW (ref 1.005–1.030)
pH: 7 (ref 5.0–8.0)

## 2022-07-28 LAB — ECHOCARDIOGRAM COMPLETE
AR max vel: 2.56 cm2
AV Area VTI: 2.99 cm2
AV Area mean vel: 2.68 cm2
AV Mean grad: 3 mmHg
AV Peak grad: 5.7 mmHg
Ao pk vel: 1.19 m/s
Area-P 1/2: 3.65 cm2
Height: 72 in
MV VTI: 3.73 cm2
S' Lateral: 2.3 cm
Weight: 2880 oz

## 2022-07-28 LAB — COMPREHENSIVE METABOLIC PANEL
ALT: 53 U/L — ABNORMAL HIGH (ref 0–44)
AST: 83 U/L — ABNORMAL HIGH (ref 15–41)
Albumin: 2.7 g/dL — ABNORMAL LOW (ref 3.5–5.0)
Alkaline Phosphatase: 158 U/L — ABNORMAL HIGH (ref 38–126)
Anion gap: 10 (ref 5–15)
BUN: 8 mg/dL (ref 6–20)
CO2: 25 mmol/L (ref 22–32)
Calcium: 8.5 mg/dL — ABNORMAL LOW (ref 8.9–10.3)
Chloride: 93 mmol/L — ABNORMAL LOW (ref 98–111)
Creatinine, Ser: 0.51 mg/dL — ABNORMAL LOW (ref 0.61–1.24)
GFR, Estimated: >60 mL/min (ref >60)
Glucose, Bld: 74 mg/dL (ref 70–99)
Potassium: 3.6 mmol/L (ref 3.5–5.1)
Sodium: 128 mmol/L — ABNORMAL LOW (ref 135–145)
Total Bilirubin: 2.1 mg/dL — ABNORMAL HIGH (ref 0.3–1.2)
Total Protein: 7.3 g/dL (ref 6.5–8.1)

## 2022-07-28 LAB — CREATININE, URINE, RANDOM: Creatinine, Urine: 65 mg/dL

## 2022-07-28 LAB — MAGNESIUM: Magnesium: 2 mg/dL (ref 1.7–2.4)

## 2022-07-28 LAB — CBC
HCT: 26.9 % — ABNORMAL LOW (ref 39.0–52.0)
Hemoglobin: 9.2 g/dL — ABNORMAL LOW (ref 13.0–17.0)
MCH: 31.6 pg (ref 26.0–34.0)
MCHC: 34.2 g/dL (ref 30.0–36.0)
MCV: 92.4 fL (ref 80.0–100.0)
Platelets: 257 10*3/uL (ref 150–400)
RBC: 2.91 MIL/uL — ABNORMAL LOW (ref 4.22–5.81)
RDW: 14.5 % (ref 11.5–15.5)
WBC: 8.2 10*3/uL (ref 4.0–10.5)
nRBC: 0.2 % (ref 0.0–0.2)

## 2022-07-28 LAB — OSMOLALITY, URINE: Osmolality, Ur: 360 mOsm/kg (ref 300–900)

## 2022-07-28 LAB — PREALBUMIN: Prealbumin: 6 mg/dL — ABNORMAL LOW (ref 18–38)

## 2022-07-28 LAB — OSMOLALITY: Osmolality: 267 mOsm/kg — ABNORMAL LOW (ref 275–295)

## 2022-07-28 LAB — PHOSPHORUS: Phosphorus: 2.6 mg/dL (ref 2.5–4.6)

## 2022-07-28 LAB — PROTIME-INR
INR: 1.1 (ref 0.8–1.2)
Prothrombin Time: 13.6 seconds (ref 11.4–15.2)

## 2022-07-28 LAB — SODIUM, URINE, RANDOM: Sodium, Ur: 20 mmol/L

## 2022-07-28 LAB — APTT: aPTT: 40 seconds — ABNORMAL HIGH (ref 24–36)

## 2022-07-28 MED ORDER — MAGNESIUM SULFATE IN D5W 1-5 GM/100ML-% IV SOLN
1.0000 g | Freq: Once | INTRAVENOUS | Status: AC
  Administered 2022-07-28: 1 g via INTRAVENOUS
  Filled 2022-07-28: qty 100

## 2022-07-28 MED ORDER — ACETAMINOPHEN 500 MG PO TABS
500.0000 mg | ORAL_TABLET | Freq: Four times a day (QID) | ORAL | Status: DC | PRN
  Administered 2022-07-30 – 2022-08-02 (×2): 500 mg via ORAL
  Filled 2022-07-28 (×2): qty 1

## 2022-07-28 MED ORDER — LORAZEPAM 2 MG/ML IJ SOLN
0.0000 mg | INTRAMUSCULAR | Status: DC
  Administered 2022-07-28: 2 mg via INTRAVENOUS
  Administered 2022-07-28: 1 mg via INTRAVENOUS
  Filled 2022-07-28 (×2): qty 1

## 2022-07-28 MED ORDER — POTASSIUM CHLORIDE CRYS ER 20 MEQ PO TBCR
40.0000 meq | EXTENDED_RELEASE_TABLET | Freq: Once | ORAL | Status: AC
  Administered 2022-07-28: 40 meq via ORAL
  Filled 2022-07-28 (×2): qty 2

## 2022-07-28 MED ORDER — JUVEN PO PACK
1.0000 | PACK | Freq: Two times a day (BID) | ORAL | Status: DC
  Administered 2022-07-30 – 2022-08-09 (×18): 1 via ORAL
  Filled 2022-07-28 (×19): qty 1

## 2022-07-28 MED ORDER — LORAZEPAM 2 MG/ML IJ SOLN: 0.0000 mg | Freq: Three times a day (TID) | INTRAMUSCULAR | Status: DC

## 2022-07-28 MED ORDER — ACETAMINOPHEN 325 MG PO TABS: 650.0000 mg | ORAL_TABLET | Freq: Four times a day (QID) | ORAL | Status: DC | PRN

## 2022-07-28 MED ORDER — POTASSIUM CHLORIDE IN NACL 20-0.9 MEQ/L-% IV SOLN
INTRAVENOUS | Status: DC
  Filled 2022-07-28 (×8): qty 1000

## 2022-07-28 MED ORDER — OXYCODONE HCL 5 MG PO TABS: 5.0000 mg | ORAL_TABLET | ORAL | Status: DC | PRN

## 2022-07-28 MED ORDER — ENSURE ENLIVE PO LIQD
237.0000 mL | Freq: Two times a day (BID) | ORAL | Status: DC
  Administered 2022-07-28 – 2022-08-09 (×18): 237 mL via ORAL

## 2022-07-28 MED ORDER — LORAZEPAM 2 MG/ML IJ SOLN: 0.0000 mg | Freq: Two times a day (BID) | INTRAMUSCULAR | Status: DC

## 2022-07-28 MED ORDER — LORAZEPAM 2 MG/ML IJ SOLN: 0.0000 mg | Freq: Four times a day (QID) | INTRAMUSCULAR | Status: DC

## 2022-07-28 NOTE — Consult Note (Signed)
WOC Nurse Consult Note: Reason for Consult:irritant contact dermatitis and erythema intertrigo due to urinary and fecal incontinence. Wound type: irritant contact dermatitis  ICD-10 CM Codes for Irritant Dermatitis L24A2 - Due to fecal, urinary or dual incontinence L24A9 - Due to friction or contact with other specified body fluids L30.4  - Erythema intertrigo. Also used for abrasion of the hand, chafing of the skin, dermatitis due to sweating and friction, friction dermatitis, friction eczema, and genital/thigh intertrigo.   Pressure Injury POA: N/A Measurement:See photographs. Diffuse areas affected in the bilateral inguinal, buttock, perineal, genital and gluteal cleft areas Wound bed:red, dry Drainage (amount, consistency, odor) dried serum (none) Periwound: erythema, induration to left buttock Dressing procedure/placement/frequency:I have provided patient with a mattress replacement with low air loss feature to mitigate pressure and microclimate. Topical acre will be with cleansing using the house pH balanced, no rinse skin cleanser followed by application of our hour moisture barrier cream (clear zinc oxide), Critic Aid Clear. Turning and repositioning to minimize time in the supine position and the flotation of heels as well as the placement of a sacral silicone foam dressing for PI prevention are provided.  [facility] nursing team will not follow, but will remain available to this patient, the nursing and medical teams.  Please re-consult if needed.  Thank you for inviting [place] to participate in this patient's Plan of Care.  Maudie Flakes, MSN, RN, CNS, [place], Serita Grammes, [employer], [employer] phone:  [phone]

## 2022-07-28 NOTE — Progress Notes (Addendum)
PT Cancellation Note  Patient Details Name: Ehtan Delfavero MRN: [id number] DOB: 10-10-1965   Cancelled Treatment:    Reason Eval/Treat Not Completed: Other (comment) (RN request hold- Pt halllucinating, pulling out IVs today. PT will follow up for eval as schedule allows.)   Festus Barren., PT, DPT  Acute Rehabilitation Services  Office [phone]  07/28/2022, 2:28 PM

## 2022-07-28 NOTE — Evaluation (Signed)
Occupational Therapy Evaluation Patient Details Name: Dylan Weaver MRN: [id number] DOB: 17-Jul-1966 Today's Date: 07/28/2022   History of Present Illness Pt is a 56 yo male admittd with cellulitis of buttocks and groin as well as acute alcohol w/d. Pt had similar admit in August 2023.   Clinical Impression   Pt admitted with the above diagnosis and has the deficits listed below. Pt would benefit from cont OT to increase independence with basic adls back to mod I level so he can return home with his mother.  Pt is currently requiring max assist with most adls but is in alcohol withdrawal.  Feel pt will improve but at this point in time if pt were to return home he would be unsafe and require SNF placement.  If pt improves and mother can assist feel pt could return home.  It should be noted that pt was admitted in Aug for same issues therefore home situation and set up is not a place that will support a safe d/c. Will continue to see acutely to reach baseline adl status.      Recommendations for follow up therapy are one component of a multi-disciplinary discharge planning process, led by the attending physician.  Recommendations may be updated based on patient status, additional functional criteria and insurance authorization.   Follow Up Recommendations  Skilled nursing-short term rehab (<3 hours/day)    Assistance Recommended at Discharge Frequent or constant Supervision/Assistance  Patient can return home with the following A lot of help with walking and/or transfers;A lot of help with bathing/dressing/bathroom;Assistance with cooking/housework;Direct supervision/assist for medications management;Direct supervision/assist for financial management;Assist for transportation;Help with stairs or ramp for entrance    Functional Status Assessment  Patient has had a recent decline in their functional status and demonstrates the ability to make significant improvements in function in a  reasonable and predictable amount of time.  Equipment Recommendations  Other (comment) (tbd)    Recommendations for Other Services       Precautions / Restrictions Precautions Precautions: Fall Restrictions Weight Bearing Restrictions: No      Mobility Bed Mobility Overal bed mobility: Needs Assistance Bed Mobility: Supine to Sit     Supine to sit: Mod assist     General bed mobility comments: bed flat, cues to use bedrails    Transfers Overall transfer level: Needs assistance Equipment used: 1 person hand held assist Transfers: Sit to/from Stand, Bed to chair/wheelchair/BSC Sit to Stand: Max assist   Squat pivot transfers: Mod assist       General transfer comment: Pt unable to achieve full standing due to weakness so squat pivot performed.      Balance Overall balance assessment: History of Falls, Needs assistance Sitting-balance support: Feet supported, Bilateral upper extremity supported Sitting balance-Leahy Scale: Poor Sitting balance - Comments: Pt fell over x2 on bed when statically sitting. Postural control: Right lateral lean Standing balance support: Bilateral upper extremity supported, Reliant on assistive device for balance, During functional activity Standing balance-Leahy Scale: Poor Standing balance comment: Pt did not achieve full standing.                           ADL either performed or assessed with clinical judgement   ADL Overall ADL's : Needs assistance/impaired Eating/Feeding: Minimal assistance;Sitting Eating/Feeding Details (indicate cue type and reason): Pt unable to order meal on phone Grooming: Wash/dry hands;Wash/dry face;Oral care;Minimal assistance;Sitting Grooming Details (indicate cue type and reason): cues for sequencing each step  Upper Body Bathing: Minimal assistance;Sitting   Lower Body Bathing: Maximal assistance;Sit to/from stand;Cueing for compensatory techniques   Upper Body Dressing : Sitting;Minimal  assistance;Cueing for compensatory techniques   Lower Body Dressing: Maximal assistance;Sit to/from stand;+2 for physical assistance;Cueing for compensatory techniques Lower Body Dressing Details (indicate cue type and reason): Pt requires a great amount of assist of one to stand and a second person to manage clothing Toilet Transfer: Moderate assistance;Squat-pivot;BSC/3in1   Toileting- Clothing Manipulation and Hygiene: Total assistance;Sit to/from stand;Cueing for compensatory techniques       Functional mobility during ADLs: Maximal assistance General ADL Comments: Pt very limited with all adls due to poor sitting balance and inability to maintain standing during eval for more than a brief second.     Vision Baseline Vision/History: 1 Wears glasses Ability to See in Adequate Light: 0 Adequate Patient Visual Report: No change from baseline Vision Assessment?: No apparent visual deficits Additional Comments: difficult to accurately assess     Perception     Praxis      Pertinent Vitals/Pain Pain Assessment Pain Assessment: Faces Faces Pain Scale: Hurts little more Pain Location: back and bottom Pain Descriptors / Indicators: Sore Pain Intervention(s): Limited activity within patient's tolerance, Monitored during session, Repositioned     Hand Dominance Right   Extremity/Trunk Assessment Upper Extremity Assessment Upper Extremity Assessment: Generalized weakness   Lower Extremity Assessment Lower Extremity Assessment: Defer to PT evaluation   Cervical / Trunk Assessment Cervical / Trunk Assessment: Kyphotic   Communication Communication Communication: No difficulties   Cognition Arousal/Alertness: Awake/alert Behavior During Therapy: Restless Overall Cognitive Status: Impaired/Different from baseline Area of Impairment: Orientation, Attention, Memory, Safety/judgement, Awareness, Problem solving                 Orientation Level: Disoriented to, Place,  Situation Current Attention Level: Focused Memory: Decreased recall of precautions, Decreased short-term memory   Safety/Judgement: Decreased awareness of safety, Decreased awareness of deficits Awareness: Intellectual Problem Solving: Slow processing, Decreased initiation, Difficulty sequencing, Requires verbal cues General Comments: Pt in alcohol withdrawel. Pt hallucinating and confabulating during eval.     General Comments  Pt very limited by cognition and poor balance/strength and hallucinations    Exercises     Shoulder Instructions      Home Living Family/patient expects to be discharged to:: Private residence Living Arrangements: Parent Available Help at Discharge: Family;Available PRN/intermittently Type of Home: House Home Access: Stairs to enter [employer] of Steps: 3-4 Entrance Stairs-Rails: Right;Left Home Layout: One level     Bathroom Shower/Tub: Producer, television/film/video: Standard Bathroom Accessibility: Yes   Home Equipment: Agricultural consultant (2 wheels);Cane - single point   Additional Comments: RW and SPC are pt's mom's, but he can use if needed      Prior Functioning/Environment Prior Level of Function : Independent/Modified Independent             Mobility Comments: unsure how pt walking PTA due to confusion ADLs Comments: Pt states he was independent with adls. Mom does most of cooking and cleaning. Unsure of accuracy of history due to confusion.  What pt told me of home set up different than what was in chart from previous admit.        OT Problem List: Decreased strength;Impaired balance (sitting and/or standing);Decreased cognition;Decreased safety awareness;Decreased knowledge of use of DME or AE;Decreased knowledge of precautions;Pain      OT Treatment/Interventions: Self-care/ADL training;DME and/or AE instruction;Therapeutic activities;Balance training    OT Goals(Current  goals can be found in the care plan section)  Acute Rehab OT Goals Patient Stated Goal: none stated OT Goal Formulation: With patient Time For Goal Achievement: 08/11/22 Potential to Achieve Goals: Fair ADL Goals Pt Will Perform Grooming: with supervision;standing Pt Will Perform Lower Body Bathing: with supervision;sit to/from stand Pt Will Perform Lower Body Dressing: with supervision;sit to/from stand Additional ADL Goal #1: Pt will walk to bathroom with walker and complete all toileting tasks with min guard. Additional ADL Goal #2: Pt will answer orientation questions with 100 % accuracy.  OT Frequency: Min 2X/week    Co-evaluation              AM-PAC OT "6 Clicks" Daily Activity     Outcome Measure Help from another person eating meals?: A Little Help from another person taking care of personal grooming?: A Little Help from another person toileting, which includes using toliet, bedpan, or urinal?: A Lot Help from another person bathing (including washing, rinsing, drying)?: A Lot Help from another person to put on and taking off regular upper body clothing?: A Little Help from another person to put on and taking off regular lower body clothing?: A Lot 6 Click Score: 15   End of Session Equipment Utilized During Treatment: Gait belt Nurse Communication: Mobility status  Activity Tolerance: Patient tolerated treatment well Patient left: in chair;with call bell/phone within reach;with chair alarm set  OT Visit Diagnosis: Unsteadiness on feet (R26.81)                Time: 3845-3646 OT Time Calculation (min): 32 min Charges:  OT General Charges $OT Visit: 1 Visit OT Evaluation $OT Eval Moderate Complexity: 1 Mod OT Treatments $Self Care/Home Management : 8-22 mins  Glenford Peers 07/28/2022, 8:31 AM

## 2022-07-28 NOTE — Progress Notes (Signed)
Initial Nutrition Assessment  DOCUMENTATION CODES:   Non-severe (moderate) malnutrition in context of social or environmental circumstances  INTERVENTION:   -Ensure Plus High Protein po BID, each supplement provides 350 kcal and 20 grams of protein.   -Multivitamin with minerals daily  -1 packet Juven BID, each packet provides 95 calories, 2.5 grams of protein (collagen), and 9.8 grams of carbohydrate (3 grams sugar); also contains 7 grams of L-arginine and L-glutamine, 300 mg vitamin C, 15 mg vitamin E, 1.2 mcg vitamin B-12, 9.5 mg zinc, 200 mg calcium, and 1.5 g  Calcium Beta-hydroxy-Beta-methylbutyrate to support wound healing    NUTRITION DIAGNOSIS:   Moderate Malnutrition related to social / environmental circumstances (alcohol abuse) as evidenced by mild fat depletion, moderate muscle depletion.  GOAL:   Patient will meet greater than or equal to 90% of their needs  MONITOR:   PO intake, Supplement acceptance, Labs, Weight trends, I & O's, Skin  REASON FOR ASSESSMENT:   Consult Assessment of nutrition requirement/status  ASSESSMENT:   56 y.o. male with medical history significant of  Alcohol abuse, history of Enterococcus bacteremia and ulcers in the past, Admitted for Alcohol withdrawal syndrome with perceptual disturbance,  Cellulitis of buttock and Hypokalemia.  Patient sleeping, just had echo completed. Did not wake up during visit. Noted that pt was given ativan this morning.  On regular diet, no PO documented yet.  Will order Ensure and Juven supplements to aid in wound healing. Will also add daily MVI given likely poor PO intakes PTA given continued alcohol abuse. Per chart review, pt was drinking 4-12 beers daily. Noted MD is checking thiamine labs.  Per weight records, no weight loss noted.  Medications: Folic acid, Multivitamin with minerals daily, Thiamine, NaCl w/ KCl, IV Mg sulfate, IV KCl  Labs reviewed: Low Na  NUTRITION - FOCUSED PHYSICAL  EXAM:  Flowsheet Row Most Recent Value  Orbital Region No depletion  Upper Arm Region Moderate depletion  Thoracic and Lumbar Region Unable to assess  Buccal Region Mild depletion  Temple Region Mild depletion  Clavicle Bone Region Mild depletion  Clavicle and Acromion Bone Region Moderate depletion  Scapular Bone Region Moderate depletion  Dorsal Hand No depletion  [swelling]  Patellar Region Moderate depletion  [redness]  Anterior Thigh Region Moderate depletion  Posterior Calf Region Moderate depletion  Edema (RD Assessment) None  Hair Reviewed  [thin]  Eyes Unable to assess  [kept eyes closed]  Mouth Reviewed  [poor dentition]  Skin Reviewed  Nails Reviewed  [long, dirt under nails]       Diet Order:   Diet Order             Diet regular Room service appropriate? Yes; Fluid consistency: Thin  Diet effective now                   EDUCATION NEEDS:   Not appropriate for education at this time  Skin:  Skin Assessment: Skin Integrity Issues: Skin Integrity Issues:: Other (Comment) Other: cellulitis of right buttocks, groin  Last BM:  PTA  Height:   Ht Readings from Last 1 Encounters:  07/27/22 6' (1.829 m)    Weight:   Wt Readings from Last 1 Encounters:  07/27/22 81.6 kg    BMI:  Body mass index is 24.41 kg/m.  Estimated Nutritional Needs:   Kcal:  2400-2600  Protein:  120-130g  Fluid:  2.4L/day  Clayton Bibles, MS, RD, LDN Inpatient Clinical Dietitian Contact information available via Amion

## 2022-07-28 NOTE — Progress Notes (Signed)
Patient's Blood Pressure soft 90/53-98/62. No other acute changes noted at this time with Patient's assessment. New orders to be placed. Maintain current plan of care for this Patient

## 2022-07-28 NOTE — Progress Notes (Signed)
Dylan Weaver  KWI:[id number] DOB: 1966/09/08 DOA: 07/27/2022 PCP: [facility]    Brief Narrative:  56 year old with a history of alcohol abuse who presented to the ED with complaint of pain in his buttocks but was found to be acutely confused and an apparent acute alcohol withdrawal after having abstained for 3 days.  Upon arrival he was covered in feces and quite disheveled.  CT pelvis in the ER revealed no evidence of abscess or free air within the soft tissues of the buttocks.  CT head was unrevealing.  Of note the patient had a nearly identical hospitalization in August of this year.  At that time attempts were made to place him within SNF but there were no bed offers.  She  Consultants:  None  Goals of Care:  Code Status: Full Code   DVT prophylaxis: SCDs  Interim Hx: Afebrile since admission.  Vital signs stable.  Saturation 98% room air.  Sedated/resting comfortably at the time of visit.  In no acute distress.  Respirations unlabored.  Assessment & Plan:  Acute alcohol withdrawal with delirium - alcoholism Continue CIWA protocol -well-controlled with Ativan alone at present  Mild transaminitis pattern consistent with alcohol abuse -improving in absence of alcohol  Cellulitis of the buttocks/perineum versus simple irritant contact dermatitis Appears to primarily be due to very poor hygiene with skin encrusted with fecal matter at time of presentation - discontinue antibiotic for now and follow as I suspect this will improve rapidly with simple hygiene/topical care   Hyponatremia Likely due to a combination of volume depletion with possible chronic component due to alcoholism -follow trend  Hypokalemia Likely due to very poor intake given alcoholism and therefore likely require multiple doses to correct -supplement and also follow magnesium and phosphorus  Normocytic anemia Likely simply due to bone marrow suppressive effects of chronic high-dose  alcohol use -monitor trend  Disposition: Unclear until alcohol withdrawal has resolved  Objective: Blood pressure (!) 92/59, pulse 81, temperature 97.7 F (36.5 C), temperature source Oral, resp. rate 19, height 6' (1.829 m), weight 81.6 kg, SpO2 98 %.  Intake/Output Summary (Last 24 hours) at 07/28/2022 0847 Last data filed at 07/28/2022 2426 Gross per 24 hour  Intake 1243.59 ml  Output 500 ml  Net 743.59 ml   Filed Weights   07/27/22 1430  Weight: 81.6 kg    Examination: General: No acute respiratory distress Lungs: Clear to auscultation bilaterally without wheezes or crackles Cardiovascular: Regular rate and rhythm without murmur gallop or rub normal S1 and S2 Abdomen: Nontender, nondistended, soft, bowel sounds positive, no rebound, no ascites, no appreciable mass Extremities: No significant cyanosis, clubbing, or edema bilateral lower extremities  CBC: Recent Labs  Lab 07/27/22 1550 07/28/22 0527  WBC 10.8* 8.2  NEUTROABS 8.5*  --   HGB 11.2* 9.2*  HCT 32.4* 26.9*  MCV 91.3 92.4  PLT 249 834   Basic Metabolic Panel: Recent Labs  Lab 07/27/22 1550 07/27/22 2158 07/28/22 0527  NA 128*  --  128*  K 3.1*  --  3.6  CL 87*  --  93*  CO2 27  --  25  GLUCOSE 84  --  74  BUN 8  --  8  CREATININE 0.52*  --  0.51*  CALCIUM 8.8*  --  8.5*  MG  --  1.7 2.0  PHOS  --  3.2 2.6   GFR: Estimated Creatinine Clearance: 113.2 mL/min (A) (by C-G formula based on SCr of 0.51 mg/dL (L)).  Scheduled Meds:  folic acid  1 mg Oral Daily   LORazepam  0-4 mg Intravenous Q6H   Followed by   Melene Muller ON 07/29/2022] LORazepam  0-4 mg Intravenous Q12H   multivitamin with minerals  1 tablet Oral Daily   thiamine  100 mg Oral Daily   Or   thiamine  100 mg Intravenous Daily   Continuous Infusions:  sodium chloride 125 mL/hr at 07/27/22 2211   ceFEPime (MAXIPIME) IV 2 g (07/28/22 0517)   vancomycin 1,500 mg (07/28/22 0141)     LOS: 1 day   Lonia Blood, MD [facility] Office  [phone] Pager - Text Page per Loretha Stapler  If 7PM-7AM, please contact night-coverage per Amion 07/28/2022, 8:47 AM

## 2022-07-28 NOTE — Progress Notes (Signed)
*  PRELIMINARY RESULTS* Echocardiogram 2D Echocardiogram has been performed.  Dylan Weaver 07/28/2022, 10:40 AM

## 2022-07-28 NOTE — TOC Initial Note (Signed)
Transition of Care [facility]) - Initial/Assessment Note    Patient Details  Name: Dylan Weaver MRN: [id number] Date of Birth: 01/02/1966  [facility] CM/SW Contact:    Lanier Clam, RN Phone Number: 07/28/2022, 2:26 PM  Clinical Narrative: Spoke to patient, & primary contact Jennifer(Aunt) about d/c plans-home. Patient decline rehab-want home/agree to etoh resources-will add to d/c instructions.Active w/[facility] HHPT/OT rep Morrie Sheldon following-Noted WOC wound care-HHRN to be added-[facility] rep Morrie Sheldon says she can get own [place]. Jennifer(Aunt) states family will have own transport home.                  Expected Discharge Plan: Home w Home Health Services Barriers to Discharge: Continued Medical Work up   Patient Goals and CMS Choice Patient states their goals for this hospitalization and ongoing recovery are::  (Home) CMS Medicare.gov Compare Post Acute Care list provided to:: Patient Choice offered to / list presented to : Patient  Expected Discharge Plan and Services Expected Discharge Plan: Home w Home Health Services   Discharge Planning Services: CM Consult Post Acute Care Choice: Home Health Living arrangements for the past 2 months: Single Family Home                             HH Agency: [facility] Date [facility] Agency Contacted: 07/28/22 Time HH Agency Contacted: 1425 Representative spoke with at [facility] Agency:  Morrie Sheldon)  Prior Living Arrangements/Services Living arrangements for the past 2 months: Single Family Home Lives with:: Self Patient language and need for interpreter reviewed:: Yes Do you feel safe going back to the place where you live?: Yes      Need for Family Participation in Patient Care: Yes (Comment) Care giver support system in place?: Yes (comment) Current home services: DME, Home PT, Home OT (rw,w/c;Active w/[facility] HHPT/OT) Criminal Activity/Legal Involvement Pertinent to Current Situation/Hospitalization:  No - Comment as needed  Activities of Daily Living Home Assistive Devices/Equipment: Environmental consultant (specify type), Eyeglasses (reading glasses) ADL Screening (condition at time of admission) Patient's cognitive ability adequate to safely complete daily activities?: No Is the patient deaf or have difficulty hearing?: No Does the patient have difficulty seeing, even when wearing glasses/contacts?: No Does the patient have difficulty concentrating, remembering, or making decisions?: Yes Patient able to express need for assistance with ADLs?: Yes Does the patient have difficulty dressing or bathing?: No Independently performs ADLs?: Yes (appropriate for developmental age) Does the patient have difficulty walking or climbing stairs?: No Weakness of Legs: None Weakness of Arms/Hands: None  Permission Sought/Granted Permission sought to share information with : Case Manager Permission granted to share information with : Yes, Verbal Permission Granted  Share Information with NAME:  (Case manager)           Emotional Assessment Appearance:: Appears stated age Attitude/Demeanor/Rapport: Gracious Affect (typically observed): Accepting Orientation: : Oriented to Self, Oriented to Place, Oriented to  Time Alcohol / Substance Use: Alcohol Use Psych Involvement: No (comment)  Admission diagnosis:  Cellulitis of buttock [L03.317] Hypokalemia [E87.6] Cellulitis [L03.90] Alcohol withdrawal syndrome with perceptual disturbance [facility]) [F10.932] Patient Active Problem List   Diagnosis Date Noted   Cellulitis 07/27/2022   Hypophosphatemia 04/29/2022   Enteropathogenic Escherichia coli infection 04/27/2022   Hypokalemia 04/25/2022   Aspiration pneumonia (HCC) 04/25/2022   Debility 11/03/2021   Acute metabolic encephalopathy 11/02/2021   Enterococcal bacteremia    Pressure ulcer 10/24/2021   Irritant contact dermatitis due to incontinence  of both feces and urine 10/22/2021   Rhabdomyolysis 10/22/2021    Sepsis with acute organ dysfunction without septic shock due to bilateral thigh/pubic area cellulitis and enterococcal bacteremia 10/22/2021   Hypomagnesemia 08/31/2021   Hepatic steatosis 08/31/2021   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 breakdown of skin ([place])    Tobacco abuse 08/26/2021   Infection of right foot with necrosis 08/26/2021   Foot infection 08/26/2021   Hyponatremia    Decompensated hepatic cirrhosis ([place]) 08/25/2021   Protein-calorie malnutrition, severe 06/14/2021   Elevated LFTs 11/29/2020   Intertrigo 11/29/2020   QT prolongation 11/29/2020   Malnutrition of moderate degree 09/16/2020   Alcohol withdrawal ([place]) 88/89/1694   Alcoholic hepatitis 50/38/8828   Thrombocytopenia ([name]) 09/14/2020   Normochromic normocytic anemia 09/14/2020   Alcohol use disorder with alcohol withdrawal  06/19/2020   Alcohol abuse with intoxication ([place]) 06/19/2020   PCP:  Clinic, [place]:   [id number] - [place], [place] - [address] [place] [address] Phone: [phone] Fax: [place] [address] Phone: [phone] Fax: [phone]  [facility] [address] [place] [address] Phone: [phone] Fax: [phone]     Social Determinants of Health (SDOH) Interventions    Readmission Risk Interventions     No data to display

## 2022-07-28 NOTE — Progress Notes (Signed)
MD has been updated Patient with Male purewick in place however no urinary output this shift. MD to place new orders. Maintain current plan of care for this Patient.

## 2022-07-29 ENCOUNTER — Other Ambulatory Visit: Payer: Self-pay

## 2022-07-29 DIAGNOSIS — F10932 Alcohol use, unspecified with withdrawal with perceptual disturbance: Secondary | ICD-10-CM | POA: Diagnosis not present

## 2022-07-29 DIAGNOSIS — E876 Hypokalemia: Secondary | ICD-10-CM | POA: Diagnosis not present

## 2022-07-29 DIAGNOSIS — F1020 Alcohol dependence, uncomplicated: Secondary | ICD-10-CM | POA: Diagnosis not present

## 2022-07-29 DIAGNOSIS — L03317 Cellulitis of buttock: Secondary | ICD-10-CM | POA: Diagnosis not present

## 2022-07-29 LAB — CBC
HCT: 29.4 % — ABNORMAL LOW (ref 39.0–52.0)
Hemoglobin: 9.7 g/dL — ABNORMAL LOW (ref 13.0–17.0)
MCH: 31.7 pg (ref 26.0–34.0)
MCHC: 33 g/dL (ref 30.0–36.0)
MCV: 96.1 fL (ref 80.0–100.0)
Platelets: 284 10*3/uL (ref 150–400)
RBC: 3.06 MIL/uL — ABNORMAL LOW (ref 4.22–5.81)
RDW: 15.2 % (ref 11.5–15.5)
WBC: 9.5 10*3/uL (ref 4.0–10.5)
nRBC: 0 % (ref 0.0–0.2)

## 2022-07-29 LAB — COMPREHENSIVE METABOLIC PANEL
ALT: 44 U/L (ref 0–44)
AST: 74 U/L — ABNORMAL HIGH (ref 15–41)
Albumin: 2.4 g/dL — ABNORMAL LOW (ref 3.5–5.0)
Alkaline Phosphatase: 147 U/L — ABNORMAL HIGH (ref 38–126)
Anion gap: 6 (ref 5–15)
BUN: 6 mg/dL (ref 6–20)
CO2: 23 mmol/L (ref 22–32)
Calcium: 8.4 mg/dL — ABNORMAL LOW (ref 8.9–10.3)
Chloride: 104 mmol/L (ref 98–111)
Creatinine, Ser: 0.48 mg/dL — ABNORMAL LOW (ref 0.61–1.24)
GFR, Estimated: >60 mL/min (ref >60)
Glucose, Bld: 97 mg/dL (ref 70–99)
Potassium: 4.4 mmol/L (ref 3.5–5.1)
Sodium: 133 mmol/L — ABNORMAL LOW (ref 135–145)
Total Bilirubin: 1 mg/dL (ref 0.3–1.2)
Total Protein: 6.6 g/dL (ref 6.5–8.1)

## 2022-07-29 LAB — PHOSPHORUS: Phosphorus: 2.5 mg/dL (ref 2.5–4.6)

## 2022-07-29 LAB — URINE CULTURE: Culture: <10000 — AB

## 2022-07-29 LAB — MAGNESIUM: Magnesium: 1.7 mg/dL (ref 1.7–2.4)

## 2022-07-29 NOTE — Evaluation (Signed)
Physical Therapy Evaluation Patient Details Name: Dylan Weaver MRN: [id number] DOB: [birth year] Today's Date: 07/29/2022  History of Present Illness  Pt is a 56 yo male admittd with cellulitis of buttocks and groin as well as acute alcohol w/d. Pt had similar admit in August 2023.   Clinical Impression  Pt admitted with above diagnosis and currently with functional limitations due to the deficits listed below (see PT Problem List).  Pt confused and unsure if home environment information is accurate. Pt required min assist for bed mobility, min assist +2 for sit to stand transfer, mod assist +2 for step pivot transfer. Pt with generalized weakness of BLE and unable to stand without assistance so ambulation not safe at present. Pt will benefit from skilled PT to increase their independence and safety with mobility to allow discharge to the venue listed below.         Recommendations for follow up therapy are one component of a multi-disciplinary discharge planning process, led by the attending physician.  Recommendations may be updated based on patient status, additional functional criteria and insurance authorization.  Follow Up Recommendations Skilled nursing-short term rehab (<3 hours/day) Can patient physically be transported by private vehicle: No    Assistance Recommended at Discharge Frequent or constant Supervision/Assistance  Patient can return home with the following  Help with stairs or ramp for entrance;Assist for transportation;Assistance with cooking/housework;A lot of help with bathing/dressing/bathroom;Two people to help with walking and/or transfers    Equipment Recommendations None recommended by PT (TBD)  Recommendations for Other Services       Functional Status Assessment Patient has had a recent decline in their functional status and demonstrates the ability to make significant improvements in function in a reasonable and predictable amount of time.      Precautions / Restrictions Precautions Precautions: Fall Restrictions Weight Bearing Restrictions: No      Mobility  Bed Mobility Overal bed mobility: Needs Assistance Bed Mobility: Supine to Sit     Supine to sit: Min assist     General bed mobility comments: Pt with increased time, able to come to sitting with rest breaks, started with heavy use of RUE to support on bed but was able to sit without use of hands eventually.    Transfers Overall transfer level: Needs assistance Equipment used: Rolling walker (2 wheels) Transfers: Sit to/from Stand, Bed to chair/wheelchair/BSC Sit to Stand: +2 safety/equipment, Min assist, +2 physical assistance   Step pivot transfers: Mod assist, +2 safety/equipment, +2 physical assistance       General transfer comment: Pt unable to acheive full standing without assistance. Required min assist +2 for lift assistance and unable to stand fully upright, using backs of legs on bed to stay standing, b/l knees flexed in stance. Became fatigued so pt sat down for ~20min to rest. Second attempt the same. Step pivot: Mod assist +2 for remaining upright secondary to weakness as well as AD management as pt seemed to be confused or unfamiliar with AD; pt attempted to sit prior to reaching recliner surface, required mod assist +2 to remain standing until controlled lowering.    Ambulation/Gait               General Gait Details: unsafe at present.  Stairs            Wheelchair Mobility    Modified Rankin (Stroke Patients Only)       Balance Overall balance assessment: History of Falls, Needs assistance Sitting-balance support: Feet supported, Single  extremity supported Sitting balance-Leahy Scale: Fair   Postural control: Right lateral lean Standing balance support: Bilateral upper extremity supported, Reliant on assistive device for balance, During functional activity Standing balance-Leahy Scale: Poor Standing balance comment: Pt  did not achieve full standing without assist                             Pertinent Vitals/Pain Pain Assessment Pain Assessment: No/denies pain    Home Living Family/patient expects to be discharged to:: Private residence Living Arrangements: Parent Available Help at Discharge: Family;Available PRN/intermittently Type of Home: House Home Access: Stairs to enter Entrance Stairs-Rails: Right;Left Entrance Stairs-Number of Steps: 3-4   Home Layout: One level Home Equipment: Conservation officer, nature (2 wheels);Cane - single point Additional Comments: RW and SPC are pt's mom's, but he can use if needed. Unsure of accuracy of home environment.    Prior Function Prior Level of Function : Independent/Modified Independent             Mobility Comments: Pt reports independence but is confused. ADLs Comments: Pt states he was independent with adls. Mom does most of cooking and cleaning. Unsure of accuracy of history due to confusion.  What pt told me of home set up different than what was in chart from previous admit.     Hand Dominance   Dominant Hand: Right    Extremity/Trunk Assessment   Upper Extremity Assessment Upper Extremity Assessment: Defer to OT evaluation    Lower Extremity Assessment Lower Extremity Assessment: Generalized weakness    Cervical / Trunk Assessment Cervical / Trunk Assessment: Kyphotic  Communication   Communication: No difficulties  Cognition Arousal/Alertness: Awake/alert Behavior During Therapy: Restless Overall Cognitive Status: Impaired/Different from baseline Area of Impairment: Orientation, Attention, Memory, Safety/judgement, Awareness, Problem solving                 Orientation Level: Disoriented to, Place, Situation Current Attention Level: Focused Memory: Decreased recall of precautions, Decreased short-term memory   Safety/Judgement: Decreased awareness of safety, Decreased awareness of deficits Awareness:  Intellectual Problem Solving: Slow processing, Decreased initiation, Difficulty sequencing, Requires verbal cues          General Comments      Exercises General Exercises - Lower Extremity Ankle Circles/Pumps: AROM, Both, 5 reps Straight Leg Raises: AROM, Both, Other reps (comment) (3)   Assessment/Plan    PT Assessment Patient needs continued PT services  PT Problem List Decreased strength;Decreased range of motion;Decreased activity tolerance;Decreased balance;Decreased mobility;Decreased coordination;Decreased cognition;Decreased knowledge of use of DME;Decreased safety awareness;Pain       PT Treatment Interventions DME instruction;Gait training;Stair training;Functional mobility training;Therapeutic activities;Therapeutic exercise;Balance training;Neuromuscular re-education;Patient/family education;Cognitive remediation    PT Goals (Current goals can be found in the Care Plan section)  Acute Rehab PT Goals Patient Stated Goal: none stated PT Goal Formulation: With patient Time For Goal Achievement: 08/12/22 Potential to Achieve Goals: Fair    Frequency 7X/week     Co-evaluation               AM-PAC PT "6 Clicks" Mobility  Outcome Measure Help needed turning from your back to your side while in a flat bed without using bedrails?: A Little Help needed moving from lying on your back to sitting on the side of a flat bed without using bedrails?: A Little Help needed moving to and from a bed to a chair (including a wheelchair)?: A Lot Help needed standing up from a chair using  your arms (e.g., wheelchair or bedside chair)?: A Lot Help needed to walk in hospital room?: A Lot Help needed climbing 3-5 steps with a railing? : A Lot 6 Click Score: 14    End of Session Equipment Utilized During Treatment: Gait belt Activity Tolerance: Patient limited by fatigue Patient left: in chair;with call bell/phone within reach;with chair alarm set Nurse Communication: Mobility  status PT Visit Diagnosis: Difficulty in walking, not elsewhere classified (R26.2);History of falling (Z91.81)    Time: 0175-1025 PT Time Calculation (min) (ACUTE ONLY): 15 min   Charges:   PT Evaluation $PT Eval Low Complexity: 1 Low          Jamesetta Geralds, PT, DPT [facility] Office: [phone] Weekend pager: [phone]   Jamesetta Geralds 07/29/2022, 12:09 PM

## 2022-07-29 NOTE — Progress Notes (Signed)
Patient's entire left arm noted to have 1-2+ edema. Radial pulse 2+. IV infusing and removed. Md notified and orders received.Eulas Post, RN

## 2022-07-29 NOTE — Progress Notes (Signed)
Dylan Weaver  JKD:[id number] DOB: Jul 28, 1966 DOA: 07/27/2022 PCP: [facility]    Brief Narrative:  55 year old with a history of alcohol abuse who presented to the ED with complaint of pain in his buttocks but was found to be acutely confused and an apparent acute alcohol withdrawal after having abstained for 3 days.  Upon arrival he was covered in feces and quite disheveled.  CT pelvis in the ER revealed no evidence of abscess or free air within the soft tissues of the buttocks.  CT head was unrevealing.  Of note the patient had a nearly identical hospitalization in August of this year.  At that time attempts were made to place him within SNF but there were no bed offers.  She  Consultants:  None  Goals of Care:  Code Status: Full Code   DVT prophylaxis: SCDs  Interim Hx: Afebrile.  Vital stable with blood pressure soft with systolics 09-983.  Electrolytes improving.  Much more alert today and conversant, but confused.  Can tell me where he is but not exactly why he is here.  Does admit to alcohol abuse.  Assessment & Plan:  Acute alcohol withdrawal with delirium - alcoholism Continue CIWA protocol - well-controlled with Ativan alone at present  Mild transaminitis pattern consistent with alcohol abuse - continues to improve and now nearly normalized in absence of alcohol  Cellulitis of the buttocks/perineum versus simple irritant contact dermatitis Appears to primarily be due to very poor hygiene with skin encrusted with fecal matter at time of presentation - discontinued antibiotic as I suspect this will improve rapidly with simple hygiene/topical care   Hyponatremia Likely due to a combination of volume depletion with possible chronic component due to alcoholism - improving with volume resuscitation in absence of alcohol  Hypokalemia Likely due to very poor intake given alcoholism - has normalized with supplementation  Normocytic anemia Likely simply due  to bone marrow suppressive effects of chronic high-dose alcohol use -hemoglobin stable presently  L arm edema  During the course of the day the patient has developed significant swelling th/o his entire L arm - will check for DVT (likely he was immobile for prolonged periods of time at home)  Disposition: Unclear until alcohol withdrawal has resolved - attempt to mobilize  Objective: Blood pressure 100/64, pulse 100, temperature 98.4 F (36.9 C), temperature source Oral, resp. rate 19, height 6' (1.829 m), weight 81.6 kg, SpO2 100 %.  Intake/Output Summary (Last 24 hours) at 07/29/2022 0918 Last data filed at 07/29/2022 0600 Gross per 24 hour  Intake 2151.7 ml  Output 700 ml  Net 1451.7 ml    Filed Weights   07/27/22 1430  Weight: 81.6 kg    Examination: General: No acute respiratory distress Lungs: Clear to auscultation bilaterally without wheezes or crackles Cardiovascular: Regular rate and rhythm without murmur  Abdomen: Nontender, nondistended, soft, bowel sounds positive, no rebound, no ascites, no appreciable mass Extremities: No significant edema bilateral lower extremities  CBC: Recent Labs  Lab 07/27/22 1550 07/28/22 0527 07/29/22 0519  WBC 10.8* 8.2 9.5  NEUTROABS 8.5*  --   --   HGB 11.2* 9.2* 9.7*  HCT 32.4* 26.9* 29.4*  MCV 91.3 92.4 96.1  PLT 249 257 382    Basic Metabolic Panel: Recent Labs  Lab 07/27/22 1550 07/27/22 2158 07/28/22 0527 07/29/22 0714  NA 128*  --  128* 133*  K 3.1*  --  3.6 4.4  CL 87*  --  93* 104  CO2  27  --  25 23  GLUCOSE 84  --  74 97  BUN 8  --  8 6  CREATININE 0.52*  --  0.51* 0.48*  CALCIUM 8.8*  --  8.5* 8.4*  MG  --  1.7 2.0 1.7  PHOS  --  3.2 2.6 2.5    GFR: Estimated Creatinine Clearance: 113.2 mL/min (A) (by C-G formula based on SCr of 0.48 mg/dL (L)).   Scheduled Meds:  feeding supplement  237 mL Oral BID BM   folic acid  1 mg Oral Daily   LORazepam  0-4 mg Intravenous Q4H   Followed by   Melene Muller ON  07/30/2022] LORazepam  0-4 mg Intravenous Q8H   multivitamin with minerals  1 tablet Oral Daily   nutrition supplement (JUVEN)  1 packet Oral BID WC   thiamine  100 mg Oral Daily   Continuous Infusions:  0.9 % NaCl with KCl 20 mEq / L 125 mL/hr at 07/29/22 0341     LOS: 2 days   Lonia Blood, MD [facility] Office  [phone] Pager - Text Page per Loretha Stapler  If 7PM-7AM, please contact night-coverage per Amion 07/29/2022, 9:18 AM

## 2022-07-30 ENCOUNTER — Inpatient Hospital Stay (HOSPITAL_COMMUNITY): Payer: No Typology Code available for payment source

## 2022-07-30 DIAGNOSIS — E871 Hypo-osmolality and hyponatremia: Secondary | ICD-10-CM | POA: Diagnosis not present

## 2022-07-30 DIAGNOSIS — M7989 Other specified soft tissue disorders: Secondary | ICD-10-CM

## 2022-07-30 DIAGNOSIS — F10932 Alcohol use, unspecified with withdrawal with perceptual disturbance: Secondary | ICD-10-CM | POA: Diagnosis not present

## 2022-07-30 DIAGNOSIS — E876 Hypokalemia: Secondary | ICD-10-CM | POA: Diagnosis not present

## 2022-07-30 LAB — CBC
HCT: 25.9 % — ABNORMAL LOW (ref 39.0–52.0)
Hemoglobin: 8.8 g/dL — ABNORMAL LOW (ref 13.0–17.0)
MCH: 31.9 pg (ref 26.0–34.0)
MCHC: 34 g/dL (ref 30.0–36.0)
MCV: 93.8 fL (ref 80.0–100.0)
Platelets: 282 10*3/uL (ref 150–400)
RBC: 2.76 MIL/uL — ABNORMAL LOW (ref 4.22–5.81)
RDW: 15.2 % (ref 11.5–15.5)
WBC: 10.7 10*3/uL — ABNORMAL HIGH (ref 4.0–10.5)
nRBC: 0 % (ref 0.0–0.2)

## 2022-07-30 LAB — COMPREHENSIVE METABOLIC PANEL
ALT: 43 U/L (ref 0–44)
AST: 64 U/L — ABNORMAL HIGH (ref 15–41)
Albumin: 2.5 g/dL — ABNORMAL LOW (ref 3.5–5.0)
Alkaline Phosphatase: 167 U/L — ABNORMAL HIGH (ref 38–126)
Anion gap: 5 (ref 5–15)
BUN: <5 mg/dL — ABNORMAL LOW (ref 6–20)
CO2: 21 mmol/L — ABNORMAL LOW (ref 22–32)
Calcium: 8.5 mg/dL — ABNORMAL LOW (ref 8.9–10.3)
Chloride: 101 mmol/L (ref 98–111)
Creatinine, Ser: 0.37 mg/dL — ABNORMAL LOW (ref 0.61–1.24)
GFR, Estimated: >60 mL/min (ref >60)
Glucose, Bld: 89 mg/dL (ref 70–99)
Potassium: 4.2 mmol/L (ref 3.5–5.1)
Sodium: 127 mmol/L — ABNORMAL LOW (ref 135–145)
Total Bilirubin: 1 mg/dL (ref 0.3–1.2)
Total Protein: 6.9 g/dL (ref 6.5–8.1)

## 2022-07-30 LAB — IRON AND TIBC
Iron: 40 ug/dL — ABNORMAL LOW (ref 45–182)
Saturation Ratios: 24 % (ref 17.9–39.5)
TIBC: 169 ug/dL — ABNORMAL LOW (ref 250–450)
UIBC: 129 ug/dL

## 2022-07-30 LAB — FOLATE: Folate: 9.9 ng/mL (ref >5.9)

## 2022-07-30 LAB — RETICULOCYTES
Immature Retic Fract: 25.8 % — ABNORMAL HIGH (ref 2.3–15.9)
RBC.: 2.79 MIL/uL — ABNORMAL LOW (ref 4.22–5.81)
Retic Count, Absolute: 80.6 10*3/uL (ref 19.0–186.0)
Retic Ct Pct: 2.9 % (ref 0.4–3.1)

## 2022-07-30 LAB — MAGNESIUM: Magnesium: 1.4 mg/dL — ABNORMAL LOW (ref 1.7–2.4)

## 2022-07-30 LAB — FERRITIN: Ferritin: 599 ng/mL — ABNORMAL HIGH (ref 24–336)

## 2022-07-30 LAB — VITAMIN B12: Vitamin B-12: 539 pg/mL (ref 180–914)

## 2022-07-30 LAB — PHOSPHORUS: Phosphorus: 2.6 mg/dL (ref 2.5–4.6)

## 2022-07-30 MED ORDER — MAGNESIUM SULFATE 4 GM/100ML IV SOLN
4.0000 g | Freq: Once | INTRAVENOUS | Status: AC
  Administered 2022-07-30: 4 g via INTRAVENOUS
  Filled 2022-07-30: qty 100

## 2022-07-30 NOTE — Progress Notes (Signed)
Left upper extremity venous duplex has been completed. Preliminary results can be found in CV Proc through chart review.   07/30/22 10:56 AM Dylan Weaver RVT

## 2022-07-30 NOTE — Progress Notes (Signed)
Dylan Weaver  HQI:[id number] DOB: 12-Feb-1966 DOA: 07/27/2022 [facility]CP: [facility]    Brief Narrative:  56 year old with a history of alcohol abuse who presented to the ED with complaint of pain in his buttocks but was found to be acutely confused and an apparent acute alcohol withdrawal after having abstained for 3 days.  Upon arrival he was covered in feces and quite disheveled.  CT pelvis in the ER revealed no evidence of abscess or free air within the soft tissues of the buttocks.  CT head was unrevealing.  Of note the patient had a nearly identical hospitalization in August of this year.  At that time attempts were made to place him within SNF but there were no bed offers.  She  Consultants:  None  Goals of Care:  Code Status: Full Code   DVT prophylaxis: SCDs  Interim Hx: Afebrile.  Vital signs stable.  Much more alert and conversant.  Has been cleaned up and shaved by the nurse tech with dramatic improvement in comfort and appearance.  Is in good spirits.  Remains mildly confused but more oriented than yesterday.  Has no new complaints.  Assessment & Plan:  Acute alcohol withdrawal with delirium - alcoholism Doing well with CIWA protocol with delirium improving and agitation well controlled  Mild transaminitis pattern consistent with alcohol abuse - now essentially normalized in absence of alcohol  Severe irritant contact dermatitis of the buttocks/perineum - cellulitis ruled out  Appears to primarily be due to very poor hygiene with skin encrusted with fecal matter at time of presentation - discontinued antibiotic as I suspect this will improve rapidly with simple hygiene/topical care -inspected today with overall improvement in the erythema and no evidence of frank infection at present  Hyponatremia Likely due to a combination of volume depletion with possible chronic component due to alcoholism - stable at this time -monitor  intermittently  Hypokalemia Likely due to very poor intake/nutrition due to alcoholism- monitor    Hypomagnesemia Supplement and follow  Normocytic anemia Likely simply due to bone marrow suppressive effects of chronic high-dose alcohol use - hemoglobin stable presently -iron studies consistent with anemia of chronic disease/poor nutrition - X32 and folic acid not low  L arm edema  During the course of the day 10/28 the pt developed significant swelling th/o his entire L arm - venous duplex ruled out DVT -likely simply due to infiltration of IV -nearly resolved on exam today with discontinuation of IV  Disposition: PT recommending SNF for rehab stay  Objective: Blood pressure 113/83, pulse 91, temperature 99.1 F (37.3 C), temperature source Oral, resp. rate 18, height 6' (1.829 m), weight 81.6 kg, SpO2 100 %.  Intake/Output Summary (Last 24 hours) at 07/30/2022 0905 Last data filed at 07/30/2022 0600 Gross per 24 hour  Intake 2679.09 ml  Output 2100 ml  Net 579.09 ml    Filed Weights   07/27/22 1430  Weight: 81.6 kg    Examination: General: No acute respiratory distress Lungs: Clear to auscultation bilaterally - no wheezing  Cardiovascular: Regular rate and rhythm -no murmur Abdomen: Nontender, nondistended, soft, bowel sounds positive, no rebound Extremities: No significant edema bilateral lower extremities Cutaneous: Patient rolled into the left lateral decubitus position with help from the nurse tach noting some mild persisting erythema of the gluteal cleft but no skin breakdown immediately of the sacrum with no clear evidence of cellulitis -above the sacrum in the area of the lumbar and low thoracic spine there is stage  II/superficial breakdown of the skin but no suggestion of abscess formation or purulent discharge  CBC: Recent Labs  Lab 07/27/22 1550 07/28/22 0527 07/29/22 0519 07/30/22 0618  WBC 10.8* 8.2 9.5 10.7*  NEUTROABS 8.5*  --   --   --   HGB 11.2*  9.2* 9.7* 8.8*  HCT 32.4* 26.9* 29.4* 25.9*  MCV 91.3 92.4 96.1 93.8  PLT 249 257 284 282    Basic Metabolic Panel: Recent Labs  Lab 07/28/22 0527 07/29/22 0714 07/30/22 0618  NA 128* 133* 127*  K 3.6 4.4 4.2  CL 93* 104 101  CO2 25 23 21*  GLUCOSE 74 97 89  BUN 8 6 <5*  CREATININE 0.51* 0.48* 0.37*  CALCIUM 8.5* 8.4* 8.5*  MG 2.0 1.7 1.4*  PHOS 2.6 2.5 2.6    GFR: Estimated Creatinine Clearance: 113.2 mL/min (A) (by C-G formula based on SCr of 0.37 mg/dL (L)).   Scheduled Meds:  feeding supplement  237 mL Oral BID BM   folic acid  1 mg Oral Daily   LORazepam  0-4 mg Intravenous Q4H   Followed by   LORazepam  0-4 mg Intravenous Q8H   multivitamin with minerals  1 tablet Oral Daily   nutrition supplement (JUVEN)  1 packet Oral BID WC   thiamine  100 mg Oral Daily   Continuous Infusions:  0.9 % NaCl with KCl 20 mEq / L 100 mL/hr at 07/30/22 0600     LOS: 3 days   Lonia Blood, MD [facility] Office  [phone] Pager - Text Page per Loretha Stapler  If 7PM-7AM, please contact night-coverage per Amion 07/30/2022, 9:05 AM

## 2022-07-31 LAB — CBC
HCT: 31.6 % — ABNORMAL LOW (ref 39.0–52.0)
Hemoglobin: 10.7 g/dL — ABNORMAL LOW (ref 13.0–17.0)
MCH: 31.8 pg (ref 26.0–34.0)
MCHC: 33.9 g/dL (ref 30.0–36.0)
MCV: 94 fL (ref 80.0–100.0)
Platelets: 223 10*3/uL (ref 150–400)
RBC: 3.36 MIL/uL — ABNORMAL LOW (ref 4.22–5.81)
RDW: 15.5 % (ref 11.5–15.5)
WBC: 10.2 10*3/uL (ref 4.0–10.5)
nRBC: 0 % (ref 0.0–0.2)

## 2022-07-31 LAB — BASIC METABOLIC PANEL
Anion gap: 6 (ref 5–15)
BUN: 7 mg/dL (ref 6–20)
CO2: 21 mmol/L — ABNORMAL LOW (ref 22–32)
Calcium: 8.6 mg/dL — ABNORMAL LOW (ref 8.9–10.3)
Chloride: 97 mmol/L — ABNORMAL LOW (ref 98–111)
Creatinine, Ser: 0.46 mg/dL — ABNORMAL LOW (ref 0.61–1.24)
GFR, Estimated: >60 mL/min (ref >60)
Glucose, Bld: 99 mg/dL (ref 70–99)
Potassium: 4.4 mmol/L (ref 3.5–5.1)
Sodium: 124 mmol/L — ABNORMAL LOW (ref 135–145)

## 2022-07-31 LAB — MAGNESIUM: Magnesium: 1.7 mg/dL (ref 1.7–2.4)

## 2022-07-31 MED ORDER — MAGNESIUM SULFATE 2 GM/50ML IV SOLN
2.0000 g | Freq: Once | INTRAVENOUS | Status: AC
  Administered 2022-07-31: 2 g via INTRAVENOUS
  Filled 2022-07-31: qty 50

## 2022-07-31 MED ORDER — ORAL CARE MOUTH RINSE: 15.0000 mL | OROMUCOSAL | Status: DC | PRN

## 2022-07-31 MED ORDER — TRAMADOL HCL 50 MG PO TABS: 50.0000 mg | ORAL_TABLET | Freq: Four times a day (QID) | ORAL | Status: DC | PRN

## 2022-07-31 NOTE — TOC Progression Note (Addendum)
Transition of Care [facility]) - Progression Note    Patient Details  Name: Amoni Morales MRN: [id number] Date of Birth: [birth year]  [facility] of [facility]) CM/SW Contact  Trew Sunde, Juliann Pulse, RN Phone Number: 07/31/2022, 11:54 AM  Clinical Narrative: spoke to patient about [facility] SNF-agree to SNF-left vm w/Bertina duncan-[facility] rep [phone] await call back for [facility] SNF forms, & SNF facilities available.Will fax out await bed offers.  -2p-await MD signature on SNF [facility] forms.     Expected Discharge Plan: [place] Barriers to Discharge: Continued Medical Work up  Expected Discharge Plan and Services Expected Discharge Plan: [place]   Discharge Planning Services: CM Consult Post Acute Care Choice: [place] arrangements for the past 2 months: [place]: [place] ([place]) Date [place]: 07/28/22 Time [place]: [id number] Representative spoke with at [place]:  Caryl Pina)   Social Determinants of Health ([place]) Interventions    Readmission Risk Interventions    07/28/2022    2:36 PM  Readmission Risk Prevention Plan  Transportation Screening Complete  PCP or Specialist Appt within 3-5 Days Complete  HRI or [place] Complete  Social Work Consult for [place] Planning/Counseling Complete  Palliative Care Screening Not Applicable  Medication Review Press photographer) Complete

## 2022-07-31 NOTE — Progress Notes (Signed)
Dylan Weaver  HBZ:[id number] DOB: 01-Sep-1966 DOA: 07/27/2022 [facility]: [name]    Brief Narrative:  56 year old with a history of alcohol abuse who presented to the ED with complaint of pain in his buttocks but was found to be acutely confused and an apparent acute alcohol withdrawal after having abstained for 3 days.  Upon arrival he was covered in feces and quite disheveled.  CT pelvis in the ER revealed no evidence of abscess or free air within the soft tissues of the buttocks.  CT head was unrevealing.  Of note the patient had a nearly identical hospitalization in August of this year.  At that time attempts were made to place him within SNF but there were no bed offers.  She  Consultants:  None  Goals of Care:  Code Status: Full Code   DVT prophylaxis: SCDs  Interim Hx: No acute events reported overnight.  Afebrile.  Vital signs stable.  Magnesium not yet at goal.  Sodium drifting down again. Fully alert and interactive, but remains mildly confused (tells me he lives in [place]).   Assessment & Plan:  Acute alcohol withdrawal with delirium - alcoholism Doing well with CIWA protocol with delirium improving and agitation well controlled - discontinue CIWA today and follow   Mild transaminitis pattern consistent with alcohol abuse - normalized in absence of alcohol  Severe irritant contact dermatitis of the buttocks/perineum - cellulitis ruled out  Appears to primarily be due to very poor hygiene with skin encrusted with fecal matter at time of presentation - discontinued antibiotic as I suspect this will improve rapidly with simple hygiene/topical care - overall improvement in the erythema and no evidence of frank infection on follow-up exam  Hyponatremia Likely due to a combination of volume depletion with possible chronic component due to alcoholism - Na falling presently, but pt asymptomatic - monitor for now to determine his baseline    Hypokalemia Likely due to very poor intake/nutrition due to alcoholism - corrected with supplementation  Hypomagnesemia Supplement further to goal of 2.0  Normocytic anemia Likely simply due to bone marrow suppressive effects of chronic high-dose alcohol use - hemoglobin stable presently -iron studies consistent with anemia of chronic disease/poor nutrition - B12 and folic acid not low  L arm edema  During the course of the day 10/28 the pt developed significant swelling th/o his entire L arm - venous duplex ruled out DVT - likely simply due to infiltration of IV - resolved on exam today with discontinuation of IV  Disposition: PT recommending SNF for rehab stay  Objective: Blood pressure 118/87, pulse 83, temperature 97.7 F (36.5 C), temperature source Oral, resp. rate 19, height 6' (1.829 m), weight 81.6 kg, SpO2 100 %.  Intake/Output Summary (Last 24 hours) at 07/31/2022 0832 Last data filed at 07/31/2022 0600 Gross per 24 hour  Intake 2153.87 ml  Output 1750 ml  Net 403.87 ml    Filed Weights   07/27/22 1430  Weight: 81.6 kg    Examination: General: No acute respiratory distress - mildly confused  Lungs: Clear to auscultation bilaterally Cardiovascular: Regular rate and rhythm -no murmur Abdomen: Nontender, nondistended, soft, bowel sounds positive, no rebound Extremities: No significant edema bilateral lower extremities  CBC: Recent Labs  Lab 07/27/22 1550 07/28/22 0527 07/29/22 0519 07/30/22 0618 07/31/22 0500  WBC 10.8*   < > 9.5 10.7* 10.2  NEUTROABS 8.5*  --   --   --   --   HGB 11.2*   < >  9.7* 8.8* 10.7*  HCT 32.4*   < > 29.4* 25.9* 31.6*  MCV 91.3   < > 96.1 93.8 94.0  PLT 249   < > 284 282 223   < > = values in this interval not displayed.    Basic Metabolic Panel: Recent Labs  Lab 07/28/22 0527 07/29/22 0714 07/30/22 0618 07/31/22 0500  NA 128* 133* 127* 124*  K 3.6 4.4 4.2 4.4  CL 93* 104 101 97*  CO2 25 23 21* 21*  GLUCOSE 74 97  89 99  BUN 8 6 <5* 7  CREATININE 0.51* 0.48* 0.37* 0.46*  CALCIUM 8.5* 8.4* 8.5* 8.6*  MG 2.0 1.7 1.4* 1.7  PHOS 2.6 2.5 2.6  --     GFR: Estimated Creatinine Clearance: 113.2 mL/min (A) (by C-G formula based on SCr of 0.46 mg/dL (L)).   Scheduled Meds:  feeding supplement  237 mL Oral BID BM   folic acid  1 mg Oral Daily   LORazepam  0-4 mg Intravenous Q8H   multivitamin with minerals  1 tablet Oral Daily   nutrition supplement (JUVEN)  1 packet Oral BID WC   thiamine  100 mg Oral Daily   Continuous Infusions:  0.9 % NaCl with KCl 20 mEq / L 60 mL/hr at 07/31/22 4696     LOS: 4 days   Cherene Altes, MD [facility] Office  [phone] Pager - Text Page per Shea Evans  If 7PM-7AM, please contact night-coverage per Amion 07/31/2022, 8:32 AM

## 2022-07-31 NOTE — NC FL2 (Signed)
[place] LEVEL OF CARE SCREENING TOOL     IDENTIFICATION  Patient Name: Dylan Weaver Birthdate: 29-Sep-1966 Sex: male Admission Date (Current Location): 07/27/2022  [facility] and [place] Number:  Herbalist and Address:  [facility],  [place], [place]      Provider Number: [id number]  Attending Physician Name and Address:  Cherene Altes, MD  Relative Name and Phone Number:   [facility](Aunt )[phone])    Current Level of Care: Hospital Recommended Level of Care: [facility] Prior Approval Number:    Date Approved/Denied:   PASRR Number:  [id number])  Discharge Plan: SNF    Current Diagnoses: Patient Active Problem List   Diagnosis Date Noted   Cellulitis 07/27/2022   Hypophosphatemia 04/29/2022   Enteropathogenic Escherichia coli infection 04/27/2022   Hypokalemia 04/25/2022   Aspiration pneumonia ([place]) 04/25/2022   Debility 51/88/4166   Acute metabolic encephalopathy 03/31/1600   Enterococcal bacteremia    Pressure ulcer 10/24/2021   Irritant contact dermatitis due to incontinence of both feces and urine 10/22/2021   Rhabdomyolysis 10/22/2021   Sepsis with acute organ dysfunction without septic shock due to bilateral thigh/pubic area cellulitis and enterococcal bacteremia 10/22/2021   Hypomagnesemia 08/31/2021   Hepatic steatosis 08/31/2021   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 breakdown of skin ([place])    Tobacco abuse 08/26/2021   Infection of right foot with necrosis 08/26/2021   Foot infection 08/26/2021   Hyponatremia    Decompensated hepatic cirrhosis ([place]) 08/25/2021   Protein-calorie malnutrition, severe 06/14/2021   Elevated LFTs 11/29/2020   Intertrigo 11/29/2020   QT prolongation 11/29/2020   Malnutrition of moderate degree 09/16/2020   Alcohol withdrawal ([place]) 09/32/3557   Alcoholic hepatitis 32/20/2542   Thrombocytopenia ([place])  09/14/2020   Normochromic normocytic anemia 09/14/2020   Alcohol use disorder with alcohol withdrawal  06/19/2020   Alcohol abuse with intoxication ([place]) 06/19/2020    Orientation RESPIRATION BLADDER Height & Weight     Self, Time, Situation, Place  Normal Continent Weight: 81.6 kg Height:  6' (182.9 cm)  BEHAVIORAL SYMPTOMS/MOOD NEUROLOGICAL BOWEL NUTRITION STATUS      Continent Diet (Regular)  AMBULATORY STATUS COMMUNICATION OF NEEDS Skin   Limited Assist Verbally Other (Comment), PU Stage and Appropriate Care (diffuse areas-barrier cream daily;prn)                       Personal Care Assistance Level of Assistance  Bathing, Feeding, Dressing Bathing Assistance: Limited assistance Feeding assistance: Limited assistance Dressing Assistance: Limited assistance     Functional Limitations Info  Sight, Hearing, Speech Sight Info: Adequate Hearing Info: Adequate Speech Info: Adequate    SPECIAL CARE FACTORS FREQUENCY  PT (By licensed PT), OT (By licensed OT)     PT Frequency:  (5x week) OT Frequency:  (5x week)            Contractures Contractures Info: Not present    Additional Factors Info  Code Status, Allergies Code Status Info:  (Full) Allergies Info:  (Midodrine)           Current Medications (07/31/2022):  This is the current hospital active medication list Current Facility-Administered Medications  Medication Dose Route Frequency Provider Last Rate Last Admin   acetaminophen (TYLENOL) tablet 500 mg  500 mg Oral Q6H PRN Cherene Altes, MD   500 mg at 07/30/22 2002   feeding supplement (ENSURE ENLIVE / ENSURE  PLUS) liquid 237 mL  237 mL Oral BID BM Lonia Blood, MD   237 mL at 07/31/22 1024   folic acid (FOLVITE) tablet 1 mg  1 mg Oral Daily Doutova, Anastassia, MD   1 mg at 07/31/22 1022   multivitamin with minerals tablet 1 tablet  1 tablet Oral Daily Therisa Doyne, MD   1 tablet at 07/31/22 1022   nutrition supplement (JUVEN)  (JUVEN) powder packet 1 packet  1 packet Oral BID WC Lonia Blood, MD   1 packet at 07/31/22 0753   Oral care mouth rinse  15 mL Mouth Rinse PRN Lonia Blood, MD       thiamine (VITAMIN B1) tablet 100 mg  100 mg Oral Daily Doutova, Anastassia, MD   100 mg at 07/31/22 1022     Discharge Medications: Please see discharge summary for a list of discharge medications.  Relevant Imaging Results:  Relevant Lab Results:   Additional Information  [phone])  Tiawana Forgy, Olegario Messier, RN

## 2022-08-01 LAB — VITAMIN B1: Vitamin B1 (Thiamine): 86.9 nmol/L (ref 66.5–200.0)

## 2022-08-01 LAB — MAGNESIUM: Magnesium: 1.8 mg/dL (ref 1.7–2.4)

## 2022-08-01 LAB — BASIC METABOLIC PANEL
Anion gap: 8 (ref 5–15)
BUN: 10 mg/dL (ref 6–20)
CO2: 21 mmol/L — ABNORMAL LOW (ref 22–32)
Calcium: 9.2 mg/dL (ref 8.9–10.3)
Chloride: 102 mmol/L (ref 98–111)
Creatinine, Ser: 0.57 mg/dL — ABNORMAL LOW (ref 0.61–1.24)
GFR, Estimated: >60 mL/min (ref >60)
Glucose, Bld: 82 mg/dL (ref 70–99)
Potassium: 4.6 mmol/L (ref 3.5–5.1)
Sodium: 131 mmol/L — ABNORMAL LOW (ref 135–145)

## 2022-08-01 LAB — CULTURE, BLOOD (ROUTINE X 2)
Culture: NO GROWTH
Culture: NO GROWTH
Special Requests: ADEQUATE

## 2022-08-01 NOTE — Progress Notes (Addendum)
Dylan Weaver  WER:[id number] DOB: [birth year] DOA: 07/27/2022 PCP: [facility]    Brief Narrative:  56 year old with a history of alcohol abuse who presented to the ED with complaint of pain in his buttocks but was found to be acutely confused and an apparent acute alcohol withdrawal after having abstained for 3 days.  Upon arrival he was covered in feces and quite disheveled.  CT pelvis in the ER revealed no evidence of abscess or free air within the soft tissues of the buttocks.  CT head was unrevealing.  Of note the patient had a nearly identical hospitalization in August of this year.  At that time attempts were made to place him within SNF but there were no bed offers.  She  Consultants:  None  Goals of Care:  Code Status: Full Code   DVT prophylaxis: SCDs  Interim Hx: Afebrile.  Vital signs stable.  Sodium has improved today.  Electrolytes are balanced.  No acute events reported overnight.  Quite pleasant and conversant, but on further questioning remains confused.  I suspect this may actually be his baseline and reflective of alcohol associated dementia.  He tells me he is in [place] but when corrected is able to confirm to me that he lives in his parents house with him here in [place].  Assessment & Plan:  Acute alcohol withdrawal with delirium - alcoholism w/ Alcohol associated dementia Did well with CIWA protocol with delirium improved and agitation well controlled - has now completed CIWA course - some confusion persist which I suspect is alcohol associated dementia  Mild transaminitis pattern consistent with alcohol abuse - normalized in absence of alcohol  Severe irritant contact dermatitis of the buttocks/perineum - cellulitis ruled out  Appears to primarily be due to very poor hygiene with skin encrusted with fecal matter at time of presentation - discontinued antibiotic as there was no clear sign of infection - overall improvement in the erythema  and no evidence of frank infection on follow-up exam  Hyponatremia Likely due to a combination of volume depletion with possible chronic component due to alcoholism - has stabilized in absence of alcohol  Hypokalemia Likely due to very poor intake/nutrition due to alcoholism - corrected with supplementation  Hypomagnesemia Corrected with supplementation  Moderate Malnutrition related to social / environmental circumstances (alcohol abuse) as evidenced by mild fat depletion, moderate muscle depletion  Normocytic anemia Likely simply due to bone marrow suppressive effects of chronic high-dose alcohol use - hemoglobin stable presently -iron studies consistent with anemia of chronic disease/poor nutrition - P95 and folic acid not low  L arm edema  During the course of the day 10/28 the pt developed significant swelling th/o his entire L arm - venous duplex ruled out DVT - likely simply due to infiltration of IV - resolved on follow-up exam with discontinuation of IV  Disposition: PT recommending SNF for rehab stay -now medically ready for discharge awaiting SNF bed  Objective: Blood pressure 96/63, pulse 82, temperature 98.4 F (36.9 C), temperature source Oral, resp. rate 17, height 6' (1.829 m), weight 81.6 kg, SpO2 100 %.  Intake/Output Summary (Last 24 hours) at 08/01/2022 0845 Last data filed at 08/01/2022 0551 Gross per 24 hour  Intake 530 ml  Output 1100 ml  Net -570 ml    Filed Weights   07/27/22 1430  Weight: 81.6 kg    Examination: General: No acute respiratory distress -subtle confusion persists Lungs: Clear to auscultation bilaterally without wheezing Cardiovascular: Regular rate  and rhythm -no murmur Abdomen: Nontender, nondistended, soft, bowel sounds positive, no rebound Extremities: No edema bilateral lower extremities  CBC: Recent Labs  Lab 07/27/22 1550 07/28/22 0527 07/29/22 0519 07/30/22 0618 07/31/22 0500  WBC 10.8*   < > 9.5 10.7* 10.2   NEUTROABS 8.5*  --   --   --   --   HGB 11.2*   < > 9.7* 8.8* 10.7*  HCT 32.4*   < > 29.4* 25.9* 31.6*  MCV 91.3   < > 96.1 93.8 94.0  PLT 249   < > 284 282 223   < > = values in this interval not displayed.    Basic Metabolic Panel: Recent Labs  Lab 07/28/22 0527 07/29/22 0714 07/30/22 0618 07/31/22 0500 08/01/22 0517  NA 128* 133* 127* 124* 131*  K 3.6 4.4 4.2 4.4 4.6  CL 93* 104 101 97* 102  CO2 25 23 21* 21* 21*  GLUCOSE 74 97 89 99 82  BUN 8 6 <5* 7 10  CREATININE 0.51* 0.48* 0.37* 0.46* 0.57*  CALCIUM 8.5* 8.4* 8.5* 8.6* 9.2  MG 2.0 1.7 1.4* 1.7 1.8  PHOS 2.6 2.5 2.6  --   --     GFR: Estimated Creatinine Clearance: 113.2 mL/min (A) (by C-G formula based on SCr of 0.57 mg/dL (L)).   Scheduled Meds:  feeding supplement  237 mL Oral BID BM   folic acid  1 mg Oral Daily   multivitamin with minerals  1 tablet Oral Daily   nutrition supplement (JUVEN)  1 packet Oral BID WC   thiamine  100 mg Oral Daily     LOS: 5 days   Lonia Blood, MD [facility] Office  [phone] Pager - Text Page per Loretha Stapler  If 7PM-7AM, please contact night-coverage per Amion 08/01/2022, 8:45 AM

## 2022-08-01 NOTE — Progress Notes (Signed)
Physical Therapy Treatment Patient Details Name: Dylan Weaver MRN: [id number] DOB: 03-Jul-1966 Today's Date: 08/01/2022   History of Present Illness Pt is a 56 yo male admittd with cellulitis of buttocks and groin as well as acute alcohol w/d. Pt had similar admit in August 2023.    PT Comments    Pt was familiar from prior admits.  Currently in bed.  General Comments: Pt in alcohol withdrawel. Disoriented to current place/time.  He thinks he is in [place],  [place] unaware of current medical condition.  Following repeat simple functional commands. Pt asked, "Am I spending the night?"  Attempted to reorient only led to pt frustration.   Assisted OOB to amb to was difficult.  General Gait Details: VERY limited amb distance to and from bathroom plus MAX unsteadiness.  Severe poeterior lean as well as lateral lean.  Poor self correction.  Poor spacial awareness.  HIGH FALL RISK.  General transfer comment: VERY unsteady requiring any where from Min to Max Assist present with severe posterior lean and lateral instability with poor self correction.  Also required seated rest breaks.  Poor spacial awareness.  HIGH FALL RISK.  Also assisted with a toilet transfer in which pt required Max Assist to prevent "falling backward" on commode.  Near fall in bathroom with turning/Therapist recovered. Assisted to recliner as pt was unable to amb in hallway. Vitals Supine      BP 94/73 Standing    BP 107/79 No dizziness HR increased to 158 while amb out of bathroom to recliner.   Pt will need ST Rehab at SNF to address mobility and functional decline prior to safely returning home.   Recommendations for follow up therapy are one component of a multi-disciplinary discharge planning process, led by the attending physician.  Recommendations may be updated based on patient status, additional functional criteria and insurance authorization.  Follow Up Recommendations  Skilled nursing-short term rehab (<3  hours/day) Can patient physically be transported by private vehicle: No   Assistance Recommended at Discharge Frequent or constant Supervision/Assistance  Patient can return home with the following Help with stairs or ramp for entrance;Assist for transportation;Assistance with cooking/housework;A lot of help with bathing/dressing/bathroom;Two people to help with walking and/or transfers   Equipment Recommendations  None recommended by PT    Recommendations for Other Services       Precautions / Restrictions Precautions Precautions: Fall Restrictions Weight Bearing Restrictions: No     Mobility  Bed Mobility Overal bed mobility: Needs Assistance Bed Mobility: Supine to Sit     Supine to sit: Min assist, Mod assist     General bed mobility comments: required increased time and repeat VC's to stay on task.  Pt present with delayed initiation and easily distracted.    Transfers Overall transfer level: Needs assistance Equipment used: Rolling walker (2 wheels) Transfers: Sit to/from Stand, Bed to chair/wheelchair/BSC Sit to Stand: +2 safety/equipment, Min assist, +2 physical assistance, Mod assist, Max assist           General transfer comment: VERY unsteady requiring any where from Min to Max Assist present with severe posterior lean and lateral instability with poor self correction.  Also required seated rest breaks.  Poor spacial awareness.  HIGH FALL RISK.  Also assisted with a toilet transfer in which pt required Max Assist to prevent "falling backward" on commode.  Near fall in bathroom with turning/Therapist recovered.    Ambulation/Gait Ambulation/Gait assistance: Max assist, Total assist, +2 physical assistance, +2 safety/equipment Gait Distance (  Feet): 8 Feet Assistive device: Rolling walker (2 wheels) Gait Pattern/deviations: Step-to pattern, Staggering right, Drifts right/left, Narrow base of support, Shuffle Gait velocity: decreased     General Gait  Details: VERY limited amb distance to and from bathroom plus MAX unsteadiness.  Severe poeterior lean as well as lateral lean.  Poor self correction.  Poor spacial awareness.  HIGH FALL RISK.   Stairs             Wheelchair Mobility    Modified Rankin (Stroke Patients Only)       Balance                                            Cognition Arousal/Alertness: Awake/alert Behavior During Therapy: Impulsive Overall Cognitive Status: Impaired/Different from baseline Area of Impairment: Awareness, Problem solving, Safety/judgement, Following commands, Orientation                 Orientation Level: Disoriented to, Place, Situation, Time             General Comments: Pt in alcohol withdrawel. Disoriented to current place/time.  He thinks he is in [place],  Pt unaware of current medical condition.  Following repeat simple functional commands.        Exercises      General Comments        Pertinent Vitals/Pain Pain Assessment Pain Assessment: No/denies pain    Home Living                          Prior Function            PT Goals (current goals can now be found in the care plan section) Progress towards PT goals: Progressing toward goals    Frequency    7X/week      PT Plan Current plan remains appropriate    Co-evaluation              AM-PAC PT "6 Clicks" Mobility   Outcome Measure  Help needed turning from your back to your side while in a flat bed without using bedrails?: A Lot Help needed moving from lying on your back to sitting on the side of a flat bed without using bedrails?: A Lot Help needed moving to and from a bed to a chair (including a wheelchair)?: A Lot Help needed standing up from a chair using your arms (e.g., wheelchair or bedside chair)?: A Lot Help needed to walk in hospital room?: Total Help needed climbing 3-5 steps with a railing? : Total 6 Click Score: 10    End of Session  Equipment Utilized During Treatment: Gait belt Activity Tolerance: Patient limited by fatigue Patient left: in chair;with call bell/phone within reach;with chair alarm set Nurse Communication: Mobility status PT Visit Diagnosis: Difficulty in walking, not elsewhere classified (R26.2);History of falling (Z91.81)     Time: 9924-2683 PT Time Calculation (min) (ACUTE ONLY): 26 min  Charges:  $Gait Training: 8-22 mins $Therapeutic Activity: 8-22 mins                     Felecia Shelling  PTA Acute  Rehabilitation Services Office M-F          [phone] Weekend pager [phone]

## 2022-08-01 NOTE — TOC Progression Note (Signed)
Transition of Care [facility]) - Progression Note    Patient Details  Name: Dylan Weaver MRN: [id number] Date of Birth: 1966-04-23  [facility] of [facility]) CM/SW Contact  Alaynna Kerwood, Juliann Pulse, RN Phone Number: 08/01/2022, 3:49 PM  Clinical Narrative:   [facility].No bed offers.    Expected Discharge Plan: [place] Barriers to Discharge: Continued Medical Work up  Expected Discharge Plan and Services Expected Discharge Plan: [place]   Discharge Planning Services: CM Consult Post Acute Care Choice: [place] arrangements for the past 2 months: [place]: [place] ([place]) Date [place]: 07/28/22 Time [place]: [id number] Representative spoke with at [place]:  Caryl Pina)   Social Determinants of Health ([place]) Interventions    Readmission Risk Interventions    07/28/2022    2:36 PM  Readmission Risk Prevention Plan  Transportation Screening Complete  PCP or Specialist Appt within 3-5 Days Complete  HRI or [place] Complete  Social Work Consult for [place] Planning/Counseling Complete  Palliative Care Screening Not Applicable  Medication Review Press photographer) Complete

## 2022-08-02 NOTE — TOC Progression Note (Signed)
Transition of Care [facility]) - Progression Note    Patient Details  Name: Dylan Weaver MRN: [id number] Date of Birth: [birth year]  Transition of Care [facility]) CM/SW Contact  Lynzie Cliburn, Juliann Pulse, RN Phone Number: 08/02/2022, 3:24 PM  Clinical Narrative: [facility] for [facility]F. No current bed offers locally. If approved by [place] they will provide further out of county ST SNF facilities.      Expected Discharge Plan: [place] Barriers to Discharge: Continued Medical Work up  Expected Discharge Plan and Services Expected Discharge Plan: [place]   Discharge Planning Services: CM Consult Post Acute Care Choice: [place] arrangements for the past 2 months: [place]: [place] ([place]) Date [place]: 07/28/22 Time [place]: [id number] Representative spoke with at [place]:  Caryl Pina)   Social Determinants of Health ([place]) Interventions    Readmission Risk Interventions    07/28/2022    2:36 PM  Readmission Risk Prevention Plan  Transportation Screening Complete  PCP or Specialist Appt within 3-5 Days Complete  HRI or [place] Complete  Social Work Consult for [place] Planning/Counseling Complete  Palliative Care Screening Not Applicable  Medication Review Press photographer) Complete

## 2022-08-02 NOTE — Plan of Care (Signed)
Problem: Safety: Goal: Ability to remain free from injury will improve Outcome: Progressing   Problem: Skin Integrity: Goal: Risk for impaired skin integrity will decrease Outcome: Progressing   Problem: Pain Managment: Goal: General experience of comfort will improve Outcome: [place], RN 08/02/22 8:19 AM

## 2022-08-02 NOTE — Progress Notes (Signed)
Occupational Therapy Treatment Patient Details Name: Dylan Weaver MRN: [id number] DOB: 1966-04-14 Today's Date: 08/02/2022   History of present illness Dylan Weaver is a 56 y.o. male with medical history significant of  alcohol abuse, history of enterococcus bacteremia and ulcers in the past.  Presented with buttock pain. Patient with known history of alcohol abuse states that he drove here from of [place] patient is actively hallucinating admits to drinking at least 6-12 beers per day have not been having any alcohol for past 3 days.  Reports that he has sustained a fall on his buttocks. Was noted to be covered in feces and disheveled.  There is redness of his buttocks consistent with likely cellulitis.  Patient is hallucinating stating that he has slipped on ice  Admits to alcohol abuse   OT comments  Patient was motivated to participate in the session. He presented with pronounced posterior lean in standing, and subsequently needed to sit down suddenly after an initial stand. He stood again, then performed a stand-step transfer to the bedside chair, requiring mod assist, increased cues, and use of a RW for support. He reported feelings of decreased overall activity tolerance. Without further OT services, he is at risk for falls and restricted ADL participation.   Recommendations for follow up therapy are one component of a multi-disciplinary discharge planning process, led by the attending physician.  Recommendations may be updated based on patient status, additional functional criteria and insurance authorization.    Follow Up Recommendations  Skilled nursing-short term rehab (<3 hours/day)    Assistance Recommended at Discharge Frequent or constant Supervision/Assistance  Patient can return home with the following  A lot of help with walking and/or transfers;A lot of help with bathing/dressing/bathroom;Assistance with cooking/housework;Direct supervision/assist for  medications management;Direct supervision/assist for financial management;Assist for transportation;Help with stairs or ramp for entrance   Equipment Recommendations  Other (comment) (to be determined pending progress at next setting)       Precautions / Restrictions Precautions Precautions: Fall Restrictions Weight Bearing Restrictions: No       Mobility Bed Mobility Overal bed mobility: Needs Assistance Bed Mobility: Supine to Sit     Supine to sit: Supervision, HOB elevated          Transfers Overall transfer level: Needs assistance Equipment used: Rolling walker (2 wheels) Transfers: Sit to/from Stand, Bed to chair/wheelchair/BSC Sit to Stand: Mod assist; 2 stands performed            General transfer comment: He presented with posterior lean in standing, requiring cues to correct. He required cues for RW advancement and step sequencing, in order to transfer to the bedside chair.         ADL either performed or assessed with clinical judgement              Cognition Arousal/Alertness: Awake/alert Behavior During Therapy: WFL for tasks assessed/performed            Following Commands: Follows one step commands consistently       General Comments: He was oriented to person, place, month, and year. Able to follow 1 step commands consistently. Slightly delayed cognitive processing at times      Pertinent Vitals/ Pain       Pain Assessment Pain Assessment: No/denies pain         Frequency  Min 2X/week        Progress Toward Goals  OT Goals(current goals can now be found in the care plan section)  Progress towards  OT goals: Progressing toward goals  Acute Rehab OT Goals OT Goal Formulation: With patient Time For Goal Achievement: 08/11/22 Potential to Achieve Goals: Good  Plan         AM-PAC OT "6 Clicks" Daily Activity     Outcome Measure   Help from another person eating meals?: None Help from another person taking care of  personal grooming?: None Help from another person toileting, which includes using toliet, bedpan, or urinal?: A Lot Help from another person bathing (including washing, rinsing, drying)?: A Lot Help from another person to put on and taking off regular upper body clothing?: A Little Help from another person to put on and taking off regular lower body clothing?: A Lot 6 Click Score: 17    End of Session Equipment Utilized During Treatment: Gait belt;Rolling walker (2 wheels)  OT Visit Diagnosis: Unsteadiness on feet (R26.81);Other abnormalities of gait and mobility (R26.89)   Activity Tolerance Patient tolerated treatment well   Patient Left in chair;with call bell/phone within reach;with chair alarm set           Time: 6767-2094 OT Time Calculation (min): 19 min  Charges: OT Evaluation $OT Eval Low Complexity: 1 Low OT Treatments $Therapeutic Activity: 8-22 mins     Leota Sauers, OTR/L 08/02/2022, 2:08 PM

## 2022-08-02 NOTE — Plan of Care (Signed)

## 2022-08-02 NOTE — Progress Notes (Signed)
[facility]                                                                               Dylan Weaver, is a 56 y.o. male, DOB - 02/12/1966, SJG:[id number] Admit date - 07/27/2022    Outpatient Primary MD for the patient is [facility], [facility]  LOS - 6  days    Brief summary   56 year old with a history of alcohol abuse who presented to the ED with complaint of pain in his buttocks but was found to be acutely confused and an apparent acute alcohol withdrawal after having abstained for 3 days.  Upon arrival he was covered in feces and quite disheveled.  CT pelvis in the ER revealed no evidence of abscess or free air within the soft tissues of the buttocks.  CT head was unrevealing.  Of note the patient had a nearly identical hospitalization in August of this year.  At that time attempts were made to place him within SNF but there were no bed offers   Assessment and Plan:   Acute alcohol withdrawal with delirium Alcohol associated dementia Delirium and agitation have improved Completed CIWA course.    Mild transaminitis Normalized.     Irritant contact dermatitis of the [employer] in.  EM Cellulitis has been ruled out Appears to be primarily due to poor hygiene, Antibiotics were discontinued as there was no clear signs of infection. Overall improvement in the erythema and no evidence of frank infection on follow-up exams.   Hyponatremia Stabilized in the absence of alcohol use.   Hypokalemia and hypomagnesemia Replaced and corrected.    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    Left arm edema Venous duplex ruled out DVT probably secondary to infiltration of IV. Appears to have improved.   Moderate malnutrition secondary to social and environmental circumstances and alcohol abuse. Supplementation on board   RN Pressure Injury Documentation: Pressure Injury  04/26/22 Back Mid Stage 2 -  Partial thickness loss of dermis presenting as a shallow open injury with a red, pink wound bed without slough. (Active)  04/26/22 1517  Location: Back  Location Orientation: Mid  Staging: Stage 2 -  Partial thickness loss of dermis presenting as a shallow open injury with a red, pink wound bed without slough.  Wound Description (Comments):   Present on Admission: Yes    Malnutrition Type:  Nutrition Problem: Moderate Malnutrition Etiology: social / environmental circumstances (alcohol abuse)   Malnutrition Characteristics:  Signs/Symptoms: mild fat depletion, moderate muscle depletion   Nutrition Interventions:  Interventions: Ensure Enlive (each supplement provides 350kcal and 20 grams of protein), MVI, Juven  Estimated body mass index is 24.41 kg/m as calculated from the following:   Height as of this encounter: 6' (1.829 m).   Weight as of this encounter: 81.6 kg.  Code Status: Full code DVT Prophylaxis:  SCDs Start: 07/27/22 2208   Level of Care: Level of care: Med-Surg Family Communication: None at bedside  Disposition Plan:     Remains inpatient appropriate:  SNF placement.   Procedures:  None.  Consultants:   None.   Antimicrobials:  Anti-infectives (From admission, onward)    Start     Dose/Rate Route Frequency Ordered Stop   07/27/22 2300  vancomycin (VANCOREADY) IVPB 1500 mg/300 mL  Status:  Discontinued        1,500 mg 150 mL/hr over 120 Minutes Intravenous Every 12 hours 07/27/22 1941 07/28/22 0859   07/27/22 1945  ceFEPIme (MAXIPIME) 2 g in sodium chloride 0.9 % 100 mL IVPB  Status:  Discontinued        2 g 200 mL/hr over 30 Minutes Intravenous Every 8 hours 07/27/22 1941 07/28/22 0858   07/27/22 1530  vancomycin (VANCOCIN) IVPB 1000 mg/200 mL premix        1,000 mg 200 mL/hr over 60 Minutes Intravenous  Once 07/27/22 1525 07/27/22 1736        Medications  Scheduled Meds:  feeding supplement  237 mL Oral BID  BM   folic acid  1 mg Oral Daily   multivitamin with minerals  1 tablet Oral Daily   nutrition supplement (JUVEN)  1 packet Oral BID WC   thiamine  100 mg Oral Daily   Continuous Infusions: PRN Meds:.acetaminophen, mouth rinse    Subjective:   Dylan Weaver was seen and examined today.  Wants to go out in the fresh air and smoke cigarette,.  Says he doesn't have intention of stopping smoking and would like to smoke 1 to 2 cigarettes.  Refused the nicotine patch.    Objective:   Vitals:   08/01/22 1300 08/01/22 2047 08/02/22 0547 08/02/22 0634  BP: 94/70 93/60 (!) 83/63 (!) 91/55  Pulse: 95 82 80   Resp: 18 17 18    Temp: (!) 97.3 F (36.3 C) 99.4 F (37.4 C) 97.8 F (36.6 C)   TempSrc: Oral Oral Oral   SpO2: 99% 98% 99%   Weight:      Height:        Intake/Output Summary (Last 24 hours) at 08/02/2022 1108 Last data filed at 08/02/2022 0548 Gross per 24 hour  Intake 480 ml  Output 1150 ml  Net -670 ml   Filed Weights   07/27/22 1430  Weight: 81.6 kg     Exam General: Alert and oriented x 3, NAD Cardiovascular: S1 S2 auscultated, no murmurs, RRR Respiratory: Clear to auscultation bilaterally, no wheezing, rales or rhonchi Gastrointestinal: Soft, nontender, nondistended, + bowel sounds Ext: no pedal edema bilaterally Neuro: AAOx3, Cr N's II- XII. Strength 5/5 upper and lower extremities bilaterally Skin: No rashes Psych: Normal affect and demeanor, alert and oriented x3    Data Reviewed:  I have personally reviewed following labs and imaging studies   CBC Lab Results  Component Value Date   WBC 10.2 07/31/2022   RBC 3.36 (L) 07/31/2022   HGB 10.7 (L) 07/31/2022   HCT 31.6 (L) 07/31/2022   MCV 94.0 07/31/2022   MCH 31.8 07/31/2022   PLT 223 07/31/2022   MCHC 33.9 07/31/2022   RDW 15.5 07/31/2022   LYMPHSABS 1.5 07/27/2022   MONOABS 0.5 07/27/2022   EOSABS 0.1 07/27/2022   BASOSABS 0.1 74/25/9563     Last metabolic panel Lab Results  Component  Value Date   NA 131 (L) 08/01/2022   K 4.6 08/01/2022   CL 102 08/01/2022   CO2 21 (L) 08/01/2022   BUN 10 08/01/2022   CREATININE 0.57 (L) 08/01/2022   GLUCOSE 82 08/01/2022   GFRNONAA >60 08/01/2022   CALCIUM 9.2 08/01/2022   PHOS 2.6 07/30/2022   PROT 6.9 07/30/2022   ALBUMIN  2.5 (L) 07/30/2022   BILITOT 1.0 07/30/2022   ALKPHOS 167 (H) 07/30/2022   AST 64 (H) 07/30/2022   ALT 43 07/30/2022   ANIONGAP 8 08/01/2022    CBG (last 3)  No results for input(s): "GLUCAP" in the last 72 hours.    Coagulation Profile: Recent Labs  Lab 07/27/22 1550 07/28/22 0527  INR 1.0 1.1     Radiology Studies: No results found.     Hosie Poisson M.D. [facility] 08/02/2022, 11:08 AM  Available via Epic secure chat 7am-7pm After 7 pm, please refer to night coverage provider listed on amion.

## 2022-08-02 NOTE — Progress Notes (Signed)
Notified on call provider that patient's BP this morning was 83/63 with a MAP of 70 during routine vitals. RN rechecked BP it and it was 91/55 with a MAP of 66.

## 2022-08-03 NOTE — TOC Progression Note (Addendum)
Transition of Care [facility]) - Progression Note    Patient Details  Name: Dylan Weaver MRN: [id number] Date of Birth: 1966/06/24  Transition of [facility] CM/SW Contact  Dawon Troop, Juliann Pulse, RN Phone Number: 08/03/2022, 9:56 AM  Clinical Narrative:[facility]axed updated PT/OT/MD [facility]fax[place][phone].No bed offers currently for ST SNF.    -2:55p-Received call from [facility] Guiles [facility]([place] [facility] [phone] [id number] for 32days of [facility] SNF [facility] approved list.    Expected Discharge Plan: [place] Barriers to Discharge: Continued Medical Work up  Expected Discharge Plan and Services Expected Discharge Plan: [place]   Discharge Planning Services: CM Consult Post Acute Care Choice: [place] arrangements for the past 2 months: [place]: [place] ([place]) Date [place]: 07/28/22 Time [place]: [id number] Representative spoke with at [place]:  Caryl Pina)   Social Determinants of Health ([place]) Interventions    Readmission Risk Interventions    07/28/2022    2:36 PM  Readmission Risk Prevention Plan  Transportation Screening Complete  PCP or Specialist Appt within 3-5 Days Complete  HRI or [place] Complete  Social Work Consult for [place] Planning/Counseling Complete  Palliative Care Screening Not Applicable  Medication Review Press photographer) Complete

## 2022-08-03 NOTE — Progress Notes (Signed)
Physical Therapy Treatment Patient Details Name: Dylan Weaver MRN: [id number] DOB: Jul 15, 1966 Today's Date: 08/03/2022   History of Present Illness Argus Caraher is a 56 y.o. male with medical history significant of  alcohol abuse, history of enterococcus bacteremia and ulcers in the past.  Presented with buttock pain. Patient with known history of alcohol abuse states that he drove here from of [place] patient is actively hallucinating admits to drinking at least 6-12 beers per day have not been having any alcohol for past 3 days.  Reports that he has sustained a fall on his buttocks. Was noted to be covered in feces and disheveled.  There is redness of his buttocks consistent with likely cellulitis.  Patient is hallucinating stating that he has slipped on ice  Admits to alcohol abuse    PT Comments    Patient is making gradual progress with mobility and followed cues for safety with bed mobility and transfers. Pt required min assist to complete sit<>stand and cues to weight shift into great toes to facilitate anterior trunk lean. Patient required Min-Mod assist for gait and tactile cue at back to anterior weight shift as well as mod assist to prevent LOB due to impulsiveness with turning. EOS pt resting in recliner alarm on and pt instructed to call with call bell for assist. He will benefit from [facility]. Will progress as able.   Recommendations for follow up therapy are one component of a multi-disciplinary discharge planning process, led by the attending physician.  Recommendations may be updated based on patient status, additional functional criteria and insurance authorization.  Follow Up Recommendations  Skilled nursing-short term rehab (<3 hours/day) Can patient physically be transported by private vehicle: No   Assistance Recommended at Discharge Frequent or constant Supervision/Assistance  Patient can return home with the following Help with stairs or ramp  for entrance;Assist for transportation;Assistance with cooking/housework;A lot of help with bathing/dressing/bathroom;Two people to help with walking and/or transfers   Equipment Recommendations  Rolling walker (2 wheels) (TBD)    Recommendations for Other Services       Precautions / Restrictions Precautions Precautions: Fall Restrictions Weight Bearing Restrictions: No     Mobility  Bed Mobility Overal bed mobility: Needs Assistance Bed Mobility: Supine to Sit     Supine to sit: Supervision, HOB elevated    Supervision for safety.      Transfers Overall transfer level: Needs assistance Equipment used: Rolling walker (2 wheels) Transfers: Sit to/from Stand, Bed to chair/wheelchair/BSC Sit to Stand: Mod assist    Mod assist to power up from EOB and cues to reduce posterior lean. VC to weight shift into great toes and lean chest over toes, tactile cues/pressure to facilitate anterior lean.            Ambulation/Gait Ambulation/Gait assistance: Min assist, Mod assist, +2 safety/equipment Gait Distance (Feet): 80 Feet Assistive device: Rolling walker (2 wheels) Gait Pattern/deviations: Step-through pattern, Decreased stride length, Trunk flexed, Narrow base of support, Scissoring Gait velocity: decr      Min-Mod assist for safety with pt attempting to turn impulsively and Mod assist required to prevent LOB.    Stairs             Wheelchair Mobility    Modified Rankin (Stroke Patients Only)       Balance Overall balance assessment: Needs assistance, History of Falls Sitting-balance support: Feet supported, Single extremity supported Sitting balance-Leahy Scale: Fair     Standing balance support: Bilateral upper extremity  supported, Reliant on assistive device for balance, During functional activity Standing balance-Leahy Scale: Poor                              Cognition Arousal/Alertness: Awake/alert Behavior During Therapy: WFL  for tasks assessed/performed Overall Cognitive Status: Impaired/Different from baseline Area of Impairment: Orientation, Attention, Memory, Following commands, Safety/judgement, Awareness, Problem solving                 Orientation Level: Disoriented to, Place, Situation, Time Current Attention Level: Sustained Memory: Decreased recall of precautions, Decreased short-term memory Following Commands: Follows one step commands consistently, Follows one step commands with increased time Safety/Judgement: Decreased awareness of safety, Decreased awareness of deficits Awareness: Emergent Problem Solving: Slow processing, Decreased initiation, Difficulty sequencing, Requires verbal cues          Exercises      General Comments        Pertinent Vitals/Pain Pain Assessment Pain Assessment: No/denies pain Pain Intervention(s): Monitored during session, Repositioned    Home Living                          Prior Function            PT Goals (current goals can now be found in the care plan section) Acute Rehab PT Goals Patient Stated Goal: none stated PT Goal Formulation: With patient Time For Goal Achievement: 08/12/22 Potential to Achieve Goals: Fair Progress towards PT goals: Progressing toward goals    Frequency    Min 2X/week      PT Plan Current plan remains appropriate    Co-evaluation              AM-PAC PT "6 Clicks" Mobility   Outcome Measure  Help needed turning from your back to your side while in a flat bed without using bedrails?: A Lot Help needed moving from lying on your back to sitting on the side of a flat bed without using bedrails?: A Lot Help needed moving to and from a bed to a chair (including a wheelchair)?: A Lot Help needed standing up from a chair using your arms (e.g., wheelchair or bedside chair)?: A Lot Help needed to walk in hospital room?: A Lot Help needed climbing 3-5 steps with a railing? : Total 6 Click  Score: 11    End of Session Equipment Utilized During Treatment: Gait belt Activity Tolerance: Patient tolerated treatment well Patient left: in chair;with call bell/phone within reach;with chair alarm set Nurse Communication: Mobility status PT Visit Diagnosis: Difficulty in walking, not elsewhere classified (R26.2);History of falling (Z91.81)     Time: 1950-9326 PT Time Calculation (min) (ACUTE ONLY): 24 min  Charges:  $Gait Training: 8-22 mins $Therapeutic Activity: 8-22 mins                     Verner Mould, DPT Acute Rehabilitation Services Office [phone]  08/03/22 1:13 PM

## 2022-08-03 NOTE — Progress Notes (Signed)
[facility]                                                                               Dylan Weaver, is a 56 y.o. male, DOB - 1966-05-12, DZH:[id number] Admit date - 07/27/2022    Outpatient Primary MD for the patient is [facility] [place]  LOS - 7  days    Brief summary   56 year old with a history of alcohol abuse who presented to the ED with complaint of pain in his buttocks but was found to be acutely confused and an apparent acute alcohol withdrawal after having abstained for 3 days.  Upon arrival he was covered in feces and quite disheveled.  CT pelvis in the ER revealed no evidence of abscess or free air within the soft tissues of the buttocks.  CT head was unrevealing.  Of note the patient had a nearly identical hospitalization in August of this year.  At that time attempts were made to place him within SNF but there were no bed offers. Pt seen and examined. No new complaints.    Assessment and Plan:   Acute alcohol withdrawal with delirium Alcohol associated dementia Delirium and agitation have improved Completed CIWA course. No signs of agitation today.    Mild transaminitis Normalized. No nausea or vomiting.      Irritant contact dermatitis of the [employer] in.  EM Cellulitis has been ruled out Appears to be primarily due to poor hygiene, Antibiotics were discontinued as there was no clear signs of infection. Overall improvement in the erythema and no evidence of frank infection on follow-up exams.   Hyponatremia Stabilized in the absence of alcohol use. Sodium improved to 131.   Hypokalemia and hypomagnesemia Replaced and corrected.    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 Hemoglobin stable between 9 to 10.     Left arm edema Venous duplex ruled out DVT probably secondary to infiltration of IV. Appears to have improved.   Moderate  malnutrition secondary to social and environmental circumstances and alcohol abuse. Supplementation on board   RN Pressure Injury Documentation: Pressure Injury 04/26/22 Back Mid Stage 2 -  Partial thickness loss of dermis presenting as a shallow open injury with a red, pink wound bed without slough. (Active)  04/26/22 1517  Location: Back  Location Orientation: Mid  Staging: Stage 2 -  Partial thickness loss of dermis presenting as a shallow open injury with a red, pink wound bed without slough.  Wound Description (Comments):   Present on Admission: Yes    Malnutrition Type:  Nutrition Problem: Moderate Malnutrition Etiology: social / environmental circumstances (alcohol abuse)   Malnutrition Characteristics:  Signs/Symptoms: mild fat depletion, moderate muscle depletion   Nutrition Interventions:  Interventions: Ensure Enlive (each supplement provides 350kcal and 20 grams of protein), MVI, Juven  Estimated body mass index is 24.41 kg/m as calculated from the following:   Height as of this encounter: 6' (1.829 m).   Weight as of this encounter: 81.6 kg.  Code Status: Full code DVT Prophylaxis:  SCDs Start: 07/27/22 2208   Level of Care: Level of care: Med-Surg Family Communication: None  at bedside  Disposition Plan:     Remains inpatient appropriate:  SNF placement.   Procedures:  None.  Consultants:   None.   Antimicrobials:   Anti-infectives (From admission, onward)    Start     Dose/Rate Route Frequency Ordered Stop   07/27/22 2300  vancomycin (VANCOREADY) IVPB 1500 mg/300 mL  Status:  Discontinued        1,500 mg 150 mL/hr over 120 Minutes Intravenous Every 12 hours 07/27/22 1941 07/28/22 0859   07/27/22 1945  ceFEPIme (MAXIPIME) 2 g in sodium chloride 0.9 % 100 mL IVPB  Status:  Discontinued        2 g 200 mL/hr over 30 Minutes Intravenous Every 8 hours 07/27/22 1941 07/28/22 0858   07/27/22 1530  vancomycin (VANCOCIN) IVPB 1000 mg/200 mL premix         1,000 mg 200 mL/hr over 60 Minutes Intravenous  Once 07/27/22 1525 07/27/22 1736        Medications  Scheduled Meds:  feeding supplement  237 mL Oral BID BM   folic acid  1 mg Oral Daily   multivitamin with minerals  1 tablet Oral Daily   nutrition supplement (JUVEN)  1 packet Oral BID WC   thiamine  100 mg Oral Daily   Continuous Infusions: PRN Meds:.acetaminophen, mouth rinse    Subjective:   Dylan Weaver was seen and examined today. No new complaints today.    Objective:   Vitals:   08/02/22 1247 08/02/22 2032 08/03/22 0415 08/03/22 1300  BP: 92/64 117/79 98/67 97/70   Pulse: 75 74 91 94  Resp: 18 18 18 17   Temp: 98.4 F (36.9 C) 97.9 F (36.6 C) 97.7 F (36.5 C) 98 F (36.7 C)  TempSrc: Oral Oral Oral Oral  SpO2: 97% 100% 97% 99%  Weight:      Height:        Intake/Output Summary (Last 24 hours) at 08/03/2022 1444 Last data filed at 08/03/2022 0640 Gross per 24 hour  Intake 720 ml  Output 1100 ml  Net -380 ml    Filed Weights   07/27/22 1430  Weight: 81.6 kg     Exam General exam: Appears calm and comfortable  Respiratory system: Clear to auscultation. Respiratory effort normal. Cardiovascular system: S1 & S2 heard, RRR. No JVD, No pedal edema. Gastrointestinal system: Abdomen is nondistended, soft and nontender.  Normal bowel sounds heard. Central nervous system: Alert and oriented. No focal neurological deficits. Extremities: Symmetric 5 x 5 power. Skin: No rashes, lesions or ulcers Psychiatry: Mood & affect appropriate.    Data Reviewed:  I have personally reviewed following labs and imaging studies   CBC Lab Results  Component Value Date   WBC 10.2 07/31/2022   RBC 3.36 (L) 07/31/2022   HGB 10.7 (L) 07/31/2022   HCT 31.6 (L) 07/31/2022   MCV 94.0 07/31/2022   MCH 31.8 07/31/2022   PLT 223 07/31/2022   MCHC 33.9 07/31/2022   RDW 15.5 07/31/2022   LYMPHSABS 1.5 07/27/2022   MONOABS 0.5 07/27/2022   EOSABS 0.1 07/27/2022    BASOSABS 0.1 07/27/2022     Last metabolic panel Lab Results  Component Value Date   NA 131 (L) 08/01/2022   K 4.6 08/01/2022   CL 102 08/01/2022   CO2 21 (L) 08/01/2022   BUN 10 08/01/2022   CREATININE 0.57 (L) 08/01/2022   GLUCOSE 82 08/01/2022   GFRNONAA >60 08/01/2022   CALCIUM 9.2 08/01/2022   PHOS 2.6 07/30/2022  PROT 6.9 07/30/2022   ALBUMIN 2.5 (L) 07/30/2022   BILITOT 1.0 07/30/2022   ALKPHOS 167 (H) 07/30/2022   AST 64 (H) 07/30/2022   ALT 43 07/30/2022   ANIONGAP 8 08/01/2022    CBG (last 3)  No results for input(s): "GLUCAP" in the last 72 hours.    Coagulation Profile: Recent Labs  Lab 07/27/22 1550 07/28/22 0527  INR 1.0 1.1      Radiology Studies: No results found.     Kathlen Mody M.D. [facility] 08/03/2022, 2:44 PM  Available via Epic secure chat 7am-7pm After 7 pm, please refer to night coverage provider listed on amion.

## 2022-08-04 DIAGNOSIS — L8990 Pressure ulcer of unspecified site, unspecified stage: Secondary | ICD-10-CM | POA: Insufficient documentation

## 2022-08-04 NOTE — Progress Notes (Signed)
Nutrition Follow-up  DOCUMENTATION CODES:   Non-severe (moderate) malnutrition in context of social or environmental circumstances  INTERVENTION:   -Ensure Plus High Protein po BID, each supplement provides 350 kcal and 20 grams of protein.    -Multivitamin with minerals daily   -1 packet Juven BID, each packet provides 95 calories, 2.5 grams of protein (collagen), and 9.8 grams of carbohydrate (3 grams sugar); also contains 7 grams of L-arginine and L-glutamine, 300 mg vitamin C, 15 mg vitamin E, 1.2 mcg vitamin B-12, 9.5 mg zinc, 200 mg calcium, and 1.5 g  Calcium Beta-hydroxy-Beta-methylbutyrate to support wound healing    NUTRITION DIAGNOSIS:   Moderate Malnutrition related to social / environmental circumstances (alcohol abuse) as evidenced by mild fat depletion, moderate muscle depletion.  Ongoing.  GOAL:   Patient will meet greater than or equal to 90% of their needs  Progressing.  MONITOR:   PO intake, Supplement acceptance, Labs, Weight trends, I & O's, Skin  REASON FOR ASSESSMENT:   Consult Assessment of nutrition requirement/status  ASSESSMENT:   56 y.o. male with medical history significant of  Alcohol abuse, history of Enterococcus bacteremia and ulcers in the past, Admitted for Alcohol withdrawal syndrome with perceptual disturbance,  Cellulitis of buttock and Hypokalemia.  Patient currently consuming 75-100% of meals. Accepting all protein and vitamin supplements as well.   Admission weight: 180 lbs No new weights.  Medications: Folic acid, Multivitamin with minerals daily, Thiamine  Labs reviewed: Low Na Low iron  Diet Order:   Diet Order             Diet regular Room service appropriate? Yes; Fluid consistency: Thin  Diet effective now                   EDUCATION NEEDS:   Not appropriate for education at this time  Skin:  Skin Assessment: Skin Integrity Issues: Skin Integrity Issues:: Other (Comment) Other: cellulitis of right  buttocks, groin  Last BM:  PTA  Height:   Ht Readings from Last 1 Encounters:  07/27/22 6' (1.829 m)    Weight:   Wt Readings from Last 1 Encounters:  07/27/22 81.6 kg    BMI:  Body mass index is 24.41 kg/m.  Estimated Nutritional Needs:   Kcal:  2400-2600  Protein:  120-130g  Fluid:  2.4L/day  Clayton Bibles, MS, RD, LDN Inpatient Clinical Dietitian Contact information available via Amion

## 2022-08-04 NOTE — NC FL2 (Signed)
[place] LEVEL OF CARE SCREENING TOOL     IDENTIFICATION  Patient Name: Dylan Weaver Birthdate: 18-May-1966 Sex: male Admission Date (Current Location): 07/27/2022  [facility] and [place] Number:  Herbalist and Address:  [facility],  [place], [place]      Provider Number: [id number]  Attending Physician Name and Address:  Hosie Poisson, MD  Relative Name and Phone Number:   [facility] [place](Aunt )[phone])    Current Level of Care: Hospital Recommended Level of Care: [place] Prior Approval Number:    Date Approved/Denied:   PASRR Number:  [id number])  Discharge Plan: SNF    Current Diagnoses: Patient Active Problem List   Diagnosis Date Noted   Cellulitis 07/27/2022   Hypophosphatemia 04/29/2022   Enteropathogenic Escherichia coli infection 04/27/2022   Hypokalemia 04/25/2022   Aspiration pneumonia ([place]) 04/25/2022   Debility 54/27/0623   Acute metabolic encephalopathy 76/28/3151   Enterococcal bacteremia    Pressure ulcer 10/24/2021   Irritant contact dermatitis due to incontinence of both feces and urine 10/22/2021   Rhabdomyolysis 10/22/2021   Sepsis with acute organ dysfunction without septic shock due to bilateral thigh/pubic area cellulitis and enterococcal bacteremia 10/22/2021   Hypomagnesemia 08/31/2021   Hepatic steatosis 08/31/2021   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 breakdown of skin ([place])    Tobacco abuse 08/26/2021   Infection of right foot with necrosis 08/26/2021   Foot infection 08/26/2021   Hyponatremia    Decompensated hepatic cirrhosis ([place]) 08/25/2021   Protein-calorie malnutrition, severe 06/14/2021   Elevated LFTs 11/29/2020   Intertrigo 11/29/2020   QT prolongation 11/29/2020   Malnutrition of moderate degree 09/16/2020   Alcohol withdrawal ([place]) 76/16/0737   Alcoholic hepatitis 10/62/6948   Thrombocytopenia ([place])  09/14/2020   Normochromic normocytic anemia 09/14/2020   Alcohol use disorder with alcohol withdrawal  06/19/2020   Alcohol abuse with intoxication ([place]) 06/19/2020    Orientation RESPIRATION BLADDER Height & Weight     Self, Time, Situation, Place  Normal Continent Weight: 180 lb (81.6 kg) Height:  6' (182.9 cm)  BEHAVIORAL SYMPTOMS/MOOD NEUROLOGICAL BOWEL NUTRITION STATUS      Continent Diet (Regular)  AMBULATORY STATUS COMMUNICATION OF NEEDS Skin   Limited Assist Verbally Other (Comment), PU Stage and Appropriate Care (diffuse areas-barrier cream daily;prn)                       Personal Care Assistance Level of Assistance  Bathing, Feeding, Dressing Bathing Assistance: Limited assistance Feeding assistance: Limited assistance Dressing Assistance: Limited assistance     Functional Limitations Info  Sight, Hearing, Speech Sight Info: Adequate Hearing Info: Adequate Speech Info: Adequate    SPECIAL CARE FACTORS FREQUENCY  PT (By licensed PT), OT (By licensed OT)     PT Frequency:  (5x week) OT Frequency:  (5x week)            Contractures Contractures Info: Not present    Additional Factors Info  Code Status, Allergies Code Status Info:  (Full) Allergies Info:  (Midodrine)           Current Medications (08/04/2022):  This is the current hospital active medication list Current Facility-Administered Medications  Medication Dose Route Frequency Provider Last Rate Last Admin   acetaminophen (TYLENOL) tablet 500 mg  500 mg Oral Q6H PRN Cherene Altes, MD   500 mg at 08/02/22 0754   feeding supplement (ENSURE ENLIVE /  ENSURE PLUS) liquid 237 mL  237 mL Oral BID BM Cherene Altes, MD   237 mL at [id number]   folic acid (FOLVITE) tablet 1 mg  1 mg Oral Daily Doutova, Anastassia, MD   1 mg at 08/04/22 1019   multivitamin with minerals tablet 1 tablet  1 tablet Oral Daily Toy Baker, MD   1 tablet at 08/04/22 1019   nutrition supplement  (JUVEN) (JUVEN) powder packet 1 packet  1 packet Oral BID WC Cherene Altes, MD   1 packet at 08/04/22 1016   Oral care mouth rinse  15 mL Mouth Rinse PRN Cherene Altes, MD       thiamine (VITAMIN B1) tablet 100 mg  100 mg Oral Daily Doutova, Anastassia, MD   100 mg at 08/04/22 1020     Discharge Medications: Please see discharge summary for a list of discharge medications.  Relevant Imaging Results:  Relevant Lab Results:   Additional Information  (SS#[phone])  Elowyn Raupp, LCSW

## 2022-08-04 NOTE — TOC Progression Note (Signed)
Transition of Care [facility]) - Progression Note    Patient Details  Name: Dylan Weaver MRN: [id number] Date of Birth: December 26, 1965  Transition of Care [facility]) CM/SW Contact  Lennart Pall, LCSW Phone Number: 08/04/2022, 11:27 AM  Clinical Narrative:    Have submitted SNF referral to all approved [place] facilities.   Expected Discharge Plan: [place] Barriers to Discharge: Continued Medical Work up  Expected Discharge Plan and Services Expected Discharge Plan: [place]   Discharge Planning Services: CM Consult Post Acute Care Choice: [place] arrangements for the past 2 months: [place]: [place] ([place]) Date [place]: 07/28/22 Time [place]: [id number] Representative spoke with at [place]:  Caryl Pina)   Social Determinants of Health ([place]) Interventions    Readmission Risk Interventions    07/28/2022    2:36 PM  Readmission Risk Prevention Plan  Transportation Screening Complete  PCP or Specialist Appt within 3-5 Days Complete  HRI or [place] Complete  Social Work Consult for [place] Planning/Counseling Complete  Palliative Care Screening Not Applicable  Medication Review Press photographer) Complete

## 2022-08-04 NOTE — Progress Notes (Signed)
[facility]                                                                               [name], is a 56 y.o. male, DOB - 03-18-1966, [name]:[id number] Admit date - 07/27/2022    Outpatient Primary MD for the patient is [facility], [place]  LOS - 8  days    Brief summary   56 year old with a history of alcohol abuse who presented to the ED with complaint of pain in his buttocks but was found to be acutely confused and an apparent acute alcohol withdrawal after having abstained for 3 days.  Upon arrival he was covered in feces and quite disheveled.  CT pelvis in the ER revealed no evidence of abscess or free air within the soft tissues of the buttocks.  CT head was unrevealing.  Of note the patient had a nearly identical hospitalization in August of this year.  At that time attempts were made to place him within SNF but there were no bed offers. Pt seen and examined. No new complaints.    Assessment and Plan:   Acute alcohol withdrawal with delirium Alcohol associated dementia Delirium and agitation have improved Completed CIWA course. No signs of agitation today. No signs of withdrawal. He is pleasant and comfortable.    Mild transaminitis Normalized. No nausea or vomiting.      Irritant contact dermatitis of the [employer] in.  EM Cellulitis has been ruled out Appears to be primarily due to poor hygiene, Antibiotics were discontinued as there was no clear signs of infection. Overall improvement in the erythema and no evidence of frank infection on follow-up exams.   Hyponatremia Stabilized in the absence of alcohol use. Sodium improved to 131.   Hypokalemia and hypomagnesemia Replaced and corrected.    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 Hemoglobin stable between 9 to 10.     Left arm edema Venous duplex ruled out DVT probably secondary to infiltration of  IV. Appears to have improved.   Moderate malnutrition secondary to social and environmental circumstances and alcohol abuse. Supplementation on board   RN Pressure Injury Documentation: Pressure Injury 04/26/22 Back Mid Stage 2 -  Partial thickness loss of dermis presenting as a shallow open injury with a red, pink wound bed without slough. (Active)  04/26/22 1517  Location: Back  Location Orientation: Mid  Staging: Stage 2 -  Partial thickness loss of dermis presenting as a shallow open injury with a red, pink wound bed without slough.  Wound Description (Comments):   Present on Admission: Yes     Pressure Injury 08/03/22 Vertebral column Medial Stage 2 -  Partial thickness loss of dermis presenting as a shallow open injury with a red, pink wound bed without slough. (Active)  08/03/22 2221  Location: Vertebral column  Location Orientation: Medial  Staging: Stage 2 -  Partial thickness loss of dermis presenting as a shallow open injury with a red, pink wound bed without slough.  Wound Description (Comments):   Present on Admission:   Dressing Type Foam - Lift dressing to assess site every shift 08/04/22 0530  Foam dressing.    Malnutrition Type:  Nutrition Problem: Moderate Malnutrition Etiology: social / environmental circumstances (alcohol abuse)   Malnutrition Characteristics:  Signs/Symptoms: mild fat depletion, moderate muscle depletion   Nutrition Interventions:  Interventions: Ensure Enlive (each supplement provides 350kcal and 20 grams of protein), MVI, Juven  Estimated body mass index is 24.41 kg/m as calculated from the following:   Height as of this encounter: 6' (1.829 m).   Weight as of this encounter: 81.6 kg.  Code Status: Full code DVT Prophylaxis:  SCDs Start: 07/27/22 2208   Level of Care: Level of care: Med-Surg Family Communication: None at bedside  Disposition Plan:     Remains inpatient appropriate:  SNF placement.   Procedures:  None.   Consultants:   None.   Antimicrobials:   Anti-infectives (From admission, onward)    Start     Dose/Rate Route Frequency Ordered Stop   07/27/22 2300  vancomycin (VANCOREADY) IVPB 1500 mg/300 mL  Status:  Discontinued        1,500 mg 150 mL/hr over 120 Minutes Intravenous Every 12 hours 07/27/22 1941 07/28/22 0859   07/27/22 1945  ceFEPIme (MAXIPIME) 2 g in sodium chloride 0.9 % 100 mL IVPB  Status:  Discontinued        2 g 200 mL/hr over 30 Minutes Intravenous Every 8 hours 07/27/22 1941 07/28/22 0858   07/27/22 1530  vancomycin (VANCOCIN) IVPB 1000 mg/200 mL premix        1,000 mg 200 mL/hr over 60 Minutes Intravenous  Once 07/27/22 1525 07/27/22 1736        Medications  Scheduled Meds:  feeding supplement  237 mL Oral BID BM   folic acid  1 mg Oral Daily   multivitamin with minerals  1 tablet Oral Daily   nutrition supplement (JUVEN)  1 packet Oral BID WC   thiamine  100 mg Oral Daily   Continuous Infusions: PRN Meds:.acetaminophen, mouth rinse    Subjective:   Dylan Weaver was seen and examined today. No new complaints today.   Objective:   Vitals:   08/03/22 0415 08/03/22 1300 08/03/22 2251 08/04/22 0519  BP: 98/67 97/70 97/68  97/63  Pulse: 91 94 77 71  Resp: 18 17 17 17   Temp: 97.7 F (36.5 C) 98 F (36.7 C) 98.4 F (36.9 C) 98.9 F (37.2 C)  TempSrc: Oral Oral Oral Oral  SpO2: 97% 99% 98% 98%  Weight:      Height:        Intake/Output Summary (Last 24 hours) at 08/04/2022 1123 Last data filed at 08/04/2022 1016 Gross per 24 hour  Intake 930 ml  Output 1750 ml  Net -820 ml    Filed Weights   07/27/22 1430  Weight: 81.6 kg     Exam General exam: Appears calm and comfortable  Respiratory system: Clear to auscultation. Respiratory effort normal. Cardiovascular system: S1 & S2 heard, RRR. No JVD,  No pedal edema. Gastrointestinal system: Abdomen is nondistended, soft and nontender.  Normal bowel sounds heard. Central nervous system: Alert  and oriented. No focal neurological deficits. Extremities: Symmetric 5 x 5 power. Skin: No rashes, lesions or ulcers Psychiatry:  Mood & affect appropriate.     Data Reviewed:  I have personally reviewed following labs and imaging studies   CBC Lab Results  Component Value Date   WBC 10.2 07/31/2022   RBC 3.36 (L) 07/31/2022   HGB 10.7 (L) 07/31/2022   HCT 31.6 (L) 07/31/2022   MCV 94.0 07/31/2022  MCH 31.8 07/31/2022   PLT 223 07/31/2022   MCHC 33.9 07/31/2022   RDW 15.5 07/31/2022   LYMPHSABS 1.5 07/27/2022   MONOABS 0.5 07/27/2022   EOSABS 0.1 07/27/2022   BASOSABS 0.1 07/27/2022     Last metabolic panel Lab Results  Component Value Date   NA 131 (L) 08/01/2022   K 4.6 08/01/2022   CL 102 08/01/2022   CO2 21 (L) 08/01/2022   BUN 10 08/01/2022   CREATININE 0.57 (L) 08/01/2022   GLUCOSE 82 08/01/2022   GFRNONAA >60 08/01/2022   CALCIUM 9.2 08/01/2022   PHOS 2.6 07/30/2022   PROT 6.9 07/30/2022   ALBUMIN 2.5 (L) 07/30/2022   BILITOT 1.0 07/30/2022   ALKPHOS 167 (H) 07/30/2022   AST 64 (H) 07/30/2022   ALT 43 07/30/2022   ANIONGAP 8 08/01/2022    CBG (last 3)  No results for input(s): "GLUCAP" in the last 72 hours.    Coagulation Profile: No results for input(s): "INR", "PROTIME" in the last 168 hours.    Radiology Studies: No results found.     Kathlen Mody M.D. [facility] 08/04/2022, 11:23 AM  Available via Epic secure chat 7am-7pm After 7 pm, please refer to night coverage provider listed on amion.

## 2022-08-04 NOTE — Plan of Care (Signed)
  Problem: Clinical Measurements: Goal: Diagnostic test results will improve Outcome: Progressing   

## 2022-08-04 NOTE — Progress Notes (Cosign Needed Addendum)
Physical Therapy Treatment Patient Details Name: Dylan Weaver MRN: [id number] DOB: 1966/04/25 Today's Date: 08/04/2022   History of Present Illness Dylan Weaver is a 56 y.o. male with medical history significant of  alcohol abuse, history of enterococcus bacteremia and ulcers in the past.  Presented with buttock pain. Patient with known history of alcohol abuse states that he drove here from of [place] patient is actively hallucinating admits to drinking at least 6-12 beers per day have not been having any alcohol for past 3 days.  Reports that he has sustained a fall on his buttocks. Was noted to be covered in feces and disheveled.  There is redness of his buttocks consistent with likely cellulitis.  Patient is hallucinating stating that he has slipped on ice  Admits to alcohol abuse    PT Comments    Patient declined ambulation but agreed to transfer to recliner in order to clean the bed. Confused but able to follow 1 step commands. Required HHA for bed mobility to pull himself upright and increased time. Needed mod assist for transfers. Instructed patient on safe transfers into recliner but patient still lacked control for stand to sit (plopped down). Physical therapy recommended SNF.   Recommendations for follow up therapy are one component of a multi-disciplinary discharge planning process, led by the attending physician.  Recommendations may be updated based on patient status, additional functional criteria and insurance authorization.  Follow Up Recommendations  Skilled nursing-short term rehab (<3 hours/day) Can patient physically be transported by private vehicle: No   Assistance Recommended at Discharge Frequent or constant Supervision/Assistance  Patient can return home with the following Help with stairs or ramp for entrance;Assist for transportation;Assistance with cooking/housework;A lot of help with bathing/dressing/bathroom;Two people to help with walking  and/or transfers   Equipment Recommendations  Rolling walker (2 wheels)    Recommendations for Other Services       Precautions / Restrictions Precautions Precautions: Fall Restrictions Weight Bearing Restrictions: No     Mobility  Bed Mobility Overal bed mobility: Needs Assistance Bed Mobility: Supine to Sit     Supine to sit: Min assist, HOB elevated     General bed mobility comments: Required increased time and HHA to pull himself upright.    Transfers Overall transfer level: Needs assistance Equipment used: Rolling walker (2 wheels) Transfers: Sit to/from Stand, Bed to chair/wheelchair/BSC Sit to Stand: Mod assist   Step pivot transfers: Mod assist            Ambulation/Gait                   Stairs             Wheelchair Mobility    Modified Rankin (Stroke Patients Only)       Balance                                            Cognition Arousal/Alertness: Awake/alert Behavior During Therapy: WFL for tasks assessed/performed Overall Cognitive Status: Impaired/Different from baseline Area of Impairment: Orientation, Attention, Memory, Following commands, Safety/judgement, Awareness, Problem solving                 Orientation Level: Disoriented to, Place, Situation, Time Current Attention Level: Sustained Memory: Decreased recall of precautions, Decreased short-term memory Following Commands: Follows one step commands consistently, Follows one step commands with increased time Safety/Judgement: Decreased  awareness of safety, Decreased awareness of deficits Awareness: Emergent Problem Solving: Slow processing, Decreased initiation, Difficulty sequencing, Requires verbal cues General Comments: Confused but able to follow 1 step commands.        Exercises      General Comments        Pertinent Vitals/Pain Pain Assessment Pain Assessment: No/denies pain Pain Intervention(s): Monitored during  session    Home Living                          Prior Function            PT Goals (current goals can now be found in the care plan section) Acute Rehab PT Goals Patient Stated Goal: none stated PT Goal Formulation: With patient Time For Goal Achievement: 08/12/22 Potential to Achieve Goals: Fair Progress towards PT goals: Progressing toward goals    Frequency    Min 2X/week      PT Plan Current plan remains appropriate    Co-evaluation              AM-PAC PT "6 Clicks" Mobility   Outcome Measure  Help needed turning from your back to your side while in a flat bed without using bedrails?: A Lot Help needed moving from lying on your back to sitting on the side of a flat bed without using bedrails?: A Lot Help needed moving to and from a bed to a chair (including a wheelchair)?: A Lot Help needed standing up from a chair using your arms (e.g., wheelchair or bedside chair)?: A Lot Help needed to walk in hospital room?: A Lot Help needed climbing 3-5 steps with a railing? : Total 6 Click Score: 11    End of Session Equipment Utilized During Treatment: Gait belt Activity Tolerance: Patient tolerated treatment well Patient left: in chair;with call bell/phone within reach;with chair alarm set Nurse Communication: Mobility status PT Visit Diagnosis: Difficulty in walking, not elsewhere classified (R26.2);History of falling (Z91.81)     Time: 1425-1440 PT Time Calculation (min) (ACUTE ONLY): 15 min  Charges:  $Therapeutic Activity: 8-22 mins                        Alick Lecomte 08/04/2022, 4:04 PM

## 2022-08-04 NOTE — Progress Notes (Signed)
PHYSICAL THERAPY  Upon entering room, pt was attempting to order a pizza and a bottle of wine from [facility] app to be delivered to the hospital.  Redirected/educated and reported to RN.  Rica Koyanagi  PTA Acute  Rehabilitation Services Office M-F          [phone] Weekend pager [phone]

## 2022-08-05 MED ORDER — SODIUM CHLORIDE 0.9 % IV SOLN: INTRAVENOUS | Status: DC

## 2022-08-05 NOTE — Progress Notes (Signed)
[facility]                                                                               Dylan Weaver, is a 56 y.o. male, DOB - [birth year], GEZ:[id number] Admit date - 07/27/2022    Outpatient Primary MD for the patient is [facility]  LOS - 9  days    Brief summary   56 year old with a history of alcohol abuse who presented to the ED with complaint of pain in his buttocks but was found to be acutely confused and an apparent acute alcohol withdrawal after having abstained for 3 days.  Upon arrival he was covered in feces and quite disheveled.  CT pelvis in the ER revealed no evidence of abscess or free air within the soft tissues of the buttocks.  CT head was unrevealing.  Of note the patient had a nearly identical hospitalization in August of this year.  At that time attempts were made to place him within SNF but there were no bed offers. Pt seen and examined. No new complaints.    Assessment and Plan:   Acute alcohol withdrawal with delirium Alcohol associated dementia Delirium and agitation have improved Completed CIWA course. No signs of agitation today. No signs of withdrawal. He is pleasant and comfortable.    Mild transaminitis No nausea or vomiting.      Irritant contact dermatitis of the perineum  Cellulitis has been ruled out Appears to be primarily due to poor hygiene, Antibiotics were discontinued as there was no clear signs of infection. Overall improvement in the erythema and no evidence of frank infection on follow-up exams.   Hyponatremia Stabilized in the absence of alcohol use. Sodium improved to 131. Repeat labs in am.    Hypokalemia and hypomagnesemia Replaced and corrected.    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 Hemoglobin stable between 9 to 10.     Left arm edema Venous duplex ruled out DVT probably secondary to infiltration  of IV. Appears to have improved.    Moderate malnutrition secondary to social and environmental circumstances and alcohol abuse. Supplementation on board.   Minimal urine output:  Start the patient on IV fluids and get labs.    RN Pressure Injury Documentation: Pressure Injury 04/26/22 Back Mid Stage 2 -  Partial thickness loss of dermis presenting as a shallow open injury with a red, pink wound bed without slough. (Active)  04/26/22 1517  Location: Back  Location Orientation: Mid  Staging: Stage 2 -  Partial thickness loss of dermis presenting as a shallow open injury with a red, pink wound bed without slough.  Wound Description (Comments):   Present on Admission: Yes     Pressure Injury 08/03/22 Vertebral column Medial Stage 2 -  Partial thickness loss of dermis presenting as a shallow open injury with a red, pink wound bed without slough. (Active)  08/03/22 2221  Location: Vertebral column  Location Orientation: Medial  Staging: Stage 2 -  Partial thickness loss of dermis presenting as a shallow open injury with a red, pink wound bed without slough.  Wound Description (Comments):  Present on Admission:   Dressing Type Foam - Lift dressing to assess site every shift 08/05/22 0900   Foam dressing.    Malnutrition Type:  Nutrition Problem: Moderate Malnutrition Etiology: social / environmental circumstances (alcohol abuse)   Malnutrition Characteristics:  Signs/Symptoms: mild fat depletion, moderate muscle depletion   Nutrition Interventions:  Interventions: Ensure Enlive (each supplement provides 350kcal and 20 grams of protein), MVI, Juven  Estimated body mass index is 24.41 kg/m as calculated from the following:   Height as of this encounter: 6' (1.829 m).   Weight as of this encounter: 81.6 kg.  Code Status: Full code DVT Prophylaxis:  SCDs Start: 07/27/22 2208   Level of Care: Level of care: Med-Surg Family Communication: None at bedside  Disposition  Plan:     Remains inpatient appropriate:  SNF placement.   Procedures:  None.  Consultants:   None.   Antimicrobials:   Anti-infectives (From admission, onward)    Start     Dose/Rate Route Frequency Ordered Stop   07/27/22 2300  vancomycin (VANCOREADY) IVPB 1500 mg/300 mL  Status:  Discontinued        1,500 mg 150 mL/hr over 120 Minutes Intravenous Every 12 hours 07/27/22 1941 07/28/22 0859   07/27/22 1945  ceFEPIme (MAXIPIME) 2 g in sodium chloride 0.9 % 100 mL IVPB  Status:  Discontinued        2 g 200 mL/hr over 30 Minutes Intravenous Every 8 hours 07/27/22 1941 07/28/22 0858   07/27/22 1530  vancomycin (VANCOCIN) IVPB 1000 mg/200 mL premix        1,000 mg 200 mL/hr over 60 Minutes Intravenous  Once 07/27/22 1525 07/27/22 1736        Medications  Scheduled Meds:  feeding supplement  237 mL Oral BID BM   folic acid  1 mg Oral Daily   multivitamin with minerals  1 tablet Oral Daily   nutrition supplement (JUVEN)  1 packet Oral BID WC   thiamine  100 mg Oral Daily   Continuous Infusions:  sodium chloride 100 mL/hr at 08/05/22 1412   PRN Meds:.acetaminophen, mouth rinse    Subjective:   Dylan Weaver was seen and examined today. Minimal urine output today.    Objective:   Vitals:   08/04/22 1904 08/04/22 2201 08/05/22 0544 08/05/22 1340  BP: 91/61 (!) 93/56 92/63 95/60   Pulse:  75 65 75  Resp:  18 18 17   Temp:  98.1 F (36.7 C) 97.7 F (36.5 C) 98.5 F (36.9 C)  TempSrc:  Oral Oral Oral  SpO2:  99% 100%   Weight:      Height:        Intake/Output Summary (Last 24 hours) at 08/05/2022 1546 Last data filed at 08/05/2022 1412 Gross per 24 hour  Intake 720 ml  Output 450 ml  Net 270 ml    Filed Weights   07/27/22 1430  Weight: 81.6 kg     Exam General exam: Appears calm and comfortable  Respiratory system: Clear to auscultation. Respiratory effort normal. Cardiovascular system: S1 & S2 heard, RRR. No JVD, No pedal edema. Gastrointestinal  system: Abdomen is nondistended, soft and nontender.Normal bowel sounds heard. Central nervous system: Alert and oriented. No focal neurological deficits. Extremities: Symmetric 5 x 5 power. Skin: No rashes, lesions or ulcers Psychiatry:  Mood & affect appropriate.      Data Reviewed:  I have personally reviewed following labs and imaging studies   CBC Lab Results  Component Value Date  WBC 10.2 07/31/2022   RBC 3.36 (L) 07/31/2022   HGB 10.7 (L) 07/31/2022   HCT 31.6 (L) 07/31/2022   MCV 94.0 07/31/2022   MCH 31.8 07/31/2022   PLT 223 07/31/2022   MCHC 33.9 07/31/2022   RDW 15.5 07/31/2022   LYMPHSABS 1.5 07/27/2022   MONOABS 0.5 07/27/2022   EOSABS 0.1 07/27/2022   BASOSABS 0.1 86/57/8469     Last metabolic panel Lab Results  Component Value Date   NA 131 (L) 08/01/2022   K 4.6 08/01/2022   CL 102 08/01/2022   CO2 21 (L) 08/01/2022   BUN 10 08/01/2022   CREATININE 0.57 (L) 08/01/2022   GLUCOSE 82 08/01/2022   GFRNONAA >60 08/01/2022   CALCIUM 9.2 08/01/2022   PHOS 2.6 07/30/2022   PROT 6.9 07/30/2022   ALBUMIN 2.5 (L) 07/30/2022   BILITOT 1.0 07/30/2022   ALKPHOS 167 (H) 07/30/2022   AST 64 (H) 07/30/2022   ALT 43 07/30/2022   ANIONGAP 8 08/01/2022    CBG (last 3)  No results for input(s): "GLUCAP" in the last 72 hours.    Coagulation Profile: No results for input(s): "INR", "PROTIME" in the last 168 hours.    Radiology Studies: No results found.     Hosie Poisson M.D. [facility] 08/05/2022, 3:46 PM  Available via Epic secure chat 7am-7pm After 7 pm, please refer to night coverage provider listed on amion.

## 2022-08-05 NOTE — Plan of Care (Signed)
  Problem: Education: Goal: Knowledge of General Education information will improve Description Including pain rating scale, medication(s)/side effects and non-pharmacologic comfort measures Outcome: Progressing   Problem: Activity: Goal: Risk for activity intolerance will decrease Outcome: Progressing   Problem: Safety: Goal: Ability to remain free from injury will improve Outcome: Progressing   

## 2022-08-05 NOTE — TOC Progression Note (Signed)
Transition of Care [facility]) - Progression Note    Patient Details  Name: Dylan Weaver MRN: [id number] Date of Birth: 03-04-1966  Transition of Care [facility]) CM/SW Contact  [place], [place], [place] Phone Number: 08/05/2022, 11:19 AM  Clinical Narrative:     Still awaiting bed offers for [facility] contracted SNF.  Expected Discharge Plan: [place] Barriers to Discharge: Continued Medical Work up  Expected Discharge Plan and Services Expected Discharge Plan: [place]   Discharge Planning Services: CM Consult Post Acute Care Choice: [place] arrangements for the past 2 months: [place]: [place] ([place]) Date [place]: 07/28/22 Time [place]: 8110 Representative spoke with at [place]:  Caryl Pina)   Social Determinants of Health ([place]) Interventions    Readmission Risk Interventions    07/28/2022    2:36 PM  Readmission Risk Prevention Plan  Transportation Screening Complete  PCP or Specialist Appt within 3-5 Days Complete  HRI or [place] Complete  Social Work Consult for [place] Planning/Counseling Complete  Palliative Care Screening Not Applicable  Medication Review Press photographer) Complete

## 2022-08-06 LAB — BASIC METABOLIC PANEL
Anion gap: 6 (ref 5–15)
BUN: 12 mg/dL (ref 6–20)
CO2: 21 mmol/L — ABNORMAL LOW (ref 22–32)
Calcium: 8.3 mg/dL — ABNORMAL LOW (ref 8.9–10.3)
Chloride: 104 mmol/L (ref 98–111)
Creatinine, Ser: 0.61 mg/dL (ref 0.61–1.24)
GFR, Estimated: >60 mL/min (ref >60)
Glucose, Bld: 93 mg/dL (ref 70–99)
Potassium: 4.1 mmol/L (ref 3.5–5.1)
Sodium: 131 mmol/L — ABNORMAL LOW (ref 135–145)

## 2022-08-06 LAB — CBC WITH DIFFERENTIAL/PLATELET
Abs Immature Granulocytes: 0.03 10*3/uL (ref 0.00–0.07)
Basophils Absolute: 0.1 10*3/uL (ref 0.0–0.1)
Basophils Relative: 1 %
Eosinophils Absolute: 0.3 10*3/uL (ref 0.0–0.5)
Eosinophils Relative: 3 %
HCT: 33 % — ABNORMAL LOW (ref 39.0–52.0)
Hemoglobin: 10.6 g/dL — ABNORMAL LOW (ref 13.0–17.0)
Immature Granulocytes: 0 %
Lymphocytes Relative: 23 %
Lymphs Abs: 2.1 10*3/uL (ref 0.7–4.0)
MCH: 31.2 pg (ref 26.0–34.0)
MCHC: 32.1 g/dL (ref 30.0–36.0)
MCV: 97.1 fL (ref 80.0–100.0)
Monocytes Absolute: 0.7 10*3/uL (ref 0.1–1.0)
Monocytes Relative: 8 %
Neutro Abs: 5.9 10*3/uL (ref 1.7–7.7)
Neutrophils Relative %: 65 %
Platelets: 254 10*3/uL (ref 150–400)
RBC: 3.4 MIL/uL — ABNORMAL LOW (ref 4.22–5.81)
RDW: 16.2 % — ABNORMAL HIGH (ref 11.5–15.5)
WBC: 9 10*3/uL (ref 4.0–10.5)
nRBC: 0 % (ref 0.0–0.2)

## 2022-08-06 NOTE — Progress Notes (Signed)
[facility]                                                                               Dylan Weaver, is a 56 y.o. male, DOB - 1966/07/19, URK:[id number] Admit date - 07/27/2022    Outpatient Primary [facility] for the patient is [facility]  LOS - 10  days    Brief summary   56 year old with a history of alcohol abuse who presented to the ED with complaint of pain in his buttocks but was found to be acutely confused and an apparent acute alcohol withdrawal after having abstained for 3 days.  Upon arrival he was covered in feces and quite disheveled.  CT pelvis in the ER revealed no evidence of abscess or free air within the soft tissues of the buttocks.  CT head was unrevealing.  Of note the patient had a nearly identical hospitalization in August of this year.  At that time attempts were made to place him within SNF but there were no bed offers. Pt seen and examined. No new complaints.  Waiting for SNF.    Assessment and Plan:   Acute alcohol withdrawal with delirium Alcohol associated dementia Delirium and agitation have improved Completed CIWA course. No signs of agitation today. No signs of withdrawal. He is pleasant and comfortable.    Mild transaminitis No nausea or vomiting.      Irritant contact dermatitis of the perineum  Cellulitis has been ruled out Appears to be primarily due to poor hygiene, Antibiotics were discontinued as there was no clear signs of infection. Overall improvement in the erythema and no evidence of frank infection on follow-up exams.   Hyponatremia Stabilized in the absence of alcohol use. Sodium improved to 131. REPEAT LABS show stable sodium at 131. Looks like this is his baseline.    Hypokalemia and hypomagnesemia Replaced and corrected.    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 Hemoglobin stable between 9 to 10.     Left  arm edema Venous duplex ruled out DVT probably secondary to infiltration of IV. Appears to have improved.    Moderate malnutrition secondary to social and environmental circumstances and alcohol abuse. Supplementation on board.   Minimal urine output:  Start the patient on IV fluids  Urine output improved today.    RN Pressure Injury Documentation: Pressure Injury 04/26/22 Back Mid Stage 2 -  Partial thickness loss of dermis presenting as a shallow open injury with a red, pink wound bed without slough. (Active)  04/26/22 1517  Location: Back  Location Orientation: Mid  Staging: Stage 2 -  Partial thickness loss of dermis presenting as a shallow open injury with a red, pink wound bed without slough.  Wound Description (Comments):   Present on Admission: Yes     Pressure Injury 08/03/22 Vertebral column Medial Stage 2 -  Partial thickness loss of dermis presenting as a shallow open injury with a red, pink wound bed without slough. (Active)  08/03/22 2221  Location: Vertebral column  Location Orientation: Medial  Staging: Stage 2 -  Partial thickness loss of dermis presenting as a shallow open  injury with a red, pink wound bed without slough.  Wound Description (Comments):   Present on Admission:   Dressing Type Foam - Lift dressing to assess site every shift 08/06/22 0825   Foam dressing.    Malnutrition Type:  Nutrition Problem: Moderate Malnutrition Etiology: social / environmental circumstances (alcohol abuse)   Malnutrition Characteristics:  Signs/Symptoms: mild fat depletion, moderate muscle depletion   Nutrition Interventions:  Interventions: Ensure Enlive (each supplement provides 350kcal and 20 grams of protein), MVI, Juven  Estimated body mass index is 24.41 kg/m as calculated from the following:   Height as of this encounter: 6' (1.829 m).   Weight as of this encounter: 81.6 kg.  Code Status: Full code DVT Prophylaxis:  SCDs Start: 07/27/22  2208   Level of Care: Level of care: Med-Surg Family Communication: None at bedside  Disposition Plan:     Remains inpatient appropriate:  SNF placement.   Procedures:  None.  Consultants:   None.   Antimicrobials:   Anti-infectives (From admission, onward)    Start     Dose/Rate Route Frequency Ordered Stop   07/27/22 2300  vancomycin (VANCOREADY) IVPB 1500 mg/300 mL  Status:  Discontinued        1,500 mg 150 mL/hr over 120 Minutes Intravenous Every 12 hours 07/27/22 1941 07/28/22 0859   07/27/22 1945  ceFEPIme (MAXIPIME) 2 g in sodium chloride 0.9 % 100 mL IVPB  Status:  Discontinued        2 g 200 mL/hr over 30 Minutes Intravenous Every 8 hours 07/27/22 1941 07/28/22 0858   07/27/22 1530  vancomycin (VANCOCIN) IVPB 1000 mg/200 mL premix        1,000 mg 200 mL/hr over 60 Minutes Intravenous  Once 07/27/22 1525 07/27/22 1736        Medications  Scheduled Meds:  feeding supplement  237 mL Oral BID BM   folic acid  1 mg Oral Daily   multivitamin with minerals  1 tablet Oral Daily   nutrition supplement (JUVEN)  1 packet Oral BID WC   thiamine  100 mg Oral Daily   Continuous Infusions:  sodium chloride 100 mL/hr at 08/06/22 0014   PRN Meds:.acetaminophen, mouth rinse    Subjective:   Dylan Weaver was seen and examined today. No complaints. Pleasant.    Objective:   Vitals:   08/05/22 1340 08/05/22 2144 08/06/22 0515 08/06/22 1347  BP: 95/60 96/65 95/65  94/69  Pulse: 75 96 71 72  Resp: 17 15 18 18   Temp: 98.5 F (36.9 C) 98.7 F (37.1 C) 98.1 F (36.7 C) (!) 97.5 F (36.4 C)  TempSrc: Oral Oral Oral Oral  SpO2:  97% 99% 99%  Weight:      Height:        Intake/Output Summary (Last 24 hours) at 08/06/2022 1841 Last data filed at 08/06/2022 1751 Gross per 24 hour  Intake 2832.72 ml  Output 1825 ml  Net 1007.72 ml    Filed Weights   07/27/22 1430  Weight: 81.6 kg     Exam General exam: Appears calm and comfortable  Respiratory system: Clear  to auscultation. Respiratory effort normal. Cardiovascular system: S1 & S2 heard, RRR. No JVD, No pedal edema. Gastrointestinal system: Abdomen is nondistended, soft and non tender Normal bowel sounds heard. Central nervous system: Alert and oriented. No focal neurological deficits. Extremities: Symmetric 5 x 5 power. Skin: No rashes, lesions or ulcers Psychiatry: Judgement and insight appear normal. Mood & affect appropriate.  Data Reviewed:  I have personally reviewed following labs and imaging studies   CBC Lab Results  Component Value Date   WBC 9.0 08/06/2022   RBC 3.40 (L) 08/06/2022   HGB 10.6 (L) 08/06/2022   HCT 33.0 (L) 08/06/2022   MCV 97.1 08/06/2022   MCH 31.2 08/06/2022   PLT 254 08/06/2022   MCHC 32.1 08/06/2022   RDW 16.2 (H) 08/06/2022   LYMPHSABS 2.1 08/06/2022   MONOABS 0.7 08/06/2022   EOSABS 0.3 08/06/2022   BASOSABS 0.1 15/72/6203     Last metabolic panel Lab Results  Component Value Date   NA 131 (L) 08/06/2022   K 4.1 08/06/2022   CL 104 08/06/2022   CO2 21 (L) 08/06/2022   BUN 12 08/06/2022   CREATININE 0.61 08/06/2022   GLUCOSE 93 08/06/2022   GFRNONAA >60 08/06/2022   CALCIUM 8.3 (L) 08/06/2022   PHOS 2.6 07/30/2022   PROT 6.9 07/30/2022   ALBUMIN 2.5 (L) 07/30/2022   BILITOT 1.0 07/30/2022   ALKPHOS 167 (H) 07/30/2022   AST 64 (H) 07/30/2022   ALT 43 07/30/2022   ANIONGAP 6 08/06/2022    CBG (last 3)  No results for input(s): "GLUCAP" in the last 72 hours.    Coagulation Profile: No results for input(s): "INR", "PROTIME" in the last 168 hours.    Radiology Studies: No results found.     Hosie Poisson M.D. [facility] 08/06/2022, 6:41 PM  Available via Epic secure chat 7am-7pm After 7 pm, please refer to night coverage provider listed on amion.

## 2022-08-06 NOTE — Plan of Care (Signed)
?  Problem: Coping: ?Goal: Level of anxiety will decrease ?Outcome: Progressing ?  ?Problem: Safety: ?Goal: Ability to remain free from injury will improve ?Outcome: Progressing ?  ?

## 2022-08-06 NOTE — Plan of Care (Signed)
  Problem: Coping: Goal: Level of anxiety will decrease Outcome: Progressing   Problem: Pain Managment: Goal: General experience of comfort will improve Outcome: Progressing   Problem: Safety: Goal: Ability to remain free from injury will improve Outcome: Progressing   

## 2022-08-06 NOTE — Progress Notes (Signed)
Occupational Therapy Treatment Patient Details Name: Dylan Weaver MRN: [id number] DOB: 1965-10-28 Today's Date: 08/06/2022   History of present illness Dylan Weaver is a 56 y.o. male with medical history significant of  alcohol abuse, history of enterococcus bacteremia and ulcers in the past.  Presented with buttock pain. Patient with known history of alcohol abuse states that he drove here from of [place] patient is actively hallucinating admits to drinking at least 6-12 beers per day have not been having any alcohol for past 3 days.  Reports that he has sustained a fall on his buttocks. Was noted to be covered in feces and disheveled.  There is redness of his buttocks consistent with likely cellulitis.  Patient is hallucinating stating that he has slipped on ice  Admits to alcohol abuse   OT comments  Treatment focused on advancing functional mobility, balance and independence with ADLs. Today patient supervision for supine to sit and min assist for sit to stand and ambulation in hallway approx 100 feet. Patient mod-max assist for LB bathing task in which he could support himself in standing with walker but reliant on upper extremity support limiting his ability to wash himself. Patient making progress. Patient positioned in chair with chair alarm. Continue POC.    Recommendations for follow up therapy are one component of a multi-disciplinary discharge planning process, led by the attending physician.  Recommendations may be updated based on patient status, additional functional criteria and insurance authorization.    Follow Up Recommendations  Skilled nursing-short term rehab (<3 hours/day)    Assistance Recommended at Discharge Frequent or constant Supervision/Assistance  Patient can return home with the following  A lot of help with walking and/or transfers;A lot of help with bathing/dressing/bathroom;Assistance with cooking/housework;Direct supervision/assist for  medications management;Direct supervision/assist for financial management;Assist for transportation;Help with stairs or ramp for entrance   Equipment Recommendations  None recommended by OT    Recommendations for Other Services      Precautions / Restrictions Precautions Precautions: Fall Restrictions Weight Bearing Restrictions: No       Mobility Bed Mobility Overal bed mobility: Needs Assistance Bed Mobility: Supine to Sit     Supine to sit: Supervision     General bed mobility comments: supervision to transfer to edge of bed.    Transfers Overall transfer level: Needs assistance Equipment used: Rolling walker (2 wheels) Transfers: Sit to/from Stand Sit to Stand: Min assist           General transfer comment: Min assist to stand and stabilize with walker. Patient ambulated with min assist in the hallway ~100 feet with walker.     Balance Overall balance assessment: Needs assistance Sitting-balance support: No upper extremity supported, Feet supported Sitting balance-Leahy Scale: Fair     Standing balance support: Bilateral upper extremity supported, Reliant on assistive device for balance, During functional activity Standing balance-Leahy Scale: Poor                             ADL either performed or assessed with clinical judgement   ADL       Grooming: Sitting;Set up;Brushing hair Grooming Details (indicate cue type and reason): brushed hair in recliner Upper Body Bathing: Minimal assistance;Sitting Upper Body Bathing Details (indicate cue type and reason): washed front of chest and underarms. Therapist assisted with back Lower Body Bathing: Maximal assistance;Sit to/from stand;Cueing for compensatory techniques Lower Body Bathing Details (indicate cue type and reason): Patient can wash  front half of thighs, periarea,. therapist assisted with buttocks and back of thighs Upper Body Dressing : Set up;Sitting                    Functional mobility during ADLs: Minimal assistance;Rolling walker (2 wheels) General ADL Comments: Min assist for sit to stand and min guard to stand in front of recliner/bed    Extremity/Trunk Assessment Upper Extremity Assessment Upper Extremity Assessment: Generalized weakness   Lower Extremity Assessment Lower Extremity Assessment: Generalized weakness   Cervical / Trunk Assessment Cervical / Trunk Assessment: Kyphotic    Vision Patient Visual Report: No change from baseline     Perception     Praxis      Cognition Arousal/Alertness: Awake/alert Behavior During Therapy: WFL for tasks assessed/performed Overall Cognitive Status: Impaired/Different from baseline                                 General Comments: Patient requesting motion lights in room. Educated patient that hospital didn't have this capability and he found that questionable. Overall pleasant and agreeable to work with therapy however        Exercises      Shoulder Instructions       General Comments      Pertinent Vitals/ Pain       Pain Assessment Pain Assessment: No/denies pain  Home Living                                          Prior Functioning/Environment              Frequency  Min 2X/week        Progress Toward Goals  OT Goals(current goals can now be found in the care plan section)  Progress towards OT goals: Progressing toward goals  Acute Rehab OT Goals OT Goal Formulation: With patient Time For Goal Achievement: 08/11/22 Potential to Achieve Goals: Good  Plan Discharge plan remains appropriate    Co-evaluation                 AM-PAC OT "6 Clicks" Daily Activity     Outcome Measure   Help from another person eating meals?: None Help from another person taking care of personal grooming?: A Little   Help from another person bathing (including washing, rinsing, drying)?: A Lot Help from another person to put on and  taking off regular upper body clothing?: A Little Help from another person to put on and taking off regular lower body clothing?: A Lot 6 Click Score: 14    End of Session Equipment Utilized During Treatment: Gait belt;Rolling walker (2 wheels)  OT Visit Diagnosis: Unsteadiness on feet (R26.81);Other abnormalities of gait and mobility (R26.89)   Activity Tolerance Patient tolerated treatment well   Patient Left in chair;with call bell/phone within reach;with chair alarm set   Nurse Communication Mobility status        Time: 4332-9518 OT Time Calculation (min): 21 min  Charges: OT General Charges $OT Visit: 1 Visit OT Treatments $Self Care/Home Management : 8-22 mins  Gustavo Lah, OTR/L [place]  Office [phone]   Lenward Chancellor 08/06/2022, 3:32 PM

## 2022-08-06 NOTE — Plan of Care (Signed)
  Problem: Clinical Measurements: Goal: Diagnostic test results will improve Outcome: Progressing   Problem: Activity: Goal: Risk for activity intolerance will decrease Outcome: Progressing   Problem: Pain Managment: Goal: General experience of comfort will improve Outcome: Progressing   

## 2022-08-07 MED ORDER — ADULT MULTIVITAMIN W/MINERALS CH: 1.0000 | ORAL_TABLET | Freq: Every day | ORAL | Status: DC

## 2022-08-07 MED ORDER — VITAMIN B-1 100 MG PO TABS: 100.0000 mg | ORAL_TABLET | Freq: Every day | ORAL | Status: DC

## 2022-08-07 MED ORDER — ENSURE ENLIVE PO LIQD
237.0000 mL | Freq: Two times a day (BID) | ORAL | 12 refills | Status: DC
  Filled 2022-08-07: qty 237, 1d supply, fill #0

## 2022-08-07 MED ORDER — JUVEN PO PACK: 1.0000 | PACK | Freq: Two times a day (BID) | ORAL | 0 refills | Status: DC

## 2022-08-07 NOTE — Progress Notes (Signed)
Patient sleeping well, having increased confusion when awake, disoriented to place and time, does not remember coming to the hospital, asking where his mother is, and keeps saying that he stopped drinking a week ago and shouldn't need to be in the hospital, attempts made to reorient patient which ended with patient just going back to sleep, able to reposition himself frequently in the bed, primofit working well, vss, will continue to monitor.

## 2022-08-07 NOTE — Plan of Care (Signed)
  Problem: Activity: Goal: Risk for activity intolerance will decrease Outcome: Progressing   Problem: Pain Managment: Goal: General experience of comfort will improve Outcome: Progressing   Problem: Safety: Goal: Ability to remain free from injury will improve Outcome: Progressing   

## 2022-08-07 NOTE — Plan of Care (Signed)
  Problem: Nutrition: Goal: Adequate nutrition will be maintained Outcome: Progressing   Problem: Coping: Goal: Level of anxiety will decrease Outcome: Progressing   Problem: Pain Managment: Goal: General experience of comfort will improve Outcome: Progressing   Problem: Safety: Goal: Ability to remain free from injury will improve Outcome: Progressing   

## 2022-08-07 NOTE — Discharge Summary (Signed)
Physician Discharge Summary   Patient: Stephano Cay MRN: [id number] DOB: May 18, 1966  Admit date:     07/27/2022  Discharge date: 08/08/22  Discharge Physician: Hosie Poisson   PCP: [facility]   Recommendations at discharge:  Please follow up with PCP in one week.  Discharge Diagnoses: Active Problems:   Alcohol use disorder with alcohol withdrawal    QT prolongation   Hyponatremia   Hypokalemia   Cellulitis   Pressure injury of skin    Hospital Course: 56 year old with a history of alcohol abuse who presented to the ED with complaint of pain in his buttocks but was found to be acutely confused and an apparent acute alcohol withdrawal after having abstained for 3 days.  Upon arrival he was covered in feces and quite disheveled.  CT pelvis in the ER revealed no evidence of abscess or free air within the soft tissues of the buttocks.  CT head was unrevealing.  Of note the patient had a nearly identical hospitalization in August of this year.  At that time attempts were made to place him within SNF but there were no bed offers. Pt seen and examined. No new complaints.  Waiting for SNF.   Assessment and Plan:   Acute alcohol withdrawal with delirium Alcohol associated dementia Delirium and agitation have improved Completed CIWA course. No signs of agitation today. No signs of withdrawal. He is pleasant and comfortable.      Mild transaminitis No nausea or vomiting.          Irritant contact dermatitis of the perineum  Cellulitis has been ruled out Appears to be primarily due to poor hygiene, Antibiotics were discontinued as there was no clear signs of infection. Overall improvement in the erythema and no evidence of frank infection on follow-up exams.     Hyponatremia Stabilized in the absence of alcohol use. Sodium improved to 131. REPEAT LABS show stable sodium at 131. Looks like this is his baseline.      Hypokalemia and  hypomagnesemia Replaced and corrected.       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 Hemoglobin stable between 9 to 10.        Left arm edema Venous duplex ruled out DVT probably secondary to infiltration of IV. Appears to have improved.       Moderate malnutrition secondary to social and environmental circumstances and alcohol abuse. Supplementation on board.     Minimal urine output:  Start the patient on IV fluids  Urine output improved today.      RN Pressure Injury Documentation:     Pressure Injury 04/26/22 Back Mid Stage 2 -  Partial thickness loss of dermis presenting as a shallow open injury with a red, pink wound bed without slough. (Active)  04/26/22 1517  Location: Back  Location Orientation: Mid  Staging: Stage 2 -  Partial thickness loss of dermis presenting as a shallow open injury with a red, pink wound bed without slough.  Wound Description (Comments):   Present on Admission: Yes     Pressure Injury 08/03/22 Vertebral column Medial Stage 2 -  Partial thickness loss of dermis presenting as a shallow open injury with a red, pink wound bed without slough. (Active)  08/03/22 2221  Location: Vertebral column  Location Orientation: Medial  Staging: Stage 2 -  Partial thickness loss of dermis presenting as a shallow open injury with a red, pink wound bed without slough.  Wound Description (Comments):   Present on Admission:   Dressing Type Foam - Lift dressing to assess site every shift 08/06/22 0825   Foam dressing.      Malnutrition Type:   Nutrition Problem: Moderate Malnutrition Etiology: social / environmental circumstances (alcohol abuse)     Malnutrition Characteristics:   Signs/Symptoms: mild fat depletion, moderate muscle depletion     Nutrition Interventions:   Interventions: Ensure Enlive (each supplement provides 350kcal and 20 grams of protein), MVI,  Juven   Estimated body mass index is 24.41 kg/m as calculated from the following:   Height as of this encounter: 6' (1.829 m).   Weight as of this encounter: 81.6 kg.      Consultants: none.  Procedures performed: none.   Disposition: Home Diet recommendation:  Discharge Diet Orders (From admission, onward)     Start     Ordered   08/07/22 0000  Diet - low sodium heart healthy        08/07/22 1535           Regular diet DISCHARGE MEDICATION: Allergies as of 08/07/2022       Reactions   Other Shortness Of Breath, Swelling, Other (See Comments)   Pine nuts- I became swollen all over when I was young from these"   Midodrine Other (See Comments)   Patient stated it makes him fell unwell so he does not take it        Medication List     STOP taking these medications    ferrous sulfate 325 (65 FE) MG tablet   thiamine 100 MG tablet Commonly known as: VITAMIN B1   Zinc Oxide 12.8 % ointment Commonly known as: TRIPLE PASTE       TAKE these medications    ascorbic acid 500 MG tablet Commonly known as: VITAMIN C Take 1 tablet (500 mg total) by mouth 2 (two) times daily.   nutrition supplement (JUVEN) Pack Take 1 packet by mouth 2 (two) times daily with a meal.   feeding supplement Liqd Take 237 mLs by mouth 2 (two) times daily between meals. Start taking on: November 7, [id number]   folic acid 1 MG tablet Commonly known as: FOLVITE Take 1 tablet (1 mg total) by mouth daily.   multivitamin with minerals Tabs tablet Take 1 tablet by mouth daily. Start taking on: August 08, 2022   thiamine 100 MG tablet Commonly known as: Vitamin B-1 Take 1 tablet (100 mg total) by mouth daily. Start taking on: August 08, 2022               Discharge Care Instructions  (From admission, onward)           Start     Ordered   08/07/22 0000  Discharge wound care:       Comments: Wound care to buttocks, inguinal and perineal areas of irritant contact  dermatitis due to urinary and fecal incontinence:  Cleanse with house skin cleanser, pat dry. Apply moisture barrier cream (Critic Aid Clean, purple topped tube) top affected areas. Apply 4 times daily and PRN soiling.  07/28/22 0601   08/07/22 [place] Follow up.   Why: [place] informationLoistine Chance [place] [address] [phone]                Discharge Exam: Danley Danker Weights  07/27/22 1430  Weight: 81.6 kg   General exam: Appears calm and comfortable  Respiratory system: Clear to auscultation. Respiratory effort normal. Cardiovascular system: S1 & S2 heard, RRR. No JVD, murmurs, rubs, gallops or clicks. No pedal edema. Gastrointestinal system: Abdomen is nondistended, soft and nontender. No organomegaly or masses felt. Normal bowel sounds heard. Central nervous system: Alert and oriented. No focal neurological deficits. Extremities: Symmetric 5 x 5 power. Skin: No rashes, lesions or ulcers Psychiatry: Judgement and insight appear normal. Mood & affect appropriate.    Condition at discharge: fair  The results of significant diagnostics from this hospitalization (including imaging, microbiology, ancillary and laboratory) are listed below for reference.   Imaging Studies: VAS [place] UPPER EXTREMITY VENOUS DUPLEX  Result Date: 07/30/2022 UPPER VENOUS STUDY  Patient Name:  Obert TISHON LOOMIS  Date of Exam:   07/30/2022 Medical Rec #: [id number]                Accession #:    [id number] Date of Birth: 1966-07-09                Patient Gender: M Patient Age:   61 years Exam Location:  [facility] Procedure:      VAS [place] UPPER EXTREMITY VENOUS DUPLEX Referring Phys: JEFFREY MCCLUNG --------------------------------------------------------------------------------  Indications: Swelling Risk Factors: None identified. Comparison Study: No  prior studies. Performing Technologist: Oliver Hum RVT  Examination Guidelines: A complete evaluation includes B-mode imaging, spectral Doppler, color Doppler, and power Doppler as needed of all accessible portions of each vessel. Bilateral testing is considered an integral part of a complete examination. Limited examinations for reoccurring indications may be performed as noted.  Right Findings: +----------+------------+---------+-----------+----------+-------+ RIGHT     CompressiblePhasicitySpontaneousPropertiesSummary +----------+------------+---------+-----------+----------+-------+ Subclavian    Full       Yes       Yes                      +----------+------------+---------+-----------+----------+-------+  Left Findings: +----------+------------+---------+-----------+----------+-------+ LEFT      CompressiblePhasicitySpontaneousPropertiesSummary +----------+------------+---------+-----------+----------+-------+ IJV           Full       Yes       Yes                      +----------+------------+---------+-----------+----------+-------+ Subclavian    Full       Yes       Yes                      +----------+------------+---------+-----------+----------+-------+ Axillary      Full       Yes       Yes                      +----------+------------+---------+-----------+----------+-------+ Brachial      Full       Yes       Yes                      +----------+------------+---------+-----------+----------+-------+ Radial        Full                                          +----------+------------+---------+-----------+----------+-------+ Ulnar         Full                                          +----------+------------+---------+-----------+----------+-------+  Cephalic      Full                                          +----------+------------+---------+-----------+----------+-------+ Basilic       Full                                           +----------+------------+---------+-----------+----------+-------+  Summary:  Right: No evidence of thrombosis in the subclavian.  Left: No evidence of deep vein thrombosis in the upper extremity. No evidence of superficial vein thrombosis in the upper extremity.  *See table(s) above for measurements and observations.  Diagnosing physician: Heath Lark Electronically signed by Heath Lark on 07/30/2022 at 2:47:30 PM.    Final    ECHOCARDIOGRAM COMPLETE  Result Date: 07/28/2022    ECHOCARDIOGRAM REPORT   Patient Name:   [place] CHRISTOPHER Marques Date of Exam: 07/28/2022 Medical Rec #:  [id number]               Height:       72.0 in Accession #:    [id number]              Weight:       180.0 lb Date of Birth:  May 20, 1966               BSA:          2.037 m Patient Age:    56 years                BP:           92/59 mmHg Patient Gender: M                       HR:           87 bpm. Exam Location:  Inpatient Procedure: 2D Echo, Cardiac Doppler and Color Doppler Indications:    Abnormal ECG  History:        Patient has prior history of Echocardiogram examinations, most                 recent 09/02/2021. Risk Factors:Current Smoker. ETOH.  Sonographer:    Mikki Harbor Referring Phys: [id number] ANASTASSIA DOUTOVA IMPRESSIONS  1. Left ventricular ejection fraction, by estimation, is 65 to 70%. The left ventricle has normal function. The left ventricle has no regional wall motion abnormalities. Left ventricular diastolic parameters are consistent with Grade I diastolic dysfunction (impaired relaxation).  2. Right ventricular systolic function is normal. The right ventricular size is normal.  3. The mitral valve is normal in structure. No evidence of mitral valve regurgitation. No evidence of mitral stenosis.  4. The aortic valve is normal in structure. Aortic valve regurgitation is not visualized. No aortic stenosis is present.  5. The inferior vena cava is normal in size with greater than 50% respiratory  variability, suggesting right atrial pressure of 3 mmHg. Comparison(s): No significant change from prior study. Prior images reviewed side by side. FINDINGS  Left Ventricle: Left ventricular ejection fraction, by estimation, is 65 to 70%. The left ventricle has normal function. The left ventricle has no regional wall motion abnormalities. The left ventricular internal cavity size was normal in size. There is  no left ventricular hypertrophy. Left ventricular diastolic parameters are consistent  with Grade I diastolic dysfunction (impaired relaxation). Right Ventricle: The right ventricular size is normal. No increase in right ventricular wall thickness. Right ventricular systolic function is normal. Left Atrium: Left atrial size was normal in size. Right Atrium: Right atrial size was normal in size. Pericardium: There is no evidence of pericardial effusion. Mitral Valve: The mitral valve is normal in structure. No evidence of mitral valve regurgitation. No evidence of mitral valve stenosis. MV peak gradient, 2.5 mmHg. The mean mitral valve gradient is 1.0 mmHg. Tricuspid Valve: The tricuspid valve is normal in structure. Tricuspid valve regurgitation is not demonstrated. No evidence of tricuspid stenosis. Aortic Valve: The aortic valve is normal in structure. Aortic valve regurgitation is not visualized. No aortic stenosis is present. Aortic valve mean gradient measures 3.0 mmHg. Aortic valve peak gradient measures 5.7 mmHg. Aortic valve area, by VTI measures 2.99 cm. Pulmonic Valve: The pulmonic valve was normal in structure. Pulmonic valve regurgitation is not visualized. No evidence of pulmonic stenosis. Aorta: The aortic root is normal in size and structure. Venous: The inferior vena cava is normal in size with greater than 50% respiratory variability, suggesting right atrial pressure of 3 mmHg. IAS/Shunts: No atrial level shunt detected by color flow Doppler.  LEFT VENTRICLE PLAX 2D LVIDd:         4.10 cm    Diastology LVIDs:         2.30 cm   LV e' medial:   8.81 cm/s LV PW:         0.80 cm   LV E/e' medial: 7.0 LV IVS:        1.30 cm LVOT diam:     2.10 cm LV SV:         73 LV SV Index:   36 LVOT Area:     3.46 cm  RIGHT VENTRICLE RV Basal diam:  3.30 cm RV Mid diam:    3.00 cm RV S prime:     12.20 cm/s TAPSE (M-mode): 2.2 cm LEFT ATRIUM             Index        RIGHT ATRIUM           Index LA diam:        3.70 cm 1.82 cm/m   RA Area:     19.20 cm LA Vol (A2C):   58.5 ml 28.72 ml/m  RA Volume:   58.60 ml  28.76 ml/m LA Vol (A4C):   57.4 ml 28.18 ml/m LA Biplane Vol: 64.2 ml 31.51 ml/m  AORTIC VALVE                    PULMONIC VALVE AV Area (Vmax):    2.56 cm     PV Vmax:       0.82 m/s AV Area (Vmean):   2.68 cm     PV Peak grad:  2.7 mmHg AV Area (VTI):     2.99 cm AV Vmax:           119.00 cm/s AV Vmean:          81.300 cm/s AV VTI:            0.243 m AV Peak Grad:      5.7 mmHg AV Mean Grad:      3.0 mmHg LVOT Vmax:         88.10 cm/s LVOT Vmean:        62.800 cm/s LVOT VTI:  0.210 m LVOT/AV VTI ratio: 0.86  AORTA Ao Root diam: 3.60 cm MITRAL VALVE MV Area (PHT): 3.65 cm    SHUNTS MV Area VTI:   3.73 cm    Systemic VTI:  0.21 m MV Peak grad:  2.5 mmHg    Systemic Diam: 2.10 cm MV Mean grad:  1.0 mmHg MV Vmax:       0.79 m/s MV Vmean:      47.9 cm/s MV Decel Time: 208 msec MV E velocity: 61.90 cm/s MV A velocity: 51.40 cm/s MV E/A ratio:  1.20 Candee Furbish MD Electronically signed by Candee Furbish MD Signature Date/Time: 07/28/2022/3:10:29 PM    Final    CT ABDOMEN PELVIS W CONTRAST  Result Date: 07/27/2022 CLINICAL DATA:  Sepsis cellulitis to buttock and perineum.r/o gas collection EXAM: CT ABDOMEN AND PELVIS WITH CONTRAST TECHNIQUE: Multidetector CT imaging of the abdomen and pelvis was performed using the standard protocol following bolus administration of intravenous contrast. RADIATION DOSE REDUCTION: This exam was performed according to the departmental dose-optimization program which  includes automated exposure control, adjustment of the mA and/or kV according to patient size and/or use of iterative reconstruction technique. CONTRAST:  168mL OMNIPAQUE IOHEXOL 300 MG/ML  SOLN COMPARISON:  None Available. FINDINGS: Lower chest: Indeterminate subpleural ground-glass airspace opacity within the right middle lobe (4:36.). Trace hiatal hernia. Hepatobiliary: The hepatic parenchyma is diffusely hypodense compared to the splenic parenchyma consistent with fatty infiltration. No focal liver abnormality. Calcified stones within the gallbladder lumen. No gallbladder wall thickening or pericholecystic fluid. No biliary dilatation. Pancreas: No focal lesion. Normal pancreatic contour. No surrounding inflammatory changes. No main pancreatic ductal dilatation. Spleen: Normal in size without focal abnormality. Adrenals/Urinary Tract: No adrenal nodule bilaterally. Bilateral kidneys enhance symmetrically. No hydronephrosis. No hydroureter. The urinary bladder is unremarkable. Stomach/Bowel: Stomach is within normal limits. No evidence of bowel wall thickening or dilatation. Colonic diverticulosis. Fatty infiltration of the wall of the appendix and ascending colon likely due to chronic inflammatory changes. The appendix is enlarged in caliber measuring up to 1 cm-nonspecific. No associated periappendiceal fat stranding or fluid. No appendicoliths. Gas is noted within the lumen of the appendix. Vascular/Lymphatic: No abdominal aorta or iliac aneurysm. Mild atherosclerotic plaque of the aorta and its branches. No abdominal, pelvic, or inguinal lymphadenopathy. Reproductive: Prostate is unremarkable. Other: No intraperitoneal free fluid. No intraperitoneal free gas. No organized fluid collection. Musculoskeletal: Bilateral gynecomastia. No subcutaneus soft tissue edema, emphysema, or organized fluid collection of the perineum. Scrotum only partially visualized. Tiny fat containing umbilical hernia. No suspicious  lytic or blastic osseous lesions. No acute displaced fracture. Multilevel degenerative changes of the spine. Old healed left rib fractures. IMPRESSION: 1. No subcutaneus soft tissue edema, emphysema, or organized fluid collection of the perineum. Scrotum only partially visualized. 2. Indeterminate subpleural ground-glass airspace opacity within the right middle lobe. 3. Trace hiatal hernia. 4. Hepatic steatosis. 5. Cholelithiasis with no CT evidence of acute cholecystitis. 6. Colonic diverticulosis with no acute diverticulitis. 7.  Aortic Atherosclerosis (ICD10-I70.0). Electronically Signed   By: Iven Finn M.D.   On: 07/27/2022 18:09   CT Head Wo Contrast  Result Date: 07/27/2022 CLINICAL DATA:  Altered mental status. EXAM: CT HEAD WITHOUT CONTRAST TECHNIQUE: Contiguous axial images were obtained from the base of the skull through the vertex without intravenous contrast. RADIATION DOSE REDUCTION: This exam was performed according to the departmental dose-optimization program which includes automated exposure control, adjustment of the mA and/or kV according to patient size and/or use of iterative  reconstruction technique. COMPARISON:  Head CT dated 04/25/2022. FINDINGS: Brain: Mild age-related atrophy and chronic microvascular ischemic changes. There is no acute intracranial hemorrhage. No mass effect or midline shift. No extra-axial fluid collection. Vascular: No hyperdense vessel or unexpected calcification. Skull: Normal. Negative for fracture or focal lesion. Sinuses/Orbits: Partially visualized left maxillary sinus retention cyst or polyp. The remainder of the visualized paranasal sinuses and mastoid air cells are clear. Other: None IMPRESSION: 1. No acute intracranial pathology. 2. Mild age-related atrophy and chronic microvascular ischemic changes. Electronically Signed   By: Anner Crete M.D.   On: 07/27/2022 18:03   DG Chest 2 View  Result Date: 07/27/2022 CLINICAL DATA:  Altered level  of consciousness EXAM: CHEST - 2 VIEW COMPARISON:  04/25/2022 FINDINGS: Frontal and lateral views of the chest demonstrate an unremarkable cardiac silhouette. No acute airspace disease, effusion, or pneumothorax. Chronic L1 compression deformity. No acute bony abnormality. IMPRESSION: 1. No acute intrathoracic process. Electronically Signed   By: Randa Ngo M.D.   On: 07/27/2022 15:51    Microbiology: Results for orders placed or performed during the hospital encounter of 07/27/22  Blood culture (routine x 2)     Status: None   Collection Time: 07/27/22  3:50 PM   Specimen: BLOOD  Result Value Ref Range Status   Specimen Description   Final    BLOOD BLOOD RIGHT WRIST Performed at [facility] [address], [place], [place] [address]    Special Requests   Final    BOTTLES DRAWN AEROBIC AND ANAEROBIC Blood Culture results may not be optimal due to an inadequate volume of blood received in culture bottles Performed at [facility] [address], [place], [place] [address]    Culture   Final    NO GROWTH 5 DAYS Performed at [facility], [place] [address], [place], [place] [address]    Report Status 08/01/2022 FINAL  Final  Blood culture (routine x 2)     Status: None   Collection Time: 07/27/22  5:30 PM   Specimen: BLOOD  Result Value Ref Range Status   Specimen Description   Final    BLOOD LEFT ANTECUBITAL Performed at [facility] [address], [place], [place] [address]    Special Requests   Final    BOTTLES DRAWN AEROBIC AND ANAEROBIC Blood Culture adequate volume Performed at [facility] [address] [place], [place] [address]    Culture   Final    NO GROWTH 5 DAYS Performed at [facility], [place] [address], [place], [place] [address]    Report Status 08/01/2022 FINAL  Final  MRSA Next Gen by PCR, Nasal     Status: None   Collection Time: 07/27/22  7:40 PM   Specimen: Nasal  Mucosa; Nasal Swab  Result Value Ref Range Status   MRSA by PCR Next Gen NOT DETECTED NOT DETECTED Final    Comment: (NOTE) The GeneXpert MRSA Assay (FDA approved for NASAL specimens only), is one component of a comprehensive MRSA colonization surveillance program. It is not intended to diagnose MRSA infection nor to guide or monitor treatment for MRSA infections. Test performance is not FDA approved in patients less than 21 years old. Performed at [facility], [place] [address] [place], [place] [address]   Urine Culture     Status: Abnormal   Collection Time: 07/28/22  6:42 AM   Specimen: Urine, Catheterized  Result Value Ref  Range Status   Specimen Description   Final    URINE, CATHETERIZED Performed at [facility] [address], [place], [place] [address]    Special Requests   Final    NONE Performed at [facility], [place] [address], [place], [place] [address]    Culture (A)  Final    <10,000 COLONIES/mL INSIGNIFICANT GROWTH Performed at [facility] [address] [place], [place] [address]    Report Status 07/29/2022 FINAL  Final    Labs: CBC: Recent Labs  Lab 08/06/22 0325  WBC 9.0  NEUTROABS 5.9  HGB 10.6*  HCT 33.0*  MCV 97.1  PLT 384   Basic Metabolic Panel: Recent Labs  Lab 08/01/22 0517 08/06/22 0325  NA 131* 131*  K 4.6 4.1  CL 102 104  CO2 21* 21*  GLUCOSE 82 93  BUN 10 12  CREATININE 0.57* 0.61  CALCIUM 9.2 8.3*  MG 1.8  --    Liver Function Tests: No results for input(s): "AST", "ALT", "ALKPHOS", "BILITOT", "PROT", "ALBUMIN" in the last 168 hours. CBG: No results for input(s): "GLUCAP" in the last 168 hours.  Discharge time spent: 35 minutes.   Signed: Hosie Poisson, MD [facility] 08/07/2022

## 2022-08-07 NOTE — Progress Notes (Signed)
Physical Therapy Treatment Patient Details Name: Dylan Weaver MRN: [id number] DOB: [birth year] Today's Date: 08/07/2022   History of Present Illness Dylan Weaver is a 56 y.o. male with medical history significant of  alcohol abuse, history of enterococcus bacteremia and ulcers in the past.  Presented with buttock pain. Patient with known history of alcohol abuse states that he drove here from of [place] patient is actively hallucinating admits to drinking at least 6-12 beers per day have not been having any alcohol for past 3 days.  Reports that he has sustained a fall on his buttocks. Was noted to be covered in feces and disheveled.  There is redness of his buttocks consistent with likely cellulitis.  Patient is hallucinating stating that he has slipped on ice  Admits to alcohol abuse    PT Comments    Patient in pleasant mood and agreeable to therapy, "let's go walk the block." Inconsistent, poor insight and poor memory. Required supervision for bed mobility for safety, min assist for transfers to maintain initial standing balance, and min assist for ambulation. Unsafe and increased unsteadiness with turns; Poor safety awareness, posterior lean, and unsteadiness while ambulating. Cues needed to slow down to control balance. Poor stand to sit (plops down). Physical therapy recommends SNF.  Recommendations for follow up therapy are one component of a multi-disciplinary discharge planning process, led by the attending physician.  Recommendations may be updated based on patient status, additional functional criteria and insurance authorization.  Follow Up Recommendations  Skilled nursing-short term rehab (<3 hours/day) Can patient physically be transported by private vehicle: No   Assistance Recommended at Discharge    Patient can return home with the following Help with stairs or ramp for entrance;Assist for transportation;Assistance with cooking/housework;A lot of help with  bathing/dressing/bathroom;Two people to help with walking and/or transfers   Equipment Recommendations  Rolling walker (2 wheels)    Recommendations for Other Services       Precautions / Restrictions Precautions Precautions: Fall Restrictions Weight Bearing Restrictions: No     Mobility  Bed Mobility Overal bed mobility: Needs Assistance Bed Mobility: Supine to Sit     Supine to sit: Supervision, HOB elevated     General bed mobility comments: supervision to transfer to edge of bed.    Transfers Overall transfer level: Needs assistance Equipment used: Rolling walker (2 wheels) Transfers: Sit to/from Stand Sit to Stand: Min assist           General transfer comment: Min assist to stand and stabilize with walker.    Ambulation/Gait Ambulation/Gait assistance: Min assist Gait Distance (Feet): 50 Feet Assistive device: Rolling walker (2 wheels) Gait Pattern/deviations: Step-through pattern, Decreased stride length, Trunk flexed, Narrow base of support, Scissoring       General Gait Details: Posterior lean. Undsteadiness while ambulating. Cues needed to slow down to control balance.   Stairs             Wheelchair Mobility    Modified Rankin (Stroke Patients Only)       Balance                                            Cognition Arousal/Alertness: Awake/alert Behavior During Therapy: WFL for tasks assessed/performed Overall Cognitive Status: Impaired/Different from baseline Area of Impairment: Orientation, Attention, Memory, Following commands, Safety/judgement, Awareness, Problem solving  Orientation Level: Disoriented to, Place, Situation, Time Current Attention Level: Sustained Memory: Decreased recall of precautions, Decreased short-term memory Following Commands: Follows one step commands consistently, Follows one step commands with increased time Safety/Judgement: Decreased awareness of safety,  Decreased awareness of deficits Awareness: Emergent Problem Solving: Slow processing, Decreased initiation, Difficulty sequencing, Requires verbal cues General Comments: In a pleasant mood, agreeable to work with therapy.        Exercises      General Comments        Pertinent Vitals/Pain Pain Assessment Pain Assessment: No/denies pain    Home Living                          Prior Function            PT Goals (current goals can now be found in the care plan section) Acute Rehab PT Goals Patient Stated Goal: none stated PT Goal Formulation: With patient Time For Goal Achievement: 08/12/22 Potential to Achieve Goals: Fair Progress towards PT goals: Progressing toward goals    Frequency    Min 2X/week      PT Plan Current plan remains appropriate    Co-evaluation              AM-PAC PT "6 Clicks" Mobility   Outcome Measure  Help needed turning from your back to your side while in a flat bed without using bedrails?: A Lot Help needed moving from lying on your back to sitting on the side of a flat bed without using bedrails?: A Lot Help needed moving to and from a bed to a chair (including a wheelchair)?: A Lot Help needed standing up from a chair using your arms (e.g., wheelchair or bedside chair)?: A Little Help needed to walk in hospital room?: A Little Help needed climbing 3-5 steps with a railing? : Total 6 Click Score: 13    End of Session Equipment Utilized During Treatment: Gait belt Activity Tolerance: Patient tolerated treatment well Patient left: in chair;with call bell/phone within reach;with chair alarm set Nurse Communication: Mobility status PT Visit Diagnosis: Difficulty in walking, not elsewhere classified (R26.2);History of falling (Z91.81)     Time: 1207-1219 PT Time Calculation (min) (ACUTE ONLY): 12 min  Charges:  $Gait Training: 8-22 mins                        Alesia Morin 08/07/2022, 12:33 PM

## 2022-08-07 NOTE — TOC Progression Note (Addendum)
Transition of Care [facility]) - Progression Note    Patient Details  Name: Sinjin Amero MRN: [id number] Date of Birth: 11-Nov-1965  Transition of Care [facility]) CM/SW Contact  Lennart Pall, LCSW Phone Number: 08/07/2022, 11:41 AM  Clinical Narrative:     Have called and re-faxed to all area [place] approved SNFs and continue to await bed offer.  ADDENDUM:   Have received SN[facility] bed offer [facility] and pt has accepted.  Hoping to dc tomorrow but await confirmation from [place] that they will be able to arrange for transport.  Expected Discharge Plan: [place] Barriers to Discharge: Continued Medical Work up  Expected Discharge Plan and Services Expected Discharge Plan: [place]   Discharge Planning Services: CM Consult Post Acute Care Choice: [place] arrangements for the past 2 months: [place]: [place] ([place]) Date [place]: 07/28/22 Time [place]: [id number] Representative spoke with at [place]:  Caryl Pina)   Social Determinants of Health ([place]) Interventions    Readmission Risk Interventions    07/28/2022    2:36 PM  Readmission Risk Prevention Plan  Transportation Screening Complete  PCP or Specialist Appt within 3-5 Days Complete  HRI or [place] Complete  Social Work Consult for [place] Planning/Counseling Complete  Palliative Care Screening Not Applicable  Medication Review Press photographer) Complete

## 2022-08-08 ENCOUNTER — Other Ambulatory Visit (HOSPITAL_COMMUNITY): Payer: Self-pay

## 2022-08-08 NOTE — Progress Notes (Signed)
Physical Therapy Treatment Patient Details Name: Dylan Weaver MRN: [id number] DOB: 06-20-1966 Today's Date: 08/08/2022   History of Present Illness Dylan Weaver is a 56 y.o. male with medical history significant of  alcohol abuse, history of enterococcus bacteremia and ulcers in the past.  Presented with buttock pain. Patient with known history of alcohol abuse states that he drove here from of [place] patient is actively hallucinating admits to drinking at least 6-12 beers per day have not been having any alcohol for past 3 days.  Reports that he has sustained a fall on his buttocks. Was noted to be covered in feces and disheveled.  There is redness of his buttocks consistent with likely cellulitis.  Patient is hallucinating stating that he has slipped on ice  Admits to alcohol abuse    PT Comments    Patient in pleasant mood and agreeable to physical therapy. Denies any pain. Patient required min assist for bed mobility to bring trunk upright, min assist needed for transfers to maintain initial balance, and min assist needed for ambulation. Ambulates with posterior lean and unsteadiness. Cues needed to maintain RW at proximal distance. Attempted to do BERG but unable due to poor balance/posterior lean. TUG average was 24.62 requiring min guard for safety. Pt will need ST Rehab at SNF to address mobility and functional decline prior to safely returning home.   Recommendations for follow up therapy are one component of a multi-disciplinary discharge planning process, led by the attending physician.  Recommendations may be updated based on patient status, additional functional criteria and insurance authorization.  Follow Up Recommendations  Skilled nursing-short term rehab (<3 hours/day) Can patient physically be transported by private vehicle: No   Assistance Recommended at Discharge Frequent or constant Supervision/Assistance  Patient can return home with the following Help  with stairs or ramp for entrance;Assist for transportation;Assistance with cooking/housework;A lot of help with bathing/dressing/bathroom;Two people to help with walking and/or transfers   Equipment Recommendations  Rolling walker (2 wheels)    Recommendations for Other Services       Precautions / Restrictions Precautions Precautions: Fall Restrictions Weight Bearing Restrictions: No     Mobility  Bed Mobility Overal bed mobility: Needs Assistance Bed Mobility: Supine to Sit     Supine to sit: HOB elevated, Min assist     General bed mobility comments: min assist needed to bring trunk upright.    Transfers Overall transfer level: Needs assistance Equipment used: Rolling walker (2 wheels) Transfers: Sit to/from Stand Sit to Stand: Min assist           General transfer comment: Min assist to stand and stabilize with walker.    Ambulation/Gait Ambulation/Gait assistance: Min assist Gait Distance (Feet): 100 Feet Assistive device: Rolling walker (2 wheels) Gait Pattern/deviations: Step-through pattern, Decreased stride length, Trunk flexed, Narrow base of support, Scissoring Gait velocity: decr     General Gait Details: Posterior lean. Undsteadiness while ambulating.   Stairs             Wheelchair Mobility    Modified Rankin (Stroke Patients Only)       Balance                                 Standardized Balance Assessment Standardized Balance Assessment : TUG: Timed Up and Go Test     Timed Up and Go Test TUG: Manual TUG Manual TUG (seconds): 24.62    Cognition  Arousal/Alertness: Awake/alert Behavior During Therapy: WFL for tasks assessed/performed Overall Cognitive Status: Impaired/Different from baseline Area of Impairment: Orientation, Attention, Memory, Following commands, Safety/judgement, Awareness, Problem solving                 Orientation Level: Disoriented to, Place, Situation, Time Current Attention  Level: Sustained Memory: Decreased recall of precautions, Decreased short-term memory Following Commands: Follows one step commands consistently, Follows one step commands with increased time Safety/Judgement: Decreased awareness of safety, Decreased awareness of deficits Awareness: Emergent Problem Solving: Slow processing, Decreased initiation, Difficulty sequencing, Requires verbal cues General Comments: In a pleasant mood, agreeable to work with therapy.        Exercises      General Comments General comments (skin integrity, edema, etc.): Attempted to do BERG but unable due to poor balance/severe posterior lean.      Pertinent Vitals/Pain Pain Assessment Pain Assessment: No/denies pain Pain Intervention(s): Monitored during session    Home Living                          Prior Function            PT Goals (current goals can now be found in the care plan section) Acute Rehab PT Goals Patient Stated Goal: none stated PT Goal Formulation: With patient Time For Goal Achievement: 08/12/22 Potential to Achieve Goals: Fair Progress towards PT goals: Progressing toward goals    Frequency    Min 2X/week      PT Plan Current plan remains appropriate    Co-evaluation              AM-PAC PT "6 Clicks" Mobility   Outcome Measure  Help needed turning from your back to your side while in a flat bed without using bedrails?: A Lot Help needed moving from lying on your back to sitting on the side of a flat bed without using bedrails?: A Lot Help needed moving to and from a bed to a chair (including a wheelchair)?: A Lot Help needed standing up from a chair using your arms (e.g., wheelchair or bedside chair)?: A Little Help needed to walk in hospital room?: A Little Help needed climbing 3-5 steps with a railing? : Total 6 Click Score: 13    End of Session Equipment Utilized During Treatment: Gait belt Activity Tolerance: Patient tolerated treatment  well Patient left: in chair;with call bell/phone within reach;with chair alarm set Nurse Communication: Mobility status PT Visit Diagnosis: Difficulty in walking, not elsewhere classified (R26.2);History of falling (Z91.81)     Time: 1540-1605 PT Time Calculation (min) (ACUTE ONLY): 25 min  Charges:  $Gait Training: 8-22 mins $Therapeutic Activity: 8-22 mins                        Alesia Morin 08/08/2022, 4:13 PM

## 2022-08-08 NOTE — TOC Progression Note (Signed)
Transition of Care [facility]) - Progression Note    Patient Details  Name: Dylan Weaver MRN: [id number] Date of Birth: 11-Dec-1965  [facility]) CM/SW Contact  Lennart Pall, LCSW Phone Number: 08/08/2022, 1:09 PM  Clinical Narrative:     [facility] Jola Baptist [phone]) in attempt to confirm assistance with SNF transport.  Pt does not have anyone who he can identify to assist with transport.  Await contact from [place].  Have updated [place] admissions coordinator of reason for delay.    Expected Discharge Plan: [place] Barriers to Discharge: Continued Medical Work up  Expected Discharge Plan and Services Expected Discharge Plan: [place]   Discharge Planning Services: CM Consult Post Acute Care Choice: [place] arrangements for the past 2 months: [place]: [place] ([place]) Date [place]: 07/28/22 Time [place]: [id number] Representative spoke with at [place]:  Caryl Pina)   Social Determinants of Health ([place]) Interventions    Readmission Risk Interventions    07/28/2022    2:36 PM  Readmission Risk Prevention Plan  Transportation Screening Complete  PCP or Specialist Appt within 3-5 Days Complete  HRI or [place] Complete  Social Work Consult for [place] Planning/Counseling Complete  Palliative Care Screening Not Applicable  Medication Review Press photographer) Complete

## 2022-08-08 NOTE — Discharge Summary (Signed)
Physician Discharge Summary   Patient: Stephano Cay MRN: [id number] DOB: May 18, 1966  Admit date:     07/27/2022  Discharge date: 08/08/22  Discharge Physician: Hosie Poisson   PCP: [facility]   Recommendations at discharge:  Please follow up with PCP in one week.  Discharge Diagnoses: Active Problems:   Alcohol use disorder with alcohol withdrawal    QT prolongation   Hyponatremia   Hypokalemia   Cellulitis   Pressure injury of skin    Hospital Course: 56 year old with a history of alcohol abuse who presented to the ED with complaint of pain in his buttocks but was found to be acutely confused and an apparent acute alcohol withdrawal after having abstained for 3 days.  Upon arrival he was covered in feces and quite disheveled.  CT pelvis in the ER revealed no evidence of abscess or free air within the soft tissues of the buttocks.  CT head was unrevealing.  Of note the patient had a nearly identical hospitalization in August of this year.  At that time attempts were made to place him within SNF but there were no bed offers. Pt seen and examined. No new complaints.  Waiting for SNF.   Assessment and Plan:   Acute alcohol withdrawal with delirium Alcohol associated dementia Delirium and agitation have improved Completed CIWA course. No signs of agitation today. No signs of withdrawal. He is pleasant and comfortable.      Mild transaminitis No nausea or vomiting.          Irritant contact dermatitis of the perineum  Cellulitis has been ruled out Appears to be primarily due to poor hygiene, Antibiotics were discontinued as there was no clear signs of infection. Overall improvement in the erythema and no evidence of frank infection on follow-up exams.     Hyponatremia Stabilized in the absence of alcohol use. Sodium improved to 131. REPEAT LABS show stable sodium at 131. Looks like this is his baseline.      Hypokalemia and  hypomagnesemia Replaced and corrected.       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 Hemoglobin stable between 9 to 10.        Left arm edema Venous duplex ruled out DVT probably secondary to infiltration of IV. Appears to have improved.       Moderate malnutrition secondary to social and environmental circumstances and alcohol abuse. Supplementation on board.     Minimal urine output:  Start the patient on IV fluids  Urine output improved today.      RN Pressure Injury Documentation:     Pressure Injury 04/26/22 Back Mid Stage 2 -  Partial thickness loss of dermis presenting as a shallow open injury with a red, pink wound bed without slough. (Active)  04/26/22 1517  Location: Back  Location Orientation: Mid  Staging: Stage 2 -  Partial thickness loss of dermis presenting as a shallow open injury with a red, pink wound bed without slough.  Wound Description (Comments):   Present on Admission: Yes     Pressure Injury 08/03/22 Vertebral column Medial Stage 2 -  Partial thickness loss of dermis presenting as a shallow open injury with a red, pink wound bed without slough. (Active)  08/03/22 2221  Location: Vertebral column  Location Orientation: Medial  Staging: Stage 2 -  Partial thickness loss of dermis presenting as a shallow open injury with a red, pink wound bed without slough.  Wound Description (Comments):   Present on Admission:   Dressing Type Foam - Lift dressing to assess site every shift 08/06/22 0825   Foam dressing.      Malnutrition Type:   Nutrition Problem: Moderate Malnutrition Etiology: social / environmental circumstances (alcohol abuse)     Malnutrition Characteristics:   Signs/Symptoms: mild fat depletion, moderate muscle depletion     Nutrition Interventions:   Interventions: Ensure Enlive (each supplement provides 350kcal and 20 grams of protein), MVI,  Juven   Estimated body mass index is 24.41 kg/m as calculated from the following:   Height as of this encounter: 6' (1.829 m).   Weight as of this encounter: 81.6 kg.      Consultants: none.  Procedures performed: none.   Disposition: Home Diet recommendation:  Discharge Diet Orders (From admission, onward)     Start     Ordered   08/07/22 0000  Diet - low sodium heart healthy        08/07/22 1535           Regular diet DISCHARGE MEDICATION: Allergies as of 08/08/2022       Reactions   Other Shortness Of Breath, Swelling, Other (See Comments)   Pine nuts- I became swollen all over when I was young from these"   Midodrine Other (See Comments)   Patient stated it makes him fell unwell so he does not take it        Medication List     STOP taking these medications    ferrous sulfate 325 (65 FE) MG tablet   thiamine 100 MG tablet Commonly known as: VITAMIN B1   Zinc Oxide 12.8 % ointment Commonly known as: TRIPLE PASTE       TAKE these medications    ascorbic acid 500 MG tablet Commonly known as: VITAMIN C Take 1 tablet (500 mg total) by mouth 2 (two) times daily.   nutrition supplement (JUVEN) Pack Take 1 packet by mouth 2 (two) times daily with a meal.   feeding supplement Liqd Take 237 mLs by mouth 2 (two) times daily between meals.   folic acid 1 MG tablet Commonly known as: FOLVITE Take 1 tablet (1 mg total) by mouth daily.   multivitamin with minerals Tabs tablet Take 1 tablet by mouth daily.   thiamine 100 MG tablet Commonly known as: Vitamin B-1 Take 1 tablet (100 mg total) by mouth daily.               Discharge Care Instructions  (From admission, onward)           Start     Ordered   08/07/22 0000  Discharge wound care:       Comments: Wound care to buttocks, inguinal and perineal areas of irritant contact dermatitis due to urinary and fecal incontinence:  Cleanse with house skin cleanser, pat dry. Apply moisture  barrier cream (Critic Aid Clean, purple topped tube) top affected areas. Apply 4 times daily and PRN soiling.  07/28/22 0601   08/07/22 1535            Follow-up Information     [facility] Follow up.   Why: [facility] physical/occupational therapy/HH nursing [address] [place] [address] [phone]                Discharge Exam: Ceasar Mons Weights   07/27/22 1430  Weight: 81.6 kg   General exam: Appears calm and comfortable  Respiratory system:  Clear to auscultation. Respiratory effort normal. Cardiovascular system: S1 & S2 heard, RRR. No JVD, No pedal edema. Gastrointestinal system: Abdomen is nondistended, soft and nontender. Normal bowel sounds heard. Central nervous system: Alert and oriented. No focal neurological deficits. Extremities: Symmetric 5 x 5 power. Skin: No rashes, lesions or ulcers Psychiatry:  Mood & affect appropriate.    Condition at discharge: fair  The results of significant diagnostics from this hospitalization (including imaging, microbiology, ancillary and laboratory) are listed below for reference.   Imaging Studies: VAS [place] UPPER EXTREMITY VENOUS DUPLEX  Result Date: 07/30/2022 UPPER VENOUS STUDY  Patient Name:  Skiler SHEA KAPUR  Date of Exam:   07/30/2022 Medical Rec #: [id number]                Accession #:    [id number] Date of Birth: 07-Sep-1966                Patient Gender: M Patient Age:   61 years Exam Location:  [facility] Procedure:      VAS [place] UPPER EXTREMITY VENOUS DUPLEX Referring Phys: JEFFREY MCCLUNG --------------------------------------------------------------------------------  Indications: Swelling Risk Factors: None identified. Comparison Study: No prior studies. Performing Technologist: Chanda Busing RVT  Examination Guidelines: A complete evaluation includes B-mode imaging, spectral Doppler, color Doppler, and power Doppler as needed of all accessible  portions of each vessel. Bilateral testing is considered an integral part of a complete examination. Limited examinations for reoccurring indications may be performed as noted.  Right Findings: +----------+------------+---------+-----------+----------+-------+ RIGHT     CompressiblePhasicitySpontaneousPropertiesSummary +----------+------------+---------+-----------+----------+-------+ Subclavian    Full       Yes       Yes                      +----------+------------+---------+-----------+----------+-------+  Left Findings: +----------+------------+---------+-----------+----------+-------+ LEFT      CompressiblePhasicitySpontaneousPropertiesSummary +----------+------------+---------+-----------+----------+-------+ IJV           Full       Yes       Yes                      +----------+------------+---------+-----------+----------+-------+ Subclavian    Full       Yes       Yes                      +----------+------------+---------+-----------+----------+-------+ Axillary      Full       Yes       Yes                      +----------+------------+---------+-----------+----------+-------+ Brachial      Full       Yes       Yes                      +----------+------------+---------+-----------+----------+-------+ Radial        Full                                          +----------+------------+---------+-----------+----------+-------+ Ulnar         Full                                          +----------+------------+---------+-----------+----------+-------+ Cephalic  Full                                          +----------+------------+---------+-----------+----------+-------+ Basilic       Full                                          +----------+------------+---------+-----------+----------+-------+  Summary:  Right: No evidence of thrombosis in the subclavian.  Left: No evidence of deep vein thrombosis in the upper extremity.  No evidence of superficial vein thrombosis in the upper extremity.  *See table(s) above for measurements and observations.  Diagnosing physician: Jamelle Haring Electronically signed by Jamelle Haring on 07/30/2022 at 2:47:30 PM.    Final    ECHOCARDIOGRAM COMPLETE  Result Date: 07/28/2022    ECHOCARDIOGRAM REPORT   Patient Name:   Dontell CHRISTOPHER Bakula Date of Exam: 07/28/2022 Medical Rec #:  [id number]               Height:       72.0 in Accession #:    [id number]              Weight:       180.0 lb Date of Birth:  Jan 02, 1966               BSA:          2.037 m Patient Age:    89 years                BP:           92/59 mmHg Patient Gender: M                       HR:           87 bpm. Exam Location:  Inpatient Procedure: 2D Echo, Cardiac Doppler and Color Doppler Indications:    Abnormal ECG  History:        Patient has prior history of Echocardiogram examinations, most                 recent 09/02/2021. Risk Factors:Current Smoker. ETOH.  Sonographer:    Wenda Low Referring Phys: [place]  1. Left ventricular ejection fraction, by estimation, is 65 to 70%. The left ventricle has normal function. The left ventricle has no regional wall motion abnormalities. Left ventricular diastolic parameters are consistent with Grade I diastolic dysfunction (impaired relaxation).  2. Right ventricular systolic function is normal. The right ventricular size is normal.  3. The mitral valve is normal in structure. No evidence of mitral valve regurgitation. No evidence of mitral stenosis.  4. The aortic valve is normal in structure. Aortic valve regurgitation is not visualized. No aortic stenosis is present.  5. The inferior vena cava is normal in size with greater than 50% respiratory variability, suggesting right atrial pressure of 3 mmHg. Comparison(s): No significant change from prior study. Prior images reviewed side by side. FINDINGS  Left Ventricle: Left ventricular ejection fraction, by  estimation, is 65 to 70%. The left ventricle has normal function. The left ventricle has no regional wall motion abnormalities. The left ventricular internal cavity size was normal in size. There is  no left ventricular hypertrophy. Left ventricular diastolic parameters are consistent with Grade I diastolic dysfunction (impaired  relaxation). Right Ventricle: The right ventricular size is normal. No increase in right ventricular wall thickness. Right ventricular systolic function is normal. Left Atrium: Left atrial size was normal in size. Right Atrium: Right atrial size was normal in size. Pericardium: There is no evidence of pericardial effusion. Mitral Valve: The mitral valve is normal in structure. No evidence of mitral valve regurgitation. No evidence of mitral valve stenosis. MV peak gradient, 2.5 mmHg. The mean mitral valve gradient is 1.0 mmHg. Tricuspid Valve: The tricuspid valve is normal in structure. Tricuspid valve regurgitation is not demonstrated. No evidence of tricuspid stenosis. Aortic Valve: The aortic valve is normal in structure. Aortic valve regurgitation is not visualized. No aortic stenosis is present. Aortic valve mean gradient measures 3.0 mmHg. Aortic valve peak gradient measures 5.7 mmHg. Aortic valve area, by VTI measures 2.99 cm. Pulmonic Valve: The pulmonic valve was normal in structure. Pulmonic valve regurgitation is not visualized. No evidence of pulmonic stenosis. Aorta: The aortic root is normal in size and structure. Venous: The inferior vena cava is normal in size with greater than 50% respiratory variability, suggesting right atrial pressure of 3 mmHg. IAS/Shunts: No atrial level shunt detected by color flow Doppler.  LEFT VENTRICLE PLAX 2D LVIDd:         4.10 cm   Diastology LVIDs:         2.30 cm   LV e' medial:   8.81 cm/s LV PW:         0.80 cm   LV E/e' medial: 7.0 LV IVS:        1.30 cm LVOT diam:     2.10 cm LV SV:         73 LV SV Index:   36 LVOT Area:     3.46 cm   RIGHT VENTRICLE RV Basal diam:  3.30 cm RV Mid diam:    3.00 cm RV S prime:     12.20 cm/s TAPSE (M-mode): 2.2 cm LEFT ATRIUM             Index        RIGHT ATRIUM           Index LA diam:        3.70 cm 1.82 cm/m   RA Area:     19.20 cm LA Vol (A2C):   58.5 ml 28.72 ml/m  RA Volume:   58.60 ml  28.76 ml/m LA Vol (A4C):   57.4 ml 28.18 ml/m LA Biplane Vol: 64.2 ml 31.51 ml/m  AORTIC VALVE                    PULMONIC VALVE AV Area (Vmax):    2.56 cm     PV Vmax:       0.82 m/s AV Area (Vmean):   2.68 cm     PV Peak grad:  2.7 mmHg AV Area (VTI):     2.99 cm AV Vmax:           119.00 cm/s AV Vmean:          81.300 cm/s AV VTI:            0.243 m AV Peak Grad:      5.7 mmHg AV Mean Grad:      3.0 mmHg LVOT Vmax:         88.10 cm/s LVOT Vmean:        62.800 cm/s LVOT VTI:          0.210  m LVOT/AV VTI ratio: 0.86  AORTA Ao Root diam: 3.60 cm MITRAL VALVE MV Area (PHT): 3.65 cm    SHUNTS MV Area VTI:   3.73 cm    Systemic VTI:  0.21 m MV Peak grad:  2.5 mmHg    Systemic Diam: 2.10 cm MV Mean grad:  1.0 mmHg MV Vmax:       0.79 m/s MV Vmean:      47.9 cm/s MV Decel Time: 208 msec MV E velocity: 61.90 cm/s MV A velocity: 51.40 cm/s MV E/A ratio:  1.20 Donato Schultz MD Electronically signed by Donato Schultz MD Signature Date/Time: 07/28/2022/3:10:29 PM    Final    CT ABDOMEN PELVIS W CONTRAST  Result Date: 07/27/2022 CLINICAL DATA:  Sepsis cellulitis to buttock and perineum.r/o gas collection EXAM: CT ABDOMEN AND PELVIS WITH CONTRAST TECHNIQUE: Multidetector CT imaging of the abdomen and pelvis was performed using the standard protocol following bolus administration of intravenous contrast. RADIATION DOSE REDUCTION: This exam was performed according to the departmental dose-optimization program which includes automated exposure control, adjustment of the mA and/or kV according to patient size and/or use of iterative reconstruction technique. CONTRAST:  OMNIPAQUE IOHEXOL 300 MG/ML  SOLN COMPARISON:  None  Available. FINDINGS: Lower chest: Indeterminate subpleural ground-glass airspace opacity within the right middle lobe (4:36.). Trace hiatal hernia. Hepatobiliary: The hepatic parenchyma is diffusely hypodense compared to the splenic parenchyma consistent with fatty infiltration. No focal liver abnormality. Calcified stones within the gallbladder lumen. No gallbladder wall thickening or pericholecystic fluid. No biliary dilatation. Pancreas: No focal lesion. Normal pancreatic contour. No surrounding inflammatory changes. No main pancreatic ductal dilatation. Spleen: Normal in size without focal abnormality. Adrenals/Urinary Tract: No adrenal nodule bilaterally. Bilateral kidneys enhance symmetrically. No hydronephrosis. No hydroureter. The urinary bladder is unremarkable. Stomach/Bowel: Stomach is within normal limits. No evidence of bowel wall thickening or dilatation. Colonic diverticulosis. Fatty infiltration of the wall of the appendix and ascending colon likely due to chronic inflammatory changes. The appendix is enlarged in caliber measuring up to 1 cm-nonspecific. No associated periappendiceal fat stranding or fluid. No appendicoliths. Gas is noted within the lumen of the appendix. Vascular/Lymphatic: No abdominal aorta or iliac aneurysm. Mild atherosclerotic plaque of the aorta and its branches. No abdominal, pelvic, or inguinal lymphadenopathy. Reproductive: Prostate is unremarkable. Other: No intraperitoneal free fluid. No intraperitoneal free gas. No organized fluid collection. Musculoskeletal: Bilateral gynecomastia. No subcutaneus soft tissue edema, emphysema, or organized fluid collection of the perineum. Scrotum only partially visualized. Tiny fat containing umbilical hernia. No suspicious lytic or blastic osseous lesions. No acute displaced fracture. Multilevel degenerative changes of the spine. Old healed left rib fractures. IMPRESSION: 1. No subcutaneus soft tissue edema, emphysema, or organized  fluid collection of the perineum. Scrotum only partially visualized. 2. Indeterminate subpleural ground-glass airspace opacity within the right middle lobe. 3. Trace hiatal hernia. 4. Hepatic steatosis. 5. Cholelithiasis with no CT evidence of acute cholecystitis. 6. Colonic diverticulosis with no acute diverticulitis. 7.  Aortic Atherosclerosis (ICD10-I70.0). Electronically Signed   By: Tish Frederickson M.D.   On: 07/27/2022 18:09   CT Head Wo Contrast  Result Date: 07/27/2022 CLINICAL DATA:  Altered mental status. EXAM: CT HEAD WITHOUT CONTRAST TECHNIQUE: Contiguous axial images were obtained from the base of the skull through the vertex without intravenous contrast. RADIATION DOSE REDUCTION: This exam was performed according to the departmental dose-optimization program which includes automated exposure control, adjustment of the mA and/or kV according to patient size and/or use of iterative reconstruction  technique. COMPARISON:  Head CT dated 04/25/2022. FINDINGS: Brain: Mild age-related atrophy and chronic microvascular ischemic changes. There is no acute intracranial hemorrhage. No mass effect or midline shift. No extra-axial fluid collection. Vascular: No hyperdense vessel or unexpected calcification. Skull: Normal. Negative for fracture or focal lesion. Sinuses/Orbits: Partially visualized left maxillary sinus retention cyst or polyp. The remainder of the visualized paranasal sinuses and mastoid air cells are clear. Other: None IMPRESSION: 1. No acute intracranial pathology. 2. Mild age-related atrophy and chronic microvascular ischemic changes. Electronically Signed   By: Elgie Collard M.D.   On: 07/27/2022 18:03   DG Chest 2 View  Result Date: 07/27/2022 CLINICAL DATA:  Altered level of consciousness EXAM: CHEST - 2 VIEW COMPARISON:  04/25/2022 FINDINGS: Frontal and lateral views of the chest demonstrate an unremarkable cardiac silhouette. No acute airspace disease, effusion, or pneumothorax.  Chronic L1 compression deformity. No acute bony abnormality. IMPRESSION: 1. No acute intrathoracic process. Electronically Signed   By: Sharlet Salina M.D.   On: 07/27/2022 15:51    Microbiology: Results for orders placed or performed during the hospital encounter of 07/27/22  Blood culture (routine x 2)     Status: None   Collection Time: 07/27/22  3:50 PM   Specimen: BLOOD  Result Value Ref Range Status   Specimen Description   Final    BLOOD BLOOD RIGHT WRIST Performed at [facility], [address] [place], [place] [address]    Special Requests   Final    BOTTLES DRAWN AEROBIC AND ANAEROBIC Blood Culture results may not be optimal due to an inadequate volume of blood received in culture bottles Performed at [facility] [address] [place], [place] [address]    Culture   Final    NO GROWTH 5 DAYS Performed at [facility], [address], [place], [place] [address]    Report Status 08/01/2022 FINAL  Final  Blood culture (routine x 2)     Status: None   Collection Time: 07/27/22  5:30 PM   Specimen: BLOOD  Result Value Ref Range Status   Specimen Description   Final    BLOOD LEFT ANTECUBITAL Performed at [facility], [address], [place], [place] [address]    Special Requests   Final    BOTTLES DRAWN AEROBIC AND ANAEROBIC Blood Culture adequate volume Performed at [facility], [address], [place], [place] [address]    Culture   Final    NO GROWTH 5 DAYS Performed at [facility], [address] [place], [place] [address]    Report Status 08/01/2022 FINAL  Final  MRSA Next Gen by PCR, Nasal     Status: None   Collection Time: 07/27/22  7:40 PM   Specimen: Nasal Mucosa; Nasal Swab  Result Value Ref Range Status   MRSA by PCR Next Gen NOT DETECTED NOT DETECTED Final    Comment: (NOTE) The GeneXpert MRSA Assay (FDA approved for NASAL specimens only), is one component of  a comprehensive MRSA colonization surveillance program. It is not intended to diagnose MRSA infection nor to guide or monitor treatment for MRSA infections. Test performance is not FDA approved in patients less than 49 years old. Performed at [facility], [address] [place], [place] [address]   Urine Culture     Status: Abnormal   Collection Time: 07/28/22  6:42 AM   Specimen: Urine, Catheterized  Result Value Ref Range  Status   Specimen Description   Final    URINE, CATHETERIZED Performed at [facility], [address] [place], [place] [address]    Special Requests   Final    NONE Performed at [facility], [address] [place], [place] [address]    Culture (A)  Final    <10,000 COLONIES/mL INSIGNIFICANT GROWTH Performed at [facility], [address] [place], [place] [address]    Report Status 07/29/2022 FINAL  Final    Labs: CBC: Recent Labs  Lab 08/06/22 0325  WBC 9.0  NEUTROABS 5.9  HGB 10.6*  HCT 33.0*  MCV 97.1  PLT 254    Basic Metabolic Panel: Recent Labs  Lab 08/06/22 0325  NA 131*  K 4.1  CL 104  CO2 21*  GLUCOSE 93  BUN 12  CREATININE 0.61  CALCIUM 8.3*    Liver Function Tests: No results for input(s): "AST", "ALT", "ALKPHOS", "BILITOT", "PROT", "ALBUMIN" in the last 168 hours. CBG: No results for input(s): "GLUCAP" in the last 168 hours.  Discharge time spent: 35 minutes.   Signed: Kathlen ModyVijaya Jaliana Medellin, MD [facility] 08/08/2022

## 2022-08-08 NOTE — Plan of Care (Signed)
  Problem: Activity: Goal: Risk for activity intolerance will decrease Outcome: Progressing   Problem: Pain Managment: Goal: General experience of comfort will improve Outcome: Progressing   Problem: Safety: Goal: Ability to remain free from injury will improve Outcome: Progressing   

## 2022-08-09 DIAGNOSIS — D638 Anemia in other chronic diseases classified elsewhere: Secondary | ICD-10-CM

## 2022-08-09 DIAGNOSIS — F10931 Alcohol use, unspecified with withdrawal delirium: Secondary | ICD-10-CM

## 2022-08-09 DIAGNOSIS — E440 Moderate protein-calorie malnutrition: Secondary | ICD-10-CM

## 2022-08-09 DIAGNOSIS — L8996 Pressure-induced deep tissue damage of unspecified site: Secondary | ICD-10-CM

## 2022-08-09 NOTE — TOC Transition Note (Signed)
Transition of Care [facility]) - CM/SW Discharge Note   Patient Details  Name: Dylan Weaver MRN: [id number] Date of Birth: 1966-07-03  Transition of Care [facility]) CM/SW Contact:  Amada Jupiter, LCSW Phone Number: 08/09/2022, 11:55 AM   Clinical Narrative:    Have received [facility] approval for ambulance transport via [facility] who are on site to pick up at this time.  Pt admitting to [facility].  RN to call report to [phone]. Pt and aunt aware/ agreeable.  No further TOC needs.   Final next level of care: Skilled Nursing Facility Barriers to Discharge: Barriers Resolved   Patient Goals and CMS Choice Patient states their goals for this hospitalization and ongoing recovery are::  (Home) CMS Medicare.gov Compare Post Acute Care list provided to:: Patient Choice offered to / list presented to : Patient  Discharge Placement              Patient chooses bed at: Other - please specify in the comment section below: [facility] [place], [place] [phone])) Patient to be transferred to facility by: [facility] Name of family member notified: aunt, Marcelline Deist Patient and family notified of of transfer: 08/09/22  Discharge Plan and Services   Discharge Planning Services: CM Consult Post Acute Care Choice: Home Health                      [facility] Agency: [facility] Date [facility] Agency Contacted: 07/28/22 Time HH Agency Contacted: 1425 Representative spoke with at [facility] Agency:  Morrie Sheldon)  Social Determinants of Health (SDOH) Interventions     Readmission Risk Interventions    07/28/2022    2:36 PM  Readmission Risk Prevention Plan  Transportation Screening Complete  PCP or Specialist Appt within 3-5 Days Complete  HRI or Home Care Consult Complete  Social Work Consult for Recovery Care Planning/Counseling Complete  Palliative Care Screening Not Applicable  Medication Review Ocean[employer]) Complete

## 2022-08-09 NOTE — Progress Notes (Signed)
Occupational Therapy Treatment Patient Details Name: Dylan Weaver MRN: [id number] DOB: 1966/04/15 Today's Date: 08/09/2022   History of present illness Branon Petrosino is a 56 y.o. male with medical history significant of  alcohol abuse, history of enterococcus bacteremia and ulcers in the past.  Presented with buttock pain. Patient with known history of alcohol abuse states that he drove here from of [place] patient is actively hallucinating admits to drinking at least 6-12 beers per day have not been having any alcohol for past 3 days.  Reports that he has sustained a fall on his buttocks. Was noted to be covered in feces and disheveled.  There is redness of his buttocks consistent with likely cellulitis.  Patient is hallucinating stating that he has slipped on ice  Admits to alcohol abuse   OT comments  Patient was educated on sequencing of sit to stand to reduce posterior leaning during transitions. Patient was able to progress from min A to min guard with repetition from recliner in room. Patient would continue to benefit from skilled OT services at this time while admitted and after d/c to address noted deficits in order to improve overall safety and independence in ADLs.     Recommendations for follow up therapy are one component of a multi-disciplinary discharge planning process, led by the attending physician.  Recommendations may be updated based on patient status, additional functional criteria and insurance authorization.    Follow Up Recommendations  Skilled nursing-short term rehab (<3 hours/day)    Assistance Recommended at Discharge Frequent or constant Supervision/Assistance  Patient can return home with the following  A lot of help with walking and/or transfers;A lot of help with bathing/dressing/bathroom;Assistance with cooking/housework;Direct supervision/assist for medications management;Direct supervision/assist for financial management;Assist for  transportation;Help with stairs or ramp for entrance   Equipment Recommendations  None recommended by OT    Recommendations for Other Services      Precautions / Restrictions Precautions Precautions: Fall Restrictions Weight Bearing Restrictions: No       Mobility Bed Mobility                    Transfers                         Balance                                           ADL either performed or assessed with clinical judgement   ADL Overall ADL's : Needs assistance/impaired     Grooming: Sitting;Set up;Wash/dry face;Wash/dry hands;Oral care Grooming Details (indicate cue type and reason): sitting on EOB with no LOB with increased time.                 Toilet Transfer: Minimal assistance;Ambulation;Rolling walker (2 wheels) Toilet Transfer Details (indicate cue type and reason): to transfer from edge of bed to recliner in room.                Extremity/Trunk Assessment              Vision       Perception     Praxis      Cognition Arousal/Alertness: Awake/alert Behavior During Therapy: WFL for tasks assessed/performed Overall Cognitive Status: Impaired/Different from baseline  General Comments: noted to have slow processing for cues but able to participate appropirately.        Exercises Other Exercises Other Exercises: patient was educated on sit to stand sequencing with patient able to carryover with increased cues with min guard with pushing up from recliner 5 reps x2 sets.    Shoulder Instructions       General Comments      Pertinent Vitals/ Pain       Pain Assessment Pain Assessment: No/denies pain  Home Living                                          Prior Functioning/Environment              Frequency  Min 2X/week        Progress Toward Goals  OT Goals(current goals can now be found in the care plan  section)  Progress towards OT goals: Progressing toward goals     Plan Discharge plan remains appropriate    Co-evaluation                 AM-PAC OT "6 Clicks" Daily Activity     Outcome Measure   Help from another person eating meals?: None Help from another person taking care of personal grooming?: A Little Help from another person toileting, which includes using toliet, bedpan, or urinal?: A Lot Help from another person bathing (including washing, rinsing, drying)?: A Lot Help from another person to put on and taking off regular upper body clothing?: A Little Help from another person to put on and taking off regular lower body clothing?: A Lot 6 Click Score: 16    End of Session Equipment Utilized During Treatment: Gait belt;Rolling walker (2 wheels)  OT Visit Diagnosis: Unsteadiness on feet (R26.81);Other abnormalities of gait and mobility (R26.89)   Activity Tolerance Patient tolerated treatment well   Patient Left in chair;with call bell/phone within reach;with chair alarm set   Nurse Communication Mobility status        Time: 5597-4163 OT Time Calculation (min): 25 min  Charges: OT General Charges $OT Visit: 1 Visit OT Treatments $Self Care/Home Management : 8-22 mins $Therapeutic Activity: 8-22 mins  Rosalio Loud, MS Acute Rehabilitation Department Office# [phone]   Ardyth Harps 08/09/2022, 10:50 AM

## 2022-08-09 NOTE — Discharge Summary (Signed)
Physician Discharge Summary  Dylan Weaver Dylan Weaver ONG:[id number] DOB: 1966-01-26 DOA: 07/27/2022  [facility]  Admit date: 07/27/2022 Discharge date: 08/09/2022  Time spent: 60 minutes  Recommendations for Outpatient Follow-up:  Patient will be discharged to skilled nursing facility.  Follow-up with MD at SNF.  Patient will need a comprehensive metabolic profile, magnesium level checked in 1 week to follow-up on electrolytes and renal function.  Patient need a CBC done to follow-up on counts.   Discharge Diagnoses:  Active Problems:   Alcohol use disorder with alcohol withdrawal    QT prolongation   Protein-calorie malnutrition, moderate (HCC)   Hyponatremia   Hypokalemia   Cellulitis   Pressure injury of skin   Anemia of chronic disease   Discharge Condition: Stable and improved.  Diet recommendation: Heart healthy  Filed Weights   07/27/22 1430  Weight: 81.6 kg    History of present illness:  HPI per Dr. Susann Givensoutova Dylan Weaver is a 56 y.o. male with medical history significant of  Alcohol abuse, history of Enterococcus bacteremia and ulcers in the past     Presented with   buttock pain Patient with known history of alcohol abuse [place] is actively hallucinating admits to drinking at least 6-12 beers per day have not been having any alcohol for past 3 days.  Reports that he has sustained a fall on his buttocks Was noted to be covered in feces and disheveled. There is redness of his buttocks consistent with likely cellulitis. Patient is hallucinating stating that he has slipped on ice Admits to alcohol abuse     Chronic anemia - baseline hg Hemoglobin & Hematocrit  Recent Labs (within last 365 days)       Recent Labs    05/09/22 0813 05/10/22 0357 07/27/22 1550  HGB 9.7* 9.5* 11.2*        While in ER:  CT pelvis showed no evidence of free air or abscess Noted to have CIWA score of at  least 2 and started on CIWA protocol.  Start vancomycin for Cellulitis   Ordered   CT HEAD   NON acute   CXR - No acute intrathoracic process.   CTabd/pelvis - . No subcutaneus soft tissue edema, emphysema, or organized fluid collection of the perineum. Scrotum only partially visualized. 2. Indeterminate subpleural ground-glass airspace opacity within the right middle lobe. 3. Trace hiatal hernia. 4. Hepatic steatosis. 5. Cholelithiasis with no CT evidence of acute cholecystitis. 6. Colonic diverticulosis with no acute diverticulitis.   Following Medications were ordered in ER: Medications  LORazepam (ATIVAN) tablet 1-4 mg (has no administration in time range)    Or  LORazepam (ATIVAN) injection 1-4 mg (has no administration in time range)  thiamine (VITAMIN B1) tablet 100 mg ( Oral See Alternative 07/27/22 1603)    Or  thiamine (VITAMIN B1) injection 100 mg (100 mg Intravenous Given 07/27/22 1603)  folic acid (FOLVITE) tablet 1 mg (1 mg Oral Given 07/27/22 1648)  multivitamin with minerals tablet 1 tablet (1 tablet Oral Given 07/27/22 1651)  LORazepam (ATIVAN) injection 0-4 mg (0 mg Intravenous Hold 07/27/22 1650)    Followed by  LORazepam (ATIVAN) injection 0-4 mg (has no administration in time range)  potassium chloride SA (KLOR-CON M) CR tablet 40 mEq (has no administration in time range)  lactated ringers bolus 1,000 mL (0 mLs Intravenous Stopped 07/27/22 1838)  vancomycin (VANCOCIN) IVPB 1000 mg/200 mL premix (0 mg Intravenous Stopped 07/27/22 1736)  iohexol (OMNIPAQUE) 300 MG/ML solution 100 mL (100 mLs Intravenous Contrast Given 07/27/22 1751)    Hospital Course:  #1 acute alcohol withdrawal with delirium -Patient had presented acutely confused felt secondary to acute alcohol withdrawal after abstaining for 3 days prior to admission. 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 -Head CT done was in ED for any acute abnormalities. -Patient placed on the  Ativan withdrawal protocol, thiamine, folic acid, multivitamin and completed the course. -Patient stabilized improved was pleasant and comfortable and had no further signs of agitation or withdrawal for the rest of the hospitalization.  2.  Mild transaminitis -Likely secondary to alcohol use. -CT abdomen and pelvis done with hepatic parenchyma diffusely hypodense compared to splenic parenchyma consistent with fatty infiltration.  No focal liver abnormality noted.  Calcified stones within the gallbladder lumen.  No gallbladder wall thickening or pericholecystic fluid.  No biliary dilatation.  No focal lesion noted of the pancreas. -Transaminitis trended down during the hospitalization. -Outpatient follow-up.  3.  Irritant contact dermatitis of the perineum, POA -Cellulitis ruled out during this hospitalization and felt likely secondary to poor hygiene. -CT abdomen and pelvis done no signs of infection no fluid collection or abscess noted. -Felt secondary to poor hygiene. -Patient seen by wound care RN with no signs of infection and dressing changes recommended. -Patient improved clinically during the hospitalization. -Outpatient follow-up. Pressure Injury 08/03/22 Vertebral column Medial Stage 2 -  Partial thickness loss of dermis presenting as a shallow open injury with a red, pink wound bed without slough. (Active)  08/03/22 2221  Location: Vertebral column  Location Orientation: Medial  Staging: Stage 2 -  Partial thickness loss of dermis presenting as a shallow open injury with a red, pink wound bed without slough.  Wound Description (Comments):   Present on Admission:       4.  Hyponatremia -Stabilized in the absence of alcohol use. -Sodium level stabilized at 131.  Felt likely to be his baseline. -Outpatient follow-up.  5.  Hypokalemia/hypomagnesemia -Repleted during the hospitalization.  6.  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  7.  Left upper extremity edema -Venous duplex done was negative for DVT felt likely secondary to infiltration of IV and improved during the hospitalization.  8.  Moderate malnutrition secondary to social and environmental circumstance and alcohol abuse -Patient's appetite improved during the hospitalization. -Patient maintained on nutritional supplementation.   Procedures: CT abdomen and pelvis 07/27/2022 CT head 07/27/2022 Chest x-ray 07/27/2022 2D echo 07/28/2022 Upper extremity duplex 07/30/2022  Consultations: Wound care: Ria Bush, RN 07/28/2022  Discharge Exam: Vitals:   08/08/22 2103 08/09/22 0505  BP: 96/64 93/60  Pulse: 83 82  Resp: 18 18  Temp: 98.6 F (37 C) 98.3 F (36.8 C)  SpO2: 97% 100%    General: NAD Cardiovascular: RRR no murmurs rubs or gallops.  No JVD.  No lower extremity edema. Respiratory: Clear to auscultation bilaterally.  No wheezes, no crackles, no rhonchi.  Fair air movement.  Speaking in full sentences.  Discharge Instructions   Discharge Instructions     Diet - low sodium heart healthy   Complete by: As directed    Discharge wound care:   Complete by: As directed    Wound care to buttocks, inguinal and perineal areas of irritant contact dermatitis due to urinary and fecal incontinence:  Cleanse with house skin cleanser, pat dry. Apply moisture barrier cream (Critic Aid  Clean, purple topped tube) top affected areas. Apply 4 times daily and PRN soiling.  07/28/22 0601   Increase activity slowly   Complete by: As directed       Allergies as of 08/09/2022       Reactions   Other Shortness Of Breath, Swelling, Other (See Comments)   Pine nuts- I became swollen all over when I was young from these"   Midodrine Other (See Comments)   Patient stated it makes him fell unwell so he does not take it        Medication List     STOP taking these  medications    ferrous sulfate 325 (65 FE) MG tablet   thiamine 100 MG tablet Commonly known as: VITAMIN B1   Zinc Oxide 12.8 % ointment Commonly known as: TRIPLE PASTE       TAKE these medications    ascorbic acid 500 MG tablet Commonly known as: VITAMIN C Take 1 tablet (500 mg total) by mouth 2 (two) times daily.   nutrition supplement (JUVEN) Pack Take 1 packet by mouth 2 (two) times daily with a meal.   feeding supplement Liqd Take 237 mLs by mouth 2 (two) times daily between meals.   folic acid 1 MG tablet Commonly known as: FOLVITE Take 1 tablet (1 mg total) by mouth daily.   multivitamin with minerals Tabs tablet Take 1 tablet by mouth daily.   thiamine 100 MG tablet Commonly known as: Vitamin B-1 Take 1 tablet (100 mg total) by mouth daily.               Discharge Care Instructions  (From admission, onward)           Start     Ordered   08/07/22 0000  Discharge wound care:       Comments: Wound care to buttocks, inguinal and perineal areas of irritant contact dermatitis due to urinary and fecal incontinence:  Cleanse with house skin cleanser, pat dry. Apply moisture barrier cream (Critic Aid Clean, purple topped tube) top affected areas. Apply 4 times daily and PRN soiling.  07/28/22 0601   08/07/22 1535           Allergies  Allergen Reactions   Other Shortness Of Breath, Swelling and Other (See Comments)    Pine nuts- I became swollen all over when I was young from these"   Midodrine Other (See Comments)    Patient stated it makes him fell unwell so he does not take it    Follow-up Information     [facility] Follow up.   Why: [facility] physical/occupational therapy/HH nursing Contact information: [address] [place] [address] [phone]         MD AT SNF Follow up in 1 week(s).                   The results of significant diagnostics from this hospitalization (including imaging,  microbiology, ancillary and laboratory) are listed below for reference.    Significant Diagnostic Studies: VAS [place] UPPER EXTREMITY VENOUS DUPLEX  Result Date: 07/30/2022 UPPER VENOUS STUDY  Patient Name:  Dylan Weaver  Date of Exam:   07/30/2022 Medical Rec #: [id number]                Accession #:    [id number] Date of Birth: [birth year]                Patient Gender: M Patient  Age:   35 years Exam Location:  [facility] Procedure:      VAS [place] UPPER EXTREMITY VENOUS DUPLEX Referring Phys: JEFFREY MCCLUNG --------------------------------------------------------------------------------  Indications: Swelling Risk Factors: None identified. Comparison Study: No prior studies. Performing Technologist: Chanda Busing RVT  Examination Guidelines: A complete evaluation includes B-mode imaging, spectral Doppler, color Doppler, and power Doppler as needed of all accessible portions of each vessel. Bilateral testing is considered an integral part of a complete examination. Limited examinations for reoccurring indications may be performed as noted.  Right Findings: +----------+------------+---------+-----------+----------+-------+ RIGHT     CompressiblePhasicitySpontaneousPropertiesSummary +----------+------------+---------+-----------+----------+-------+ Subclavian    Full       Yes       Yes                      +----------+------------+---------+-----------+----------+-------+  Left Findings: +----------+------------+---------+-----------+----------+-------+ LEFT      CompressiblePhasicitySpontaneousPropertiesSummary +----------+------------+---------+-----------+----------+-------+ IJV           Full       Yes       Yes                      +----------+------------+---------+-----------+----------+-------+ Subclavian    Full       Yes       Yes                      +----------+------------+---------+-----------+----------+-------+ Axillary      Full       Yes        Yes                      +----------+------------+---------+-----------+----------+-------+ Brachial      Full       Yes       Yes                      +----------+------------+---------+-----------+----------+-------+ Radial        Full                                          +----------+------------+---------+-----------+----------+-------+ Ulnar         Full                                          +----------+------------+---------+-----------+----------+-------+ Cephalic      Full                                          +----------+------------+---------+-----------+----------+-------+ Basilic       Full                                          +----------+------------+---------+-----------+----------+-------+  Summary:  Right: No evidence of thrombosis in the subclavian.  Left: No evidence of deep vein thrombosis in the upper extremity. No evidence of superficial vein thrombosis in the upper extremity.  *See table(s) above for measurements and observations.  Diagnosing physician: Heath Lark Electronically signed by Heath Lark on 07/30/2022 at 2:47:30 PM.    Final    ECHOCARDIOGRAM COMPLETE  Result  Date: 07/28/2022    ECHOCARDIOGRAM REPORT   Patient Name:   Dylan Weaver Date of Exam: 07/28/2022 Medical Rec #:  [id number]               Height:       72.0 in Accession #:    [id number]              Weight:       180.0 lb Date of Birth:  [birth year]               BSA:          2.037 m Patient Age:    56 years                BP:           92/59 mmHg Patient Gender: M                       HR:           87 bpm. Exam Location:  Inpatient Procedure: 2D Echo, Cardiac Doppler and Color Doppler Indications:    Abnormal ECG  History:        Patient has prior history of Echocardiogram examinations, most                 recent 09/02/2021. Risk Factors:Current Smoker. ETOH.  Sonographer:    Mikki Harbor Referring Phys: [id number] ANASTASSIA DOUTOVA IMPRESSIONS  1.  Left ventricular ejection fraction, by estimation, is 65 to 70%. The left ventricle has normal function. The left ventricle has no regional wall motion abnormalities. Left ventricular diastolic parameters are consistent with Grade I diastolic dysfunction (impaired relaxation).  2. Right ventricular systolic function is normal. The right ventricular size is normal.  3. The mitral valve is normal in structure. No evidence of mitral valve regurgitation. No evidence of mitral stenosis.  4. The aortic valve is normal in structure. Aortic valve regurgitation is not visualized. No aortic stenosis is present.  5. The inferior vena cava is normal in size with greater than 50% respiratory variability, suggesting right atrial pressure of 3 mmHg. Comparison(s): No significant change from prior study. Prior images reviewed side by side. FINDINGS  Left Ventricle: Left ventricular ejection fraction, by estimation, is 65 to 70%. The left ventricle has normal function. The left ventricle has no regional wall motion abnormalities. The left ventricular internal cavity size was normal in size. There is  no left ventricular hypertrophy. Left ventricular diastolic parameters are consistent with Grade I diastolic dysfunction (impaired relaxation). Right Ventricle: The right ventricular size is normal. No increase in right ventricular wall thickness. Right ventricular systolic function is normal. Left Atrium: Left atrial size was normal in size. Right Atrium: Right atrial size was normal in size. Pericardium: There is no evidence of pericardial effusion. Mitral Valve: The mitral valve is normal in structure. No evidence of mitral valve regurgitation. No evidence of mitral valve stenosis. MV peak gradient, 2.5 mmHg. The mean mitral valve gradient is 1.0 mmHg. Tricuspid Valve: The tricuspid valve is normal in structure. Tricuspid valve regurgitation is not demonstrated. No evidence of tricuspid stenosis. Aortic Valve: The aortic valve is  normal in structure. Aortic valve regurgitation is not visualized. No aortic stenosis is present. Aortic valve mean gradient measures 3.0 mmHg. Aortic valve peak gradient measures 5.7 mmHg. Aortic valve area, by VTI measures 2.99 cm. Pulmonic Valve: The pulmonic valve was normal in structure. Pulmonic valve regurgitation is not visualized. No  evidence of pulmonic stenosis. Aorta: The aortic root is normal in size and structure. Venous: The inferior vena cava is normal in size with greater than 50% respiratory variability, suggesting right atrial pressure of 3 mmHg. IAS/Shunts: No atrial level shunt detected by color flow Doppler.  LEFT VENTRICLE PLAX 2D LVIDd:         4.10 cm   Diastology LVIDs:         2.30 cm   LV e' medial:   8.81 cm/s LV PW:         0.80 cm   LV E/e' medial: 7.0 LV IVS:        1.30 cm LVOT diam:     2.10 cm LV SV:         73 LV SV Index:   36 LVOT Area:     3.46 cm  RIGHT VENTRICLE RV Basal diam:  3.30 cm RV Mid diam:    3.00 cm RV S prime:     12.20 cm/s TAPSE (M-mode): 2.2 cm LEFT ATRIUM             Index        RIGHT ATRIUM           Index LA diam:        3.70 cm 1.82 cm/m   RA Area:     19.20 cm LA Vol (A2C):   58.5 ml 28.72 ml/m  RA Volume:   58.60 ml  28.76 ml/m LA Vol (A4C):   57.4 ml 28.18 ml/m LA Biplane Vol: 64.2 ml 31.51 ml/m  AORTIC VALVE                    PULMONIC VALVE AV Area (Vmax):    2.56 cm     PV Vmax:       0.82 m/s AV Area (Vmean):   2.68 cm     PV Peak grad:  2.7 mmHg AV Area (VTI):     2.99 cm AV Vmax:           119.00 cm/s AV Vmean:          81.300 cm/s AV VTI:            0.243 m AV Peak Grad:      5.7 mmHg AV Mean Grad:      3.0 mmHg LVOT Vmax:         88.10 cm/s LVOT Vmean:        62.800 cm/s LVOT VTI:          0.210 m LVOT/AV VTI ratio: 0.86  AORTA Ao Root diam: 3.60 cm MITRAL VALVE MV Area (PHT): 3.65 cm    SHUNTS MV Area VTI:   3.73 cm    Systemic VTI:  0.21 m MV Peak grad:  2.5 mmHg    Systemic Diam: 2.10 cm MV Mean grad:  1.0 mmHg MV Vmax:       0.79  m/s MV Vmean:      47.9 cm/s MV Decel Time: 208 msec MV E velocity: 61.90 cm/s MV A velocity: 51.40 cm/s MV E/A ratio:  1.20 Donato Schultz MD Electronically signed by Donato Schultz MD Signature Date/Time: 07/28/2022/3:10:29 PM    Final    CT ABDOMEN PELVIS W CONTRAST  Result Date: 07/27/2022 CLINICAL DATA:  Sepsis cellulitis to buttock and perineum.r/o gas collection EXAM: CT ABDOMEN AND PELVIS WITH CONTRAST TECHNIQUE: Multidetector CT imaging of the abdomen and pelvis was performed using the standard protocol following bolus  administration of intravenous contrast. RADIATION DOSE REDUCTION: This exam was performed according to the departmental dose-optimization program which includes automated exposure control, adjustment of the mA and/or kV according to patient size and/or use of iterative reconstruction technique. CONTRAST:  OMNIPAQUE IOHEXOL 300 MG/ML  SOLN COMPARISON:  None Available. FINDINGS: Lower chest: Indeterminate subpleural ground-glass airspace opacity within the right middle lobe (4:36.). Trace hiatal hernia. Hepatobiliary: The hepatic parenchyma is diffusely hypodense compared to the splenic parenchyma consistent with fatty infiltration. No focal liver abnormality. Calcified stones within the gallbladder lumen. No gallbladder wall thickening or pericholecystic fluid. No biliary dilatation. Pancreas: No focal lesion. Normal pancreatic contour. No surrounding inflammatory changes. No main pancreatic ductal dilatation. Spleen: Normal in size without focal abnormality. Adrenals/Urinary Tract: No adrenal nodule bilaterally. Bilateral kidneys enhance symmetrically. No hydronephrosis. No hydroureter. The urinary bladder is unremarkable. Stomach/Bowel: Stomach is within normal limits. No evidence of bowel wall thickening or dilatation. Colonic diverticulosis. Fatty infiltration of the wall of the appendix and ascending colon likely due to chronic inflammatory changes. The appendix is enlarged in  caliber measuring up to 1 cm-nonspecific. No associated periappendiceal fat stranding or fluid. No appendicoliths. Gas is noted within the lumen of the appendix. Vascular/Lymphatic: No abdominal aorta or iliac aneurysm. Mild atherosclerotic plaque of the aorta and its branches. No abdominal, pelvic, or inguinal lymphadenopathy. Reproductive: Prostate is unremarkable. Other: No intraperitoneal free fluid. No intraperitoneal free gas. No organized fluid collection. Musculoskeletal: Bilateral gynecomastia. No subcutaneus soft tissue edema, emphysema, or organized fluid collection of the perineum. Scrotum only partially visualized. Tiny fat containing umbilical hernia. No suspicious lytic or blastic osseous lesions. No acute displaced fracture. Multilevel degenerative changes of the spine. Old healed left rib fractures. IMPRESSION: 1. No subcutaneus soft tissue edema, emphysema, or organized fluid collection of the perineum. Scrotum only partially visualized. 2. Indeterminate subpleural ground-glass airspace opacity within the right middle lobe. 3. Trace hiatal hernia. 4. Hepatic steatosis. 5. Cholelithiasis with no CT evidence of acute cholecystitis. 6. Colonic diverticulosis with no acute diverticulitis. 7.  Aortic Atherosclerosis (ICD10-I70.0). Electronically Signed   By: Tish Frederickson M.D.   On: 07/27/2022 18:09   CT Head Wo Contrast  Result Date: 07/27/2022 CLINICAL DATA:  Altered mental status. EXAM: CT HEAD WITHOUT CONTRAST TECHNIQUE: Contiguous axial images were obtained from the base of the skull through the vertex without intravenous contrast. RADIATION DOSE REDUCTION: This exam was performed according to the departmental dose-optimization program which includes automated exposure control, adjustment of the mA and/or kV according to patient size and/or use of iterative reconstruction technique. COMPARISON:  Head CT dated 04/25/2022. FINDINGS: Brain: Mild age-related atrophy and chronic microvascular  ischemic changes. There is no acute intracranial hemorrhage. No mass effect or midline shift. No extra-axial fluid collection. Vascular: No hyperdense vessel or unexpected calcification. Skull: Normal. Negative for fracture or focal lesion. Sinuses/Orbits: Partially visualized left maxillary sinus retention cyst or polyp. The remainder of the visualized paranasal sinuses and mastoid air cells are clear. Other: None IMPRESSION: 1. No acute intracranial pathology. 2. Mild age-related atrophy and chronic microvascular ischemic changes. Electronically Signed   By: Elgie Collard M.D.   On: 07/27/2022 18:03   DG Chest 2 View  Result Date: 07/27/2022 CLINICAL DATA:  Altered level of consciousness EXAM: CHEST - 2 VIEW COMPARISON:  04/25/2022 FINDINGS: Frontal and lateral views of the chest demonstrate an unremarkable cardiac silhouette. No acute airspace disease, effusion, or pneumothorax. Chronic L1 compression deformity. No acute bony abnormality. IMPRESSION: 1. No acute intrathoracic  process. Electronically Signed   By: Sharlet Salina M.D.   On: 07/27/2022 15:51    Microbiology: No results found for this or any previous visit (from the past 240 hour(s)).   Labs: Basic Metabolic Panel: Recent Labs  Lab 08/06/22 0325  NA 131*  K 4.1  CL 104  CO2 21*  GLUCOSE 93  BUN 12  CREATININE 0.61  CALCIUM 8.3*   Liver Function Tests: No results for input(s): "AST", "ALT", "ALKPHOS", "BILITOT", "PROT", "ALBUMIN" in the last 168 hours. No results for input(s): "LIPASE", "AMYLASE" in the last 168 hours. No results for input(s): "AMMONIA" in the last 168 hours. CBC: Recent Labs  Lab 08/06/22 0325  WBC 9.0  NEUTROABS 5.9  HGB 10.6*  HCT 33.0*  MCV 97.1  PLT 254   Cardiac Enzymes: No results for input(s): "CKTOTAL", "CKMB", "CKMBINDEX", "TROPONINI" in the last 168 hours. BNP: BNP (last 3 results) Recent Labs    08/25/21 1805  BNP 25.8    ProBNP (last 3 results) No results for input(s):  "PROBNP" in the last 8760 hours.  CBG: No results for input(s): "GLUCAP" in the last 168 hours.     Signed:  Ramiro Harvest MD.  [facility] 08/09/2022, 11:16 AM

## 2022-11-02 DEATH — deceased

## 2023-01-24 ENCOUNTER — Other Ambulatory Visit (HOSPITAL_COMMUNITY): Payer: Self-pay
